# Patient Record
Sex: Male | Born: 1950
Health system: Southern US, Community
[De-identification: ages and names within clinical notes are randomized; demographics above are authoritative.]

## PROBLEM LIST (undated history)

## (undated) DIAGNOSIS — Z923 Personal history of irradiation: Secondary | ICD-10-CM

## (undated) DIAGNOSIS — R3915 Urgency of urination: Secondary | ICD-10-CM

## (undated) DIAGNOSIS — Z859 Personal history of malignant neoplasm, unspecified: Secondary | ICD-10-CM

## (undated) DIAGNOSIS — Z973 Presence of spectacles and contact lenses: Secondary | ICD-10-CM

## (undated) DIAGNOSIS — N201 Calculus of ureter: Secondary | ICD-10-CM

## (undated) DIAGNOSIS — Z9889 Other specified postprocedural states: Secondary | ICD-10-CM

## (undated) DIAGNOSIS — E785 Hyperlipidemia, unspecified: Secondary | ICD-10-CM

## (undated) DIAGNOSIS — M1A9XX0 Chronic gout, unspecified, without tophus (tophi): Secondary | ICD-10-CM

## (undated) DIAGNOSIS — Z87442 Personal history of urinary calculi: Secondary | ICD-10-CM

## (undated) DIAGNOSIS — N529 Male erectile dysfunction, unspecified: Secondary | ICD-10-CM

## (undated) DIAGNOSIS — I10 Essential (primary) hypertension: Secondary | ICD-10-CM

## (undated) DIAGNOSIS — K219 Gastro-esophageal reflux disease without esophagitis: Secondary | ICD-10-CM

## (undated) HISTORY — DX: Essential (primary) hypertension: I10

---

## 2014-01-18 ENCOUNTER — Telehealth: Payer: Self-pay

## 2014-01-18 NOTE — Telephone Encounter (Signed)
Left message for call back Non-identifiable   New Patient

## 2014-01-19 ENCOUNTER — Encounter: Payer: Self-pay | Admitting: Family Medicine

## 2014-01-19 ENCOUNTER — Ambulatory Visit (INDEPENDENT_AMBULATORY_CARE_PROVIDER_SITE_OTHER): Payer: BC Managed Care – PPO | Admitting: Family Medicine

## 2014-01-19 ENCOUNTER — Encounter (INDEPENDENT_AMBULATORY_CARE_PROVIDER_SITE_OTHER): Payer: Self-pay

## 2014-01-19 VITALS — BP 140/70 | HR 64 | Temp 98.4°F | Ht 71.0 in | Wt 214.0 lb

## 2014-01-19 DIAGNOSIS — N529 Male erectile dysfunction, unspecified: Secondary | ICD-10-CM

## 2014-01-19 DIAGNOSIS — I10 Essential (primary) hypertension: Secondary | ICD-10-CM

## 2014-01-19 DIAGNOSIS — M109 Gout, unspecified: Secondary | ICD-10-CM

## 2014-01-19 DIAGNOSIS — R0683 Snoring: Secondary | ICD-10-CM

## 2014-01-19 DIAGNOSIS — R0989 Other specified symptoms and signs involving the circulatory and respiratory systems: Secondary | ICD-10-CM

## 2014-01-19 DIAGNOSIS — Z0000 Encounter for general adult medical examination without abnormal findings: Secondary | ICD-10-CM

## 2014-01-19 DIAGNOSIS — R0609 Other forms of dyspnea: Secondary | ICD-10-CM

## 2014-01-19 DIAGNOSIS — L719 Rosacea, unspecified: Secondary | ICD-10-CM

## 2014-01-19 HISTORY — DX: Essential (primary) hypertension: I10

## 2014-01-19 HISTORY — DX: Gout, unspecified: M10.9

## 2014-01-19 LAB — BASIC METABOLIC PANEL
BUN: 15 mg/dL (ref 6–23)
CHLORIDE: 103 meq/L (ref 96–112)
CO2: 29 mEq/L (ref 19–32)
CREATININE: 0.9 mg/dL (ref 0.4–1.5)
Calcium: 9.1 mg/dL (ref 8.4–10.5)
GFR: 93.59 mL/min (ref >60.00)
Glucose, Bld: 103 mg/dL — ABNORMAL HIGH (ref 70–99)
Potassium: 3.4 mEq/L — ABNORMAL LOW (ref 3.5–5.1)
SODIUM: 137 meq/L (ref 135–145)

## 2014-01-19 LAB — URIC ACID: URIC ACID, SERUM: 6.1 mg/dL (ref 4.0–7.8)

## 2014-01-19 MED ORDER — FINACEA PLUS 15 % EX KIT: PACK | CUTANEOUS | Status: DC

## 2014-01-19 MED ORDER — MELOXICAM 15 MG PO TABS: 15.0000 mg | ORAL_TABLET | Freq: Every day | ORAL | Status: DC

## 2014-01-19 MED ORDER — COLCHICINE 0.6 MG PO TABS: 0.6000 mg | ORAL_TABLET | Freq: Every day | ORAL | Status: DC

## 2014-01-19 MED ORDER — SILDENAFIL CITRATE 100 MG PO TABS: 50.0000 mg | ORAL_TABLET | Freq: Every day | ORAL | Status: DC | PRN

## 2014-01-19 MED ORDER — VALSARTAN 160 MG PO TABS: 160.0000 mg | ORAL_TABLET | Freq: Every day | ORAL | Status: DC

## 2014-01-19 NOTE — Patient Instructions (Signed)

## 2014-01-19 NOTE — Telephone Encounter (Signed)
Unable to reach pre visit.  

## 2014-01-19 NOTE — Progress Notes (Signed)
Subjective:    Thomas [name] is a 63 y.o. male who presents with right elbow pain. Onset of the symptoms was several days ago. Inciting event: none known-- has a hx of gout. Current symptoms include: redness and swelling. Pain is aggravated by: nothing in particular. Symptoms have gradually improved. Patient has had prior elbow problems--gout. Evaluation to date: none. Treatment to date: OTC analgesics.  Pt also concerned about bp. It has been running high at home.  No cp, sob, palp.    He also has a hx of rosecea-- on med that seems to work but would like to see derm.  His wife is also c/o about him snoring and is concerned about sleep apnea.    The following portions of the patient's history were reviewed and updated as appropriate:  Past Medical History  Diagnosis Date  . Gout   . Hypertension    History   Social History  . Marital Status: Unknown    Spouse Name: N/A    Number of Children: N/A  . Years of Education: N/A   Occupational History  .      [employer]   Social History Main Topics  . Smoking status: Never Smoker   . Smokeless tobacco: Not on file  . Alcohol Use: Yes  . Drug Use: No  . Sexual Activity: Yes   Other Topics Concern  . Not on file   Social History Narrative   No exercise   No current outpatient prescriptions on file prior to visit.   No current facility-administered medications on file prior to visit.   Family History  Problem Relation Age of Onset  . Heart disease Mother 66  . Alcohol abuse Mother   . Depression Mother   . Cancer Sister     hodgkins, breast  . Hypertension Father    Not on File   Review of Systems Pertinent items are noted in HPI.   Objective:    BP 140/70  Pulse 64  Temp(Src) 98.4 F (36.9 C) (Oral)  Ht $R'5\' 11"'TP$  (1.803 m)  Wt 214 lb (97.07 kg)  BMI 29.86 kg/m2  SpO2 99% Right elbow: redness, warmth, swelling present and tender to touch  Left elbow:  without deformity  Cor-- +S1S2  No  murmur Lungs--CTAB/L  No rrw X-ray right elbow: not indicated   Assessment:    right elbow gout     Plan:    Rest, ice, compression, and elevation (RICE) therapy. NSAIDs per medication orders. colcrys, check uric acid   1. Gout Colcrys,  otc nsaid, check labs HO on diet given - Uric acid - Basic metabolic panel - colchicine (COLCRYS) 0.6 MG tablet; Take 1 tablet (0.6 mg total) by mouth daily.  Dispense: 30 tablet; Refill: 2 - meloxicam (MOBIC) 15 MG tablet; Take 1 tablet (15 mg total) by mouth daily. 1/2 -1 po qd prn  Dispense: 30 tablet; Refill: 2  2. HTN (hypertension) Slightly elevated today,  D/c cozaar and change to diovan - Basic metabolic panel - valsartan (DIOVAN) 160 MG tablet; Take 1 tablet (160 mg total) by mouth daily.  Dispense: 30 tablet; Refill: 2  3. Preventative health care  - Ambulatory referral to Gastroenterology  4. Acne rosacea  - Azelaic Acid-Cleanser-Lotion (FINACEA PLUS) 15 % KIT; As directed  Dispense: 1 kit; Refill: 5  5. Erectile dysfunction  - sildenafil (VIAGRA) 100 MG tablet; Take 0.5-1 tablets (50-100 mg total) by mouth daily as needed for erectile dysfunction.  Dispense: 5 tablet;  Refill: 11

## 2014-01-20 ENCOUNTER — Telehealth: Payer: Self-pay | Admitting: Family Medicine

## 2014-01-20 NOTE — Telephone Encounter (Signed)
Relevant patient education assigned to patient using Emmi. ° °

## 2014-02-09 ENCOUNTER — Encounter: Payer: Self-pay | Admitting: Family Medicine

## 2014-02-20 ENCOUNTER — Encounter: Payer: Self-pay | Admitting: Family Medicine

## 2014-02-27 ENCOUNTER — Encounter: Payer: Self-pay | Admitting: Family Medicine

## 2014-03-28 ENCOUNTER — Encounter: Payer: Self-pay | Admitting: Family Medicine

## 2014-04-19 ENCOUNTER — Other Ambulatory Visit: Payer: Self-pay | Admitting: Family Medicine

## 2014-04-20 NOTE — Telephone Encounter (Signed)
Rx sent to the pharmacy by e-script.//AB/CMA 

## 2014-05-08 ENCOUNTER — Telehealth: Payer: Self-pay

## 2014-05-08 NOTE — Telephone Encounter (Signed)
Medication List and allergies:  Updated and Reviewed  90 day supply/mail order: n/a Local prescriptions:  Recently moved and will be changing his preferred pharmacy.  Immunization due:  Td/tdap and Shingles  A/P: No changes to personal, family or PSH Tdap- DUE; last vaccine greater than 10 years ago per patient Shingles- DUE; has never received CCS- DUE; plans to schedule PSA- not on file.    To discuss with provider: Nothing at this time.

## 2014-05-09 ENCOUNTER — Other Ambulatory Visit: Payer: Self-pay | Admitting: Family Medicine

## 2014-05-09 ENCOUNTER — Other Ambulatory Visit: Payer: Self-pay

## 2014-05-09 ENCOUNTER — Encounter: Payer: Self-pay | Admitting: Family Medicine

## 2014-05-09 ENCOUNTER — Ambulatory Visit (INDEPENDENT_AMBULATORY_CARE_PROVIDER_SITE_OTHER): Payer: BC Managed Care – PPO | Admitting: Family Medicine

## 2014-05-09 VITALS — BP 130/70 | HR 68 | Temp 98.4°F | Ht 70.5 in | Wt 207.4 lb

## 2014-05-09 DIAGNOSIS — I10 Essential (primary) hypertension: Secondary | ICD-10-CM

## 2014-05-09 DIAGNOSIS — N63 Unspecified lump in unspecified breast: Secondary | ICD-10-CM

## 2014-05-09 DIAGNOSIS — Z0000 Encounter for general adult medical examination without abnormal findings: Secondary | ICD-10-CM

## 2014-05-09 DIAGNOSIS — M1A9XX0 Chronic gout, unspecified, without tophus (tophi): Secondary | ICD-10-CM

## 2014-05-09 DIAGNOSIS — Z85828 Personal history of other malignant neoplasm of skin: Secondary | ICD-10-CM

## 2014-05-09 DIAGNOSIS — Z23 Encounter for immunization: Secondary | ICD-10-CM

## 2014-05-09 DIAGNOSIS — M1A00X0 Idiopathic chronic gout, unspecified site, without tophus (tophi): Secondary | ICD-10-CM

## 2014-05-09 LAB — CBC WITH DIFFERENTIAL/PLATELET
BASOS ABS: 0 10*3/uL (ref 0.0–0.1)
Basophils Relative: 0.4 % (ref 0.0–3.0)
EOS ABS: 0.1 10*3/uL (ref 0.0–0.7)
Eosinophils Relative: 1.5 % (ref 0.0–5.0)
HEMATOCRIT: 48.1 % (ref 39.0–52.0)
HEMOGLOBIN: 16 g/dL (ref 13.0–17.0)
Lymphocytes Relative: 30.5 % (ref 12.0–46.0)
Lymphs Abs: 2.2 10*3/uL (ref 0.7–4.0)
MCHC: 33.3 g/dL (ref 30.0–36.0)
MCV: 86.7 fl (ref 78.0–100.0)
MONO ABS: 0.5 10*3/uL (ref 0.1–1.0)
Monocytes Relative: 6.7 % (ref 3.0–12.0)
NEUTROS ABS: 4.5 10*3/uL (ref 1.4–7.7)
Neutrophils Relative %: 60.9 % (ref 43.0–77.0)
Platelets: 240 10*3/uL (ref 150.0–400.0)
RBC: 5.55 Mil/uL (ref 4.22–5.81)
RDW: 14.3 % (ref 11.5–15.5)
WBC: 7.3 10*3/uL (ref 4.0–10.5)

## 2014-05-09 LAB — LIPID PANEL
Cholesterol: 189 mg/dL (ref 0–200)
HDL: 42.2 mg/dL (ref >39.00)
LDL Cholesterol: 136 mg/dL — ABNORMAL HIGH (ref 0–99)
NonHDL: 146.8
Total CHOL/HDL Ratio: 4
Triglycerides: 53 mg/dL (ref 0.0–149.0)
VLDL: 10.6 mg/dL (ref 0.0–40.0)

## 2014-05-09 LAB — URIC ACID: Uric Acid, Serum: 9 mg/dL — ABNORMAL HIGH (ref 4.0–7.8)

## 2014-05-09 LAB — POCT URINALYSIS DIPSTICK
BILIRUBIN UA: NEGATIVE
GLUCOSE UA: NEGATIVE
Ketones, UA: NEGATIVE
LEUKOCYTES UA: NEGATIVE
Nitrite, UA: NEGATIVE
PH UA: 6
Protein, UA: NEGATIVE
RBC UA: NEGATIVE
Spec Grav, UA: 1.01
Urobilinogen, UA: NEGATIVE

## 2014-05-09 LAB — HEPATIC FUNCTION PANEL
ALBUMIN: 4.7 g/dL (ref 3.5–5.2)
ALT: 65 U/L — ABNORMAL HIGH (ref 0–53)
AST: 45 U/L — ABNORMAL HIGH (ref 0–37)
Alkaline Phosphatase: 71 U/L (ref 39–117)
Bilirubin, Direct: 0.1 mg/dL (ref 0.0–0.3)
Total Bilirubin: 0.7 mg/dL (ref 0.2–1.2)
Total Protein: 7.4 g/dL (ref 6.0–8.3)

## 2014-05-09 LAB — BASIC METABOLIC PANEL
BUN: 19 mg/dL (ref 6–23)
CHLORIDE: 106 meq/L (ref 96–112)
CO2: 24 meq/L (ref 19–32)
Calcium: 9.2 mg/dL (ref 8.4–10.5)
Creatinine, Ser: 1 mg/dL (ref 0.4–1.5)
GFR: 80.67 mL/min (ref >60.00)
GLUCOSE: 89 mg/dL (ref 70–99)
POTASSIUM: 4.1 meq/L (ref 3.5–5.1)
SODIUM: 141 meq/L (ref 135–145)

## 2014-05-09 LAB — TSH: TSH: 0.65 u[IU]/mL (ref 0.35–4.50)

## 2014-05-09 MED ORDER — VALSARTAN 160 MG PO TABS: ORAL_TABLET | ORAL | Status: DC

## 2014-05-09 MED ORDER — ALLOPURINOL 100 MG PO TABS: 100.0000 mg | ORAL_TABLET | Freq: Every day | ORAL | Status: DC

## 2014-05-09 NOTE — Progress Notes (Signed)
Subjective:    Patient ID: Thomas [name], male    DOB: 02-05-1951, 63 y.o.   MRN: [id number]  HPI Pt here for cpe and labs.   No complaints.   Review of Systems  Constitutional: Negative.   HENT: Negative for congestion, ear pain, hearing loss, nosebleeds, postnasal drip, rhinorrhea, sinus pressure, sneezing and tinnitus.   Eyes: Negative for photophobia, discharge, itching and visual disturbance.  Respiratory: Negative.   Cardiovascular: Negative.   Gastrointestinal: Negative for abdominal pain, constipation, blood in stool, abdominal distention and anal bleeding.  Endocrine: Negative.   Genitourinary: Negative.   Musculoskeletal: Negative.   Skin: Negative.   Allergic/Immunologic: Negative.   Neurological: Negative for dizziness, weakness, light-headedness, numbness and headaches.  Psychiatric/Behavioral: Negative for suicidal ideas, confusion, sleep disturbance, dysphoric mood, decreased concentration and agitation. The patient is not nervous/anxious.    Past Medical History  Diagnosis Date  . Gout   . Hypertension    History   Social History  . Marital Status: Married    Spouse Name: N/A    Number of Children: N/A  . Years of Education: N/A   Occupational History  .      [employer]   Social History Main Topics  . Smoking status: Never Smoker   . Smokeless tobacco: Not on file  . Alcohol Use: Yes  . Drug Use: No  . Sexual Activity: Yes   Other Topics Concern  . Not on file   Social History Narrative   No exercise   Family History  Problem Relation Age of Onset  . Heart disease Mother 15  . Alcohol abuse Mother   . Depression Mother   . Cancer Sister     hodgkins, breast  . Hypertension Father    Current Outpatient Prescriptions  Medication Sig Dispense Refill  . Azelaic Acid-Cleanser-Lotion (FINACEA PLUS) 15 % KIT As directed  1 kit  5  . colchicine (COLCRYS) 0.6 MG tablet Take 1 tablet (0.6 mg total) by mouth daily.  30 tablet  2  .  meloxicam (MOBIC) 15 MG tablet Take 1 tablet (15 mg total) by mouth daily. 1/2 -1 po qd prn  30 tablet  2  . sildenafil (VIAGRA) 100 MG tablet Take 0.5-1 tablets (50-100 mg total) by mouth daily as needed for erectile dysfunction.  5 tablet  11  . valsartan (DIOVAN) 160 MG tablet TAKE 1 TABLET BY MOUTH ONCE DAILY  90 tablet  3   No current facility-administered medications for this visit.        Objective:   Physical Exam  Constitutional: He is oriented to person, place, and time. He appears well-developed and well-nourished. No distress.  HENT:  Head: Normocephalic and atraumatic.  Right Ear: External ear normal.  Left Ear: External ear normal.  Nose: Nose normal.  Mouth/Throat: Oropharynx is clear and moist. No oropharyngeal exudate.  Eyes: Conjunctivae and EOM are normal. Pupils are equal, round, and reactive to light. Right eye exhibits no discharge. Left eye exhibits no discharge.  Neck: Normal range of motion. Neck supple. No JVD present. No thyromegaly present.  Cardiovascular: Normal rate, regular rhythm and intact distal pulses.  Exam reveals no gallop and no friction rub.   No murmur heard. Pulmonary/Chest: Effort normal and breath sounds normal. No respiratory distress. He has no wheezes. He has no rales. He exhibits no tenderness.  Abdominal: Soft. Bowel sounds are normal. He exhibits no distension and no mass. There is no tenderness. There is no rebound and  no guarding.  Genitourinary: Rectum normal, prostate normal and penis normal. Guaiac negative stool.  Musculoskeletal: Normal range of motion. He exhibits no edema and no tenderness.  Lymphadenopathy:    He has no cervical adenopathy.  Neurological: He is alert and oriented to person, place, and time. He displays normal reflexes. He exhibits normal muscle tone.  Skin: Skin is warm and dry. No rash noted. He is not diaphoretic. No erythema. No pallor.  Psychiatric: He has a normal mood and affect. His behavior is normal.  Judgment and thought content normal.   Filed Vitals:   05/09/14 0854  BP: 130/70  Pulse: 68  Temp: 98.4 F (36.9 C)  TempSrc: Oral  Height: 5' 10.5" (1.791 m)  Weight: 207 lb 6.4 oz (94.076 kg)  SpO2: 98%          Assessment & Plan:  1. 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 Essential hypertension stable - Basic metabolic panel - valsartan (DIOVAN) 160 MG tablet; TAKE 1 TABLET BY MOUTH ONCE DAILY  Dispense: 90 tablet; Refill: 3

## 2014-05-09 NOTE — Addendum Note (Signed)
Addended by: [name] Schlein on: 05/09/2014 10:07 AM   Modules accepted: Orders

## 2014-05-09 NOTE — Patient Instructions (Signed)
Preventive Care for Adults A healthy lifestyle and preventive care can promote health and wellness. Preventive health guidelines for men include the following key practices:  A routine yearly physical is a good way to check with your health care provider about your health and preventative screening. It is a chance to share any concerns and updates on your health and to receive a thorough exam.  Visit your dentist for a routine exam and preventative care every 6 months. Brush your teeth twice a day and floss once a day. Good oral hygiene prevents tooth decay and gum disease.  The frequency of eye exams is based on your age, health, family medical history, use of contact lenses, and other factors. Follow your health care provider's recommendations for frequency of eye exams.  Eat a healthy diet. Foods such as vegetables, fruits, whole grains, low-fat dairy products, and lean protein foods contain the nutrients you need without too many calories. Decrease your intake of foods high in solid fats, added sugars, and salt. Eat the right amount of calories for you.Get information about a proper diet from your health care provider, if necessary.  Regular physical exercise is one of the most important things you can do for your health. Most adults should get at least 150 minutes of moderate-intensity exercise (any activity that increases your heart rate and causes you to sweat) each week. In addition, most adults need muscle-strengthening exercises on 2 or more days a week.  Maintain a healthy weight. The body mass index (BMI) is a screening tool to identify possible weight problems. It provides an estimate of body fat based on height and weight. Your health care provider can find your BMI and can help you achieve or maintain a healthy weight.For adults 20 years and older:  A BMI below 18.5 is considered underweight.  A BMI of 18.5 to 24.9 is normal.  A BMI of 25 to 29.9 is considered overweight.  A BMI  of 30 and above is considered obese.  Maintain normal blood lipids and cholesterol levels by exercising and minimizing your intake of saturated fat. Eat a balanced diet with plenty of fruit and vegetables. Blood tests for lipids and cholesterol should begin at age 50 and be repeated every 5 years. If your lipid or cholesterol levels are high, you are over 50, or you are at high risk for heart disease, you may need your cholesterol levels checked more frequently.Ongoing high lipid and cholesterol levels should be treated with medicines if diet and exercise are not working.  If you smoke, find out from your health care provider how to quit. If you do not use tobacco, do not start.  Lung cancer screening is recommended for adults aged 73-80 years who are at high risk for developing lung cancer because of a history of smoking. A yearly low-dose CT scan of the lungs is recommended for people who have at least a 30-pack-year history of smoking and are a current smoker or have quit within the past 15 years. A pack year of smoking is smoking an average of 1 pack of cigarettes a day for 1 year (for example: 1 pack a day for 30 years or 2 packs a day for 15 years). Yearly screening should continue until the smoker has stopped smoking for at least 15 years. Yearly screening should be stopped for people who develop a health problem that would prevent them from having lung cancer treatment.  If you choose to drink alcohol, do not have more than  2 drinks per day. One drink is considered to be 12 ounces (355 mL) of beer, 5 ounces (148 mL) of wine, or 1.5 ounces (44 mL) of liquor.  Avoid use of street drugs. Do not share needles with anyone. Ask for help if you need support or instructions about stopping the use of drugs.  High blood pressure causes heart disease and increases the risk of stroke. Your blood pressure should be checked at least every 1-2 years. Ongoing high blood pressure should be treated with  medicines, if weight loss and exercise are not effective.  If you are 45-79 years old, ask your health care provider if you should take aspirin to prevent heart disease.  Diabetes screening involves taking a blood sample to check your fasting blood sugar level. This should be done once every 3 years, after age 45, if you are within normal weight and without risk factors for diabetes. Testing should be considered at a younger age or be carried out more frequently if you are overweight and have at least 1 risk factor for diabetes.  Colorectal cancer can be detected and often prevented. Most routine colorectal cancer screening begins at the age of 50 and continues through age 75. However, your health care provider may recommend screening at an earlier age if you have risk factors for colon cancer. On a yearly basis, your health care provider may provide home test kits to check for hidden blood in the stool. Use of a small camera at the end of a tube to directly examine the colon (sigmoidoscopy or colonoscopy) can detect the earliest forms of colorectal cancer. Talk to your health care provider about this at age 50, when routine screening begins. Direct exam of the colon should be repeated every 5-10 years through age 75, unless early forms of precancerous polyps or small growths are found.  People who are at an increased risk for hepatitis B should be screened for this virus. You are considered at high risk for hepatitis B if:  You were born in a country where hepatitis B occurs often. Talk with your health care provider about which countries are considered high risk.  Your parents were born in a high-risk country and you have not received a shot to protect against hepatitis B (hepatitis B vaccine).  You have HIV or AIDS.  You use needles to inject street drugs.  You live with, or have sex with, someone who has hepatitis B.  You are a man who has sex with other men (MSM).  You get hemodialysis  treatment.  You take certain medicines for conditions such as cancer, organ transplantation, and autoimmune conditions.  Hepatitis C blood testing is recommended for all people born from 1945 through 1965 and any individual with known risks for hepatitis C.  Practice safe sex. Use condoms and avoid high-risk sexual practices to reduce the spread of sexually transmitted infections (STIs). STIs include gonorrhea, chlamydia, syphilis, trichomonas, herpes, HPV, and human immunodeficiency virus (HIV). Herpes, HIV, and HPV are viral illnesses that have no cure. They can result in disability, cancer, and death.  If you are at risk of being infected with HIV, it is recommended that you take a prescription medicine daily to prevent HIV infection. This is called preexposure prophylaxis (PrEP). You are considered at risk if:  You are a man who has sex with other men (MSM) and have other risk factors.  You are a heterosexual man, are sexually active, and are at increased risk for HIV infection.    You take drugs by injection.  You are sexually active with a partner who has HIV.  Talk with your health care provider about whether you are at high risk of being infected with HIV. If you choose to begin PrEP, you should first be tested for HIV. You should then be tested every 3 months for as long as you are taking PrEP.  A one-time screening for abdominal aortic aneurysm (AAA) and surgical repair of large AAAs by ultrasound are recommended for men ages 32 to 67 years who are current or former smokers.  Healthy men should no longer receive prostate-specific antigen (PSA) blood tests as part of routine cancer screening. Talk with your health care provider about prostate cancer screening.  Testicular cancer screening is not recommended for adult males who have no symptoms. Screening includes self-exam, a health care provider exam, and other screening tests. Consult with your health care provider about any symptoms  you have or any concerns you have about testicular cancer.  Use sunscreen. Apply sunscreen liberally and repeatedly throughout the day. You should seek shade when your shadow is shorter than you. Protect yourself by wearing long sleeves, pants, a wide-brimmed hat, and sunglasses year round, whenever you are outdoors.  Once a month, do a whole-body skin exam, using a mirror to look at the skin on your back. Tell your health care provider about new moles, moles that have irregular borders, moles that are larger than a pencil eraser, or moles that have changed in shape or color.  Stay current with required vaccines (immunizations).  Influenza vaccine. All adults should be immunized every year.  Tetanus, diphtheria, and acellular pertussis (Td, Tdap) vaccine. An adult who has not previously received Tdap or who does not know his vaccine status should receive 1 dose of Tdap. This initial dose should be followed by tetanus and diphtheria toxoids (Td) booster doses every 10 years. Adults with an unknown or incomplete history of completing a 3-dose immunization series with Td-containing vaccines should begin or complete a primary immunization series including a Tdap dose. Adults should receive a Td booster every 10 years.  Varicella vaccine. An adult without evidence of immunity to varicella should receive 2 doses or a second dose if he has previously received 1 dose.  Human papillomavirus (HPV) vaccine. Males aged 68-21 years who have not received the vaccine previously should receive the 3-dose series. Males aged 22-26 years may be immunized. Immunization is recommended through the age of 6 years for any male who has sex with males and did not get any or all doses earlier. Immunization is recommended for any person with an immunocompromised condition through the age of 49 years if he did not get any or all doses earlier. During the 3-dose series, the second dose should be obtained 4-8 weeks after the first  dose. The third dose should be obtained 24 weeks after the first dose and 16 weeks after the second dose.  Zoster vaccine. One dose is recommended for adults aged 50 years or older unless certain conditions are present.  Measles, mumps, and rubella (MMR) vaccine. Adults born before [birth year] generally are considered immune to measles and mumps. Adults born in [birth year] or later should have 1 or more doses of MMR vaccine unless there is a contraindication to the vaccine or there is laboratory evidence of immunity to each of the three diseases. A routine second dose of MMR vaccine should be obtained at least 28 days after the first dose for students attending postsecondary  schools, health care workers, or international travelers. People who received inactivated measles vaccine or an unknown type of measles vaccine during 1963-1967 should receive 2 doses of MMR vaccine. People who received inactivated mumps vaccine or an unknown type of mumps vaccine before 1979 and are at high risk for mumps infection should consider immunization with 2 doses of MMR vaccine. Unvaccinated health care workers born before 1957 who lack laboratory evidence of measles, mumps, or rubella immunity or laboratory confirmation of disease should consider measles and mumps immunization with 2 doses of MMR vaccine or rubella immunization with 1 dose of MMR vaccine.  Pneumococcal 13-valent conjugate (PCV13) vaccine. When indicated, a person who is uncertain of his immunization history and has no record of immunization should receive the PCV13 vaccine. An adult aged 19 years or older who has certain medical conditions and has not been previously immunized should receive 1 dose of PCV13 vaccine. This PCV13 should be followed with a dose of pneumococcal polysaccharide (PPSV23) vaccine. The PPSV23 vaccine dose should be obtained at least 8 weeks after the dose of PCV13 vaccine. An adult aged 19 years or older who has certain medical conditions and  previously received 1 or more doses of PPSV23 vaccine should receive 1 dose of PCV13. The PCV13 vaccine dose should be obtained 1 or more years after the last PPSV23 vaccine dose.  Pneumococcal polysaccharide (PPSV23) vaccine. When PCV13 is also indicated, PCV13 should be obtained first. All adults aged 65 years and older should be immunized. An adult younger than age 65 years who has certain medical conditions should be immunized. Any person who resides in a nursing home or long-term care facility should be immunized. An adult smoker should be immunized. People with an immunocompromised condition and certain other conditions should receive both PCV13 and PPSV23 vaccines. People with human immunodeficiency virus (HIV) infection should be immunized as soon as possible after diagnosis. Immunization during chemotherapy or radiation therapy should be avoided. Routine use of PPSV23 vaccine is not recommended for American Indians, [place] Natives, or people younger than 65 years unless there are medical conditions that require PPSV23 vaccine. When indicated, people who have unknown immunization and have no record of immunization should receive PPSV23 vaccine. One-time revaccination 5 years after the first dose of PPSV23 is recommended for people aged 19-64 years who have chronic kidney failure, nephrotic syndrome, asplenia, or immunocompromised conditions. People who received 1-2 doses of PPSV23 before age 65 years should receive another dose of PPSV23 vaccine at age 65 years or later if at least 5 years have passed since the previous dose. Doses of PPSV23 are not needed for people immunized with PPSV23 at or after age 65 years.  Meningococcal vaccine. Adults with asplenia or persistent complement component deficiencies should receive 2 doses of quadrivalent meningococcal conjugate (MenACWY-D) vaccine. The doses should be obtained at least 2 months apart. Microbiologists working with certain meningococcal bacteria,  military recruits, people at risk during an outbreak, and people who travel to or live in countries with a high rate of meningitis should be immunized. A first-year college student up through age 21 years who is living in a residence hall should receive a dose if he did not receive a dose on or after his 16th birthday. Adults who have certain high-risk conditions should receive one or more doses of vaccine.  Hepatitis A vaccine. Adults who wish to be protected from this disease, have certain high-risk conditions, work with hepatitis A-infected animals, work in hepatitis A research labs, or   travel to or work in countries with a high rate of hepatitis A should be immunized. Adults who were previously unvaccinated and who anticipate close contact with an international adoptee during the first 60 days after arrival in the [place] from a country with a high rate of hepatitis A should be immunized.  Hepatitis B vaccine. Adults should be immunized if they wish to be protected from this disease, have certain high-risk conditions, may be exposed to blood or other infectious body fluids, are household contacts or sex partners of hepatitis B positive people, are clients or workers in certain care facilities, or travel to or work in countries with a high rate of hepatitis B.  Haemophilus influenzae type b (Hib) vaccine. A previously unvaccinated person with asplenia or sickle cell disease or having a scheduled splenectomy should receive 1 dose of Hib vaccine. Regardless of previous immunization, a recipient of a hematopoietic stem cell transplant should receive a 3-dose series 6-12 months after his successful transplant. Hib vaccine is not recommended for adults with HIV infection. Preventive Service / Frequency Ages 52 to 17  Blood pressure check.** / Every 1 to 2 years.  Lipid and cholesterol check.** / Every 5 years beginning at age 69.  Hepatitis C blood test.** / For any individual with known risks for  hepatitis C.  Skin self-exam. / Monthly.  Influenza vaccine. / Every year.  Tetanus, diphtheria, and acellular pertussis (Tdap, Td) vaccine.** / Consult your health care provider. 1 dose of Td every 10 years.  Varicella vaccine.** / Consult your health care provider.  HPV vaccine. / 3 doses over 6 months, if 72 or younger.  Measles, mumps, rubella (MMR) vaccine.** / You need at least 1 dose of MMR if you were born in 1957 or later. You may also need a second dose.  Pneumococcal 13-valent conjugate (PCV13) vaccine.** / Consult your health care provider.  Pneumococcal polysaccharide (PPSV23) vaccine.** / 1 to 2 doses if you smoke cigarettes or if you have certain conditions.  Meningococcal vaccine.** / 1 dose if you are age 35 to 60 years and a Market researcher living in a residence hall, or have one of several medical conditions. You may also need additional booster doses.  Hepatitis A vaccine.** / Consult your health care provider.  Hepatitis B vaccine.** / Consult your health care provider.  Haemophilus influenzae type b (Hib) vaccine.** / Consult your health care provider. Ages 35 to 8  Blood pressure check.** / Every 1 to 2 years.  Lipid and cholesterol check.** / Every 5 years beginning at age 57.  Lung cancer screening. / Every year if you are aged 44-80 years and have a 30-pack-year history of smoking and currently smoke or have quit within the past 15 years. Yearly screening is stopped once you have quit smoking for at least 15 years or develop a health problem that would prevent you from having lung cancer treatment.  Fecal occult blood test (FOBT) of stool. / Every year beginning at age 55 and continuing until age 73. You may not have to do this test if you get a colonoscopy every 10 years.  Flexible sigmoidoscopy** or colonoscopy.** / Every 5 years for a flexible sigmoidoscopy or every 10 years for a colonoscopy beginning at age 28 and continuing until age  1.  Hepatitis C blood test.** / For all people born from [birth year] through 1965 and any individual with known risks for hepatitis C.  Skin self-exam. / Monthly.  Influenza vaccine. / Every  year.  Tetanus, diphtheria, and acellular pertussis (Tdap/Td) vaccine.** / Consult your health care provider. 1 dose of Td every 10 years.  Varicella vaccine.** / Consult your health care provider.  Zoster vaccine.** / 1 dose for adults aged 53 years or older.  Measles, mumps, rubella (MMR) vaccine.** / You need at least 1 dose of MMR if you were born in 1957 or later. You may also need a second dose.  Pneumococcal 13-valent conjugate (PCV13) vaccine.** / Consult your health care provider.  Pneumococcal polysaccharide (PPSV23) vaccine.** / 1 to 2 doses if you smoke cigarettes or if you have certain conditions.  Meningococcal vaccine.** / Consult your health care provider.  Hepatitis A vaccine.** / Consult your health care provider.  Hepatitis B vaccine.** / Consult your health care provider.  Haemophilus influenzae type b (Hib) vaccine.** / Consult your health care provider. Ages 77 and over  Blood pressure check.** / Every 1 to 2 years.  Lipid and cholesterol check.**/ Every 5 years beginning at age 85.  Lung cancer screening. / Every year if you are aged 55-80 years and have a 30-pack-year history of smoking and currently smoke or have quit within the past 15 years. Yearly screening is stopped once you have quit smoking for at least 15 years or develop a health problem that would prevent you from having lung cancer treatment.  Fecal occult blood test (FOBT) of stool. / Every year beginning at age 33 and continuing until age 11. You may not have to do this test if you get a colonoscopy every 10 years.  Flexible sigmoidoscopy** or colonoscopy.** / Every 5 years for a flexible sigmoidoscopy or every 10 years for a colonoscopy beginning at age 28 and continuing until age 73.  Hepatitis C blood  test.** / For all people born from [birth year] through 1965 and any individual with known risks for hepatitis C.  Abdominal aortic aneurysm (AAA) screening.** / A one-time screening for ages 50 to 27 years who are current or former smokers.  Skin self-exam. / Monthly.  Influenza vaccine. / Every year.  Tetanus, diphtheria, and acellular pertussis (Tdap/Td) vaccine.** / 1 dose of Td every 10 years.  Varicella vaccine.** / Consult your health care provider.  Zoster vaccine.** / 1 dose for adults aged 34 years or older.  Pneumococcal 13-valent conjugate (PCV13) vaccine.** / Consult your health care provider.  Pneumococcal polysaccharide (PPSV23) vaccine.** / 1 dose for all adults aged 63 years and older.  Meningococcal vaccine.** / Consult your health care provider.  Hepatitis A vaccine.** / Consult your health care provider.  Hepatitis B vaccine.** / Consult your health care provider.  Haemophilus influenzae type b (Hib) vaccine.** / Consult your health care provider. **Family history and personal history of risk and conditions may change your health care provider's recommendations. Document Released: 12/30/2001 Document Revised: 11/08/2013 Document Reviewed: 03/31/2011 [facility] Patient Information 2015 [place], [place]. This information is not intended to replace advice given to you by your health care provider. Make sure you discuss any questions you have with your health care provider.

## 2014-05-09 NOTE — Progress Notes (Signed)
Pre visit review using our clinic review tool, if applicable. No additional management support is needed unless otherwise documented below in the visit note. 

## 2014-05-11 ENCOUNTER — Encounter: Payer: Self-pay | Admitting: Gastroenterology

## 2014-05-25 ENCOUNTER — Ambulatory Visit
Admission: RE | Admit: 2014-05-25 | Discharge: 2014-05-25 | Disposition: A | Discharge status: Home / Self Care | Payer: BC Managed Care – PPO | Source: Ambulatory Visit | Attending: Family Medicine | Admitting: Family Medicine

## 2014-05-25 DIAGNOSIS — N63 Unspecified lump in unspecified breast: Secondary | ICD-10-CM

## 2014-07-18 ENCOUNTER — Ambulatory Visit (AMBULATORY_SURGERY_CENTER): Payer: Self-pay

## 2014-07-18 VITALS — Ht 71.0 in | Wt 210.0 lb

## 2014-07-18 DIAGNOSIS — Z1211 Encounter for screening for malignant neoplasm of colon: Secondary | ICD-10-CM

## 2014-07-18 MED ORDER — SUPREP BOWEL PREP KIT 17.5-3.13-1.6 GM/177ML PO SOLN: 1.0000 | Freq: Once | ORAL | Status: DC

## 2014-07-18 NOTE — Progress Notes (Signed)
No exposure to anesthesia  No allergies to eggs or soy No home oxygen No diet/weight loss meds  Has email  Emmi instructions given for colonoscopy

## 2014-07-20 ENCOUNTER — Encounter: Payer: Self-pay | Admitting: Gastroenterology

## 2014-07-28 ENCOUNTER — Encounter: Payer: BC Managed Care – PPO | Admitting: Gastroenterology

## 2014-09-21 ENCOUNTER — Encounter: Payer: BC Managed Care – PPO | Admitting: Gastroenterology

## 2014-10-17 ENCOUNTER — Ambulatory Visit (AMBULATORY_SURGERY_CENTER): Payer: Self-pay

## 2014-10-17 VITALS — Ht 71.0 in | Wt 215.8 lb

## 2014-10-17 DIAGNOSIS — Z1211 Encounter for screening for malignant neoplasm of colon: Secondary | ICD-10-CM

## 2014-10-17 MED ORDER — SUPREP BOWEL PREP KIT 17.5-3.13-1.6 GM/177ML PO SOLN: ORAL | Status: DC

## 2014-10-17 NOTE — Progress Notes (Signed)
Per pt, no allergies to soy or egg products.Pt not taking any weight loss meds or using  O2 at home. 

## 2014-10-30 ENCOUNTER — Encounter: Payer: Self-pay | Admitting: Gastroenterology

## 2014-10-30 ENCOUNTER — Ambulatory Visit (AMBULATORY_SURGERY_CENTER): Payer: BC Managed Care – PPO | Admitting: Gastroenterology

## 2014-10-30 VITALS — BP 127/72 | HR 82 | Temp 97.2°F | Resp 18 | Ht 71.0 in | Wt 215.0 lb

## 2014-10-30 DIAGNOSIS — Z1211 Encounter for screening for malignant neoplasm of colon: Secondary | ICD-10-CM

## 2014-10-30 MED ORDER — SODIUM CHLORIDE 0.9 % IV SOLN: 500.0000 mL | INTRAVENOUS | Status: DC

## 2014-10-30 NOTE — Progress Notes (Signed)
Report to PACU, RN, vss, BBS= Clear.  

## 2014-10-30 NOTE — Op Note (Signed)
[facility]  [address] [place], [id number]   COLONOSCOPY PROCEDURE REPORT  PATIENT: Thomas [name], Thomas [name]  MR#: [id number] BIRTHDATE: [birth year] , 63  yrs. old GENDER: male ENDOSCOPIST: Inda Castle, MD REFERRED LD:[id number] Amesti, DO PROCEDURE DATE:  10/30/2014 PROCEDURE:   Colonoscopy, diagnostic First Screening Colonoscopy - Avg.  risk and is 50 yrs.  old or older Yes.  Prior Negative Screening - Now for repeat screening. N/A  History of Adenoma - Now for follow-up colonoscopy & has been > or = to 3 yrs.  N/A  Polyps Removed Today? No.  Recommend repeat exam, <10 yrs? No. ASA CLASS:   Class II INDICATIONS:first colonoscopy and average risk for colon cancer. MEDICATIONS: Monitored anesthesia care and Propofol 200 mg IV  DESCRIPTION OF PROCEDURE:   After the risks benefits and alternatives of the procedure were thoroughly explained, informed consent was obtained.  The digital rectal exam revealed no abnormalities of the rectum.   The LB [id number]  endoscope was introduced through the anus and advanced to the terminal ileum which was intubated for a short distance. No adverse events experienced.   The quality of the prep was excellent using Suprep The instrument was then slowly withdrawn as the colon was fully examined.      COLON FINDINGS: There was moderate diverticulosis noted in the descending colon and sigmoid colon.   The examination was otherwise normal.  Retroflexed views revealed no abnormalities. The time to cecum=1 minutes 56 seconds.  Withdrawal time=7 minutes 28 seconds. The scope was withdrawn and the procedure completed. COMPLICATIONS: There were no immediate complications.  ENDOSCOPIC IMPRESSION: 1.   Moderate diverticulosis was noted in the descending colon and sigmoid colon 2.   The examination was otherwise normal  RECOMMENDATIONS: Continue current colorectal screening recommendations for "routine risk" patients with a repeat  colonoscopy in 10 years.  eSigned:  Inda Castle, MD 10/30/2014 9:18 AM   cc:

## 2014-10-30 NOTE — Patient Instructions (Signed)

## 2014-10-30 NOTE — Progress Notes (Signed)
CRNA is aware of the BP, and said to proceed.

## 2014-10-31 ENCOUNTER — Telehealth: Payer: Self-pay

## 2014-10-31 NOTE — Telephone Encounter (Signed)
No answer or vm to leave message.

## 2014-11-07 ENCOUNTER — Telehealth: Payer: Self-pay | Admitting: Family Medicine

## 2014-11-07 NOTE — Telephone Encounter (Signed)
Patient would like to switch from [facility] to [facility] b/c they have moved closer to this location.  Please advise.

## 2014-11-07 NOTE — Telephone Encounter (Signed)
Pt would like to switch to dr Regis Bill from dr [facility]. Pt does not want to drive to [facility]. Pt son zachary age 61 would like to switch as well

## 2014-11-07 NOTE — Telephone Encounter (Signed)
That is fine 

## 2014-11-07 NOTE — Telephone Encounter (Signed)
Because of   Capacity  Load of patient panel, I  And not currently taking  Adult patient transfers  Unless  I am seeing the family.  Please advise if I am seeing others in family  . If not then see if  other providers may be able to see him . Otherwise he can ask again in  6 months

## 2014-11-08 ENCOUNTER — Encounter: Payer: Self-pay | Admitting: Family Medicine

## 2014-11-08 ENCOUNTER — Ambulatory Visit: Payer: BC Managed Care – PPO | Admitting: Medical

## 2014-11-08 ENCOUNTER — Ambulatory Visit: Payer: BC Managed Care – PPO

## 2014-11-08 ENCOUNTER — Ambulatory Visit (INDEPENDENT_AMBULATORY_CARE_PROVIDER_SITE_OTHER): Payer: BC Managed Care – PPO | Admitting: Family Medicine

## 2014-11-08 VITALS — BP 168/80 | HR 66 | Temp 98.3°F | Resp 16

## 2014-11-08 DIAGNOSIS — J069 Acute upper respiratory infection, unspecified: Secondary | ICD-10-CM | POA: Insufficient documentation

## 2014-11-08 HISTORY — DX: Acute upper respiratory infection, unspecified: J06.9

## 2014-11-08 MED ORDER — PROMETHAZINE-DM 6.25-15 MG/5ML PO SYRP: 5.0000 mL | ORAL_SOLUTION | Freq: Four times a day (QID) | ORAL | Status: DC | PRN

## 2014-11-08 MED ORDER — BENZONATATE 200 MG PO CAPS: 200.0000 mg | ORAL_CAPSULE | Freq: Three times a day (TID) | ORAL | Status: DC | PRN

## 2014-11-08 NOTE — Patient Instructions (Signed)
Follow up as needed Start the cough syrup as needed for cough and congestion- will cause drowsiness Use the Tessalon cough pills in conjunction with the syrup and Mucinex DM (for daytime cough) Drink plenty of fluids REST! Ibuprofen prior to bed for cough and fever Call with any questions or concerns Happy Holidays!!!

## 2014-11-08 NOTE — Telephone Encounter (Signed)
lmom for pt to cb

## 2014-11-08 NOTE — Assessment & Plan Note (Signed)
Pt's sxs and PE consistent w/ viral illness- no evidence of bacterial infxn on exam.  No need for abx.  Cough meds prn.  Reviewed supportive care and red flags that should prompt return.  Pt expressed understanding and is in agreement w/ plan.

## 2014-11-08 NOTE — Progress Notes (Signed)
Pre visit review using our clinic review tool, if applicable. No additional management support is needed unless otherwise documented below in the visit note. 

## 2014-11-08 NOTE — Progress Notes (Signed)
   Subjective:    Patient ID: Thomas [name], male    DOB: August 22, 1951, 63 y.o.   MRN: [id number]  HPI URI- 'phlegmy cough, particularly at night'.  sxs started ~4 days ago.  Low grade fevers.  Minimal sinus pain.  + nasal congestion.  sxs are predominately in the chest and related to cough.  No ear pain, sore throat.  Initial body aches have resolved.  Waking in cold sweats at night.  + sick contacts.   Review of Systems For ROS see HPI     Objective:   Physical Exam  Constitutional: He appears well-developed and well-nourished. No distress.  HENT:  Head: Normocephalic and atraumatic.  Right Ear: Tympanic membrane normal.  Left Ear: Tympanic membrane normal.  Nose: No mucosal edema or rhinorrhea. Right sinus exhibits no maxillary sinus tenderness and no frontal sinus tenderness. Left sinus exhibits no maxillary sinus tenderness and no frontal sinus tenderness.  Mouth/Throat: Mucous membranes are normal. No oropharyngeal exudate, posterior oropharyngeal edema or posterior oropharyngeal erythema.  Eyes: Conjunctivae and EOM are normal. Pupils are equal, round, and reactive to light.  Neck: Normal range of motion. Neck supple.  Cardiovascular: Normal rate, regular rhythm and normal heart sounds.   Pulmonary/Chest: Effort normal and breath sounds normal. No respiratory distress. He has no wheezes.  + hacking cough  Lymphadenopathy:    He has no cervical adenopathy.  Skin: Skin is warm and dry.  Vitals reviewed.         Assessment & Plan:

## 2014-11-14 NOTE — Telephone Encounter (Signed)
Fine with me

## 2014-11-14 NOTE — Telephone Encounter (Signed)
lmom for pt to cb

## 2014-11-15 NOTE — Telephone Encounter (Signed)
lmom for pt to cb

## 2014-12-20 ENCOUNTER — Other Ambulatory Visit: Payer: Self-pay

## 2014-12-20 DIAGNOSIS — N529 Male erectile dysfunction, unspecified: Secondary | ICD-10-CM

## 2014-12-20 MED ORDER — SILDENAFIL CITRATE 100 MG PO TABS: 50.0000 mg | ORAL_TABLET | Freq: Every day | ORAL | Status: DC | PRN

## 2014-12-21 ENCOUNTER — Encounter: Payer: Self-pay | Admitting: Family Medicine

## 2014-12-21 ENCOUNTER — Telehealth: Payer: Self-pay | Admitting: Family Medicine

## 2014-12-21 DIAGNOSIS — L719 Rosacea, unspecified: Secondary | ICD-10-CM

## 2014-12-21 MED ORDER — FINACEA PLUS 15 % EX KIT: PACK | CUTANEOUS | Status: DC

## 2014-12-21 NOTE — Telephone Encounter (Signed)
Caller name: Kayden, [place] Relation to pt: self  Call back number: [phone] Pharmacy: [id number] [address], [place] [phone] (Phone) [phone] (Fax)     Reason for call:  Pt requesting a refill Azelaic Acid-Cleanser-Lotion

## 2014-12-21 NOTE — Telephone Encounter (Signed)
Duplicate request. Rx sent.     KP

## 2014-12-25 MED ORDER — FINACEA PLUS 15 % EX KIT: PACK | CUTANEOUS | Status: DC

## 2014-12-25 NOTE — Addendum Note (Signed)
Addended by: [name] Schlein on: 12/25/2014 02:37 PM   Modules accepted: Orders

## 2014-12-25 NOTE — Telephone Encounter (Signed)
Patient states that his pharmacy did not receive this. Please resend to [facility] [address].

## 2014-12-25 NOTE — Telephone Encounter (Signed)
Rx re-sent   KP

## 2014-12-26 ENCOUNTER — Telehealth: Payer: Self-pay | Admitting: *Deleted

## 2014-12-26 NOTE — Telephone Encounter (Signed)
Prior authorization for finacea initiated. Awaiting determination from [facility]. JG//CMA

## 2014-12-28 NOTE — Telephone Encounter (Signed)
PA approved effective 12/26/2014 through 12/25/2015. JG//CMA

## 2015-05-16 ENCOUNTER — Telehealth: Payer: Self-pay | Admitting: Family Medicine

## 2015-05-16 NOTE — Telephone Encounter (Signed)
Pre visit letter mailed 05/16/15

## 2015-05-24 ENCOUNTER — Encounter: Payer: Self-pay | Admitting: Family Medicine

## 2015-06-04 ENCOUNTER — Ambulatory Visit (INDEPENDENT_AMBULATORY_CARE_PROVIDER_SITE_OTHER): Payer: BLUE CROSS/BLUE SHIELD | Admitting: Family Medicine

## 2015-06-04 ENCOUNTER — Encounter: Payer: Self-pay | Admitting: Family Medicine

## 2015-06-04 VITALS — BP 140/72 | HR 76 | Temp 98.5°F | Ht 71.0 in | Wt 214.6 lb

## 2015-06-04 DIAGNOSIS — L709 Acne, unspecified: Secondary | ICD-10-CM

## 2015-06-04 DIAGNOSIS — I10 Essential (primary) hypertension: Secondary | ICD-10-CM

## 2015-06-04 DIAGNOSIS — Z0000 Encounter for general adult medical examination without abnormal findings: Secondary | ICD-10-CM | POA: Diagnosis not present

## 2015-06-04 DIAGNOSIS — M109 Gout, unspecified: Secondary | ICD-10-CM | POA: Diagnosis not present

## 2015-06-04 DIAGNOSIS — N529 Male erectile dysfunction, unspecified: Secondary | ICD-10-CM

## 2015-06-04 MED ORDER — FINACEA 15 % EX GEL: CUTANEOUS | Status: DC

## 2015-06-04 MED ORDER — SILDENAFIL CITRATE 100 MG PO TABS: 50.0000 mg | ORAL_TABLET | Freq: Every day | ORAL | Status: DC | PRN

## 2015-06-04 MED ORDER — COLCHICINE 0.6 MG PO TABS: 0.6000 mg | ORAL_TABLET | Freq: Every day | ORAL | Status: DC

## 2015-06-04 MED ORDER — VALSARTAN 160 MG PO TABS: ORAL_TABLET | ORAL | Status: DC

## 2015-06-04 NOTE — Progress Notes (Signed)
Pre visit review using our clinic review tool, if applicable. No additional management support is needed unless otherwise documented below in the visit note. 

## 2015-06-04 NOTE — Patient Instructions (Signed)
Preventive Care for Adults A healthy lifestyle and preventive care can promote health and wellness. Preventive health guidelines for men include the following key practices:  A routine yearly physical is a good way to check with your health care provider about your health and preventative screening. It is a chance to share any concerns and updates on your health and to receive a thorough exam.  Visit your dentist for a routine exam and preventative care every 6 months. Brush your teeth twice a day and floss once a day. Good oral hygiene prevents tooth decay and gum disease.  The frequency of eye exams is based on your age, health, family medical history, use of contact lenses, and other factors. Follow your health care provider's recommendations for frequency of eye exams.  Eat a healthy diet. Foods such as vegetables, fruits, whole grains, low-fat dairy products, and lean protein foods contain the nutrients you need without too many calories. Decrease your intake of foods high in solid fats, added sugars, and salt. Eat the right amount of calories for you.Get information about a proper diet from your health care provider, if necessary.  Regular physical exercise is one of the most important things you can do for your health. Most adults should get at least 150 minutes of moderate-intensity exercise (any activity that increases your heart rate and causes you to sweat) each week. In addition, most adults need muscle-strengthening exercises on 2 or more days a week.  Maintain a healthy weight. The body mass index (BMI) is a screening tool to identify possible weight problems. It provides an estimate of body fat based on height and weight. Your health care provider can find your BMI and can help you achieve or maintain a healthy weight.For adults 20 years and older:  A BMI below 18.5 is considered underweight.  A BMI of 18.5 to 24.9 is normal.  A BMI of 25 to 29.9 is considered overweight.  A BMI  of 30 and above is considered obese.  Maintain normal blood lipids and cholesterol levels by exercising and minimizing your intake of saturated fat. Eat a balanced diet with plenty of fruit and vegetables. Blood tests for lipids and cholesterol should begin at age 50 and be repeated every 5 years. If your lipid or cholesterol levels are high, you are over 50, or you are at high risk for heart disease, you may need your cholesterol levels checked more frequently.Ongoing high lipid and cholesterol levels should be treated with medicines if diet and exercise are not working.  If you smoke, find out from your health care provider how to quit. If you do not use tobacco, do not start.  Lung cancer screening is recommended for adults aged 73-80 years who are at high risk for developing lung cancer because of a history of smoking. A yearly low-dose CT scan of the lungs is recommended for people who have at least a 30-pack-year history of smoking and are a current smoker or have quit within the past 15 years. A pack year of smoking is smoking an average of 1 pack of cigarettes a day for 1 year (for example: 1 pack a day for 30 years or 2 packs a day for 15 years). Yearly screening should continue until the smoker has stopped smoking for at least 15 years. Yearly screening should be stopped for people who develop a health problem that would prevent them from having lung cancer treatment.  If you choose to drink alcohol, do not have more than  2 drinks per day. One drink is considered to be 12 ounces (355 mL) of beer, 5 ounces (148 mL) of wine, or 1.5 ounces (44 mL) of liquor.  Avoid use of street drugs. Do not share needles with anyone. Ask for help if you need support or instructions about stopping the use of drugs.  High blood pressure causes heart disease and increases the risk of stroke. Your blood pressure should be checked at least every 1-2 years. Ongoing high blood pressure should be treated with  medicines, if weight loss and exercise are not effective.  If you are 45-79 years old, ask your health care provider if you should take aspirin to prevent heart disease.  Diabetes screening involves taking a blood sample to check your fasting blood sugar level. This should be done once every 3 years, after age 45, if you are within normal weight and without risk factors for diabetes. Testing should be considered at a younger age or be carried out more frequently if you are overweight and have at least 1 risk factor for diabetes.  Colorectal cancer can be detected and often prevented. Most routine colorectal cancer screening begins at the age of 50 and continues through age 75. However, your health care provider may recommend screening at an earlier age if you have risk factors for colon cancer. On a yearly basis, your health care provider may provide home test kits to check for hidden blood in the stool. Use of a small camera at the end of a tube to directly examine the colon (sigmoidoscopy or colonoscopy) can detect the earliest forms of colorectal cancer. Talk to your health care provider about this at age 50, when routine screening begins. Direct exam of the colon should be repeated every 5-10 years through age 75, unless early forms of precancerous polyps or small growths are found.  People who are at an increased risk for hepatitis B should be screened for this virus. You are considered at high risk for hepatitis B if:  You were born in a country where hepatitis B occurs often. Talk with your health care provider about which countries are considered high risk.  Your parents were born in a high-risk country and you have not received a shot to protect against hepatitis B (hepatitis B vaccine).  You have HIV or AIDS.  You use needles to inject street drugs.  You live with, or have sex with, someone who has hepatitis B.  You are a man who has sex with other men (MSM).  You get hemodialysis  treatment.  You take certain medicines for conditions such as cancer, organ transplantation, and autoimmune conditions.  Hepatitis C blood testing is recommended for all people born from 1945 through 1965 and any individual with known risks for hepatitis C.  Practice safe sex. Use condoms and avoid high-risk sexual practices to reduce the spread of sexually transmitted infections (STIs). STIs include gonorrhea, chlamydia, syphilis, trichomonas, herpes, HPV, and human immunodeficiency virus (HIV). Herpes, HIV, and HPV are viral illnesses that have no cure. They can result in disability, cancer, and death.  If you are at risk of being infected with HIV, it is recommended that you take a prescription medicine daily to prevent HIV infection. This is called preexposure prophylaxis (PrEP). You are considered at risk if:  You are a man who has sex with other men (MSM) and have other risk factors.  You are a heterosexual man, are sexually active, and are at increased risk for HIV infection.    You take drugs by injection.  You are sexually active with a partner who has HIV.  Talk with your health care provider about whether you are at high risk of being infected with HIV. If you choose to begin PrEP, you should first be tested for HIV. You should then be tested every 3 months for as long as you are taking PrEP.  A one-time screening for abdominal aortic aneurysm (AAA) and surgical repair of large AAAs by ultrasound are recommended for men ages 32 to 67 years who are current or former smokers.  Healthy men should no longer receive prostate-specific antigen (PSA) blood tests as part of routine cancer screening. Talk with your health care provider about prostate cancer screening.  Testicular cancer screening is not recommended for adult males who have no symptoms. Screening includes self-exam, a health care provider exam, and other screening tests. Consult with your health care provider about any symptoms  you have or any concerns you have about testicular cancer.  Use sunscreen. Apply sunscreen liberally and repeatedly throughout the day. You should seek shade when your shadow is shorter than you. Protect yourself by wearing long sleeves, pants, a wide-brimmed hat, and sunglasses year round, whenever you are outdoors.  Once a month, do a whole-body skin exam, using a mirror to look at the skin on your back. Tell your health care provider about new moles, moles that have irregular borders, moles that are larger than a pencil eraser, or moles that have changed in shape or color.  Stay current with required vaccines (immunizations).  Influenza vaccine. All adults should be immunized every year.  Tetanus, diphtheria, and acellular pertussis (Td, Tdap) vaccine. An adult who has not previously received Tdap or who does not know his vaccine status should receive 1 dose of Tdap. This initial dose should be followed by tetanus and diphtheria toxoids (Td) booster doses every 10 years. Adults with an unknown or incomplete history of completing a 3-dose immunization series with Td-containing vaccines should begin or complete a primary immunization series including a Tdap dose. Adults should receive a Td booster every 10 years.  Varicella vaccine. An adult without evidence of immunity to varicella should receive 2 doses or a second dose if he has previously received 1 dose.  Human papillomavirus (HPV) vaccine. Males aged 68-21 years who have not received the vaccine previously should receive the 3-dose series. Males aged 22-26 years may be immunized. Immunization is recommended through the age of 6 years for any male who has sex with males and did not get any or all doses earlier. Immunization is recommended for any person with an immunocompromised condition through the age of 49 years if he did not get any or all doses earlier. During the 3-dose series, the second dose should be obtained 4-8 weeks after the first  dose. The third dose should be obtained 24 weeks after the first dose and 16 weeks after the second dose.  Zoster vaccine. One dose is recommended for adults aged 50 years or older unless certain conditions are present.  Measles, mumps, and rubella (MMR) vaccine. Adults born before [birth year] generally are considered immune to measles and mumps. Adults born in [birth year] or later should have 1 or more doses of MMR vaccine unless there is a contraindication to the vaccine or there is laboratory evidence of immunity to each of the three diseases. A routine second dose of MMR vaccine should be obtained at least 28 days after the first dose for students attending postsecondary  schools, health care workers, or international travelers. People who received inactivated measles vaccine or an unknown type of measles vaccine during 1963-1967 should receive 2 doses of MMR vaccine. People who received inactivated mumps vaccine or an unknown type of mumps vaccine before 1979 and are at high risk for mumps infection should consider immunization with 2 doses of MMR vaccine. Unvaccinated health care workers born before 1957 who lack laboratory evidence of measles, mumps, or rubella immunity or laboratory confirmation of disease should consider measles and mumps immunization with 2 doses of MMR vaccine or rubella immunization with 1 dose of MMR vaccine.  Pneumococcal 13-valent conjugate (PCV13) vaccine. When indicated, a person who is uncertain of his immunization history and has no record of immunization should receive the PCV13 vaccine. An adult aged 19 years or older who has certain medical conditions and has not been previously immunized should receive 1 dose of PCV13 vaccine. This PCV13 should be followed with a dose of pneumococcal polysaccharide (PPSV23) vaccine. The PPSV23 vaccine dose should be obtained at least 8 weeks after the dose of PCV13 vaccine. An adult aged 19 years or older who has certain medical conditions and  previously received 1 or more doses of PPSV23 vaccine should receive 1 dose of PCV13. The PCV13 vaccine dose should be obtained 1 or more years after the last PPSV23 vaccine dose.  Pneumococcal polysaccharide (PPSV23) vaccine. When PCV13 is also indicated, PCV13 should be obtained first. All adults aged 65 years and older should be immunized. An adult younger than age 65 years who has certain medical conditions should be immunized. Any person who resides in a nursing home or long-term care facility should be immunized. An adult smoker should be immunized. People with an immunocompromised condition and certain other conditions should receive both PCV13 and PPSV23 vaccines. People with human immunodeficiency virus (HIV) infection should be immunized as soon as possible after diagnosis. Immunization during chemotherapy or radiation therapy should be avoided. Routine use of PPSV23 vaccine is not recommended for American Indians, [place] Natives, or people younger than 65 years unless there are medical conditions that require PPSV23 vaccine. When indicated, people who have unknown immunization and have no record of immunization should receive PPSV23 vaccine. One-time revaccination 5 years after the first dose of PPSV23 is recommended for people aged 19-64 years who have chronic kidney failure, nephrotic syndrome, asplenia, or immunocompromised conditions. People who received 1-2 doses of PPSV23 before age 65 years should receive another dose of PPSV23 vaccine at age 65 years or later if at least 5 years have passed since the previous dose. Doses of PPSV23 are not needed for people immunized with PPSV23 at or after age 65 years.  Meningococcal vaccine. Adults with asplenia or persistent complement component deficiencies should receive 2 doses of quadrivalent meningococcal conjugate (MenACWY-D) vaccine. The doses should be obtained at least 2 months apart. Microbiologists working with certain meningococcal bacteria,  military recruits, people at risk during an outbreak, and people who travel to or live in countries with a high rate of meningitis should be immunized. A first-year college student up through age 21 years who is living in a residence hall should receive a dose if he did not receive a dose on or after his 16th birthday. Adults who have certain high-risk conditions should receive one or more doses of vaccine.  Hepatitis A vaccine. Adults who wish to be protected from this disease, have certain high-risk conditions, work with hepatitis A-infected animals, work in hepatitis A research labs, or   travel to or work in countries with a high rate of hepatitis A should be immunized. Adults who were previously unvaccinated and who anticipate close contact with an international adoptee during the first 60 days after arrival in the [place] from a country with a high rate of hepatitis A should be immunized.  Hepatitis B vaccine. Adults should be immunized if they wish to be protected from this disease, have certain high-risk conditions, may be exposed to blood or other infectious body fluids, are household contacts or sex partners of hepatitis B positive people, are clients or workers in certain care facilities, or travel to or work in countries with a high rate of hepatitis B.  Haemophilus influenzae type b (Hib) vaccine. A previously unvaccinated person with asplenia or sickle cell disease or having a scheduled splenectomy should receive 1 dose of Hib vaccine. Regardless of previous immunization, a recipient of a hematopoietic stem cell transplant should receive a 3-dose series 6-12 months after his successful transplant. Hib vaccine is not recommended for adults with HIV infection. Preventive Service / Frequency Ages 52 to 17  Blood pressure check.** / Every 1 to 2 years.  Lipid and cholesterol check.** / Every 5 years beginning at age 69.  Hepatitis C blood test.** / For any individual with known risks for  hepatitis C.  Skin self-exam. / Monthly.  Influenza vaccine. / Every year.  Tetanus, diphtheria, and acellular pertussis (Tdap, Td) vaccine.** / Consult your health care provider. 1 dose of Td every 10 years.  Varicella vaccine.** / Consult your health care provider.  HPV vaccine. / 3 doses over 6 months, if 72 or younger.  Measles, mumps, rubella (MMR) vaccine.** / You need at least 1 dose of MMR if you were born in 1957 or later. You may also need a second dose.  Pneumococcal 13-valent conjugate (PCV13) vaccine.** / Consult your health care provider.  Pneumococcal polysaccharide (PPSV23) vaccine.** / 1 to 2 doses if you smoke cigarettes or if you have certain conditions.  Meningococcal vaccine.** / 1 dose if you are age 35 to 60 years and a Market researcher living in a residence hall, or have one of several medical conditions. You may also need additional booster doses.  Hepatitis A vaccine.** / Consult your health care provider.  Hepatitis B vaccine.** / Consult your health care provider.  Haemophilus influenzae type b (Hib) vaccine.** / Consult your health care provider. Ages 35 to 8  Blood pressure check.** / Every 1 to 2 years.  Lipid and cholesterol check.** / Every 5 years beginning at age 57.  Lung cancer screening. / Every year if you are aged 44-80 years and have a 30-pack-year history of smoking and currently smoke or have quit within the past 15 years. Yearly screening is stopped once you have quit smoking for at least 15 years or develop a health problem that would prevent you from having lung cancer treatment.  Fecal occult blood test (FOBT) of stool. / Every year beginning at age 55 and continuing until age 73. You may not have to do this test if you get a colonoscopy every 10 years.  Flexible sigmoidoscopy** or colonoscopy.** / Every 5 years for a flexible sigmoidoscopy or every 10 years for a colonoscopy beginning at age 28 and continuing until age  1.  Hepatitis C blood test.** / For all people born from [birth year] through 1965 and any individual with known risks for hepatitis C.  Skin self-exam. / Monthly.  Influenza vaccine. / Every  year.  Tetanus, diphtheria, and acellular pertussis (Tdap/Td) vaccine.** / Consult your health care provider. 1 dose of Td every 10 years.  Varicella vaccine.** / Consult your health care provider.  Zoster vaccine.** / 1 dose for adults aged 53 years or older.  Measles, mumps, rubella (MMR) vaccine.** / You need at least 1 dose of MMR if you were born in 1957 or later. You may also need a second dose.  Pneumococcal 13-valent conjugate (PCV13) vaccine.** / Consult your health care provider.  Pneumococcal polysaccharide (PPSV23) vaccine.** / 1 to 2 doses if you smoke cigarettes or if you have certain conditions.  Meningococcal vaccine.** / Consult your health care provider.  Hepatitis A vaccine.** / Consult your health care provider.  Hepatitis B vaccine.** / Consult your health care provider.  Haemophilus influenzae type b (Hib) vaccine.** / Consult your health care provider. Ages 77 and over  Blood pressure check.** / Every 1 to 2 years.  Lipid and cholesterol check.**/ Every 5 years beginning at age 85.  Lung cancer screening. / Every year if you are aged 55-80 years and have a 30-pack-year history of smoking and currently smoke or have quit within the past 15 years. Yearly screening is stopped once you have quit smoking for at least 15 years or develop a health problem that would prevent you from having lung cancer treatment.  Fecal occult blood test (FOBT) of stool. / Every year beginning at age 33 and continuing until age 11. You may not have to do this test if you get a colonoscopy every 10 years.  Flexible sigmoidoscopy** or colonoscopy.** / Every 5 years for a flexible sigmoidoscopy or every 10 years for a colonoscopy beginning at age 28 and continuing until age 73.  Hepatitis C blood  test.** / For all people born from [birth year] through 1965 and any individual with known risks for hepatitis C.  Abdominal aortic aneurysm (AAA) screening.** / A one-time screening for ages 50 to 27 years who are current or former smokers.  Skin self-exam. / Monthly.  Influenza vaccine. / Every year.  Tetanus, diphtheria, and acellular pertussis (Tdap/Td) vaccine.** / 1 dose of Td every 10 years.  Varicella vaccine.** / Consult your health care provider.  Zoster vaccine.** / 1 dose for adults aged 34 years or older.  Pneumococcal 13-valent conjugate (PCV13) vaccine.** / Consult your health care provider.  Pneumococcal polysaccharide (PPSV23) vaccine.** / 1 dose for all adults aged 63 years and older.  Meningococcal vaccine.** / Consult your health care provider.  Hepatitis A vaccine.** / Consult your health care provider.  Hepatitis B vaccine.** / Consult your health care provider.  Haemophilus influenzae type b (Hib) vaccine.** / Consult your health care provider. **Family history and personal history of risk and conditions may change your health care provider's recommendations. Document Released: 12/30/2001 Document Revised: 11/08/2013 Document Reviewed: 03/31/2011 [facility] Patient Information 2015 [place], [place]. This information is not intended to replace advice given to you by your health care provider. Make sure you discuss any questions you have with your health care provider.

## 2015-06-04 NOTE — Progress Notes (Signed)
Patient ID: Thomas [name], male    DOB: 1950/12/10  Age: 64 y.o. MRN: [id number]    Subjective:  Subjective HPI Thomas [name] presents for cpe and labs.  No complaints.    Review of Systems  Constitutional: Negative.   HENT: Negative for congestion, ear pain, hearing loss, nosebleeds, postnasal drip, rhinorrhea, sinus pressure, sneezing and tinnitus.   Eyes: Negative for photophobia, discharge, itching and visual disturbance.  Respiratory: Negative.   Cardiovascular: Negative.   Gastrointestinal: Negative for abdominal pain, constipation, blood in stool, abdominal distention and anal bleeding.  Endocrine: Negative.   Genitourinary: Negative.   Musculoskeletal: Negative.   Skin: Negative.   Allergic/Immunologic: Negative.   Neurological: Negative for dizziness, weakness, light-headedness, numbness and headaches.  Psychiatric/Behavioral: Negative for suicidal ideas, confusion, sleep disturbance, dysphoric mood, decreased concentration and agitation. The patient is not nervous/anxious.     History Past Medical History  Diagnosis Date  . Gout   . Hypertension   . Cancer     skin/squamous cell    He has past surgical history that includes Wisdom tooth extraction.   His family history includes Alcohol abuse in his mother; Cancer in his sister; Depression in his mother; Heart disease (age of onset: 70) in his mother; Hypertension in his father. There is no history of Colon cancer, Pancreatic cancer, Rectal cancer, or Stomach cancer.He reports that he quit smoking about 20 years ago. His smoking use included Cigarettes. He has a 6 pack-year smoking history. He has never used smokeless tobacco. He reports that he drinks about 1.2 oz of alcohol per week. He reports that he does not use illicit drugs.  No current outpatient prescriptions on file prior to visit.   No current facility-administered medications on file prior to visit.     Objective:  Objective Physical Exam    Constitutional: He is oriented to person, place, and time. He appears well-developed and well-nourished. No distress.  HENT:  Head: Normocephalic and atraumatic.  Right Ear: External ear normal.  Left Ear: External ear normal.  Nose: Nose normal.  Mouth/Throat: Oropharynx is clear and moist. No oropharyngeal exudate.  Eyes: Conjunctivae and EOM are normal. Pupils are equal, round, and reactive to light. Right eye exhibits no discharge. Left eye exhibits no discharge.  Neck: Normal range of motion. Neck supple. No JVD present. No thyromegaly present.  Cardiovascular: Normal rate, regular rhythm and intact distal pulses.  Exam reveals no gallop and no friction rub.   No murmur heard. Pulmonary/Chest: Effort normal and breath sounds normal. No respiratory distress. He has no wheezes. He has no rales. He exhibits no tenderness.  Abdominal: Soft. Bowel sounds are normal. He exhibits no distension and no mass. There is no tenderness. There is no rebound and no guarding.  Genitourinary: Rectum normal, prostate normal and penis normal. Guaiac negative stool.  Musculoskeletal: Normal range of motion. He exhibits no edema or tenderness.  Lymphadenopathy:    He has no cervical adenopathy.  Neurological: He is alert and oriented to person, place, and time. He displays normal reflexes. He exhibits normal muscle tone.  Skin: Skin is warm and dry. No rash noted. He is not diaphoretic. No erythema. No pallor.  Psychiatric: He has a normal mood and affect. His behavior is normal. Judgment and thought content normal.   BP 140/72 mmHg  Pulse 76  Temp(Src) 98.5 F (36.9 C) (Oral)  Ht '5\' 11"'$  (1.803 m)  Wt 214 lb 9.6 oz (97.342 kg)  BMI 29.94 kg/m2  SpO2 98% Wt  Readings from Last 3 Encounters:  06/04/15 214 lb 9.6 oz (97.342 kg)  10/30/14 215 lb (97.523 kg)  10/17/14 215 lb 12.8 oz (97.886 kg)     Lab Results  Component Value Date   WBC 7.3 05/09/2014   HGB 16.0 05/09/2014   HCT 48.1 05/09/2014    PLT 240.0 05/09/2014   GLUCOSE 89 05/09/2014   CHOL 189 05/09/2014   TRIG 53.0 05/09/2014   HDL 42.20 05/09/2014   LDLCALC 136* 05/09/2014   ALT 65* 05/09/2014   AST 45* 05/09/2014   NA 141 05/09/2014   K 4.1 05/09/2014   CL 106 05/09/2014   CREATININE 1.0 05/09/2014   BUN 19 05/09/2014   CO2 24 05/09/2014   TSH 0.65 05/09/2014    Mm Digital Diagnostic Bilat  05/25/2014   CLINICAL DATA:  64 year old male with a palpable abnormality felt in the medial left breast by his clinician. The patient states this has been present for years and stable in size.  EXAM: DIGITAL DIAGNOSTIC  BILATERAL MAMMOGRAM WITH CAD  ULTRASOUND LEFT BREAST  COMPARISON:  No priors.  ACR Breast Density Category a: The breast tissue is almost entirely fatty.  FINDINGS: No suspicious masses or calcifications are seen in either breast. No mammographic abnormalities are seen at the site of palpable concern in the medial left breast.  Mammographic images were processed with CAD.  Physical examination at site of palpable concern in the medial left breast reveals a small soft mobile nodule.  Targeted ultrasound of the left breast was performed demonstrating an oval hyperechoic mass at 9 o'clock 4 cm from the nipple just beneath the skin surface measuring 0.3 x 0.2 x 0.3 cm. This may be related to a small lipoma. No suspicious findings were seen in the medial left breast.  IMPRESSION: Findings suggestive of a lipoma at site of palpable concern in the medial left breast. There is no mammographic evidence of malignancy in either breast.  RECOMMENDATION: Further decisions/ management of the palpable abnormality in the left breast she be based on clinical grounds.  I have discussed the findings and recommendations with the patient. Results were also provided in writing at the conclusion of the visit. If applicable, a reminder letter will be sent to the patient regarding the next appointment.  BI-RADS CATEGORY  2: Benign.   Electronically  Signed   By: Everlean Alstrom M.D.   On: 05/25/2014 16:18   US Breast Ltd Uni Left Inc Axilla  05/25/2014   CLINICAL DATA:  64 year old male with a palpable abnormality felt in the medial left breast by his clinician. The patient states this has been present for years and stable in size.  EXAM: DIGITAL DIAGNOSTIC  BILATERAL MAMMOGRAM WITH CAD  ULTRASOUND LEFT BREAST  COMPARISON:  No priors.  ACR Breast Density Category a: The breast tissue is almost entirely fatty.  FINDINGS: No suspicious masses or calcifications are seen in either breast. No mammographic abnormalities are seen at the site of palpable concern in the medial left breast.  Mammographic images were processed with CAD.  Physical examination at site of palpable concern in the medial left breast reveals a small soft mobile nodule.  Targeted ultrasound of the left breast was performed demonstrating an oval hyperechoic mass at 9 o'clock 4 cm from the nipple just beneath the skin surface measuring 0.3 x 0.2 x 0.3 cm. This may be related to a small lipoma. No suspicious findings were seen in the medial left breast.  IMPRESSION: Findings suggestive of  a lipoma at site of palpable concern in the medial left breast. There is no mammographic evidence of malignancy in either breast.  RECOMMENDATION: Further decisions/ management of the palpable abnormality in the left breast she be based on clinical grounds.  I have discussed the findings and recommendations with the patient. Results were also provided in writing at the conclusion of the visit. If applicable, a reminder letter will be sent to the patient regarding the next appointment.  BI-RADS CATEGORY  2: Benign.   Electronically Signed   By: Everlean Alstrom M.D.   On: 05/25/2014 16:18     Assessment & Plan:  Plan I have discontinued Mr. Gully promethazine-dextromethorphan, benzonatate, and FINACEA PLUS. I have also changed his [place]. Additionally, I am having him maintain his colchicine,  sildenafil, and valsartan.  Meds ordered this encounter  Medications  . DISCONTD: FINACEA 15 % cream    Sig: See admin instructions.    Refill:  5  . colchicine (COLCRYS) 0.6 MG tablet    Sig: Take 1 tablet (0.6 mg total) by mouth daily.    Dispense:  90 tablet    Refill:  3  . FINACEA 15 % cream    Sig: Use topical as directed    Dispense:  30 g    Refill:  5  . sildenafil (VIAGRA) 100 MG tablet    Sig: Take 0.5-1 tablets (50-100 mg total) by mouth daily as needed for erectile dysfunction.    Dispense:  5 tablet    Refill:  2  . valsartan (DIOVAN) 160 MG tablet    Sig: TAKE 1 TABLET BY MOUTH ONCE DAILY    Dispense:  90 tablet    Refill:  3    Problem List Items Addressed This Visit    HTN (hypertension)   Relevant Medications   sildenafil (VIAGRA) 100 MG tablet   valsartan (DIOVAN) 160 MG tablet   Other Relevant Orders   Basic metabolic panel   CBC with Differential/Platelet   Hepatic function panel   Lipid panel   POCT urinalysis dipstick   TSH   PSA   Uric acid   Gout - Primary   Relevant Medications   colchicine (COLCRYS) 0.6 MG tablet   Other Relevant Orders   Basic metabolic panel   CBC with Differential/Platelet   Hepatic function panel   Lipid panel   POCT urinalysis dipstick   TSH   PSA   Uric acid    Other Visit Diagnoses    Erectile dysfunction, unspecified erectile dysfunction type        Relevant Medications    sildenafil (VIAGRA) 100 MG tablet    Adult acne        Relevant Medications    FINACEA 15 % cream       Follow-up: Return in about 1 year (around 06/03/2016), or if symptoms worsen or fail to improve, for fasting, annual exam.  Garnet Koyanagi, DO

## 2015-06-30 ENCOUNTER — Other Ambulatory Visit: Payer: Self-pay | Admitting: Family Medicine

## 2015-07-14 ENCOUNTER — Encounter: Payer: Self-pay | Admitting: Family Medicine

## 2015-07-14 DIAGNOSIS — N529 Male erectile dysfunction, unspecified: Secondary | ICD-10-CM

## 2015-07-16 MED ORDER — SILDENAFIL CITRATE 100 MG PO TABS: 50.0000 mg | ORAL_TABLET | Freq: Every day | ORAL | Status: DC | PRN

## 2015-07-16 NOTE — Telephone Encounter (Signed)
Lab order is in the computer [place] [facility] to #10 on viagra

## 2015-07-28 ENCOUNTER — Other Ambulatory Visit: Payer: Self-pay | Admitting: Family Medicine

## 2015-08-27 ENCOUNTER — Encounter: Payer: Self-pay | Admitting: Family Medicine

## 2015-08-27 ENCOUNTER — Ambulatory Visit (INDEPENDENT_AMBULATORY_CARE_PROVIDER_SITE_OTHER): Payer: BC Managed Care – PPO | Admitting: Family Medicine

## 2015-08-27 ENCOUNTER — Telehealth: Payer: Self-pay

## 2015-08-27 ENCOUNTER — Ambulatory Visit (HOSPITAL_BASED_OUTPATIENT_CLINIC_OR_DEPARTMENT_OTHER)
Admission: RE | Admit: 2015-08-27 | Discharge: 2015-08-27 | Disposition: A | Discharge status: Home / Self Care | Payer: BC Managed Care – PPO | Source: Ambulatory Visit | Attending: Family Medicine | Admitting: Family Medicine

## 2015-08-27 VITALS — BP 164/78 | HR 85 | Temp 99.0°F | Wt 211.8 lb

## 2015-08-27 DIAGNOSIS — K5900 Constipation, unspecified: Secondary | ICD-10-CM

## 2015-08-27 DIAGNOSIS — R1030 Lower abdominal pain, unspecified: Secondary | ICD-10-CM | POA: Insufficient documentation

## 2015-08-27 DIAGNOSIS — R197 Diarrhea, unspecified: Secondary | ICD-10-CM

## 2015-08-27 DIAGNOSIS — R109 Unspecified abdominal pain: Secondary | ICD-10-CM | POA: Insufficient documentation

## 2015-08-27 DIAGNOSIS — R1084 Generalized abdominal pain: Secondary | ICD-10-CM

## 2015-08-27 HISTORY — DX: Lower abdominal pain, unspecified: R10.30

## 2015-08-27 LAB — CBC WITH DIFFERENTIAL/PLATELET
BASOS PCT: 0.4 % (ref 0.0–3.0)
Basophils Absolute: 0 10*3/uL (ref 0.0–0.1)
EOS PCT: 0.7 % (ref 0.0–5.0)
Eosinophils Absolute: 0.1 10*3/uL (ref 0.0–0.7)
HCT: 51.1 % (ref 39.0–52.0)
HEMOGLOBIN: 17 g/dL (ref 13.0–17.0)
LYMPHS ABS: 1.6 10*3/uL (ref 0.7–4.0)
Lymphocytes Relative: 14 % (ref 12.0–46.0)
MCHC: 33.3 g/dL (ref 30.0–36.0)
MCV: 87.1 fl (ref 78.0–100.0)
MONO ABS: 0.9 10*3/uL (ref 0.1–1.0)
Monocytes Relative: 8.2 % (ref 3.0–12.0)
NEUTROS PCT: 76.7 % (ref 43.0–77.0)
Neutro Abs: 8.9 10*3/uL — ABNORMAL HIGH (ref 1.4–7.7)
Platelets: 237 10*3/uL (ref 150.0–400.0)
RBC: 5.87 Mil/uL — ABNORMAL HIGH (ref 4.22–5.81)
RDW: 13.6 % (ref 11.5–15.5)
WBC: 11.6 10*3/uL — ABNORMAL HIGH (ref 4.0–10.5)

## 2015-08-27 LAB — COMPREHENSIVE METABOLIC PANEL
ALT: 35 U/L (ref 0–53)
AST: 23 U/L (ref 0–37)
Albumin: 4.3 g/dL (ref 3.5–5.2)
Alkaline Phosphatase: 83 U/L (ref 39–117)
BUN: 15 mg/dL (ref 6–23)
CO2: 28 meq/L (ref 19–32)
Calcium: 9.5 mg/dL (ref 8.4–10.5)
Chloride: 101 mEq/L (ref 96–112)
Creatinine, Ser: 0.97 mg/dL (ref 0.40–1.50)
GFR: 83.2 mL/min (ref >60.00)
GLUCOSE: 111 mg/dL — AB (ref 70–99)
POTASSIUM: 3.9 meq/L (ref 3.5–5.1)
SODIUM: 137 meq/L (ref 135–145)
Total Bilirubin: 0.7 mg/dL (ref 0.2–1.2)
Total Protein: 7.6 g/dL (ref 6.0–8.3)

## 2015-08-27 LAB — POCT URINALYSIS DIPSTICK
BILIRUBIN UA: NEGATIVE
Blood, UA: NEGATIVE
Glucose, UA: NEGATIVE
KETONES UA: NEGATIVE
Leukocytes, UA: NEGATIVE
Nitrite, UA: NEGATIVE
Protein, UA: NEGATIVE
Spec Grav, UA: >=1.03
Urobilinogen, UA: 0.2
pH, UA: 6

## 2015-08-27 NOTE — Telephone Encounter (Signed)
-----   Message from Rosalita Chessman, DO sent at 08/27/2015 12:56 PM EDT ----- You are consitpated  Use miralax over  of the counter. Drink  A lot of water  Call if symptoms do not improve

## 2015-08-27 NOTE — Telephone Encounter (Signed)
Refer to GI 

## 2015-08-27 NOTE — Progress Notes (Signed)
Pre visit review using our clinic review tool, if applicable. No additional management support is needed unless otherwise documented below in the visit note. 

## 2015-08-27 NOTE — Telephone Encounter (Signed)
i have tried to call the patient but the line was busy.     KP

## 2015-08-27 NOTE — Progress Notes (Signed)
Patient ID: Thomas [name], male    DOB: December 20, 1950  Age: 64 y.o. MRN: [id number]    Subjective:  Subjective HPI Thomas [name] presents for abd cramping and loose stools since Thursday am.  It has been progressively getting worse.  No fever.  No n/V.  No supect food.  No one else in house is sick.  Pt still has good appetite --- it has not effected his eating/ drinking.   He has had normal BM in between episodes of diarrhea.    Review of Systems  Constitutional: Negative for diaphoresis, appetite change, fatigue and unexpected weight change.  Eyes: Negative for pain, redness and visual disturbance.  Respiratory: Negative for cough, chest tightness, shortness of breath and wheezing.   Cardiovascular: Negative for chest pain, palpitations and leg swelling.  Gastrointestinal: Positive for abdominal pain, diarrhea and abdominal distention. Negative for nausea, vomiting, blood in stool and anal bleeding.  Endocrine: Negative for cold intolerance, heat intolerance, polydipsia, polyphagia and polyuria.  Genitourinary: Negative for dysuria, frequency, discharge, penile swelling, scrotal swelling, difficulty urinating and penile pain.  Neurological: Negative for dizziness, light-headedness, numbness and headaches.  All other systems reviewed and are negative.   History Past Medical History  Diagnosis Date  . Gout   . Hypertension   . Cancer ([place])     skin/squamous cell    He has past surgical history that includes Wisdom tooth extraction.   His family history includes Alcohol abuse in his mother; Cancer in his sister; Depression in his mother; Heart disease (age of onset: 58) in his mother; Hypertension in his father. There is no history of Colon cancer, Pancreatic cancer, Rectal cancer, or Stomach cancer.He reports that he quit smoking about 20 years ago. His smoking use included Cigarettes. He has a 6 pack-year smoking history. He has never used smokeless tobacco. He reports that he drinks about  1.2 oz of alcohol per week. He reports that he does not use illicit drugs.  Current Outpatient Prescriptions on File Prior to Visit  Medication Sig Dispense Refill  . colchicine (COLCRYS) 0.6 MG tablet Take 1 tablet (0.6 mg total) by mouth daily. 90 tablet 3  . FINACEA 15 % cream Use topical as directed 30 g 5  . sildenafil (VIAGRA) 100 MG tablet Take 0.5-1 tablets (50-100 mg total) by mouth daily as needed for erectile dysfunction. 10 tablet 2  . valsartan (DIOVAN) 160 MG tablet TAKE 1 TABLET BY MOUTH ONCE DAILY 90 tablet 3   No current facility-administered medications on file prior to visit.     Objective:  Objective Physical Exam  Constitutional: He is oriented to person, place, and time. Vital signs are normal. He appears well-developed and well-nourished. He is sleeping.  HENT:  Head: Normocephalic and atraumatic.  Mouth/Throat: Oropharynx is clear and moist.  Eyes: EOM are normal. Pupils are equal, round, and reactive to light.  Neck: Normal range of motion. Neck supple. No thyromegaly present.  Cardiovascular: Normal rate and regular rhythm.   No murmur heard. Pulmonary/Chest: Effort normal and breath sounds normal. No respiratory distress. He has no wheezes. He has no rales. He exhibits no tenderness.  Abdominal: Soft. Bowel sounds are normal. He exhibits no distension and no mass. There is no hepatosplenomegaly. There is tenderness. There is guarding. There is no rebound and no CVA tenderness. No hernia. Hernia confirmed negative in the ventral area, confirmed negative in the right inguinal area and confirmed negative in the left inguinal area.    Musculoskeletal: He exhibits no  edema or tenderness.  Neurological: He is alert and oriented to person, place, and time.  Skin: Skin is warm and dry.  Psychiatric: He has a normal mood and affect. His behavior is normal. Judgment and thought content normal.  Nursing note and vitals reviewed.  BP 164/78 mmHg  Pulse 85  Temp(Src)  99 F (37.2 C) (Oral)  Wt 211 lb 12.8 oz (96.072 kg)  SpO2 98% Wt Readings from Last 3 Encounters:  08/27/15 211 lb 12.8 oz (96.072 kg)  06/04/15 214 lb 9.6 oz (97.342 kg)  10/30/14 215 lb (97.523 kg)     Lab Results  Component Value Date   WBC 7.3 05/09/2014   HGB 16.0 05/09/2014   HCT 48.1 05/09/2014   PLT 240.0 05/09/2014   GLUCOSE 89 05/09/2014   CHOL 189 05/09/2014   TRIG 53.0 05/09/2014   HDL 42.20 05/09/2014   LDLCALC 136* 05/09/2014   ALT 65* 05/09/2014   AST 45* 05/09/2014   NA 141 05/09/2014   K 4.1 05/09/2014   CL 106 05/09/2014   CREATININE 1.0 05/09/2014   BUN 19 05/09/2014   CO2 24 05/09/2014   TSH 0.65 05/09/2014    Mm Digital Diagnostic Bilat  05/25/2014   CLINICAL DATA:  64 year old male with a palpable abnormality felt in the medial left breast by his clinician. The patient states this has been present for years and stable in size.  EXAM: DIGITAL DIAGNOSTIC  BILATERAL MAMMOGRAM WITH CAD  ULTRASOUND LEFT BREAST  COMPARISON:  No priors.  ACR Breast Density Category a: The breast tissue is almost entirely fatty.  FINDINGS: No suspicious masses or calcifications are seen in either breast. No mammographic abnormalities are seen at the site of palpable concern in the medial left breast.  Mammographic images were processed with CAD.  Physical examination at site of palpable concern in the medial left breast reveals a small soft mobile nodule.  Targeted ultrasound of the left breast was performed demonstrating an oval hyperechoic mass at 9 o'clock 4 cm from the nipple just beneath the skin surface measuring 0.3 x 0.2 x 0.3 cm. This may be related to a small lipoma. No suspicious findings were seen in the medial left breast.  IMPRESSION: Findings suggestive of a lipoma at site of palpable concern in the medial left breast. There is no mammographic evidence of malignancy in either breast.  RECOMMENDATION: Further decisions/ management of the palpable abnormality in the left  breast she be based on clinical grounds.  I have discussed the findings and recommendations with the patient. Results were also provided in writing at the conclusion of the visit. If applicable, a reminder letter will be sent to the patient regarding the next appointment.  BI-RADS CATEGORY  2: Benign.   Electronically Signed   By: Everlean Alstrom M.D.   On: 05/25/2014 16:18   US Breast Ltd Uni Left Inc Axilla  05/25/2014   CLINICAL DATA:  64 year old male with a palpable abnormality felt in the medial left breast by his clinician. The patient states this has been present for years and stable in size.  EXAM: DIGITAL DIAGNOSTIC  BILATERAL MAMMOGRAM WITH CAD  ULTRASOUND LEFT BREAST  COMPARISON:  No priors.  ACR Breast Density Category a: The breast tissue is almost entirely fatty.  FINDINGS: No suspicious masses or calcifications are seen in either breast. No mammographic abnormalities are seen at the site of palpable concern in the medial left breast.  Mammographic images were processed with CAD.  Physical examination at  site of palpable concern in the medial left breast reveals a small soft mobile nodule.  Targeted ultrasound of the left breast was performed demonstrating an oval hyperechoic mass at 9 o'clock 4 cm from the nipple just beneath the skin surface measuring 0.3 x 0.2 x 0.3 cm. This may be related to a small lipoma. No suspicious findings were seen in the medial left breast.  IMPRESSION: Findings suggestive of a lipoma at site of palpable concern in the medial left breast. There is no mammographic evidence of malignancy in either breast.  RECOMMENDATION: Further decisions/ management of the palpable abnormality in the left breast she be based on clinical grounds.  I have discussed the findings and recommendations with the patient. Results were also provided in writing at the conclusion of the visit. If applicable, a reminder letter will be sent to the patient regarding the next appointment.  BI-RADS  CATEGORY  2: Benign.   Electronically Signed   By: Everlean Alstrom M.D.   On: 05/25/2014 16:18     Assessment & Plan:  Plan I am having Thomas [name] maintain his colchicine, FINACEA, valsartan, and sildenafil.  No orders of the defined types were placed in this encounter.    Problem List Items Addressed This Visit    Lower abdominal pain - Primary    Check xray Check labs ? IBS       Relevant Orders   DG Abd 2 Views   Comp Met (CMET)   CBC with Differential/Platelet   POCT urinalysis dipstick      Follow-up: Return if symptoms worsen or fail to improve.  Garnet Koyanagi, DO

## 2015-08-27 NOTE — Telephone Encounter (Addendum)
Spoke with patient and he said the stool is watery and now having light red blood in the stool, he said the cramping is not any better. He said he would take the Miralax, but he doesn't feel like the constipation is the problem.      KP

## 2015-08-27 NOTE — Assessment & Plan Note (Signed)
Check xray Check labs ? IBS

## 2015-08-27 NOTE — Patient Instructions (Signed)

## 2015-08-28 NOTE — Telephone Encounter (Signed)
Unable to reach patient. Line still busy.

## 2015-08-29 NOTE — Telephone Encounter (Signed)
Patient has been made aware of the results and recommendations and voiced understanding, he said he is feeling much better now and does not want any other studies at this time. He will call if any issues.         KP

## 2015-11-18 HISTORY — PX: COLONOSCOPY WITH PROPOFOL: SHX5780

## 2015-12-11 ENCOUNTER — Ambulatory Visit (INDEPENDENT_AMBULATORY_CARE_PROVIDER_SITE_OTHER): Payer: BC Managed Care – PPO | Admitting: Family Medicine

## 2015-12-11 ENCOUNTER — Encounter: Payer: Self-pay | Admitting: Family Medicine

## 2015-12-11 VITALS — Ht 71.0 in | Wt 215.0 lb

## 2015-12-11 VITALS — BP 176/88 | HR 74 | Temp 98.3°F | Wt 217.6 lb

## 2015-12-11 DIAGNOSIS — M25519 Pain in unspecified shoulder: Secondary | ICD-10-CM | POA: Insufficient documentation

## 2015-12-11 DIAGNOSIS — M25511 Pain in right shoulder: Secondary | ICD-10-CM

## 2015-12-11 HISTORY — DX: Pain in unspecified shoulder: M25.519

## 2015-12-11 MED ORDER — METHYLPREDNISOLONE ACETATE 40 MG/ML IJ SUSP
40.0000 mg | Freq: Once | INTRAMUSCULAR | Status: AC
  Administered 2015-12-11: 40 mg via INTRA_ARTICULAR

## 2015-12-11 MED ORDER — HYDROCODONE-ACETAMINOPHEN 5-325 MG PO TABS: 1.0000 | ORAL_TABLET | Freq: Four times a day (QID) | ORAL | Status: DC | PRN

## 2015-12-11 MED FILL — HYDROCODON-APAP 5-325: 5-325 | 10 days supply | Qty: 40 | Fill #0

## 2015-12-11 NOTE — Progress Notes (Signed)
Pre visit review using our clinic review tool, if applicable. No additional management support is needed unless otherwise documented below in the visit note. 

## 2015-12-11 NOTE — Patient Instructions (Signed)

## 2015-12-11 NOTE — Progress Notes (Signed)
Patient ID: Thomas [name], male    DOB: [birth year]  Age: 65 y.o. MRN: [id number]    Subjective:  Subjective HPI [name] presents for R shoulder pain since before christmas.  No known injury. He was taking advil which helped in the beginning but now pain is worse.   He was moving [employer] but cannot think of anything in particular that caused it.    Review of Systems  Constitutional: Negative for diaphoresis, appetite change, fatigue and unexpected weight change.  Eyes: Negative for pain, redness and visual disturbance.  Respiratory: Negative for cough, chest tightness, shortness of breath and wheezing.   Cardiovascular: Negative for chest pain, palpitations and leg swelling.  Endocrine: Negative for cold intolerance, heat intolerance, polydipsia, polyphagia and polyuria.  Genitourinary: Negative for dysuria, frequency and difficulty urinating.  Neurological: Negative for dizziness, light-headedness, numbness and headaches.    History Past Medical History  Diagnosis Date  . Gout   . Hypertension   . Cancer ([place])     skin/squamous cell    He has past surgical history that includes Wisdom tooth extraction.   His family history includes Alcohol abuse in his mother; Cancer in his sister; Depression in his mother; Heart disease (age of onset: 57) in his mother; Hypertension in his father. There is no history of Colon cancer, Pancreatic cancer, Rectal cancer, or Stomach cancer.He reports that he quit smoking about 21 years ago. His smoking use included Cigarettes. He has a 6 pack-year smoking history. He has never used smokeless tobacco. He reports that he drinks about 1.2 oz of alcohol per week. He reports that he does not use illicit drugs.  Current Outpatient Prescriptions on File Prior to Visit  Medication Sig Dispense Refill  . colchicine (COLCRYS) 0.6 MG tablet Take 1 tablet (0.6 mg total) by mouth daily. 90 tablet 3  . FINACEA 15 % cream Use topical as directed 30 g  5  . sildenafil (VIAGRA) 100 MG tablet Take 0.5-1 tablets (50-100 mg total) by mouth daily as needed for erectile dysfunction. 10 tablet 2  . valsartan (DIOVAN) 160 MG tablet TAKE 1 TABLET BY MOUTH ONCE DAILY 90 tablet 3   No current facility-administered medications on file prior to visit.     Objective:  Objective Physical Exam  Constitutional: He is oriented to person, place, and time.  Musculoskeletal: He exhibits edema and tenderness.       Right shoulder: He exhibits decreased range of motion, tenderness and pain. He exhibits no bony tenderness, no swelling and no effusion.  Neurological: He is alert and oriented to person, place, and time. No cranial nerve deficit.  Psychiatric: He has a normal mood and affect. His behavior is normal. Judgment and thought content normal.  Nursing note and vitals reviewed.  BP 176/88 mmHg  Pulse 74  Temp(Src) 98.3 F (36.8 C) (Oral)  Wt 217 lb 9.6 oz (98.703 kg)  SpO2 98% Wt Readings from Last 3 Encounters:  12/11/15 217 lb 9.6 oz (98.703 kg)  08/27/15 211 lb 12.8 oz (96.072 kg)  06/04/15 214 lb 9.6 oz (97.342 kg)     Lab Results  Component Value Date   WBC 11.6* 08/27/2015   HGB 17.0 08/27/2015   HCT 51.1 08/27/2015   PLT 237.0 08/27/2015   GLUCOSE 111* 08/27/2015   CHOL 189 05/09/2014   TRIG 53.0 05/09/2014   HDL 42.20 05/09/2014   LDLCALC 136* 05/09/2014   ALT 35 08/27/2015   AST 23 08/27/2015   NA 137 08/27/2015  K 3.9 08/27/2015   CL 101 08/27/2015   CREATININE 0.97 08/27/2015   BUN 15 08/27/2015   CO2 28 08/27/2015   TSH 0.65 05/09/2014    Dg Abd 2 Views  08/27/2015  CLINICAL DATA:  Five days of abdominal pain and loose stools EXAM: ABDOMEN - 2 VIEW COMPARISON:  None in PACs FINDINGS: There is mildly increased colonic stool burden. There is no small or large bowel obstructive pattern. No free extraluminal gas collections are observed. There are no abnormal soft tissue calcifications. There is gentle dextrocurvature  centered the thoracolumbar junction which may be positional. IMPRESSION: Mildly increased stool burden within the ascending and transverse portions of the colon may reflect clinical constipation in the appropriate setting. There is no pattern of obstruction or gastroenteritis. Electronically Signed   By: David  Martinique M.D.   On: 08/27/2015 09:55     Assessment & Plan:  Plan I am having [name] maintain his colchicine, FINACEA, valsartan, and sildenafil.  No orders of the defined types were placed in this encounter.    Problem List Items Addressed This Visit      Unprioritized   Pain in joint, shoulder region - Primary   Relevant Orders   Ambulatory referral to Sports Medicine    pt refused sling Suspect Rotator cuff injury We were going to give him pain meds but sport med upstairs was willling to see him now.  Follow-up: No Follow-up on file.  Garnet Koyanagi, DO

## 2015-12-11 NOTE — Patient Instructions (Signed)
You have a frozen shoulder (adhesive capsulitis), a buildup of scar tissue that limits motion of the shoulder joint. Limit lifting and overhead activities as much as possible. Heat 15 minutes at a time 3-4 times a day may help with movement and stiffness. Tylenol and/or aleve as needed for pain and inflammation. Ok to take norco as needed for severe pain - no driving on this. Steroid injections in a series have been shown to help with pain and motion - you were given this today. Codman exercises (pendulum, wall walking or table slides, arm circles) - do 3 sets of 10 once or twice a day. Physical therapy for rotator cuff strengthening is a consideration once you are out of the painful phase Follow up in 1 month

## 2015-12-12 NOTE — Progress Notes (Signed)
PCP and consultation requested by: Garnet Koyanagi, DO  Subjective:   HPI: Patient is a 65 y.o. male here for right shoulder pain.  Patient reports he's had about 7 weeks of right shoulder pain. Slowly worsened since first week of December. + night pain. Decreased motion. Pain level 6/10. Tried advil. Pain is shooting, dull, constant. No skin changes, fever, other complaints.  Past Medical History  Diagnosis Date  . Gout   . Hypertension   . Cancer ([place])     skin/squamous cell    Current Outpatient Prescriptions on File Prior to Visit  Medication Sig Dispense Refill  . colchicine (COLCRYS) 0.6 MG tablet Take 1 tablet (0.6 mg total) by mouth daily. 90 tablet 3  . FINACEA 15 % cream Use topical as directed 30 g 5  . sildenafil (VIAGRA) 100 MG tablet Take 0.5-1 tablets (50-100 mg total) by mouth daily as needed for erectile dysfunction. 10 tablet 2  . valsartan (DIOVAN) 160 MG tablet TAKE 1 TABLET BY MOUTH ONCE DAILY 90 tablet 3   No current facility-administered medications on file prior to visit.    Past Surgical History  Procedure Laterality Date  . Wisdom tooth extraction      no sedation    No Known Allergies  Social History   Social History  . Marital Status: Married    Spouse Name: N/A  . Number of Children: N/A  . Years of Education: N/A   Occupational History  .      [employer]   Social History Main Topics  . Smoking status: Former Smoker -- 0.30 packs/day for 20 years    Types: Cigarettes    Quit date: 10/17/1994  . Smokeless tobacco: Never Used     Comment: quit 20 yrs ago  . Alcohol Use: 1.2 oz/week    2 Glasses of wine per week  . Drug Use: No  . Sexual Activity: Yes   Other Topics Concern  . Not on file   Social History Narrative   Exercise 1 hour a day ---3-4 days a week    Family History  Problem Relation Age of Onset  . Heart disease Mother 55  . Alcohol abuse Mother   . Depression Mother   . Cancer Sister    hodgkins, breast  . Hypertension Father   . Colon cancer Neg Hx   . Pancreatic cancer Neg Hx   . Rectal cancer Neg Hx   . Stomach cancer Neg Hx     Ht '5\' 11"'$  (1.803 m)  Wt 215 lb (97.523 kg)  BMI 30.00 kg/m2  Review of Systems: See HPI above.    Objective:  Physical Exam:  Gen: NAD  Right shoulder: No swelling, ecchymoses.  No gross deformity. No TTP. ER 30 degrees, abduction 90 degrees, full flexion.  All painful - cannot go further with passive motion. Positive Hawkins, Neers. Negative Speeds, Yergasons. Strength 5/5 with empty can and resisted internal/external rotation. NV intact distally.  Left shoulder: FROM without pain.    Assessment & Plan:  1. Right shoulder pain - 2/2 adhesive capsulitis.  Shown home exercises to do daily to regain motion.  Heat, tylenol/nsaids with norco as needed for severe pain.  Intraarticular injection given today.  Consider physical therapy.  F/u in 1 month.  After informed written consent, patient was seated on exam table. Right shoulder was prepped with alcohol swab and utilizing posterior approach, patient's right glenohumeral space was injected with 3:1 marcaine: depomedrol. Patient tolerated the procedure  well without immediate complications.

## 2015-12-12 NOTE — Assessment & Plan Note (Signed)
2/2 adhesive capsulitis.  Shown home exercises to do daily to regain motion.  Heat, tylenol/nsaids with norco as needed for severe pain.  Intraarticular injection given today.  Consider physical therapy.  F/u in 1 month.  After informed written consent, patient was seated on exam table. Right shoulder was prepped with alcohol swab and utilizing posterior approach, patient's right glenohumeral space was injected with 3:1 marcaine: depomedrol. Patient tolerated the procedure well without immediate complications.

## 2015-12-19 ENCOUNTER — Telehealth: Payer: Self-pay | Admitting: General Practice

## 2015-12-19 NOTE — Telephone Encounter (Signed)
PA began in covermymeds for Finacea Cream

## 2015-12-20 ENCOUNTER — Ambulatory Visit: Payer: Self-pay | Admitting: Family Medicine

## 2016-01-11 ENCOUNTER — Ambulatory Visit (INDEPENDENT_AMBULATORY_CARE_PROVIDER_SITE_OTHER): Payer: BC Managed Care – PPO | Admitting: Family Medicine

## 2016-01-11 ENCOUNTER — Encounter: Payer: Self-pay | Admitting: Family Medicine

## 2016-01-11 VITALS — Ht 71.0 in | Wt 210.0 lb

## 2016-01-11 DIAGNOSIS — M25511 Pain in right shoulder: Secondary | ICD-10-CM | POA: Diagnosis not present

## 2016-01-11 MED ORDER — OXYCODONE-ACETAMINOPHEN 5-325 MG PO TABS: 1.0000 | ORAL_TABLET | Freq: Three times a day (TID) | ORAL | Status: DC | PRN

## 2016-01-11 MED FILL — OXYCODONE/APAP 5-325: 5-325 | 13 days supply | Qty: 40 | Fill #0

## 2016-01-11 NOTE — Patient Instructions (Signed)
You have improved since last visit - frozen shoulder has mostly resolved. You do have rotator cuff impingement as well. Try to avoid painful activities (overhead activities, lifting with extended arm) as much as possible. Aleve 2 tabs twice a day with food OR ibuprofen 3 tabs three times a day with food for pain and inflammation only as needed now. Can take tylenol in addition to this. Subacromial injection may be beneficial to help with pain and to decrease inflammation. Consider physical therapy with transition to home exercise program. Do home exercise program with theraband and scapular stabilization exercises daily - these are very important for long term relief even if an injection was given.  3 sets of 10 once a day. If not improving at follow-up we will consider further imaging, injection, physical therapy, and/or nitro patches. Follow up with me in 1 month to 6 weeks.

## 2016-01-15 NOTE — Assessment & Plan Note (Signed)
adhesive capsulitis largely resolved at this point.  Most pain due to rotator cuff impingement.  Continue with home exercises.  NSAIDs as needed.  Consider imaging, injection, PT, nitro patches if not improving at follow up in 1 month to 6 weeks.

## 2016-01-15 NOTE — Progress Notes (Signed)
PCP and consultation requested by: Garnet Koyanagi, DO  Subjective:   HPI: Patient is a 65 y.o. male here for right shoulder pain.  1/24: Patient reports he's had about 7 weeks of right shoulder pain. Slowly worsened since first week of December. + night pain. Decreased motion. Pain level 6/10. Tried advil. Pain is shooting, dull, constant. No skin changes, fever, other complaints.  2/24: Patient reports he feels about the same though mobility has improved. Pain level 3/10 up to 7/10 worse at night, sharp lateral shoulder. Pain medication caused constipation. Worse with overhead motions and reaching. No skin changes, fever, other complaints.  Past Medical History  Diagnosis Date  . Gout   . Hypertension   . Cancer ([place])     skin/squamous cell    Current Outpatient Prescriptions on File Prior to Visit  Medication Sig Dispense Refill  . colchicine (COLCRYS) 0.6 MG tablet Take 1 tablet (0.6 mg total) by mouth daily. 90 tablet 3  . FINACEA 15 % cream Use topical as directed 30 g 5  . sildenafil (VIAGRA) 100 MG tablet Take 0.5-1 tablets (50-100 mg total) by mouth daily as needed for erectile dysfunction. 10 tablet 2  . valsartan (DIOVAN) 160 MG tablet TAKE 1 TABLET BY MOUTH ONCE DAILY 90 tablet 3   No current facility-administered medications on file prior to visit.    Past Surgical History  Procedure Laterality Date  . Wisdom tooth extraction      no sedation    No Known Allergies  Social History   Social History  . Marital Status: Married    Spouse Name: N/A  . Number of Children: N/A  . Years of Education: N/A   Occupational History  .      [employer]   Social History Main Topics  . Smoking status: Former Smoker -- 0.30 packs/day for 20 years    Types: Cigarettes    Quit date: 10/17/1994  . Smokeless tobacco: Never Used     Comment: quit 20 yrs ago  . Alcohol Use: 1.2 oz/week    2 Glasses of wine per week  . Drug Use: No  . Sexual Activity:  Yes   Other Topics Concern  . Not on file   Social History Narrative   Exercise 1 hour a day ---3-4 days a week    Family History  Problem Relation Age of Onset  . Heart disease Mother 104  . Alcohol abuse Mother   . Depression Mother   . Cancer Sister     hodgkins, breast  . Hypertension Father   . Colon cancer Neg Hx   . Pancreatic cancer Neg Hx   . Rectal cancer Neg Hx   . Stomach cancer Neg Hx     Ht '5\' 11"'$  (1.803 m)  Wt 210 lb (95.255 kg)  BMI 29.30 kg/m2  Review of Systems: See HPI above.    Objective:  Physical Exam:  Gen: NAD  Right shoulder: No swelling, ecchymoses.  No gross deformity. No TTP. ER 60 degrees, abduction 160 degrees, full flexion.  All painful - cannot go further with passive motion. Positive Hawkins, negative Neers. Negative Speeds, Yergasons. Strength 5/5 with empty can and resisted internal/external rotation.  Pain empty can NV intact distally.  Left shoulder: FROM without pain.    Assessment & Plan:  1. Right shoulder pain - adhesive capsulitis largely resolved at this point.  Most pain due to rotator cuff impingement.  Continue with home exercises.  NSAIDs as needed.  Consider imaging, injection, PT, nitro patches if not improving at follow up in 1 month to 6 weeks.

## 2016-02-15 ENCOUNTER — Encounter: Payer: Self-pay | Admitting: Family Medicine

## 2016-02-15 ENCOUNTER — Ambulatory Visit (INDEPENDENT_AMBULATORY_CARE_PROVIDER_SITE_OTHER): Payer: BC Managed Care – PPO | Admitting: Family Medicine

## 2016-02-15 VITALS — BP 155/85 | HR 78 | Ht 70.0 in | Wt 205.0 lb

## 2016-02-15 DIAGNOSIS — M25511 Pain in right shoulder: Secondary | ICD-10-CM | POA: Diagnosis not present

## 2016-02-15 MED ORDER — TRAMADOL HCL 50 MG PO TABS: 50.0000 mg | ORAL_TABLET | Freq: Four times a day (QID) | ORAL | Status: DC | PRN

## 2016-02-15 MED FILL — traMADol HCL 50 MG TABS: 50 | 15 days supply | Qty: 60 | Fill #0

## 2016-02-15 NOTE — Assessment & Plan Note (Signed)
adhesive capsulitis resolved at this point.  Pain due to rotator cuff impingement but improving.  He will continue with home exercises, NSAIDs with tramadol as needed.  Discussed considering injection, PT, nitro patches if he doesn't continue to improve.  F/u prn.

## 2016-02-15 NOTE — Progress Notes (Signed)
PCP and consultation requested by: Ann Held, DO  Subjective:   HPI: Patient is a 65 y.o. male here for right shoulder pain.  1/24: Patient reports he's had about 7 weeks of right shoulder pain. Slowly worsened since first week of December. + night pain. Decreased motion. Pain level 6/10. Tried advil. Pain is shooting, dull, constant. No skin changes, fever, other complaints.  2/24: Patient reports he feels about the same though mobility has improved. Pain level 3/10 up to 7/10 worse at night, sharp lateral shoulder. Pain medication caused constipation. Worse with overhead motions and reaching. No skin changes, fever, other complaints.  3/31: Patient reports he is doing well. Pain level 1-2/10. Bothers only at nighttime now lateral shoulder, sharp. Can keep him up. Taking oxycodone as needed occasionally only at bedtime. Doing home exercises. No skin changes, fever, other complaints.  Past Medical History  Diagnosis Date  . Gout   . Hypertension   . Cancer ([place])     skin/squamous cell    Current Outpatient Prescriptions on File Prior to Visit  Medication Sig Dispense Refill  . colchicine (COLCRYS) 0.6 MG tablet Take 1 tablet (0.6 mg total) by mouth daily. 90 tablet 3  . FINACEA 15 % cream Use topical as directed 30 g 5  . sildenafil (VIAGRA) 100 MG tablet Take 0.5-1 tablets (50-100 mg total) by mouth daily as needed for erectile dysfunction. 10 tablet 2  . valsartan (DIOVAN) 160 MG tablet TAKE 1 TABLET BY MOUTH ONCE DAILY 90 tablet 3   No current facility-administered medications on file prior to visit.    Past Surgical History  Procedure Laterality Date  . Wisdom tooth extraction      no sedation    No Known Allergies  Social History   Social History  . Marital Status: Married    Spouse Name: N/A  . Number of Children: N/A  . Years of Education: N/A   Occupational History  .      [employer]   Social History Main Topics  .  Smoking status: Former Smoker -- 0.30 packs/day for 20 years    Types: Cigarettes    Quit date: 10/17/1994  . Smokeless tobacco: Never Used     Comment: quit 20 yrs ago  . Alcohol Use: 1.2 oz/week    2 Glasses of wine per week  . Drug Use: No  . Sexual Activity: Yes   Other Topics Concern  . Not on file   Social History Narrative   Exercise 1 hour a day ---3-4 days a week    Family History  Problem Relation Age of Onset  . Heart disease Mother 41  . Alcohol abuse Mother   . Depression Mother   . Cancer Sister     hodgkins, breast  . Hypertension Father   . Colon cancer Neg Hx   . Pancreatic cancer Neg Hx   . Rectal cancer Neg Hx   . Stomach cancer Neg Hx     BP 155/85 mmHg  Pulse 78  Ht '5\' 10"'$  (1.778 m)  Wt 205 lb (92.987 kg)  BMI 29.41 kg/m2  Review of Systems: See HPI above.    Objective:  Physical Exam:  Gen: NAD  Right shoulder: No swelling, ecchymoses.  No gross deformity. No TTP. FROM now - much improved. Minimally positive Hawkins, negative Neers. Negative Speeds, Yergasons. Strength 5/5 with empty can and resisted internal/external rotation.  Pain empty can minimally. NV intact distally.  Left shoulder: FROM  without pain.    Assessment & Plan:  1. Right shoulder pain - adhesive capsulitis resolved at this point.  Pain due to rotator cuff impingement but improving.  He will continue with home exercises, NSAIDs with tramadol as needed.  Discussed considering injection, PT, nitro patches if he doesn't continue to improve.  F/u prn.

## 2016-02-15 NOTE — Patient Instructions (Signed)
Call me if you want to try an injection, the nitro patches, or physical therapy. Otherwise I would expect this to improve with time and the home exercises. Tramadol (this is a narcotic still - don't drive when taking) as needed for severe pain. Follow up with me as needed.

## 2016-03-19 MED ORDER — DICLOFENAC SODIUM 75 MG PO TBEC: 75.0000 mg | DELAYED_RELEASE_TABLET | Freq: Two times a day (BID) | ORAL | Status: DC | PRN

## 2016-03-19 MED FILL — DICLOFENAC SOD EC 75 MG TAB: 75 | 30 days supply | Qty: 60 | Fill #0

## 2016-03-19 NOTE — Addendum Note (Signed)
Addended by: Dene Gentry on: 03/19/2016 02:38 PM   Modules accepted: Orders

## 2016-06-10 ENCOUNTER — Ambulatory Visit (INDEPENDENT_AMBULATORY_CARE_PROVIDER_SITE_OTHER): Payer: BC Managed Care – PPO | Admitting: Family Medicine

## 2016-06-10 ENCOUNTER — Encounter: Payer: Self-pay | Admitting: Family Medicine

## 2016-06-10 DIAGNOSIS — M25511 Pain in right shoulder: Secondary | ICD-10-CM | POA: Diagnosis not present

## 2016-06-10 MED ORDER — TRAMADOL HCL 50 MG PO TABS: 50.0000 mg | ORAL_TABLET | Freq: Four times a day (QID) | ORAL | 0 refills | Status: DC | PRN

## 2016-06-10 MED FILL — traMADol HCL 50 MG TABS: 50 | 15 days supply | Qty: 60 | Fill #0

## 2016-06-10 NOTE — Patient Instructions (Signed)
Your shoulder exam is normal today which is good. However you're dealing with problems in your neck and back (trapezius, posture muscles like erector spinae, and rhomboids). Start physical therapy next and do home exercises on days you don't go to therapy. Tramadol as needed for severe pain. Tylenol, ibuprofen are ok to take as needed too and are both in a different class. Consider chiropractic care Higher education careers adviser [phone] or Mickie Kay), acupuncture. Follow up with me in 5-6 weeks for reevaluation.

## 2016-06-12 NOTE — Progress Notes (Signed)
PCP and consultation requested by: Ann Held, DO  Subjective:   HPI: Patient is a 65 y.o. male here for right shoulder pain.  1/24: Patient reports he's had about 7 weeks of right shoulder pain. Slowly worsened since first week of December. + night pain. Decreased motion. Pain level 6/10. Tried advil. Pain is shooting, dull, constant. No skin changes, fever, other complaints.  2/24: Patient reports he feels about the same though mobility has improved. Pain level 3/10 up to 7/10 worse at night, sharp lateral shoulder. Pain medication caused constipation. Worse with overhead motions and reaching. No skin changes, fever, other complaints.  3/31: Patient reports he is doing well. Pain level 1-2/10. Bothers only at nighttime now lateral shoulder, sharp. Can keep him up. Taking oxycodone as needed occasionally only at bedtime. Doing home exercises. No skin changes, fever, other complaints.  7/25: Patient reports his right shoulder is still bothering him. Gets up to 6/10 laterally but in right side of neck now too, sharp. Still with night pain, difficulty sleeping. Knot noticed in shoulder blade and pain will radiate down his back. No radiation down the arm, numbness or tingling. Does have an unusual pain in armpit at times. No bowel/bladder dysfunction. No skin changes.  Past Medical History:  Diagnosis Date  . Cancer ([facility])    skin/squamous cell  . Gout   . Hypertension     Current Outpatient Prescriptions on File Prior to Visit  Medication Sig Dispense Refill  . colchicine (COLCRYS) 0.6 MG tablet Take 1 tablet (0.6 mg total) by mouth daily. 90 tablet 3  . diclofenac (VOLTAREN) 75 MG EC tablet Take 1 tablet (75 mg total) by mouth 2 (two) times daily as needed. 60 tablet 1  . FINACEA 15 % cream Use topical as directed 30 g 5  . sildenafil (VIAGRA) 100 MG tablet Take 0.5-1 tablets (50-100 mg total) by mouth daily as needed for erectile dysfunction. 10 tablet  2  . valsartan (DIOVAN) 160 MG tablet TAKE 1 TABLET BY MOUTH ONCE DAILY 90 tablet 3   No current facility-administered medications on file prior to visit.     Past Surgical History:  Procedure Laterality Date  . WISDOM TOOTH EXTRACTION     no sedation    No Known Allergies  Social History   Social History  . Marital status: Married    Spouse name: N/A  . Number of children: N/A  . Years of education: N/A   Occupational History  .  [employer]. [employer]   Social History Main Topics  . Smoking status: Former Smoker    Packs/day: 0.30    Years: 20.00    Types: Cigarettes    Quit date: 10/17/1994  . Smokeless tobacco: Never Used     Comment: quit 20 yrs ago  . Alcohol use 1.2 oz/week    2 Glasses of wine per week  . Drug use: No  . Sexual activity: Yes   Other Topics Concern  . Not on file   Social History Narrative   Exercise 1 hour a day ---3-4 days a week    Family History  Problem Relation Age of Onset  . Heart disease Mother 54  . Alcohol abuse Mother   . Depression Mother   . Cancer Sister     hodgkins, breast  . Hypertension Father   . Colon cancer Neg Hx   . Pancreatic cancer Neg Hx   . Rectal cancer Neg  Hx   . Stomach cancer Neg Hx     Ht '5\' 10"'$  (1.778 m)   Wt 208 lb (94.3 kg)   BMI 29.84 kg/m   Review of Systems: See HPI above.    Objective:  Physical Exam:  Gen: NAD  Neck: No gross deformity, swelling, bruising. TTP right trapezius, paraspinal region, medial to right scapula.  No midline/bony TTP. FROM neck - pain right lateral rotation. BUE strength 5/5.   Sensation intact to light touch.   1+ equal reflexes in triceps, biceps, brachioradialis tendons. Negative spurlings. NV intact distal BUEs.  Right shoulder: No swelling, ecchymoses.  No gross deformity. No TTP. FROM without painful arc. Negative Hawkins, negative Neers. Negative Yergasons. Strength 5/5 with empty can and resisted  internal/external rotation.  No pain now. NV intact distally.  Left shoulder: FROM without pain.    Assessment & Plan:  1. Right shoulder pain - adhesive capsulitis resolved at this point.  He describes pain lateral shoulder still but exam is normal.  Primary issues more related to neck now and trapezius, posture muscles.  Start physical therapy.  Tylenol, nsaids as needed, tramadol as needed.  Consider chiropractic care as well.  F/u in 5-6 weeks.

## 2016-06-12 NOTE — Assessment & Plan Note (Signed)
adhesive capsulitis resolved at this point.  He describes pain lateral shoulder still but exam is normal.  Primary issues more related to neck now and trapezius, posture muscles.  Start physical therapy.  Tylenol, nsaids as needed, tramadol as needed.  Consider chiropractic care as well.  F/u in 5-6 weeks.

## 2016-07-03 ENCOUNTER — Encounter: Payer: Self-pay | Admitting: Family Medicine

## 2016-07-03 ENCOUNTER — Ambulatory Visit (INDEPENDENT_AMBULATORY_CARE_PROVIDER_SITE_OTHER): Payer: BC Managed Care – PPO | Admitting: Family Medicine

## 2016-07-03 VITALS — BP 200/110 | HR 105 | Temp 102.9°F | Wt 208.0 lb

## 2016-07-03 DIAGNOSIS — R059 Cough, unspecified: Secondary | ICD-10-CM

## 2016-07-03 DIAGNOSIS — R05 Cough: Secondary | ICD-10-CM

## 2016-07-03 DIAGNOSIS — J209 Acute bronchitis, unspecified: Secondary | ICD-10-CM

## 2016-07-03 MED ORDER — AZITHROMYCIN 250 MG PO TABS: ORAL_TABLET | ORAL | 0 refills | Status: DC

## 2016-07-03 MED ORDER — PROMETHAZINE-DM 6.25-15 MG/5ML PO SYRP: ORAL_SOLUTION | ORAL | 0 refills | Status: DC

## 2016-07-03 MED FILL — PROMETHAZINE-DM SYRUP: 6.25-15 | 18 days supply | Qty: 180 | Fill #0

## 2016-07-03 MED FILL — AZITHROMYCIN 250 MG TABLET: 250 | 5 days supply | Qty: 6 | Fill #0

## 2016-07-03 NOTE — Progress Notes (Signed)
Pre visit review using our clinic review tool, if applicable. No additional management support is needed unless otherwise documented below in the visit note. 

## 2016-07-03 NOTE — Progress Notes (Signed)
Subjective:     Thomas [name] is a 65 y.o. male here for evaluation of a cough. Onset of symptoms was 7 days ago. Symptoms have been gradually worsening since that time. The cough is productive and is aggravated by infection. Associated symptoms include: chills, fever, shortness of breath, sputum production and wheezing. Patient does not have a history of asthma. Patient does not have a history of environmental allergens. Patient has not traveled recently. Patient does not have a history of smoking. Patient has not had a previous chest x-ray. Patient has not had a PPD done.  The following portions of the patient's history were reviewed and updated as appropriate: He  has a past medical history of Cancer ([place]); Gout; and Hypertension. He  does not have any pertinent problems on file. He  has a past surgical history that includes Wisdom tooth extraction. His family history includes Alcohol abuse in his mother; Cancer in his sister; Depression in his mother; Heart disease (age of onset: 40) in his mother; Hypertension in his father. He  reports that he quit smoking about 21 years ago. His smoking use included Cigarettes. He has a 6.00 pack-year smoking history. He has never used smokeless tobacco. He reports that he drinks about 1.2 oz of alcohol per week . He reports that he does not use drugs. He has a current medication list which includes the following prescription(s): colchicine, diclofenac, finacea, sildenafil, tramadol, and valsartan. Current Outpatient Prescriptions on File Prior to Visit  Medication Sig Dispense Refill  . colchicine (COLCRYS) 0.6 MG tablet Take 1 tablet (0.6 mg total) by mouth daily. 90 tablet 3  . diclofenac (VOLTAREN) 75 MG EC tablet Take 1 tablet (75 mg total) by mouth 2 (two) times daily as needed. 60 tablet 1  . FINACEA 15 % cream Use topical as directed 30 g 5  . sildenafil (VIAGRA) 100 MG tablet Take 0.5-1 tablets (50-100 mg total) by mouth daily as needed for  erectile dysfunction. 10 tablet 2  . traMADol (ULTRAM) 50 MG tablet Take 1 tablet (50 mg total) by mouth every 6 (six) hours as needed. 60 tablet 0  . valsartan (DIOVAN) 160 MG tablet TAKE 1 TABLET BY MOUTH ONCE DAILY 90 tablet 3   No current facility-administered medications on file prior to visit.    He has No Known Allergies..  Review of Systems Pertinent items are noted in HPI.    Objective:    Oxygen saturation 97% on room air BP (!) 200/110 (BP Location: Right Arm, Patient Position: Sitting, Cuff Size: Normal)   Pulse (!) 105   Temp (!) 102.9 F (39.4 C) (Oral)   Wt 208 lb (94.3 kg)   SpO2 97%   BMI 29.84 kg/m  General appearance: alert, cooperative, appears stated age and no distress Head: Normocephalic, without obvious abnormality, atraumatic Ears: normal TM's and external ear canals both ears Nose: Nares normal. Septum midline. Mucosa normal. No drainage or sinus tenderness. Throat: lips, mucosa, and tongue normal; teeth and gums normal Lungs: diminished breath sounds RUL Heart: S1, S2 normal    Assessment:    Acute Bronchitis -- ? pneumonia   Plan:    Antibiotics per medication orders. Antitussives per medication orders. Avoid exposure to tobacco smoke and fumes. Call if shortness of breath worsens, blood in sputum, change in character of cough, development of fever or chills, inability to maintain nutrition and hydration. Avoid exposure to tobacco smoke and fumes.   cxr if no better -- he preferred  not to do it today

## 2016-07-03 NOTE — Patient Instructions (Signed)

## 2016-07-21 ENCOUNTER — Other Ambulatory Visit: Payer: Self-pay | Admitting: Family Medicine

## 2016-07-21 DIAGNOSIS — I10 Essential (primary) hypertension: Secondary | ICD-10-CM

## 2016-08-06 ENCOUNTER — Telehealth: Payer: Self-pay | Admitting: Family Medicine

## 2016-08-06 MED FILL — DICLOFENAC SOD EC 75 MG TAB: 75 | 30 days supply | Qty: 60 | Fill #1

## 2016-08-06 NOTE — Telephone Encounter (Signed)
Relation to VZ:CHYI Call back number:[phone]   Reason for call:  Patient dropped off Staff Medical Report for PCP to fill out. Form placed in bin near the front desk.

## 2016-08-08 NOTE — Telephone Encounter (Addendum)
Report received and is currently in process. Pt needs an appt.

## 2016-08-13 NOTE — Telephone Encounter (Addendum)
Called patient to schedule an appt.  Pt states that Dr. Etter Sjogren said he would not need an appt, but would base the answers to the question based on his current physical.  Per chart, his last CPE was 06/04/15.  Pt states he needs the form to be filled out as soon as possible. Started employment on 07/16/16.  Report to be completed within 60 days of initial employment.  Form placed in Dr. Nonda Lou red folder for review.    Please advise.

## 2016-08-14 NOTE — Telephone Encounter (Signed)
I have filled it out for him but would like him to still schedule a physical at his convenience

## 2016-08-14 NOTE — Telephone Encounter (Signed)
Physical scheduled for 11/14/2016 at 3:30pm.  Patient would like a call to confirm forms were faxed to the # indicated on the form. Please advise

## 2016-08-14 NOTE — Telephone Encounter (Signed)
Form complete, however per PCP patient needs a CPE. Please schedule.    KP

## 2016-08-15 NOTE — Telephone Encounter (Signed)
Attempted to fax form twice.  Fax confirmation stated "no answer."  Called patient and made him aware.  He asked that we mail form to his home address.  Home address verified with patient.  Original form mailed as requested.  Copy was numbered and placed in scan bin for scanning.

## 2016-10-19 ENCOUNTER — Other Ambulatory Visit: Payer: Self-pay | Admitting: Family Medicine

## 2016-11-14 ENCOUNTER — Ambulatory Visit (INDEPENDENT_AMBULATORY_CARE_PROVIDER_SITE_OTHER): Payer: BLUE CROSS/BLUE SHIELD | Admitting: Family Medicine

## 2016-11-14 ENCOUNTER — Encounter: Payer: Self-pay | Admitting: Family Medicine

## 2016-11-14 VITALS — BP 189/77 | HR 64 | Temp 98.4°F | Resp 16 | Ht 70.0 in | Wt 210.8 lb

## 2016-11-14 DIAGNOSIS — Z0000 Encounter for general adult medical examination without abnormal findings: Secondary | ICD-10-CM

## 2016-11-14 DIAGNOSIS — I10 Essential (primary) hypertension: Secondary | ICD-10-CM | POA: Diagnosis not present

## 2016-11-14 DIAGNOSIS — M109 Gout, unspecified: Secondary | ICD-10-CM | POA: Diagnosis not present

## 2016-11-14 DIAGNOSIS — M25511 Pain in right shoulder: Secondary | ICD-10-CM | POA: Diagnosis not present

## 2016-11-14 DIAGNOSIS — Z1159 Encounter for screening for other viral diseases: Secondary | ICD-10-CM

## 2016-11-14 DIAGNOSIS — M25512 Pain in left shoulder: Secondary | ICD-10-CM

## 2016-11-14 DIAGNOSIS — Z13220 Encounter for screening for lipoid disorders: Secondary | ICD-10-CM

## 2016-11-14 HISTORY — DX: Encounter for general adult medical examination without abnormal findings: Z00.00

## 2016-11-14 LAB — CBC
HCT: 49.5 % (ref 38.5–50.0)
Hemoglobin: 16.6 g/dL (ref 13.2–17.1)
MCH: 28.5 pg (ref 27.0–33.0)
MCHC: 33.5 g/dL (ref 32.0–36.0)
MCV: 84.9 fL (ref 80.0–100.0)
MPV: 9.7 fL (ref 7.5–12.5)
PLATELETS: 269 10*3/uL (ref 140–400)
RBC: 5.83 MIL/uL — AB (ref 4.20–5.80)
RDW: 13.8 % (ref 11.0–15.0)
WBC: 8.3 10*3/uL (ref 3.8–10.8)

## 2016-11-14 MED ORDER — COLCHICINE 0.6 MG PO TABS: 0.6000 mg | ORAL_TABLET | Freq: Every day | ORAL | 3 refills | Status: DC

## 2016-11-14 MED ORDER — VALSARTAN 320 MG PO TABS: 320.0000 mg | ORAL_TABLET | Freq: Every day | ORAL | 1 refills | Status: DC

## 2016-11-14 MED ORDER — TRAMADOL HCL 50 MG PO TABS: 50.0000 mg | ORAL_TABLET | Freq: Four times a day (QID) | ORAL | 0 refills | Status: DC | PRN

## 2016-11-14 MED FILL — traMADol HCL 50 MG TABS: 50 | 15 days supply | Qty: 60 | Fill #0

## 2016-11-14 NOTE — Assessment & Plan Note (Signed)
Worsening Take colchicine daily  Check uric acid level Pt reluctant to take allopurinol or uloric

## 2016-11-14 NOTE — Assessment & Plan Note (Signed)
Check labs  ghm utd See avs

## 2016-11-14 NOTE — Progress Notes (Signed)
Patient ID: Thomas [name], male    DOB: 07/27/1951  Age: 65 y.o. MRN: [id number]    Subjective:  Subjective  HPI  Thomas [name] presents for cpe  Pt still c/o shoulder pain -- needs f/u with Dr Felipe Drone --- pt with new ins starting Jan 1-- he is not sure what his copay will be for specialists and chiropractor/ pt were recommended at last visit with sport med  He is also concerned because his gout has gotten worse and he really does not want to take allopurinol due to possible side effects.     Review of Systems  Constitutional: Negative.   HENT: Negative for congestion, ear pain, hearing loss, nosebleeds, postnasal drip, rhinorrhea, sinus pressure, sneezing and tinnitus.   Eyes: Negative for photophobia, discharge, itching and visual disturbance.  Respiratory: Negative.   Cardiovascular: Negative.   Gastrointestinal: Negative for abdominal distention, abdominal pain, anal bleeding, blood in stool and constipation.  Endocrine: Negative.   Genitourinary: Negative.   Musculoskeletal: Negative.   Skin: Negative.   Allergic/Immunologic: Negative.   Neurological: Negative for dizziness, weakness, light-headedness, numbness and headaches.  Psychiatric/Behavioral: Negative for agitation, confusion, decreased concentration, dysphoric mood, sleep disturbance and suicidal ideas. The patient is not nervous/anxious.     History Past Medical History:  Diagnosis Date  . Cancer (HCC)    skin/squamous cell  . Gout   . Hypertension     He has a past surgical history that includes Wisdom tooth extraction.   His family history includes Alcohol abuse in his mother; Cancer in his sister; Depression in his mother; Heart disease (age of onset: 6) in his mother; Hypertension in his father.He reports that he quit smoking about 22 years ago. His smoking use included Cigarettes. He has a 6.00 pack-year smoking history. He has never used smokeless tobacco. He reports that he drinks about 1.2 oz of alcohol per  week . He reports that he does not use drugs.  Current Outpatient Prescriptions on File Prior to Visit  Medication Sig Dispense Refill  . FINACEA 15 % cream Use topical as directed 30 g 5  . sildenafil (VIAGRA) 100 MG tablet Take 0.5-1 tablets (50-100 mg total) by mouth daily as needed for erectile dysfunction. 10 tablet 2   No current facility-administered medications on file prior to visit.      Objective:  Objective  Physical Exam  Constitutional: He is oriented to person, place, and time. He appears well-developed and well-nourished. No distress.  HENT:  Head: Normocephalic and atraumatic.  Right Ear: External ear normal.  Left Ear: External ear normal.  Nose: Nose normal.  Mouth/Throat: Oropharynx is clear and moist. No oropharyngeal exudate.  Eyes: Conjunctivae and EOM are normal. Pupils are equal, round, and reactive to light. Right eye exhibits no discharge. Left eye exhibits no discharge.  Neck: Normal range of motion. Neck supple. No JVD present. No thyromegaly present.  Cardiovascular: Normal rate, regular rhythm and intact distal pulses.  Exam reveals no gallop and no friction rub.   No murmur heard. Pulmonary/Chest: Effort normal and breath sounds normal. No respiratory distress. He has no wheezes. He has no rales. He exhibits no tenderness.  Abdominal: Soft. Bowel sounds are normal. He exhibits no distension and no mass. There is no tenderness. There is no rebound and no guarding.  Genitourinary: Rectum normal, prostate normal and penis normal. Rectal exam shows guaiac negative stool.  Musculoskeletal: Normal range of motion. He exhibits no edema or tenderness.  Lymphadenopathy:    He has  no cervical adenopathy.  Neurological: He is alert and oriented to person, place, and time. He displays normal reflexes. He exhibits normal muscle tone.  Skin: Skin is warm and dry. No rash noted. He is not diaphoretic. No erythema. No pallor.  Psychiatric: He has a normal mood and  affect. His behavior is normal. Judgment and thought content normal.  Nursing note and vitals reviewed.  BP (!) 189/77   Pulse 64   Temp 98.4 F (36.9 C) (Oral)   Resp 16   Ht '5\' 10"'$  (1.778 m)   Wt 210 lb 12.8 oz (95.6 kg)   SpO2 99%   BMI 30.25 kg/m  Wt Readings from Last 3 Encounters:  11/14/16 210 lb 12.8 oz (95.6 kg)  07/03/16 208 lb (94.3 kg)  06/10/16 208 lb (94.3 kg)     Lab Results  Component Value Date   WBC 8.3 11/14/2016   HGB 16.6 11/14/2016   HCT 49.5 11/14/2016   PLT 269 11/14/2016   GLUCOSE 111 (H) 08/27/2015   CHOL 189 05/09/2014   TRIG 53.0 05/09/2014   HDL 42.20 05/09/2014   LDLCALC 136 (H) 05/09/2014   ALT 35 08/27/2015   AST 23 08/27/2015   NA 137 08/27/2015   K 3.9 08/27/2015   CL 101 08/27/2015   CREATININE 0.97 08/27/2015   BUN 15 08/27/2015   CO2 28 08/27/2015   TSH 0.65 05/09/2014    Dg Abd 2 Views  Result Date: 08/27/2015 CLINICAL DATA:  Five days of abdominal pain and loose stools EXAM: ABDOMEN - 2 VIEW COMPARISON:  None in PACs FINDINGS: There is mildly increased colonic stool burden. There is no small or large bowel obstructive pattern. No free extraluminal gas collections are observed. There are no abnormal soft tissue calcifications. There is gentle dextrocurvature centered the thoracolumbar junction which may be positional. IMPRESSION: Mildly increased stool burden within the ascending and transverse portions of the colon may reflect clinical constipation in the appropriate setting. There is no pattern of obstruction or gastroenteritis. Electronically Signed   By: David  Martinique M.D.   On: 08/27/2015 09:55     Assessment & Plan:  Plan  I have discontinued [name] diclofenac, azithromycin, promethazine-dextromethorphan, and valsartan. I am also having him start on valsartan. Additionally, I am having him maintain his FINACEA, sildenafil, b complex vitamins, MAGNESIUM PO, traMADol, and colchicine.  Meds ordered this encounter    Medications  . b complex vitamins tablet    Sig: Take 1 tablet by mouth daily.  Marland Kitchen MAGNESIUM PO    Sig: Take 1 tablet by mouth daily.  . valsartan (DIOVAN) 320 MG tablet    Sig: Take 1 tablet (320 mg total) by mouth daily.    Dispense:  90 tablet    Refill:  1  . traMADol (ULTRAM) 50 MG tablet    Sig: Take 1 tablet (50 mg total) by mouth every 6 (six) hours as needed.    Dispense:  60 tablet    Refill:  0  . colchicine (COLCRYS) 0.6 MG tablet    Sig: Take 1 tablet (0.6 mg total) by mouth daily.    Dispense:  90 tablet    Refill:  3    Problem List Items Addressed This Visit      Unprioritized   Gout    Worsening Take colchicine daily  Check uric acid level Pt reluctant to take allopurinol or uloric      Relevant Medications   colchicine (COLCRYS) 0.6 MG tablet  Other Relevant Orders   Uric acid (Completed)   HTN (hypertension)    Inc diovan 320 Recheck 2-3 weeks       Relevant Medications   valsartan (DIOVAN) 320 MG tablet   Pain in joint, shoulder region    F/u sport med      Relevant Medications   traMADol (ULTRAM) 50 MG tablet   Preventative health care - Primary    Check labs  ghm utd See avs       Other Visit Diagnoses    HTN, goal below 130/80       Relevant Medications   valsartan (DIOVAN) 320 MG tablet   Other Relevant Orders   Comprehensive metabolic panel (Completed)   CBC (Completed)   TSH (Completed)   Urinalysis (Completed)   Need for hepatitis C screening test       Relevant Orders   Hepatitis C antibody (Completed)   Screening cholesterol level       Relevant Orders   Lipid panel (Completed)      Follow-up: Return in about 3 weeks (around 12/05/2016) for hypertension.  Ann Held, DO

## 2016-11-14 NOTE — Assessment & Plan Note (Addendum)
Inc diovan 320 Recheck 2-3 weeks

## 2016-11-14 NOTE — Patient Instructions (Signed)
Preventive Care 63-64 Years, Male Preventive care refers to lifestyle choices and visits with your health care provider that can promote health and wellness. What does preventive care include?  A yearly physical exam. This is also called an annual well check.  Dental exams once or twice a year.  Routine eye exams. Ask your health care provider how often you should have your eyes checked.  Personal lifestyle choices, including:  Daily care of your teeth and gums.  Regular physical activity.  Eating a healthy diet.  Avoiding tobacco and drug use.  Limiting alcohol use.  Practicing safe sex.  Taking low-dose aspirin every day starting at age 50. What happens during an annual well check? The services and screenings done by your health care provider during your annual well check will depend on your age, overall health, lifestyle risk factors, and family history of disease. Counseling  Your health care provider may ask you questions about your:  Alcohol use.  Tobacco use.  Drug use.  Emotional well-being.  Home and relationship well-being.  Sexual activity.  Eating habits.  Work and work environment. Screening  You may have the following tests or measurements:  Height, weight, and BMI.  Blood pressure.  Lipid and cholesterol levels. These may be checked every 5 years, or more frequently if you are over 50 years old.  Skin check.  Lung cancer screening. You may have this screening every year starting at age 55 if you have a 30-pack-year history of smoking and currently smoke or have quit within the past 15 years.  Fecal occult blood test (FOBT) of the stool. You may have this test every year starting at age 50.  Flexible sigmoidoscopy or colonoscopy. You may have a sigmoidoscopy every 5 years or a colonoscopy every 10 years starting at age 50.  Prostate cancer screening. Recommendations will vary depending on your family history and other risks.  Hepatitis C  blood test.  Hepatitis B blood test.  Sexually transmitted disease (STD) testing.  Diabetes screening. This is done by checking your blood sugar (glucose) after you have not eaten for a while (fasting). You may have this done every 1-3 years. Discuss your test results, treatment options, and if necessary, the need for more tests with your health care provider. Vaccines  Your health care provider may recommend certain vaccines, such as:  Influenza vaccine. This is recommended every year.  Tetanus, diphtheria, and acellular pertussis (Tdap, Td) vaccine. You may need a Td booster every 10 years.  Varicella vaccine. You may need this if you have not been vaccinated.  Zoster vaccine. You may need this after age 63.  Measles, mumps, and rubella (MMR) vaccine. You may need at least one dose of MMR if you were born in 1957 or later. You may also need a second dose.  Pneumococcal 13-valent conjugate (PCV13) vaccine. You may need this if you have certain conditions and have not been vaccinated.  Pneumococcal polysaccharide (PPSV23) vaccine. You may need one or two doses if you smoke cigarettes or if you have certain conditions.  Meningococcal vaccine. You may need this if you have certain conditions.  Hepatitis A vaccine. You may need this if you have certain conditions or if you travel or work in places where you may be exposed to hepatitis A.  Hepatitis B vaccine. You may need this if you have certain conditions or if you travel or work in places where you may be exposed to hepatitis B.  Haemophilus influenzae type b (Hib)   vaccine. You may need this if you have certain risk factors. Talk to your health care provider about which screenings and vaccines you need and how often you need them. This information is not intended to replace advice given to you by your health care provider. Make sure you discuss any questions you have with your health care provider. Document Released: 11/30/2015  Document Revised: 07/23/2016 Document Reviewed: 09/04/2015 Elsevier Interactive Patient Education  2017 [employer].

## 2016-11-14 NOTE — Progress Notes (Signed)
Pre visit review using our clinic review tool, if applicable. No additional management support is needed unless otherwise documented below in the visit note. 

## 2016-11-14 NOTE — Assessment & Plan Note (Signed)
F/u sport med

## 2016-11-15 LAB — COMPREHENSIVE METABOLIC PANEL
ALBUMIN: 4.7 g/dL (ref 3.6–5.1)
ALT: 45 U/L (ref 9–46)
AST: 32 U/L (ref 10–35)
Alkaline Phosphatase: 88 U/L (ref 40–115)
BUN: 12 mg/dL (ref 7–25)
CHLORIDE: 102 mmol/L (ref 98–110)
CO2: 21 mmol/L (ref 20–31)
CREATININE: 0.92 mg/dL (ref 0.70–1.25)
Calcium: 9.6 mg/dL (ref 8.6–10.3)
GLUCOSE: 87 mg/dL (ref 65–99)
Potassium: 4.7 mmol/L (ref 3.5–5.3)
SODIUM: 141 mmol/L (ref 135–146)
Total Bilirubin: 0.5 mg/dL (ref 0.2–1.2)
Total Protein: 7.4 g/dL (ref 6.1–8.1)

## 2016-11-15 LAB — URINALYSIS
Bilirubin Urine: NEGATIVE
Glucose, UA: NEGATIVE
Hgb urine dipstick: NEGATIVE
Ketones, ur: NEGATIVE
LEUKOCYTES UA: NEGATIVE
NITRITE: NEGATIVE
PROTEIN: NEGATIVE
Specific Gravity, Urine: 1.018 (ref 1.001–1.035)
pH: 6 (ref 5.0–8.0)

## 2016-11-15 LAB — LIPID PANEL
Cholesterol: 244 mg/dL — ABNORMAL HIGH (ref <200)
HDL: 42 mg/dL (ref >40)
LDL CALC: 163 mg/dL — AB (ref <100)
TRIGLYCERIDES: 193 mg/dL — AB (ref <150)
Total CHOL/HDL Ratio: 5.8 Ratio — ABNORMAL HIGH (ref <5.0)
VLDL: 39 mg/dL — AB (ref <30)

## 2016-11-15 LAB — HEPATITIS C ANTIBODY: HCV AB: NEGATIVE

## 2016-11-15 LAB — URIC ACID: URIC ACID, SERUM: 7.3 mg/dL (ref 4.0–8.0)

## 2016-11-15 LAB — TSH: TSH: 1.85 m[IU]/L (ref 0.40–4.50)

## 2016-12-02 ENCOUNTER — Encounter: Payer: Self-pay | Admitting: Family Medicine

## 2016-12-02 ENCOUNTER — Ambulatory Visit (INDEPENDENT_AMBULATORY_CARE_PROVIDER_SITE_OTHER): Payer: BLUE CROSS/BLUE SHIELD | Admitting: Family Medicine

## 2016-12-02 VITALS — BP 140/70 | HR 67 | Temp 98.3°F | Ht 70.0 in | Wt 207.8 lb

## 2016-12-02 DIAGNOSIS — I10 Essential (primary) hypertension: Secondary | ICD-10-CM

## 2016-12-02 DIAGNOSIS — M25511 Pain in right shoulder: Secondary | ICD-10-CM

## 2016-12-02 DIAGNOSIS — M25512 Pain in left shoulder: Secondary | ICD-10-CM

## 2016-12-02 NOTE — Progress Notes (Signed)
Pre visit review using our clinic review tool, if applicable. No additional management support is needed unless otherwise documented below in the visit note. 

## 2016-12-02 NOTE — Patient Instructions (Signed)
Hypertension Hypertension, commonly called high blood pressure, is when the force of blood pumping through your arteries is too strong. Your arteries are the blood vessels that carry blood from your heart throughout your body. A blood pressure reading consists of a higher number over a lower number, such as 110/72. The higher number (systolic) is the pressure inside your arteries when your heart pumps. The lower number (diastolic) is the pressure inside your arteries when your heart relaxes. Ideally you want your blood pressure below 120/80. Hypertension forces your heart to work harder to pump blood. Your arteries may become narrow or stiff. Having untreated or uncontrolled hypertension can cause heart attack, stroke, kidney disease, and other problems. What increases the risk? Some risk factors for high blood pressure are controllable. Others are not. Risk factors you cannot control include:  Race. You may be at higher risk if you are African American.  Age. Risk increases with age.  Gender. Men are at higher risk than women before age 45 years. After age 65, women are at higher risk than men. Risk factors you can control include:  Not getting enough exercise or physical activity.  Being overweight.  Getting too much fat, sugar, calories, or salt in your diet.  Drinking too much alcohol. What are the signs or symptoms? Hypertension does not usually cause signs or symptoms. Extremely high blood pressure (hypertensive crisis) may cause headache, anxiety, shortness of breath, and nosebleed. How is this diagnosed? To check if you have hypertension, your health care provider will measure your blood pressure while you are seated, with your arm held at the level of your heart. It should be measured at least twice using the same arm. Certain conditions can cause a difference in blood pressure between your right and left arms. A blood pressure reading that is higher than normal on one occasion does  not mean that you need treatment. If it is not clear whether you have high blood pressure, you may be asked to return on a different day to have your blood pressure checked again. Or, you may be asked to monitor your blood pressure at home for 1 or more weeks. How is this treated? Treating high blood pressure includes making lifestyle changes and possibly taking medicine. Living a healthy lifestyle can help lower high blood pressure. You may need to change some of your habits. Lifestyle changes may include:  Following the DASH diet. This diet is high in fruits, vegetables, and whole grains. It is low in salt, red meat, and added sugars.  Keep your sodium intake below 2,300 mg per day.  Getting at least 30-45 minutes of aerobic exercise at least 4 times per week.  Losing weight if necessary.  Not smoking.  Limiting alcoholic beverages.  Learning ways to reduce stress. Your health care provider may prescribe medicine if lifestyle changes are not enough to get your blood pressure under control, and if one of the following is true:  You are 18-59 years of age and your systolic blood pressure is above 140.  You are 60 years of age or older, and your systolic blood pressure is above 150.  Your diastolic blood pressure is above 90.  You have diabetes, and your systolic blood pressure is over 140 or your diastolic blood pressure is over 90.  You have kidney disease and your blood pressure is above 140/90.  You have heart disease and your blood pressure is above 140/90. Your personal target blood pressure may vary depending on your medical   conditions, your age, and other factors. Follow these instructions at home:  Have your blood pressure rechecked as directed by your health care provider.  Take medicines only as directed by your health care provider. Follow the directions carefully. Blood pressure medicines must be taken as prescribed. The medicine does not work as well when you skip  doses. Skipping doses also puts you at risk for problems.  Do not smoke.  Monitor your blood pressure at home as directed by your health care provider. Contact a health care provider if:  You think you are having a reaction to medicines taken.  You have recurrent headaches or feel dizzy.  You have swelling in your ankles.  You have trouble with your vision. Get help right away if:  You develop a severe headache or confusion.  You have unusual weakness, numbness, or feel faint.  You have severe chest or abdominal pain.  You vomit repeatedly.  You have trouble breathing. This information is not intended to replace advice given to you by your health care provider. Make sure you discuss any questions you have with your health care provider. Document Released: 11/03/2005 Document Revised: 04/10/2016 Document Reviewed: 08/26/2013 Elsevier Interactive Patient Education  2017 [employer]  

## 2016-12-02 NOTE — Progress Notes (Signed)
Patient ID: Thomas [name], male   DOB: 08-Jun-1951, 66 y.o.   MRN: [id number]

## 2016-12-03 MED ORDER — TRAMADOL HCL 50 MG PO TABS: 50.0000 mg | ORAL_TABLET | Freq: Four times a day (QID) | ORAL | 0 refills | Status: DC | PRN

## 2016-12-03 MED ORDER — VALSARTAN 320 MG PO TABS: 320.0000 mg | ORAL_TABLET | Freq: Every day | ORAL | 1 refills | Status: DC

## 2016-12-03 NOTE — Progress Notes (Signed)
Subjective:    Patient ID: Thomas [name], male    DOB: [birth year], 66 y.o.   MRN: [id number]  Chief Complaint  Patient presents with  . Follow-up    HTN.    HPI Patient is in today for f/u bp.  No complaints.    Past Medical History:  Diagnosis Date  . Cancer (HCC)    skin/squamous cell  . Gout   . Hypertension     Past Surgical History:  Procedure Laterality Date  . WISDOM TOOTH EXTRACTION     no sedation    Family History  Problem Relation Age of Onset  . Heart disease Mother 83  . Alcohol abuse Mother   . Depression Mother   . Cancer Sister     hodgkins, breast  . Hypertension Father   . Colon cancer Neg Hx   . Pancreatic cancer Neg Hx   . Rectal cancer Neg Hx   . Stomach cancer Neg Hx     Social History   Social History  . Marital status: Married    Spouse name: N/A  . Number of children: N/A  . Years of education: N/A   Occupational History  . teacher    Social History Main Topics  . Smoking status: Former Smoker    Packs/day: 0.30    Years: 20.00    Types: Cigarettes    Quit date: 10/17/1994  . Smokeless tobacco: Never Used     Comment: quit 20 yrs ago  . Alcohol use 1.2 oz/week    2 Glasses of wine per week  . Drug use: No  . Sexual activity: Yes   Other Topics Concern  . Not on file   Social History Narrative   Exercise 1 hour a day ---3-4 days a week    Outpatient Medications Prior to Visit  Medication Sig Dispense Refill  . b complex vitamins tablet Take 1 tablet by mouth daily.    . colchicine (COLCRYS) 0.6 MG tablet Take 1 tablet (0.6 mg total) by mouth daily. 90 tablet 3  . FINACEA 15 % cream Use topical as directed 30 g 5  . MAGNESIUM PO Take 1 tablet by mouth daily.    . sildenafil (VIAGRA) 100 MG tablet Take 0.5-1 tablets (50-100 mg total) by mouth daily as needed for erectile dysfunction. 10 tablet 2  . traMADol (ULTRAM) 50 MG tablet Take 1 tablet (50 mg total) by mouth every 6 (six) hours as needed. 60 tablet 0  .  valsartan (DIOVAN) 320 MG tablet Take 1 tablet (320 mg total) by mouth daily. 90 tablet 1   No facility-administered medications prior to visit.     No Known Allergies  Review of Systems  Constitutional: Negative for chills, fever and malaise/fatigue.  HENT: Negative for congestion and hearing loss.   Eyes: Negative for discharge.  Respiratory: Negative for cough, sputum production and shortness of breath.   Cardiovascular: Negative for chest pain, palpitations and leg swelling.  Gastrointestinal: Negative for abdominal pain, blood in stool, constipation, diarrhea, heartburn, nausea and vomiting.  Genitourinary: Negative for dysuria, frequency, hematuria and urgency.  Musculoskeletal: Negative for back pain, falls and myalgias.  Skin: Negative for rash.  Neurological: Negative for dizziness, sensory change, loss of consciousness, weakness and headaches.  Endo/Heme/Allergies: Negative for environmental allergies. Does not bruise/bleed easily.  Psychiatric/Behavioral: Negative for depression and suicidal ideas. The patient is not nervous/anxious and does not have insomnia.        Objective:    Physical Exam  Constitutional: He is oriented to person, place, and time. Vital signs are normal. He appears well-developed and well-nourished. He is sleeping.  HENT:  Head: Normocephalic and atraumatic.  Mouth/Throat: Oropharynx is clear and moist.  Eyes: EOM are normal. Pupils are equal, round, and reactive to light.  Neck: Normal range of motion. Neck supple. No thyromegaly present.  Cardiovascular: Normal rate and regular rhythm.   No murmur heard. Pulmonary/Chest: Effort normal and breath sounds normal. No respiratory distress. He has no wheezes. He has no rales. He exhibits no tenderness.  Musculoskeletal: He exhibits no edema or tenderness.  Neurological: He is alert and oriented to person, place, and time.  Skin: Skin is warm and dry.  Psychiatric: He has a normal mood and affect.  His behavior is normal. Judgment and thought content normal.  Nursing note and vitals reviewed.   BP 140/70 (BP Location: Left Arm, Patient Position: Sitting, Cuff Size: Normal)   Pulse 67   Temp 98.3 F (36.8 C) (Oral)   Ht '5\' 10"'$  (1.778 m)   Wt 207 lb 12.8 oz (94.3 kg)   SpO2 96% Comment: RA  BMI 29.82 kg/m  Wt Readings from Last 3 Encounters:  12/02/16 207 lb 12.8 oz (94.3 kg)  11/14/16 210 lb 12.8 oz (95.6 kg)  07/03/16 208 lb (94.3 kg)     Lab Results  Component Value Date   WBC 8.3 11/14/2016   HGB 16.6 11/14/2016   HCT 49.5 11/14/2016   PLT 269 11/14/2016   GLUCOSE 87 11/14/2016   CHOL 244 (H) 11/14/2016   TRIG 193 (H) 11/14/2016   HDL 42 11/14/2016   LDLCALC 163 (H) 11/14/2016   ALT 45 11/14/2016   AST 32 11/14/2016   NA 141 11/14/2016   K 4.7 11/14/2016   CL 102 11/14/2016   CREATININE 0.92 11/14/2016   BUN 12 11/14/2016   CO2 21 11/14/2016   TSH 1.85 11/14/2016    Lab Results  Component Value Date   TSH 1.85 11/14/2016   Lab Results  Component Value Date   WBC 8.3 11/14/2016   HGB 16.6 11/14/2016   HCT 49.5 11/14/2016   MCV 84.9 11/14/2016   PLT 269 11/14/2016   Lab Results  Component Value Date   NA 141 11/14/2016   K 4.7 11/14/2016   CO2 21 11/14/2016   GLUCOSE 87 11/14/2016   BUN 12 11/14/2016   CREATININE 0.92 11/14/2016   BILITOT 0.5 11/14/2016   ALKPHOS 88 11/14/2016   AST 32 11/14/2016   ALT 45 11/14/2016   PROT 7.4 11/14/2016   ALBUMIN 4.7 11/14/2016   CALCIUM 9.6 11/14/2016   GFR 83.20 08/27/2015   Lab Results  Component Value Date   CHOL 244 (H) 11/14/2016   Lab Results  Component Value Date   HDL 42 11/14/2016   Lab Results  Component Value Date   LDLCALC 163 (H) 11/14/2016   Lab Results  Component Value Date   TRIG 193 (H) 11/14/2016   Lab Results  Component Value Date   CHOLHDL 5.8 (H) 11/14/2016   No results found for: HGBA1C     Assessment & Plan:   Problem List Items Addressed This Visit       Unprioritized   HTN (hypertension) - Primary   Relevant Medications   valsartan (DIOVAN) 320 MG tablet   Pain in joint, shoulder region   Relevant Medications   traMADol (ULTRAM) 50 MG tablet    Other Visit Diagnoses    HTN, goal below 130/80  Relevant Medications   valsartan (DIOVAN) 320 MG tablet      I am having Thomas [name] maintain his FINACEA, sildenafil, b complex vitamins, MAGNESIUM PO, colchicine, valsartan, and traMADol.  Meds ordered this encounter  Medications  . valsartan (DIOVAN) 320 MG tablet    Sig: Take 1 tablet (320 mg total) by mouth daily.    Dispense:  90 tablet    Refill:  1  . traMADol (ULTRAM) 50 MG tablet    Sig: Take 1 tablet (50 mg total) by mouth every 6 (six) hours as needed.    Dispense:  60 tablet    Refill:  0    CMA served as scribe during this visit. History, Physical and Plan performed by medical provider. Documentation and orders reviewed and attested to.   Ann Held, DO

## 2016-12-03 NOTE — Assessment & Plan Note (Signed)
Stable con't diovan rto 3 months

## 2017-01-28 ENCOUNTER — Other Ambulatory Visit: Payer: Self-pay | Admitting: Family Medicine

## 2017-01-28 DIAGNOSIS — M25511 Pain in right shoulder: Secondary | ICD-10-CM

## 2017-01-28 DIAGNOSIS — M25512 Pain in left shoulder: Principal | ICD-10-CM

## 2017-01-29 MED ORDER — TRAMADOL HCL 50 MG PO TABS: 50.0000 mg | ORAL_TABLET | Freq: Four times a day (QID) | ORAL | 0 refills | Status: DC | PRN

## 2017-01-29 NOTE — Telephone Encounter (Signed)
Last office visit 12/02/2016--- no future appt. Scheduled Last refill  #60 no refills on 12/03/2016 No UDS/ No Contract

## 2017-01-29 NOTE — Telephone Encounter (Signed)
Faxed hardcopy to [facility] [place]

## 2017-01-29 NOTE — Addendum Note (Signed)
Addended by: Sharon Seller B on: 01/29/2017 12:56 PM   Modules accepted: Orders

## 2017-04-19 ENCOUNTER — Other Ambulatory Visit: Payer: Self-pay | Admitting: Family Medicine

## 2017-04-19 DIAGNOSIS — M25511 Pain in right shoulder: Secondary | ICD-10-CM

## 2017-04-19 DIAGNOSIS — M25512 Pain in left shoulder: Principal | ICD-10-CM

## 2017-04-20 MED ORDER — TRAMADOL HCL 50 MG PO TABS: 50.0000 mg | ORAL_TABLET | Freq: Four times a day (QID) | ORAL | 0 refills | Status: DC | PRN

## 2017-04-20 NOTE — Telephone Encounter (Signed)
Faxed hardcopy for tramadol to [facility]

## 2017-04-21 ENCOUNTER — Encounter: Payer: Self-pay | Admitting: Family Medicine

## 2017-04-21 MED ORDER — TRAMADOL HCL 50 MG PO TABS: 50.0000 mg | ORAL_TABLET | Freq: Four times a day (QID) | ORAL | 0 refills | Status: DC | PRN

## 2017-04-21 NOTE — Addendum Note (Signed)
Addended by: Sharon Seller B on: 04/21/2017 07:09 AM   Modules accepted: Orders

## 2017-06-12 ENCOUNTER — Telehealth: Payer: Self-pay | Admitting: Family Medicine

## 2017-06-12 NOTE — Telephone Encounter (Signed)
Pt called in to request a different medication due to current medication recall (valsartan)   CB: [phone]    Pharmacy: [facility] [place], [place] - [address]

## 2017-06-12 NOTE — Telephone Encounter (Signed)
Losartan 100 mg #30  1 po qd , 2 refills  bp check in 2-3 weeks

## 2017-06-15 MED ORDER — OLMESARTAN MEDOXOMIL 20 MG PO TABS: 20.0000 mg | ORAL_TABLET | Freq: Every day | ORAL | 2 refills | Status: DC

## 2017-06-15 NOTE — Telephone Encounter (Signed)
Placed in allergies.  Can we change to Benicar?

## 2017-06-15 NOTE — Telephone Encounter (Signed)
Pt called in he said that he would like to be changed to something different from Losartan. He said that he use to take medication and it caused him to have a terrible cough. Pt says that pharmacy suggested Benicar to him instead. Pt would like to have Rx sent to pharmacy below.   Pharmacy: [facility] in [place]: [phone].[place]

## 2017-06-15 NOTE — Telephone Encounter (Signed)
Left message that rx was sent in

## 2017-06-15 NOTE — Telephone Encounter (Signed)
Patient is on vacation and he will call us back to let us know where to send his medication.

## 2017-06-15 NOTE — Telephone Encounter (Signed)
benicar 40 mg 1 po qd  #30  2 refills

## 2017-07-13 ENCOUNTER — Telehealth: Payer: Self-pay | Admitting: Family Medicine

## 2017-07-13 DIAGNOSIS — L709 Acne, unspecified: Secondary | ICD-10-CM

## 2017-07-13 NOTE — Telephone Encounter (Signed)
Pharmacy   Refill request for FINACEA 15 % cream    [place]

## 2017-07-14 ENCOUNTER — Telehealth: Payer: Self-pay

## 2017-07-14 MED ORDER — FINACEA 15 % EX GEL: CUTANEOUS | 0 refills | Status: DC

## 2017-07-14 NOTE — Telephone Encounter (Signed)
lvm for pt to call back to schedule apt with PCP.

## 2017-07-14 NOTE — Telephone Encounter (Signed)
PA initiated via Covermymeds; KEY: [id number]. Awaiting determination.

## 2017-07-14 NOTE — Telephone Encounter (Signed)
Refill sent. Pt overdue for appt w/ PCP.

## 2017-07-21 ENCOUNTER — Encounter: Payer: Self-pay | Admitting: Family Medicine

## 2017-07-21 ENCOUNTER — Ambulatory Visit (INDEPENDENT_AMBULATORY_CARE_PROVIDER_SITE_OTHER): Payer: BLUE CROSS/BLUE SHIELD | Admitting: Family Medicine

## 2017-07-21 VITALS — BP 164/88 | HR 69 | Temp 98.6°F | Ht 70.0 in | Wt 212.2 lb

## 2017-07-21 DIAGNOSIS — M25512 Pain in left shoulder: Secondary | ICD-10-CM | POA: Diagnosis not present

## 2017-07-21 DIAGNOSIS — M25511 Pain in right shoulder: Secondary | ICD-10-CM | POA: Diagnosis not present

## 2017-07-21 DIAGNOSIS — I10 Essential (primary) hypertension: Secondary | ICD-10-CM | POA: Diagnosis not present

## 2017-07-21 DIAGNOSIS — M109 Gout, unspecified: Secondary | ICD-10-CM

## 2017-07-21 DIAGNOSIS — Z23 Encounter for immunization: Secondary | ICD-10-CM

## 2017-07-21 DIAGNOSIS — N529 Male erectile dysfunction, unspecified: Secondary | ICD-10-CM | POA: Diagnosis not present

## 2017-07-21 HISTORY — DX: Essential (primary) hypertension: I10

## 2017-07-21 HISTORY — DX: Gout, unspecified: M10.9

## 2017-07-21 MED ORDER — TRAMADOL HCL 50 MG PO TABS: 50.0000 mg | ORAL_TABLET | Freq: Four times a day (QID) | ORAL | 0 refills | Status: DC | PRN

## 2017-07-21 MED ORDER — SILDENAFIL CITRATE 100 MG PO TABS: 50.0000 mg | ORAL_TABLET | Freq: Every day | ORAL | 2 refills | Status: DC | PRN

## 2017-07-21 MED ORDER — OLMESARTAN MEDOXOMIL 40 MG PO TABS: 40.0000 mg | ORAL_TABLET | Freq: Every day | ORAL | 3 refills | Status: DC

## 2017-07-21 NOTE — Assessment & Plan Note (Signed)
Check uric acid. 

## 2017-07-21 NOTE — Patient Instructions (Signed)

## 2017-07-21 NOTE — Telephone Encounter (Signed)
Effective from 07/14/2017 through 07/13/2018.

## 2017-07-21 NOTE — Assessment & Plan Note (Signed)
Poorly controlled will alter medications, encouraged DASH diet, minimize caffeine and obtain adequate sleep. Report concerning symptoms and follow up as directed and as needed 

## 2017-07-21 NOTE — Progress Notes (Signed)
Patient ID: Thomas [name], male    DOB: 1951/04/02  Age: 66 y.o. MRN: [id number]    Subjective:  Subjective  HPI [name] presents for f/u bp after changing valsartan to benicar.  No complaints. He needs med refills as well.  Review of Systems  Constitutional: Negative for appetite change, diaphoresis, fatigue and unexpected weight change.  Eyes: Negative for pain, redness and visual disturbance.  Respiratory: Negative for cough, chest tightness, shortness of breath and wheezing.   Cardiovascular: Negative for chest pain, palpitations and leg swelling.  Endocrine: Negative for cold intolerance, heat intolerance, polydipsia, polyphagia and polyuria.  Genitourinary: Negative for difficulty urinating, dysuria and frequency.  Neurological: Negative for dizziness, light-headedness, numbness and headaches.    History Past Medical History:  Diagnosis Date  . Cancer (HCC)    skin/squamous cell  . Gout   . Hypertension     He has a past surgical history that includes Wisdom tooth extraction.   His family history includes Alcohol abuse in his mother; Cancer in his sister; Depression in his mother; Heart disease (age of onset: 25) in his mother; Hypertension in his father.He reports that he quit smoking about 22 years ago. His smoking use included Cigarettes. He has a 6.00 pack-year smoking history. He has never used smokeless tobacco. He reports that he drinks about 1.2 oz of alcohol per week . He reports that he does not use drugs.  Current Outpatient Prescriptions on File Prior to Visit  Medication Sig Dispense Refill  . b complex vitamins tablet Take 1 tablet by mouth daily.    . colchicine (COLCRYS) 0.6 MG tablet Take 1 tablet (0.6 mg total) by mouth daily. 90 tablet 3  . FINACEA 15 % cream Use topical as directed 30 g 0  . MAGNESIUM PO Take 1 tablet by mouth daily.     No current facility-administered medications on file prior to visit.      Objective:  Objective  Physical  Exam  Constitutional: He is oriented to person, place, and time. Vital signs are normal. He appears well-developed and well-nourished. He is sleeping.  HENT:  Head: Normocephalic and atraumatic.  Mouth/Throat: Oropharynx is clear and moist.  Eyes: Pupils are equal, round, and reactive to light. EOM are normal.  Neck: Normal range of motion. Neck supple. No thyromegaly present.  Cardiovascular: Normal rate and regular rhythm.   No murmur heard. Pulmonary/Chest: Effort normal and breath sounds normal. No respiratory distress. He has no wheezes. He has no rales. He exhibits no tenderness.  Musculoskeletal: He exhibits no edema or tenderness.  Neurological: He is alert and oriented to person, place, and time.  Skin: Skin is warm and dry.  Psychiatric: He has a normal mood and affect. His behavior is normal. Judgment and thought content normal.  Nursing note and vitals reviewed.  BP (!) 164/88 (BP Location: Left Arm, Patient Position: Sitting)   Pulse 69   Temp 98.6 F (37 C) (Oral)   Ht 5\' 10"  (1.778 m)   Wt 212 lb 3.2 oz (96.3 kg)   SpO2 97%   BMI 30.45 kg/m  Wt Readings from Last 3 Encounters:  07/21/17 212 lb 3.2 oz (96.3 kg)  12/02/16 207 lb 12.8 oz (94.3 kg)  11/14/16 210 lb 12.8 oz (95.6 kg)     Lab Results  Component Value Date   WBC 8.3 11/14/2016   HGB 16.6 11/14/2016   HCT 49.5 11/14/2016   PLT 269 11/14/2016   GLUCOSE 87 11/14/2016   CHOL  244 (H) 11/14/2016   TRIG 193 (H) 11/14/2016   HDL 42 11/14/2016   LDLCALC 163 (H) 11/14/2016   ALT 45 11/14/2016   AST 32 11/14/2016   NA 141 11/14/2016   K 4.7 11/14/2016   CL 102 11/14/2016   CREATININE 0.92 11/14/2016   BUN 12 11/14/2016   CO2 21 11/14/2016   TSH 1.85 11/14/2016    Dg Abd 2 Views  Result Date: 08/27/2015 CLINICAL DATA:  Five days of abdominal pain and loose stools EXAM: ABDOMEN - 2 VIEW COMPARISON:  None in PACs FINDINGS: There is mildly increased colonic stool burden. There is no small or large  bowel obstructive pattern. No free extraluminal gas collections are observed. There are no abnormal soft tissue calcifications. There is gentle dextrocurvature centered the thoracolumbar junction which may be positional. IMPRESSION: Mildly increased stool burden within the ascending and transverse portions of the colon may reflect clinical constipation in the appropriate setting. There is no pattern of obstruction or gastroenteritis. Electronically Signed   By: David  Martinique M.D.   On: 08/27/2015 09:55     Assessment & Plan:  Plan  I have discontinued Mr. Jourdan olmesartan. I am also having him start on olmesartan. Additionally, I am having him maintain his b complex vitamins, MAGNESIUM PO, colchicine, FINACEA, traMADol, and sildenafil.  Meds ordered this encounter  Medications  . traMADol (ULTRAM) 50 MG tablet    Sig: Take 1 tablet (50 mg total) by mouth every 6 (six) hours as needed.    Dispense:  60 tablet    Refill:  0  . olmesartan (BENICAR) 40 MG tablet    Sig: Take 1 tablet (40 mg total) by mouth daily.    Dispense:  30 tablet    Refill:  3  . sildenafil (VIAGRA) 100 MG tablet    Sig: Take 0.5-1 tablets (50-100 mg total) by mouth daily as needed for erectile dysfunction.    Dispense:  10 tablet    Refill:  2    Problem List Items Addressed This Visit      Unprioritized   Pain in joint, shoulder region   Relevant Medications   traMADol (ULTRAM) 50 MG tablet   Essential hypertension - Primary    Poorly controlled will alter medications, encouraged DASH diet, minimize caffeine and obtain adequate sleep. Report concerning symptoms and follow up as directed and as needed      Relevant Medications   olmesartan (BENICAR) 40 MG tablet   sildenafil (VIAGRA) 100 MG tablet   Other Relevant Orders   Lipid panel   Comprehensive metabolic panel   Gout of multiple sites    Check uric acid      Relevant Orders   Uric acid    Other Visit Diagnoses    Erectile dysfunction,  unspecified erectile dysfunction type       Relevant Medications   sildenafil (VIAGRA) 100 MG tablet      Follow-up: Return in about 3 months (around 10/20/2017) for hypertension.  Ann Held, DO

## 2017-07-22 LAB — COMPREHENSIVE METABOLIC PANEL
ALK PHOS: 76 U/L (ref 39–117)
ALT: 63 U/L — AB (ref 0–53)
AST: 30 U/L (ref 0–37)
Albumin: 4.6 g/dL (ref 3.5–5.2)
BILIRUBIN TOTAL: 0.5 mg/dL (ref 0.2–1.2)
BUN: 15 mg/dL (ref 6–23)
CALCIUM: 9.7 mg/dL (ref 8.4–10.5)
CO2: 30 mEq/L (ref 19–32)
Chloride: 103 mEq/L (ref 96–112)
Creatinine, Ser: 1.01 mg/dL (ref 0.40–1.50)
GFR: 78.93 mL/min (ref >60.00)
Glucose, Bld: 90 mg/dL (ref 70–99)
Potassium: 4.4 mEq/L (ref 3.5–5.1)
Sodium: 140 mEq/L (ref 135–145)
TOTAL PROTEIN: 7.2 g/dL (ref 6.0–8.3)

## 2017-07-22 LAB — LIPID PANEL
CHOL/HDL RATIO: 5
Cholesterol: 218 mg/dL — ABNORMAL HIGH (ref 0–200)
HDL: 40.1 mg/dL (ref >39.00)
LDL Cholesterol: 139 mg/dL — ABNORMAL HIGH (ref 0–99)
NonHDL: 178
TRIGLYCERIDES: 194 mg/dL — AB (ref 0.0–149.0)
VLDL: 38.8 mg/dL (ref 0.0–40.0)

## 2017-07-22 LAB — URIC ACID: URIC ACID, SERUM: 7.5 mg/dL (ref 4.0–7.8)

## 2017-07-24 ENCOUNTER — Other Ambulatory Visit: Payer: Self-pay | Admitting: Family Medicine

## 2017-07-24 DIAGNOSIS — E785 Hyperlipidemia, unspecified: Secondary | ICD-10-CM

## 2017-08-15 ENCOUNTER — Encounter: Payer: Self-pay | Admitting: Family Medicine

## 2017-08-17 ENCOUNTER — Telehealth: Payer: Self-pay | Admitting: Family Medicine

## 2017-08-17 NOTE — Telephone Encounter (Signed)
Pt says that he was seen at a ED in [place] for BP concern. Pt called in to schedule an follow up apt w/PCP when she returns in office. Also offered to schedule pt with a different provider in office. Pt declined. And stated that he will check with a different provider to see if he is able to be seen sooner. Pt states that Thursday is to long to wait to see PCP.

## 2017-08-18 NOTE — Telephone Encounter (Signed)
Pt has been scheduled.  °

## 2017-08-20 ENCOUNTER — Ambulatory Visit: Payer: BLUE CROSS/BLUE SHIELD | Admitting: Family Medicine

## 2017-08-21 ENCOUNTER — Ambulatory Visit (INDEPENDENT_AMBULATORY_CARE_PROVIDER_SITE_OTHER): Payer: BLUE CROSS/BLUE SHIELD | Admitting: Family Medicine

## 2017-08-21 ENCOUNTER — Encounter: Payer: Self-pay | Admitting: Family Medicine

## 2017-08-21 VITALS — BP 184/102 | HR 84 | Ht 70.0 in | Wt 209.4 lb

## 2017-08-21 DIAGNOSIS — I10 Essential (primary) hypertension: Secondary | ICD-10-CM

## 2017-08-21 MED ORDER — AMLODIPINE BESYLATE 5 MG PO TABS: 5.0000 mg | ORAL_TABLET | Freq: Every day | ORAL | 3 refills | Status: DC

## 2017-08-21 NOTE — Assessment & Plan Note (Signed)
Inc norvasc to 5 mg [place] r/o RAS Check 24 h urine  -- met and cat  Pt has appointment with cardiology

## 2017-08-21 NOTE — Progress Notes (Signed)
Patient ID: Thomas [name], male    DOB: [birth year]  Age: 66 y.o. MRN: [id number]    Subjective:  Subjective  HPI Thomas [name] presents for f/u bp.  Its been running high.  Er in [place] changed benicar to norvasc 2.5.  Pt was dx kidney stone in [place] and was put on flomax and pain meds.  Pain has subsided a lot but bp still high.  No cp or sob.  No headaches Review of Systems  Constitutional: Negative for appetite change, diaphoresis, fatigue and unexpected weight change.  Eyes: Negative for pain, redness and visual disturbance.  Respiratory: Negative for cough, chest tightness, shortness of breath and wheezing.   Cardiovascular: Negative for chest pain, palpitations and leg swelling.  Endocrine: Negative for cold intolerance, heat intolerance, polydipsia, polyphagia and polyuria.  Genitourinary: Negative for difficulty urinating, dysuria and frequency.  Neurological: Negative for dizziness, light-headedness, numbness and headaches.    History Past Medical History:  Diagnosis Date  . Cancer (HCC)    skin/squamous cell  . Gout   . Hypertension     He has a past surgical history that includes Wisdom tooth extraction.   His family history includes Alcohol abuse in his mother; Cancer in his sister; Depression in his mother; Heart disease (age of onset: 75) in his mother; Hypertension in his father.He reports that he quit smoking about 22 years ago. His smoking use included Cigarettes. He has a 6.00 pack-year smoking history. He has never used smokeless tobacco. He reports that he drinks about 1.2 oz of alcohol per week . He reports that he does not use drugs.  Current Outpatient Prescriptions on File Prior to Visit  Medication Sig Dispense Refill  . b complex vitamins tablet Take 1 tablet by mouth daily.    . colchicine (COLCRYS) 0.6 MG tablet Take 1 tablet (0.6 mg total) by mouth daily. 90 tablet 3  . FINACEA 15 % cream Use topical as directed 30 g 0  . MAGNESIUM PO Take 1 tablet by  mouth daily.    . sildenafil (VIAGRA) 100 MG tablet Take 0.5-1 tablets (50-100 mg total) by mouth daily as needed for erectile dysfunction. 10 tablet 2  . traMADol (ULTRAM) 50 MG tablet Take 1 tablet (50 mg total) by mouth every 6 (six) hours as needed. 60 tablet 0   No current facility-administered medications on file prior to visit.      Objective:  Objective  Physical Exam  Constitutional: He is oriented to person, place, and time. Vital signs are normal. He appears well-developed and well-nourished. He is sleeping.  HENT:  Head: Normocephalic and atraumatic.  Mouth/Throat: Oropharynx is clear and moist.  Eyes: Pupils are equal, round, and reactive to light. EOM are normal.  Neck: Normal range of motion. Neck supple. No thyromegaly present.  Cardiovascular: Normal rate and regular rhythm.   No murmur heard. Pulmonary/Chest: Effort normal and breath sounds normal. No respiratory distress. He has no wheezes. He has no rales. He exhibits no tenderness.  Musculoskeletal: He exhibits no edema or tenderness.  Neurological: He is alert and oriented to person, place, and time.  Skin: Skin is warm and dry.  Psychiatric: He has a normal mood and affect. His behavior is normal. Judgment and thought content normal.  Nursing note and vitals reviewed.  BP (!) 184/102   Pulse 84   Ht '5\' 10"'$  (1.778 m)   Wt 209 lb 6.4 oz (95 kg)   SpO2 99%   BMI 30.05 kg/m  Wt Readings  from Last 3 Encounters:  08/21/17 209 lb 6.4 oz (95 kg)  07/21/17 212 lb 3.2 oz (96.3 kg)  12/02/16 207 lb 12.8 oz (94.3 kg)   ekg -- nsr, ns t wave abnormality  Lab Results  Component Value Date   WBC 8.3 11/14/2016   HGB 16.6 11/14/2016   HCT 49.5 11/14/2016   PLT 269 11/14/2016   GLUCOSE 90 07/21/2017   CHOL 218 (H) 07/21/2017   TRIG 194.0 (H) 07/21/2017   HDL 40.10 07/21/2017   LDLCALC 139 (H) 07/21/2017   ALT 63 (H) 07/21/2017   AST 30 07/21/2017   NA 140 07/21/2017   K 4.4 07/21/2017   CL 103 07/21/2017    CREATININE 1.01 07/21/2017   BUN 15 07/21/2017   CO2 30 07/21/2017   TSH 1.85 11/14/2016    Dg Abd 2 Views  Result Date: 08/27/2015 CLINICAL DATA:  Five days of abdominal pain and loose stools EXAM: ABDOMEN - 2 VIEW COMPARISON:  None in PACs FINDINGS: There is mildly increased colonic stool burden. There is no small or large bowel obstructive pattern. No free extraluminal gas collections are observed. There are no abnormal soft tissue calcifications. There is gentle dextrocurvature centered the thoracolumbar junction which may be positional. IMPRESSION: Mildly increased stool burden within the ascending and transverse portions of the colon may reflect clinical constipation in the appropriate setting. There is no pattern of obstruction or gastroenteritis. Electronically Signed   By: David  Martinique M.D.   On: 08/27/2015 09:55     Assessment & Plan:  Plan  I have discontinued [name] olmesartan and amLODipine. I am also having him start on amLODipine. Additionally, I am having him maintain his b complex vitamins, MAGNESIUM PO, colchicine, FINACEA, traMADol, sildenafil, oxyCODONE-acetaminophen, and tamsulosin.  Meds ordered this encounter  Medications  . oxyCODONE-acetaminophen (PERCOCET/ROXICET) 5-325 MG tablet    Sig: TK 1 T PO Q 6 H PRN FOR P. DO NOT EXCEED MORE THAT '4000MG'$  OF TYLENOL  PER DAY    Refill:  0  . DISCONTD: amLODipine (NORVASC) 2.5 MG tablet    Sig: TK 1 T PO QD    Refill:  0  . tamsulosin (FLOMAX) 0.4 MG CAPS capsule    Sig: TK 1 C PO QD    Refill:  0  . amLODipine (NORVASC) 5 MG tablet    Sig: Take 1 tablet (5 mg total) by mouth daily.    Dispense:  90 tablet    Refill:  3    Problem List Items Addressed This Visit      Unprioritized   Hypertension not at goal - Primary    Inc norvasc to 5 mg [place] r/o RAS Check 24 h urine  -- met and cat  Pt has appointment with cardiology      Relevant Medications   amLODipine (NORVASC) 5 MG tablet   Other Relevant  Orders   US Renal   CBC with Differential/Platelet   Comprehensive metabolic panel   POCT Urinalysis Dipstick (Automated)   EKG 12-Lead (Completed)      Follow-up: Return in about 2 weeks (around 09/04/2017) for hypertension.  Ann Held, DO

## 2017-08-21 NOTE — Patient Instructions (Signed)

## 2017-08-24 ENCOUNTER — Ambulatory Visit: Payer: BLUE CROSS/BLUE SHIELD | Admitting: Cardiology

## 2017-10-16 ENCOUNTER — Ambulatory Visit: Payer: BLUE CROSS/BLUE SHIELD | Admitting: Family Medicine

## 2017-10-16 ENCOUNTER — Encounter: Payer: Self-pay | Admitting: Family Medicine

## 2017-10-16 VITALS — BP 190/80 | HR 74 | Temp 98.0°F | Ht 69.6 in | Wt 210.0 lb

## 2017-10-16 DIAGNOSIS — L720 Epidermal cyst: Secondary | ICD-10-CM | POA: Diagnosis not present

## 2017-10-16 DIAGNOSIS — Z23 Encounter for immunization: Secondary | ICD-10-CM

## 2017-10-16 DIAGNOSIS — I10 Essential (primary) hypertension: Secondary | ICD-10-CM | POA: Diagnosis not present

## 2017-10-16 HISTORY — DX: Epidermal cyst: L72.0

## 2017-10-16 MED ORDER — AMOXICILLIN-POT CLAVULANATE 875-125 MG PO TABS: 1.0000 | ORAL_TABLET | Freq: Two times a day (BID) | ORAL | 0 refills | Status: DC

## 2017-10-16 MED ORDER — NONFORMULARY OR COMPOUNDED ITEM: 0 refills | Status: DC

## 2017-10-16 MED ORDER — AMLODIPINE BESYLATE 10 MG PO TABS: 10.0000 mg | ORAL_TABLET | Freq: Every day | ORAL | 3 refills | Status: DC

## 2017-10-16 NOTE — Assessment & Plan Note (Signed)
Warm compress  abx per orders Refer to surgery rto prn

## 2017-10-16 NOTE — Progress Notes (Signed)
Subjective:  I acted as a Education administrator for [employer], [place]   Patient ID: Thomas [name], male    DOB: August 16, 1951, 66 y.o.   MRN: [id number]  Chief Complaint  Patient presents with  . Mass    Pt states he has a lump on the left side of his neck. Has been there "for years" but states it's becoming sore and red.   . Hypertension    Follow up from last visit    HPI  Patient is in today for bp check and c/o lump in neck -- it has been there a long time but has recently gotten bigger and more tender.  No drainage,  No fever.   Patient Care Team: Carollee Herter, Alferd Apa, DO as PCP - General (Family Medicine)   Past Medical History:  Diagnosis Date  . Cancer (HCC)    skin/squamous cell  . Gout   . Hypertension     Past Surgical History:  Procedure Laterality Date  . WISDOM TOOTH EXTRACTION     no sedation    Family History  Problem Relation Age of Onset  . Heart disease Mother 67  . Alcohol abuse Mother   . Depression Mother   . Cancer Sister        hodgkins, breast  . Hypertension Father   . Colon cancer Neg Hx   . Pancreatic cancer Neg Hx   . Rectal cancer Neg Hx   . Stomach cancer Neg Hx     Social History   Socioeconomic History  . Marital status: Married    Spouse name: Not on file  . Number of children: Not on file  . Years of education: Not on file  . Highest education level: Not on file  Social Needs  . Financial resource strain: Not on file  . Food insecurity - worry: Not on file  . Food insecurity - inability: Not on file  . Transportation needs - medical: Not on file  . Transportation needs - non-medical: Not on file  Occupational History  . Occupation: Pharmacist, hospital  Tobacco Use  . Smoking status: Former Smoker    Packs/day: 0.30    Years: 20.00    Pack years: 6.00    Types: Cigarettes    Last attempt to quit: 10/17/1994    Years since quitting: 23.0  . Smokeless tobacco: Never Used  . Tobacco comment: quit 20 yrs ago  Substance and Sexual  Activity  . Alcohol use: Yes    Alcohol/week: 1.2 oz    Types: 2 Glasses of wine per week  . Drug use: No  . Sexual activity: Yes  Other Topics Concern  . Not on file  Social History Narrative   Exercise 1 hour a day ---3-4 days a week    Outpatient Medications Prior to Visit  Medication Sig Dispense Refill  . b complex vitamins tablet Take 1 tablet by mouth daily.    Marland Kitchen FINACEA 15 % cream Use topical as directed 30 g 0  . MAGNESIUM PO Take 1 tablet by mouth daily.    . sildenafil (VIAGRA) 100 MG tablet Take 0.5-1 tablets (50-100 mg total) by mouth daily as needed for erectile dysfunction. 10 tablet 2  . amLODipine (NORVASC) 5 MG tablet Take 1 tablet (5 mg total) by mouth daily. 90 tablet 3  . oxyCODONE-acetaminophen (PERCOCET/ROXICET) 5-325 MG tablet TK 1 T PO Q 6 H PRN FOR P. DO NOT EXCEED MORE THAT 4000MG  OF TYLENOL  PER DAY  0  . tamsulosin (FLOMAX) 0.4 MG CAPS capsule TK 1 C PO QD  0  . colchicine (COLCRYS) 0.6 MG tablet Take 1 tablet (0.6 mg total) by mouth daily. (Patient not taking: Reported on 10/16/2017) 90 tablet 3  . traMADol (ULTRAM) 50 MG tablet Take 1 tablet (50 mg total) by mouth every 6 (six) hours as needed. (Patient not taking: Reported on 10/16/2017) 60 tablet 0   No facility-administered medications prior to visit.     Allergies  Allergen Reactions  . Losartan Cough    Review of Systems  Constitutional: Negative for chills, fever and malaise/fatigue.  HENT: Negative for congestion and hearing loss.   Eyes: Negative for discharge.  Respiratory: Negative for cough, sputum production and shortness of breath.   Cardiovascular: Negative for chest pain, palpitations and leg swelling.  Gastrointestinal: Negative for abdominal pain, blood in stool, constipation, diarrhea, heartburn, nausea and vomiting.  Genitourinary: Negative for dysuria, frequency, hematuria and urgency.  Musculoskeletal: Negative for back pain, falls and myalgias.  Skin: Negative for rash.    Neurological: Negative for dizziness, sensory change, loss of consciousness, weakness and headaches.  Endo/Heme/Allergies: Negative for environmental allergies. Does not bruise/bleed easily.  Psychiatric/Behavioral: Negative for depression and suicidal ideas. The patient is not nervous/anxious and does not have insomnia.        Objective:    Physical Exam  Constitutional: He is oriented to person, place, and time. Vital signs are normal. He appears well-developed and well-nourished. He is sleeping.  HENT:  Head: Normocephalic and atraumatic.  Mouth/Throat: Oropharynx is clear and moist.  Eyes: EOM are normal. Pupils are equal, round, and reactive to light.  Neck: Normal range of motion. Neck supple. No thyromegaly present.    Cardiovascular: Normal rate and regular rhythm.  No murmur heard. Pulmonary/Chest: Effort normal and breath sounds normal. No respiratory distress. He has no wheezes. He has no rales. He exhibits no tenderness.  Musculoskeletal: He exhibits no edema or tenderness.  Neurological: He is alert and oriented to person, place, and time.  Skin: Skin is warm and dry. There is erythema.  Psychiatric: He has a normal mood and affect. His behavior is normal. Judgment and thought content normal.  Nursing note and vitals reviewed.   BP (!) 190/80   Pulse 74   Temp 98 F (36.7 C) (Oral)   Ht 5' 9.6" (1.768 m)   Wt 210 lb (95.3 kg)   SpO2 98%   BMI 30.48 kg/m  Wt Readings from Last 3 Encounters:  10/16/17 210 lb (95.3 kg)  08/21/17 209 lb 6.4 oz (95 kg)  07/21/17 212 lb 3.2 oz (96.3 kg)   BP Readings from Last 3 Encounters:  10/16/17 (!) 190/80  08/21/17 (!) 184/102  07/21/17 (!) 164/88     Immunization History  Administered Date(s) Administered  . Influenza,inj,Quad PF,6+ Mos 07/21/2017  . Influenza-Unspecified 07/27/2015, 08/17/2016  . Tdap 06/06/2013, 05/09/2014  . Zoster Recombinat (Shingrix) 10/16/2017    Health Maintenance  Topic Date Due  . HIV  Screening  02/05/1968  . TETANUS/TDAP  05/09/2024  . COLONOSCOPY  10/30/2024  . INFLUENZA VACCINE  Completed  . Hepatitis C Screening  Completed    Lab Results  Component Value Date   WBC 8.3 11/14/2016   HGB 16.6 11/14/2016   HCT 49.5 11/14/2016   PLT 269 11/14/2016   GLUCOSE 90 07/21/2017   CHOL 218 (H) 07/21/2017   TRIG 194.0 (H) 07/21/2017   HDL 40.10 07/21/2017   LDLCALC 139 (H)  07/21/2017   ALT 63 (H) 07/21/2017   AST 30 07/21/2017   NA 140 07/21/2017   K 4.4 07/21/2017   CL 103 07/21/2017   CREATININE 1.01 07/21/2017   BUN 15 07/21/2017   CO2 30 07/21/2017   TSH 1.85 11/14/2016    Lab Results  Component Value Date   TSH 1.85 11/14/2016   Lab Results  Component Value Date   WBC 8.3 11/14/2016   HGB 16.6 11/14/2016   HCT 49.5 11/14/2016   MCV 84.9 11/14/2016   PLT 269 11/14/2016   Lab Results  Component Value Date   NA 140 07/21/2017   K 4.4 07/21/2017   CO2 30 07/21/2017   GLUCOSE 90 07/21/2017   BUN 15 07/21/2017   CREATININE 1.01 07/21/2017   BILITOT 0.5 07/21/2017   ALKPHOS 76 07/21/2017   AST 30 07/21/2017   ALT 63 (H) 07/21/2017   PROT 7.2 07/21/2017   ALBUMIN 4.6 07/21/2017   CALCIUM 9.7 07/21/2017   GFR 78.93 07/21/2017   Lab Results  Component Value Date   CHOL 218 (H) 07/21/2017   Lab Results  Component Value Date   HDL 40.10 07/21/2017   Lab Results  Component Value Date   LDLCALC 139 (H) 07/21/2017   Lab Results  Component Value Date   TRIG 194.0 (H) 07/21/2017   Lab Results  Component Value Date   CHOLHDL 5 07/21/2017   No results found for: HGBA1C       Assessment & Plan:   Problem List Items Addressed This Visit      Unprioritized   Epidermoid cyst of neck - Primary    Warm compress  abx per orders Refer to surgery rto prn       Relevant Medications   amoxicillin-clavulanate (AUGMENTIN) 875-125 MG tablet   Other Relevant Orders   Ambulatory referral to General Surgery   Essential hypertension    Inc  norvasc to 10 mg 24 h urine --per orders US kidneys  R/o RAS       Relevant Medications   amLODipine (NORVASC) 10 MG tablet    Other Visit Diagnoses    Hypertension, unspecified type       Relevant Medications   amLODipine (NORVASC) 10 MG tablet   Other Relevant Orders   US RENAL ARTERY DUPLEX COMPLETE   Need for shingles vaccine       Relevant Orders   Varicella-zoster vaccine IM (Shingrix) (Completed)      I have discontinued Travas Magan's oxyCODONE-acetaminophen, tamsulosin, and amLODipine. I am also having him start on amLODipine, amoxicillin-clavulanate, and NONFORMULARY OR COMPOUNDED ITEM. Additionally, I am having him maintain his b complex vitamins, MAGNESIUM PO, colchicine, FINACEA, traMADol, and sildenafil.  Meds ordered this encounter  Medications  . amLODipine (NORVASC) 10 MG tablet    Sig: Take 1 tablet (10 mg total) by mouth daily.    Dispense:  90 tablet    Refill:  3  . amoxicillin-clavulanate (AUGMENTIN) 875-125 MG tablet    Sig: Take 1 tablet by mouth 2 (two) times daily.    Dispense:  20 tablet    Refill:  0  . NONFORMULARY OR COMPOUNDED ITEM    Sig: chanca piedra  1 po qd for kidneys    Dispense:  30 each    Refill:  0    CMA served as scribe during this visit. History, Physical and Plan performed by medical provider. Documentation and orders reviewed and attested to.  Ann Held, DO

## 2017-10-16 NOTE — Patient Instructions (Addendum)
Epidermal Cyst An epidermal cyst is sometimes called an epidermal inclusion cyst or an infundibular cyst. It is a sac made of skin tissue. The sac contains a substance called keratin. Keratin is a protein that is normally secreted through the hair follicles. When keratin becomes trapped in the top layer of skin (epidermis), it can form an epidermal cyst. Epidermal cysts are usually found on the face, neck, trunk, and genitals. These cysts are usually harmless (benign), and they may not cause symptoms unless they become infected. It is important not to pop epidermal cysts yourself. What are the causes? This condition may be caused by:  A blocked hair follicle.  A hair that curls and re-enters the skin instead of growing straight out of the skin (ingrown hair).  A blocked pore.  Irritated skin.  An injury to the skin.  Certain conditions that are passed along from parent to child (inherited).  Human papillomavirus (HPV).  What increases the risk? The following factors may make you more likely to develop an epidermal cyst:  Having acne.  Being overweight.  Wearing tight clothing.  What are the signs or symptoms? The only symptom of this condition may be a small, painless lump underneath the skin. When an epidermal cyst becomes infected, symptoms may include:  Redness.  Inflammation.  Tenderness.  Warmth.  Fever.  Keratin draining from the cyst. Keratin may look like a grayish-white, bad-smelling substance.  Pus draining from the cyst.  How is this diagnosed? This condition is diagnosed with a physical exam. In some cases, you may have a sample of tissue (biopsy) taken from your cyst to be examined under a microscope or tested for bacteria. You may be referred to a health care provider who specializes in skin care (dermatologist). How is this treated? In many cases, epidermal cysts go away on their own without treatment. If a cyst becomes infected, treatment may  include:  Opening and draining the cyst. After draining, minor surgery to remove the rest of the cyst may be done.  Antibiotic medicine to help prevent infection.  Injections of medicines (steroids) that help to reduce inflammation.  Surgery to remove the cyst. Surgery may be done if: ? The cyst becomes large. ? The cyst bothers you. ? There is a chance that the cyst could turn into cancer.  Follow these instructions at home:  Take over-the-counter and prescription medicines only as told by your health care provider.  If you were prescribed an antibiotic, use it as told by your health care provider. Do not stop using the antibiotic even if you start to feel better.  Keep the area around your cyst clean and dry.  Wear loose, dry clothing.  Do not try to pop your cyst.  Avoid touching your cyst.  Check your cyst every day for signs of infection.  Keep all follow-up visits as told by your health care provider. This is important. How is this prevented?  Wear clean, dry, clothing.  Avoid wearing tight clothing.  Keep your skin clean and dry. Shower or take baths every day.  Wash your body with a benzoyl peroxide wash when you shower or bathe. Contact a health care provider if:  Your cyst develops symptoms of infection.  Your condition is not improving or is getting worse.  You develop a cyst that looks different from other cysts you have had.  You have a fever. Get help right away if:  Redness spreads from the cyst into the surrounding area. This information is   not intended to replace advice given to you by your health care provider. Make sure you discuss any questions you have with your health care provider. Document Released: 10/04/2004 Document Revised: 07/02/2016 Document Reviewed: 09/05/2015 Elsevier Interactive Patient Education  2018 [employer].  Hypertension Hypertension, commonly called high blood pressure, is when the force of blood pumping through the  arteries is too strong. The arteries are the blood vessels that carry blood from the heart throughout the body. Hypertension forces the heart to work harder to pump blood and may cause arteries to become narrow or stiff. Having untreated or uncontrolled hypertension can cause heart attacks, strokes, kidney disease, and other problems. A blood pressure reading consists of a higher number over a lower number. Ideally, your blood pressure should be below 120/80. The first ("top") number is called the systolic pressure. It is a measure of the pressure in your arteries as your heart beats. The second ("bottom") number is called the diastolic pressure. It is a measure of the pressure in your arteries as the heart relaxes. What are the causes? The cause of this condition is not known. What increases the risk? Some risk factors for high blood pressure are under your control. Others are not. Factors you can change  Smoking.  Having type 2 diabetes mellitus, high cholesterol, or both.  Not getting enough exercise or physical activity.  Being overweight.  Having too much fat, sugar, calories, or salt (sodium) in your diet.  Drinking too much alcohol. Factors that are difficult or impossible to change  Having chronic kidney disease.  Having a family history of high blood pressure.  Age. Risk increases with age.  Race. You may be at higher risk if you are African-American.  Gender. Men are at higher risk than women before age 67. After age 85, women are at higher risk than men.  Having obstructive sleep apnea.  Stress. What are the signs or symptoms? Extremely high blood pressure (hypertensive crisis) may cause:  Headache.  Anxiety.  Shortness of breath.  Nosebleed.  Nausea and vomiting.  Severe chest pain.  Jerky movements you cannot control (seizures).  How is this diagnosed? This condition is diagnosed by measuring your blood pressure while you are seated, with your arm  resting on a surface. The cuff of the blood pressure monitor will be placed directly against the skin of your upper arm at the level of your heart. It should be measured at least twice using the same arm. Certain conditions can cause a difference in blood pressure between your right and left arms. Certain factors can cause blood pressure readings to be lower or higher than normal (elevated) for a short period of time:  When your blood pressure is higher when you are in a health care provider's office than when you are at home, this is called white coat hypertension. Most people with this condition do not need medicines.  When your blood pressure is higher at home than when you are in a health care provider's office, this is called masked hypertension. Most people with this condition may need medicines to control blood pressure.  If you have a high blood pressure reading during one visit or you have normal blood pressure with other risk factors:  You may be asked to return on a different day to have your blood pressure checked again.  You may be asked to monitor your blood pressure at home for 1 week or longer.  If you are diagnosed with hypertension, you may  have other blood or imaging tests to help your health care provider understand your overall risk for other conditions. How is this treated? This condition is treated by making healthy lifestyle changes, such as eating healthy foods, exercising more, and reducing your alcohol intake. Your health care provider may prescribe medicine if lifestyle changes are not enough to get your blood pressure under control, and if:  Your systolic blood pressure is above 130.  Your diastolic blood pressure is above 80.  Your personal target blood pressure may vary depending on your medical conditions, your age, and other factors. Follow these instructions at home: Eating and drinking  Eat a diet that is high in fiber and potassium, and low in sodium,  added sugar, and fat. An example eating plan is called the DASH (Dietary Approaches to Stop Hypertension) diet. To eat this way: ? Eat plenty of fresh fruits and vegetables. Try to fill half of your plate at each meal with fruits and vegetables. ? Eat whole grains, such as whole wheat pasta, brown rice, or whole grain bread. Fill about one quarter of your plate with whole grains. ? Eat or drink low-fat dairy products, such as skim milk or low-fat yogurt. ? Avoid fatty cuts of meat, processed or cured meats, and poultry with skin. Fill about one quarter of your plate with lean proteins, such as fish, chicken without skin, beans, eggs, and tofu. ? Avoid premade and processed foods. These tend to be higher in sodium, added sugar, and fat.  Reduce your daily sodium intake. Most people with hypertension should eat less than 1,500 mg of sodium a day.  Limit alcohol intake to no more than 1 drink a day for nonpregnant women and 2 drinks a day for men. One drink equals 12 oz of beer, 5 oz of wine, or 1 oz of hard liquor. Lifestyle  Work with your health care provider to maintain a healthy body weight or to lose weight. Ask what an ideal weight is for you.  Get at least 30 minutes of exercise that causes your heart to beat faster (aerobic exercise) most days of the week. Activities may include walking, swimming, or biking.  Include exercise to strengthen your muscles (resistance exercise), such as pilates or lifting weights, as part of your weekly exercise routine. Try to do these types of exercises for 30 minutes at least 3 days a week.  Do not use any products that contain nicotine or tobacco, such as cigarettes and e-cigarettes. If you need help quitting, ask your health care provider.  Monitor your blood pressure at home as told by your health care provider.  Keep all follow-up visits as told by your health care provider. This is important. Medicines  Take over-the-counter and prescription  medicines only as told by your health care provider. Follow directions carefully. Blood pressure medicines must be taken as prescribed.  Do not skip doses of blood pressure medicine. Doing this puts you at risk for problems and can make the medicine less effective.  Ask your health care provider about side effects or reactions to medicines that you should watch for. Contact a health care provider if:  You think you are having a reaction to a medicine you are taking.  You have headaches that keep coming back (recurring).  You feel dizzy.  You have swelling in your ankles.  You have trouble with your vision. Get help right away if:  You develop a severe headache or confusion.  You have unusual weakness or  numbness.  You feel faint.  You have severe pain in your chest or abdomen.  You vomit repeatedly.  You have trouble breathing. Summary  Hypertension is when the force of blood pumping through your arteries is too strong. If this condition is not controlled, it may put you at risk for serious complications.  Your personal target blood pressure may vary depending on your medical conditions, your age, and other factors. For most people, a normal blood pressure is less than 120/80.  Hypertension is treated with lifestyle changes, medicines, or a combination of both. Lifestyle changes include weight loss, eating a healthy, low-sodium diet, exercising more, and limiting alcohol. This information is not intended to replace advice given to you by your health care provider. Make sure you discuss any questions you have with your health care provider. Document Released: 11/03/2005 Document Revised: 10/01/2016 Document Reviewed: 10/01/2016 Elsevier Interactive Patient Education  [employer].

## 2017-10-16 NOTE — Assessment & Plan Note (Signed)
Inc norvasc to 10 mg 24 h urine --per orders US kidneys  R/o RAS

## 2017-10-19 ENCOUNTER — Telehealth: Payer: Self-pay | Admitting: *Deleted

## 2017-10-19 NOTE — Telephone Encounter (Signed)
Copied from [place]. Topic: Quick Communication - See Telephone Encounter >> Oct 19, 2017  1:56 PM Burnis Medin, NT wrote: Pt called in and wanted to speak to the doctor about the referral he was giving. Pt would like for the doctor or the nurse to call him back

## 2017-10-20 NOTE — Telephone Encounter (Signed)
Is there anyway we can call to get him scheduled soon.  He states that his the spot on his neck is bothersome.

## 2017-11-19 ENCOUNTER — Other Ambulatory Visit: Payer: BLUE CROSS/BLUE SHIELD

## 2017-11-19 ENCOUNTER — Ambulatory Visit: Payer: BLUE CROSS/BLUE SHIELD | Admitting: Family Medicine

## 2017-11-19 DIAGNOSIS — I10 Essential (primary) hypertension: Secondary | ICD-10-CM

## 2017-11-22 LAB — METANEPHRINES, URINE, 24 HOUR
METANEPHRINES UR: 127 ug/(24.h) (ref 90–315)
Metaneph Total, Ur: 479 mcg/24 h (ref 224–832)
NORMETANEPHRINE 24H UR: 352 ug/(24.h) (ref 122–676)
VOLUME, URINE-VMAUR: 507 mL

## 2017-11-23 LAB — CATECHOLAMINES, FRACTIONATED, URINE, 24 HOUR
CALCULATED TOTAL (E+ NE): 56 ug/(24.h) (ref 26–121)
CREATININE, URINE MG/DAY-CATEUR: 1.01 g/(24.h) (ref 0.50–2.15)
DOPAMINE, 24 HR URINE: 214 ug/(24.h) (ref 52–480)
EPINEPHRINE, 24 HR URINE: 3 ug/(24.h) (ref 2–24)
Norepinephrine, 24 hr Ur: 53 mcg/24 h (ref 15–100)
Volume, Urine-VMAUR: 507 mL

## 2017-11-24 ENCOUNTER — Encounter: Payer: Self-pay | Admitting: *Deleted

## 2018-01-08 ENCOUNTER — Other Ambulatory Visit: Payer: Self-pay | Admitting: Family Medicine

## 2018-01-08 DIAGNOSIS — M25511 Pain in right shoulder: Secondary | ICD-10-CM

## 2018-01-08 DIAGNOSIS — M25512 Pain in left shoulder: Principal | ICD-10-CM

## 2018-01-11 NOTE — Telephone Encounter (Signed)
Requesting: tramadol Contract:  UDS: Last Visit: Next Visit: Last Refill:  Please Advise

## 2018-01-12 ENCOUNTER — Other Ambulatory Visit: Payer: Self-pay | Admitting: Family Medicine

## 2018-01-12 DIAGNOSIS — M25512 Pain in left shoulder: Principal | ICD-10-CM

## 2018-01-12 DIAGNOSIS — M25511 Pain in right shoulder: Secondary | ICD-10-CM

## 2018-01-12 MED ORDER — TRAMADOL HCL 50 MG PO TABS: 50.0000 mg | ORAL_TABLET | Freq: Four times a day (QID) | ORAL | 0 refills | Status: DC | PRN

## 2018-01-12 NOTE — Telephone Encounter (Signed)
Requesting: tramadol Contract:  none UDS: none Last Visit: 10/16/17 Next Visit: none Last Refill: 07/21/17  Please Advise

## 2018-01-13 NOTE — Telephone Encounter (Signed)
rx faxed to pharmacy

## 2018-02-22 ENCOUNTER — Ambulatory Visit: Payer: BLUE CROSS/BLUE SHIELD | Admitting: Medical

## 2018-02-22 ENCOUNTER — Encounter: Payer: Self-pay | Admitting: Medical

## 2018-02-22 VITALS — BP 172/76 | HR 78 | Temp 98.6°F | Resp 16 | Ht 69.0 in | Wt 211.8 lb

## 2018-02-22 DIAGNOSIS — L989 Disorder of the skin and subcutaneous tissue, unspecified: Secondary | ICD-10-CM

## 2018-02-22 DIAGNOSIS — I10 Essential (primary) hypertension: Secondary | ICD-10-CM | POA: Diagnosis not present

## 2018-02-22 DIAGNOSIS — R51 Headache: Secondary | ICD-10-CM | POA: Diagnosis not present

## 2018-02-22 DIAGNOSIS — R0981 Nasal congestion: Secondary | ICD-10-CM | POA: Diagnosis not present

## 2018-02-22 DIAGNOSIS — R519 Headache, unspecified: Secondary | ICD-10-CM

## 2018-02-22 DIAGNOSIS — J0100 Acute maxillary sinusitis, unspecified: Secondary | ICD-10-CM

## 2018-02-22 MED ORDER — FLUTICASONE PROPIONATE 50 MCG/ACT NA SUSP: 2.0000 | Freq: Every day | NASAL | 1 refills | Status: DC

## 2018-02-22 NOTE — Patient Instructions (Addendum)
Your appear to have a sinus infection. Please use your augmentin antibiotic for the infection. To help with the nasal congestion I prescribed flonase nasal steroid. If you have any significant cough then notify us. For temporal region HA will get sed rate.  Rest, hydrate, tylenol for fever.  Offered option of different bp meds but declined. Do recommend follow up with pcp  Follow up in 7 days or as needed.

## 2018-02-22 NOTE — Progress Notes (Addendum)
Subjective:    Patient ID: Thomas [name], male    DOB: 28-Jan-1951, 67 y.o.   MRN: [id number]  HPI  Pt in with nasal congestion, coughing and sneezing for 2-3 weeks. But last 3-4 days left side sinus pressure and ha. Left side upper teeth area hurting.   When he takes shower will blow out thick yellow mucus from his nose.  Pt tried some corcidin hbp but did not help his symptoms.  Pt states today bp is about the same as it has been before. Pt used to be on losartan.  Now on amlodipine 10 mg a day.  Also notes skin lesion left forearm. Present for one year. Growing in size.   Review of Systems  Constitutional: Negative for chills, fatigue and fever.  HENT: Positive for congestion, postnasal drip, sinus pressure and sinus pain. Negative for ear pain, hearing loss and sore throat.   Respiratory: Negative for cough, chest tightness, shortness of breath and wheezing.   Cardiovascular: Negative for chest pain and palpitations.  Gastrointestinal: Negative for abdominal pain.  Musculoskeletal: Negative for back pain and neck pain.  Skin: Negative for rash.       See hpi.  Neurological: Positive for headaches. Negative for dizziness, speech difficulty, weakness, light-headedness and numbness.       See hpi. Some temporal region but also sinus pressure on that side.  Hematological: Negative for adenopathy. Does not bruise/bleed easily.  Psychiatric/Behavioral: Negative for behavioral problems, confusion, dysphoric mood, hallucinations, sleep disturbance and suicidal ideas. The patient is not nervous/anxious.    Past Medical History:  Diagnosis Date  . Cancer (HCC)    skin/squamous cell  . Gout   . Hypertension      Social History   Socioeconomic History  . Marital status: Married    Spouse name: Not on file  . Number of children: Not on file  . Years of education: Not on file  . Highest education level: Not on file  Occupational History  . Occupation: Pharmacist, hospital  Social Needs  .  Financial resource strain: Not on file  . Food insecurity:    Worry: Not on file    Inability: Not on file  . Transportation needs:    Medical: Not on file    Non-medical: Not on file  Tobacco Use  . Smoking status: Former Smoker    Packs/day: 0.30    Years: 20.00    Pack years: 6.00    Types: Cigarettes    Last attempt to quit: 10/17/1994    Years since quitting: 23.3  . Smokeless tobacco: Never Used  . Tobacco comment: quit 20 yrs ago  Substance and Sexual Activity  . Alcohol use: Yes    Alcohol/week: 1.2 oz    Types: 2 Glasses of wine per week  . Drug use: No  . Sexual activity: Yes  Lifestyle  . Physical activity:    Days per week: Not on file    Minutes per session: Not on file  . Stress: Not on file  Relationships  . Social connections:    Talks on phone: Not on file    Gets together: Not on file    Attends religious service: Not on file    Active member of club or organization: Not on file    Attends meetings of clubs or organizations: Not on file    Relationship status: Not on file  . Intimate partner violence:    Fear of current or ex partner: Not on file  Emotionally abused: Not on file    Physically abused: Not on file    Forced sexual activity: Not on file  Other Topics Concern  . Not on file  Social History Narrative   Exercise 1 hour a day ---3-4 days a week    Past Surgical History:  Procedure Laterality Date  . WISDOM TOOTH EXTRACTION     no sedation    Family History  Problem Relation Age of Onset  . Heart disease Mother 67  . Alcohol abuse Mother   . Depression Mother   . Cancer Sister        hodgkins, breast  . Hypertension Father   . Colon cancer Neg Hx   . Pancreatic cancer Neg Hx   . Rectal cancer Neg Hx   . Stomach cancer Neg Hx     Allergies  Allergen Reactions  . Losartan Cough    Current Outpatient Medications on File Prior to Visit  Medication Sig Dispense Refill  . amLODipine (NORVASC) 10 MG tablet Take 1 tablet  (10 mg total) by mouth daily. 90 tablet 3  . amoxicillin-clavulanate (AUGMENTIN) 875-125 MG tablet Take 1 tablet by mouth 2 (two) times daily. 20 tablet 0  . b complex vitamins tablet Take 1 tablet by mouth daily.    . colchicine (COLCRYS) 0.6 MG tablet Take 1 tablet (0.6 mg total) by mouth daily. 90 tablet 3  . FINACEA 15 % cream Use topical as directed 30 g 0  . MAGNESIUM PO Take 1 tablet by mouth daily.    . NONFORMULARY OR COMPOUNDED ITEM chanca piedra  1 po qd for kidneys 30 each 0  . sildenafil (VIAGRA) 100 MG tablet Take 0.5-1 tablets (50-100 mg total) by mouth daily as needed for erectile dysfunction. 10 tablet 2  . traMADol (ULTRAM) 50 MG tablet Take 1 tablet (50 mg total) by mouth every 6 (six) hours as needed. 60 tablet 0   No current facility-administered medications on file prior to visit.     BP (!) 172/76   Pulse 78   Temp 98.6 F (37 C) (Oral)   Resp 16   Ht 5\' 9"  (1.753 m)   Wt 211 lb 12.8 oz (96.1 kg)   SpO2 98%   BMI 31.28 kg/m       Objective:   Physical Exam  General  Mental Status - Alert. General Appearance - Well groomed. Not in acute distress.  Skin Rashes- No Rashes.  HEENT Head- Normal. Ear Auditory Canal - Left- Normal. Right - Normal.Tympanic Membrane- Left- Normal. Right- Normal. Eye Sclera/Conjunctiva- Left- Normal. Right- Normal. Nose & Sinuses Nasal Mucosa- Left-  Boggy and Congested. Right-  Boggy and  Congested.Bilateral maxillary and frontal sinus pressure. Mouth & Throat Lips: Upper Lip- Normal: no dryness, cracking, pallor, cyanosis, or vesicular eruption. Lower Lip-Normal: no dryness, cracking, pallor, cyanosis or vesicular eruption. Buccal Mucosa- Bilateral- No Aphthous ulcers. Oropharynx- No Discharge or Erythema. Tonsils: Characteristics- Bilateral- No Erythema or Congestion. Size/Enlargement- Bilateral- No enlargement. Discharge- bilateral-None.  Neck Neck- Supple. No Masses.   Chest and Lung Exam Auscultation: Breath  Sounds:-Clear even and unlabored.  Cardiovascular Auscultation:Rythm- Regular, rate and rhythm. Murmurs & Other Heart Sounds:Ausculatation of the heart reveal- No Murmurs.  Lymphatic Head & Neck General Head & Neck Lymphatics: Bilateral: Description- No Localized lymphadenopathy.   Neurologic Cranial Nerve exam:- CN III-XII intact(No nystagmus), symmetric smile. Strength:- 5/5 equal and symmetric strength both upper and lower extremities.  Left forearm- 6 mm raised skin lesion. Uniform in  color.(possible dermatofibroma)       Assessment & Plan:  Your appear to have a sinus infection. Please use your augmentin antibiotic for the infection. To help with the nasal congestion I prescribed flonase  nasal steroid. If you have any significant cough then notify us.  For temporal region HA will get sed rate.  Rest, hydrate, tylenol for fever.  Offered option of different bp meds but declined. Do recommend follow up with pcp(pt expresses desire to discuss with pcp)  Follow up in 7 days or as needed.   Mackie Pai, PA-C

## 2018-02-23 LAB — SEDIMENTATION RATE: SED RATE: 7 mm/h (ref 0–20)

## 2018-03-15 ENCOUNTER — Encounter: Payer: Self-pay | Admitting: Medical

## 2018-03-15 ENCOUNTER — Other Ambulatory Visit: Payer: Self-pay | Admitting: Family Medicine

## 2018-03-15 ENCOUNTER — Telehealth: Payer: Self-pay | Admitting: Medical

## 2018-03-15 DIAGNOSIS — M25512 Pain in left shoulder: Secondary | ICD-10-CM

## 2018-03-15 DIAGNOSIS — M25511 Pain in right shoulder: Secondary | ICD-10-CM

## 2018-03-15 DIAGNOSIS — Z79899 Other long term (current) drug therapy: Secondary | ICD-10-CM

## 2018-03-15 MED ORDER — HYDROCODONE-HOMATROPINE 5-1.5 MG/5ML PO SYRP: 5.0000 mL | ORAL_SOLUTION | Freq: Four times a day (QID) | ORAL | 0 refills | Status: DC | PRN

## 2018-03-15 NOTE — Telephone Encounter (Signed)
Last tramadol RX: 01/12/18, #60. No longer on medication list. Last OV: 02/22/18 acute visit Next OV: none scheduled UDS: not on file CSC: not on file CSR: No discrepancies identified

## 2018-03-15 NOTE — Telephone Encounter (Signed)
rx hycodan sent to pt pharmacy. See my chart response note to pt after his request for cough med.

## 2018-03-16 ENCOUNTER — Encounter: Payer: Self-pay | Admitting: *Deleted

## 2018-03-16 NOTE — Telephone Encounter (Signed)
Mychart message sent for patient to call and schedule appointment for urine drug screen and sign contract.

## 2018-03-16 NOTE — Telephone Encounter (Deleted)
Filled by edward

## 2018-03-16 NOTE — Telephone Encounter (Signed)
Ok to refill but need uds and contract

## 2018-03-26 DIAGNOSIS — R1084 Generalized abdominal pain: Secondary | ICD-10-CM | POA: Diagnosis not present

## 2018-03-26 DIAGNOSIS — I10 Essential (primary) hypertension: Secondary | ICD-10-CM | POA: Insufficient documentation

## 2018-03-26 DIAGNOSIS — R112 Nausea with vomiting, unspecified: Secondary | ICD-10-CM | POA: Diagnosis not present

## 2018-03-26 DIAGNOSIS — Z79899 Other long term (current) drug therapy: Secondary | ICD-10-CM | POA: Insufficient documentation

## 2018-03-26 DIAGNOSIS — Z87442 Personal history of urinary calculi: Secondary | ICD-10-CM | POA: Insufficient documentation

## 2018-03-26 DIAGNOSIS — N23 Unspecified renal colic: Secondary | ICD-10-CM | POA: Insufficient documentation

## 2018-03-26 DIAGNOSIS — Z87891 Personal history of nicotine dependence: Secondary | ICD-10-CM | POA: Insufficient documentation

## 2018-03-26 DIAGNOSIS — R1032 Left lower quadrant pain: Secondary | ICD-10-CM | POA: Diagnosis present

## 2018-03-27 ENCOUNTER — Other Ambulatory Visit: Payer: Self-pay

## 2018-03-27 ENCOUNTER — Encounter (HOSPITAL_COMMUNITY): Payer: Self-pay | Admitting: Emergency Medicine

## 2018-03-27 ENCOUNTER — Emergency Department (HOSPITAL_COMMUNITY)
Admission: EM | Admit: 2018-03-27 | Discharge: 2018-03-27 | Disposition: A | Discharge status: Home / Self Care | Payer: BLUE CROSS/BLUE SHIELD | Attending: Emergency Medicine | Admitting: Emergency Medicine

## 2018-03-27 DIAGNOSIS — N23 Unspecified renal colic: Secondary | ICD-10-CM

## 2018-03-27 LAB — URINALYSIS, ROUTINE W REFLEX MICROSCOPIC
Bacteria, UA: NONE SEEN
Bilirubin Urine: NEGATIVE
Glucose, UA: NEGATIVE mg/dL
KETONES UR: NEGATIVE mg/dL
Leukocytes, UA: NEGATIVE
Nitrite: NEGATIVE
PROTEIN: NEGATIVE mg/dL
Specific Gravity, Urine: 1.019 (ref 1.005–1.030)
pH: 6 (ref 5.0–8.0)

## 2018-03-27 LAB — COMPREHENSIVE METABOLIC PANEL
ALBUMIN: 4.5 g/dL (ref 3.5–5.0)
ALK PHOS: 88 U/L (ref 38–126)
ALT: 40 U/L (ref 17–63)
ANION GAP: 12 (ref 5–15)
AST: 26 U/L (ref 15–41)
BUN: 16 mg/dL (ref 6–20)
CALCIUM: 9.1 mg/dL (ref 8.9–10.3)
CHLORIDE: 100 mmol/L — AB (ref 101–111)
CO2: 24 mmol/L (ref 22–32)
Creatinine, Ser: 1.05 mg/dL (ref 0.61–1.24)
GFR calc Af Amer: >60 mL/min (ref >60)
GFR calc non Af Amer: >60 mL/min (ref >60)
GLUCOSE: 172 mg/dL — AB (ref 65–99)
Potassium: 3.7 mmol/L (ref 3.5–5.1)
Sodium: 136 mmol/L (ref 135–145)
Total Bilirubin: 0.6 mg/dL (ref 0.3–1.2)
Total Protein: 7.8 g/dL (ref 6.5–8.1)

## 2018-03-27 LAB — CBC
HEMATOCRIT: 48.1 % (ref 39.0–52.0)
HEMOGLOBIN: 15.9 g/dL (ref 13.0–17.0)
MCH: 28.8 pg (ref 26.0–34.0)
MCHC: 33.1 g/dL (ref 30.0–36.0)
MCV: 87.1 fL (ref 78.0–100.0)
Platelets: 257 10*3/uL (ref 150–400)
RBC: 5.52 MIL/uL (ref 4.22–5.81)
RDW: 13.6 % (ref 11.5–15.5)
WBC: 11.6 10*3/uL — ABNORMAL HIGH (ref 4.0–10.5)

## 2018-03-27 LAB — LIPASE, BLOOD: LIPASE: 28 U/L (ref 11–51)

## 2018-03-27 MED ORDER — ONDANSETRON HCL 4 MG/2ML IJ SOLN
4.0000 mg | Freq: Once | INTRAMUSCULAR | Status: AC | PRN
  Administered 2018-03-27: 4 mg via INTRAVENOUS
  Filled 2018-03-27: qty 2

## 2018-03-27 MED ORDER — FENTANYL CITRATE (PF) 100 MCG/2ML IJ SOLN
50.0000 ug | INTRAMUSCULAR | Status: DC | PRN
  Administered 2018-03-27: 50 ug via INTRAVENOUS
  Filled 2018-03-27: qty 2

## 2018-03-27 MED ORDER — FENTANYL CITRATE (PF) 100 MCG/2ML IJ SOLN: 50.0000 ug | INTRAMUSCULAR | Status: DC | PRN

## 2018-03-27 MED ORDER — KETOROLAC TROMETHAMINE 15 MG/ML IJ SOLN
15.0000 mg | Freq: Once | INTRAMUSCULAR | Status: AC
  Administered 2018-03-27: 15 mg via INTRAVENOUS
  Filled 2018-03-27: qty 1

## 2018-03-27 MED ORDER — HYDROCODONE-ACETAMINOPHEN 5-325 MG PO TABS: 2.0000 | ORAL_TABLET | ORAL | 0 refills | Status: DC | PRN

## 2018-03-27 MED ORDER — ONDANSETRON 4 MG PO TBDP: 4.0000 mg | ORAL_TABLET | Freq: Once | ORAL | Status: DC | PRN

## 2018-03-27 MED ORDER — KETOROLAC TROMETHAMINE 10 MG PO TABS: 10.0000 mg | ORAL_TABLET | Freq: Four times a day (QID) | ORAL | 0 refills | Status: DC | PRN

## 2018-03-27 MED ORDER — ONDANSETRON 4 MG PO TBDP: 4.0000 mg | ORAL_TABLET | Freq: Three times a day (TID) | ORAL | 0 refills | Status: DC | PRN

## 2018-03-27 NOTE — ED Provider Notes (Signed)
[facility] DEPT Provider Note   CSN: [id number] Arrival date & time: 03/26/18  2337     History   Chief Complaint Chief Complaint  Patient presents with  . Abdominal Pain  . Nausea  . Back Pain    HPI Thomas [name] is a 67 y.o. male.  HPI 67 year old male comes in with chief complaint of left-sided flank pain.  Patient has history of kidney stones and states that his pain is similar to his kidney stone.  He reports that his pain started around 3 PM, and since then the pain has been waxing and waning in intensity but has been constant.  Close to midnight the pain got intense and unbearable with associated nausea and vomiting, therefore patient decided to come to the ER.  Patient was given IV fentanyl at triage and is feeling better. Patient denies any pain with urination, blood in the urine.  No history of diverticulitis or bloody stools.   Past Medical History:  Diagnosis Date  . Cancer (HCC)    skin/squamous cell  . Gout   . Hypertension     Patient Active Problem List   Diagnosis Date Noted  . Epidermoid cyst of neck 10/16/2017  . Essential hypertension 07/21/2017  . Gout of multiple sites 07/21/2017  . Preventative health care 11/14/2016  . Pain in joint, shoulder region 12/11/2015  . Lower abdominal pain 08/27/2015  . URI (upper respiratory infection) 11/08/2014  . Gout 01/19/2014  . Hypertension not at goal 01/19/2014    Past Surgical History:  Procedure Laterality Date  . WISDOM TOOTH EXTRACTION     no sedation        Home Medications    Prior to Admission medications   Medication Sig Start Date End Date Taking? Authorizing Provider  amLODipine (NORVASC) 10 MG tablet Take 1 tablet (10 mg total) by mouth daily. 10/16/17   Ann Held, DO  amoxicillin-clavulanate (AUGMENTIN) 875-125 MG tablet Take 1 tablet by mouth 2 (two) times daily. 10/16/17   Roma Schanz R, DO  b complex vitamins tablet Take 1 tablet  by mouth daily.    [provider]  colchicine (COLCRYS) 0.6 MG tablet Take 1 tablet (0.6 mg total) by mouth daily. 11/14/16   Ann Held, DO  FINACEA 15 % cream Use topical as directed 07/14/17   Carollee Herter, Kendrick Fries R, DO  fluticasone (FLONASE) 50 MCG/ACT nasal spray Place 2 sprays into both nostrils daily. 02/22/18   Saguier, Percell Miller, PA-C  HYDROcodone-acetaminophen (NORCO/VICODIN) 5-325 MG tablet Take 2 tablets by mouth every 4 (four) hours as needed. 03/27/18   Varney Biles, MD  HYDROcodone-homatropine (HYCODAN) 5-1.5 MG/5ML syrup Take 5 mLs by mouth every 6 (six) hours as needed. 03/15/18   Saguier, Percell Miller, PA-C  ketorolac (TORADOL) 10 MG tablet Take 1 tablet (10 mg total) by mouth every 6 (six) hours as needed. 03/27/18   Varney Biles, MD  MAGNESIUM PO Take 1 tablet by mouth daily.    [provider]  NONFORMULARY OR COMPOUNDED ITEM chanca piedra  1 po qd for kidneys 10/16/17   Carollee Herter, Yvonne R, DO  ondansetron (ZOFRAN ODT) 4 MG disintegrating tablet Take 1 tablet (4 mg total) by mouth every 8 (eight) hours as needed for nausea or vomiting. 03/27/18   Varney Biles, MD  sildenafil (VIAGRA) 100 MG tablet Take 0.5-1 tablets (50-100 mg total) by mouth daily as needed for erectile dysfunction. 07/21/17   Ann Held, DO  Family History Family History  Problem Relation Age of Onset  . Heart disease Mother 41  . Alcohol abuse Mother   . Depression Mother   . Cancer Sister        hodgkins, breast  . Hypertension Father   . Colon cancer Neg Hx   . Pancreatic cancer Neg Hx   . Rectal cancer Neg Hx   . Stomach cancer Neg Hx     Social History Social History   Tobacco Use  . Smoking status: Former Smoker    Packs/day: 0.30    Years: 20.00    Pack years: 6.00    Types: Cigarettes    Last attempt to quit: 10/17/1994    Years since quitting: 23.4  . Smokeless tobacco: Never Used  . Tobacco comment: quit 20 yrs ago  Substance Use Topics  .  Alcohol use: Yes    Alcohol/week: 1.2 oz    Types: 2 Glasses of wine per week  . Drug use: No     Allergies   Losartan   Review of Systems Review of Systems  Constitutional: Positive for activity change.  Gastrointestinal: Positive for abdominal pain, nausea and vomiting.  All other systems reviewed and are negative.    Physical Exam Updated Vital Signs BP (!) 177/90 (BP Location: Left Arm)   Pulse 61   Temp 99.1 F (37.3 C) (Oral)   Resp 18   Ht 5\' 11"  (1.803 m)   Wt 95.3 kg (210 lb)   SpO2 98%   BMI 29.29 kg/m   Physical Exam  Constitutional: He is oriented to person, place, and time. He appears well-developed.  HENT:  Head: Atraumatic.  Neck: Neck supple.  Cardiovascular: Normal rate.  Pulmonary/Chest: Effort normal.  Abdominal: There is generalized tenderness.  Neurological: He is alert and oriented to person, place, and time.  Skin: Skin is warm.  Nursing note and vitals reviewed.    ED Treatments / Results  Labs (all labs ordered are listed, but only abnormal results are displayed) Labs Reviewed  COMPREHENSIVE METABOLIC PANEL - Abnormal; Notable for the following components:      Result Value   Chloride 100 (*)    Glucose, Bld 172 (*)    All other components within normal limits  CBC - Abnormal; Notable for the following components:   WBC 11.6 (*)    All other components within normal limits  URINALYSIS, ROUTINE W REFLEX MICROSCOPIC - Abnormal; Notable for the following components:   Hgb urine dipstick LARGE (*)    RBC / HPF >50 (*)    All other components within normal limits  LIPASE, BLOOD    EKG None  Radiology No results found.  Procedures Procedures (including critical care time)  Medications Ordered in ED Medications  fentaNYL (SUBLIMAZE) injection 50 mcg (50 mcg Intravenous Given 03/27/18 0033)  ondansetron (ZOFRAN) injection 4 mg (4 mg Intravenous Given 03/27/18 0033)  ketorolac (TORADOL) 15 MG/ML injection 15 mg (15 mg  Intravenous Given 03/27/18 0258)     Initial Impression / Assessment and Plan / ED Course  I have reviewed the triage vital signs and the nursing notes.  Pertinent labs & imaging results that were available during my care of the patient were reviewed by me and considered in my medical decision making (see chart for details).  Clinical Course as of Mar 27 414  Sat Mar 27, 2018  0345 Post Toradol patient's pain has completely resolved.  He feels a lot better and wants to go  home.  Strict ER return precautions discussed with the patient.  He will return to the ED if his pain gets worse or if he starts having bloody stools or fevers.   [AN]    Clinical Course User Index [AN] Varney Biles, MD    67 year old male comes in with chief complaint of abdominal pain.  Patient has history of renal stones, and states that the pain is similar to his prior stone.  Patient does not have known history of diverticulosis and on my exam he does not have focal tenderness in the left lower quadrant only.  Clinically, suspicion is high for renal colic.  UA is reviewed and there is large hemoglobin in the dipstick along with greater than 50 RBCs in the microscopic UA evaluation.  Patient's pain is still moderately severe.  We will give patient Toradol and reassess.  Final Clinical Impressions(s) / ED Diagnoses   Final diagnoses:  Ureteral colic    ED Discharge Orders        Ordered    ketorolac (TORADOL) 10 MG tablet  Every 6 hours PRN     03/27/18 0344    HYDROcodone-acetaminophen (NORCO/VICODIN) 5-325 MG tablet  Every 4 hours PRN     03/27/18 0344    ondansetron (ZOFRAN ODT) 4 MG disintegrating tablet  Every 8 hours PRN     03/27/18 0344       Varney Biles, MD 03/27/18 [phone]

## 2018-03-27 NOTE — ED Notes (Signed)
Requested urine from patients

## 2018-03-27 NOTE — ED Triage Notes (Signed)
Patient complaining of left lower abdominal pain, left lower back pain, and nausea. Patient states it started around 3pm today.

## 2018-03-27 NOTE — ED Notes (Signed)
Pt dressed into gown.

## 2018-03-27 NOTE — Discharge Instructions (Addendum)
We saw you in the ER for the abdominal pain. We are pretty sure you have a kidney stone based on your history - thus we didn't order any formal studies. We were able to get your pain is relative control, and we feel comfortable sending you home.  Take the meds prescribed. Set up an appointment with the Urologist. If the pain is unbearable, you start having fevers, chills, and are unable to keep any meds down - then return to the ER.

## 2018-04-02 ENCOUNTER — Other Ambulatory Visit: Payer: Self-pay | Admitting: Urology

## 2018-04-13 ENCOUNTER — Other Ambulatory Visit: Payer: Self-pay | Admitting: Urology

## 2018-04-14 ENCOUNTER — Encounter (HOSPITAL_BASED_OUTPATIENT_CLINIC_OR_DEPARTMENT_OTHER): Payer: Self-pay | Admitting: *Deleted

## 2018-04-14 ENCOUNTER — Other Ambulatory Visit: Payer: Self-pay

## 2018-04-14 NOTE — Progress Notes (Signed)
Spoke w/ pt via phone for pre-op interview.  Npo after mn w/ exception clear liquids until 0630 (no cream/milk products).  Arrive at 1030.  Current lab results, dated 03-27-2018, and ekg in chart and epic.   Will take norvasc am dos w/ sips of water and if needed take norco/ zofran .

## 2018-04-16 ENCOUNTER — Ambulatory Visit (HOSPITAL_BASED_OUTPATIENT_CLINIC_OR_DEPARTMENT_OTHER)
Admission: RE | Admit: 2018-04-16 | Discharge: 2018-04-16 | Disposition: A | Discharge status: Home / Self Care | Payer: BLUE CROSS/BLUE SHIELD | Source: Ambulatory Visit | Attending: Urology | Admitting: Urology

## 2018-04-16 ENCOUNTER — Encounter (HOSPITAL_BASED_OUTPATIENT_CLINIC_OR_DEPARTMENT_OTHER): Payer: Self-pay | Admitting: Anesthesiology

## 2018-04-16 ENCOUNTER — Encounter (HOSPITAL_BASED_OUTPATIENT_CLINIC_OR_DEPARTMENT_OTHER)
Admission: RE | Disposition: A | Discharge status: Home / Self Care | Payer: Self-pay | Source: Ambulatory Visit | Attending: Urology

## 2018-04-16 ENCOUNTER — Ambulatory Visit (HOSPITAL_BASED_OUTPATIENT_CLINIC_OR_DEPARTMENT_OTHER): Payer: BLUE CROSS/BLUE SHIELD | Admitting: Anesthesiology

## 2018-04-16 DIAGNOSIS — I10 Essential (primary) hypertension: Secondary | ICD-10-CM | POA: Diagnosis not present

## 2018-04-16 DIAGNOSIS — Z79899 Other long term (current) drug therapy: Secondary | ICD-10-CM | POA: Insufficient documentation

## 2018-04-16 DIAGNOSIS — N200 Calculus of kidney: Secondary | ICD-10-CM

## 2018-04-16 DIAGNOSIS — N201 Calculus of ureter: Secondary | ICD-10-CM | POA: Diagnosis not present

## 2018-04-16 DIAGNOSIS — Z87891 Personal history of nicotine dependence: Secondary | ICD-10-CM | POA: Diagnosis not present

## 2018-04-16 DIAGNOSIS — M109 Gout, unspecified: Secondary | ICD-10-CM | POA: Diagnosis not present

## 2018-04-16 HISTORY — DX: Personal history of malignant neoplasm, unspecified: Z98.890

## 2018-04-16 HISTORY — DX: Calculus of ureter: N20.1

## 2018-04-16 HISTORY — DX: Personal history of urinary calculi: Z87.442

## 2018-04-16 HISTORY — DX: Other specified postprocedural states: Z85.9

## 2018-04-16 HISTORY — DX: Urgency of urination: R39.15

## 2018-04-16 HISTORY — DX: Hyperlipidemia, unspecified: E78.5

## 2018-04-16 HISTORY — DX: Presence of spectacles and contact lenses: Z97.3

## 2018-04-16 HISTORY — PX: HOLMIUM LASER APPLICATION: SHX5852

## 2018-04-16 HISTORY — PX: CYSTOSCOPY/RETROGRADE/URETEROSCOPY/STONE EXTRACTION WITH BASKET: SHX5317

## 2018-04-16 HISTORY — DX: Male erectile dysfunction, unspecified: N52.9

## 2018-04-16 HISTORY — DX: Chronic gout, unspecified, without tophus (tophi): M1A.9XX0

## 2018-04-16 SURGERY — CYSTOSCOPY, WITH CALCULUS REMOVAL USING BASKET
Anesthesia: General | Site: Renal | Laterality: Left

## 2018-04-16 MED ORDER — FENTANYL CITRATE (PF) 100 MCG/2ML IJ SOLN
INTRAMUSCULAR | Status: AC
  Filled 2018-04-16: qty 2

## 2018-04-16 MED ORDER — BELLADONNA ALKALOIDS-OPIUM 16.2-60 MG RE SUPP
RECTAL | Status: AC
  Filled 2018-04-16: qty 1

## 2018-04-16 MED ORDER — ONDANSETRON HCL 4 MG/2ML IJ SOLN
INTRAMUSCULAR | Status: DC | PRN
  Administered 2018-04-16: 4 mg via INTRAVENOUS

## 2018-04-16 MED ORDER — PROPOFOL 10 MG/ML IV BOLUS
INTRAVENOUS | Status: AC
  Filled 2018-04-16: qty 20

## 2018-04-16 MED ORDER — TRAMADOL HCL 50 MG PO TABS: 50.0000 mg | ORAL_TABLET | Freq: Four times a day (QID) | ORAL | 0 refills | Status: DC | PRN

## 2018-04-16 MED ORDER — FENTANYL CITRATE (PF) 100 MCG/2ML IJ SOLN
INTRAMUSCULAR | Status: DC | PRN
  Administered 2018-04-16: 50 ug via INTRAVENOUS
  Administered 2018-04-16: 25 ug via INTRAVENOUS
  Administered 2018-04-16: 50 ug via INTRAVENOUS

## 2018-04-16 MED ORDER — LACTATED RINGERS IV SOLN
INTRAVENOUS | Status: DC
  Administered 2018-04-16: 1000 mL via INTRAVENOUS
  Administered 2018-04-16: 15:00:00 via INTRAVENOUS
  Filled 2018-04-16: qty 1000

## 2018-04-16 MED ORDER — CEFAZOLIN SODIUM-DEXTROSE 2-4 GM/100ML-% IV SOLN
INTRAVENOUS | Status: AC
  Filled 2018-04-16: qty 100

## 2018-04-16 MED ORDER — DEXAMETHASONE SODIUM PHOSPHATE 10 MG/ML IJ SOLN
INTRAMUSCULAR | Status: AC
  Filled 2018-04-16: qty 1

## 2018-04-16 MED ORDER — PHENAZOPYRIDINE HCL 100 MG PO TABS
ORAL_TABLET | ORAL | Status: AC
  Filled 2018-04-16: qty 2

## 2018-04-16 MED ORDER — PHENAZOPYRIDINE HCL 200 MG PO TABS
200.0000 mg | ORAL_TABLET | Freq: Three times a day (TID) | ORAL | Status: DC
  Administered 2018-04-16: 100 mg via ORAL
  Filled 2018-04-16: qty 1

## 2018-04-16 MED ORDER — MIDAZOLAM HCL 5 MG/5ML IJ SOLN
INTRAMUSCULAR | Status: DC | PRN
  Administered 2018-04-16: 2 mg via INTRAVENOUS

## 2018-04-16 MED ORDER — LIDOCAINE 2% (20 MG/ML) 5 ML SYRINGE
INTRAMUSCULAR | Status: AC
  Filled 2018-04-16: qty 5

## 2018-04-16 MED ORDER — PHENAZOPYRIDINE HCL 200 MG PO TABS: 200.0000 mg | ORAL_TABLET | Freq: Three times a day (TID) | ORAL | 0 refills | Status: DC | PRN

## 2018-04-16 MED ORDER — ONDANSETRON HCL 4 MG/2ML IJ SOLN
INTRAMUSCULAR | Status: AC
  Filled 2018-04-16: qty 2

## 2018-04-16 MED ORDER — SODIUM CHLORIDE 0.9 % IR SOLN
Status: DC | PRN
  Administered 2018-04-16: 3000 mL

## 2018-04-16 MED ORDER — KETOROLAC TROMETHAMINE 30 MG/ML IJ SOLN
INTRAMUSCULAR | Status: AC
  Filled 2018-04-16: qty 1

## 2018-04-16 MED ORDER — FENTANYL CITRATE (PF) 100 MCG/2ML IJ SOLN
25.0000 ug | INTRAMUSCULAR | Status: DC | PRN
  Filled 2018-04-16: qty 1

## 2018-04-16 MED ORDER — BELLADONNA ALKALOIDS-OPIUM 16.2-60 MG RE SUPP
RECTAL | Status: DC | PRN
  Administered 2018-04-16: 1 via RECTAL

## 2018-04-16 MED ORDER — MIDAZOLAM HCL 2 MG/2ML IJ SOLN
INTRAMUSCULAR | Status: AC
Start: 2018-04-16 — End: ?
  Filled 2018-04-16: qty 2

## 2018-04-16 MED ORDER — CIPROFLOXACIN HCL 500 MG PO TABS: 500.0000 mg | ORAL_TABLET | Freq: Once | ORAL | 0 refills | Status: AC

## 2018-04-16 MED ORDER — KETOROLAC TROMETHAMINE 30 MG/ML IJ SOLN
INTRAMUSCULAR | Status: DC | PRN
  Administered 2018-04-16: 30 mg via INTRAVENOUS

## 2018-04-16 MED ORDER — CEFAZOLIN SODIUM-DEXTROSE 2-4 GM/100ML-% IV SOLN
2.0000 g | Freq: Once | INTRAVENOUS | Status: AC
  Administered 2018-04-16: 2 g via INTRAVENOUS
  Filled 2018-04-16: qty 100

## 2018-04-16 MED ORDER — DEXAMETHASONE SODIUM PHOSPHATE 10 MG/ML IJ SOLN
INTRAMUSCULAR | Status: DC | PRN
  Administered 2018-04-16: 10 mg via INTRAVENOUS

## 2018-04-16 MED ORDER — PROPOFOL 10 MG/ML IV BOLUS
INTRAVENOUS | Status: DC | PRN
  Administered 2018-04-16: 50 mg via INTRAVENOUS
  Administered 2018-04-16: 200 mg via INTRAVENOUS

## 2018-04-16 MED ORDER — LIDOCAINE 2% (20 MG/ML) 5 ML SYRINGE
INTRAMUSCULAR | Status: DC | PRN
  Administered 2018-04-16: 100 mg via INTRAVENOUS

## 2018-04-16 MED ORDER — IOHEXOL 300 MG/ML  SOLN
INTRAMUSCULAR | Status: DC | PRN
  Administered 2018-04-16: 10 mL

## 2018-04-16 SURGICAL SUPPLY — 33 items
BAG DRAIN URO-CYSTO SKYTR STRL (DRAIN) ×2 IMPLANT
BASKET LASER NITINOL 1.9FR (BASKET) IMPLANT
BASKET STNLS GEMINI 4WIRE 3FR (BASKET) IMPLANT
BASKET STONE 1.7 NGAGE (UROLOGICAL SUPPLIES) ×4 IMPLANT
BASKET ZERO TIP NITINOL 2.4FR (BASKET) IMPLANT
BENZOIN TINCTURE PRP APPL 2/3 (GAUZE/BANDAGES/DRESSINGS) ×2 IMPLANT
CATH URET 5FR 28IN OPEN ENDED (CATHETERS) ×2 IMPLANT
CATH URET DUAL LUMEN 6-10FR 50 (CATHETERS) IMPLANT
CLOTH BEACON ORANGE TIMEOUT ST (SAFETY) ×2 IMPLANT
DRSG TEGADERM 2-3/8X2-3/4 SM (GAUZE/BANDAGES/DRESSINGS) ×2 IMPLANT
EXTRACTOR STONE 1.7FRX115CM (UROLOGICAL SUPPLIES) IMPLANT
FIBER LASER FLEXIVA 365 (UROLOGICAL SUPPLIES) ×2 IMPLANT
FIBER LASER TRAC TIP (UROLOGICAL SUPPLIES) IMPLANT
GLOVE BIO SURGEON STRL SZ7.5 (GLOVE) ×2 IMPLANT
GLOVE SURG SS PI 6.5 STRL IVOR (GLOVE) ×4 IMPLANT
GOWN STRL REUS W/TWL XL LVL3 (GOWN DISPOSABLE) ×2 IMPLANT
GUIDEWIRE 0.038 PTFE COATED (WIRE) IMPLANT
GUIDEWIRE ANG ZIPWIRE 038X150 (WIRE) ×2 IMPLANT
GUIDEWIRE STR DUAL SENSOR (WIRE) ×2 IMPLANT
INFUSOR MANOMETER BAG 3000ML (MISCELLANEOUS) IMPLANT
IV NS IRRIG 3000ML ARTHROMATIC (IV SOLUTION) ×4 IMPLANT
KIT BALLIN UROMAX 15FX10 (LABEL) IMPLANT
KIT BALLN UROMAX 15FX4 (MISCELLANEOUS) IMPLANT
KIT BALLN UROMAX 26 75X4 (MISCELLANEOUS)
KIT TURNOVER CYSTO (KITS) ×2 IMPLANT
MANIFOLD NEPTUNE II (INSTRUMENTS) ×2 IMPLANT
NS IRRIG 500ML POUR BTL (IV SOLUTION) ×2 IMPLANT
PACK CYSTO (CUSTOM PROCEDURE TRAY) ×2 IMPLANT
SET HIGH PRES BAL DIL (LABEL)
SHEATH ACCESS URETERAL 38CM (SHEATH) IMPLANT
STENT URET 6FRX28 CONTOUR (STENTS) ×2 IMPLANT
TUBE CONNECTING 12X1/4 (SUCTIONS) IMPLANT
TUBING UROLOGY SET (TUBING) ×2 IMPLANT

## 2018-04-16 NOTE — Anesthesia Preprocedure Evaluation (Addendum)
Anesthesia Evaluation  Patient identified by MRN, date of birth, ID band Patient awake    Reviewed: Allergy & Precautions, NPO status , Patient's Chart, lab work & pertinent test results  Airway Mallampati: II  TM Distance: >3 FB     Dental   Pulmonary former smoker,    breath sounds clear to auscultation       Cardiovascular hypertension,  Rhythm:Regular Rate:Normal     Neuro/Psych    GI/Hepatic negative GI ROS, Neg liver ROS,   Endo/Other  negative endocrine ROS  Renal/GU negative Renal ROS     Musculoskeletal   Abdominal   Peds  Hematology   Anesthesia Other Findings   Reproductive/Obstetrics                             Anesthesia Physical Anesthesia Plan  ASA: III  Anesthesia Plan: General   Post-op Pain Management:    Induction: Intravenous  PONV Risk Score and Plan: 2 and Treatment may vary due to age or medical condition, Dexamethasone and Midazolam  Airway Management Planned: LMA  Additional Equipment:   Intra-op Plan:   Post-operative Plan: Extubation in OR  Informed Consent: I have reviewed the patients History and Physical, chart, labs and discussed the procedure including the risks, benefits and alternatives for the proposed anesthesia with the patient or authorized representative who has indicated his/her understanding and acceptance.   Dental advisory given  Plan Discussed with: CRNA and Anesthesiologist  Anesthesia Plan Comments:         Anesthesia Quick Evaluation

## 2018-04-16 NOTE — Discharge Instructions (Signed)
DISCHARGE INSTRUCTIONS FOR KIDNEY STONE/URETERAL STENT   MEDICATIONS:  1.  Resume all your other meds from home - except do not take any extra narcotic pain meds that you may have at home.  2. Pyridium is to help with the burning/stinging when you urinate. 3. Tramadol is for moderate/severe pain, otherwise taking upto 1000 mg every 6 hours of plainTylenol will help treat your pain.   4. Take Cipro one hour prior to removal of your stent.   ACTIVITY:  1. No strenuous activity x 1week  2. No driving while on narcotic pain medications  3. Drink plenty of water  4. Continue to walk at home - you can still get blood clots when you are at home, so keep active, but don't over do it.  5. May return to work/school tomorrow or when you feel ready   BATHING:  1. You can shower and we recommend daily showers  2. You have a string coming from your urethra: The stent string is attached to your ureteral stent. Do not pull on this.   SIGNS/SYMPTOMS TO CALL:  Please call us if you have a fever greater than 101.5, uncontrolled nausea/vomiting, uncontrolled pain, dizziness, unable to urinate, bloody urine, chest pain, shortness of breath, leg swelling, leg pain, redness around wound, drainage from wound, or any other concerns or questions.   You can reach [place] at [phone].   FOLLOW-UP:  1. You have an appointment in 6 weeks with a ultrasound of your kidneys prior.   2. You have a string attached to your stent, you may remove it on Monday, June 3rd . To do this, pull the strings until the stents are completely removed. You may feel an odd sensation in your back.  Next dose Advil,Motrin as needed after 7 pm   Post Anesthesia Home Care Instructions  Activity: Get plenty of rest for the remainder of the day. A responsible individual must stay with you for 24 hours following the procedure.  For the next 24 hours, DO NOT: -Drive a car -Paediatric nurse -Drink alcoholic beverages -Take any medication  unless instructed by your physician -Make any legal decisions or sign important papers.  Meals: Start with liquid foods such as gelatin or soup. Progress to regular foods as tolerated. Avoid greasy, spicy, heavy foods. If nausea and/or vomiting occur, drink only clear liquids until the nausea and/or vomiting subsides. Call your physician if vomiting continues.  Special Instructions/Symptoms: Your throat may feel dry or sore from the anesthesia or the breathing tube placed in your throat during surgery. If this causes discomfort, gargle with warm salt water. The discomfort should disappear within 24 hours.  If you had a scopolamine patch placed behind your ear for the management of post- operative nausea and/or vomiting:  1. The medication in the patch is effective for 72 hours, after which it should be removed.  Wrap patch in a tissue and discard in the trash. Wash hands thoroughly with soap and water. 2. You may remove the patch earlier than 72 hours if you experience unpleasant side effects which may include dry mouth, dizziness or visual disturbances. 3. Avoid touching the patch. Wash your hands with soap and water after contact with the patch.

## 2018-04-16 NOTE — Anesthesia Postprocedure Evaluation (Signed)
Anesthesia Post Note  Patient: Sherrick Araki  Procedure(s) Performed: CYSTOSCOPY/RETROGRADE/URETEROSCOPY/STONE EXTRACTION WITH BASKET/ HOLMIUM LASER LITHOTRIPSY/ STENT PLACEMENT (Left Renal) HOLMIUM LASER APPLICATION (Left Renal)     Patient location during evaluation: PACU Anesthesia Type: General Level of consciousness: awake Pain management: pain level controlled Vital Signs Assessment: post-procedure vital signs reviewed and stable Respiratory status: spontaneous breathing Cardiovascular status: stable Anesthetic complications: no    Last Vitals:  Vitals:   04/16/18 1035 04/16/18 1404  BP: (!) 151/81 (!) (P) 141/65  Pulse: 68   Resp: 18   Temp: 36.8 C (P) 36.8 C  SpO2: 99%     Last Pain:  Vitals:   04/16/18 1113  TempSrc:   PainSc: 2                  Pearlie Nies

## 2018-04-16 NOTE — Anesthesia Procedure Notes (Signed)
Procedure Name: LMA Insertion Date/Time: 04/16/2018 12:58 PM Performed by: Bonney Aid, CRNA Pre-anesthesia Checklist: Patient identified, Emergency Drugs available, Suction available and Patient being monitored Patient Re-evaluated:Patient Re-evaluated prior to induction Oxygen Delivery Method: Circle system utilized Preoxygenation: Pre-oxygenation with 100% oxygen Induction Type: IV induction Ventilation: Mask ventilation without difficulty LMA: LMA inserted LMA Size: 5.0 Number of attempts: 1 Airway Equipment and Method: Bite block Placement Confirmation: positive ETCO2 Tube secured with: Tape Dental Injury: Teeth and Oropharynx as per pre-operative assessment

## 2018-04-16 NOTE — Transfer of Care (Signed)
Immediate Anesthesia Transfer of Care Note  Patient: Thomas [name]  Procedure(s) Performed: CYSTOSCOPY/RETROGRADE/URETEROSCOPY/STONE EXTRACTION WITH BASKET/ HOLMIUM LASER LITHOTRIPSY/ STENT PLACEMENT (Left Renal) HOLMIUM LASER APPLICATION (Left Renal)  Patient Location: PACU  Anesthesia Type:General  Level of Consciousness: sedated  Airway & Oxygen Therapy: Patient Spontanous Breathing and Patient connected to nasal cannula oxygen  Post-op Assessment: Report given to RN  Post vital signs: Reviewed and stable  Last Vitals: 141/65 Vitals Value Taken Time  BP    Temp    Pulse 56 04/16/2018  2:05 PM  Resp 18 04/16/2018  2:05 PM  SpO2 97 % 04/16/2018  2:05 PM  Vitals shown include unvalidated device data.  Last Pain:  Vitals:   04/16/18 1113  TempSrc:   PainSc: 2       Patients Stated Pain Goal: 8 (51/02/58 5277)  Complications: No apparent anesthesia complications

## 2018-04-16 NOTE — Op Note (Signed)
Preoperative diagnosis: left ureteral calculus  Postoperative diagnosis: left ureteral calculus  Procedure:  1. Cystoscopy 2. left ureteroscopy and stone removal 3. Ureteroscopic laser lithotripsy 4. left 52F x 26cm  ureteral stent placement  5. left retrograde pyelography with interpretation  Surgeon: Ardis Hughs, MD  Anesthesia: General  Complications: None  Intraoperative findings: The patient stone was crowning at the left ureteral orifice, impacted.  Left retrograde pyelography demonstrated significant hydroureteronephrosis.  There are no additional filling defects.  EBL: Minimal  Specimens: 1. left ureteral calculus  Disposition of specimens: [facility] for stone analysis  Indication: Thomas [name] is a 67 y.o.   patient with a  left ureteral stone and associated left symptoms. After reviewing the management options for treatment, the patient elected to proceed with the above surgical procedure(s). We have discussed the potential benefits and risks of the procedure, side effects of the proposed treatment, the likelihood of the patient achieving the goals of the procedure, and any potential problems that might occur during the procedure or recuperation. Informed consent has been obtained.   Description of procedure:  The patient was taken to the operating room and general anesthesia was induced.  The patient was placed in the dorsal lithotomy position, prepped and draped in the usual sterile fashion, and preoperative antibiotics were administered. A preoperative time-out was performed.   Cystourethroscopy was performed.  The patient's urethra was examined and was norma bilobar prostatic hypertrophy with a median lobe. The bladder was then systematically examined in its entirety. There was no evidence for any bladder tumors, stones, or other mucosal pathology.    Attention then turned to the left ureteral orifice and a ureteral catheter was used to  intubate the ureteral orifice.  Omnipaque contrast was injected through the ureteral catheter and a retrograde pyelogram was performed with findings as dictated above.  Initially, I tried to grasp the stone with the basket but I was unsuccessful, unable to get a purchase on the stone.  Next I tried to laser the stone through the cystoscope but was unable to get the stone lined up correctly with the cystoscope.  Next I used a ureteroscope and was able to ultimately dislodge the stone enough to pass a wire beyond it.  I then advanced the ureteroscope into the ureter and was able to dislodge the stone and pop it out.  However, there was some stone fragments that were impacted.  I then used the laser to fragment all the impacted stones.  These were all then removed.  I then advanced the rigid ureteroscope up to the mid ureter and performed a retrograde pyelogram with the above findings.  I then removed the ureteroscope noting no significant trauma to the ureter.  The wire was then backloaded through the cystoscope and a ureteral stent was advance over the wire using Seldinger technique.  The stent was positioned appropriately under fluoroscopic and cystoscopic guidance.  The wire was then removed with an adequate stent curl noted in the renal pelvis as well as in the bladder.  The bladder was then emptied and the procedure ended.  The patient appeared to tolerate the procedure well and without complications.  The patient was able to be awakened and transferred to the recovery unit in satisfactory condition.   Disposition: The tether of the stent was left on and secured to the ventral aspect of the patient's penis.   Instructions for removing the stent have been provided to the patient. The patient has been scheduled for  followup in 6 weeks with a renal ultrasound.

## 2018-04-16 NOTE — H&P (Signed)
I have ureteral stone.  HPI: Thomas [name] is a 67 year-old male established patient who is here for ureteral stone.  This gentleman is being followed for a left mid to distal ureteral calculi measuring around 3 mm on CT imaging performed earlier this month. He is only had mild to moderate success with control of symptoms utilizing tamsulosin, oxybutynin, ketorolac, and hydromorphone. Presents today complaining of left-sided groin pain radiating into the left lower back. He last took pain medication approximately 2 hours ago. Symptoms also associated with urgency, weak dribbling stream, and nocturia. He denies fevers. He is tolerating oral intake.   The problem is on the left side. He first stated noticing pain on 03/25/2018. This is not his first kidney stone. He is currently having flank pain, back pain, and groin pain. He denies having nausea, vomiting, fever, and chills. Pain is occuring on the left side. He has not caught a stone in his urine strainer since his symptoms began.   He has never had surgical treatment for calculi in the past.     ALLERGIES: Losartan - Other Reaction, cough    MEDICATIONS: Ketorolac Tromethamine 10 mg tablet 1 tablet PO Q 6 H PRN  Oxybutynin Chloride 5 mg tablet 1 tablet PO BID  Tamsulosin Hcl 0.4 mg capsule 1 capsule PO Q PM  Amlodipine Besylate 10 mg tablet 1 tablet PO Daily  B Complex 1 tablet PO Daily  Colcrys 0.6 mg tablet 1 tablet PO Daily PRN  Finacea 15 % foam 1 Topical Daily As Directed  Hydromorphone Hcl 4 mg tablet 1 tablet PO Q 6 H PRN  Magnesium 1 tablet PO Daily  Promethazine Hcl 25 mg tablet 1 tablet PO Q 8 H PRN  Sildenafil 20 mg tablet tablet PO Daily As Directed  Tramadol Hcl 50 mg tablet 1 tablet PO Q 6 H PRN  Zofran 4 mg tablet 1 tablet PO Q 8 H PRN     GU PSH: None   NON-GU PSH: Dental Surgery Procedure    GU PMH: Microscopic hematuria (Worsening, Chronic), Secondary to distal left ureteral stone. - 04/06/2018, (Acute), Secondary to  mid left ureteral calculus. , - 03/29/2018 Ureteral calculus (Improving, Chronic), Left, It appears that stone has progressed to left UVJ. Previously stone noted lower lumbar region on CT. Again discussed proceeding with MET. Stone has progressed to near [employer]. He understands if he has any acute changes to contact office. - 04/06/2018, (Acute), Left, Ketorolac 30 mg IM today. May continue with PRN PO Ketorolac. Will change pain med to Hydromorphone 4 mg 1 po Q6 hrs prn. Tamsulosin 0.4 mg 1 po daily. Given strainer. If stone captured bring to offiice for analysis. Proceed with MET. If he fails MET may need either ESWL vs cystourethroscopy, (L) RPG, stone extraction. F/U in 2 weeks. Instructed to contact office in he has any acute changes ie temp >100.5, intractable pain, or vomiting. , - 03/29/2018 Urinary Frequency (Acute), Secondary to distal left ureteral stone. Oxybutynin 5 mg 1 po BID. - 04/06/2018 Flank Pain (Acute), Left, Secondary to mid 3 mm left ueteral calculus - 03/29/2018 LLQ pain (Acute), Left, Secondary to mid 3 mm left ueteral calculus - 03/29/2018 Ureteral obstruction secondary to calculous (Acute), Left, Secondary to mid 3 mm left ueteral calculus - 03/29/2018 Renal calculus    NON-GU PMH: Nausea (Acute), Secondary to mid 3 mm left ueteral calculus. Promethazine 25 mg 1 po Q6 hrs prn. - 03/29/2018 Diverticulosis Gout Hypertension Squamous cell carcinoma of skin, unspecified  FAMILY HISTORY: 1 Daughter - Daughter 1 son - Son Alcohol abuse - Mother Breast Cancer - Sister Depression - Mother Heart Disease - Mother Hypertension - Mother   SOCIAL HISTORY: Marital Status: Married Preferred Language: English; Ethnicity: Not Hispanic Or Latino; Race: White Current Smoking Status: Patient does not smoke anymore. Has not smoked since 03/17/1998.   Tobacco Use Assessment Completed: Used Tobacco in last 30 days? Does not use smokeless tobacco. Drinks 2 drinks per day. Types of alcohol  consumed: Wine.  Does not use drugs. Drinks 2 caffeinated drinks per day. Has not had a blood transfusion. Patient's occupation Community education officer.    REVIEW OF SYSTEMS:    GU Review Male:   Patient reports frequent urination, hard to postpone urination, get up at night to urinate, and trouble starting your stream. Patient denies burning/ pain with urination, leakage of urine, stream starts and stops, have to strain to urinate , erection problems, and penile pain.  Gastrointestinal (Upper):   Patient denies nausea, vomiting, and indigestion/ heartburn.  Gastrointestinal (Lower):   Patient reports constipation. Patient denies diarrhea.  Constitutional:   Patient denies fever, night sweats, weight loss, and fatigue.  Skin:   Patient denies skin rash/ lesion and itching.  Eyes:   Patient denies blurred vision and double vision.  Ears/ Nose/ Throat:   Patient denies sore throat and sinus problems.  Hematologic/Lymphatic:   Patient denies swollen glands and easy bruising.  Cardiovascular:   Patient denies chest pains and leg swelling.  Respiratory:   Patient denies cough and shortness of breath.  Endocrine:   Patient denies excessive thirst.  Musculoskeletal:   Patient reports back pain. Patient denies joint pain.  Neurological:   Patient denies headaches and dizziness.  Psychologic:   Patient denies depression and anxiety.   Notes: Updated from previous visit 04/06/2018 with review from patient as noted above.   VITAL SIGNS:      04/13/2018 03:43 PM  Weight 200 lb / 90.72 kg  Height 70 in / 177.8 cm  BP 191/70 mmHg  Pulse 80 /min  Temperature 98.5 F / 36.9 C  BMI 28.7 kg/m   MULTI-SYSTEM PHYSICAL EXAMINATION:    Constitutional: Well-nourished. No physical deformities. Normally developed. Good grooming. Appears uncomfortable.  Neck: Neck symmetrical, not swollen. Normal tracheal position.  Respiratory: No labored breathing, no use of accessory muscles. Normal breath sounds.    Cardiovascular: Regular rate and rhythm. No murmur, no gallop. Normal temperature, normal extremity pulses, no swelling, no varicosities.   Skin: No paleness, no jaundice, no cyanosis. No lesion, no ulcer, no rash.   Neurologic / Psychiatric: Oriented to time, oriented to place, oriented to person. No depression, no anxiety, no agitation.   Gastrointestinal: No mass, no tenderness, no rigidity, non obese abdomen. Mild left CVAT  Musculoskeletal: Normal gait and station of head and neck.     PAST DATA REVIEWED:  Source Of History:  Patient  Records Review:   Previous Patient Records  Urine Test Review:   Urinalysis  X-Ray Review: KUB: Reviewed Films. Discussed With Patient.  Renal Ultrasound (Limited): Reviewed Films. Discussed With Patient.  C.T. Stone Protocol: Reviewed Films. Reviewed Report. Discussed With Patient.     04/13/18  Urinalysis  Urine Appearance Cloudy   Urine Color Yellow   Urine Glucose Neg   Urine Bilirubin Neg   Urine Ketones Neg   Urine Specific Gravity 1.020   Urine Blood Trace   Urine pH <=5.0   Urine Protein Trace   Urine  Urobilinogen 0.2   Urine Nitrites Neg   Urine Leukocyte Esterase Trace   Urine WBC/hpf 40 - 60/hpf   Urine RBC/hpf 3 - 10/hpf   Urine Epithelial Cells NS (Not Seen)   Urine Bacteria NS (Not Seen)   Urine Mucous Present   Urine Yeast NS (Not Seen)   Urine Trichomonas Not Present   Urine Cystals NS (Not Seen)   Urine Casts NS (Not Seen)   Urine Sperm Not Present    PROCEDURES:         Renal Ultrasound (Limited) - 76546  Left Kidney: Length: 11.2 cm Depth: 6.1 cm Cortical Width: 1.4 cm Width: 5.7 cm    Left Kidney/Ureter:  Hydronephrosis noted.   Bladder:  PVR = not visualized.       This exam is limited due to bowel gas and rib shadowing.          KUB - [id number]  A single view of the abdomen is obtained. I reviewed with the on-call urologist. The opacity noted slightly superior to the bladder shadow likely does not represent  a calculus in question based on the stone density measured on the original CT scan. An obvious ureteral calculi cannot be diagnosed based on imaging studies today.               Urinalysis w/Scope Dipstick Dipstick Cont'd Micro  Color: Yellow Bilirubin: Neg WBC/hpf: 40 - 60/hpf  Appearance: Cloudy Ketones: Neg RBC/hpf: 3 - 10/hpf  Specific Gravity: 1.020 Blood: Trace Bacteria: NS (Not Seen)  pH: <=5.0 Protein: Trace Cystals: NS (Not Seen)  Glucose: Neg Urobilinogen: 0.2 Casts: NS (Not Seen)    Nitrites: Neg Trichomonas: Not Present    Leukocyte Esterase: Trace Mucous: Present      Epithelial Cells: NS (Not Seen)      Yeast: NS (Not Seen)      Sperm: Not Present    ASSESSMENT:      ICD-10 Details  1 GU:   Ureteral calculus - N20.1 Left  2   Flank Pain - R10.84 Left, Worsening   PLAN:            Medications New Meds: Hydrocodone-Acetaminophen 5 mg-325 mg tablet 1-2 tablet PO Q 6 H PRN   #20  0 Refill(s)            Orders Labs Urine Culture          Schedule Return Visit/Planned Activity: ASAP - Schedule Surgery          Document Letter(s):  Created for Patient: Clinical Summary         Notes:   Patient likely with mid to distal left ureteral calculi that has failed to pass since original diagnosis on CT imaging a couple of weeks ago. Imaging studies today are inconclusive based on my and the on-call urologist review. I'm hesitant to pursue shockwave lithotripsy based on this presentation. Patient continues to be intermittently symptomatic with renal colic as well as irritating lower urinary tract symptoms despite our best efforts at medical management. I discussed further with the on-call urologist who recommended setting the patient up for elective ureteroscopy later this week. A refill of pain medication was given today. ER follow-up for worsening symptoms discussed as well. Urinalysis cultured. He will wait for a telephone call from the scheduler in regards to date/time  of above-mentioned procedure.  Ureteroscopy:The procedure as well as risks and complications were discussed with the patient. These include but are not limited to infection, injury to  the urinary tract i.e. ureteral disruption/evulsion, bleeding, stent discomfort, anesthetic complications, among others.

## 2018-04-19 ENCOUNTER — Encounter (HOSPITAL_BASED_OUTPATIENT_CLINIC_OR_DEPARTMENT_OTHER): Payer: Self-pay | Admitting: Urology

## 2018-05-02 ENCOUNTER — Encounter: Payer: Self-pay | Admitting: Family Medicine

## 2018-05-03 ENCOUNTER — Other Ambulatory Visit: Payer: Self-pay | Admitting: Family Medicine

## 2018-05-03 MED ORDER — TADALAFIL 5 MG PO TABS: 5.0000 mg | ORAL_TABLET | Freq: Every day | ORAL | 2 refills | Status: DC | PRN

## 2018-05-03 NOTE — Telephone Encounter (Signed)
We can send it in but I don't think it is generic yet

## 2018-05-03 NOTE — Telephone Encounter (Signed)
Sent in

## 2018-05-04 ENCOUNTER — Other Ambulatory Visit: Payer: Self-pay

## 2018-05-04 ENCOUNTER — Ambulatory Visit (INDEPENDENT_AMBULATORY_CARE_PROVIDER_SITE_OTHER): Payer: BLUE CROSS/BLUE SHIELD | Admitting: Family Medicine

## 2018-05-04 ENCOUNTER — Encounter: Payer: Self-pay | Admitting: Family Medicine

## 2018-05-04 VITALS — BP 139/67 | HR 68 | Temp 98.5°F | Resp 16 | Ht 70.0 in | Wt 200.0 lb

## 2018-05-04 DIAGNOSIS — I10 Essential (primary) hypertension: Secondary | ICD-10-CM

## 2018-05-04 DIAGNOSIS — M109 Gout, unspecified: Secondary | ICD-10-CM | POA: Diagnosis not present

## 2018-05-04 DIAGNOSIS — S91209A Unspecified open wound of unspecified toe(s) with damage to nail, initial encounter: Secondary | ICD-10-CM | POA: Insufficient documentation

## 2018-05-04 HISTORY — DX: Unspecified open wound of unspecified toe(s) with damage to nail, initial encounter: S91.209A

## 2018-05-04 LAB — COMPREHENSIVE METABOLIC PANEL
ALT: 46 U/L (ref 0–53)
AST: 26 U/L (ref 0–37)
Albumin: 4.6 g/dL (ref 3.5–5.2)
Alkaline Phosphatase: 98 U/L (ref 39–117)
BILIRUBIN TOTAL: 0.7 mg/dL (ref 0.2–1.2)
BUN: 12 mg/dL (ref 6–23)
CALCIUM: 9.5 mg/dL (ref 8.4–10.5)
CHLORIDE: 101 meq/L (ref 96–112)
CO2: 29 meq/L (ref 19–32)
CREATININE: 0.95 mg/dL (ref 0.40–1.50)
GFR: 84.5 mL/min (ref >60.00)
GLUCOSE: 96 mg/dL (ref 70–99)
Potassium: 3.9 mEq/L (ref 3.5–5.1)
Sodium: 138 mEq/L (ref 135–145)
Total Protein: 7.1 g/dL (ref 6.0–8.3)

## 2018-05-04 LAB — LIPID PANEL
CHOL/HDL RATIO: 5
Cholesterol: 209 mg/dL — ABNORMAL HIGH (ref 0–200)
HDL: 42.8 mg/dL (ref >39.00)
LDL CALC: 139 mg/dL — AB (ref 0–99)
NONHDL: 166.06
TRIGLYCERIDES: 135 mg/dL (ref 0.0–149.0)
VLDL: 27 mg/dL (ref 0.0–40.0)

## 2018-05-04 LAB — URIC ACID: Uric Acid, Serum: 7.4 mg/dL (ref 4.0–7.8)

## 2018-05-04 MED ORDER — COLCHICINE 0.6 MG PO TABS: 0.6000 mg | ORAL_TABLET | Freq: Every day | ORAL | 3 refills | Status: DC

## 2018-05-04 MED ORDER — COLCHICINE 0.6 MG PO CAPS: 1.0000 | ORAL_CAPSULE | Freq: Every day | ORAL | 3 refills | Status: DC

## 2018-05-04 MED ORDER — TELMISARTAN 20 MG PO TABS: 20.0000 mg | ORAL_TABLET | Freq: Every day | ORAL | 2 refills | Status: DC

## 2018-05-04 NOTE — Assessment & Plan Note (Signed)
B/l feet affected now Check uric acid Refill colchicine Lab Results  Component Value Date   LABURIC 7.5 07/21/2017   He will most likely need allopurinol -- will wait on repeat labs

## 2018-05-04 NOTE — Patient Instructions (Signed)

## 2018-05-04 NOTE — Progress Notes (Signed)
Patient ID: Thomas [name], male   DOB: 09-19-1951, 67 y.o.   MRN: [id number]     Subjective:  I acted as a Education administrator for Dr. Carollee Herter.  Guerry Bruin, [place]   Patient ID: Thomas [name], male    DOB: 1951/07/07, 67 y.o.   MRN: [id number]  Chief Complaint  Patient presents with  . Toe Injury    right great toe  . Hypertension  . Gout     HPI  Patient is in today for right great toe pain and follow up on blood pressure and gout. Pt stubbed R big toe and nail pulled back.  He does not want nail removed.  It has not been cleaned Pt also c/o reoccurring gout -- both feel swelling -- colchicine helps.    Patient Care Team: Carollee Herter, Alferd Apa, DO as PCP - General (Family Medicine)   Past Medical History:  Diagnosis Date  . Chronic gout    multiple sites--- followed by pcp--- (04-14-2018  per pt last flare-up , march 2019)  . ED (erectile dysfunction)   . History of kidney stones   . History of squamous cell carcinoma excision   . Hyperlipidemia   . Hypertension   . Left ureteral calculus   . Urgency of urination   . Wears glasses     Past Surgical History:  Procedure Laterality Date  . COLONOSCOPY WITH PROPOFOL  2017  . CYSTOSCOPY/RETROGRADE/URETEROSCOPY/STONE EXTRACTION WITH BASKET Left 04/16/2018   Procedure: CYSTOSCOPY/RETROGRADE/URETEROSCOPY/STONE EXTRACTION WITH BASKET/ HOLMIUM LASER LITHOTRIPSY/ STENT PLACEMENT;  Surgeon: Ardis Hughs, MD;  Location: [facility];  Service: Urology;  Laterality: Left;  . HOLMIUM LASER APPLICATION Left 7/62/8315   Procedure: HOLMIUM LASER APPLICATION;  Surgeon: Ardis Hughs, MD;  Location: [facility];  Service: Urology;  Laterality: Left;    Family History  Problem Relation Age of Onset  . Heart disease Mother 43  . Alcohol abuse Mother   . Depression Mother   . Cancer Sister        hodgkins, breast  . Hypertension Father   . Colon cancer Neg Hx   . Pancreatic cancer Neg Hx   . Rectal cancer  Neg Hx   . Stomach cancer Neg Hx     Social History   Socioeconomic History  . Marital status: Married    Spouse name: Not on file  . Number of children: Not on file  . Years of education: Not on file  . Highest education level: Not on file  Occupational History  . Occupation: Pharmacist, hospital  Social Needs  . Financial resource strain: Not on file  . Food insecurity:    Worry: Not on file    Inability: Not on file  . Transportation needs:    Medical: Not on file    Non-medical: Not on file  Tobacco Use  . Smoking status: Former Smoker    Packs/day: 0.30    Years: 20.00    Pack years: 6.00    Types: Cigarettes    Last attempt to quit: 10/17/1994    Years since quitting: 23.5  . Smokeless tobacco: Never Used  Substance and Sexual Activity  . Alcohol use: Yes    Alcohol/week: 1.2 oz    Types: 2 Glasses of wine per week  . Drug use: No  . Sexual activity: Yes  Lifestyle  . Physical activity:    Days per week: Not on file    Minutes per session: Not on file  . Stress: Not on  file  Relationships  . Social connections:    Talks on phone: Not on file    Gets together: Not on file    Attends religious service: Not on file    Active member of club or organization: Not on file    Attends meetings of clubs or organizations: Not on file    Relationship status: Not on file  . Intimate partner violence:    Fear of current or ex partner: Not on file    Emotionally abused: Not on file    Physically abused: Not on file    Forced sexual activity: Not on file  Other Topics Concern  . Not on file  Social History Narrative   Exercise 1 hour a day ---3-4 days a week    Outpatient Medications Prior to Visit  Medication Sig Dispense Refill  . amLODipine (NORVASC) 10 MG tablet Take 1 tablet (10 mg total) by mouth daily. (Patient taking differently: Take 10 mg by mouth every morning. ) 90 tablet 3  . b complex vitamins tablet Take 1 tablet by mouth daily.    Marland Kitchen FINACEA 15 % cream Use  topical as directed (Patient taking differently: Apply topically daily as needed. Use topical as directed) 30 g 0  . MAGNESIUM PO Take 1 tablet by mouth daily.    . tadalafil (CIALIS) 5 MG tablet Take 1 tablet (5 mg total) by mouth daily as needed for erectile dysfunction. 30 tablet 2  . colchicine (COLCRYS) 0.6 MG tablet Take 1 tablet (0.6 mg total) by mouth daily. (Patient taking differently: Take 0.6 mg by mouth 2 (two) times daily as needed. ) 90 tablet 3  . fluticasone (FLONASE) 50 MCG/ACT nasal spray Place 2 sprays into both nostrils daily. (Patient taking differently: Place 2 sprays into both nostrils daily as needed. ) 16 g 1  . ondansetron (ZOFRAN ODT) 4 MG disintegrating tablet Take 1 tablet (4 mg total) by mouth every 8 (eight) hours as needed for nausea or vomiting. 20 tablet 0  . oxybutynin (DITROPAN) 5 MG tablet Take 5 mg by mouth every 8 (eight) hours as needed for bladder spasms.    . phenazopyridine (PYRIDIUM) 200 MG tablet Take 1 tablet (200 mg total) by mouth 3 (three) times daily as needed for pain. 10 tablet 0  . Polyethylene Glycol 3350 (MIRALAX PO) Take by mouth.    . sildenafil (VIAGRA) 100 MG tablet Take 0.5-1 tablets (50-100 mg total) by mouth daily as needed for erectile dysfunction. 10 tablet 2  . traMADol (ULTRAM) 50 MG tablet Take 1-2 tablets (50-100 mg total) by mouth every 6 (six) hours as needed for moderate pain. 15 tablet 0   No facility-administered medications prior to visit.     No Active Allergies  Review of Systems  Constitutional: Negative for fever and malaise/fatigue.  HENT: Negative for congestion.   Eyes: Negative for blurred vision.  Respiratory: Negative for cough and shortness of breath.   Cardiovascular: Negative for chest pain, palpitations and leg swelling.  Gastrointestinal: Negative for vomiting.  Musculoskeletal: Negative for back pain.       Right great toe pain   Skin: Negative for itching and rash.  Neurological: Negative for loss  of consciousness and headaches.       Objective:    Physical Exam  Constitutional: He is oriented to person, place, and time. Vital signs are normal. He appears well-developed and well-nourished. He is sleeping.  HENT:  Head: Normocephalic and atraumatic.  Mouth/Throat: Oropharynx is clear  and moist.  Eyes: Pupils are equal, round, and reactive to light. EOM are normal.  Neck: Normal range of motion. Neck supple. No thyromegaly present.  Cardiovascular: Normal rate and regular rhythm.  No murmur heard. Pulmonary/Chest: Effort normal and breath sounds normal. No respiratory distress. He has no wheezes. He has no rales. He exhibits no tenderness.  Musculoskeletal: He exhibits no edema or tenderness.  Both feet --  Bottom of feet chronically swollen and painful   Neurological: He is alert and oriented to person, place, and time.  Skin: Skin is warm and dry.  r big toe--  Nail partially avulsed Pt prefers not to pull it off-- prefers to let it grow out   Psychiatric: He has a normal mood and affect. His behavior is normal. Judgment and thought content normal.  Nursing note and vitals reviewed.   BP 139/67   Pulse 68   Temp 98.5 F (36.9 C) (Oral)   Resp 16   Ht 5\' 10"  (1.778 m)   Wt 200 lb (90.7 kg)   SpO2 100%   BMI 28.70 kg/m  Wt Readings from Last 3 Encounters:  05/04/18 200 lb (90.7 kg)  04/16/18 198 lb 9.6 oz (90.1 kg)  03/27/18 210 lb (95.3 kg)   BP Readings from Last 3 Encounters:  05/04/18 139/67  04/16/18 (!) 155/77  03/27/18 (!) 177/90     Immunization History  Administered Date(s) Administered  . Influenza,inj,Quad PF,6+ Mos 07/21/2017  . Influenza-Unspecified 07/27/2015, 08/17/2016  . Tdap 06/06/2013, 05/09/2014  . Zoster Recombinat (Shingrix) 10/16/2017    Health Maintenance  Topic Date Due  . HIV Screening  02/05/1968  . PNA vac Low Risk Adult (1 of 2 - PCV13) 02/04/2018  . INFLUENZA VACCINE  06/17/2018  . TETANUS/TDAP  05/09/2024  .  COLONOSCOPY  10/30/2024  . Hepatitis C Screening  Completed    Lab Results  Component Value Date   WBC 11.6 (H) 03/27/2018   HGB 15.9 03/27/2018   HCT 48.1 03/27/2018   PLT 257 03/27/2018   GLUCOSE 172 (H) 03/27/2018   CHOL 218 (H) 07/21/2017   TRIG 194.0 (H) 07/21/2017   HDL 40.10 07/21/2017   LDLCALC 139 (H) 07/21/2017   ALT 40 03/27/2018   AST 26 03/27/2018   NA 136 03/27/2018   K 3.7 03/27/2018   CL 100 (L) 03/27/2018   CREATININE 1.05 03/27/2018   BUN 16 03/27/2018   CO2 24 03/27/2018   TSH 1.85 11/14/2016    Lab Results  Component Value Date   TSH 1.85 11/14/2016   Lab Results  Component Value Date   WBC 11.6 (H) 03/27/2018   HGB 15.9 03/27/2018   HCT 48.1 03/27/2018   MCV 87.1 03/27/2018   PLT 257 03/27/2018   Lab Results  Component Value Date   NA 136 03/27/2018   K 3.7 03/27/2018   CO2 24 03/27/2018   GLUCOSE 172 (H) 03/27/2018   BUN 16 03/27/2018   CREATININE 1.05 03/27/2018   BILITOT 0.6 03/27/2018   ALKPHOS 88 03/27/2018   AST 26 03/27/2018   ALT 40 03/27/2018   PROT 7.8 03/27/2018   ALBUMIN 4.5 03/27/2018   CALCIUM 9.1 03/27/2018   ANIONGAP 12 03/27/2018   GFR 78.93 07/21/2017   Lab Results  Component Value Date   CHOL 218 (H) 07/21/2017   Lab Results  Component Value Date   HDL 40.10 07/21/2017   Lab Results  Component Value Date   LDLCALC 139 (H) 07/21/2017   Lab Results  Component Value Date   TRIG 194.0 (H) 07/21/2017   Lab Results  Component Value Date   CHOLHDL 5 07/21/2017   No results found for: HGBA1C       Assessment & Plan:   Problem List Items Addressed This Visit      Unprioritized   Avulsed toenail, initial encounter    Keep clean -- h2O2 / water soaks  Keep wrapped  Pt did not want nail removed  rto if any signs of infection       Essential hypertension    con't norvasc  Add micardis rto 1 month or sooner prn       Relevant Medications   telmisartan (MICARDIS) 20 MG tablet   Other Relevant  Orders   Lipid panel   Comprehensive metabolic panel   Gout   Relevant Medications   colchicine (COLCRYS) 0.6 MG tablet   Gout of multiple sites    B/l feet affected now Check uric acid Refill colchicine Lab Results  Component Value Date   LABURIC 7.5 07/21/2017   He will most likely need allopurinol -- will wait on repeat labs       Other Visit Diagnoses    Gout of both feet    -  Primary   Relevant Medications   colchicine (COLCRYS) 0.6 MG tablet   Other Relevant Orders   Uric acid      I have discontinued Nemiah Micco's sildenafil, fluticasone, ondansetron, oxybutynin, Polyethylene Glycol 3350 (MIRALAX PO), traMADol, and phenazopyridine. I am also having him start on telmisartan. Additionally, I am having him maintain his b complex vitamins, MAGNESIUM PO, FINACEA, amLODipine, tadalafil, and colchicine.  Meds ordered this encounter  Medications  . colchicine (COLCRYS) 0.6 MG tablet    Sig: Take 1 tablet (0.6 mg total) by mouth daily.    Dispense:  30 tablet    Refill:  3  . telmisartan (MICARDIS) 20 MG tablet    Sig: Take 1 tablet (20 mg total) by mouth daily.    Dispense:  30 tablet    Refill:  2    CMA served as scribe during this visit. History, Physical and Plan performed by medical provider. Documentation and orders reviewed and attested to.  Ann Held, DO

## 2018-05-04 NOTE — Assessment & Plan Note (Signed)
con't norvasc  Add micardis rto 1 month or sooner prn

## 2018-05-04 NOTE — Assessment & Plan Note (Signed)
Keep clean -- h2O2 / water soaks  Keep wrapped  Pt did not want nail removed  rto if any signs of infection

## 2018-05-10 ENCOUNTER — Ambulatory Visit: Payer: BLUE CROSS/BLUE SHIELD | Admitting: Family Medicine

## 2018-05-12 MED ORDER — ALLOPURINOL 100 MG PO TABS: 100.0000 mg | ORAL_TABLET | Freq: Every day | ORAL | 1 refills | Status: DC

## 2018-05-12 NOTE — Addendum Note (Signed)
Addended by: Bartholome Bill on: 05/12/2018 11:19 AM   Modules accepted: Orders

## 2018-06-04 ENCOUNTER — Ambulatory Visit (INDEPENDENT_AMBULATORY_CARE_PROVIDER_SITE_OTHER): Payer: BLUE CROSS/BLUE SHIELD | Admitting: Family Medicine

## 2018-06-04 DIAGNOSIS — I10 Essential (primary) hypertension: Secondary | ICD-10-CM | POA: Diagnosis not present

## 2018-06-04 NOTE — Progress Notes (Signed)
Pre visit review using our clinic review tool, if applicable. No additional management support is needed unless otherwise documented below in the visit note.  Pt here for Blood pressure check per pcp Dr. Carollee Herter  Pt currently takes: Amlodipine 10 mg tablet daily and added telmisartan 20 mg at last office visit last month.    Pt reports compliance with medication.  BP today @ 9:32 =158/83 HR =70  BP Readings from Last 3 Encounters:  05/04/18 139/67  04/16/18 (!) 155/77  03/27/18 (!) 177/90   After resting for 10 minutes patients blood pressure was then  BP=137/79 HR=76   Pt advised per Dr. Carollee Herter, increase to 40 mg telmisartan, continue Amlodipine dose as is and follow up with pcp in 3 months. Patient declined scheduling follow up visit today for 3 months. States he will call.

## 2018-06-04 NOTE — Progress Notes (Signed)
Reviewed  Yvonne R Lowne Chase, DO  

## 2018-07-05 ENCOUNTER — Encounter: Payer: Self-pay | Admitting: Family Medicine

## 2018-07-05 ENCOUNTER — Other Ambulatory Visit: Payer: Self-pay | Admitting: Family Medicine

## 2018-07-05 DIAGNOSIS — I10 Essential (primary) hypertension: Secondary | ICD-10-CM

## 2018-07-05 MED ORDER — TELMISARTAN 40 MG PO TABS: 40.0000 mg | ORAL_TABLET | Freq: Every day | ORAL | 1 refills | Status: DC

## 2018-07-05 NOTE — Telephone Encounter (Signed)
Sent in

## 2018-07-05 NOTE — Telephone Encounter (Signed)
Not sure why the medication was taken of list.  Do you want him on this or maybe he should ask cardiology?

## 2018-07-16 ENCOUNTER — Encounter: Payer: Self-pay | Admitting: Family Medicine

## 2018-07-16 ENCOUNTER — Ambulatory Visit (INDEPENDENT_AMBULATORY_CARE_PROVIDER_SITE_OTHER): Payer: BLUE CROSS/BLUE SHIELD | Admitting: Family Medicine

## 2018-07-16 VITALS — BP 157/63 | HR 64 | Temp 98.5°F | Resp 16 | Ht 70.0 in | Wt 209.2 lb

## 2018-07-16 DIAGNOSIS — Z23 Encounter for immunization: Secondary | ICD-10-CM

## 2018-07-16 DIAGNOSIS — M1009 Idiopathic gout, multiple sites: Secondary | ICD-10-CM | POA: Diagnosis not present

## 2018-07-16 DIAGNOSIS — M2550 Pain in unspecified joint: Secondary | ICD-10-CM | POA: Insufficient documentation

## 2018-07-16 HISTORY — DX: Pain in unspecified joint: M25.50

## 2018-07-16 NOTE — Patient Instructions (Addendum)
Joint Pain Joint pain, which is also called arthralgia, can be caused by many things. Joint pain often goes away when you follow your health care provider's instructions for relieving pain at home. However, joint pain can also be caused by conditions that require further treatment. Common causes of joint pain include:  Bruising in the area of the joint.  Overuse of the joint.  Wear and tear on the joints that occur with aging (osteoarthritis).  Various other forms of arthritis.  A buildup of a crystal form of uric acid in the joint (gout).  Infections of the joint (septic arthritis) or of the bone (osteomyelitis).  Your health care provider may recommend medicine to help with the pain. If your joint pain continues, additional tests may be needed to diagnose your condition. Follow these instructions at home: Watch your condition for any changes. Follow these instructions as directed to lessen the pain that you are feeling.  Take medicines only as directed by your health care provider.  Rest the affected area for as long as your health care provider says that you should. If directed to do so, raise the painful joint above the level of your heart while you are sitting or lying down.  Do not do things that cause or worsen pain.  If directed, apply ice to the painful area:,  ? Put ice in a plastic bag. ? Place a towel between your skin and the bag. ? Leave the ice on for 20 minutes, 2-3 times per day.  Wear an elastic bandage, splint, or sling as directed by your health care provider. Loosen the elastic bandage or splint if your fingers or toes become numb and tingle, or if they turn cold and blue.  Begin exercising or stretching the affected area as directed by your health care provider. Ask your health care provider what types of exercise are safe for you.  Keep all follow-up visits as directed by your health care provider. This is important.  Contact a health care provider  if:  Your pain increases, and medicine does not help.  Your joint pain does not improve within 3 days.  You have increased bruising or swelling.  You have a fever.  You lose 10 lb (4.5 kg) or more without trying. Get help right away if:  You are not able to move the joint.  Your fingers or toes become numb or they turn cold and blue. This information is not intended to replace advice given to you by your health care provider. Make sure you discuss any questions you have with your health care provider. Document Released: 11/03/2005 Document Revised: 04/04/2016 Document Reviewed: 08/15/2014 Elsevier Interactive Patient Education  [employer].

## 2018-07-16 NOTE — Assessment & Plan Note (Signed)
D/c allopurinol  If symptoms return will get labs --- consider rheum

## 2018-07-16 NOTE — Assessment & Plan Note (Signed)
Stop allopurinol due to joint pains Will recheck blood work in a few months at pt request

## 2018-07-16 NOTE — Progress Notes (Signed)
Patient ID: Thomas [name], male    DOB: 08-09-1951  Age: 67 y.o. MRN: [id number]    Subjective:  Subjective  HPI Thomas [name] presents for mult joint pain -- knees , ankles  Elbows, hands --- he stopped the allopurinol for a few days and it went away.  He wants to know what to do.  He also c/o the area on his L arm where the mole was removed-- he does not like the way it looks  Review of Systems  Constitutional: Negative for chills and fever.  HENT: Negative for congestion and hearing loss.   Eyes: Negative for discharge.  Respiratory: Negative for cough and shortness of breath.   Cardiovascular: Negative for chest pain, palpitations and leg swelling.  Gastrointestinal: Negative for abdominal pain, blood in stool, constipation, diarrhea, nausea and vomiting.  Genitourinary: Negative for dysuria, frequency, hematuria and urgency.  Musculoskeletal: Positive for arthralgias. Negative for back pain and myalgias.  Skin: Negative for rash.  Allergic/Immunologic: Negative for environmental allergies.  Neurological: Negative for dizziness, weakness and headaches.  Hematological: Does not bruise/bleed easily.  Psychiatric/Behavioral: Negative for suicidal ideas. The patient is not nervous/anxious.     History Past Medical History:  Diagnosis Date  . Chronic gout    multiple sites--- followed by pcp--- (04-14-2018  per pt last flare-up , march 2019)  . ED (erectile dysfunction)   . History of kidney stones   . History of squamous cell carcinoma excision   . Hyperlipidemia   . Hypertension   . Left ureteral calculus   . Urgency of urination   . Wears glasses     He has a past surgical history that includes Colonoscopy with propofol (2017); Cystoscopy/retrograde/ureteroscopy/stone extraction with basket (Left, 04/16/2018); and Holmium laser application (Left, 03/25/3266).   His family history includes Alcohol abuse in his mother; Cancer in his sister; Depression in his mother; Heart disease  (age of onset: 29) in his mother; Hypertension in his father.He reports that he quit smoking about 23 years ago. His smoking use included cigarettes. He has a 6.00 pack-year smoking history. He has never used smokeless tobacco. He reports that he drinks about 2.0 standard drinks of alcohol per week. He reports that he does not use drugs.  Current Outpatient Medications on File Prior to Visit  Medication Sig Dispense Refill  . amLODipine (NORVASC) 10 MG tablet Take 1 tablet (10 mg total) by mouth daily. (Patient taking differently: Take 10 mg by mouth every morning. ) 90 tablet 3  . b complex vitamins tablet Take 1 tablet by mouth daily.    . Colchicine (MITIGARE) 0.6 MG CAPS Take 1 tablet by mouth daily. 30 capsule 3  . FINACEA 15 % cream Use topical as directed (Patient taking differently: Apply topically daily as needed. Use topical as directed) 30 g 0  . MAGNESIUM PO Take 1 tablet by mouth daily.    . tadalafil (CIALIS) 5 MG tablet Take 1 tablet (5 mg total) by mouth daily as needed for erectile dysfunction. 30 tablet 2  . telmisartan (MICARDIS) 40 MG tablet Take 1 tablet (40 mg total) by mouth daily. 90 tablet 1   No current facility-administered medications on file prior to visit.      Objective:  Objective  Physical Exam  Constitutional: He is oriented to person, place, and time. Vital signs are normal. He appears well-developed and well-nourished. He is sleeping.  HENT:  Head: Normocephalic and atraumatic.  Mouth/Throat: Oropharynx is clear and moist.  Eyes: Pupils are  equal, round, and reactive to light. EOM are normal.  Neck: Normal range of motion. Neck supple. No thyromegaly present.  Cardiovascular: Normal rate and regular rhythm.  No murmur heard. Pulmonary/Chest: Effort normal and breath sounds normal. No respiratory distress. He has no wheezes. He has no rales. He exhibits no tenderness.  Musculoskeletal: He exhibits no edema or tenderness.  Neurological: He is alert and  oriented to person, place, and time.  Skin: Skin is warm and dry.     Psychiatric: He has a normal mood and affect. His behavior is normal. Judgment and thought content normal.  Nursing note and vitals reviewed.  BP (!) 157/63 (BP Location: Left Arm, Cuff Size: Normal)   Pulse 64   Temp 98.5 F (36.9 C) (Oral)   Resp 16   Ht 5\' 10"  (1.778 m)   Wt 209 lb 3.2 oz (94.9 kg)   SpO2 100%   BMI 30.02 kg/m  Wt Readings from Last 3 Encounters:  07/16/18 209 lb 3.2 oz (94.9 kg)  05/04/18 200 lb (90.7 kg)  04/16/18 198 lb 9.6 oz (90.1 kg)     Lab Results  Component Value Date   WBC 11.6 (H) 03/27/2018   HGB 15.9 03/27/2018   HCT 48.1 03/27/2018   PLT 257 03/27/2018   GLUCOSE 96 05/04/2018   CHOL 209 (H) 05/04/2018   TRIG 135.0 05/04/2018   HDL 42.80 05/04/2018   LDLCALC 139 (H) 05/04/2018   ALT 46 05/04/2018   AST 26 05/04/2018   NA 138 05/04/2018   K 3.9 05/04/2018   CL 101 05/04/2018   CREATININE 0.95 05/04/2018   BUN 12 05/04/2018   CO2 29 05/04/2018   TSH 1.85 11/14/2016    No results found.   Assessment & Plan:  Plan  I have discontinued Thomas [name]'s allopurinol. I am also having him maintain his b complex vitamins, MAGNESIUM PO, FINACEA, amLODipine, tadalafil, Colchicine, and telmisartan.  No orders of the defined types were placed in this encounter.   Problem List Items Addressed This Visit      Unprioritized   Gout    Stop allopurinol due to joint pains Will recheck blood work in a few months at pt request      Multiple joint pain - Primary    D/c allopurinol  If symptoms return will get labs --- consider rheum         Follow-up: No follow-ups on file.  Ann Held, DO

## 2018-07-28 ENCOUNTER — Encounter: Payer: Self-pay | Admitting: Family Medicine

## 2018-07-30 ENCOUNTER — Other Ambulatory Visit: Payer: Self-pay | Admitting: Family Medicine

## 2018-07-30 MED ORDER — TRAMADOL HCL 50 MG PO TABS: 50.0000 mg | ORAL_TABLET | Freq: Three times a day (TID) | ORAL | 0 refills | Status: DC | PRN

## 2018-07-30 NOTE — Telephone Encounter (Signed)
I refilled tramadol  Refer to rheum for joint pains I do not think abx would help

## 2018-07-30 NOTE — Telephone Encounter (Signed)
Database ran and is on your desk for review.  Last filled per database: 04/16/18 Last written: 04/16/18 Last ov: 07/16/18 Next ov: 08/02/18 Contract: can get at 08/02/18 visit UDS: can get at 08/02/18 visit

## 2018-07-31 ENCOUNTER — Other Ambulatory Visit: Payer: Self-pay | Admitting: Family Medicine

## 2018-08-02 ENCOUNTER — Ambulatory Visit (INDEPENDENT_AMBULATORY_CARE_PROVIDER_SITE_OTHER): Payer: BLUE CROSS/BLUE SHIELD | Admitting: Family Medicine

## 2018-08-02 ENCOUNTER — Encounter: Payer: Self-pay | Admitting: Family Medicine

## 2018-08-02 VITALS — BP 167/73 | HR 64 | Temp 98.4°F | Resp 16 | Ht 70.0 in | Wt 210.6 lb

## 2018-08-02 DIAGNOSIS — R739 Hyperglycemia, unspecified: Secondary | ICD-10-CM

## 2018-08-02 DIAGNOSIS — M2550 Pain in unspecified joint: Secondary | ICD-10-CM

## 2018-08-02 NOTE — Progress Notes (Signed)
Patient ID: Thomas [name], male    DOB: [birth year]  Age: 67 y.o. MRN: [id number]    Subjective:  Subjective  HPI Valgene Deloatch presents for f/u joint pains.  No better  Taking tramadol at night but it keeps him awake.   Pt is not worse but no better.   Advil/ motrin helps   Review of Systems  Constitutional: Negative for fever.  HENT: Negative for congestion.   Respiratory: Negative for shortness of breath.   Cardiovascular: Negative for chest pain, palpitations and leg swelling.  Gastrointestinal: Negative for abdominal pain, blood in stool and nausea.  Genitourinary: Negative for dysuria and frequency.  Musculoskeletal: Positive for arthralgias, joint swelling and myalgias.  Skin: Negative for rash.  Allergic/Immunologic: Negative for environmental allergies.  Neurological: Negative for dizziness and headaches.  Psychiatric/Behavioral: The patient is not nervous/anxious.     History Past Medical History:  Diagnosis Date  . Chronic gout    multiple sites--- followed by pcp--- (04-14-2018  per pt last flare-up , march 2019)  . ED (erectile dysfunction)   . History of kidney stones   . History of squamous cell carcinoma excision   . Hyperlipidemia   . Hypertension   . Left ureteral calculus   . Urgency of urination   . Wears glasses     He has a past surgical history that includes Colonoscopy with propofol (2017); Cystoscopy/retrograde/ureteroscopy/stone extraction with basket (Left, 04/16/2018); and Holmium laser application (Left, 02/23/8118).   His family history includes Alcohol abuse in his mother; Cancer in his sister; Depression in his mother; Heart disease (age of onset: 75) in his mother; Hypertension in his father.He reports that he quit smoking about 23 years ago. His smoking use included cigarettes. He has a 6.00 pack-year smoking history. He has never used smokeless tobacco. He reports that he drinks about 2.0 standard drinks of alcohol per week. He reports that he does  not use drugs.  Current Outpatient Medications on File Prior to Visit  Medication Sig Dispense Refill  . amLODipine (NORVASC) 10 MG tablet Take 1 tablet (10 mg total) by mouth daily. (Patient taking differently: Take 10 mg by mouth every morning. ) 90 tablet 3  . b complex vitamins tablet Take 1 tablet by mouth daily.    . Colchicine (MITIGARE) 0.6 MG CAPS Take 1 tablet by mouth daily. 30 capsule 3  . FINACEA 15 % cream Use topical as directed (Patient taking differently: Apply topically daily as needed. Use topical as directed) 30 g 0  . MAGNESIUM PO Take 1 tablet by mouth daily.    . tadalafil (CIALIS) 5 MG tablet Take 1 tablet (5 mg total) by mouth daily as needed for erectile dysfunction. 30 tablet 2  . telmisartan (MICARDIS) 40 MG tablet Take 1 tablet (40 mg total) by mouth daily. 90 tablet 1  . traMADol (ULTRAM) 50 MG tablet Take 1 tablet (50 mg total) by mouth every 8 (eight) hours as needed. 30 tablet 0   No current facility-administered medications on file prior to visit.      Objective:  Objective  Physical Exam  Constitutional: He is oriented to person, place, and time. Vital signs are normal. He appears well-developed and well-nourished. He is sleeping.  HENT:  Head: Normocephalic and atraumatic.  Mouth/Throat: Oropharynx is clear and moist.  Eyes: Pupils are equal, round, and reactive to light. EOM are normal.  Neck: Normal range of motion. Neck supple. No thyromegaly present.  Cardiovascular: Normal rate and regular rhythm.  No  murmur heard. Pulmonary/Chest: Effort normal and breath sounds normal. No respiratory distress. He has no wheezes. He has no rales. He exhibits no tenderness.  Musculoskeletal: He exhibits no edema or tenderness.  Neurological: He is alert and oriented to person, place, and time.  Skin: Skin is warm and dry.  Psychiatric: He has a normal mood and affect. His behavior is normal. Judgment and thought content normal.  Nursing note and vitals  reviewed.  BP (!) 167/73   Pulse 64   Temp 98.4 F (36.9 C) (Oral)   Resp 16   Ht 5\' 10"  (1.778 m)   Wt 210 lb 9.6 oz (95.5 kg)   SpO2 100%   BMI 30.22 kg/m  Wt Readings from Last 3 Encounters:  08/02/18 210 lb 9.6 oz (95.5 kg)  07/16/18 209 lb 3.2 oz (94.9 kg)  05/04/18 200 lb (90.7 kg)     Lab Results  Component Value Date   WBC 11.6 (H) 03/27/2018   HGB 15.9 03/27/2018   HCT 48.1 03/27/2018   PLT 257 03/27/2018   GLUCOSE 96 05/04/2018   CHOL 209 (H) 05/04/2018   TRIG 135.0 05/04/2018   HDL 42.80 05/04/2018   LDLCALC 139 (H) 05/04/2018   ALT 46 05/04/2018   AST 26 05/04/2018   NA 138 05/04/2018   K 3.9 05/04/2018   CL 101 05/04/2018   CREATININE 0.95 05/04/2018   BUN 12 05/04/2018   CO2 29 05/04/2018   TSH 1.85 11/14/2016    No results found.   Assessment & Plan:  Plan  I have discontinued Orvel Tucholski's allopurinol. I am also having him maintain his b complex vitamins, MAGNESIUM PO, FINACEA, amLODipine, tadalafil, Colchicine, telmisartan, and traMADol.  No orders of the defined types were placed in this encounter.   Problem List Items Addressed This Visit      Unprioritized   Multiple joint pain - Primary   Relevant Orders   CBC with Differential/Platelet   Antinuclear Antib (ANA)   Rheumatoid Factor    Other Visit Diagnoses    Hyperglycemia       Relevant Orders   Hemoglobin A1c    pt would like to see a chiropractor We will check labs  Consider pred taper / rheum referral   Follow-up: Return if symptoms worsen or fail to improve.  Ann Held, DO

## 2018-08-02 NOTE — Patient Instructions (Signed)
Joint Pain Joint pain, which is also called arthralgia, can be caused by many things. Joint pain often goes away when you follow your health care provider's instructions for relieving pain at home. However, joint pain can also be caused by conditions that require further treatment. Common causes of joint pain include:  Bruising in the area of the joint.  Overuse of the joint.  Wear and tear on the joints that occur with aging (osteoarthritis).  Various other forms of arthritis.  A buildup of a crystal form of uric acid in the joint (gout).  Infections of the joint (septic arthritis) or of the bone (osteomyelitis).  Your health care provider may recommend medicine to help with the pain. If your joint pain continues, additional tests may be needed to diagnose your condition. Follow these instructions at home: Watch your condition for any changes. Follow these instructions as directed to lessen the pain that you are feeling.  Take medicines only as directed by your health care provider.  Rest the affected area for as long as your health care provider says that you should. If directed to do so, raise the painful joint above the level of your heart while you are sitting or lying down.  Do not do things that cause or worsen pain.  If directed, apply ice to the painful area: ? Put ice in a plastic bag. ? Place a towel between your skin and the bag. ? Leave the ice on for 20 minutes, 2-3 times per day.  Wear an elastic bandage, splint, or sling as directed by your health care provider. Loosen the elastic bandage or splint if your fingers or toes become numb and tingle, or if they turn cold and blue.  Begin exercising or stretching the affected area as directed by your health care provider. Ask your health care provider what types of exercise are safe for you.  Keep all follow-up visits as directed by your health care provider. This is important.  Contact a health care provider if:  Your  pain increases, and medicine does not help.  Your joint pain does not improve within 3 days.  You have increased bruising or swelling.  You have a fever.  You lose 10 lb (4.5 kg) or more without trying. Get help right away if:  You are not able to move the joint.  Your fingers or toes become numb or they turn cold and blue. This information is not intended to replace advice given to you by your health care provider. Make sure you discuss any questions you have with your health care provider. Document Released: 11/03/2005 Document Revised: 04/04/2016 Document Reviewed: 08/15/2014 Elsevier Interactive Patient Education  [employer].

## 2018-08-02 NOTE — Addendum Note (Signed)
Addended by: Kem Boroughs D on: 08/02/2018 04:33 PM   Modules accepted: Orders

## 2018-08-03 LAB — CBC WITH DIFFERENTIAL/PLATELET
BASOS PCT: 0.7 % (ref 0.0–3.0)
Basophils Absolute: 0.1 10*3/uL (ref 0.0–0.1)
EOS PCT: 2.3 % (ref 0.0–5.0)
Eosinophils Absolute: 0.2 10*3/uL (ref 0.0–0.7)
HCT: 47.8 % (ref 39.0–52.0)
Hemoglobin: 16.1 g/dL (ref 13.0–17.0)
LYMPHS ABS: 2.4 10*3/uL (ref 0.7–4.0)
Lymphocytes Relative: 32.2 % (ref 12.0–46.0)
MCHC: 33.7 g/dL (ref 30.0–36.0)
MCV: 86.1 fl (ref 78.0–100.0)
MONO ABS: 0.7 10*3/uL (ref 0.1–1.0)
MONOS PCT: 8.7 % (ref 3.0–12.0)
Neutro Abs: 4.2 10*3/uL (ref 1.4–7.7)
Neutrophils Relative %: 56.1 % (ref 43.0–77.0)
Platelets: 238 10*3/uL (ref 150.0–400.0)
RBC: 5.55 Mil/uL (ref 4.22–5.81)
RDW: 14.4 % (ref 11.5–15.5)
WBC: 7.5 10*3/uL (ref 4.0–10.5)

## 2018-08-03 LAB — HEMOGLOBIN A1C: HEMOGLOBIN A1C: 5.8 % (ref 4.6–6.5)

## 2018-08-03 NOTE — Telephone Encounter (Signed)
Patient seen in office 08/02/18

## 2018-08-05 LAB — ANA: ANA: NEGATIVE

## 2018-08-05 LAB — RHEUMATOID FACTOR

## 2018-08-16 ENCOUNTER — Encounter: Payer: Self-pay | Admitting: Family Medicine

## 2018-08-16 DIAGNOSIS — I10 Essential (primary) hypertension: Secondary | ICD-10-CM

## 2018-08-17 MED ORDER — VALSARTAN 320 MG PO TABS: 320.0000 mg | ORAL_TABLET | Freq: Every day | ORAL | 1 refills | Status: DC

## 2018-08-17 NOTE — Telephone Encounter (Signed)
Yes -- we know about the possible side effects of norvasc Ok to try to switch back to diovan --- please send in last dose we gave him --90 day 1 refill

## 2018-08-18 ENCOUNTER — Other Ambulatory Visit: Payer: Self-pay | Admitting: Family Medicine

## 2018-08-19 ENCOUNTER — Other Ambulatory Visit: Payer: Self-pay | Admitting: Family Medicine

## 2018-08-19 MED ORDER — TRAMADOL HCL 50 MG PO TABS: 50.0000 mg | ORAL_TABLET | Freq: Three times a day (TID) | ORAL | 0 refills | Status: DC | PRN

## 2018-08-19 NOTE — Telephone Encounter (Signed)
Yes. I have removed Valsartan from list and adjusted telmisartan.

## 2018-08-19 NOTE — Addendum Note (Signed)
Addended by: Ames Coupe on: 08/19/2018 12:59 PM   Modules accepted: Orders

## 2018-08-20 NOTE — Telephone Encounter (Signed)
Requesting: Tramadol 50mg  every 8hr prn Contract: none UDS: None, pending order from 03/16/18 Last OV: 08/02/18 Next Ov: N/A Last refill: 08/19/18, signed by Dr. Nani Ravens Database: multiple prescribers of tramadol in past few months, no obvious overlap noted.  Rx denied. Routed to Dr. Etter Sjogren to review.

## 2018-08-24 ENCOUNTER — Ambulatory Visit: Payer: BLUE CROSS/BLUE SHIELD | Admitting: Family Medicine

## 2018-10-02 ENCOUNTER — Other Ambulatory Visit: Payer: Self-pay | Admitting: Family Medicine

## 2018-10-04 ENCOUNTER — Ambulatory Visit: Payer: BLUE CROSS/BLUE SHIELD | Admitting: Family Medicine

## 2018-10-04 ENCOUNTER — Ambulatory Visit: Payer: BLUE CROSS/BLUE SHIELD | Admitting: Medical

## 2018-10-04 MED ORDER — TRAMADOL HCL 50 MG PO TABS: 50.0000 mg | ORAL_TABLET | Freq: Three times a day (TID) | ORAL | 0 refills | Status: DC | PRN

## 2018-10-04 NOTE — Telephone Encounter (Signed)
Pt is requesting refill on tramadol.   Last OV: 08/02/2018 Last Fill: 08/19/2018 #60 and 0RF UDS: None

## 2018-10-05 ENCOUNTER — Other Ambulatory Visit: Payer: Self-pay | Admitting: Family Medicine

## 2018-10-05 ENCOUNTER — Encounter: Payer: Self-pay | Admitting: Family Medicine

## 2018-10-05 DIAGNOSIS — M2550 Pain in unspecified joint: Secondary | ICD-10-CM

## 2018-10-06 ENCOUNTER — Encounter: Payer: Self-pay | Admitting: Medical

## 2018-10-06 ENCOUNTER — Ambulatory Visit: Payer: BLUE CROSS/BLUE SHIELD | Admitting: Medical

## 2018-10-06 VITALS — BP 170/80 | HR 70 | Temp 98.2°F | Resp 16 | Ht 70.0 in | Wt 205.6 lb

## 2018-10-06 DIAGNOSIS — J40 Bronchitis, not specified as acute or chronic: Secondary | ICD-10-CM | POA: Diagnosis not present

## 2018-10-06 DIAGNOSIS — R05 Cough: Secondary | ICD-10-CM

## 2018-10-06 DIAGNOSIS — R062 Wheezing: Secondary | ICD-10-CM | POA: Diagnosis not present

## 2018-10-06 DIAGNOSIS — R059 Cough, unspecified: Secondary | ICD-10-CM

## 2018-10-06 MED ORDER — AZITHROMYCIN 250 MG PO TABS: ORAL_TABLET | ORAL | 0 refills | Status: DC

## 2018-10-06 MED ORDER — ALBUTEROL SULFATE HFA 108 (90 BASE) MCG/ACT IN AERS: 2.0000 | INHALATION_SPRAY | Freq: Four times a day (QID) | RESPIRATORY_TRACT | 2 refills | Status: DC | PRN

## 2018-10-06 MED ORDER — HYDROCODONE-HOMATROPINE 5-1.5 MG/5ML PO SYRP: 5.0000 mL | ORAL_SOLUTION | Freq: Four times a day (QID) | ORAL | 0 refills | Status: DC | PRN

## 2018-10-06 NOTE — Addendum Note (Signed)
Addended by: Anabel Halon on: 10/06/2018 09:08 PM   Modules accepted: Orders

## 2018-10-06 NOTE — Patient Instructions (Addendum)
You appear to have bronchitis. Rest hydrate and tylenol for fever. I am prescribing cough medicine hycodan, and azithromycin  antibiotic.   For high blood pressure, I want you to check  Your blood pressure daily. Please check your bp and document readings. Bring those reading to appointment follow up.  You should gradually get better. If not then notify us and would recommend a chest xray.  For wheezing will make albuterol inhaler available.  Follow up in 2-3 weeks bp check and cmp to check kidney function and potassium level.

## 2018-10-06 NOTE — Progress Notes (Signed)
Subjective:    Patient ID: Thomas [name], male    DOB: [birth year], 67 y.o.   MRN: [id number]  HPI  Pt states since Thursday chest congestion and dry hacky cough x 6 weeks. Some wheezing intermittent. Has some runny nose and mild fatigue. No st and no sinus pressure.  Pt does have history of bronchitis and will often eventually need antibiotic.  No fever, no chills or sweats.  Pt bp initially high. He is on valsartan. I had mentioned to him today adding bp medication but he was reluctant expressing he wants to monitor his bp at home and bring readings in on follow up before adding new medications.    Review of Systems  Constitutional: Positive for fatigue. Negative for chills and fever.       Mild fatigue.  HENT: Negative for congestion, ear discharge and ear pain.   Respiratory: Positive for cough and wheezing. Negative for chest tightness and shortness of breath.        Mild wheeze/described reported.  Chest congestion.  Cardiovascular: Negative for chest pain and palpitations.  Musculoskeletal: Negative for back pain.  Skin: Negative for rash.  Neurological: Negative for dizziness, speech difficulty, weakness and headaches.  Hematological: Negative for adenopathy. Does not bruise/bleed easily.  Psychiatric/Behavioral: Negative for behavioral problems, confusion, decreased concentration and dysphoric mood. The patient is not nervous/anxious.    Past Medical History:  Diagnosis Date  . Chronic gout    multiple sites--- followed by pcp--- (04-14-2018  per pt last flare-up , march 2019)  . ED (erectile dysfunction)   . History of kidney stones   . History of squamous cell carcinoma excision   . Hyperlipidemia   . Hypertension   . Left ureteral calculus   . Urgency of urination   . Wears glasses      Social History   Socioeconomic History  . Marital status: Married    Spouse name: Not on file  . Number of children: Not on file  . Years of education: Not on file  .  Highest education level: Not on file  Occupational History  . Occupation: Pharmacist, hospital  Social Needs  . Financial resource strain: Not on file  . Food insecurity:    Worry: Not on file    Inability: Not on file  . Transportation needs:    Medical: Not on file    Non-medical: Not on file  Tobacco Use  . Smoking status: Former Smoker    Packs/day: 0.30    Years: 20.00    Pack years: 6.00    Types: Cigarettes    Last attempt to quit: 10/17/1994    Years since quitting: 23.9  . Smokeless tobacco: Never Used  Substance and Sexual Activity  . Alcohol use: Yes    Alcohol/week: 2.0 standard drinks    Types: 2 Glasses of wine per week  . Drug use: No  . Sexual activity: Yes  Lifestyle  . Physical activity:    Days per week: Not on file    Minutes per session: Not on file  . Stress: Not on file  Relationships  . Social connections:    Talks on phone: Not on file    Gets together: Not on file    Attends religious service: Not on file    Active member of club or organization: Not on file    Attends meetings of clubs or organizations: Not on file    Relationship status: Not on file  . Intimate partner violence:  Fear of current or ex partner: Not on file    Emotionally abused: Not on file    Physically abused: Not on file    Forced sexual activity: Not on file  Other Topics Concern  . Not on file  Social History Narrative   Exercise 1 hour a day ---3-4 days a week    Past Surgical History:  Procedure Laterality Date  . COLONOSCOPY WITH PROPOFOL  2017  . CYSTOSCOPY/RETROGRADE/URETEROSCOPY/STONE EXTRACTION WITH BASKET Left 04/16/2018   Procedure: CYSTOSCOPY/RETROGRADE/URETEROSCOPY/STONE EXTRACTION WITH BASKET/ HOLMIUM LASER LITHOTRIPSY/ STENT PLACEMENT;  Surgeon: Ardis Hughs, MD;  Location: [facility];  Service: Urology;  Laterality: Left;  . HOLMIUM LASER APPLICATION Left 8/67/6195   Procedure: HOLMIUM LASER APPLICATION;  Surgeon: Ardis Hughs, MD;   Location: [facility];  Service: Urology;  Laterality: Left;    Family History  Problem Relation Age of Onset  . Heart disease Mother 47  . Alcohol abuse Mother   . Depression Mother   . Cancer Sister        hodgkins, breast  . Hypertension Father   . Colon cancer Neg Hx   . Pancreatic cancer Neg Hx   . Rectal cancer Neg Hx   . Stomach cancer Neg Hx     No Active Allergies  Current Outpatient Medications on File Prior to Visit  Medication Sig Dispense Refill  . Colchicine (MITIGARE) 0.6 MG CAPS Take 1 tablet by mouth daily. 30 capsule 3  . FINACEA 15 % cream Use topical as directed (Patient taking differently: Apply topically daily as needed. Use topical as directed) 30 g 0  . MAGNESIUM PO Take 1 tablet by mouth daily.     No current facility-administered medications on file prior to visit.     BP (!) 183/80   Pulse 70   Temp 98.2 F (36.8 C) (Oral)   Resp 16   Ht 5\' 10"  (1.778 m)   Wt 205 lb 9.6 oz (93.3 kg)   SpO2 99%   BMI 29.50 kg/m       Objective:   Physical Exam  General  Mental Status - Alert. General Appearance - Well groomed. Not in acute distress.  Skin Rashes- No Rashes.  HEENT Head- Normal. Ear Auditory Canal - Left- Normal. Right - Normal.Tympanic Membrane- Left- Normal. Right- Normal. Eye Sclera/Conjunctiva- Left- Normal. Right- Normal. Nose & Sinuses Nasal Mucosa- Left-  Boggy and Congested. Right-  Boggy and  Congested.Bilateral no maxillary and no frontal sinus pressure. Mouth & Throat Lips: Upper Lip- Normal: no dryness, cracking, pallor, cyanosis, or vesicular eruption. Lower Lip-Normal: no dryness, cracking, pallor, cyanosis or vesicular eruption. Buccal Mucosa- Bilateral- No Aphthous ulcers. Oropharynx- No Discharge or Erythema. Tonsils: Characteristics- Bilateral- No Erythema or Congestion. Size/Enlargement- Bilateral- No enlargement. Discharge- bilateral-None.  Neck Neck- Supple. No Masses.   Chest and Lung  Exam Auscultation: Breath Sounds:-Clear even and unlabored.  Cardiovascular Auscultation:Rythm- Regular, rate and rhythm. Murmurs & Other Heart Sounds:Ausculatation of the heart reveal- No Murmurs.  Lymphatic Head & Neck General Head & Neck Lymphatics: Bilateral: Description- No Localized lymphadenopathy.   Neurologic Cranial Nerve exam:- CN III-XII intact(No nystagmus), symmetric smile. Strength:- 5/5 equal and symmetric strength both upper and lower extremities.      Assessment & Plan:  You appear to have bronchitis. Rest hydrate and tylenol for fever. I am prescribing cough medicine hycodan, and azithromycin  antibiotic.   For high blood pressure, I want you to check  Your blood pressure daily.  Please check your bp and document readings.  Bring those reading to appointment follow up.  You should gradually get better. If not then notify us and would recommend a chest xray.  Follow up in 2-3 weeks bp check and cmp to check kidney function and potassium level.  Mackie Pai, PA-C

## 2018-10-21 ENCOUNTER — Ambulatory Visit (INDEPENDENT_AMBULATORY_CARE_PROVIDER_SITE_OTHER): Payer: BLUE CROSS/BLUE SHIELD | Admitting: Medical

## 2018-10-21 ENCOUNTER — Encounter: Payer: Self-pay | Admitting: Medical

## 2018-10-21 VITALS — BP 170/83 | HR 65 | Temp 98.4°F | Resp 16 | Ht 72.0 in | Wt 207.6 lb

## 2018-10-21 DIAGNOSIS — I10 Essential (primary) hypertension: Secondary | ICD-10-CM

## 2018-10-21 MED ORDER — METOPROLOL SUCCINATE ER 50 MG PO TB24: 50.0000 mg | ORAL_TABLET | Freq: Every day | ORAL | 0 refills | Status: DC

## 2018-10-21 NOTE — Patient Instructions (Addendum)
Your blood pressure is moderate high today.  Will continue valsartan at same dose and will add moderate dose metoprolol extended release.  Potential side effects discussed today.  I want you to check your blood pressure and pulse daily over the next 7 days.  If blood pressure not coming down close to 140/90 then am considering adding diuretic to your regimen.  However since you do have a history of gout, I do want you to go ahead and check with your new insurance plan to see how they cover colchicine preventative treatment.   Follow-up in 7 to 10 days by my chart or please give [place] call in blood pressure and pulse readings.  If you have any high blood pressures with cardiac or neurologic signs symptoms then recommend ED evaluation.

## 2018-10-21 NOTE — Progress Notes (Signed)
Subjective:    Patient ID: Thomas [name], male    DOB: 01/25/1951, 67 y.o.   MRN: [id number]  HPI  Pt in for follow up.   His bronchitis is well controlled.   Pt bp was high and in past was on amlodipine and telmasartan.in place of valsartan when it was under recall.  Amlodipine caused pedal edema. So this waas stopped. Then eventually he explained.temesartan dc'd and he got back on valsartan. Since then bp has been high when he checked.  Pt admits his bp has been elevated 259-563 systolic range twice since last visit with me.   We had long talk today about diuretics and he is willing to be on this if needed.   Pt also has hx of elevated blood sugar.  We discussed today his rare/occasional gout flares.     Review of Systems  Constitutional: Negative for chills, fatigue and fever.  Respiratory: Negative for cough, chest tightness, shortness of breath and wheezing.   Cardiovascular: Negative for chest pain and palpitations.  Gastrointestinal: Negative for abdominal pain.  Musculoskeletal: Negative for myalgias.  Neurological: Negative for dizziness, syncope, weakness and headaches.  Hematological: Negative for adenopathy. Does not bruise/bleed easily.  Psychiatric/Behavioral: Negative for behavioral problems, confusion and dysphoric mood. The patient is not nervous/anxious.     Past Medical History:  Diagnosis Date  . Chronic gout    multiple sites--- followed by pcp--- (04-14-2018  per pt last flare-up , march 2019)  . ED (erectile dysfunction)   . History of kidney stones   . History of squamous cell carcinoma excision   . Hyperlipidemia   . Hypertension   . Left ureteral calculus   . Urgency of urination   . Wears glasses      Social History   Socioeconomic History  . Marital status: Married    Spouse name: Not on file  . Number of children: Not on file  . Years of education: Not on file  . Highest education level: Not on file  Occupational History  .  Occupation: Pharmacist, hospital  Social Needs  . Financial resource strain: Not on file  . Food insecurity:    Worry: Not on file    Inability: Not on file  . Transportation needs:    Medical: Not on file    Non-medical: Not on file  Tobacco Use  . Smoking status: Former Smoker    Packs/day: 0.30    Years: 20.00    Pack years: 6.00    Types: Cigarettes    Last attempt to quit: 10/17/1994    Years since quitting: 24.0  . Smokeless tobacco: Never Used  Substance and Sexual Activity  . Alcohol use: Yes    Alcohol/week: 2.0 standard drinks    Types: 2 Glasses of wine per week  . Drug use: No  . Sexual activity: Yes  Lifestyle  . Physical activity:    Days per week: Not on file    Minutes per session: Not on file  . Stress: Not on file  Relationships  . Social connections:    Talks on phone: Not on file    Gets together: Not on file    Attends religious service: Not on file    Active member of club or organization: Not on file    Attends meetings of clubs or organizations: Not on file    Relationship status: Not on file  . Intimate partner violence:    Fear of current or ex partner: Not on file  Emotionally abused: Not on file    Physically abused: Not on file    Forced sexual activity: Not on file  Other Topics Concern  . Not on file  Social History Narrative   Exercise 1 hour a day ---3-4 days a week    Past Surgical History:  Procedure Laterality Date  . COLONOSCOPY WITH PROPOFOL  2017  . CYSTOSCOPY/RETROGRADE/URETEROSCOPY/STONE EXTRACTION WITH BASKET Left 04/16/2018   Procedure: CYSTOSCOPY/RETROGRADE/URETEROSCOPY/STONE EXTRACTION WITH BASKET/ HOLMIUM LASER LITHOTRIPSY/ STENT PLACEMENT;  Surgeon: Ardis Hughs, MD;  Location[facility];  Service: Urology;  Laterality: Left;  . HOLMIUM LASER APPLICATION Left 05/10/7627   Procedure: HOLMIUM LASER APPLICATION;  Surgeon: Ardis Hughs, MD;  Location: [facility];  Service: Urology;   Laterality: Left;    Family History  Problem Relation Age of Onset  . Heart disease Mother 76  . Alcohol abuse Mother   . Depression Mother   . Cancer Sister        hodgkins, breast  . Hypertension Father   . Colon cancer Neg Hx   . Pancreatic cancer Neg Hx   . Rectal cancer Neg Hx   . Stomach cancer Neg Hx     No Active Allergies  Current Outpatient Medications on File Prior to Visit  Medication Sig Dispense Refill  . valsartan (DIOVAN) 320 MG tablet Take 320 mg by mouth daily.    . Colchicine (MITIGARE) 0.6 MG CAPS Take 1 tablet by mouth daily. 30 capsule 3  . FINACEA 15 % cream Use topical as directed (Patient taking differently: Apply topically daily as needed. Use topical as directed) 30 g 0  . HYDROcodone-homatropine (HYCODAN) 5-1.5 MG/5ML syrup Take 5 mLs by mouth every 6 (six) hours as needed for cough. 100 mL 0  . MAGNESIUM PO Take 1 tablet by mouth daily.     No current facility-administered medications on file prior to visit.     BP (!) 170/83   Pulse 65   Temp 98.4 F (36.9 C) (Oral)   Resp 16   Ht 6' (1.829 m)   Wt 207 lb 9.6 oz (94.2 kg)   SpO2 100%   BMI 28.16 kg/m       Objective:   Physical Exam  General Mental Status- Alert. General Appearance- Not in acute distress.   Skin General: Color- Normal Color. Moisture- Normal Moisture.    Chest and Lung Exam Auscultation: Breath Sounds:-Normal.  Cardiovascular Auscultation:Rythm- Regular. Murmurs & Other Heart Sounds:Auscultation of the heart reveals- No Murmurs.  Abdomen Inspection:-Inspeection Normal. Palpation/Percussion:Note:No mass. Palpation and Percussion of the abdomen reveal- Non Tender, Non Distended + BS, no rebound or guarding.   Neurologic Cranial Nerve exam:- CN III-XII intact(No nystagmus), symmetric smile. Strength:- 5/5 equal and symmetric strength both upper and lower extremities.      Assessment & Plan:  Your blood pressure is moderate high today.  Will  continue  valsartan at same dose and will add moderate dose metoprolol extended release.  Potential side effects discussed today.  I want you to check your blood pressure and pulse daily over the next 7 days.  If blood pressure not coming down close to 140/90 then am considering adding diuretic to your regimen.  However since you do have a history of gout, I do want you to go ahead and check with your new insurance plan to see how they cover colchicine preventative treatment.   Follow-up in 7 to 10 days by my chart or please give [place]  call in blood pressure and pulse readings.  If you have any high blood pressures with cardiac or neurologic signs symptoms then recommend ED evaluation.  Mackie Pai, PA-C

## 2018-11-13 ENCOUNTER — Encounter: Payer: Self-pay | Admitting: Family Medicine

## 2018-11-16 MED ORDER — VALSARTAN 320 MG PO TABS: 320.0000 mg | ORAL_TABLET | Freq: Every day | ORAL | 1 refills | Status: DC

## 2018-11-16 MED ORDER — COLCHICINE 0.6 MG PO CAPS: 1.0000 | ORAL_CAPSULE | Freq: Every day | ORAL | 3 refills | Status: DC

## 2018-11-21 ENCOUNTER — Other Ambulatory Visit: Payer: Self-pay | Admitting: Medical

## 2018-12-13 ENCOUNTER — Encounter: Payer: Self-pay | Admitting: Medical

## 2018-12-13 ENCOUNTER — Ambulatory Visit (INDEPENDENT_AMBULATORY_CARE_PROVIDER_SITE_OTHER): Payer: Medicare HMO | Admitting: Medical

## 2018-12-13 VITALS — BP 170/90 | HR 69 | Temp 98.3°F | Resp 16 | Ht 72.0 in | Wt 210.8 lb

## 2018-12-13 DIAGNOSIS — I10 Essential (primary) hypertension: Secondary | ICD-10-CM

## 2018-12-13 DIAGNOSIS — M109 Gout, unspecified: Secondary | ICD-10-CM | POA: Diagnosis not present

## 2018-12-13 DIAGNOSIS — J329 Chronic sinusitis, unspecified: Secondary | ICD-10-CM

## 2018-12-13 DIAGNOSIS — J40 Bronchitis, not specified as acute or chronic: Secondary | ICD-10-CM | POA: Diagnosis not present

## 2018-12-13 LAB — COMPREHENSIVE METABOLIC PANEL
ALK PHOS: 94 U/L (ref 39–117)
ALT: 30 U/L (ref 0–53)
AST: 21 U/L (ref 0–37)
Albumin: 4.4 g/dL (ref 3.5–5.2)
BUN: 14 mg/dL (ref 6–23)
CHLORIDE: 101 meq/L (ref 96–112)
CO2: 30 meq/L (ref 19–32)
Calcium: 9.3 mg/dL (ref 8.4–10.5)
Creatinine, Ser: 0.89 mg/dL (ref 0.40–1.50)
GFR: 85.56 mL/min (ref >60.00)
Glucose, Bld: 80 mg/dL (ref 70–99)
POTASSIUM: 4.2 meq/L (ref 3.5–5.1)
SODIUM: 140 meq/L (ref 135–145)
Total Bilirubin: 0.5 mg/dL (ref 0.2–1.2)
Total Protein: 7 g/dL (ref 6.0–8.3)

## 2018-12-13 LAB — URIC ACID: URIC ACID, SERUM: 7 mg/dL (ref 4.0–7.8)

## 2018-12-13 MED ORDER — HYDROCODONE-HOMATROPINE 5-1.5 MG/5ML PO SYRP: 5.0000 mL | ORAL_SOLUTION | Freq: Four times a day (QID) | ORAL | 0 refills | Status: DC | PRN

## 2018-12-13 MED ORDER — AZITHROMYCIN 250 MG PO TABS: ORAL_TABLET | ORAL | 0 refills | Status: DC

## 2018-12-13 MED ORDER — CLONIDINE HCL 0.1 MG PO TABS: 0.1000 mg | ORAL_TABLET | Freq: Two times a day (BID) | ORAL | 0 refills | Status: DC

## 2018-12-13 MED ORDER — FLUTICASONE PROPIONATE 50 MCG/ACT NA SUSP: 2.0000 | Freq: Every day | NASAL | 1 refills | Status: DC

## 2018-12-13 NOTE — Progress Notes (Signed)
Subjective:    Patient ID: Thomas [name], male    DOB: [birth year], 68 y.o.   MRN: [id number]  HPI  Pt in for some recent nasal and chest congestion. Symptoms now for about 7 days.   Not progressively getting better. He is getting dry hacky night cough. But occasional productive.  Faint sinus ha. No upper teeth pain. When blows his nose gets colored mucus.  No body aches, no fever or chills. Pt has not taken any decongestants.  Pt has htn. He is on valsartan. I added low dose metoprolol in the past. He states it did not drop his blood pressure. Pt has gout but no acute flare. In past amlodipine did not help. Pt speculates he may have had dizziness years ago with diuretics. No cardiac or neurologic signs or symptoms. Most of readings at home 893 sytolic. If repeats will occasionally see 810 reading sytolic.   Review of Systems  Constitutional: Negative for chills and fatigue.  HENT: Positive for congestion, sinus pressure and sinus pain. Negative for postnasal drip and sore throat.   Respiratory: Positive for cough. Negative for chest tightness, shortness of breath and wheezing.   Cardiovascular: Negative for chest pain and palpitations.  Gastrointestinal: Negative for abdominal pain.  Musculoskeletal: Negative for back pain, joint swelling and neck pain.  Neurological: Negative for dizziness, speech difficulty, weakness, numbness and headaches.  Hematological: Negative for adenopathy. Does not bruise/bleed easily.  Psychiatric/Behavioral: Negative for behavioral problems and confusion.    Past Medical History:  Diagnosis Date  . Chronic gout    multiple sites--- followed by pcp--- (04-14-2018  per pt last flare-up , march 2019)  . ED (erectile dysfunction)   . History of kidney stones   . History of squamous cell carcinoma excision   . Hyperlipidemia   . Hypertension   . Left ureteral calculus   . Urgency of urination   . Wears glasses      Social History    Socioeconomic History  . Marital status: Married    Spouse name: Not on file  . Number of children: Not on file  . Years of education: Not on file  . Highest education level: Not on file  Occupational History  . Occupation: Pharmacist, hospital  Social Needs  . Financial resource strain: Not on file  . Food insecurity:    Worry: Not on file    Inability: Not on file  . Transportation needs:    Medical: Not on file    Non-medical: Not on file  Tobacco Use  . Smoking status: Former Smoker    Packs/day: 0.30    Years: 20.00    Pack years: 6.00    Types: Cigarettes    Last attempt to quit: 10/17/1994    Years since quitting: 24.1  . Smokeless tobacco: Never Used  Substance and Sexual Activity  . Alcohol use: Yes    Alcohol/week: 2.0 standard drinks    Types: 2 Glasses of wine per week  . Drug use: No  . Sexual activity: Yes  Lifestyle  . Physical activity:    Days per week: Not on file    Minutes per session: Not on file  . Stress: Not on file  Relationships  . Social connections:    Talks on phone: Not on file    Gets together: Not on file    Attends religious service: Not on file    Active member of club or organization: Not on file    Attends meetings of  clubs or organizations: Not on file    Relationship status: Not on file  . Intimate partner violence:    Fear of current or ex partner: Not on file    Emotionally abused: Not on file    Physically abused: Not on file    Forced sexual activity: Not on file  Other Topics Concern  . Not on file  Social History Narrative   Exercise 1 hour a day ---3-4 days a week    Past Surgical History:  Procedure Laterality Date  . COLONOSCOPY WITH PROPOFOL  2017  . CYSTOSCOPY/RETROGRADE/URETEROSCOPY/STONE EXTRACTION WITH BASKET Left 04/16/2018   Procedure: CYSTOSCOPY/RETROGRADE/URETEROSCOPY/STONE EXTRACTION WITH BASKET/ HOLMIUM LASER LITHOTRIPSY/ STENT PLACEMENT;  Surgeon: Ardis Hughs, MD;  Location: [facility];   Service: Urology;  Laterality: Left;  . HOLMIUM LASER APPLICATION Left 0/98/1191   Procedure: HOLMIUM LASER APPLICATION;  Surgeon: Ardis Hughs, MD;  Location: [facility];  Service: Urology;  Laterality: Left;    Family History  Problem Relation Age of Onset  . Heart disease Mother 32  . Alcohol abuse Mother   . Depression Mother   . Cancer Sister        hodgkins, breast  . Hypertension Father   . Colon cancer Neg Hx   . Pancreatic cancer Neg Hx   . Rectal cancer Neg Hx   . Stomach cancer Neg Hx     No Active Allergies  Current Outpatient Medications on File Prior to Visit  Medication Sig Dispense Refill  . valsartan (DIOVAN) 320 MG tablet Take 1 tablet (320 mg total) by mouth daily. 90 tablet 1  . Colchicine (MITIGARE) 0.6 MG CAPS Take 1 tablet by mouth daily. (Patient not taking: Reported on 12/13/2018) 30 capsule 3  . MAGNESIUM PO Take 1 tablet by mouth daily.    . metoprolol succinate (TOPROL-XL) 50 MG 24 hr tablet TAKE 1 TABLET (50 MG TOTAL) BY MOUTH DAILY. TAKE WITH OR IMMEDIATELY FOLLOWING A MEAL. (Patient not taking: Reported on 12/13/2018) 90 tablet 1   No current facility-administered medications on file prior to visit.     BP (!) 177/79   Pulse 69   Temp 98.3 F (36.8 C) (Oral)   Resp 16   Ht 6' (1.829 m)   Wt 210 lb 12.8 oz (95.6 kg)   SpO2 98%   BMI 28.59 kg/m       Objective:   Physical Exam  General  Mental Status - Alert. General Appearance - Well groomed. Not in acute distress.  Skin Rashes- No Rashes.  HEENT Head- Normal. Ear Auditory Canal - Left- Normal. Right - Normal.Tympanic Membrane- Left- Normal. Right- Normal. Eye Sclera/Conjunctiva- Left- Normal. Right- Normal. Nose & Sinuses Nasal Mucosa- Left-  Boggy and Congested. Right-  Boggy and  Congested.Bilateral maxillary and frontal sinus pressure. Mouth & Throat Lips: Upper Lip- Normal: no dryness, cracking, pallor, cyanosis, or vesicular eruption. Lower  Lip-Normal: no dryness, cracking, pallor, cyanosis or vesicular eruption. Buccal Mucosa- Bilateral- No Aphthous ulcers. Oropharynx- No Discharge or Erythema. Tonsils: Characteristics- Bilateral- No Erythema or Congestion. Size/Enlargement- Bilateral- No enlargement. Discharge- bilateral-None.  Neck Neck- Supple. No Masses.   Chest and Lung Exam Auscultation: Breath Sounds:-Clear even and unlabored.  Cardiovascular Auscultation:Rythm- Regular, rate and rhythm. Murmurs & Other Heart Sounds:Ausculatation of the heart reveal- No Murmurs.  Lymphatic Head & Neck General Head & Neck Lymphatics: Bilateral: Description- No Localized lymphadenopathy.      Assessment & Plan:   You appear to have bronchitis  and sinusitis. Rest hydrate and tylenol for fever. I am prescribing cough medicine hycodan, and azithromycin antibiotic. For your nasal congestion rx flonase.   Your blood pressure is elevated today as it has been in the past.  Unfortunately your blood pressure did not decrease with metoprolol.  On review he had side effects with Norvasc and possible dizziness with diuretics years ago.  In addition you do have history of gout.  On considering having you start clonidine.  You want to wait until we get results of your uric acid test.  Will review that.  If your uric acid is on the higher side then would recommend starting the clonidine. Presently continue valsartan pending labs   You should gradually get better. If not then notify us and would recommend a chest xray.  Follow up in 7-10 days or as needed

## 2018-12-13 NOTE — Patient Instructions (Addendum)
You appear to have bronchitis and sinusitis. Rest hydrate and tylenol for fever. I am prescribing cough medicine hycodan, and azithromycin antibiotic. For your nasal congestion rx flonase.  Your blood pressure is elevated today as it has been in the past.  Unfortunately your blood pressure did not decrease with metoprolol.  On review you  had side effects with Norvasc and possible dizziness with diuretics years ago.  In addition you do have history of gout.  Considering having you start clonidine.  You want to wait until we get results of your uric acid test.  Will review that.  If your uric acid is on the higher side then would recommend starting the clonidine. Presently continue valsartan pending labs   You should gradually get better. If not then notify us and would recommend a chest xray.  Follow up in 7-10 days or as needed

## 2019-01-04 ENCOUNTER — Other Ambulatory Visit: Payer: Self-pay | Admitting: Medical

## 2019-01-07 ENCOUNTER — Encounter: Payer: Self-pay | Admitting: Family Medicine

## 2019-01-10 ENCOUNTER — Other Ambulatory Visit: Payer: Self-pay | Admitting: Family Medicine

## 2019-01-10 ENCOUNTER — Encounter: Payer: Self-pay | Admitting: Medical

## 2019-01-10 DIAGNOSIS — M10079 Idiopathic gout, unspecified ankle and foot: Secondary | ICD-10-CM

## 2019-01-10 MED ORDER — TRAMADOL HCL 50 MG PO TABS: 50.0000 mg | ORAL_TABLET | Freq: Three times a day (TID) | ORAL | 0 refills | Status: DC | PRN

## 2019-01-10 NOTE — Telephone Encounter (Signed)
I have sent it in.

## 2019-01-11 ENCOUNTER — Telehealth: Payer: Self-pay | Admitting: Medical

## 2019-01-11 MED ORDER — SILDENAFIL CITRATE 50 MG PO TABS: 50.0000 mg | ORAL_TABLET | Freq: Every day | ORAL | 0 refills | Status: DC | PRN

## 2019-01-11 NOTE — Telephone Encounter (Signed)
Rx viagra sent to pt pharmacy.

## 2019-01-26 ENCOUNTER — Other Ambulatory Visit: Payer: Self-pay | Admitting: Family Medicine

## 2019-01-26 ENCOUNTER — Other Ambulatory Visit: Payer: Self-pay | Admitting: Medical

## 2019-01-26 DIAGNOSIS — M10079 Idiopathic gout, unspecified ankle and foot: Secondary | ICD-10-CM

## 2019-01-27 ENCOUNTER — Other Ambulatory Visit: Payer: Self-pay | Admitting: Medical

## 2019-01-27 MED ORDER — TRAMADOL HCL 50 MG PO TABS: 50.0000 mg | ORAL_TABLET | Freq: Three times a day (TID) | ORAL | 0 refills | Status: DC | PRN

## 2019-01-27 NOTE — Telephone Encounter (Addendum)
Requesting: Tramadol Contract: N/A UDS: N/A Last OV: 08/02/2018 Next OV: N/A Last Refill: 01/10/2019, #30-0 RF Database:   Please advise

## 2019-01-28 ENCOUNTER — Encounter: Payer: Self-pay | Admitting: Medical

## 2019-01-28 MED ORDER — CLONIDINE HCL 0.1 MG PO TABS: 0.1000 mg | ORAL_TABLET | Freq: Two times a day (BID) | ORAL | 0 refills | Status: DC

## 2019-02-11 ENCOUNTER — Other Ambulatory Visit: Payer: Self-pay | Admitting: Family Medicine

## 2019-02-11 DIAGNOSIS — M10079 Idiopathic gout, unspecified ankle and foot: Secondary | ICD-10-CM

## 2019-02-11 MED ORDER — TRAMADOL HCL 50 MG PO TABS: 50.0000 mg | ORAL_TABLET | Freq: Three times a day (TID) | ORAL | 0 refills | Status: DC | PRN

## 2019-02-11 NOTE — Telephone Encounter (Signed)
Requesting: Tramadol  Contract: N/A  UDS: N/A Last OV: 12/13/2018 Next OV: N/A Last Refill: 01/27/2019, #30--0 RF Database:   Please advise

## 2019-02-13 ENCOUNTER — Encounter: Payer: Self-pay | Admitting: Family Medicine

## 2019-02-14 ENCOUNTER — Other Ambulatory Visit: Payer: Self-pay | Admitting: Family Medicine

## 2019-02-14 DIAGNOSIS — M10079 Idiopathic gout, unspecified ankle and foot: Secondary | ICD-10-CM

## 2019-02-14 MED ORDER — TRAMADOL HCL 50 MG PO TABS: 50.0000 mg | ORAL_TABLET | Freq: Three times a day (TID) | ORAL | 0 refills | Status: DC | PRN

## 2019-02-14 NOTE — Telephone Encounter (Signed)
Sent to [facility]

## 2019-03-04 ENCOUNTER — Other Ambulatory Visit: Payer: Self-pay | Admitting: Family Medicine

## 2019-03-04 DIAGNOSIS — M10079 Idiopathic gout, unspecified ankle and foot: Secondary | ICD-10-CM

## 2019-03-07 MED ORDER — TRAMADOL HCL 50 MG PO TABS: 50.0000 mg | ORAL_TABLET | Freq: Three times a day (TID) | ORAL | 0 refills | Status: DC | PRN

## 2019-03-07 NOTE — Telephone Encounter (Signed)
I will refill but we will needs uds, contract and q3 m ov if he will con't to use

## 2019-03-07 NOTE — Telephone Encounter (Signed)
Patient will call before running of this refill to schedule appointment.

## 2019-03-07 NOTE — Telephone Encounter (Signed)
Last written: 02/14/19 Last ov: 08/02/18 w/ lowne ==12/13/18 edward Next ov: none Contract: none UDS: none

## 2019-03-23 ENCOUNTER — Encounter: Payer: Self-pay | Admitting: Family Medicine

## 2019-03-28 MED ORDER — COLCHICINE 0.6 MG PO TABS: 0.6000 mg | ORAL_TABLET | Freq: Every day | ORAL | 1 refills | Status: DC

## 2019-04-15 ENCOUNTER — Encounter: Payer: Self-pay | Admitting: Family Medicine

## 2019-04-15 ENCOUNTER — Other Ambulatory Visit: Payer: Self-pay | Admitting: Family Medicine

## 2019-04-15 DIAGNOSIS — M10079 Idiopathic gout, unspecified ankle and foot: Secondary | ICD-10-CM

## 2019-04-15 MED ORDER — TRAMADOL HCL 50 MG PO TABS: 50.0000 mg | ORAL_TABLET | Freq: Three times a day (TID) | ORAL | 0 refills | Status: DC | PRN

## 2019-04-15 NOTE — Telephone Encounter (Signed)
I sent it in 

## 2019-04-17 ENCOUNTER — Encounter: Payer: Self-pay | Admitting: Medical

## 2019-04-19 ENCOUNTER — Other Ambulatory Visit: Payer: Self-pay | Admitting: Family Medicine

## 2019-04-19 MED ORDER — SILDENAFIL CITRATE 100 MG PO TABS: 100.0000 mg | ORAL_TABLET | Freq: Every day | ORAL | 0 refills | Status: DC | PRN

## 2019-04-19 NOTE — Telephone Encounter (Signed)
done

## 2019-04-20 ENCOUNTER — Other Ambulatory Visit: Payer: Self-pay | Admitting: Family Medicine

## 2019-04-21 ENCOUNTER — Other Ambulatory Visit: Payer: Self-pay | Admitting: Family Medicine

## 2019-05-01 ENCOUNTER — Other Ambulatory Visit: Payer: Self-pay | Admitting: Family Medicine

## 2019-05-03 ENCOUNTER — Encounter: Payer: Self-pay | Admitting: Family Medicine

## 2019-05-05 ENCOUNTER — Other Ambulatory Visit: Payer: Self-pay | Admitting: Family Medicine

## 2019-05-27 ENCOUNTER — Encounter: Payer: Self-pay | Admitting: Family Medicine

## 2019-05-27 ENCOUNTER — Other Ambulatory Visit: Payer: Self-pay | Admitting: Family Medicine

## 2019-05-27 DIAGNOSIS — M10079 Idiopathic gout, unspecified ankle and foot: Secondary | ICD-10-CM

## 2019-05-27 MED ORDER — TRAMADOL HCL 50 MG PO TABS: 50.0000 mg | ORAL_TABLET | Freq: Three times a day (TID) | ORAL | 0 refills | Status: DC | PRN

## 2019-06-24 ENCOUNTER — Encounter: Payer: Self-pay | Admitting: Family Medicine

## 2019-06-24 DIAGNOSIS — M10079 Idiopathic gout, unspecified ankle and foot: Secondary | ICD-10-CM

## 2019-06-27 MED ORDER — TRAMADOL HCL 50 MG PO TABS: 50.0000 mg | ORAL_TABLET | Freq: Three times a day (TID) | ORAL | 0 refills | Status: DC | PRN

## 2019-06-27 NOTE — Telephone Encounter (Signed)
Tramadol refill.   Last OV: 12/13/2018 w/ Edward Last Fill: 05/27/2019 #30 and 0RF UDS: None

## 2019-06-29 ENCOUNTER — Encounter: Payer: Self-pay | Admitting: Family Medicine

## 2019-07-07 ENCOUNTER — Encounter: Payer: Self-pay | Admitting: Family Medicine

## 2019-07-07 ENCOUNTER — Other Ambulatory Visit: Payer: Self-pay | Admitting: Family Medicine

## 2019-07-07 DIAGNOSIS — L719 Rosacea, unspecified: Secondary | ICD-10-CM

## 2019-07-07 MED ORDER — AZELEX 20 % EX CREA: TOPICAL_CREAM | Freq: Two times a day (BID) | CUTANEOUS | 1 refills | Status: DC

## 2019-07-24 ENCOUNTER — Encounter: Payer: Self-pay | Admitting: Family Medicine

## 2019-07-25 DIAGNOSIS — R69 Illness, unspecified: Secondary | ICD-10-CM | POA: Diagnosis not present

## 2019-07-26 ENCOUNTER — Other Ambulatory Visit: Payer: Self-pay | Admitting: Family Medicine

## 2019-07-26 ENCOUNTER — Other Ambulatory Visit: Payer: Self-pay

## 2019-07-26 DIAGNOSIS — L719 Rosacea, unspecified: Secondary | ICD-10-CM

## 2019-07-26 MED ORDER — FINACEA 15 % EX GEL: CUTANEOUS | 5 refills | Status: DC

## 2019-07-26 MED ORDER — AZELEX 20 % EX CREA: TOPICAL_CREAM | Freq: Two times a day (BID) | CUTANEOUS | 1 refills | Status: DC

## 2019-07-26 NOTE — Telephone Encounter (Signed)
[facility] resent to [facility]

## 2019-07-29 ENCOUNTER — Encounter: Payer: Self-pay | Admitting: Family Medicine

## 2019-07-29 ENCOUNTER — Other Ambulatory Visit: Payer: Self-pay | Admitting: Family Medicine

## 2019-07-29 DIAGNOSIS — M10079 Idiopathic gout, unspecified ankle and foot: Secondary | ICD-10-CM

## 2019-08-01 ENCOUNTER — Encounter: Payer: Self-pay | Admitting: *Deleted

## 2019-08-01 MED ORDER — TRAMADOL HCL 50 MG PO TABS: 50.0000 mg | ORAL_TABLET | Freq: Three times a day (TID) | ORAL | 0 refills | Status: DC | PRN

## 2019-08-01 NOTE — Telephone Encounter (Signed)
Last written: 06/27/19 Last ov: 12/13/18 Next ov: none (mychart message sent to patient to schedule) Contract: none UDS: none

## 2019-08-01 NOTE — Telephone Encounter (Signed)
I will refill but we need contract and uds

## 2019-08-01 NOTE — Telephone Encounter (Signed)
Requesting: Tramadol Contract: N/A UDS: N/A Last OV: 12/13/2018 Next OV: N/A Last Refill: 08/01/2019, #30--0RF Database:   Please advise

## 2019-08-03 NOTE — Telephone Encounter (Signed)
mychart message sent to patient

## 2019-08-04 ENCOUNTER — Other Ambulatory Visit: Payer: Self-pay | Admitting: Family Medicine

## 2019-08-14 ENCOUNTER — Other Ambulatory Visit: Payer: Self-pay | Admitting: Family Medicine

## 2019-08-29 ENCOUNTER — Encounter: Payer: Self-pay | Admitting: Family Medicine

## 2019-09-05 ENCOUNTER — Ambulatory Visit (INDEPENDENT_AMBULATORY_CARE_PROVIDER_SITE_OTHER): Payer: Medicare HMO | Admitting: Family Medicine

## 2019-09-05 ENCOUNTER — Other Ambulatory Visit: Payer: Self-pay | Admitting: Family Medicine

## 2019-09-05 ENCOUNTER — Other Ambulatory Visit: Payer: Self-pay

## 2019-09-05 ENCOUNTER — Encounter: Payer: Self-pay | Admitting: Family Medicine

## 2019-09-05 DIAGNOSIS — I10 Essential (primary) hypertension: Secondary | ICD-10-CM

## 2019-09-05 DIAGNOSIS — M10079 Idiopathic gout, unspecified ankle and foot: Secondary | ICD-10-CM | POA: Diagnosis not present

## 2019-09-05 DIAGNOSIS — M2550 Pain in unspecified joint: Secondary | ICD-10-CM

## 2019-09-05 MED ORDER — CLONIDINE HCL 0.2 MG PO TABS: 0.2000 mg | ORAL_TABLET | Freq: Two times a day (BID) | ORAL | 2 refills | Status: DC

## 2019-09-05 MED ORDER — TRAMADOL HCL 50 MG PO TABS: 50.0000 mg | ORAL_TABLET | Freq: Three times a day (TID) | ORAL | 0 refills | Status: DC | PRN

## 2019-09-05 NOTE — Progress Notes (Signed)
Virtual Visit via Video Note  I connected with Thomas [name] on 09/05/19 at  4:00 PM EDT by a video enabled telemedicine application and verified that I am speaking with the correct person using two identifiers.  Location: Patient: home  Provider: office   I discussed the limitations of evaluation and management by telemedicine and the availability of in person appointments. The patient expressed understanding and agreed to proceed.  History of Present Illness: Pt is home c/o elevated bp -- -no sob, no cp no palp Pt feels good otherwise-- no Headaches    Observations/Objective: .165-183/ 80-95   Afebrile Pt is in NAD  Assessment and Plan: 1. Pain in joint, multiple sites Chronic, ongoing  Last referral closed due to covid  Referral placed again - Ambulatory referral to Rheumatology - traMADol (ULTRAM) 50 MG tablet; Take 1 tablet (50 mg total) by mouth every 8 (eight) hours as needed.  Dispense: 45 tablet; Refill: 0  2. Essential hypertension Poorly controlled will alter medications, encouraged DASH diet, minimize caffeine and obtain adequate sleep. Report concerning symptoms and follow up as directed and as needed - cloNIDine (CATAPRES) 0.2 MG tablet; Take 1 tablet (0.2 mg total) by mouth 2 (two) times daily.  Dispense: 60 tablet; Refill: 2 Pt will f/u with my chart and let us know bp readings in 2-3 weeks 3. Acute idiopathic gout of foot, unspecified laterality Refill meds  F/u prn - traMADol (ULTRAM) 50 MG tablet; Take 1 tablet (50 mg total) by mouth every 8 (eight) hours as needed.  Dispense: 45 tablet; Refill: 0  Check labs next ov Follow Up Instructions:    I discussed the assessment and treatment plan with the patient. The patient was provided an opportunity to ask questions and all were answered. The patient agreed with the plan and demonstrated an understanding of the instructions.   The patient was advised to call back or seek an in-person evaluation if the symptoms  worsen or if the condition fails to improve as anticipated.  I provided 15 minutes of non-face-to-face time during this encounter.   Ann Held, DO

## 2019-09-29 ENCOUNTER — Other Ambulatory Visit: Payer: Self-pay | Admitting: Family Medicine

## 2019-09-30 ENCOUNTER — Other Ambulatory Visit: Payer: Self-pay | Admitting: Family Medicine

## 2019-10-07 ENCOUNTER — Other Ambulatory Visit: Payer: Self-pay | Admitting: Family Medicine

## 2019-10-07 DIAGNOSIS — Z6829 Body mass index (BMI) 29.0-29.9, adult: Secondary | ICD-10-CM | POA: Diagnosis not present

## 2019-10-07 DIAGNOSIS — M1A09X0 Idiopathic chronic gout, multiple sites, without tophus (tophi): Secondary | ICD-10-CM | POA: Diagnosis not present

## 2019-10-07 DIAGNOSIS — M2550 Pain in unspecified joint: Secondary | ICD-10-CM

## 2019-10-07 DIAGNOSIS — E663 Overweight: Secondary | ICD-10-CM | POA: Diagnosis not present

## 2019-10-07 DIAGNOSIS — M10079 Idiopathic gout, unspecified ankle and foot: Secondary | ICD-10-CM

## 2019-10-07 NOTE — Telephone Encounter (Signed)
Last Tramadol RX:  09/05/19, #45 Last OV: 09/05/19 Next OV: none scheduled UDS:  03/16/18, past due CSC:  Not on file

## 2019-10-09 ENCOUNTER — Other Ambulatory Visit: Payer: Self-pay | Admitting: Family Medicine

## 2019-10-24 DIAGNOSIS — M2550 Pain in unspecified joint: Secondary | ICD-10-CM | POA: Diagnosis not present

## 2019-10-24 DIAGNOSIS — M1A09X0 Idiopathic chronic gout, multiple sites, without tophus (tophi): Secondary | ICD-10-CM | POA: Diagnosis not present

## 2019-11-01 ENCOUNTER — Other Ambulatory Visit: Payer: Self-pay | Admitting: Family Medicine

## 2019-11-01 DIAGNOSIS — M10079 Idiopathic gout, unspecified ankle and foot: Secondary | ICD-10-CM

## 2019-11-01 DIAGNOSIS — M2550 Pain in unspecified joint: Secondary | ICD-10-CM

## 2019-11-01 MED ORDER — TRAMADOL HCL 50 MG PO TABS: 50.0000 mg | ORAL_TABLET | Freq: Three times a day (TID) | ORAL | 0 refills | Status: DC | PRN

## 2019-11-01 NOTE — Telephone Encounter (Signed)
I will send in but we will needs uds and contract

## 2019-11-01 NOTE — Telephone Encounter (Signed)
Last Tramadol RX: 10/07/19, #45 Last OV:  09/05/19 Next OV:  Not scheduled UDS:  Not on file CSC:   Not on file

## 2019-11-08 ENCOUNTER — Encounter: Payer: Self-pay | Admitting: Family Medicine

## 2019-11-29 ENCOUNTER — Other Ambulatory Visit: Payer: Self-pay | Admitting: Family Medicine

## 2019-11-29 DIAGNOSIS — I10 Essential (primary) hypertension: Secondary | ICD-10-CM

## 2019-12-01 ENCOUNTER — Encounter: Payer: Self-pay | Admitting: Family Medicine

## 2019-12-01 DIAGNOSIS — M10079 Idiopathic gout, unspecified ankle and foot: Secondary | ICD-10-CM

## 2019-12-01 DIAGNOSIS — M2550 Pain in unspecified joint: Secondary | ICD-10-CM

## 2019-12-01 MED ORDER — TRAMADOL HCL 50 MG PO TABS: 50.0000 mg | ORAL_TABLET | Freq: Three times a day (TID) | ORAL | 0 refills | Status: DC | PRN

## 2019-12-01 NOTE — Telephone Encounter (Signed)
Requesting: Tramadol Contract: N/A UDS: N/A Last OV: 09/05/2019 Next OV: N/A Last Refill: 11/01/2019, #45--0 RF Database:   Please advise

## 2019-12-03 ENCOUNTER — Encounter: Payer: Self-pay | Admitting: Family Medicine

## 2019-12-19 ENCOUNTER — Other Ambulatory Visit: Payer: Self-pay | Admitting: Family Medicine

## 2020-01-02 ENCOUNTER — Encounter: Payer: Self-pay | Admitting: Family Medicine

## 2020-01-02 DIAGNOSIS — M2550 Pain in unspecified joint: Secondary | ICD-10-CM

## 2020-01-02 DIAGNOSIS — M10079 Idiopathic gout, unspecified ankle and foot: Secondary | ICD-10-CM

## 2020-01-03 MED ORDER — TRAMADOL HCL 50 MG PO TABS: 50.0000 mg | ORAL_TABLET | Freq: Three times a day (TID) | ORAL | 0 refills | Status: DC | PRN

## 2020-01-03 NOTE — Telephone Encounter (Signed)
Requesting: Tramadol Contract: N/A UDS: N/A Last OV: 09/05/19 Next OV: N/A Last Refill: 12/01/19, #45--0 RF Database:   Please advise

## 2020-01-22 ENCOUNTER — Other Ambulatory Visit: Payer: Self-pay | Admitting: Family Medicine

## 2020-01-23 MED ORDER — COLCHICINE 0.6 MG PO TABS: 0.6000 mg | ORAL_TABLET | Freq: Every day | ORAL | 0 refills | Status: DC

## 2020-01-25 ENCOUNTER — Ambulatory Visit (INDEPENDENT_AMBULATORY_CARE_PROVIDER_SITE_OTHER): Payer: Medicare HMO | Admitting: Internal Medicine

## 2020-01-25 ENCOUNTER — Encounter: Payer: Self-pay | Admitting: Internal Medicine

## 2020-01-25 ENCOUNTER — Other Ambulatory Visit: Payer: Self-pay

## 2020-01-25 ENCOUNTER — Encounter: Payer: Self-pay | Admitting: Family Medicine

## 2020-01-25 VITALS — BP 191/78 | HR 70 | Temp 96.5°F | Resp 18 | Ht 72.0 in | Wt 215.4 lb

## 2020-01-25 DIAGNOSIS — I10 Essential (primary) hypertension: Secondary | ICD-10-CM | POA: Diagnosis not present

## 2020-01-25 DIAGNOSIS — R69 Illness, unspecified: Secondary | ICD-10-CM | POA: Diagnosis not present

## 2020-01-25 MED ORDER — HYDROCHLOROTHIAZIDE 25 MG PO TABS: 25.0000 mg | ORAL_TABLET | Freq: Every day | ORAL | 0 refills | Status: DC

## 2020-01-25 NOTE — Telephone Encounter (Signed)
Called pt. Left VM to take a additional clondine per Dr. Etter Sjogren and set up appointment tomorrow for Hypertension.

## 2020-01-25 NOTE — Progress Notes (Signed)
Pre visit review using our clinic review tool, if applicable. No additional management support is needed unless otherwise documented below in the visit note. 

## 2020-01-25 NOTE — Progress Notes (Signed)
Subjective:    Patient ID: Thomas [name], male    DOB: June 16, 1951, 69 y.o.   MRN: [id number]  DOS:  01/25/2020 Type of visit - description: Acute His main concern is hypertension management. The patient went to his dentist this morning, BP was 194/100. PCP recommend an additional clonidine which he took, subsequently, BP was 156/80. He is here for further advice.    Review of Systems Denies headache, chest pain. No difficulty breathing or edema Reports a very healthy lifestyle.  Past Medical History:  Diagnosis Date  . Chronic gout    multiple sites--- followed by pcp--- (04-14-2018  per pt last flare-up , march 2019)  . ED (erectile dysfunction)   . History of kidney stones   . History of squamous cell carcinoma excision   . Hyperlipidemia   . Hypertension   . Left ureteral calculus   . Urgency of urination   . Wears glasses     Past Surgical History:  Procedure Laterality Date  . COLONOSCOPY WITH PROPOFOL  2017  . CYSTOSCOPY/RETROGRADE/URETEROSCOPY/STONE EXTRACTION WITH BASKET Left 04/16/2018   Procedure: CYSTOSCOPY/RETROGRADE/URETEROSCOPY/STONE EXTRACTION WITH BASKET/ HOLMIUM LASER LITHOTRIPSY/ STENT PLACEMENT;  Surgeon: Ardis Hughs, MD;  Location: [facility];  Service: Urology;  Laterality: Left;  . HOLMIUM LASER APPLICATION Left 6/80/3212   Procedure: HOLMIUM LASER APPLICATION;  Surgeon: Ardis Hughs, MD;  Location: [facility];  Service: Urology;  Laterality: Left;    Allergies as of 01/25/2020   No Known Allergies     Medication List       Accurate as of January 25, 2020 11:59 PM. If you have any questions, ask your nurse or doctor.        STOP taking these medications   Azelex 20 % cream Generic drug: azelaic acid Stopped by: Kathlene November, MD   azithromycin 250 MG tablet Commonly known as: ZITHROMAX Stopped by: Kathlene November, MD   fluticasone 50 MCG/ACT nasal spray Commonly known as: FLONASE Stopped by: Kathlene November, MD   HYDROcodone-homatropine 5-1.5 MG/5ML syrup Commonly known as: HYCODAN Stopped by: Kathlene November, MD     TAKE these medications   b complex vitamins tablet Take 1 tablet by mouth daily.   cloNIDine 0.2 MG tablet Commonly known as: CATAPRES TAKE 1 TABLET (0.2 MG TOTAL) BY MOUTH 2 (TWO) TIMES DAILY.   colchicine 0.6 MG tablet Take 1 tablet (0.6 mg total) by mouth daily. What changed: Another medication with the same name was removed. Continue taking this medication, and follow the directions you see here. Changed by: Kathlene November, MD   Finacea 15 % cream Generic drug: Azelaic Acid Use small amount bid   hydrochlorothiazide 25 MG tablet Commonly known as: HYDRODIURIL Take 1 tablet (25 mg total) by mouth daily. Started by: Kathlene November, MD   ibuprofen 100 MG tablet Commonly known as: ADVIL Take 100 mg by mouth 3 (three) times daily as needed for pain.   MAGNESIUM PO Take 1 tablet by mouth daily.   sildenafil 100 MG tablet Commonly known as: VIAGRA Take 1 tablet (100 mg total) by mouth daily as needed for erectile dysfunction. What changed: Another medication with the same name was removed. Continue taking this medication, and follow the directions you see here. Changed by: Kathlene November, MD   traMADol 50 MG tablet Commonly known as: ULTRAM Take 1 tablet (50 mg total) by mouth every 8 (eight) hours as needed.   valsartan 320 MG tablet Commonly known as: DIOVAN  TAKE 1 TABLET BY MOUTH EVERY DAY          Objective:   Physical Exam BP (!) 191/78 (BP Location: Left Arm, Patient Position: Sitting, Cuff Size: Normal)   Pulse 70   Temp (!) 96.5 F (35.8 C) (Temporal)   Resp 18   Ht 6' (1.829 m)   Wt 215 lb 6 oz (97.7 kg)   SpO2 98%   BMI 29.21 kg/m   General:   Well developed, NAD, BMI noted. HEENT:  Normocephalic . Face symmetric, atraumatic Funduscopy, undilated: Limited but normal arterioles and no papilledema that I can tell Lungs:  CTA B Normal respiratory  effort, no intercostal retractions, no accessory muscle use. Heart: RRR,  no murmur.  Lower extremities: no pretibial edema bilaterally  Skin: Not pale. Not jaundice Neurologic:  alert & oriented X3.  Speech normal, gait appropriate for age and unassisted Psych--  Cognition and judgment appear intact.  Cooperative with normal attention span and concentration.  Behavior appropriate. No anxious or depressed appearing.      Assessment      69 year old gentleman, PMH includes HTN, gout, kidney stones, present with:  Uncontrolled HTN: The patient has a healthy lifestyle, reports chronically uncontrolled BP, currently takes losartan and clonidine twice a day. BP was high this morning, got a slightly better after extra clonidine. He is asymptomatic. Wonders about a diuretic. We agreed on the following: -Continue losartan and clonidine -Start HCTZ 25 mg daily, he is very aware that this may trigger gout episode consequently strongly recommend to take colchicine twice a day for 10 days as prevention strategy.  Long-term, he might need allopurinol. -BMP CBC today -Come back and see PCP in 2 weeks -Monitor BPs, if they are very high, he needs to come back or call sooner than 2 weeks, see AVS.   This visit occurred during the SARS-CoV-2 public health emergency.  Safety protocols were in place, including screening questions prior to the visit, additional usage of staff PPE, and extensive cleaning of exam room while observing appropriate contact time as indicated for disinfecting solutions.

## 2020-01-25 NOTE — Patient Instructions (Addendum)
GO TO THE LAB : Get the blood work     GO TO [place] back for a checkup in 2 weeks with your primary doctor, please make an appointment   Start hydrochlorothiazide 25 mg 1 tablet daily    Take colchicine twice a day for 10 days  Check your blood pressures daily BP GOAL is between 110/65 and  135/85. If it is consistently higher or lower, let me know If they are very high consistently (more than 175/95) call anytime  Avoid anti-inflammatories more than perhaps 1 tablet daily.  Tylenol is okay

## 2020-01-26 LAB — BASIC METABOLIC PANEL
BUN: 17 mg/dL (ref 6–23)
CO2: 29 mEq/L (ref 19–32)
Calcium: 9.1 mg/dL (ref 8.4–10.5)
Chloride: 102 mEq/L (ref 96–112)
Creatinine, Ser: 1.07 mg/dL (ref 0.40–1.50)
GFR: 68.94 mL/min (ref >60.00)
Glucose, Bld: 129 mg/dL — ABNORMAL HIGH (ref 70–99)
Potassium: 3.8 mEq/L (ref 3.5–5.1)
Sodium: 138 mEq/L (ref 135–145)

## 2020-01-26 LAB — CBC WITH DIFFERENTIAL/PLATELET
Basophils Absolute: 0.1 10*3/uL (ref 0.0–0.1)
Basophils Relative: 1 % (ref 0.0–3.0)
Eosinophils Absolute: 0.1 10*3/uL (ref 0.0–0.7)
Eosinophils Relative: 1.2 % (ref 0.0–5.0)
HCT: 46.7 % (ref 39.0–52.0)
Hemoglobin: 15.7 g/dL (ref 13.0–17.0)
Lymphocytes Relative: 29.6 % (ref 12.0–46.0)
Lymphs Abs: 2.8 10*3/uL (ref 0.7–4.0)
MCHC: 33.6 g/dL (ref 30.0–36.0)
MCV: 86.9 fl (ref 78.0–100.0)
Monocytes Absolute: 0.8 10*3/uL (ref 0.1–1.0)
Monocytes Relative: 8.8 % (ref 3.0–12.0)
Neutro Abs: 5.7 10*3/uL (ref 1.4–7.7)
Neutrophils Relative %: 59.4 % (ref 43.0–77.0)
Platelets: 269 10*3/uL (ref 150.0–400.0)
RBC: 5.37 Mil/uL (ref 4.22–5.81)
RDW: 13.8 % (ref 11.5–15.5)
WBC: 9.6 10*3/uL (ref 4.0–10.5)

## 2020-01-29 ENCOUNTER — Encounter: Payer: Self-pay | Admitting: Family Medicine

## 2020-01-30 NOTE — Telephone Encounter (Signed)
Please advise 

## 2020-01-30 NOTE — Telephone Encounter (Signed)
I happy to discuss this with him in the office but endo does not treat hbp Cardiology can

## 2020-01-31 NOTE — Telephone Encounter (Signed)
Please schedule patient for an in person or virtual visit with pcp

## 2020-02-02 ENCOUNTER — Ambulatory Visit (INDEPENDENT_AMBULATORY_CARE_PROVIDER_SITE_OTHER): Payer: Medicare HMO | Admitting: Family Medicine

## 2020-02-02 ENCOUNTER — Other Ambulatory Visit: Payer: Self-pay

## 2020-02-02 ENCOUNTER — Encounter: Payer: Self-pay | Admitting: Family Medicine

## 2020-02-02 VITALS — BP 185/90 | HR 67 | Ht 72.0 in | Wt 215.0 lb

## 2020-02-02 DIAGNOSIS — I10 Essential (primary) hypertension: Secondary | ICD-10-CM | POA: Diagnosis not present

## 2020-02-02 DIAGNOSIS — R600 Localized edema: Secondary | ICD-10-CM

## 2020-02-02 NOTE — Patient Instructions (Signed)
DASH Eating Plan DASH stands for "Dietary Approaches to Stop Hypertension." The DASH eating plan is a healthy eating plan that has been shown to reduce high blood pressure (hypertension). It may also reduce your risk for type 2 diabetes, heart disease, and stroke. The DASH eating plan may also help with weight loss. What are tips for following this plan?  General guidelines  Avoid eating more than 2,300 mg (milligrams) of salt (sodium) a day. If you have hypertension, you may need to reduce your sodium intake to 1,500 mg a day.  Limit alcohol intake to no more than 1 drink a day for nonpregnant women and 2 drinks a day for men. One drink equals 12 oz of beer, 5 oz of wine, or 1 oz of hard liquor.  Work with your health care provider to maintain a healthy body weight or to lose weight. Ask what an ideal weight is for you.  Get at least 30 minutes of exercise that causes your heart to beat faster (aerobic exercise) most days of the week. Activities may include walking, swimming, or biking.  Work with your health care provider or diet and nutrition specialist (dietitian) to adjust your eating plan to your individual calorie needs. Reading food labels   Check food labels for the amount of sodium per serving. Choose foods with less than 5 percent of the Daily Value of sodium. Generally, foods with less than 300 mg of sodium per serving fit into this eating plan.  To find whole grains, look for the word "whole" as the first word in the ingredient list. Shopping  Buy products labeled as "low-sodium" or "no salt added."  Buy fresh foods. Avoid canned foods and premade or frozen meals. Cooking  Avoid adding salt when cooking. Use salt-free seasonings or herbs instead of table salt or sea salt. Check with your health care provider or pharmacist before using salt substitutes.  Do not fry foods. Cook foods using healthy methods such as baking, boiling, grilling, and broiling instead.  Cook with  heart-healthy oils, such as olive, canola, soybean, or sunflower oil. Meal planning  Eat a balanced diet that includes: ? 5 or more servings of fruits and vegetables each day. At each meal, try to fill half of your plate with fruits and vegetables. ? Up to 6-8 servings of whole grains each day. ? Less than 6 oz of lean meat, poultry, or fish each day. A 3-oz serving of meat is about the same size as a deck of cards. One egg equals 1 oz. ? 2 servings of low-fat dairy each day. ? A serving of nuts, seeds, or beans 5 times each week. ? Heart-healthy fats. Healthy fats called Omega-3 fatty acids are found in foods such as flaxseeds and coldwater fish, like sardines, salmon, and mackerel.  Limit how much you eat of the following: ? Canned or prepackaged foods. ? Food that is high in trans fat, such as fried foods. ? Food that is high in saturated fat, such as fatty meat. ? Sweets, desserts, sugary drinks, and other foods with added sugar. ? Full-fat dairy products.  Do not salt foods before eating.  Try to eat at least 2 vegetarian meals each week.  Eat more home-cooked food and less restaurant, buffet, and fast food.  When eating at a restaurant, ask that your food be prepared with less salt or no salt, if possible. What foods are recommended? The items listed may not be a complete list. Talk with your dietitian about   what dietary choices are best for you. Grains Whole-grain or whole-wheat bread. Whole-grain or whole-wheat pasta. Brown rice. Oatmeal. Quinoa. Bulgur. Whole-grain and low-sodium cereals. Pita bread. Low-fat, low-sodium crackers. Whole-wheat flour tortillas. Vegetables Fresh or frozen vegetables (raw, steamed, roasted, or grilled). Low-sodium or reduced-sodium tomato and vegetable juice. Low-sodium or reduced-sodium tomato sauce and tomato paste. Low-sodium or reduced-sodium canned vegetables. Fruits All fresh, dried, or frozen fruit. Canned fruit in natural juice (without  added sugar). Meat and other protein foods Skinless chicken or turkey. Ground chicken or turkey. Pork with fat trimmed off. Fish and seafood. Egg whites. Dried beans, peas, or lentils. Unsalted nuts, nut butters, and seeds. Unsalted canned beans. Lean cuts of beef with fat trimmed off. Low-sodium, lean deli meat. Dairy Low-fat (1%) or fat-free (skim) milk. Fat-free, low-fat, or reduced-fat cheeses. Nonfat, low-sodium ricotta or cottage cheese. Low-fat or nonfat yogurt. Low-fat, low-sodium cheese. Fats and oils Soft margarine without trans fats. Vegetable oil. Low-fat, reduced-fat, or light mayonnaise and salad dressings (reduced-sodium). Canola, safflower, olive, soybean, and sunflower oils. Avocado. Seasoning and other foods Herbs. Spices. Seasoning mixes without salt. Unsalted popcorn and pretzels. Fat-free sweets. What foods are not recommended? The items listed may not be a complete list. Talk with your dietitian about what dietary choices are best for you. Grains Baked goods made with fat, such as croissants, muffins, or some breads. Dry pasta or rice meal packs. Vegetables Creamed or fried vegetables. Vegetables in a cheese sauce. Regular canned vegetables (not low-sodium or reduced-sodium). Regular canned tomato sauce and paste (not low-sodium or reduced-sodium). Regular tomato and vegetable juice (not low-sodium or reduced-sodium). Pickles. Olives. Fruits Canned fruit in a light or heavy syrup. Fried fruit. Fruit in cream or butter sauce. Meat and other protein foods Fatty cuts of meat. Ribs. Fried meat. Bacon. Sausage. Bologna and other processed lunch meats. Salami. Fatback. Hotdogs. Bratwurst. Salted nuts and seeds. Canned beans with added salt. Canned or smoked fish. Whole eggs or egg yolks. Chicken or turkey with skin. Dairy Whole or 2% milk, cream, and half-and-half. Whole or full-fat cream cheese. Whole-fat or sweetened yogurt. Full-fat cheese. Nondairy creamers. Whipped toppings.  Processed cheese and cheese spreads. Fats and oils Butter. Stick margarine. Lard. Shortening. Ghee. Bacon fat. Tropical oils, such as coconut, palm kernel, or palm oil. Seasoning and other foods Salted popcorn and pretzels. Onion salt, garlic salt, seasoned salt, table salt, and sea salt. Worcestershire sauce. Tartar sauce. Barbecue sauce. Teriyaki sauce. Soy sauce, including reduced-sodium. Steak sauce. Canned and packaged gravies. Fish sauce. Oyster sauce. Cocktail sauce. Horseradish that you find on the shelf. Ketchup. Mustard. Meat flavorings and tenderizers. Bouillon cubes. Hot sauce and Tabasco sauce. Premade or packaged marinades. Premade or packaged taco seasonings. Relishes. Regular salad dressings. Where to find more information:  National Heart, Lung, and Blood Institute: www.nhlbi.nih.gov  American Heart Association: www.heart.org Summary  The DASH eating plan is a healthy eating plan that has been shown to reduce high blood pressure (hypertension). It may also reduce your risk for type 2 diabetes, heart disease, and stroke.  With the DASH eating plan, you should limit salt (sodium) intake to 2,300 mg a day. If you have hypertension, you may need to reduce your sodium intake to 1,500 mg a day.  When on the DASH eating plan, aim to eat more fresh fruits and vegetables, whole grains, lean proteins, low-fat dairy, and heart-healthy fats.  Work with your health care provider or diet and nutrition specialist (dietitian) to adjust your eating plan to your   individual calorie needs. This information is not intended to replace advice given to you by your health care provider. Make sure you discuss any questions you have with your health care provider. Document Revised: 10/16/2017 Document Reviewed: 10/27/2016 Elsevier Patient Education  2020 Elsevier Inc.  

## 2020-02-02 NOTE — Progress Notes (Signed)
Virtual Visit via Video Note  I connected with Thomas [name] on 02/02/20 at 11:20 AM EDT by a video enabled telemedicine application and verified that I am speaking with the correct person using two identifiers.  Location: Patient: work in Cytogeneticist: office    I discussed the limitations of evaluation and management by telemedicine and the availability of in person appointments. The patient expressed understanding and agreed to proceed.  History of Present Illness: Pt is c/o high bp.  No chest pain, no sob, no palpitations Pt bp has been running high.  He has been on many meds that have not worked  He is requesting a referral  He is taking hctz, clonidine and diovan=== hctz just started sat.    Observations/Objective: Vitals:   02/02/20 1113  BP: (!) 185/90  Pulse: 67  pt is in nad  Assessment and Plan: 1. Uncontrolled hypertension con't meds Check labs and test below Refer to cardiology - Cortisol - Thyroid Panel With TSH - Comprehensive metabolic panel - ECHOCARDIOGRAM COMPLETE; Future - US Renal Artery Stenosis; Future - Ambulatory referral to Cardiology  2. Lower extremity edema con't hctz  - ECHOCARDIOGRAM COMPLETE; Future - Ambulatory referral to Cardiology   Follow Up Instructions:    I discussed the assessment and treatment plan with the patient. The patient was provided an opportunity to ask questions and all were answered. The patient agreed with the plan and demonstrated an understanding of the instructions.   The patient was advised to call back or seek an in-person evaluation if the symptoms worsen or if the condition fails to improve as anticipated.  I provided 30 minutes of non-face-to-face time during this encounter.   Ann Held, DO

## 2020-02-08 ENCOUNTER — Other Ambulatory Visit (HOSPITAL_BASED_OUTPATIENT_CLINIC_OR_DEPARTMENT_OTHER): Payer: Medicare HMO

## 2020-02-08 ENCOUNTER — Encounter (HOSPITAL_BASED_OUTPATIENT_CLINIC_OR_DEPARTMENT_OTHER): Payer: Medicare HMO

## 2020-02-09 ENCOUNTER — Other Ambulatory Visit: Payer: Self-pay | Admitting: Family Medicine

## 2020-02-09 ENCOUNTER — Other Ambulatory Visit: Payer: Self-pay | Admitting: *Deleted

## 2020-02-09 ENCOUNTER — Encounter: Payer: Self-pay | Admitting: Family Medicine

## 2020-02-09 DIAGNOSIS — M10079 Idiopathic gout, unspecified ankle and foot: Secondary | ICD-10-CM

## 2020-02-09 DIAGNOSIS — M2550 Pain in unspecified joint: Secondary | ICD-10-CM

## 2020-02-09 DIAGNOSIS — Z79899 Other long term (current) drug therapy: Secondary | ICD-10-CM

## 2020-02-09 MED ORDER — TRAMADOL HCL 50 MG PO TABS: 50.0000 mg | ORAL_TABLET | Freq: Three times a day (TID) | ORAL | 0 refills | Status: DC | PRN

## 2020-02-09 NOTE — Telephone Encounter (Signed)
CSC placed at the front

## 2020-02-09 NOTE — Telephone Encounter (Signed)
I refilled Please add uds to labs--- pt should be coming in for labs Pt also needs to fill out contract

## 2020-02-09 NOTE — Telephone Encounter (Signed)
Nira Conn can you please print out contract for his tramdol and place up front.

## 2020-02-10 ENCOUNTER — Encounter: Payer: Self-pay | Admitting: Cardiology

## 2020-02-10 ENCOUNTER — Ambulatory Visit: Payer: Medicare HMO | Admitting: Cardiology

## 2020-02-10 ENCOUNTER — Other Ambulatory Visit: Payer: Self-pay

## 2020-02-10 VITALS — BP 200/98 | HR 79 | Temp 98.1°F | Ht 70.0 in | Wt 212.0 lb

## 2020-02-10 DIAGNOSIS — R400 Somnolence: Secondary | ICD-10-CM | POA: Diagnosis not present

## 2020-02-10 DIAGNOSIS — I10 Essential (primary) hypertension: Secondary | ICD-10-CM | POA: Diagnosis not present

## 2020-02-10 DIAGNOSIS — R0600 Dyspnea, unspecified: Secondary | ICD-10-CM

## 2020-02-10 DIAGNOSIS — R0609 Other forms of dyspnea: Secondary | ICD-10-CM | POA: Insufficient documentation

## 2020-02-10 DIAGNOSIS — M2550 Pain in unspecified joint: Secondary | ICD-10-CM | POA: Diagnosis not present

## 2020-02-10 HISTORY — DX: Other forms of dyspnea: R06.09

## 2020-02-10 HISTORY — DX: Dyspnea, unspecified: R06.00

## 2020-02-10 NOTE — Patient Instructions (Addendum)
Medication Instructions:  Your physician recommends that you continue on your current medications as directed. Please refer to the Current Medication list given to you today.  *If you need a refill on your cardiac medications before your next appointment, please call your pharmacy*   Lab Work: Your physician recommends that you return for lab work today: bmp   If you have labs (blood work) drawn today and your tests are completely normal, you will receive your results only by: Marland Kitchen MyChart Message (if you have MyChart) OR . A paper copy in the mail If you have any lab test that is abnormal or we need to change your treatment, we will call you to review the results.   Testing/Procedures: Your physician has recommended that you have a sleep study. This test records several body functions during sleep, including: brain activity, eye movement, oxygen and carbon dioxide blood levels, heart rate and rhythm, breathing rate and rhythm, the flow of air through your mouth and nose, snoring, body muscle movements, and chest and belly movement.     Follow-Up: At [facility], you and your health needs are our priority.  As part of our continuing mission to provide you with exceptional heart care, we have created designated Provider Care Teams.  These Care Teams include your primary Cardiologist (physician) and Advanced Practice Providers (APPs -  Physician Assistants and Nurse Practitioners) who all work together to provide you with the care you need, when you need it.  We recommend signing up for the patient portal called "MyChart".  Sign up information is provided on this After Visit Summary.  MyChart is used to connect with patients for Virtual Visits (Telemedicine).  Patients are able to view lab/test results, encounter notes, upcoming appointments, etc.  Non-urgent messages can be sent to your provider as well.   To learn more about what you can do with MyChart, go to [email or web address].     Your next appointment:   2 week(s)  The format for your next appointment:   Virtual Visit   Provider:   Jenne Campus, MD   Other Instructions

## 2020-02-10 NOTE — Progress Notes (Signed)
Cardiology Consultation:    Date:  02/10/2020   ID:  JESSE NOSBISCH, DOB [birth year], MRN [id number]  PCP:  Carollee Herter, Alferd Apa, DO  Cardiologist:  Jenne Campus, MD   Referring MD: Carollee Herter, Alferd Apa, *   No chief complaint on file. Have a high blood pressure  History of Present Illness:    COBAIN MORICI is a 69 y.o. male who is being seen today for the evaluation of hypertension at the request of Carollee Herter, Alferd Apa, *.  He has been having high blood pressure for many years.  Especially lately become more difficult to control.  He takes multiple medication including diuretic in spite of that his blood pressure is still not at target.  Denies have any symptoms of it.  Sometimes he said he will feel some flushing in the face but denies having any chest pain tightness squeezing pressure burning chest.  He try to exercise on the regular basis he walks about a mile every day.  He gets some short of breath while doing it but no chest pain.  He is trying to stick with good diet, he is very well aware of the fact that high salt content will bring his blood pressure up and he is trying to eliminate that. Overall he considers himself into pretty good shape. He does have pain in multiple joints, he did see rheumatologist however no rheumatological problems cause being identified.  He was told to have simply osteoarthritis.  He is also suffering from gout. Smoke long time ago quit does not smoke anymore. Does not drink alcohol on the regular basis he tells me that he may finish 1 bottle of wine a month. He does have some family history of hypertension.  Past Medical History:  Diagnosis Date  . Chronic gout    multiple sites--- followed by pcp--- (04-14-2018  per pt last flare-up , march 2019)  . ED (erectile dysfunction)   . History of kidney stones   . History of squamous cell carcinoma excision   . Hyperlipidemia   . Hypertension   . Left ureteral calculus   . Urgency of urination    . Wears glasses     Past Surgical History:  Procedure Laterality Date  . COLONOSCOPY WITH PROPOFOL  2017  . CYSTOSCOPY/RETROGRADE/URETEROSCOPY/STONE EXTRACTION WITH BASKET Left 04/16/2018   Procedure: CYSTOSCOPY/RETROGRADE/URETEROSCOPY/STONE EXTRACTION WITH BASKET/ HOLMIUM LASER LITHOTRIPSY/ STENT PLACEMENT;  Surgeon: Ardis Hughs, MD;  [facility];  Service: Urology;  Laterality: Left;  . HOLMIUM LASER APPLICATION Left 9/38/1017   Procedure: HOLMIUM LASER APPLICATION;  Surgeon: Ardis Hughs, MD;  Location: [facility];  Service: Urology;  Laterality: Left;    Current Medications: Current Meds  Medication Sig  . b complex vitamins tablet Take 1 tablet by mouth daily.  . cloNIDine (CATAPRES) 0.2 MG tablet TAKE 1 TABLET (0.2 MG TOTAL) BY MOUTH 2 (TWO) TIMES DAILY.  Marland Kitchen colchicine 0.6 MG tablet Take 0.6 mg by mouth 2 (two) times daily.  Marland Kitchen FINACEA 15 % cream Use small amount bid  . hydrochlorothiazide (HYDRODIURIL) 25 MG tablet Take 1 tablet (25 mg total) by mouth daily.  Marland Kitchen ibuprofen (ADVIL) 100 MG tablet Take 100 mg by mouth 3 (three) times daily as needed for pain.  Marland Kitchen MAGNESIUM PO Take 1 tablet by mouth daily.  . traMADol (ULTRAM) 50 MG tablet Take 1 tablet (50 mg total) by mouth every 8 (eight) hours as needed.  . valsartan (DIOVAN)  320 MG tablet TAKE 1 TABLET BY MOUTH EVERY DAY     Allergies:   Patient has no known allergies.   Social History   Socioeconomic History  . Marital status: Married    Spouse name: Not on file  . Number of children: Not on file  . Years of education: Not on file  . Highest education level: Not on file  Occupational History  . Occupation: Pharmacist, hospital  Tobacco Use  . Smoking status: Former Smoker    Packs/day: 0.30    Years: 20.00    Pack years: 6.00    Types: Cigarettes    Quit date: 10/17/1994    Years since quitting: 25.3  . Smokeless tobacco: Never Used  Substance and Sexual Activity  .  Alcohol use: Yes    Alcohol/week: 2.0 standard drinks    Types: 2 Glasses of wine per week  . Drug use: No  . Sexual activity: Yes  Other Topics Concern  . Not on file  Social History Narrative   Exercise 1 hour a day ---3-4 days a week   Social Determinants of Health   Financial Resource Strain:   . Difficulty of Paying Living Expenses:   Food Insecurity:   . Worried About Charity fundraiser in the Last Year:   . Arboriculturist in the Last Year:   Transportation Needs:   . Film/video editor (Medical):   Marland Kitchen Lack of Transportation (Non-Medical):   Physical Activity:   . Days of Exercise per Week:   . Minutes of Exercise per Session:   Stress:   . Feeling of Stress :   Social Connections:   . Frequency of Communication with Friends and Family:   . Frequency of Social Gatherings with Friends and Family:   . Attends Religious Services:   . Active Member of Clubs or Organizations:   . Attends Archivist Meetings:   Marland Kitchen Marital Status:      Family History: The patient's family history includes Alcohol abuse in his mother; Cancer in his sister; Depression in his mother; Heart disease (age of onset: 41) in his mother; Hypertension in his father. There is no history of Colon cancer, Pancreatic cancer, Rectal cancer, or Stomach cancer. ROS:   Please see the history of present illness.    All 14 point review of systems negative except as described per history of present illness.  EKGs/Labs/Other Studies Reviewed:    The following studies were reviewed today: Normal sinus rhythm, normal P interval, no ST segment changes, no LVH this is the EKG done today in our office    Recent Labs: 01/25/2020: BUN 17; Creatinine, Ser 1.07; Hemoglobin 15.7; Platelets 269.0; Potassium 3.8; Sodium 138  Recent Lipid Panel    Component Value Date/Time   CHOL 209 (H) 05/04/2018 1104   TRIG 135.0 05/04/2018 1104   HDL 42.80 05/04/2018 1104   CHOLHDL 5 05/04/2018 1104   VLDL 27.0  05/04/2018 1104   LDLCALC 139 (H) 05/04/2018 1104    Physical Exam:    VS:  BP (!) 200/98   Pulse 79   Temp 98.1 F (36.7 C) (Temporal)   Ht 5\' 10"  (1.778 m)   Wt 212 lb (96.2 kg)   SpO2 98%   BMI 30.42 kg/m     Wt Readings from Last 3 Encounters:  02/10/20 212 lb (96.2 kg)  02/02/20 215 lb (97.5 kg)  01/25/20 215 lb 6 oz (97.7 kg)     GEN:  Well  nourished, well developed in no acute distress HEENT: Normal NECK: No JVD; No carotid bruits LYMPHATICS: No lymphadenopathy CARDIAC: RRR, no murmurs, no rubs, no gallops RESPIRATORY:  Clear to auscultation without rales, wheezing or rhonchi  ABDOMEN: Soft, non-tender, non-distended MUSCULOSKELETAL:  No edema; No deformity  SKIN: Warm and dry NEUROLOGIC:  Alert and oriented x 3 PSYCHIATRIC:  Normal affect   ASSESSMENT:    1. Essential hypertension   2. Hypertension not at goal   3. Multiple joint pain   4. Dyspnea on exertion    PLAN:    In order of problems listed above:  1. 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  He takes the medication including diuretic and in spite of that his blood pressure is not well controlled.  He does not feel well on clonidine.  He said it make him sleepy he takes clonidine in the midday and then at evening time.  I will ask him to continue present medication for now.  Chem-7 will be repeated today, if Chem-7 is fine then will add minoxidil and try to cut down clonidine.  We will also look for secondary hypertension.  He is already scheduled to have renal duplex which will be very appropriate.  Also a part of evaluation will include echocardiogram which tells me about left ventricle hypertrophy.  Surprisingly looking at his EKG he does not meet criteria for LVH.  Having such uncontrolled hypertension for so long should bleed.  But still I do not see it.  Again echocardiogram will be more appropriate to assess that.  Will wait for it.  We did talk about  need to exercise on the regular basis we did talk about avoidance of salt he is very much aware of it and he is doing it.  Another potential secondary cause of hypertension with his sleep apnea.  He tells me that he snores I will schedule him to have sleep study to rule out this problem. 2. Gout that being followed by internal medicine team. 3. Dyspnea on exertion only mild.  Again echocardiogram will be done anticipate to see some diastolic dysfunction. 4. Dyslipidemia: Last fasting lipid profile I have is from May 2019 with a LDL of 139 HDL 42.  This is something that we will elaborate on during the next visit.  I would like to see him rather quickly next 2 weeks I will be in [place].  Therefore, we agreed to do virtual visit in 2 weeks.   Medication Adjustments/Labs and Tests Ordered: Current medicines are reviewed at length with the patient today.  Concerns regarding medicines are outlined above.  No orders of the defined types were placed in this encounter.  No orders of the defined types were placed in this encounter.   Signed, Park Liter, MD, [facility]. 02/10/2020 3:27 PM    [place]

## 2020-02-11 LAB — BASIC METABOLIC PANEL
BUN/Creatinine Ratio: 15 (ref 10–24)
BUN: 19 mg/dL (ref 8–27)
CO2: 26 mmol/L (ref 20–29)
Calcium: 9.8 mg/dL (ref 8.6–10.2)
Chloride: 100 mmol/L (ref 96–106)
Creatinine, Ser: 1.31 mg/dL — ABNORMAL HIGH (ref 0.76–1.27)
GFR calc Af Amer: 65 mL/min/{1.73_m2} (ref >59)
GFR calc non Af Amer: 56 mL/min/{1.73_m2} — ABNORMAL LOW (ref >59)
Glucose: 111 mg/dL — ABNORMAL HIGH (ref 65–99)
Potassium: 4.3 mmol/L (ref 3.5–5.2)
Sodium: 143 mmol/L (ref 134–144)

## 2020-02-14 ENCOUNTER — Telehealth: Payer: Self-pay | Admitting: Emergency Medicine

## 2020-02-14 ENCOUNTER — Ambulatory Visit (HOSPITAL_BASED_OUTPATIENT_CLINIC_OR_DEPARTMENT_OTHER)
Admission: RE | Admit: 2020-02-14 | Discharge: 2020-02-14 | Disposition: A | Discharge status: Home / Self Care | Payer: Medicare HMO | Source: Ambulatory Visit | Attending: Family Medicine | Admitting: Family Medicine

## 2020-02-14 ENCOUNTER — Other Ambulatory Visit: Payer: Self-pay

## 2020-02-14 DIAGNOSIS — K551 Chronic vascular disorders of intestine: Secondary | ICD-10-CM

## 2020-02-14 DIAGNOSIS — R600 Localized edema: Secondary | ICD-10-CM | POA: Diagnosis not present

## 2020-02-14 DIAGNOSIS — I10 Essential (primary) hypertension: Secondary | ICD-10-CM | POA: Insufficient documentation

## 2020-02-14 HISTORY — DX: Chronic vascular disorders of intestine: K55.1

## 2020-02-14 NOTE — Progress Notes (Signed)
  Echocardiogram 2D Echocardiogram has been performed.  Thomas [name] 02/14/2020, 9:45 AM

## 2020-02-14 NOTE — Telephone Encounter (Signed)
Patient was returning a call from [facility] about test results

## 2020-02-14 NOTE — Progress Notes (Signed)
  Echocardiogram 2D Echocardiogram has been performed.  Thomas [name] 02/14/2020, 9:46 AM

## 2020-02-14 NOTE — Telephone Encounter (Signed)
Left message to return call regarding results.

## 2020-02-15 ENCOUNTER — Telehealth: Payer: Self-pay | Admitting: *Deleted

## 2020-02-15 NOTE — Telephone Encounter (Signed)
PA for in lab split night sleep study submitted to [facility] via web portal.

## 2020-02-16 ENCOUNTER — Other Ambulatory Visit: Payer: Self-pay | Admitting: Family Medicine

## 2020-02-16 DIAGNOSIS — K551 Chronic vascular disorders of intestine: Secondary | ICD-10-CM

## 2020-02-16 MED ORDER — MINOXIDIL 2.5 MG PO TABS: 5.0000 mg | ORAL_TABLET | Freq: Every day | ORAL | 2 refills | Status: DC

## 2020-02-16 MED ORDER — CLONIDINE HCL 0.1 MG PO TABS: 0.1000 mg | ORAL_TABLET | Freq: Two times a day (BID) | ORAL | 1 refills | Status: DC

## 2020-02-16 NOTE — Telephone Encounter (Signed)
Left message for patient to return call.

## 2020-02-16 NOTE — Telephone Encounter (Signed)
Patient called back. He was informed that per his lab results Dr. Agustin Cree would like to start him on minoxidil 5 mg daily and decrease his clonidine to 0.1 mg twice daily. He was also instructed to come have his lab work in 1 week for a bmp. He verbally understood. No further questions.

## 2020-02-16 NOTE — Addendum Note (Signed)
Addended by: Ashok Norris on: 02/16/2020 04:03 PM   Modules accepted: Orders

## 2020-02-16 NOTE — Telephone Encounter (Signed)
Patient was calling back to discuss test results

## 2020-02-20 ENCOUNTER — Other Ambulatory Visit: Payer: Self-pay | Admitting: Internal Medicine

## 2020-02-23 ENCOUNTER — Other Ambulatory Visit: Payer: Self-pay

## 2020-02-24 ENCOUNTER — Telehealth: Payer: Self-pay | Admitting: Emergency Medicine

## 2020-02-24 ENCOUNTER — Telehealth (INDEPENDENT_AMBULATORY_CARE_PROVIDER_SITE_OTHER): Payer: Medicare HMO | Admitting: Cardiology

## 2020-02-24 ENCOUNTER — Encounter: Payer: Self-pay | Admitting: Cardiology

## 2020-02-24 ENCOUNTER — Other Ambulatory Visit: Payer: Self-pay

## 2020-02-24 VITALS — BP 150/78 | HR 98 | Ht 71.0 in | Wt 212.0 lb

## 2020-02-24 DIAGNOSIS — I10 Essential (primary) hypertension: Secondary | ICD-10-CM

## 2020-02-24 DIAGNOSIS — K551 Chronic vascular disorders of intestine: Secondary | ICD-10-CM

## 2020-02-24 DIAGNOSIS — R0600 Dyspnea, unspecified: Secondary | ICD-10-CM

## 2020-02-24 DIAGNOSIS — R0609 Other forms of dyspnea: Secondary | ICD-10-CM

## 2020-02-24 HISTORY — DX: Chronic vascular disorders of intestine: K55.1

## 2020-02-24 NOTE — Telephone Encounter (Signed)
Left message for patient to return call for virtual visit today.

## 2020-02-24 NOTE — Patient Instructions (Signed)
Medication Instructions:  Your physician recommends that you continue on your current medications as directed. Please refer to the Current Medication list given to you today.  *If you need a refill on your cardiac medications before your next appointment, please call your pharmacy*   Lab Work: Your physician recommends that you return for lab work on Monday: bmp   If you have labs (blood work) drawn today and your tests are completely normal, you will receive your results only by: Marland Kitchen MyChart Message (if you have MyChart) OR . A paper copy in the mail If you have any lab test that is abnormal or we need to change your treatment, we will call you to review the results.   Testing/Procedures: None.    Follow-Up: At [facility], you and your health needs are our priority.  As part of our continuing mission to provide you with exceptional heart care, we have created designated Provider Care Teams.  These Care Teams include your primary Cardiologist (physician) and Advanced Practice Providers (APPs -  Physician Assistants and Nurse Practitioners) who all work together to provide you with the care you need, when you need it.  We recommend signing up for the patient portal called "MyChart".  Sign up information is provided on this After Visit Summary.  MyChart is used to connect with patients for Virtual Visits (Telemedicine).  Patients are able to view lab/test results, encounter notes, upcoming appointments, etc.  Non-urgent messages can be sent to your provider as well.   To learn more about what you can do with MyChart, go to [email or web address].    Your next appointment:   1 month(s)  The format for your next appointment:   In Person  Provider:   Jenne Campus, MD   Other Instructions

## 2020-02-24 NOTE — Progress Notes (Signed)
Cardiology Office Note:    Date:  02/24/2020   ID:  Thomas [name], DOB September 20, 1951, MRN [id number]  PCP:  Thomas [name], Thomas Apa, DO  Cardiologist:  Jenne Campus, MD    Referring MD: Thomas [name], Thomas [name], *   No chief complaint on file. M doing fine  History of Present Illness:    Thomas [name] is a 69 y.o. male with hypertension which seems to be difficult to control.  He is on a lot of medication we tried to put him on right constellation of medications.  He did have some testing done so far which include renal artery ultrasounds which showed no significant renal artery stenosis, there is a lot of laboratory studies still pending.  I initiated him with minoxidil very small dose only 5 mg.  We will recheck his Chem-7, if Chem-7 is normal will increase dose of minoxidil gradually try to increase dose of minoxidil and get rid of either clonidine or may be at least decrease the dose since it make him feel weak and tired.  Past Medical History:  Diagnosis Date  . Avulsed toenail, initial encounter 05/04/2018  . Chronic gout    multiple sites--- followed by pcp--- (04-14-2018  per pt last flare-up , march 2019)  . Dyspnea on exertion 02/10/2020  . ED (erectile dysfunction)   . Epidermoid cyst of neck 10/16/2017  . Essential hypertension 07/21/2017  . Gout 01/19/2014  . Gout of multiple sites 07/21/2017  . History of kidney stones   . History of squamous cell carcinoma excision   . Hyperlipidemia   . Hypertension   . Hypertension not at goal 01/19/2014  . Left ureteral calculus   . Lower abdominal pain 08/27/2015  . Multiple joint pain 07/16/2018  . Pain in joint, shoulder region 12/11/2015  . Preventative health care 11/14/2016  . Urgency of urination   . URI (upper respiratory infection) 11/08/2014  . Wears glasses     Past Surgical History:  Procedure Laterality Date  . COLONOSCOPY WITH PROPOFOL  2017  . CYSTOSCOPY/RETROGRADE/URETEROSCOPY/STONE EXTRACTION WITH BASKET Left  04/16/2018   Procedure: CYSTOSCOPY/RETROGRADE/URETEROSCOPY/STONE EXTRACTION WITH BASKET/ HOLMIUM LASER LITHOTRIPSY/ STENT PLACEMENT;  Surgeon: Ardis Hughs, MD;  Location: [facility];  Service: Urology;  Laterality: Left;  . HOLMIUM LASER APPLICATION Left 3/84/6659   Procedure: HOLMIUM LASER APPLICATION;  Surgeon: Ardis Hughs, MD;  Location: [facility];  Service: Urology;  Laterality: Left;    Current Medications: Current Meds  Medication Sig  . b complex vitamins tablet Take 1 tablet by mouth daily.  . cloNIDine (CATAPRES) 0.1 MG tablet Take 1 tablet (0.1 mg total) by mouth 2 (two) times daily.  . colchicine 0.6 MG tablet Take 0.6 mg by mouth 2 (two) times daily.  Marland Kitchen FINACEA 15 % cream Use small amount bid  . hydrochlorothiazide (HYDRODIURIL) 25 MG tablet Take 1 tablet (25 mg total) by mouth daily.  Marland Kitchen ibuprofen (ADVIL) 100 MG tablet Take 100 mg by mouth 3 (three) times daily as needed for pain.  Marland Kitchen MAGNESIUM PO Take 1 tablet by mouth daily.  . minoxidil (LONITEN) 2.5 MG tablet Take 2 tablets (5 mg total) by mouth daily.  . traMADol (ULTRAM) 50 MG tablet Take 1 tablet (50 mg total) by mouth every 8 (eight) hours as needed.  . valsartan (DIOVAN) 320 MG tablet TAKE 1 TABLET BY MOUTH EVERY DAY     Allergies:   Patient has no known allergies.   Social History  Socioeconomic History  . Marital status: Married    Spouse name: Not on file  . Number of children: Not on file  . Years of education: Not on file  . Highest education level: Not on file  Occupational History  . Occupation: Pharmacist, hospital  Tobacco Use  . Smoking status: Former Smoker    Packs/day: 0.30    Years: 20.00    Pack years: 6.00    Types: Cigarettes    Quit date: 10/17/1994    Years since quitting: 25.3  . Smokeless tobacco: Never Used  Substance and Sexual Activity  . Alcohol use: Yes    Alcohol/week: 2.0 standard drinks    Types: 2 Glasses of wine per week  . Drug use: No   . Sexual activity: Yes  Other Topics Concern  . Not on file  Social History Narrative   Exercise 1 hour a day ---3-4 days a week   Social Determinants of Health   Financial Resource Strain:   . Difficulty of Paying Living Expenses:   Food Insecurity:   . Worried About Charity fundraiser in the Last Year:   . Arboriculturist in the Last Year:   Transportation Needs:   . Film/video editor (Medical):   Marland Kitchen Lack of Transportation (Non-Medical):   Physical Activity:   . Days of Exercise per Week:   . Minutes of Exercise per Session:   Stress:   . Feeling of Stress :   Social Connections:   . Frequency of Communication with Friends and Family:   . Frequency of Social Gatherings with Friends and Family:   . Attends Religious Services:   . Active Member of Clubs or Organizations:   . Attends Archivist Meetings:   Marland Kitchen Marital Status:      Family History: The patient's family history includes Alcohol abuse in his mother; Cancer in his sister; Depression in his mother; Heart disease (age of onset: 70) in his mother; Hypertension in his father. There is no history of Colon cancer, Pancreatic cancer, Rectal cancer, or Stomach cancer. ROS:   Please see the history of present illness.    All 14 point review of systems negative except as described per history of present illness  EKGs/Labs/Other Studies Reviewed:      Recent Labs: 01/25/2020: Hemoglobin 15.7; Platelets 269.0 02/10/2020: BUN 19; Creatinine, Ser 1.31; Potassium 4.3; Sodium 143  Recent Lipid Panel    Component Value Date/Time   CHOL 209 (H) 05/04/2018 1104   TRIG 135.0 05/04/2018 1104   HDL 42.80 05/04/2018 1104   CHOLHDL 5 05/04/2018 1104   VLDL 27.0 05/04/2018 1104   LDLCALC 139 (H) 05/04/2018 1104    Physical Exam:    VS:  BP (!) 150/78   Pulse 98   Ht 5\' 11"  (1.803 m)   Wt 212 lb (96.2 kg)   BMI 29.57 kg/m     Wt Readings from Last 3 Encounters:  02/24/20 212 lb (96.2 kg)  02/10/20 212 lb  (96.2 kg)  02/02/20 215 lb (97.5 kg)     GEN:  Well nourished, well developed in no acute distress HEENT: Normal NECK: No JVD; No carotid bruits LYMPHATICS: No lymphadenopathy CARDIAC: RRR, no murmurs, no rubs, no gallops RESPIRATORY:  Clear to auscultation without rales, wheezing or rhonchi  ABDOMEN: Soft, non-tender, non-distended MUSCULOSKELETAL:  No edema; No deformity  SKIN: Warm and dry LOWER EXTREMITIES: no swelling NEUROLOGIC:  Alert and oriented x 3 PSYCHIATRIC:  Normal affect  ASSESSMENT:    1. Essential hypertension   2. Dyspnea on exertion   3. Superior mesenteric artery stenosis (HCC)    PLAN:    In order of problems listed above:  1. Essential hypertension + outlined above.  Awaiting a Chem-7 which will be done Monday and then will decrease dose of minoxidil probably from 5 to 10 mg. 2. Dyspnea on exertion echocardiogram showed normal left ventricular ejection fraction, there is mild left ventricle hypertrophy.  No significant valvular pathology.  Only mild mitral regurgitation. 3. Superior mesenteric artery stenosis but patient is absolutely asymptomatic.  In my opinion there is no need to intervene.  Today he was referred to vascular specialist.   Medication Adjustments/Labs and Tests Ordered: Current medicines are reviewed at length with the patient today.  Concerns regarding medicines are outlined above.  No orders of the defined types were placed in this encounter.  Medication changes: No orders of the defined types were placed in this encounter.   Signed, Park Liter, MD, [facility] 02/24/2020 2:14 PM    [place]

## 2020-03-13 ENCOUNTER — Other Ambulatory Visit: Payer: Self-pay | Admitting: Family Medicine

## 2020-03-13 ENCOUNTER — Other Ambulatory Visit: Payer: Self-pay | Admitting: Cardiology

## 2020-03-13 DIAGNOSIS — I10 Essential (primary) hypertension: Secondary | ICD-10-CM

## 2020-03-16 ENCOUNTER — Other Ambulatory Visit: Payer: Self-pay | Admitting: Family Medicine

## 2020-03-19 ENCOUNTER — Ambulatory Visit: Payer: Medicare HMO | Admitting: Family Medicine

## 2020-03-19 ENCOUNTER — Other Ambulatory Visit: Payer: Self-pay

## 2020-03-19 VITALS — BP 160/78 | Ht 70.0 in | Wt 215.0 lb

## 2020-03-19 DIAGNOSIS — M7742 Metatarsalgia, left foot: Secondary | ICD-10-CM | POA: Diagnosis not present

## 2020-03-19 DIAGNOSIS — M7632 Iliotibial band syndrome, left leg: Secondary | ICD-10-CM

## 2020-03-19 DIAGNOSIS — M79671 Pain in right foot: Secondary | ICD-10-CM | POA: Diagnosis not present

## 2020-03-19 DIAGNOSIS — M7741 Metatarsalgia, right foot: Secondary | ICD-10-CM

## 2020-03-19 DIAGNOSIS — M79672 Pain in left foot: Secondary | ICD-10-CM

## 2020-03-19 HISTORY — DX: Iliotibial band syndrome, left leg: M76.32

## 2020-03-19 HISTORY — DX: Metatarsalgia, right foot: M77.41

## 2020-03-19 NOTE — Assessment & Plan Note (Signed)
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

## 2020-03-19 NOTE — Patient Instructions (Addendum)
Get x-rays of your feet tomorrow. Try the sports insoles with metatarsal pads for your foot pain. Tylenol, aleve as needed for pain.  You strained your gluteus medius and IT band. Do home exercises and stretches daily as directed until pain has resolved. Icing 15 minutes at a time 3-4 times a day. Consider physical therapy (call me in a week if you're not improving and want to go ahead with this). Follow up with me in 1 month.

## 2020-03-19 NOTE — Progress Notes (Addendum)
    SUBJECTIVE:   CHIEF COMPLAINT / HPI:    Left hip pain Thomas [name] is a 69 y.o. male who presents with 2 days of left hip pain.  Patient was walking down the steps where he overall rotated his hip when stepping down.  This caused him to have a very sharp pain of the lateral left hip.  The pain was so strong that he could no longer stand so he went down on his knees.  The patient has been taking tramadol and ibuprofen as needed for pain.  The patient says that as he is try to compensate for his hip pain is caused left lateral knee pain.  Patient also endorses chronic hip "soreness" when walking.    He also has chronic pain syndrome of his feet bilaterally.  He takes off the shoes and shows me that they do wear down on the lateral aspects of the shoe.  He is concerned that he has an underlying gait issue causing his overall chronic pain.  He has not had any surgeries of the knees or hips or lower back.  He denies back pain, weakness of lower extremities, fevers, chills, urinary incontinence.   PERTINENT  PMH / PSH: As above  OBJECTIVE:   BP (!) 160/78   Ht 5\' 10"  (1.778 m)   Wt 215 lb (97.5 kg)   BMI 30.85 kg/m    General: No acute distress Left hip Inspection: No deformity, swelling, bruising of hip or back. Palpation:TTP over left lateral hip over the trochanter and just proximal to this. ROM: Normal range of motion Strength: Strength intact with minimal pain hip abduction. Stability: Joint stable Special tests: Negative FABER FADIR, logroll, piriformis stretch.  Bilateral feet Inspection: No swelling or effusion Palpation:Minimal tenderness of plantar forefoot. ROM: Full range of motion of ankle, normal plantar and dorsiflexion of the ankle, normal flexion of digits. Strength: Normal all motions of ankle. Stability: Negative ant drawer and talar tilt of ankles. 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  He has bony erosions of metatarsal heads, 1st MTP joints.  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  He has seen rheumatology before - will refer back to them to reevaluate, see if he needs additional workup for other rheumatologic conditions before starting allopurinol in addition to his colchicine.  Of note he is on HCTZ which can increase uric acid levels also - to discuss change in management with cardiology.  He gets flares about every other month.

## 2020-03-19 NOTE — Assessment & Plan Note (Signed)
As evidenced by trochanteric and left lateral knee pain -Home exercises -Naproxen and tramadol as needed -Follow-up 1 month if no improvement

## 2020-03-20 ENCOUNTER — Ambulatory Visit (INDEPENDENT_AMBULATORY_CARE_PROVIDER_SITE_OTHER): Payer: Medicare HMO | Admitting: Vascular Surgery

## 2020-03-20 ENCOUNTER — Encounter: Payer: Self-pay | Admitting: Family Medicine

## 2020-03-20 ENCOUNTER — Encounter: Payer: Self-pay | Admitting: Vascular Surgery

## 2020-03-20 ENCOUNTER — Ambulatory Visit (HOSPITAL_BASED_OUTPATIENT_CLINIC_OR_DEPARTMENT_OTHER)
Admission: RE | Admit: 2020-03-20 | Discharge: 2020-03-20 | Disposition: A | Discharge status: Home / Self Care | Payer: Medicare HMO | Source: Ambulatory Visit | Attending: Family Medicine | Admitting: Family Medicine

## 2020-03-20 ENCOUNTER — Other Ambulatory Visit (INDEPENDENT_AMBULATORY_CARE_PROVIDER_SITE_OTHER): Payer: Medicare HMO

## 2020-03-20 ENCOUNTER — Other Ambulatory Visit: Payer: Self-pay | Admitting: Family Medicine

## 2020-03-20 VITALS — BP 147/81 | HR 88 | Temp 98.0°F | Resp 20 | Ht 70.0 in | Wt 213.0 lb

## 2020-03-20 DIAGNOSIS — K551 Chronic vascular disorders of intestine: Secondary | ICD-10-CM

## 2020-03-20 DIAGNOSIS — M2550 Pain in unspecified joint: Secondary | ICD-10-CM

## 2020-03-20 DIAGNOSIS — M19072 Primary osteoarthritis, left ankle and foot: Secondary | ICD-10-CM | POA: Diagnosis not present

## 2020-03-20 DIAGNOSIS — M79671 Pain in right foot: Secondary | ICD-10-CM | POA: Insufficient documentation

## 2020-03-20 DIAGNOSIS — M10079 Idiopathic gout, unspecified ankle and foot: Secondary | ICD-10-CM

## 2020-03-20 DIAGNOSIS — M79672 Pain in left foot: Secondary | ICD-10-CM | POA: Diagnosis present

## 2020-03-20 DIAGNOSIS — M7731 Calcaneal spur, right foot: Secondary | ICD-10-CM | POA: Diagnosis not present

## 2020-03-20 DIAGNOSIS — Z79899 Other long term (current) drug therapy: Secondary | ICD-10-CM

## 2020-03-20 DIAGNOSIS — M19071 Primary osteoarthritis, right ankle and foot: Secondary | ICD-10-CM | POA: Diagnosis not present

## 2020-03-20 DIAGNOSIS — I10 Essential (primary) hypertension: Secondary | ICD-10-CM | POA: Diagnosis not present

## 2020-03-20 MED ORDER — TRAMADOL HCL 50 MG PO TABS: 50.0000 mg | ORAL_TABLET | Freq: Three times a day (TID) | ORAL | 0 refills | Status: DC | PRN

## 2020-03-20 NOTE — Progress Notes (Signed)
[name] MRN: [id number] DOB: 1951-06-07 Sex: male  REASON FOR CONSULT: Evaluate for SMA stenosis  HPI: Thomas [name] is a 69 y.o. male, with history of hypertension, hyperlipidemia, gout that presents for evaluation of SMA stenosis.  Patient had a renal duplex for hypertension.  Ultimately this showed a normal celiac artery but there was evidence of a 70-99% SMA stenosis at the origin with a velocity of 302.  Patient denies any abdominal pain.  No postprandial pain.  No weight loss.  States he looks forward to eating.  Has had no other abdominal surgeries.  States quit smoking years ago.  He is also concerned about pain in his feet states he gets swelling on the balls of his feet and has been seeing sports medicine and looking for answers.  Past Medical History:  Diagnosis Date  . Avulsed toenail, initial encounter 05/04/2018  . Chronic gout    multiple sites--- followed by pcp--- (04-14-2018  per pt last flare-up , march 2019)  . Dyspnea on exertion 02/10/2020  . ED (erectile dysfunction)   . Epidermoid cyst of neck 10/16/2017  . Essential hypertension 07/21/2017  . Gout 01/19/2014  . Gout of multiple sites 07/21/2017  . History of kidney stones   . History of squamous cell carcinoma excision   . Hyperlipidemia   . Hypertension   . Hypertension not at goal 01/19/2014  . Left ureteral calculus   . Lower abdominal pain 08/27/2015  . Multiple joint pain 07/16/2018  . Pain in joint, shoulder region 12/11/2015  . Preventative health care 11/14/2016  . Urgency of urination   . URI (upper respiratory infection) 11/08/2014  . Wears glasses     Past Surgical History:  Procedure Laterality Date  . COLONOSCOPY WITH PROPOFOL  2017  . CYSTOSCOPY/RETROGRADE/URETEROSCOPY/STONE EXTRACTION WITH BASKET Left 04/16/2018   Procedure: CYSTOSCOPY/RETROGRADE/URETEROSCOPY/STONE EXTRACTION WITH BASKET/ HOLMIUM LASER LITHOTRIPSY/ STENT PLACEMENT;  Surgeon: Ardis Hughs, MD;  Location:  [facility];  Service: Urology;  Laterality: Left;  . HOLMIUM LASER APPLICATION Left 9/37/9024   Procedure: HOLMIUM LASER APPLICATION;  Surgeon: Ardis Hughs, MD;  Location: [facility];  Service: Urology;  Laterality: Left;    Family History  Problem Relation Age of Onset  . Heart disease Mother 81  . Alcohol abuse Mother   . Depression Mother   . Cancer Sister        hodgkins, breast  . Hypertension Father   . Colon cancer Neg Hx   . Pancreatic cancer Neg Hx   . Rectal cancer Neg Hx   . Stomach cancer Neg Hx     SOCIAL HISTORY: Social History   Socioeconomic History  . Marital status: Married    Spouse name: Not on file  . Number of children: Not on file  . Years of education: Not on file  . Highest education level: Not on file  Occupational History  . Occupation: Pharmacist, hospital  Tobacco Use  . Smoking status: Former Smoker    Packs/day: 0.30    Years: 20.00    Pack years: 6.00    Types: Cigarettes    Quit date: 10/17/1994    Years since quitting: 25.4  . Smokeless tobacco: Never Used  Substance and Sexual Activity  . Alcohol use: Yes    Alcohol/week: 2.0 standard drinks    Types: 2 Glasses of wine per week  . Drug use: No  . Sexual activity: Yes  Other Topics Concern  .  Not on file  Social History Narrative   Exercise 1 hour a day ---3-4 days a week   Social Determinants of Health   Financial Resource Strain:   . Difficulty of Paying Living Expenses:   Food Insecurity:   . Worried About Charity fundraiser in the Last Year:   . Arboriculturist in the Last Year:   Transportation Needs:   . Film/video editor (Medical):   Marland Kitchen Lack of Transportation (Non-Medical):   Physical Activity:   . Days of Exercise per Week:   . Minutes of Exercise per Session:   Stress:   . Feeling of Stress :   Social Connections:   . Frequency of Communication with Friends and Family:   . Frequency of Social Gatherings with Friends and Family:    . Attends Religious Services:   . Active Member of Clubs or Organizations:   . Attends Archivist Meetings:   Marland Kitchen Marital Status:   Intimate Partner Violence:   . Fear of Current or Ex-Partner:   . Emotionally Abused:   Marland Kitchen Physically Abused:   . Sexually Abused:     No Known Allergies  Current Outpatient Medications  Medication Sig Dispense Refill  . b complex vitamins tablet Take 1 tablet by mouth daily.    . cloNIDine (CATAPRES) 0.1 MG tablet Take 1 tablet (0.1 mg total) by mouth 2 (two) times daily. 60 tablet 1  . colchicine 0.6 MG tablet Take 0.6 mg by mouth 2 (two) times daily.    Marland Kitchen FINACEA 15 % cream Use small amount bid 30 g 5  . hydrochlorothiazide (HYDRODIURIL) 25 MG tablet TAKE 1 TABLET BY MOUTH EVERY DAY 30 tablet 5  . ibuprofen (ADVIL) 100 MG tablet Take 100 mg by mouth 3 (three) times daily as needed for pain.    Marland Kitchen MAGNESIUM PO Take 1 tablet by mouth daily.    . minoxidil (LONITEN) 2.5 MG tablet Take 2 tablets (5 mg total) by mouth daily. 60 tablet 2  . traMADol (ULTRAM) 50 MG tablet Take 1 tablet (50 mg total) by mouth every 8 (eight) hours as needed. 45 tablet 0  . valsartan (DIOVAN) 320 MG tablet TAKE 1 TABLET BY MOUTH EVERY DAY 90 tablet 0   No current facility-administered medications for this visit.    REVIEW OF SYSTEMS:  [X]  denotes positive finding, [ ]  denotes negative finding Cardiac  Comments:  Chest pain or chest pressure:    Shortness of breath upon exertion:    Short of breath when lying flat:    Irregular heart rhythm:        Vascular    Pain in calf, thigh, or hip brought on by ambulation:    Pain in feet at night that wakes you up from your sleep:  x   Blood clot in your veins:    Leg swelling:         Pulmonary    Oxygen at home:    Productive cough:     Wheezing:         Neurologic    Sudden weakness in arms or legs:     Sudden numbness in arms or legs:     Sudden onset of difficulty speaking or slurred speech:    Temporary  loss of vision in one eye:     Problems with dizziness:         Gastrointestinal    Blood in stool:     Vomited blood:  Genitourinary    Burning when urinating:     Blood in urine:        Psychiatric    Major depression:         Hematologic    Bleeding problems:    Problems with blood clotting too easily:        Skin    Rashes or ulcers:        Constitutional    Fever or chills:      PHYSICAL EXAM: Vitals:   03/20/20 0848  BP: (!) 147/81  Pulse: 88  Resp: 20  Temp: 98 F (36.7 C)  SpO2: 97%  Weight: 213 lb (96.6 kg)  Height: 5\' 10"  (7.116 m)    GENERAL: The patient is a well-nourished male, in no acute distress. The vital signs are documented above. CARDIAC: There is a regular rate and rhythm.  VASCULAR:  2+ palpable femoral pulses bilaterally 2+ palpable posterior tibial and dorsalis pedis pulses bilaterally PULMONARY: There is good air exchange bilaterally without wheezing or rales. ABDOMEN: Soft and non-tender with normal pitched bowel sounds.   No rebound or guarding. MUSCULOSKELETAL: There are no major deformities or cyanosis. NEUROLOGIC: No focal weakness or paresthesias are detected. SKIN: There are no ulcers or rashes noted. PSYCHIATRIC: The patient has a normal affect.  DATA:   Renal artery duplex shows high-grade SMA stenosis with a velocity of 302 at the SMA origin and normal celiac artery with velocity 77  Assessment/Plan:  69 year old male referred for evaluation of suspected high grade SMA stenosis.  Discussed with patient that this is incidental finding and he does not require any surgical intervention from our standpoint given he is completely asymptomatic.  He is not having any signs of mesenteric ischemia.  No postprandial pain and looks forward to eating weight is stable etc.  Celiac is widely patent as well.  I discussed concerning signs and symptoms to pay attention to in the future.  I offered him follow-up with surveillance versus  just calling us if he has a problem in the future and he will just let us know with as needed follow-up.   Marty Heck, MD [facility] Office: [place]

## 2020-03-20 NOTE — Telephone Encounter (Signed)
Requesting: Tramadol Contract: 03/20/2020 UDS: 03/20/2020 Last OV: 02/02/2020 Next OV: N/A  Last Refill: 02/09/20, #[id number] RF Database:   Please advise

## 2020-03-21 LAB — BASIC METABOLIC PANEL
BUN/Creatinine Ratio: 18 (ref 10–24)
BUN: 23 mg/dL (ref 8–27)
CO2: 25 mmol/L (ref 20–29)
Calcium: 9.6 mg/dL (ref 8.6–10.2)
Chloride: 100 mmol/L (ref 96–106)
Creatinine, Ser: 1.26 mg/dL (ref 0.76–1.27)
GFR calc Af Amer: 68 mL/min/{1.73_m2} (ref >59)
GFR calc non Af Amer: 59 mL/min/{1.73_m2} — ABNORMAL LOW (ref >59)
Glucose: 87 mg/dL (ref 65–99)
Potassium: 4.3 mmol/L (ref 3.5–5.2)
Sodium: 141 mmol/L (ref 134–144)

## 2020-03-22 ENCOUNTER — Encounter (HOSPITAL_COMMUNITY): Payer: Self-pay

## 2020-03-22 ENCOUNTER — Other Ambulatory Visit: Payer: Self-pay

## 2020-03-22 ENCOUNTER — Observation Stay (HOSPITAL_COMMUNITY): Payer: Medicare HMO

## 2020-03-22 ENCOUNTER — Emergency Department (HOSPITAL_COMMUNITY): Payer: Medicare HMO

## 2020-03-22 ENCOUNTER — Observation Stay (HOSPITAL_COMMUNITY)
Admission: EM | Admit: 2020-03-22 | Discharge: 2020-03-23 | Disposition: A | Discharge status: Home / Self Care | Payer: Medicare HMO | Attending: Internal Medicine | Admitting: Internal Medicine

## 2020-03-22 DIAGNOSIS — R7303 Prediabetes: Secondary | ICD-10-CM | POA: Diagnosis not present

## 2020-03-22 DIAGNOSIS — R0602 Shortness of breath: Secondary | ICD-10-CM | POA: Diagnosis not present

## 2020-03-22 DIAGNOSIS — Z20822 Contact with and (suspected) exposure to covid-19: Secondary | ICD-10-CM | POA: Insufficient documentation

## 2020-03-22 DIAGNOSIS — M79671 Pain in right foot: Secondary | ICD-10-CM

## 2020-03-22 DIAGNOSIS — Z87891 Personal history of nicotine dependence: Secondary | ICD-10-CM | POA: Insufficient documentation

## 2020-03-22 DIAGNOSIS — I6381 Other cerebral infarction due to occlusion or stenosis of small artery: Secondary | ICD-10-CM | POA: Diagnosis not present

## 2020-03-22 DIAGNOSIS — E041 Nontoxic single thyroid nodule: Secondary | ICD-10-CM | POA: Insufficient documentation

## 2020-03-22 DIAGNOSIS — R200 Anesthesia of skin: Secondary | ICD-10-CM | POA: Diagnosis present

## 2020-03-22 DIAGNOSIS — Z79899 Other long term (current) drug therapy: Secondary | ICD-10-CM | POA: Insufficient documentation

## 2020-03-22 DIAGNOSIS — E785 Hyperlipidemia, unspecified: Secondary | ICD-10-CM | POA: Diagnosis not present

## 2020-03-22 DIAGNOSIS — M109 Gout, unspecified: Secondary | ICD-10-CM | POA: Diagnosis present

## 2020-03-22 DIAGNOSIS — M79672 Pain in left foot: Secondary | ICD-10-CM

## 2020-03-22 DIAGNOSIS — M4802 Spinal stenosis, cervical region: Secondary | ICD-10-CM | POA: Diagnosis not present

## 2020-03-22 DIAGNOSIS — Z03818 Encounter for observation for suspected exposure to other biological agents ruled out: Secondary | ICD-10-CM | POA: Diagnosis not present

## 2020-03-22 DIAGNOSIS — I639 Cerebral infarction, unspecified: Secondary | ICD-10-CM

## 2020-03-22 DIAGNOSIS — I10 Essential (primary) hypertension: Secondary | ICD-10-CM | POA: Diagnosis not present

## 2020-03-22 HISTORY — DX: Cerebral infarction, unspecified: I63.9

## 2020-03-22 LAB — COMPREHENSIVE METABOLIC PANEL
ALT: 66 U/L — ABNORMAL HIGH (ref 0–44)
AST: 37 U/L (ref 15–41)
Albumin: 4.4 g/dL (ref 3.5–5.0)
Alkaline Phosphatase: 63 U/L (ref 38–126)
Anion gap: 10 (ref 5–15)
BUN: 23 mg/dL (ref 8–23)
CO2: 29 mmol/L (ref 22–32)
Calcium: 8.9 mg/dL (ref 8.9–10.3)
Chloride: 99 mmol/L (ref 98–111)
Creatinine, Ser: 1.15 mg/dL (ref 0.61–1.24)
GFR calc Af Amer: >60 mL/min (ref >60)
GFR calc non Af Amer: >60 mL/min (ref >60)
Glucose, Bld: 92 mg/dL (ref 70–99)
Potassium: 3.8 mmol/L (ref 3.5–5.1)
Sodium: 138 mmol/L (ref 135–145)
Total Bilirubin: 1 mg/dL (ref 0.3–1.2)
Total Protein: 7.2 g/dL (ref 6.5–8.1)

## 2020-03-22 LAB — DIFFERENTIAL
Abs Immature Granulocytes: 0.04 10*3/uL (ref 0.00–0.07)
Basophils Absolute: 0 10*3/uL (ref 0.0–0.1)
Basophils Relative: 1 %
Eosinophils Absolute: 0.1 10*3/uL (ref 0.0–0.5)
Eosinophils Relative: 1 %
Immature Granulocytes: 1 %
Lymphocytes Relative: 22 %
Lymphs Abs: 2 10*3/uL (ref 0.7–4.0)
Monocytes Absolute: 0.6 10*3/uL (ref 0.1–1.0)
Monocytes Relative: 7 %
Neutro Abs: 6.1 10*3/uL (ref 1.7–7.7)
Neutrophils Relative %: 68 %

## 2020-03-22 LAB — RAPID URINE DRUG SCREEN, HOSP PERFORMED
Amphetamines: NOT DETECTED
Barbiturates: NOT DETECTED
Benzodiazepines: NOT DETECTED
Cocaine: NOT DETECTED
Opiates: NOT DETECTED
Tetrahydrocannabinol: NOT DETECTED

## 2020-03-22 LAB — PROTIME-INR
INR: 0.9 (ref 0.8–1.2)
Prothrombin Time: 11.9 seconds (ref 11.4–15.2)

## 2020-03-22 LAB — CBC
HCT: 47.5 % (ref 39.0–52.0)
Hemoglobin: 15.8 g/dL (ref 13.0–17.0)
MCH: 29 pg (ref 26.0–34.0)
MCHC: 33.3 g/dL (ref 30.0–36.0)
MCV: 87.2 fL (ref 80.0–100.0)
Platelets: 225 10*3/uL (ref 150–400)
RBC: 5.45 MIL/uL (ref 4.22–5.81)
RDW: 12.9 % (ref 11.5–15.5)
WBC: 8.8 10*3/uL (ref 4.0–10.5)
nRBC: 0 % (ref 0.0–0.2)

## 2020-03-22 LAB — URINALYSIS, ROUTINE W REFLEX MICROSCOPIC
Bilirubin Urine: NEGATIVE
Glucose, UA: NEGATIVE mg/dL
Hgb urine dipstick: NEGATIVE
Ketones, ur: NEGATIVE mg/dL
Leukocytes,Ua: NEGATIVE
Nitrite: NEGATIVE
Protein, ur: NEGATIVE mg/dL
Specific Gravity, Urine: 1.013 (ref 1.005–1.030)
pH: 7 (ref 5.0–8.0)

## 2020-03-22 LAB — I-STAT CHEM 8, ED
BUN: 28 mg/dL — ABNORMAL HIGH (ref 8–23)
Calcium, Ion: 1.14 mmol/L — ABNORMAL LOW (ref 1.15–1.40)
Chloride: 98 mmol/L (ref 98–111)
Creatinine, Ser: 1.1 mg/dL (ref 0.61–1.24)
Glucose, Bld: 88 mg/dL (ref 70–99)
HCT: 48 % (ref 39.0–52.0)
Hemoglobin: 16.3 g/dL (ref 13.0–17.0)
Potassium: 4.2 mmol/L (ref 3.5–5.1)
Sodium: 140 mmol/L (ref 135–145)
TCO2: 32 mmol/L (ref 22–32)

## 2020-03-22 LAB — RESPIRATORY PANEL BY RT PCR (FLU A&B, COVID)
Influenza A by PCR: NEGATIVE
Influenza B by PCR: NEGATIVE
SARS Coronavirus 2 by RT PCR: NEGATIVE

## 2020-03-22 LAB — HEMOGLOBIN A1C
Hgb A1c MFr Bld: 6.3 % — ABNORMAL HIGH (ref 4.8–5.6)
Mean Plasma Glucose: 134.11 mg/dL

## 2020-03-22 LAB — APTT: aPTT: 27 seconds (ref 24–36)

## 2020-03-22 LAB — ETHANOL: Alcohol, Ethyl (B): <10 mg/dL (ref <10)

## 2020-03-22 LAB — HIV ANTIBODY (ROUTINE TESTING W REFLEX): HIV Screen 4th Generation wRfx: NONREACTIVE

## 2020-03-22 MED ORDER — ACETAMINOPHEN 325 MG PO TABS
650.0000 mg | ORAL_TABLET | ORAL | Status: DC | PRN
  Administered 2020-03-22 – 2020-03-23 (×2): 650 mg via ORAL
  Filled 2020-03-22 (×2): qty 2

## 2020-03-22 MED ORDER — SODIUM CHLORIDE 0.9 % IV SOLN: INTRAVENOUS | Status: DC

## 2020-03-22 MED ORDER — ACETAMINOPHEN 650 MG RE SUPP: 650.0000 mg | RECTAL | Status: DC | PRN

## 2020-03-22 MED ORDER — ASPIRIN 325 MG PO TABS
325.0000 mg | ORAL_TABLET | Freq: Every day | ORAL | Status: DC
  Administered 2020-03-22: 325 mg via ORAL
  Filled 2020-03-22: qty 1

## 2020-03-22 MED ORDER — ASPIRIN 300 MG RE SUPP: 300.0000 mg | Freq: Every day | RECTAL | Status: DC

## 2020-03-22 MED ORDER — TRAMADOL HCL 50 MG PO TABS
50.0000 mg | ORAL_TABLET | Freq: Three times a day (TID) | ORAL | Status: DC | PRN
  Administered 2020-03-22 – 2020-03-23 (×2): 50 mg via ORAL
  Filled 2020-03-22 (×2): qty 1

## 2020-03-22 MED ORDER — ACETAMINOPHEN 160 MG/5ML PO SOLN: 650.0000 mg | ORAL | Status: DC | PRN

## 2020-03-22 MED ORDER — B COMPLEX-C PO TABS
1.0000 | ORAL_TABLET | Freq: Every day | ORAL | Status: DC
  Administered 2020-03-23: 09:00:00 1 via ORAL
  Filled 2020-03-22: qty 1

## 2020-03-22 MED ORDER — SENNOSIDES-DOCUSATE SODIUM 8.6-50 MG PO TABS: 1.0000 | ORAL_TABLET | Freq: Every evening | ORAL | Status: DC | PRN

## 2020-03-22 MED ORDER — ENOXAPARIN SODIUM 40 MG/0.4ML ~~LOC~~ SOLN
40.0000 mg | SUBCUTANEOUS | Status: DC
  Administered 2020-03-22: 40 mg via SUBCUTANEOUS
  Filled 2020-03-22: qty 0.4

## 2020-03-22 MED ORDER — STROKE: EARLY STAGES OF RECOVERY BOOK
Freq: Once | Status: AC
  Filled 2020-03-22: qty 1

## 2020-03-22 MED ORDER — CLONIDINE HCL 0.1 MG PO TABS
0.1000 mg | ORAL_TABLET | Freq: Two times a day (BID) | ORAL | Status: DC
  Administered 2020-03-22: 0.1 mg via ORAL
  Filled 2020-03-22: qty 1

## 2020-03-22 NOTE — H&P (Addendum)
History and Physical    Thomas [name] UMP:[id number] DOB: [birth year] DOA: 03/22/2020  PCP: Ann Held, DO   I have briefly reviewed patients previous medical reports in [facility].  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  Patient saw a sports medicine MD yesterday due to left hip pain and was prescribed some therapies.  On 03/21/20 after completing the stretching exercises, patient started to experience right-sided numbness at around 4 PM.  This initially started in the right calf and then involved right half of the body below the neck.  He also reported mild numbness on the right cheek.  May have had questionable right-sided weakness but no difficulty in ambulation, gait or dexterity.  He denies any slurred speech, swallowing difficulty or facial asymmetry.  No headache, dizziness, lightheadedness, chest pain, dyspnea or palpitations.  Symptoms persisted through the course of the night.  This morning he discussed with his orthopedic MD thinking that the exercises may have precipitated his symptoms.  He was advised to proceed to the ED due to strokelike symptoms.  Patient was not on antiplatelets PTA.  He finished 2 doses of his Covid 19 Pfizer vaccine approximately 6 weeks prior.  He was seen by sports medicine on 5/3 for LT band syndrome, left and metatarsalgia of both feet.  He also saw vascular surgery for asymptomatic high-grade SMA stenosis-no interventions needed at this time.  ED Course: Afebrile, BP high in the 160s-180s/60s-80s, other vital signs stable.  CMP unremarkable except for ALT of 66, CBC normal, urine microscopy  unremarkable, blood alcohol level <10, UDS negative, COVID-19 PCR testing pending.  MRI of the brain shows acute lacunar infarct in the left thalamus.  Mild chronic small vessel ischemia in the cerebral white matter.  MRI of the cervical spine shows no acute osseous abnormality or abnormal cord signal.  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  Left thyroid lobe nodules measuring up to 12 mm, thyroid ultrasound recommended.  EDP has discussed with Dr. Cheral Marker, Neuro Hospitalist who recommends transferring patient to [facility] for further evaluation and management.  Patient has passed bedside RN stroke swallow screen and has tolerated regular diet.  Review of Systems:  All other systems reviewed and apart from HPI, are negative.  Past Medical History:  Diagnosis Date  . Avulsed toenail, initial encounter 05/04/2018  . Chronic gout    multiple sites--- followed by pcp--- (04-14-2018  per pt last flare-up , march 2019)  . Dyspnea on exertion 02/10/2020  . ED (erectile dysfunction)   . Epidermoid cyst of neck 10/16/2017  . Essential hypertension 07/21/2017  . Gout 01/19/2014  . Gout of multiple sites 07/21/2017  . History of kidney stones   . History of squamous cell carcinoma excision   . Hyperlipidemia   . Hypertension   . Hypertension not at goal 01/19/2014  . Left ureteral calculus   . Lower abdominal pain 08/27/2015  . Multiple joint pain 07/16/2018  . Pain in joint, shoulder region 12/11/2015  . Preventative health care 11/14/2016  . Urgency of urination   . URI (upper respiratory infection) 11/08/2014  . Wears glasses     Past Surgical History:  Procedure  Laterality Date  . COLONOSCOPY WITH PROPOFOL  2017  . CYSTOSCOPY/RETROGRADE/URETEROSCOPY/STONE EXTRACTION WITH BASKET Left 04/16/2018   Procedure: CYSTOSCOPY/RETROGRADE/URETEROSCOPY/STONE EXTRACTION WITH BASKET/ HOLMIUM LASER LITHOTRIPSY/ STENT PLACEMENT;  Surgeon: Ardis Hughs, MD;   Location: [facility];  Service: Urology;  Laterality: Left;  . HOLMIUM LASER APPLICATION Left 6/31/4970   Procedure: HOLMIUM LASER APPLICATION;  Surgeon: Ardis Hughs, MD;  Location: [facility];  Service: Urology;  Laterality: Left;    Social History  reports that he quit smoking about 25 years ago. His smoking use included cigarettes. He has a 6.00 pack-year smoking history. He has never used smokeless tobacco. He reports current alcohol use of about 2.0 standard drinks of alcohol per week. He reports that he does not use drugs.  No Known Allergies  Family History  Problem Relation Age of Onset  . Heart disease Mother 40  . Alcohol abuse Mother   . Depression Mother   . Cancer Sister        hodgkins, breast  . Hypertension Father   . Colon cancer Neg Hx   . Pancreatic cancer Neg Hx   . Rectal cancer Neg Hx   . Stomach cancer Neg Hx      Prior to Admission medications   Medication Sig Start Date End Date Taking? Authorizing Provider  b complex vitamins tablet Take 1 tablet by mouth daily.   Yes [provider]  cloNIDine (CATAPRES) 0.1 MG tablet Take 1 tablet (0.1 mg total) by mouth 2 (two) times daily. 02/16/20  Yes Park Liter, MD  hydrochlorothiazide (HYDRODIURIL) 25 MG tablet TAKE 1 TABLET BY MOUTH EVERY DAY Patient taking differently: Take 25 mg by mouth daily.  03/13/20  Yes Roma Schanz R, DO  minoxidil (LONITEN) 2.5 MG tablet Take 2 tablets (5 mg total) by mouth daily. Patient taking differently: Take 2.5 mg by mouth 2 (two) times daily.  02/16/20  Yes Park Liter, MD  traMADol (ULTRAM) 50 MG tablet Take 1 tablet (50 mg total) by mouth every 8 (eight) hours as needed. Patient taking differently: Take 50 mg by mouth every 8 (eight) hours as needed for moderate pain.  03/20/20  Yes Shelda Pal, DO  valsartan (DIOVAN) 320 MG tablet TAKE 1 TABLET BY MOUTH EVERY DAY Patient taking differently: Take 320  mg by mouth daily.  03/16/20  Yes Roma Schanz R, DO  colchicine 0.6 MG tablet Take 0.6 mg by mouth 2 (two) times daily.    [provider]    Physical Exam: Vitals:   03/22/20 1300 03/22/20 1400 03/22/20 1445 03/22/20 1500  BP: (!) 151/69 (!) 167/67 (!) 164/76 (!) 189/84  Pulse: 67 66 70 66  Resp: (!) 26 14 16 12   Temp:      TempSrc:      SpO2: 99% 98% 98% 96%      Constitutional: Pleasant middle-age male, moderately built and overweight lying comfortably propped up in bed without distress. Eyes: PERTLA, lids and conjunctivae normal ENMT: Mucous membranes are moist. Posterior pharynx clear of any exudate or lesions. Normal dentition.  Neck: supple, no masses, no thyromegaly Respiratory: Clear to auscultation without wheezing, rhonchi or crackles. No increased work of breathing. Cardiovascular: S1 & S2 heard, regular rate and rhythm. No JVD, murmurs, rubs or clicks. No pedal edema.  Telemetry personally reviewed: Sinus rhythm. Abdomen: Non distended. Non tender. Soft. No organomegaly or masses appreciated. No clinical Ascites. Normal bowel sounds heard. Musculoskeletal: no clubbing /  cyanosis. No joint deformity upper and lower extremities. Good ROM, no contractures. Normal muscle tone.  Skin: no rashes, lesions, ulcers. No induration Neurologic: CN 2-12 grossly intact. Sensation intact, DTR normal. Strength 5/5 in all 4 limbs.  Although patient has subjective right-sided numbness, no objective sensory deficits noted on my testing which was collaborated with EDP as well. Psychiatric: Normal judgment and insight. Alert and oriented x 3. Normal mood.     Labs on Admission: I have personally reviewed following labs and imaging studies  CBC: Recent Labs  Lab 03/22/20 1247 03/22/20 1259  WBC 8.8  --   NEUTROABS 6.1  --   HGB 15.8 16.3  HCT 47.5 48.0  MCV 87.2  --   PLT 225  --     Basic Metabolic Panel: Recent Labs  Lab 03/20/20 0951 03/22/20 1247  03/22/20 1259  NA 141 138 140  K 4.3 3.8 4.2  CL 100 99 98  CO2 25 29  --   GLUCOSE 87 92 88  BUN 23 23 28*  CREATININE 1.26 1.15 1.10  CALCIUM 9.6 8.9  --     Liver Function Tests: Recent Labs  Lab 03/22/20 1247  AST 37  ALT 66*  ALKPHOS 63  BILITOT 1.0  PROT 7.2  ALBUMIN 4.4    Urine analysis:    Component Value Date/Time   COLORURINE YELLOW 03/22/2020 1247   APPEARANCEUR HAZY (A) 03/22/2020 1247   LABSPEC 1.013 03/22/2020 1247   PHURINE 7.0 03/22/2020 1247   GLUCOSEU NEGATIVE 03/22/2020 1247   HGBUR NEGATIVE 03/22/2020 1247   BILIRUBINUR NEGATIVE 03/22/2020 1247   BILIRUBINUR neg 08/27/2015 1103   KETONESUR NEGATIVE 03/22/2020 1247   PROTEINUR NEGATIVE 03/22/2020 1247   UROBILINOGEN 0.2 08/27/2015 1103   NITRITE NEGATIVE 03/22/2020 1247   LEUKOCYTESUR NEGATIVE 03/22/2020 1247     Radiological Exams on Admission: MR Brain Wo Contrast (neuro protocol)  Result Date: 03/22/2020 CLINICAL DATA:  Right-sided M this beginning yesterday afternoon at 4 p.m. EXAM: MRI HEAD WITHOUT CONTRAST TECHNIQUE: Multiplanar, multiecho pulse sequences of the brain and surrounding structures were obtained without intravenous contrast. COMPARISON:  None. FINDINGS: Brain: 6 mm acute infarct in the left thalamus. Mild chronic small vessel ischemic type change in the cerebral white matter. Brain volume is normal. No hemorrhage, hydrocephalus, or masslike finding. No chronic blood products. Vascular: Normal flow voids Skull and upper cervical spine: Normal marrow signal Sinuses/Orbits: Negative IMPRESSION: 1. Acute lacunar infarct at the left thalamus. 2. Mild chronic small vessel ischemia in the cerebral white matter. Electronically Signed   By: Monte Fantasia M.D.   On: 03/22/2020 11:40   MR Cervical Spine Wo Contrast  Result Date: 03/22/2020 CLINICAL DATA:  Right-sided weakness EXAM: MRI CERVICAL SPINE WITHOUT CONTRAST TECHNIQUE: Multiplanar, multisequence MR imaging of the cervical spine  was performed. No intravenous contrast was administered. COMPARISON:  None. FINDINGS: Alignment: Mild straightening of the cervical lordosis without static listhesis. Vertebrae: No fracture, evidence of discitis, or bone lesion. Mild diffuse intrinsic canal narrowing on the basis of congenitally short pedicles. Cord: Normal signal and morphology. Posterior Fossa, vertebral arteries, paraspinal tissues: Two T2 hyperintense nodules within the left thyroid lobe, largest measuring up to 1.2 cm. Vertebral artery flow voids are preserved. Visualized posterior fossa is within normal limits. Disc levels: C2-C3: No disc protrusion. Mild right-sided facet arthropathy. No foraminal or canal stenosis. C3-C4: Small posterior disc osteophyte complex with mild bilateral facet and uncovertebral arthropathy result in mild-to-moderate canal stenosis with moderate bilateral foraminal stenosis.  C4-C5: Small posterior disc osteophyte complex and mild bilateral facet and uncovertebral arthropathy result in mild canal stenosis with moderate right and mild left foraminal stenosis. C5-C6: Small posterior disc osteophyte complex with left greater than right facet and uncovertebral arthropathy resulting in mild canal stenosis with moderate left and mild right foraminal stenosis. C6-C7: Small posterior disc osteophyte complex with right greater than left facet and uncovertebral arthropathy resulting in severe right and moderate left foraminal stenosis and mild canal stenosis. C7-T1: Small posterior disc osteophyte complex without foraminal or canal stenosis. IMPRESSION: 1. No acute osseous abnormality or abnormal cord signal. 2. Mild to moderate C3-4 and mild C4-5 through C6-7 canal stenosis. 3. Severe right C6-7 and moderate bilateral C3-4 and C4-5 foraminal stenosis. Multilevel mild foraminal stenosis. 4. Mild diffuse intrinsic cervical canal narrowing on the basis of congenitally short pedicles. 5. Left thyroid lobe nodules measuring up to  12 mm. Recommend thyroid US (ref: J Am Coll Radiol. 2015 Feb;12(2): 143-50). Electronically Signed   By: Davina Poke D.O.   On: 03/22/2020 12:18    EKG: None done this admission, will order.  Assessment/Plan Principal Problem:   Acute ischemic stroke [facility]) Active Problems:   Essential hypertension   Gout of multiple sites     Acute ischemic stroke: Resultant right-sided paresthesias.  MRI brain confirms acute lacunar infarct in the left thalamus.  Last known well approximately 4 PM on 03/21/2020.  Not on antiplatelets PTA.  Neurology consulted by EDP at [facility] and recommended transferring to [facility] for further evaluation and management.  Complete stroke work-up including carotid Dopplers, 2D echo, A1c, fasting lipids.  Will defer decision regarding MRA of the brain versus TCD's to neurology.  Passed bedside RN swallow screen and tolerating diet.  Therapies evaluation but suspect he will not need much.  Initiated aspirin 325 mg daily.  Essential hypertension: As per patient report and given polypharmacy, suspect difficult to control.  Allow permissive hypertension and treat if BP >220/120.  We will continue clonidine to avoid reflex tachycardia and rebound hypertension.  Will hold the other 3 antihypertensives (HCTZ, minoxidil and Diovan).  Patient reported borderline dyslipidemia: Follow-up fasting lipids and treat as needed.  Erosive gout: Remotely saw a rheumatologist but currently follows with PCP.  Outpatient management.  No acute flare at this time.  Colchicine on hold until reviewed by cardiology.  Cervical spine stenosis: Unsure if some of his right-sided numbness are related to this but probably more from stroke.  Await neurology input.  Consider outpatient neurosurgery consultation.  Left thyroid lobe nodules measuring up to 12 mm: Nonurgent thyroid ultrasound and any other evaluation as deemed necessary, can be conducted as outpatient.   DVT prophylaxis:  Lovenox Code Status: Full Family Communication: Discussed in detail with patient spouse at bedside, updated care and answered questions.  She was present in the room throughout my evaluation. Disposition Plan:   Patient is from:  Home  Anticipated DC to:  Home  Anticipated DC date:  03/23/2020  Anticipated DC barriers: Completing stroke evaluation and neurology consultation and improvement in symptoms.   Consults called: Neuro hospitalist-input pending Admission status: Telemetry/observation  Severity of Illness: The appropriate patient status for this patient is OBSERVATION. Observation status is judged to be reasonable and necessary in order to provide the required intensity of service to ensure the patient's safety. The patient's presenting symptoms, physical exam findings, and initial radiographic and laboratory data in the context of their medical condition is felt to place them  at decreased risk for further clinical deterioration. Furthermore, it is anticipated that the patient will be medically stable for discharge from the hospital within 2 midnights of admission. The following factors support the patient status of observation.   " The patient's presenting symptoms include right-sided numbness. " The physical exam findings include elevated blood pressures. " The initial radiographic and laboratory data are MRI confirmed stroke.      Vernell Leep MD [facility]  To contact the attending provider between 7A-7P or the covering provider during after hours 7P-7A, please log into the web site [email or web address] and access using universal [place] password for that web site. If you do not have the password, please call the hospital operator.  03/22/2020, 3:44 PM

## 2020-03-22 NOTE — ED Notes (Signed)
Pt transported to MRI at this time 

## 2020-03-22 NOTE — ED Triage Notes (Signed)
Pt arrives POV from home with complaints of right sided numbness beginning yesterday afternoon around 4pm. Pt reports he has PT for a right hip strain and pt reports after PT the numbness began. Pt endorses some weakness on right side. Pt ambulatory in triage. Pt A&O

## 2020-03-22 NOTE — Telephone Encounter (Signed)
I called patient and spoke to him about this (also went over his x-rays).  He describes numbness on right side of his body - mostly in right knee and calf but also in his right shoulder and upper arm without injury.  Started in the afternoon.  No speech, vision changes.  No weakness.  No numbness of torso.  Would be an unusual distribution for a CVA but with multiple limbs involved (and beyond 4 hour window for thrombolytics anyway) advised he call PCP for further recommendations.

## 2020-03-22 NOTE — ED Notes (Signed)
[facility] called. 

## 2020-03-22 NOTE — ED Provider Notes (Signed)
[facility] DEPT Provider Note   CSN: [id number] Arrival date & time: 03/22/20  7564     History Chief Complaint  Patient presents with  . Numbness    Thomas [name] is a 69 y.o. male.  69 year old male presents with right-sided paresthesias which began yesterday.  Patient states that he first noticed it during physical therapy with some numbness to his right lower extremity.  States he also had right upper extremity and right facial paresthesias as well.  Also notes paresthesias to the right chest and abdomen.  Denies any neck pain.  No headache.  No nausea.  Symptoms have been persistent.  Denies any left-sided symptoms.  No visual changes.  No treatment use prior to arrival.  No prior history of same.        Past Medical History:  Diagnosis Date  . Avulsed toenail, initial encounter 05/04/2018  . Chronic gout    multiple sites--- followed by pcp--- (04-14-2018  per pt last flare-up , march 2019)  . Dyspnea on exertion 02/10/2020  . ED (erectile dysfunction)   . Epidermoid cyst of neck 10/16/2017  . Essential hypertension 07/21/2017  . Gout 01/19/2014  . Gout of multiple sites 07/21/2017  . History of kidney stones   . History of squamous cell carcinoma excision   . Hyperlipidemia   . Hypertension   . Hypertension not at goal 01/19/2014  . Left ureteral calculus   . Lower abdominal pain 08/27/2015  . Multiple joint pain 07/16/2018  . Pain in joint, shoulder region 12/11/2015  . Preventative health care 11/14/2016  . Urgency of urination   . URI (upper respiratory infection) 11/08/2014  . Wears glasses     Patient Active Problem List   Diagnosis Date Noted  . It band syndrome, left 03/19/2020  . Metatarsalgia of both feet 03/19/2020  . 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 Dyspnea on exertion 02/10/2020  . Multiple joint pain 07/16/2018  . Avulsed toenail, initial encounter 05/04/2018  . Epidermoid cyst of neck 10/16/2017    . Essential hypertension 07/21/2017  . Gout of multiple sites 07/21/2017  . Preventative health care 11/14/2016  . Pain in joint, shoulder region 12/11/2015  . Lower abdominal pain 08/27/2015  . URI (upper respiratory infection) 11/08/2014  . Gout 01/19/2014  . Hypertension not at goal 01/19/2014    Past Surgical History:  Procedure Laterality Date  . COLONOSCOPY WITH PROPOFOL  2017  . CYSTOSCOPY/RETROGRADE/URETEROSCOPY/STONE EXTRACTION WITH BASKET Left 04/16/2018   Procedure: CYSTOSCOPY/RETROGRADE/URETEROSCOPY/STONE EXTRACTION WITH BASKET/ HOLMIUM LASER LITHOTRIPSY/ STENT PLACEMENT;  Surgeon: Ardis Hughs, MD;  Location: [facility];  Service: Urology;  Laterality: Left;  . HOLMIUM LASER APPLICATION Left 3/32/9518   Procedure: HOLMIUM LASER APPLICATION;  Surgeon: Ardis Hughs, MD;  Location: [facility];  Service: Urology;  Laterality: Left;       Family History  Problem Relation Age of Onset  . Heart disease Mother 38  . Alcohol abuse Mother   . Depression Mother   . Cancer Sister        hodgkins, breast  . Hypertension Father   . Colon cancer Neg Hx   . Pancreatic cancer Neg Hx   . Rectal cancer Neg Hx   . Stomach cancer Neg Hx     Social History   Tobacco Use  . Smoking status: Former Smoker    Packs/day: 0.30    Years: 20.00    Pack years: 6.00  Types: Cigarettes    Quit date: 10/17/1994    Years since quitting: 25.4  . Smokeless tobacco: Never Used  Substance Use Topics  . Alcohol use: Yes    Alcohol/week: 2.0 standard drinks    Types: 2 Glasses of wine per week    Comment: rare  . Drug use: No    Home Medications Prior to Admission medications   Medication Sig Start Date End Date Taking? Authorizing Provider  b complex vitamins tablet Take 1 tablet by mouth daily.   Yes [provider]  cloNIDine (CATAPRES) 0.1 MG tablet Take 1 tablet (0.1 mg total) by mouth 2 (two) times daily. 02/16/20  Yes  Park Liter, MD  hydrochlorothiazide (HYDRODIURIL) 25 MG tablet TAKE 1 TABLET BY MOUTH EVERY DAY Patient taking differently: Take 25 mg by mouth daily.  03/13/20  Yes Roma Schanz R, DO  minoxidil (LONITEN) 2.5 MG tablet Take 2 tablets (5 mg total) by mouth daily. Patient taking differently: Take 2.5 mg by mouth 2 (two) times daily.  02/16/20  Yes Park Liter, MD  traMADol (ULTRAM) 50 MG tablet Take 1 tablet (50 mg total) by mouth every 8 (eight) hours as needed. Patient taking differently: Take 50 mg by mouth every 8 (eight) hours as needed for moderate pain.  03/20/20  Yes Shelda Pal, DO  valsartan (DIOVAN) 320 MG tablet TAKE 1 TABLET BY MOUTH EVERY DAY Patient taking differently: Take 320 mg by mouth daily.  03/16/20  Yes Roma Schanz R, DO  colchicine 0.6 MG tablet Take 0.6 mg by mouth 2 (two) times daily.    [provider]  FINACEA 15 % cream Use small amount bid Patient not taking: Reported on 03/22/2020 07/26/19   Ann Held, DO    Allergies    Patient has no known allergies.  Review of Systems   Review of Systems  All other systems reviewed and are negative.   Physical Exam Updated Vital Signs BP (!) 183/74 Comment: HX of hypertension- just took BP meds  Pulse 79   Temp 98.9 F (37.2 C) (Oral)   Resp 18   SpO2 99%   Physical Exam Vitals and nursing note reviewed.  Constitutional:      General: He is not in acute distress.    Appearance: Normal appearance. He is well-developed. He is not toxic-appearing.  HENT:     Head: Normocephalic and atraumatic.  Eyes:     General: Lids are normal.     Conjunctiva/sclera: Conjunctivae normal.     Pupils: Pupils are equal, round, and reactive to light.  Neck:     Thyroid: No thyroid mass.     Trachea: No tracheal deviation.  Cardiovascular:     Rate and Rhythm: Normal rate and regular rhythm.     Heart sounds: Normal heart sounds. No murmur. No gallop.   Pulmonary:      Effort: Pulmonary effort is normal. No respiratory distress.     Breath sounds: Normal breath sounds. No stridor. No decreased breath sounds, wheezing, rhonchi or rales.  Abdominal:     General: Bowel sounds are normal. There is no distension.     Palpations: Abdomen is soft.     Tenderness: There is no abdominal tenderness. There is no rebound.  Musculoskeletal:        General: No tenderness. Normal range of motion.     Cervical back: Normal range of motion and neck supple.  Skin:    General: Skin  is warm and dry.     Findings: No abrasion or rash.  Neurological:     Mental Status: He is alert and oriented to person, place, and time.     GCS: GCS eye subscore is 4. GCS verbal subscore is 5. GCS motor subscore is 6.     Cranial Nerves: No cranial nerve deficit or dysarthria.     Sensory: Sensation is intact. No sensory deficit.     Motor: Motor function is intact. No tremor or pronator drift.     Coordination: Coordination is intact.     Gait: Gait normal.  Psychiatric:        Speech: Speech normal.        Behavior: Behavior normal.     ED Results / Procedures / Treatments   Labs (all labs ordered are listed, but only abnormal results are displayed) Labs Reviewed - No data to display  EKG None  Radiology No results found.  Procedures Procedures (including critical care time)  Medications Ordered in ED Medications - No data to display  ED Course  I have reviewed the triage vital signs and the nursing notes.  Pertinent labs & imaging results that were available during my care of the patient were reviewed by me and considered in my medical decision making (see chart for details).    MDM Rules/Calculators/A&P                      Patient MRI of brain and C-spine which did show positive lacunar infarct.  Case discussed with Dr. Cheral Marker from neurology who requested patient be transferred to [facility]  Will consult hospitalist for admission Final Clinical  Impression(s) / ED Diagnoses Final diagnoses:  None    Rx / DC Orders ED Discharge Orders    None       Lacretia Leigh, MD 03/22/20 1344

## 2020-03-22 NOTE — Consult Note (Signed)
Neurology Consultation  Reason for Consult: Right-sided numbness Referring Physician: Dr. Magda Kiel, [facility] hospital[facility]  CC: Right-sided numbness  History is obtained from: Patient, chart  HPI: Thomas [name] is a 69 y.o. male past medical history of chronic arthritis, essential hypertension somewhat difficult to control on 4 medications at home, gout, hyperlipidemia, presenting with last known normal 03/21/2020 at around 3 PM, and noticing around 4 PM that he had some numbness on his right calf and leg. He said he was doing some exercises as has been suggested by his outpatient sports medicine physician when he noticed that his right leg felt a little numb and different than usual.  He did not notice any difficulty on the left leg or any other part of the body. He also noticed a little bit of weakness on the right leg.  He did not make much of it and thought that he had probably stretched something and waited but the symptoms did not go away until this morning, and that is when he presented to [facility] hospital. He was outside the window for any intervention or TPA, with exam consistent with a small vessel stroke. MRI was performed that confirmed a left thalamic lacunar infarct. He was admitted to [facility] for stroke risk factor work-up and management.  Has a family history of uncontrolled hypertension in mother.  No family history of kidney disease or stroke.  Has been evaluated by cardiology for resistant hypertension with no clear etiology.  LKW: 3 PM 03/21/2020 tpa given?: no, outside the window at the time of presentation to the outside ER Premorbid modified Rankin scale (mRS): 0  ROS: Performed and negative except as noted in HPI.  Past Medical History:  Diagnosis Date  . Avulsed toenail, initial encounter 05/04/2018  . Chronic gout    multiple sites--- followed by pcp--- (04-14-2018  per pt last flare-up , march 2019)  . Dyspnea on exertion 02/10/2020  . ED (erectile  dysfunction)   . Epidermoid cyst of neck 10/16/2017  . Essential hypertension 07/21/2017  . Gout 01/19/2014  . Gout of multiple sites 07/21/2017  . History of kidney stones   . History of squamous cell carcinoma excision   . Hyperlipidemia   . Hypertension   . Hypertension not at goal 01/19/2014  . Left ureteral calculus   . Lower abdominal pain 08/27/2015  . Multiple joint pain 07/16/2018  . Pain in joint, shoulder region 12/11/2015  . Preventative health care 11/14/2016  . Urgency of urination   . URI (upper respiratory infection) 11/08/2014  . Wears glasses     Family History  Problem Relation Age of Onset  . Heart disease Mother 37  . Alcohol abuse Mother   . Depression Mother   . Cancer Sister        hodgkins, breast  . Hypertension Father   . Colon cancer Neg Hx   . Pancreatic cancer Neg Hx   . Rectal cancer Neg Hx   . Stomach cancer Neg Hx     Social History:   reports that he quit smoking about 25 years ago. His smoking use included cigarettes. He has a 6.00 pack-year smoking history. He has never used smokeless tobacco. He reports current alcohol use of about 2.0 standard drinks of alcohol per week. He reports that he does not use drugs.  Current non-smoker.  Occasional alcohol use.  No drug abuse.  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  Medications  Current  Facility-Administered Medications:  .  aspirin suppository 300 mg, 300 mg, Rectal, Daily **OR** aspirin tablet 325 mg, 325 mg, Oral, Daily, Hongalgi, Lenis Dickinson, MD  Exam: Current vital signs: BP (!) 167/74 (BP Location: Right Arm)   Pulse 63   Temp 97.7 F (36.5 C) (Oral)   Resp 20   SpO2 100%  Vital signs in last 24 hours: Temp:  [97.7 F (36.5 C)-98.9 F (37.2 C)] 97.7 F (36.5 C) (05/06 1837) Pulse Rate:  [60-79] 63 (05/06 1837) Resp:  [11-26] 20 (05/06 1837) BP: (113-189)/(52-84) 167/74 (05/06 1837) SpO2:  [96 %-100 %] 100 % (05/06 1837) GENERAL: Awake, alert  in NAD HEENT: - Normocephalic and atraumatic, dry mm, no LN++, no Thyromegally LUNGS - Clear to auscultation bilaterally with no wheezes CV - S1S2 RRR, no m/r/g, equal pulses bilaterally. ABDOMEN - Soft, nontender, nondistended with normoactive BS Ext: warm, well perfused, intact peripheral pulses, no edema  NEURO:  Mental Status: AA&Ox3  Language: speech is clear and not dysarthric.  Naming, repetition, fluency, and comprehension intact.  No evidence of aphasia Cranial Nerves: PERRL EOMI, visual fields full, no facial asymmetry, facial sensation intact, hearing intact, tongue/uvula/soft palate midline, normal sternocleidomastoid and trapezius muscle strength. No evidence of tongue atrophy or fibrillations Motor: 5/5 with no drift in any of the 4 extremities. Tone: is normal and bulk is normal Sensation-slightly diminished on the right lower extremity in comparison to the left.  No extinction.  Intact on the face and upper extremities. Coordination: FTN intact bilaterally, no ataxia in BLE. Gait- deferred NIH stroke scale-1 for sensory   Labs I have reviewed labs in epic and the results pertinent to this consultation are:  CBC    Component Value Date/Time   WBC 8.8 03/22/2020 1247   RBC 5.45 03/22/2020 1247   HGB 16.3 03/22/2020 1259   HCT 48.0 03/22/2020 1259   PLT 225 03/22/2020 1247   MCV 87.2 03/22/2020 1247   MCH 29.0 03/22/2020 1247   MCHC 33.3 03/22/2020 1247   RDW 12.9 03/22/2020 1247   LYMPHSABS 2.0 03/22/2020 1247   MONOABS 0.6 03/22/2020 1247   EOSABS 0.1 03/22/2020 1247   BASOSABS 0.0 03/22/2020 1247    CMP     Component Value Date/Time   NA 140 03/22/2020 1259   NA 141 03/20/2020 0951   K 4.2 03/22/2020 1259   CL 98 03/22/2020 1259   CO2 29 03/22/2020 1247   GLUCOSE 88 03/22/2020 1259   BUN 28 (H) 03/22/2020 1259   BUN 23 03/20/2020 0951   CREATININE 1.10 03/22/2020 1259   CREATININE 0.92 11/14/2016 1652   CALCIUM 8.9 03/22/2020 1247   PROT 7.2  03/22/2020 1247   ALBUMIN 4.4 03/22/2020 1247   AST 37 03/22/2020 1247   ALT 66 (H) 03/22/2020 1247   ALKPHOS 63 03/22/2020 1247   BILITOT 1.0 03/22/2020 1247   GFRNONAA >60 03/22/2020 1247   GFRAA >60 03/22/2020 1247   Imaging I have reviewed the images obtained:  MRI examination of the brain-left thalamic lacunar infarct  Assessment: 69 year old man with above past medical history presenting to the emergency room for evaluation of sudden onset of right-sided numbness predominantly in the right leg. MRI reveals a left thalamic lacunar infarct. Etiology likely longstanding uncontrolled hypertension. Evaluate for other etiologies such as an embolic etiology although location of the stroke is characteristic for a small vessel disease type stroke.  Impression: Acute ischemic stroke, likely small vessel etiology Uncontrolled hypertension Hyperlipidemia  Recommendations: Admit to hospitalist  Frequent neurochecks Telemetry Aspirin 325, high intensity statin. Might need dual antiplatelets-further recommendations based on test results and stroke team rounding. 2D echo A1c Lipid panel PT OT Speech therapy He is already passed bedside swallow evaluation-heart healthy carb modified diet ordered by primary team. For now, allow permissive hypertension-treat only if systolic blood pressures greater than 220 on a as needed basis.  After about 48 to 72 hours after the last normal, start normalizing blood pressures with a goal discharge blood pressure of 140/90.  Spoke in detail with the patient and family and answered all their questions.  Stroke team will follow with you.  -- Amie Portland, MD [facility] Pager: [phone] If 7pm to 7am, please call on call as listed on AMION.

## 2020-03-22 NOTE — ED Notes (Signed)
ED Provider at bedside. 

## 2020-03-23 ENCOUNTER — Observation Stay (HOSPITAL_COMMUNITY): Payer: Medicare HMO

## 2020-03-23 ENCOUNTER — Observation Stay (HOSPITAL_BASED_OUTPATIENT_CLINIC_OR_DEPARTMENT_OTHER): Payer: Medicare HMO

## 2020-03-23 DIAGNOSIS — I6389 Other cerebral infarction: Secondary | ICD-10-CM

## 2020-03-23 DIAGNOSIS — I639 Cerebral infarction, unspecified: Secondary | ICD-10-CM | POA: Diagnosis not present

## 2020-03-23 DIAGNOSIS — I63211 Cerebral infarction due to unspecified occlusion or stenosis of right vertebral arteries: Secondary | ICD-10-CM | POA: Diagnosis not present

## 2020-03-23 DIAGNOSIS — I10 Essential (primary) hypertension: Secondary | ICD-10-CM | POA: Diagnosis not present

## 2020-03-23 LAB — LIPID PANEL
Cholesterol: 214 mg/dL — ABNORMAL HIGH (ref 0–200)
HDL: 33 mg/dL — ABNORMAL LOW (ref >40)
LDL Cholesterol: 126 mg/dL — ABNORMAL HIGH (ref 0–99)
Total CHOL/HDL Ratio: 6.5 RATIO
Triglycerides: 273 mg/dL — ABNORMAL HIGH (ref <150)
VLDL: 55 mg/dL — ABNORMAL HIGH (ref 0–40)

## 2020-03-23 LAB — ECHOCARDIOGRAM LIMITED
Height: 70 in
Weight: 3421.54 oz

## 2020-03-23 MED ORDER — CLOPIDOGREL BISULFATE 75 MG PO TABS
75.0000 mg | ORAL_TABLET | Freq: Every day | ORAL | Status: DC
  Administered 2020-03-23: 75 mg via ORAL
  Filled 2020-03-23: qty 1

## 2020-03-23 MED ORDER — CLOPIDOGREL BISULFATE 75 MG PO TABS: 75.0000 mg | ORAL_TABLET | Freq: Every day | ORAL | 0 refills | Status: AC

## 2020-03-23 MED ORDER — ASPIRIN EC 81 MG PO TBEC
81.0000 mg | DELAYED_RELEASE_TABLET | Freq: Every day | ORAL | Status: DC
  Administered 2020-03-23: 81 mg via ORAL
  Filled 2020-03-23: qty 1

## 2020-03-23 MED ORDER — ASPIRIN 81 MG PO TBEC: 81.0000 mg | DELAYED_RELEASE_TABLET | Freq: Every day | ORAL | 0 refills | Status: AC

## 2020-03-23 MED ORDER — ATORVASTATIN CALCIUM 40 MG PO TABS
40.0000 mg | ORAL_TABLET | Freq: Every day | ORAL | Status: DC
  Administered 2020-03-23: 40 mg via ORAL
  Filled 2020-03-23: qty 1

## 2020-03-23 MED ORDER — ATORVASTATIN CALCIUM 40 MG PO TABS: 40.0000 mg | ORAL_TABLET | Freq: Every day | ORAL | 0 refills | Status: DC

## 2020-03-23 MED ORDER — IOHEXOL 350 MG/ML SOLN
75.0000 mL | Freq: Once | INTRAVENOUS | Status: AC | PRN
  Administered 2020-03-23: 75 mL via INTRAVENOUS

## 2020-03-23 NOTE — Evaluation (Signed)
Physical Therapy Evaluation Patient Details Name: Thomas [name] MRN: [id number] DOB: 01-Nov-1951 Today's Date: 03/23/2020   History of Present Illness  Pt is a 69 yo male presenting for R-sided numbness and weakness that onset 5/5 (x1 day PTA). Upon admission, MRI revealed acute L-sided lacunar infarct. PMH includes: Gout, HTN, HLD, and hip strain (currently seeing OPPT).  Clinical Impression  Pt in bed upon arrival of PT, agreeable to evaluation at this time. Prior to admission the pt was completely independent, working as Development worker, international aid at [employer]. Despite above dx, the pt presents without deficits in stability or strength, and reports that his ambulation and stair navigation feel like his baseline mobility. The pt continues to complain of L hip pain, and will continue to benefit from skilled PT to address that pain in an OP setting, but is safe to return home with family support. The pt has no acute PT needs at this time, thank you for the consult.      Follow Up Recommendations Outpatient PT;Supervision for mobility/OOB(continue OP PT for hip pain)    Equipment Recommendations  None recommended by PT    Recommendations for Other Services       Precautions / Restrictions Precautions Precautions: None Restrictions Weight Bearing Restrictions: No      Mobility  Bed Mobility Overal bed mobility: Independent                Transfers Overall transfer level: Needs assistance Equipment used: None Transfers: Sit to/from Stand Sit to Stand: Supervision         General transfer comment: supervision for lines only  Ambulation/Gait Ambulation/Gait assistance: Supervision Gait Distance (Feet): 150 Feet Assistive device: None Gait Pattern/deviations: WFL(Within Functional Limits);Step-through pattern Gait velocity: 0.85 m/s Gait velocity interpretation: >2.62 ft/sec, indicative of community ambulatory General Gait Details: no evidence of instability, no  LOB  Stairs Stairs: Yes Stairs assistance: Supervision Stair Management: One rail Left Number of Stairs: 6 General stair comments: no evidence of instability  Wheelchair Mobility    Modified Rankin (Stroke Patients Only) Modified Rankin (Stroke Patients Only) Pre-Morbid Rankin Score: No symptoms Modified Rankin: Moderate disability     Balance Overall balance assessment: No apparent balance deficits (not formally assessed)                               Standardized Balance Assessment Standardized Balance Assessment : Dynamic Gait Index   Dynamic Gait Index Level Surface: Normal Change in Gait Speed: Normal Gait with Horizontal Head Turns: Normal Gait with Vertical Head Turns: Normal Gait and Pivot Turn: Normal Step Over Obstacle: Mild Impairment Step Around Obstacles: Normal Steps: Normal Total Score: 23       Pertinent Vitals/Pain Pain Assessment: No/denies pain    Home Living Family/patient expects to be discharged to:: Private residence Living Arrangements: Spouse/significant other;Children Available Help at Discharge: Family;Available 24 hours/day Type of Home: House Home Access: Level entry     Home Layout: Two level   Additional Comments: no equipment, none needed    Prior Function Level of Independence: Independent         Comments: pt works as Teacher, adult education at [employer], seeing OPPT for "IT band strain"     Hand Dominance   Dominant Hand: Right    Extremity/Trunk Assessment   Upper Extremity Assessment Upper Extremity Assessment: Defer to OT evaluation;RUE deficits/detail RUE Deficits / Details: pt reports slight decrease in sensation to R shoulder  RUE Sensation: decreased light touch    Lower Extremity Assessment Lower Extremity Assessment: Overall WFL for tasks assessed;RLE deficits/detail;LLE deficits/detail RLE Deficits / Details: pt reports slight decrease in RLE sensation in calf, no other deficits RLE  Sensation: decreased light touch LLE Deficits / Details: pt reports slight pain in L hip/IT band    Cervical / Trunk Assessment Cervical / Trunk Assessment: Normal  Communication   Communication: No difficulties  Cognition Arousal/Alertness: Awake/alert Behavior During Therapy: WFL for tasks assessed/performed Overall Cognitive Status: Within Functional Limits for tasks assessed                                        General Comments      Exercises     Assessment/Plan    PT Assessment Patent does not need any further PT services  PT Problem List         PT Treatment Interventions      PT Goals (Current goals can be found in the Care Plan section)  Acute Rehab PT Goals Patient Stated Goal: resolve hip pain PT Goal Formulation: With patient Time For Goal Achievement: 04/06/20 Potential to Achieve Goals: Good    Frequency     Barriers to discharge        Co-evaluation               AM-PAC PT "6 Clicks" Mobility  Outcome Measure Help needed turning from your back to your side while in a flat bed without using bedrails?: None Help needed moving from lying on your back to sitting on the side of a flat bed without using bedrails?: None Help needed moving to and from a bed to a chair (including a wheelchair)?: None Help needed standing up from a chair using your arms (e.g., wheelchair or bedside chair)?: None Help needed to walk in hospital room?: None Help needed climbing 3-5 steps with a railing? : A Little 6 Click Score: 23    End of Session Equipment Utilized During Treatment: Gait belt Activity Tolerance: Patient tolerated treatment well Patient left: in bed;with family/visitor present Nurse Communication: Mobility status PT Visit Diagnosis: Difficulty in walking, not elsewhere classified (R26.2)    Time: 0623-7628 PT Time Calculation (min) (ACUTE ONLY): 24 min   Charges:   PT Evaluation $PT Eval Moderate Complexity: 1 Mod PT  Treatments $Gait Training: 8-22 mins        Karma Ganja, PT, DPT   Acute Rehabilitation Department Pager #: [phone]  Otho Bellows 03/23/2020, 12:21 PM

## 2020-03-23 NOTE — Plan of Care (Signed)
  RD consulted for nutrition education regarding pre-diabetes.   Lab Results  Component Value Date   HGBA1C 6.3 (H) 03/22/2020    RD reviewed pt's current intake and discussed methods for improving blood sugar control based on what the pt is currently consuming. Discussed different food groups and their effects on blood sugar, emphasizing carbohydrate-containing foods.   Discussed importance of controlled and consistent carbohydrate intake throughout the day. Provided examples of ways to balance meals/snacks and encouraged intake of high-fiber, whole grain complex carbohydrates. Teach back method used.  Expect good compliance.  Body mass index is 30.68 kg/m. Pt meets criteria for obesity based on current BMI. Wt has been stable over the last year.   Current diet order is carb modified. No PO intake currently documented. Pt reports good appetite; however, pt expressing dissatisfaction with the meal options.   Labs and medications reviewed. No further nutrition interventions warranted at this time. If additional nutrition issues arise, please re-consult RD.  Larkin Ina, MS, RD, LDN RD pager number and weekend/on-call pager number located in [place].

## 2020-03-23 NOTE — Progress Notes (Signed)
STROKE TEAM PROGRESS NOTE   INTERVAL HISTORY No family is at the bedside.  Pt not in acute stress, stated that he has been working with his cardiologist for BP control. His BP was high recently and he is on multiple BP meds at home.   Vitals:   03/23/20 0512 03/23/20 0513 03/23/20 0700 03/23/20 0737  BP:   130/64   Pulse: 69 60 60   Resp: 19 15 14    Temp:   98.1 F (36.7 C) 98.2 F (36.8 C)  TempSrc:   Oral Oral  SpO2: 98% 97% 99%   Weight:      Height:        CBC:  Recent Labs  Lab 03/22/20 1247 03/22/20 1259  WBC 8.8  --   NEUTROABS 6.1  --   HGB 15.8 16.3  HCT 47.5 48.0  MCV 87.2  --   PLT 225  --     Basic Metabolic Panel:  Recent Labs  Lab 03/20/20 0951 03/20/20 0951 03/22/20 1247 03/22/20 1259  NA 141  --  138 140  K 4.3   < > 3.8 4.2  CL 100   < > 99 98  CO2 25  --  29  --   GLUCOSE 87  --  92 88  BUN 23  --  23 28*  CREATININE 1.26   < > 1.15 1.10  CALCIUM 9.6  --  8.9  --    < > = values in this interval not displayed.   Lipid Panel:     Component Value Date/Time   CHOL 214 (H) 03/23/2020 0429   TRIG 273 (H) 03/23/2020 0429   HDL 33 (L) 03/23/2020 0429   CHOLHDL 6.5 03/23/2020 0429   VLDL 55 (H) 03/23/2020 0429   LDLCALC 126 (H) 03/23/2020 0429   HgbA1c:  Lab Results  Component Value Date   HGBA1C 6.3 (H) 03/22/2020   Urine Drug Screen:     Component Value Date/Time   LABOPIA NONE DETECTED 03/22/2020 1247   COCAINSCRNUR NONE DETECTED 03/22/2020 1247   LABBENZ NONE DETECTED 03/22/2020 1247   AMPHETMU NONE DETECTED 03/22/2020 1247   THCU NONE DETECTED 03/22/2020 1247   LABBARB NONE DETECTED 03/22/2020 1247    Alcohol Level     Component Value Date/Time   ETH <10 03/22/2020 1247    IMAGING past 24 hours DG Chest 2 View  Result Date: 03/22/2020 CLINICAL DATA:  69 year old male with shortness of breath. EXAM: CHEST - 2 VIEW COMPARISON:  None. FINDINGS: The lungs are clear. There is no pleural effusion pneumothorax. The cardiac  silhouette is within normal limits. No acute osseous pathology. IMPRESSION: No active cardiopulmonary disease. Electronically Signed   By: Anner Crete M.D.   On: 03/22/2020 21:45   MR Brain Wo Contrast (neuro protocol)  Result Date: 03/22/2020 CLINICAL DATA:  Right-sided M this beginning yesterday afternoon at 4 p.m. EXAM: MRI HEAD WITHOUT CONTRAST TECHNIQUE: Multiplanar, multiecho pulse sequences of the brain and surrounding structures were obtained without intravenous contrast. COMPARISON:  None. FINDINGS: Brain: 6 mm acute infarct in the left thalamus. Mild chronic small vessel ischemic type change in the cerebral white matter. Brain volume is normal. No hemorrhage, hydrocephalus, or masslike finding. No chronic blood products. Vascular: Normal flow voids Skull and upper cervical spine: Normal marrow signal Sinuses/Orbits: Negative IMPRESSION: 1. Acute lacunar infarct at the left thalamus. 2. Mild chronic small vessel ischemia in the cerebral white matter. Electronically Signed   By: Neva Seat.D.  On: 03/22/2020 11:40   MR Cervical Spine Wo Contrast  Result Date: 03/22/2020 CLINICAL DATA:  Right-sided weakness EXAM: MRI CERVICAL SPINE WITHOUT CONTRAST TECHNIQUE: Multiplanar, multisequence MR imaging of the cervical spine was performed. No intravenous contrast was administered. COMPARISON:  None. FINDINGS: Alignment: Mild straightening of the cervical lordosis without static listhesis. Vertebrae: No fracture, evidence of discitis, or bone lesion. Mild diffuse intrinsic canal narrowing on the basis of congenitally short pedicles. Cord: Normal signal and morphology. Posterior Fossa, vertebral arteries, paraspinal tissues: Two T2 hyperintense nodules within the left thyroid lobe, largest measuring up to 1.2 cm. Vertebral artery flow voids are preserved. Visualized posterior fossa is within normal limits. Disc levels: C2-C3: No disc protrusion. Mild right-sided facet arthropathy. No foraminal or  canal stenosis. C3-C4: Small posterior disc osteophyte complex with mild bilateral facet and uncovertebral arthropathy result in mild-to-moderate canal stenosis with moderate bilateral foraminal stenosis. C4-C5: Small posterior disc osteophyte complex and mild bilateral facet and uncovertebral arthropathy result in mild canal stenosis with moderate right and mild left foraminal stenosis. C5-C6: Small posterior disc osteophyte complex with left greater than right facet and uncovertebral arthropathy resulting in mild canal stenosis with moderate left and mild right foraminal stenosis. C6-C7: Small posterior disc osteophyte complex with right greater than left facet and uncovertebral arthropathy resulting in severe right and moderate left foraminal stenosis and mild canal stenosis. C7-T1: Small posterior disc osteophyte complex without foraminal or canal stenosis. IMPRESSION: 1. No acute osseous abnormality or abnormal cord signal. 2. Mild to moderate C3-4 and mild C4-5 through C6-7 canal stenosis. 3. Severe right C6-7 and moderate bilateral C3-4 and C4-5 foraminal stenosis. Multilevel mild foraminal stenosis. 4. Mild diffuse intrinsic cervical canal narrowing on the basis of congenitally short pedicles. 5. Left thyroid lobe nodules measuring up to 12 mm. Recommend thyroid US (ref: J Am Coll Radiol. 2015 Feb;12(2): 143-50). Electronically Signed   By: Davina Poke D.O.   On: 03/22/2020 12:18    PHYSICAL EXAM  Temp:  [97.6 F (36.4 C)-98.2 F (36.8 C)] 97.6 F (36.4 C) (05/07 1140) Pulse Rate:  [55-77] 60 (05/07 0700) Resp:  [11-22] 16 (05/07 1140) BP: (89-189)/(52-84) 145/78 (05/07 1140) SpO2:  [95 %-100 %] 98 % (05/07 1140) Weight:  [97 kg] 97 kg (05/06 2354)  General - Well nourished, well developed, in no apparent distress.  Ophthalmologic - fundi not visualized due to noncooperation.  Cardiovascular - Regular rhythm and rate.  Mental Status -  Level of arousal and orientation to time,  place, and person were intact. Language including expression, naming, repetition, comprehension was assessed and found intact. Fund of Knowledge was assessed and was intact.  Cranial Nerves II - XII - II - Visual field intact OU. III, IV, VI - Extraocular movements intact. V - Facial sensation intact bilaterally. VII - Facial movement intact bilaterally. VIII - Hearing & vestibular intact bilaterally. X - Palate elevates symmetrically. XI - Chin turning & shoulder shrug intact bilaterally. XII - Tongue protrusion intact.  Motor Strength - The patient's strength was normal in all extremities and pronator drift was absent.  Bulk was normal and fasciculations were absent.   Motor Tone - Muscle tone was assessed at the neck and appendages and was normal.  Reflexes - The patient's reflexes were symmetrical in all extremities and he had no pathological reflexes.  Sensory - Light touch, temperature/pinprick were assessed and were symmetrical.    Coordination - The patient had normal movements in the hands and feet with no ataxia  or dysmetria.  Tremor was absent.  Gait and Station - deferred.   ASSESSMENT/PLAN [name] is a 69 y.o. male with history of chronic arthritis, essential hypertension somewhat difficult to control on 4 medications at home, gout, hyperlipidemia presenting to [facility] ED with R sided numbness.   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 Mild small vessel disease.   MR CS mild to moderate canal stenosis C3-4, mild C4-5, 5-6, 6-7. severe R C6-7 and moderate B C3-4, 4-5 foraminal stenosis. L thyroid nodule ([place] recommended)  CTA head & neck small L thalamic lacune. No LVO. Market R VA origin stenosis.  2D Echo EF 60-56%. No source of embolus   LDL 126  HgbA1c 6.3  Lovenox 40 mg sq daily for VTE prophylaxis  No antithrombotic prior to admission, now on aspirin 81 mg daily and clopidogrel  75 mg daily. Continue DAPT x 3 weeks then aspirin alone.    Therapy recommendations:  OP PT  Disposition:  Return home  Hypertension  Difficult to control on multiple meds  Stable within post stroke range . Permissive hypertension (OK if < 220/120) but gradually normalize in 2-3 days . Long-term BP goal normotensive  Hyperlipidemia  Home meds:  No statin  Now on lipitor 40  LDL 126, goal < 70  Continue statin at discharge  Other Stroke Risk Factors  Advanced age  Former Cigarette smoker, quit 25 yrs ago  ETOH use, alcohol level <10, advised to drink no more than 2 drink(s) a day  Obesity, Body mass index is 30.68 kg/m., recommend weight loss, diet and exercise as appropriate   Other Active Problems  Erosive gout  Cervical spinal stenosis   L thryoid nodule. For OP [place]  Hospital day # 0  Neurology will sign off. Please call with questions. Pt will follow up with stroke clinic NP at [facility] in about 4 weeks. Thanks for the consult.  Rosalin Hawking, MD PhD Stroke Neurology 03/23/2020 11:01 PM    To contact Stroke Continuity provider, please refer to [email or web address] After hours, contact General Neurology

## 2020-03-23 NOTE — Progress Notes (Signed)
SLP Cancellation Note  Patient Details Name: DELORES THELEN MRN: [id number] DOB: Aug 02, 1951   Cancelled treatment:         Pt screened, no speech/communication needs identified.  Our service will sign off. Pt agrees.  Yesika Rispoli L. Tivis Ringer, [place] CCC/SLP Acute Rehabilitation Services Office number [phone] Pager [phone]   Juan Quam Laurice 03/23/2020, 2:06 PM

## 2020-03-23 NOTE — Progress Notes (Signed)
OT Cancellation Note  Patient Details Name: Thomas [name] MRN: [id number] DOB: 1951/11/17   Cancelled Treatment:    Reason Eval/Treat Not Completed: OT screened, no needs identified, will sign off. Pt at baseline with ADLs. Does endorse some small areas of decreased sensation in RUE/RLE that he feels are beginning to resolve. Discussed reaching out for OP OT referal from PCP if decreased sensation does not resolve in the next couple of weeks or becomes problematic.  Tyrone Schimke, OT [facility] Pager: [phone] Office: [phone]  03/23/2020, 1:44 PM

## 2020-03-23 NOTE — Discharge Planning (Deleted)
Physician Discharge Summary  Thomas [name] JXB:[id number] DOB: 02-08-1951 DOA: 03/22/2020  PCP: Ann Held, DO  Admit date: 03/22/2020 Discharge date: 03/23/2020  Admitted From: home Disposition:  Home  Recommendations for Outpatient Follow-up:  1. Follow up with PCP in 1-2 weeks 2. Please obtain BMP/CBC in one week 3. 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  Patient saw a sports medicine MD yesterday due to left hip pain and was prescribed some therapies.  On 03/21/20 after completing the stretching exercises, patient started to experience right-sided numbness at around 4 PM.  This initially started in the right calf and then involved right half of the body below the neck.  He also reported mild numbness on the right cheek.  May have had questionable right-sided weakness but no difficulty in ambulation, gait or dexterity.  He denies any slurred speech, swallowing difficulty or facial asymmetry.  No headache, dizziness, lightheadedness, chest pain, dyspnea or palpitations.  Symptoms persisted through the course of the night.  This morning he discussed with his orthopedic MD thinking that the exercises may have precipitated his symptoms.  He was advised to proceed to the ED due to strokelike symptoms.  Patient was not on antiplatelets PTA.  He finished 2 doses of his Covid 19 Pfizer vaccine approximately 6 weeks prior.  He was seen by sports medicine on 5/3 for LT band syndrome, left and metatarsalgia of both feet.  He also saw  vascular surgery for asymptomatic high-grade SMA stenosis-no interventions needed at this time.   ED Course: Afebrile, BP high in the 160s-180s/60s-80s, other vital signs stable.  CMP unremarkable except for ALT of 66, CBC normal, urine microscopy unremarkable, blood alcohol level <10, UDS negative, COVID-19 PCR testing pending.  MRI of the brain shows acute lacunar infarct in the left thalamus.  Mild chronic small vessel ischemia in the cerebral white matter.  MRI of the cervical spine shows no acute osseous abnormality or abnormal cord signal.  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  Left thyroid lobe nodules measuring up to 12 mm, thyroid ultrasound recommended.  EDP has discussed with Dr. Cheral Marker, Neuro Hospitalist who recommends transferring patient to [facility] for further evaluation and management.  Patient has passed bedside RN stroke swallow screen and has tolerated regular diet  He was admitted, completed stroke work-up, seen by neurology and advised to discharge on 3 weeks of aspirin Plavix and outpatient neurology follow-up.  His sensation is coming back at this time he is medically stable for discharge home.   Assessment & Plan: Discharge diagnosed:  Acute ischemic stroke with acute lacunar infarct in the left thalamus, resulting into right sided paraesthesias.Last known well approximately 4 PM on 03/21/2020.LDLD 126, HBA1C 6.3, appreciate neuro inputs- completed stroke w/u with, CTA Neck/head no LVO, no acute findings. TTE lvef 60-65%. I discussed w/ neuro- advised asa/plavix x 3 wks then asas 81 mg alone and neuro follow up in 4 wks which will be arranged by neurology.  Essential hypertension:As per patient report and given polypharmacy, suspect difficult to control.  Allow permissive hypertension and treat if BP >220/120. continued  clonidine  For few doses- resume home meds on d/c today    Patient reported borderline dyslipidemia: Patient  was started on Lipitor.   Prediabetes hba1c at 6.3- educated on diabetic diet, pcp follow up and hba1c monitoring in 3-4 months.  Erosive gout: Remotely saw a rheumatologist but currently follows with PCP.Outpatient management.  No acute flare at this time.   Cervical spine stenosis: Outpatient follow-up with orthopedic spine.     Left thyroid lobe nodules measuring up to 12 mm: Nonurgent thyroid ultrasound and any other evaluation as deemed necessary, can be conducted as outpatient.    Consults:  neurology  Subjective: Feeling well, sensation coming back on rt. No focal weakness  Discharge Exam: Vitals:   03/23/20 1140 03/23/20 1335  BP: (!) 145/78 (!) 148/79  Pulse:    Resp: 16   Temp: 97.6 F (36.4 C)   SpO2: 98%    General: Pt is alert, awake, not in acute distress Cardiovascular: RRR, S1/S2 +, no rubs, no gallops Respiratory: CTA bilaterally, no wheezing, no rhonchi Abdominal: Soft, NT, ND, bowel sounds + Extremities: no edema, no cyanosis  Discharge Instructions  Discharge Instructions    Ambulatory referral to Neurology   Complete by: As directed    Follow up with stroke clinic NP (Jessica Vanschaick or Cecille Rubin, if both not available, consider Zachery Dauer, or A[place]) at [facility] in about 4 weeks. Thanks.   Diet - low sodium heart healthy   Complete by: As directed    Discharge instructions   Complete by: As directed    /Please call call MD or return to ER for similar or worsening recurring problem that brought you to hospital or if any fever,nausea/vomiting,abdominal pain, uncontrolled pain, chest pain,  shortness of breath or any other alarming symptoms.  Please follow-up your doctor as instructed in a week time and call the office for appointment.  Please avoid alcohol, smoking, or any other illicit substance and maintain healthy habits including taking your regular medications as prescribed.  You were cared for by a hospitalist during your hospital  stay. If you have any questions about your discharge medications or the care you received while you were in the hospital after you are discharged, you can call the unit and ask to speak with the hospitalist on call if the hospitalist that took care of you is not available.  Once you are discharged, your primary care physician will handle any further medical issues. Please note that NO REFILLS for any discharge medications will be authorized once you are discharged, as it is imperative that you return to your primary care physician (or establish a relationship with a primary care physician if you do not have one) for your aftercare needs so that they can reassess your need for medications and monitor your lab values   Increase activity slowly   Complete by: As directed      Allergies as of 03/23/2020   No Known Allergies     Medication List    TAKE these medications   aspirin 81 MG EC tablet Take 1 tablet (81 mg total) by mouth daily. Start taking on: Mar 24, 2020   atorvastatin 40 MG tablet Commonly known as: LIPITOR Take 1 tablet (40 mg total) by mouth daily. Start taking on: Mar 24, 2020   b complex vitamins tablet Take 1 tablet by mouth daily.   cloNIDine 0.1 MG tablet Commonly known as: CATAPRES Take 1 tablet (0.1 mg total) by mouth 2 (two) times daily.  clopidogrel 75 MG tablet Commonly known as: PLAVIX Take 1 tablet (75 mg total) by mouth daily for 21 days. Start taking on: Mar 24, 2020   colchicine 0.6 MG tablet Take 0.6 mg by mouth 2 (two) times daily.   hydrochlorothiazide 25 MG tablet Commonly known as: HYDRODIURIL TAKE 1 TABLET BY MOUTH EVERY DAY   minoxidil 2.5 MG tablet Commonly known as: LONITEN Take 2 tablets (5 mg total) by mouth daily. What changed:   how much to take  when to take this   traMADol 50 MG tablet Commonly known as: ULTRAM Take 1 tablet (50 mg total) by mouth every 8 (eight) hours as needed. What changed: reasons to take this    valsartan 320 MG tablet Commonly known as: DIOVAN TAKE 1 TABLET BY MOUTH EVERY DAY      Follow-up Information    Roma Schanz R, DO Follow up in 1 week(s).   Specialty: Family Medicine Contact information: [phone] [place] [address] [phone]        [facility]. Schedule an appointment as soon as possible for a visit in 4 week(s).   Specialty: Neurology Contact information: [address] [place] [phone]         No Known Allergies  The results of significant diagnostics from this hospitalization (including imaging, microbiology, ancillary and laboratory) are listed below for reference.    Microbiology: Recent Results (from the past 240 hour(s))  Respiratory Panel by RT PCR (Flu A&B, Covid) - Nasopharyngeal Swab     Status: None   Collection Time: 03/22/20  2:33 PM   Specimen: Nasopharyngeal Swab  Result Value Ref Range Status   SARS Coronavirus 2 by RT PCR NEGATIVE NEGATIVE Final    Comment: (NOTE) SARS-CoV-2 target nucleic acids are NOT DETECTED. The SARS-CoV-2 RNA is generally detectable in upper respiratoy specimens during the acute phase of infection. The lowest concentration of SARS-CoV-2 viral copies this assay can detect is 131 copies/mL. A negative result does not preclude SARS-Cov-2 infection and should not be used as the sole basis for treatment or other patient management decisions. A negative result may occur with  improper specimen collection/handling, submission of specimen other than nasopharyngeal swab, presence of viral mutation(s) within the areas targeted by this assay, and inadequate number of viral copies (<131 copies/mL). A negative result must be combined with clinical observations, patient history, and epidemiological information. The expected result is Negative. Fact Sheet for Patients:  PinkCheek.be Fact Sheet for  Healthcare Providers:  GravelBags.it This test is not yet ap proved or cleared by the [place] FDA and  has been authorized for detection and/or diagnosis of SARS-CoV-2 by FDA under an Emergency Use Authorization (EUA). This EUA will remain  in effect (meaning this test can be used) for the duration of the COVID-19 declaration under Section 564(b)(1) of the Act, 21 U.S.C. section 360bbb-3(b)(1), unless the authorization is terminated or revoked sooner.    Influenza A by PCR NEGATIVE NEGATIVE Final   Influenza B by PCR NEGATIVE NEGATIVE Final    Comment: (NOTE) The Xpert Xpress SARS-CoV-2/FLU/RSV assay is intended as an aid in  the diagnosis of influenza from Nasopharyngeal swab specimens and  should not be used as a sole basis for treatment. Nasal washings and  aspirates are unacceptable for Xpert Xpress SARS-CoV-2/FLU/RSV  testing. Fact Sheet for Patients: PinkCheek.be Fact Sheet for Healthcare Providers: GravelBags.it This test is not yet approved or cleared by the [place]  FDA and  has been authorized for detection and/or diagnosis of SARS-CoV-2 by  FDA under an Emergency Use Authorization (EUA). This EUA will remain  in effect (meaning this test can be used) for the duration of the  Covid-19 declaration under Section 564(b)(1) of the Act, 21  U.S.C. section 360bbb-3(b)(1), unless the authorization is  terminated or revoked. Performed at [facility], [place] [address] [place], [place] [address]     Procedures/Studies: CT ANGIO HEAD W OR WO CONTRAST  Result Date: 03/23/2020 CLINICAL DATA:  Thalamic infarct EXAM: CT ANGIOGRAPHY HEAD AND NECK TECHNIQUE: Multidetector CT imaging of the head and neck was performed using the standard protocol during bolus administration of intravenous contrast. Multiplanar CT image reconstructions and MIPs were obtained to evaluate the  vascular anatomy. Carotid stenosis measurements (when applicable) are obtained utilizing NASCET criteria, using the distal internal carotid diameter as the denominator. CONTRAST:  19mL OMNIPAQUE IOHEXOL 350 MG/ML SOLN COMPARISON:  None. FINDINGS: CT HEAD Brain: There is minimal lucency corresponding to small left thalamic infarct on MRI. There is no acute intracranial hemorrhage, mass effect, or edema. Gray-white differentiation is preserved. There is no extra-axial fluid collection. Ventricles and sulci are within normal limits in size and configuration. Vascular: There is atherosclerotic calcification at the skull base. Skull: Calvarium is unremarkable. Sinuses/Orbits: No acute finding. Other: None. Review of the MIP images confirms the above findings CTA NECK Aortic arch: Great vessel origins are patent. Right carotid system: Patent. Minimal atherosclerotic irregularity at the ICA origin. No measurable stenosis. Left carotid system: Patent. Minimal atherosclerotic irregularity at the ICA origin. No measurable stenosis. Vertebral arteries: Patent. Noncalcified plaque at the right vertebral origin causing marked stenosis without evidence of flow limitation. Skeleton: Multilevel degenerative changes of the cervical spine. Other neck: Heterogeneous thyroid. No ultrasound follow-up is recommended by current guidelines. Upper chest: No apical lung mass. Review of the MIP images confirms the above findings CTA HEAD Anterior circulation: Intracranial internal arteries are patent with mild calcified plaque. Anterior cerebral arteries are patent. Left A1 ACA is dominant. Middle cerebral arteries are patent. Posterior circulation: Intracranial vertebral arteries, basilar artery, and posterior cerebral arteries are patent. A left posterior communicating artery is identified. Venous sinuses: Patent as allowed by contrast bolus timing. Review of the MIP images confirms the above findings IMPRESSION: No acute intracranial  hemorrhage. Small left thalamic infarct better seen on prior MRI. No large vessel occlusion. There is marked stenosis at the right vertebral artery origin without evidence of flow limitation. No significant intracranial stenosis. Electronically Signed   By: Macy Mis M.D.   On: 03/23/2020 11:21   DG Chest 2 View  Result Date: 03/22/2020 CLINICAL DATA:  69 year old male with shortness of breath. EXAM: CHEST - 2 VIEW COMPARISON:  None. FINDINGS: The lungs are clear. There is no pleural effusion pneumothorax. The cardiac silhouette is within normal limits. No acute osseous pathology. IMPRESSION: No active cardiopulmonary disease. Electronically Signed   By: Anner Crete M.D.   On: 03/22/2020 21:45   CT ANGIO NECK W OR WO CONTRAST  Result Date: 03/23/2020 CLINICAL DATA:  Thalamic infarct EXAM: CT ANGIOGRAPHY HEAD AND NECK TECHNIQUE: Multidetector CT imaging of the head and neck was performed using the standard protocol during bolus administration of intravenous contrast. Multiplanar CT image reconstructions and MIPs were obtained to evaluate the vascular anatomy. Carotid stenosis measurements (when applicable) are obtained utilizing NASCET criteria, using the distal internal carotid diameter as the denominator. CONTRAST:  42mL OMNIPAQUE IOHEXOL 350  MG/ML SOLN COMPARISON:  None. FINDINGS: CT HEAD Brain: There is minimal lucency corresponding to small left thalamic infarct on MRI. There is no acute intracranial hemorrhage, mass effect, or edema. Gray-white differentiation is preserved. There is no extra-axial fluid collection. Ventricles and sulci are within normal limits in size and configuration. Vascular: There is atherosclerotic calcification at the skull base. Skull: Calvarium is unremarkable. Sinuses/Orbits: No acute finding. Other: None. Review of the MIP images confirms the above findings CTA NECK Aortic arch: Great vessel origins are patent. Right carotid system: Patent. Minimal atherosclerotic  irregularity at the ICA origin. No measurable stenosis. Left carotid system: Patent. Minimal atherosclerotic irregularity at the ICA origin. No measurable stenosis. Vertebral arteries: Patent. Noncalcified plaque at the right vertebral origin causing marked stenosis without evidence of flow limitation. Skeleton: Multilevel degenerative changes of the cervical spine. Other neck: Heterogeneous thyroid. No ultrasound follow-up is recommended by current guidelines. Upper chest: No apical lung mass. Review of the MIP images confirms the above findings CTA HEAD Anterior circulation: Intracranial internal arteries are patent with mild calcified plaque. Anterior cerebral arteries are patent. Left A1 ACA is dominant. Middle cerebral arteries are patent. Posterior circulation: Intracranial vertebral arteries, basilar artery, and posterior cerebral arteries are patent. A left posterior communicating artery is identified. Venous sinuses: Patent as allowed by contrast bolus timing. Review of the MIP images confirms the above findings IMPRESSION: No acute intracranial hemorrhage. Small left thalamic infarct better seen on prior MRI. No large vessel occlusion. There is marked stenosis at the right vertebral artery origin without evidence of flow limitation. No significant intracranial stenosis. Electronically Signed   By: Macy Mis M.D.   On: 03/23/2020 11:21   MR Brain Wo Contrast (neuro protocol)  Result Date: 03/22/2020 CLINICAL DATA:  Right-sided M this beginning yesterday afternoon at 4 p.m. EXAM: MRI HEAD WITHOUT CONTRAST TECHNIQUE: Multiplanar, multiecho pulse sequences of the brain and surrounding structures were obtained without intravenous contrast. COMPARISON:  None. FINDINGS: Brain: 6 mm acute infarct in the left thalamus. Mild chronic small vessel ischemic type change in the cerebral white matter. Brain volume is normal. No hemorrhage, hydrocephalus, or masslike finding. No chronic blood products. Vascular:  Normal flow voids Skull and upper cervical spine: Normal marrow signal Sinuses/Orbits: Negative IMPRESSION: 1. Acute lacunar infarct at the left thalamus. 2. Mild chronic small vessel ischemia in the cerebral white matter. Electronically Signed   By: Monte Fantasia M.D.   On: 03/22/2020 11:40   MR Cervical Spine Wo Contrast  Result Date: 03/22/2020 CLINICAL DATA:  Right-sided weakness EXAM: MRI CERVICAL SPINE WITHOUT CONTRAST TECHNIQUE: Multiplanar, multisequence MR imaging of the cervical spine was performed. No intravenous contrast was administered. COMPARISON:  None. FINDINGS: Alignment: Mild straightening of the cervical lordosis without static listhesis. Vertebrae: No fracture, evidence of discitis, or bone lesion. Mild diffuse intrinsic canal narrowing on the basis of congenitally short pedicles. Cord: Normal signal and morphology. Posterior Fossa, vertebral arteries, paraspinal tissues: Two T2 hyperintense nodules within the left thyroid lobe, largest measuring up to 1.2 cm. Vertebral artery flow voids are preserved. Visualized posterior fossa is within normal limits. Disc levels: C2-C3: No disc protrusion. Mild right-sided facet arthropathy. No foraminal or canal stenosis. C3-C4: Small posterior disc osteophyte complex with mild bilateral facet and uncovertebral arthropathy result in mild-to-moderate canal stenosis with moderate bilateral foraminal stenosis. C4-C5: Small posterior disc osteophyte complex and mild bilateral facet and uncovertebral arthropathy result in mild canal stenosis with moderate right and mild left foraminal stenosis. C5-C6: Small posterior  disc osteophyte complex with left greater than right facet and uncovertebral arthropathy resulting in mild canal stenosis with moderate left and mild right foraminal stenosis. C6-C7: Small posterior disc osteophyte complex with right greater than left facet and uncovertebral arthropathy resulting in severe right and moderate left foraminal  stenosis and mild canal stenosis. C7-T1: Small posterior disc osteophyte complex without foraminal or canal stenosis. IMPRESSION: 1. No acute osseous abnormality or abnormal cord signal. 2. Mild to moderate C3-4 and mild C4-5 through C6-7 canal stenosis. 3. Severe right C6-7 and moderate bilateral C3-4 and C4-5 foraminal stenosis. Multilevel mild foraminal stenosis. 4. Mild diffuse intrinsic cervical canal narrowing on the basis of congenitally short pedicles. 5. Left thyroid lobe nodules measuring up to 12 mm. Recommend thyroid US (ref: J Am Coll Radiol. 2015 Feb;12(2): 143-50). Electronically Signed   By: Davina Poke D.O.   On: 03/22/2020 12:18   DG Foot Complete Left  Result Date: 03/20/2020 CLINICAL DATA:  Bilateral forefoot pain. Assess for arthritis. No injury. EXAM: LEFT FOOT - COMPLETE 3+ VIEW COMPARISON:  None. FINDINGS: Examination demonstrates mild degenerative changes of the first MTP joint with possible erosive change along the medial aspect of the head of the first metatarsal. Minimal degenerative change over the tibiotalar joint. Small inferior calcaneal spur. IMPRESSION: Degenerative changes over the first MTP joint with possible associated erosive change possibly due to gout and less likely erosive osteoarthritis. Electronically Signed   By: Marin Olp M.D.   On: 03/20/2020 15:28   DG Foot Complete Right  Result Date: 03/20/2020 CLINICAL DATA:  Bilateral forefoot pain. Assess for arthritis. History of gout. EXAM: RIGHT FOOT COMPLETE - 3+ VIEW COMPARISON:  None. FINDINGS: No fracture or dislocation. Small erosion at the medial first proximal phalanx at the metatarsal phalangeal joint. Erosions involving the lateral aspect of the second metatarsal head and medial aspect of the third metatarsal head. Hindfoot osteoarthritis involving the subtalar joint as well as calcaneal cuboid articulation. There is a small plantar calcaneal spur. No periosteal reaction or bony destruction. Soft  tissues are unremarkable. IMPRESSION: 1. Erosions at the first proximal phalanx, second metatarsal head, and third metatarsal head. Findings likely related to multifocal gouty arthritis, however the possibility of other erosive arthropathies are not entirely excluded. 2. Hindfoot osteoarthritis.  Plantar calcaneal spur. Electronically Signed   By: Keith Rake M.D.   On: 03/20/2020 16:15   ECHOCARDIOGRAM LIMITED  Result Date: 03/23/2020    ECHOCARDIOGRAM LIMITED REPORT   Patient Name:   MOKSH LOOMER Date of Exam: 03/23/2020 Medical Rec #:  [id number]      Height:       70.0 in Accession #:    [id number]     Weight:       213.8 lb Date of Birth:  03-31-1951      BSA:          2.148 m Patient Age:    70 years       BP:           185/79 mmHg Patient Gender: M              HR:           58 bpm. Exam Location:  Inpatient Procedure: Limited Color Doppler, Cardiac Doppler and Limited Echo Indications:    stroke 434.91  History:        Patient has no prior history of Echocardiogram examinations and  Patient has prior history of Echocardiogram examinations, most                 recent 02/14/2020. Risk Factors:Hypertension.  Sonographer:    Johny Chess Referring Phys: [place]  1. Left ventricular ejection fraction, by estimation, is 60 to 65%. The left ventricle has normal function. The left ventricle has no regional wall motion abnormalities. There is mild concentric left ventricular hypertrophy. Left ventricular diastolic function could not be evaluated.  2. Right ventricular systolic function is normal. The right ventricular size is normal.  3. The mitral valve is normal in structure. Trivial mitral valve regurgitation. No evidence of mitral stenosis.  4. The aortic valve is tricuspid. Aortic valve regurgitation is not visualized. No aortic stenosis is present. Comparison(s): No prior Echocardiogram. Conclusion(s)/Recommendation(s): No intracardiac source of embolism  detected on this transthoracic study. A transesophageal echocardiogram is recommended to exclude cardiac source of embolism if clinically indicated. FINDINGS  Left Ventricle: Left ventricular ejection fraction, by estimation, is 60 to 65%. The left ventricle has normal function. The left ventricle has no regional wall motion abnormalities. The left ventricular internal cavity size was normal in size. There is  mild concentric left ventricular hypertrophy. Right Ventricle: The right ventricular size is normal. No increase in right ventricular wall thickness. Right ventricular systolic function is normal. Left Atrium: Left atrial size was normal in size. Right Atrium: Right atrial size was normal in size. Pericardium: Trivial pericardial effusion is present. Mitral Valve: The mitral valve is normal in structure. Trivial mitral valve regurgitation. No evidence of mitral valve stenosis. Tricuspid Valve: The tricuspid valve is normal in structure. Tricuspid valve regurgitation is trivial. No evidence of tricuspid stenosis. Aortic Valve: The aortic valve is tricuspid. Aortic valve regurgitation is not visualized. No aortic stenosis is present. Pulmonic Valve: The pulmonic valve was grossly normal. Pulmonic valve regurgitation is not visualized. No evidence of pulmonic stenosis. Aorta: The ascending aorta was not well visualized and the aortic arch was not well visualized. Venous: The inferior vena cava was not well visualized. IAS/Shunts: No atrial level shunt detected by color flow Doppler.  LEFT VENTRICLE PLAX 2D LVIDd:         4.40 cm LVIDs:         3.00 cm LV PW:         1.30 cm LV IVS:        1.10 cm LVOT diam:     2.00 cm LV SV:         76 LV SV Index:   36 LVOT Area:     3.14 cm  LEFT ATRIUM         Index LA diam:    3.60 cm 1.68 cm/m  AORTIC VALVE LVOT Vmax:   122.00 cm/s LVOT Vmean:  77.900 cm/s LVOT VTI:    0.243 m  AORTA Ao Root diam: 2.80 cm MITRAL VALVE MV Area (PHT): 2.91 cm    SHUNTS MV Decel Time: 261  msec    Systemic VTI:  0.24 m MV E velocity: 92.50 cm/s  Systemic Diam: 2.00 cm MV A velocity: 94.30 cm/s MV E/A ratio:  0.98 Buford Dresser MD Electronically signed by Buford Dresser MD Signature Date/Time: 03/23/2020/12:27:44 PM    Final     Labs: BNP (last 3 results) No results for input(s): BNP in the last 8760 hours. Basic Metabolic Panel: Recent Labs  Lab 03/20/20 0951 03/22/20 1247 03/22/20 1259  NA 141 138 140  K 4.3  3.8 4.2  CL 100 99 98  CO2 25 29  --   GLUCOSE 87 92 88  BUN 23 23 28*  CREATININE 1.26 1.15 1.10  CALCIUM 9.6 8.9  --    Liver Function Tests: Recent Labs  Lab 03/22/20 1247  AST 37  ALT 66*  ALKPHOS 63  BILITOT 1.0  PROT 7.2  ALBUMIN 4.4   No results for input(s): LIPASE, AMYLASE in the last 168 hours. No results for input(s): AMMONIA in the last 168 hours. CBC: Recent Labs  Lab 03/22/20 1247 03/22/20 1259  WBC 8.8  --   NEUTROABS 6.1  --   HGB 15.8 16.3  HCT 47.5 48.0  MCV 87.2  --   PLT 225  --    Cardiac Enzymes: No results for input(s): CKTOTAL, CKMB, CKMBINDEX, TROPONINI in the last 168 hours. BNP: Invalid input(s): POCBNP CBG: No results for input(s): GLUCAP in the last 168 hours. D-Dimer No results for input(s): DDIMER in the last 72 hours. Hgb A1c Recent Labs    03/22/20 2104  HGBA1C 6.3*   Lipid Profile Recent Labs    03/23/20 0429  CHOL 214*  HDL 33*  LDLCALC 126*  TRIG 273*  CHOLHDL 6.5   Thyroid function studies No results for input(s): TSH, T4TOTAL, T3FREE, THYROIDAB in the last 72 hours.  Invalid input(s): FREET3 Anemia work up No results for input(s): VITAMINB12, FOLATE, FERRITIN, TIBC, IRON, RETICCTPCT in the last 72 hours. Urinalysis    Component Value Date/Time   COLORURINE YELLOW 03/22/2020 1247   APPEARANCEUR HAZY (A) 03/22/2020 1247   LABSPEC 1.013 03/22/2020 1247   PHURINE 7.0 03/22/2020 1247   GLUCOSEU NEGATIVE 03/22/2020 1247   HGBUR NEGATIVE 03/22/2020 1247   BILIRUBINUR  NEGATIVE 03/22/2020 1247   BILIRUBINUR neg 08/27/2015 1103   KETONESUR NEGATIVE 03/22/2020 1247   PROTEINUR NEGATIVE 03/22/2020 1247   UROBILINOGEN 0.2 08/27/2015 1103   NITRITE NEGATIVE 03/22/2020 1247   LEUKOCYTESUR NEGATIVE 03/22/2020 1247   Sepsis Labs Invalid input(s): PROCALCITONIN,  WBC,  LACTICIDVEN Microbiology Recent Results (from the past 240 hour(s))  Respiratory Panel by RT PCR (Flu A&B, Covid) - Nasopharyngeal Swab     Status: None   Collection Time: 03/22/20  2:33 PM   Specimen: Nasopharyngeal Swab  Result Value Ref Range Status   SARS Coronavirus 2 by RT PCR NEGATIVE NEGATIVE Final    Comment: (NOTE) SARS-CoV-2 target nucleic acids are NOT DETECTED. The SARS-CoV-2 RNA is generally detectable in upper respiratoy specimens during the acute phase of infection. The lowest concentration of SARS-CoV-2 viral copies this assay can detect is 131 copies/mL. A negative result does not preclude SARS-Cov-2 infection and should not be used as the sole basis for treatment or other patient management decisions. A negative result may occur with  improper specimen collection/handling, submission of specimen other than nasopharyngeal swab, presence of viral mutation(s) within the areas targeted by this assay, and inadequate number of viral copies (<131 copies/mL). A negative result must be combined with clinical observations, patient history, and epidemiological information. The expected result is Negative. Fact Sheet for Patients:  PinkCheek.be Fact Sheet for Healthcare Providers:  GravelBags.it This test is not yet ap proved or cleared by the [place] FDA and  has been authorized for detection and/or diagnosis of SARS-CoV-2 by FDA under an Emergency Use Authorization (EUA). This EUA will remain  in effect (meaning this test can be used) for the duration of the COVID-19 declaration under Section 564(b)(1) of the Act,  21 U.S.C.  section 360bbb-3(b)(1), unless the authorization is terminated or revoked sooner.    Influenza A by PCR NEGATIVE NEGATIVE Final   Influenza B by PCR NEGATIVE NEGATIVE Final    Comment: (NOTE) The Xpert Xpress SARS-CoV-2/FLU/RSV assay is intended as an aid in  the diagnosis of influenza from Nasopharyngeal swab specimens and  should not be used as a sole basis for treatment. Nasal washings and  aspirates are unacceptable for Xpert Xpress SARS-CoV-2/FLU/RSV  testing. Fact Sheet for Patients: PinkCheek.be Fact Sheet for Healthcare Providers: GravelBags.it This test is not yet approved or cleared by the [place] FDA and  has been authorized for detection and/or diagnosis of SARS-CoV-2 by  FDA under an Emergency Use Authorization (EUA). This EUA will remain  in effect (meaning this test can be used) for the duration of the  Covid-19 declaration under Section 564(b)(1) of the Act, 21  U.S.C. section 360bbb-3(b)(1), unless the authorization is  terminated or revoked. Performed at [facility], [place] [address], [place], [place] [address]      Time coordinating discharge: 25  minutes  SIGNED: Antonieta Pert, MD  [facility] 03/23/2020, 2:41 PM  If 7PM-7AM, please contact night-coverage [email or web address]

## 2020-03-23 NOTE — Social Work (Signed)
CSW met with pt at bedside. CSW introduced self and explained her role. CSW completed sbirt with pt.  Pt scored a 0 on the sbirt scale. Pt denied alcohol use. Pt denied substance use. Pt did not need resources at this time.  Emeterio Reeve, Latanya Presser, [place] Social Worker [phone]

## 2020-03-23 NOTE — Progress Notes (Signed)
  Echocardiogram 2D Echocardiogram has been performed.  Thomas [name] 03/23/2020, 10:06 AM

## 2020-03-23 NOTE — Progress Notes (Signed)
OT Cancellation Note  Patient Details Name: Thomas [name] MRN: [id number] DOB: [birth year]   Cancelled Treatment:    Reason Eval/Treat Not Completed: Patient at procedure or test/ unavailable. Plan to reattempt.  Tyrone Schimke, OT Acute Rehabilitation Services Pager: [phone] Office: [phone]  03/23/2020, 10:03 AM

## 2020-03-24 NOTE — Discharge Summary (Signed)
Thomas [name] PTW:[id number] DOB: November 16, 1951 DOA: 03/22/2020  PCP: Ann Held, DO   Admit date: 03/22/2020  Discharge date: 03/23/2020  Admitted From: home  Disposition: Home   Recommendations for Outpatient Follow-up:  1. Follow up with PCP in 1-2 weeks 2. Please obtain BMP/CBC in one week 3. 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 Patient saw a sports medicine MD yesterday due to left hip pain and was prescribed some therapies. On 03/21/20 after completing the stretching exercises, patient started to experience right-sided numbness at around 4 PM. This initially started in the right calf and then involved right half of the body below the neck. He also reported mild numbness on the right cheek. May have had questionable right-sided weakness but no difficulty in ambulation, gait or dexterity. He denies any slurred speech, swallowing difficulty or facial asymmetry. No headache, dizziness, lightheadedness, chest pain, dyspnea or palpitations. Symptoms persisted through the course of the night. This morning he discussed with his orthopedic MD thinking that the exercises may have precipitated his symptoms. He was advised to proceed to the ED due to strokelike symptoms. Patient was not on antiplatelets PTA. He finished 2 doses of his Covid 19 Pfizer vaccine approximately 6 weeks prior. He was seen by sports medicine on 5/3 for LT band syndrome, left and metatarsalgia of both feet. He also saw vascular surgery for asymptomatic high-grade  SMA stenosis-no interventions needed at this time.  ED Course: Afebrile, BP high in the 160s-180s/60s-80s, other vital signs stable. CMP unremarkable except for ALT of 66, CBC normal, urine microscopy unremarkable, blood alcohol level <10, UDS negative, COVID-19 PCR testing pending. MRI of the brain shows acute lacunar infarct in the left thalamus. Mild chronic small vessel ischemia in the cerebral white matter. MRI of the cervical spine shows no acute osseous abnormality or abnormal cord signal. 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 Left thyroid lobe nodules measuring up to 12 mm, thyroid ultrasound recommended. EDP has discussed with Dr. Cheral Marker, Neuro Hospitalist who recommends transferring patient to [facility] for further evaluation and management. Patient has passed bedside RN stroke swallow screen and has tolerated regular diet  He was admitted, completed stroke work-up, seen by neurology and advised to discharge on 3 weeks of aspirin Plavix and outpatient neurology follow-up. His sensation is coming back at this time he is medically stable for discharge home.   Assessment & Plan:  Discharge diagnosis:  Acute ischemic stroke with acute lacunar infarct in the left thalamus, resulting into right sided paraesthesias.Last known well approximately 4 PM on 03/21/2020.LDLD 126, HBA1C 6.3, appreciate neuro inputs- completed stroke w/u with, CTA Neck/head no LVO, no acute findings. TTE lvef 60-65%. I discussed w/ neuro- advised asa/plavix x 3 wks then asas 81 mg alone and neuro follow up in 4 wks which will be arranged by neurology.  Essential hypertension:As per patient report and given polypharmacy, suspect difficult to control. Allow permissive hypertension and treat if BP >220/120. continued clonidine For few doses- resume home meds on d/c today  Patient reported borderline dyslipidemia: Patient was started on Lipitor.  Prediabetes hba1c at 6.3- educated on  diabetic diet, pcp follow up and hba1c monitoring in 3-4 months.  Erosive gout: Remotely saw a rheumatologist but currently follows with PCP.Outpatient management. No acute flare at this time.  Cervical spine stenosis: Outpatient follow-up with orthopedic spine.  Left thyroid lobe nodules measuring up to 12 mm: Nonurgent thyroid ultrasound and any other evaluation as deemed necessary, can be conducted as outpatient.   Consults:  neurology Subjective:  Feeling well, sensation coming back on rt.  No focal weakness  Discharge Exam:      Vitals:   03/23/20 1140 03/23/20 1335  BP: (!) 145/78 (!) 148/79  Pulse:    Resp: 16   Temp: 97.6 F (36.4 C)   SpO2: 98%    General: Pt is alert, awake, not in acute distress  Cardiovascular: RRR, S1/S2 +, no rubs, no gallops  Respiratory: CTA bilaterally, no wheezing, no rhonchi  Abdominal: Soft, NT, ND, bowel sounds +  Extremities: no edema, no cyanosis  Discharge Instructions      Discharge Instructions     Ambulatory referral to Neurology  Complete by: As directed    Follow up with stroke clinic NP (Jessica Vanschaick or Cecille Rubin, if both not available, consider Zachery Dauer, or [place]) at [facility] in about 4 weeks. Thanks.   Diet - low sodium heart healthy  Complete by: As directed    Discharge instructions  Complete by: As directed    /Please call call MD or return to ER for similar or worsening recurring problem that brought you to hospital or if any fever,nausea/vomiting,abdominal pain, uncontrolled pain, chest pain, shortness of breath or any other alarming symptoms.  Please follow-up your doctor as instructed in a week time and call the office for appointment.  Please avoid alcohol, smoking, or any other illicit substance and maintain healthy habits including taking your regular medications as prescribed.  You were cared for by a hospitalist during your hospital stay. If you have any questions about your discharge medications or the  care you received while you were in the hospital after you are discharged, you can call the unit and ask to speak with the hospitalist on call if the hospitalist that took care of you is not available.  Once you are discharged, your primary care physician will handle any further medical issues. Please note that NO REFILLS for any discharge medications will be authorized once you are discharged, as it is imperative that you return to your primary care physician (or establish a relationship with a primary care physician if you do not have one) for your aftercare needs so that they can reassess your need for medications and monitor your lab values   Increase activity slowly  Complete by: As directed       Allergies as of 03/23/2020   No Known Allergies          Medication List     TAKE these medications    aspirin 81 MG EC tablet  Take 1 tablet (81 mg total) by mouth daily.  Start taking on: Mar 24, 2020   atorvastatin 40 MG tablet  Commonly known as: LIPITOR  Take 1 tablet (40 mg total) by mouth daily.  Start taking on: Mar 24, 2020   b complex vitamins tablet  Take 1 tablet by mouth daily.   cloNIDine 0.1 MG tablet  Commonly known as: CATAPRES  Take 1 tablet (0.1 mg total) by mouth 2 (two) times daily.   clopidogrel 75 MG  tablet  Commonly known as: PLAVIX  Take 1 tablet (75 mg total) by mouth daily for 21 days.  Start taking on: Mar 24, 2020   colchicine 0.6 MG tablet  Take 0.6 mg by mouth 2 (two) times daily.   hydrochlorothiazide 25 MG tablet  Commonly known as: HYDRODIURIL  TAKE 1 TABLET BY MOUTH EVERY DAY   minoxidil 2.5 MG tablet  Commonly known as: LONITEN  Take 2 tablets (5 mg total) by mouth daily.  What changed:  how much to take  when to take this  traMADol 50 MG tablet  Commonly known as: ULTRAM  Take 1 tablet (50 mg total) by mouth every 8 (eight) hours as needed.  What changed: reasons to take this   valsartan 320 MG tablet  Commonly known as: DIOVAN  TAKE 1  TABLET BY MOUTH EVERY DAY          Follow-up Information     Roma Schanz R, DO Follow up in 1 week(s).    Specialty: Family Medicine  Contact information:  [phone] [place] [address]  [phone]        [facility]. Schedule an appointment as soon as possible for a visit in 4 week(s).    Specialty: Neurology  Contact information:  [address] [place]  [phone]          No Known Allergies  The results of significant diagnostics from this hospitalization (including imaging, microbiology, ancillary and laboratory) are listed below for reference.   Microbiology:         Recent Results (from the past 240 hour(s))  Respiratory Panel by RT PCR (Flu A&B, Covid) - Nasopharyngeal Swab Status: None   Collection Time: 03/22/20 2:33 PM   Specimen: Nasopharyngeal Swab  Result Value Ref Range Status   SARS Coronavirus 2 by RT PCR NEGATIVE NEGATIVE Final    Comment: (NOTE)  SARS-CoV-2 target nucleic acids are NOT DETECTED.  The SARS-CoV-2 RNA is generally detectable in upper respiratoy  specimens during the acute phase of infection. The lowest  concentration of SARS-CoV-2 viral copies this assay can detect is  131 copies/mL. A negative result does not preclude SARS-Cov-2  infection and should not be used as the sole basis for treatment or  other patient management decisions. A negative result may occur with  improper specimen collection/handling, submission of specimen other  than nasopharyngeal swab, presence of viral mutation(s) within the  areas targeted by this assay, and inadequate number of viral copies  (<131 copies/mL). A negative result must be combined with clinical  observations, patient history, and epidemiological information. The  expected result is Negative.  Fact Sheet for Patients:  PinkCheek.be  Fact Sheet for Healthcare Providers:   GravelBags.it  This test is not yet ap proved or cleared by the [place] FDA and  has been authorized for detection and/or diagnosis of SARS-CoV-2 by  FDA under an Emergency Use Authorization (EUA). This EUA will remain  in effect (meaning this test can be used) for the duration of the  COVID-19 declaration under Section 564(b)(1) of the Act, 21 U.S.C.  section 360bbb-3(b)(1), unless the authorization is terminated or  revoked sooner.    Influenza A by PCR NEGATIVE NEGATIVE Final   Influenza B by PCR NEGATIVE NEGATIVE Final    Comment: (NOTE)  The Xpert Xpress SARS-CoV-2/FLU/RSV assay is intended as an aid in  the diagnosis of influenza from Nasopharyngeal swab specimens and  should not be used as a sole basis for treatment. Nasal washings and  aspirates are unacceptable for Xpert Xpress SARS-CoV-2/FLU/RSV  testing.  Fact Sheet for Patients:  PinkCheek.be  Fact Sheet for Healthcare Providers:  GravelBags.it  This test is not yet approved or cleared by the [place] FDA and  has been authorized for detection and/or diagnosis of SARS-CoV-2 by  FDA under an Emergency Use Authorization (EUA). This EUA will remain  in effect (meaning this test can be used) for the duration of the  Covid-19 declaration under Section 564(b)(1) of the Act, 21  U.S.C. section 360bbb-3(b)(1), unless the authorization is  terminated or revoked.  Performed at [facility], [place] [address],  [place],  [address]    Procedures/Studies:  Imaging Results    Labs:  BNP (last 3 results)  Recent Labs (within last 365 days)    Basic Metabolic Panel:  Last Labs                                                                  Liver Function Tests:  Last Labs                                       Last Labs      Last Labs     CBC:  Last Labs                                               Cardiac Enzymes:  Last Labs     BNP:  Last Labs     CBG:  Last Labs     D-Dimer  Recent Labs (last 2 labs)     Hgb A1c  Recent Labs (last 2 labs)                  Lipid Profile  Recent Labs (last 2 labs)                                  Thyroid function studies  Recent Labs (last 2 labs)     Anemia work up  [employer] (last 2 labs)     Urinalysis  Labs (Brief)                                                                                              Sepsis Labs  Last Labs     Microbiology         Recent Results (from the past 240 hour(s))  Respiratory Panel by RT PCR (Flu A&B, Covid) - Nasopharyngeal Swab Status: None   Collection Time: 03/22/20  2:33 PM   Specimen: Nasopharyngeal Swab  Result Value Ref Range Status   SARS Coronavirus 2 by RT PCR NEGATIVE NEGATIVE Final    Comment: (NOTE)  SARS-CoV-2 target nucleic acids are NOT DETECTED.  The SARS-CoV-2 RNA is generally detectable in upper respiratoy  specimens during the acute phase of infection. The lowest  concentration of SARS-CoV-2 viral copies this assay can detect is  131 copies/mL. A negative result does not preclude SARS-Cov-2  infection and should not be used as the sole basis for treatment or  other patient management decisions. A negative result may occur with  improper specimen collection/handling, submission of specimen other  than nasopharyngeal swab, presence of viral mutation(s) within the  areas targeted by this assay, and inadequate number of viral copies  (<131 copies/mL). A negative result must be combined with clinical  observations, patient history, and epidemiological information. The  expected result is Negative.  Fact Sheet for Patients:  PinkCheek.be  Fact Sheet for Healthcare Providers:  GravelBags.it  This test is not yet ap proved or cleared by the [place] FDA and   has been authorized for detection and/or diagnosis of SARS-CoV-2 by  FDA under an Emergency Use Authorization (EUA). This EUA will remain  in effect (meaning this test can be used) for the duration of the  COVID-19 declaration under Section 564(b)(1) of the Act, 21 U.S.C.  section 360bbb-3(b)(1), unless the authorization is terminated or  revoked sooner.    Influenza A by PCR NEGATIVE NEGATIVE Final   Influenza B by PCR NEGATIVE NEGATIVE Final    Comment: (NOTE)  The Xpert Xpress SARS-CoV-2/FLU/RSV assay is intended as an aid in  the diagnosis of influenza from Nasopharyngeal swab specimens and  should not be used as a sole basis for treatment. Nasal washings and  aspirates are unacceptable for Xpert Xpress SARS-CoV-2/FLU/RSV  testing.  Fact Sheet for Patients:  PinkCheek.be  Fact Sheet for Healthcare Providers:  GravelBags.it  This test is not yet approved or cleared by the [place] FDA and  has been authorized for detection and/or diagnosis of SARS-CoV-2 by  FDA under an Emergency Use Authorization (EUA). This EUA will remain  in effect (meaning this test can be used) for the duration of the  Covid-19 declaration under Section 564(b)(1) of the Act, 21  U.S.C. section 360bbb-3(b)(1), unless the authorization is  terminated or revoked.  Performed at [facility], [place] [address],  [place], [place] [address]    Time coordinating discharge: 25 minutes  SIGNED:  Antonieta Pert, MD  [facility]  03/23/2020, 2:41 PM  If 7PM-7AM, please contact night-coverage  [email or web address]

## 2020-03-26 ENCOUNTER — Telehealth: Payer: Self-pay | Admitting: Family Medicine

## 2020-03-26 DIAGNOSIS — E663 Overweight: Secondary | ICD-10-CM | POA: Diagnosis not present

## 2020-03-26 DIAGNOSIS — M1A09X0 Idiopathic chronic gout, multiple sites, without tophus (tophi): Secondary | ICD-10-CM | POA: Diagnosis not present

## 2020-03-26 DIAGNOSIS — M2550 Pain in unspecified joint: Secondary | ICD-10-CM | POA: Diagnosis not present

## 2020-03-26 DIAGNOSIS — M150 Primary generalized (osteo)arthritis: Secondary | ICD-10-CM | POA: Diagnosis not present

## 2020-03-26 DIAGNOSIS — Z6829 Body mass index (BMI) 29.0-29.9, adult: Secondary | ICD-10-CM | POA: Diagnosis not present

## 2020-03-26 NOTE — Telephone Encounter (Signed)
Patient is requesting a transfer of care from Dr. Roma Schanz to Dr. Penni Homans .  Please Advise

## 2020-03-26 NOTE — Telephone Encounter (Signed)
Unless I have a family member I am not accepting new patients at this time.

## 2020-03-26 NOTE — Telephone Encounter (Signed)
He is welcome to see anyone else

## 2020-03-26 NOTE — Telephone Encounter (Signed)
Dr Charlett Blake is not taking new patients

## 2020-03-27 LAB — DRUG MONITORING, PANEL 8 WITH CONFIRMATION, URINE
6 Acetylmorphine: NEGATIVE ng/mL (ref <10)
Alcohol Metabolites: NEGATIVE ng/mL
Amphetamines: NEGATIVE ng/mL (ref <500)
Benzodiazepines: NEGATIVE ng/mL (ref <100)
Buprenorphine, Urine: NEGATIVE ng/mL (ref <5)
Cocaine Metabolite: NEGATIVE ng/mL (ref <150)
Creatinine: 156.7 mg/dL
MDMA: NEGATIVE ng/mL (ref <500)
Marijuana Metabolite: NEGATIVE ng/mL (ref <20)
Opiates: NEGATIVE ng/mL (ref <100)
Oxidant: NEGATIVE ug/mL
Oxycodone: NEGATIVE ng/mL (ref <100)
pH: 6.2 (ref 4.5–9.0)

## 2020-03-27 LAB — DRUG MONITOR, TRAMADOL,QN, URINE
Desmethyltramadol: 1588 ng/mL — ABNORMAL HIGH (ref <100)
Tramadol: 3806 ng/mL — ABNORMAL HIGH (ref <100)

## 2020-03-27 LAB — DM TEMPLATE

## 2020-03-27 NOTE — Telephone Encounter (Signed)
I spoke with patient to let him know Dr. Charlett Blake is currently not accepting new patients.  He said he is technically not a "new patient", but I explained to him, he would be new to her and her schedule is very full and she has other obligations outside the office with [place] which does not allow for her to take on new patients at this time.   I suggested other offices within [facility] and he said [facility] is not taking patients; he had tried there.  He said [facility] was not convenient for him.  I suggested [facility] to him.

## 2020-03-28 ENCOUNTER — Encounter: Payer: Self-pay | Admitting: Family Medicine

## 2020-04-02 ENCOUNTER — Telehealth: Payer: Self-pay | Admitting: *Deleted

## 2020-04-02 NOTE — Telephone Encounter (Signed)
Pt states he has a new PCP, but would not provide name. Pt does have hospital follow up scheduled with Neurology.

## 2020-04-10 ENCOUNTER — Other Ambulatory Visit: Payer: Self-pay | Admitting: Family Medicine

## 2020-04-10 DIAGNOSIS — M10079 Idiopathic gout, unspecified ankle and foot: Secondary | ICD-10-CM

## 2020-04-10 DIAGNOSIS — M5442 Lumbago with sciatica, left side: Secondary | ICD-10-CM | POA: Diagnosis not present

## 2020-04-10 DIAGNOSIS — M50323 Other cervical disc degeneration at C6-C7 level: Secondary | ICD-10-CM | POA: Diagnosis not present

## 2020-04-10 DIAGNOSIS — M2550 Pain in unspecified joint: Secondary | ICD-10-CM

## 2020-04-10 DIAGNOSIS — M47817 Spondylosis without myelopathy or radiculopathy, lumbosacral region: Secondary | ICD-10-CM | POA: Diagnosis not present

## 2020-04-10 DIAGNOSIS — M5137 Other intervertebral disc degeneration, lumbosacral region: Secondary | ICD-10-CM | POA: Diagnosis not present

## 2020-04-10 DIAGNOSIS — M50321 Other cervical disc degeneration at C4-C5 level: Secondary | ICD-10-CM | POA: Diagnosis not present

## 2020-04-10 DIAGNOSIS — M9901 Segmental and somatic dysfunction of cervical region: Secondary | ICD-10-CM | POA: Diagnosis not present

## 2020-04-10 DIAGNOSIS — M9903 Segmental and somatic dysfunction of lumbar region: Secondary | ICD-10-CM | POA: Diagnosis not present

## 2020-04-10 DIAGNOSIS — M50322 Other cervical disc degeneration at C5-C6 level: Secondary | ICD-10-CM | POA: Diagnosis not present

## 2020-04-10 DIAGNOSIS — M9904 Segmental and somatic dysfunction of sacral region: Secondary | ICD-10-CM | POA: Diagnosis not present

## 2020-04-10 DIAGNOSIS — M5136 Other intervertebral disc degeneration, lumbar region: Secondary | ICD-10-CM | POA: Diagnosis not present

## 2020-04-11 DIAGNOSIS — E669 Obesity, unspecified: Secondary | ICD-10-CM | POA: Diagnosis not present

## 2020-04-11 DIAGNOSIS — Z8679 Personal history of other diseases of the circulatory system: Secondary | ICD-10-CM | POA: Diagnosis not present

## 2020-04-11 DIAGNOSIS — I10 Essential (primary) hypertension: Secondary | ICD-10-CM | POA: Diagnosis not present

## 2020-04-11 DIAGNOSIS — G8929 Other chronic pain: Secondary | ICD-10-CM | POA: Diagnosis not present

## 2020-04-11 DIAGNOSIS — Z1331 Encounter for screening for depression: Secondary | ICD-10-CM | POA: Diagnosis not present

## 2020-04-11 DIAGNOSIS — M109 Gout, unspecified: Secondary | ICD-10-CM | POA: Diagnosis not present

## 2020-04-12 ENCOUNTER — Encounter: Payer: Self-pay | Admitting: Family Medicine

## 2020-04-12 DIAGNOSIS — M47817 Spondylosis without myelopathy or radiculopathy, lumbosacral region: Secondary | ICD-10-CM | POA: Diagnosis not present

## 2020-04-12 DIAGNOSIS — M50321 Other cervical disc degeneration at C4-C5 level: Secondary | ICD-10-CM | POA: Diagnosis not present

## 2020-04-12 DIAGNOSIS — M9901 Segmental and somatic dysfunction of cervical region: Secondary | ICD-10-CM | POA: Diagnosis not present

## 2020-04-12 DIAGNOSIS — M5136 Other intervertebral disc degeneration, lumbar region: Secondary | ICD-10-CM | POA: Diagnosis not present

## 2020-04-12 DIAGNOSIS — M50322 Other cervical disc degeneration at C5-C6 level: Secondary | ICD-10-CM | POA: Diagnosis not present

## 2020-04-12 DIAGNOSIS — M5137 Other intervertebral disc degeneration, lumbosacral region: Secondary | ICD-10-CM | POA: Diagnosis not present

## 2020-04-12 DIAGNOSIS — M9903 Segmental and somatic dysfunction of lumbar region: Secondary | ICD-10-CM | POA: Diagnosis not present

## 2020-04-12 DIAGNOSIS — M5442 Lumbago with sciatica, left side: Secondary | ICD-10-CM | POA: Diagnosis not present

## 2020-04-12 DIAGNOSIS — M50323 Other cervical disc degeneration at C6-C7 level: Secondary | ICD-10-CM | POA: Diagnosis not present

## 2020-04-12 DIAGNOSIS — M9904 Segmental and somatic dysfunction of sacral region: Secondary | ICD-10-CM | POA: Diagnosis not present

## 2020-04-12 NOTE — Telephone Encounter (Signed)
Sending since we discussed about this patient this morning and I know you wanted to discuss with Thomas [name].

## 2020-04-15 ENCOUNTER — Other Ambulatory Visit: Payer: Self-pay | Admitting: Cardiology

## 2020-04-15 DIAGNOSIS — I10 Essential (primary) hypertension: Secondary | ICD-10-CM

## 2020-04-17 ENCOUNTER — Other Ambulatory Visit: Payer: Self-pay | Admitting: Internal Medicine

## 2020-04-17 DIAGNOSIS — M50322 Other cervical disc degeneration at C5-C6 level: Secondary | ICD-10-CM | POA: Diagnosis not present

## 2020-04-17 DIAGNOSIS — M50321 Other cervical disc degeneration at C4-C5 level: Secondary | ICD-10-CM | POA: Diagnosis not present

## 2020-04-17 DIAGNOSIS — M9901 Segmental and somatic dysfunction of cervical region: Secondary | ICD-10-CM | POA: Diagnosis not present

## 2020-04-17 DIAGNOSIS — M9904 Segmental and somatic dysfunction of sacral region: Secondary | ICD-10-CM | POA: Diagnosis not present

## 2020-04-17 DIAGNOSIS — M5136 Other intervertebral disc degeneration, lumbar region: Secondary | ICD-10-CM | POA: Diagnosis not present

## 2020-04-17 DIAGNOSIS — M5442 Lumbago with sciatica, left side: Secondary | ICD-10-CM | POA: Diagnosis not present

## 2020-04-17 DIAGNOSIS — E042 Nontoxic multinodular goiter: Secondary | ICD-10-CM

## 2020-04-17 DIAGNOSIS — M47817 Spondylosis without myelopathy or radiculopathy, lumbosacral region: Secondary | ICD-10-CM | POA: Diagnosis not present

## 2020-04-17 DIAGNOSIS — M50323 Other cervical disc degeneration at C6-C7 level: Secondary | ICD-10-CM | POA: Diagnosis not present

## 2020-04-17 DIAGNOSIS — M5137 Other intervertebral disc degeneration, lumbosacral region: Secondary | ICD-10-CM | POA: Diagnosis not present

## 2020-04-17 DIAGNOSIS — M9903 Segmental and somatic dysfunction of lumbar region: Secondary | ICD-10-CM | POA: Diagnosis not present

## 2020-04-18 ENCOUNTER — Encounter: Payer: Self-pay | Admitting: Family Medicine

## 2020-04-18 ENCOUNTER — Other Ambulatory Visit: Payer: Self-pay

## 2020-04-18 ENCOUNTER — Ambulatory Visit: Payer: Medicare HMO | Admitting: Family Medicine

## 2020-04-18 VITALS — BP 152/78 | Ht 70.0 in | Wt 210.0 lb

## 2020-04-18 DIAGNOSIS — M549 Dorsalgia, unspecified: Secondary | ICD-10-CM

## 2020-04-18 NOTE — Progress Notes (Signed)
PCP: No primary care provider on file.  Subjective:   HPI: Patient is a 69 y.o. male here for right side pain.  Patient reports his hip pain has improved since last visit. He reports about a day after his recent CVA he noted pain in right side of abdomen worse with twisting along with pain in right side of thoracic and lumbar spine. He went to chiropractor for 4-5 visits with some benefit. Massage helped with a large knot in right side of upper back medial to scapula. No radiation into extremities. No numbness or tingling now.  Past Medical History:  Diagnosis Date  . Avulsed toenail, initial encounter 05/04/2018  . Chronic gout    multiple sites--- followed by pcp--- (04-14-2018  per pt last flare-up , march 2019)  . Dyspnea on exertion 02/10/2020  . ED (erectile dysfunction)   . Epidermoid cyst of neck 10/16/2017  . Essential hypertension 07/21/2017  . Gout 01/19/2014  . Gout of multiple sites 07/21/2017  . History of kidney stones   . History of squamous cell carcinoma excision   . Hyperlipidemia   . Hypertension   . Hypertension not at goal 01/19/2014  . Left ureteral calculus   . Lower abdominal pain 08/27/2015  . Multiple joint pain 07/16/2018  . Pain in joint, shoulder region 12/11/2015  . Preventative health care 11/14/2016  . Urgency of urination   . URI (upper respiratory infection) 11/08/2014  . Wears glasses     Current Outpatient Medications on File Prior to Visit  Medication Sig Dispense Refill  . allopurinol (ZYLOPRIM) 100 MG tablet Take 100 mg by mouth daily.    Marland Kitchen aspirin EC 81 MG EC tablet Take 1 tablet (81 mg total) by mouth daily. 60 tablet 0  . atorvastatin (LIPITOR) 40 MG tablet Take 1 tablet (40 mg total) by mouth daily. 30 tablet 0  . b complex vitamins tablet Take 1 tablet by mouth daily.    . cloNIDine (CATAPRES) 0.1 MG tablet TAKE 1 TABLET BY MOUTH TWICE A DAY 60 tablet 1  . colchicine 0.6 MG tablet Take 0.6 mg by mouth 2 (two) times daily.    .  hydrochlorothiazide (HYDRODIURIL) 25 MG tablet TAKE 1 TABLET BY MOUTH EVERY DAY (Patient taking differently: Take 25 mg by mouth daily. ) 30 tablet 5  . minoxidil (LONITEN) 2.5 MG tablet Take 2 tablets (5 mg total) by mouth daily. (Patient taking differently: Take 2.5 mg by mouth 2 (two) times daily. ) 60 tablet 2  . traMADol (ULTRAM) 50 MG tablet Take 1 tablet (50 mg total) by mouth every 8 (eight) hours as needed. (Patient taking differently: Take 50 mg by mouth every 8 (eight) hours as needed for moderate pain. ) 45 tablet 0  . valsartan (DIOVAN) 320 MG tablet TAKE 1 TABLET BY MOUTH EVERY DAY (Patient taking differently: Take 320 mg by mouth daily. ) 90 tablet 0   No current facility-administered medications on file prior to visit.    Past Surgical History:  Procedure Laterality Date  . COLONOSCOPY WITH PROPOFOL  2017  . CYSTOSCOPY/RETROGRADE/URETEROSCOPY/STONE EXTRACTION WITH BASKET Left 04/16/2018   Procedure: CYSTOSCOPY/RETROGRADE/URETEROSCOPY/STONE EXTRACTION WITH BASKET/ HOLMIUM LASER LITHOTRIPSY/ STENT PLACEMENT;  Surgeon: Ardis Hughs, MD;  Location: [facility];  Service: Urology;  Laterality: Left;  . HOLMIUM LASER APPLICATION Left 5/00/3704   Procedure: HOLMIUM LASER APPLICATION;  Surgeon: Ardis Hughs, MD;  Location[facility];  Service: Urology;  Laterality: Left;    No Known  Allergies  Social History   Socioeconomic History  . Marital status: Married    Spouse name: Not on file  . Number of children: Not on file  . Years of education: Not on file  . Highest education level: Not on file  Occupational History  . Occupation: Pharmacist, hospital  Tobacco Use  . Smoking status: Former Smoker    Packs/day: 0.30    Years: 20.00    Pack years: 6.00    Types: Cigarettes    Quit date: 10/17/1994    Years since quitting: 25.5  . Smokeless tobacco: Never Used  Substance and Sexual Activity  . Alcohol use: Yes    Alcohol/week: 2.0 standard  drinks    Types: 2 Glasses of wine per week    Comment: rare  . Drug use: No  . Sexual activity: Yes  Other Topics Concern  . Not on file  Social History Narrative   Exercise 1 hour a day ---3-4 days a week   Social Determinants of Health   Financial Resource Strain:   . Difficulty of Paying Living Expenses:   Food Insecurity:   . Worried About Charity fundraiser in the Last Year:   . Arboriculturist in the Last Year:   Transportation Needs:   . Film/video editor (Medical):   Marland Kitchen Lack of Transportation (Non-Medical):   Physical Activity:   . Days of Exercise per Week:   . Minutes of Exercise per Session:   Stress:   . Feeling of Stress :   Social Connections:   . Frequency of Communication with Friends and Family:   . Frequency of Social Gatherings with Friends and Family:   . Attends Religious Services:   . Active Member of Clubs or Organizations:   . Attends Archivist Meetings:   Marland Kitchen Marital Status:   Intimate Partner Violence:   . Fear of Current or Ex-Partner:   . Emotionally Abused:   Marland Kitchen Physically Abused:   . Sexually Abused:     Family History  Problem Relation Age of Onset  . Heart disease Mother 34  . Alcohol abuse Mother   . Depression Mother   . Cancer Sister        hodgkins, breast  . Hypertension Father   . Colon cancer Neg Hx   . Pancreatic cancer Neg Hx   . Rectal cancer Neg Hx   . Stomach cancer Neg Hx     BP (!) 152/78   Ht 5\' 10"  (1.778 m)   Wt 210 lb (95.3 kg)   BMI 30.13 kg/m   Review of Systems: See HPI above.     Objective:  Physical Exam:  Gen: NAD, comfortable in exam room  Neck: No gross deformity, swelling, bruising. TTP right inferior trapezius, medial to right scapula.  No midline/bony TTP. FROM. BUE strength 5/5.   Sensation intact to light touch. NV intact distal BUEs.  Back: No gross deformity, scoliosis. TTP mildly right lat, obliques.  No midline or bony TTP. FROM with pain on trunk rotation  right obliques. Strength LEs 5/5 all muscle groups.   Negative SLRs. Sensation intact to light touch bilaterally. Negative logroll bilateral hips   Assessment & Plan:  1. Right side pain - consistent with strains of obliques, trapezius, mildly of lat on right side.  Start physical therapy and home exercises.  Heat, tylenol.  F/u in 6 weeks.

## 2020-04-18 NOTE — Patient Instructions (Signed)
You have strains of your obliques, trapezius, and to a lesser extent your latissimus on the right side. Start physical therapy and do home exercises on days you don't go to therapy. Heat 15 minutes at a time 3-4 times a day. Tylenol if needed for pain. Follow up with me in about 6 weeks for reevaluation.

## 2020-04-19 DIAGNOSIS — M47817 Spondylosis without myelopathy or radiculopathy, lumbosacral region: Secondary | ICD-10-CM | POA: Diagnosis not present

## 2020-04-19 DIAGNOSIS — M50323 Other cervical disc degeneration at C6-C7 level: Secondary | ICD-10-CM | POA: Diagnosis not present

## 2020-04-19 DIAGNOSIS — M5442 Lumbago with sciatica, left side: Secondary | ICD-10-CM | POA: Diagnosis not present

## 2020-04-19 DIAGNOSIS — M50322 Other cervical disc degeneration at C5-C6 level: Secondary | ICD-10-CM | POA: Diagnosis not present

## 2020-04-19 DIAGNOSIS — M9901 Segmental and somatic dysfunction of cervical region: Secondary | ICD-10-CM | POA: Diagnosis not present

## 2020-04-19 DIAGNOSIS — M5137 Other intervertebral disc degeneration, lumbosacral region: Secondary | ICD-10-CM | POA: Diagnosis not present

## 2020-04-19 DIAGNOSIS — M50321 Other cervical disc degeneration at C4-C5 level: Secondary | ICD-10-CM | POA: Diagnosis not present

## 2020-04-19 DIAGNOSIS — M9904 Segmental and somatic dysfunction of sacral region: Secondary | ICD-10-CM | POA: Diagnosis not present

## 2020-04-19 DIAGNOSIS — M5136 Other intervertebral disc degeneration, lumbar region: Secondary | ICD-10-CM | POA: Diagnosis not present

## 2020-04-19 DIAGNOSIS — M9903 Segmental and somatic dysfunction of lumbar region: Secondary | ICD-10-CM | POA: Diagnosis not present

## 2020-04-24 ENCOUNTER — Encounter: Payer: Self-pay | Admitting: Adult Health

## 2020-04-24 ENCOUNTER — Ambulatory Visit: Payer: Medicare HMO | Admitting: Adult Health

## 2020-04-24 VITALS — BP 172/82 | HR 86 | Ht 71.0 in | Wt 213.0 lb

## 2020-04-24 DIAGNOSIS — Z9189 Other specified personal risk factors, not elsewhere classified: Secondary | ICD-10-CM

## 2020-04-24 DIAGNOSIS — I10 Essential (primary) hypertension: Secondary | ICD-10-CM

## 2020-04-24 DIAGNOSIS — I6381 Other cerebral infarction due to occlusion or stenosis of small artery: Secondary | ICD-10-CM

## 2020-04-24 DIAGNOSIS — E785 Hyperlipidemia, unspecified: Secondary | ICD-10-CM

## 2020-04-24 DIAGNOSIS — I639 Cerebral infarction, unspecified: Secondary | ICD-10-CM

## 2020-04-24 NOTE — Patient Instructions (Addendum)
Continue aspirin 81 mg daily for secondary stroke prevention  Please ensure you follow up with your PCP and cardiology in regards to cholesterol management - if you do not wish to try a different statin or cholesterol medication, would recommend use of omega 3 fatty acid supplement during the mean time  Referral placed to be seen by our sleep specialist to rule out sleep apnea   Continue to follow up with PCP regarding cholesterol and blood pressure management   Referral placed to physical therapy at neuro rehab   Continue to monitor blood pressure at home  Maintain strict control of hypertension with blood pressure goal below 130/90, diabetes with hemoglobin A1c goal below 6.5% and cholesterol with LDL cholesterol (bad cholesterol) goal below 70 mg/dL. I also advised the patient to eat a healthy diet with plenty of whole grains, cereals, fruits and vegetables, exercise regularly and maintain ideal body weight.  Followup in the future with me in 3 months or call earlier if needed        Thank you for coming to see [place] at [facility]. I hope we have been able to provide you high quality care today.  You may receive a patient satisfaction survey over the next few weeks. We would appreciate your feedback and comments so that we may continue to improve ourselves and the health of our patients.

## 2020-04-24 NOTE — Progress Notes (Signed)
[facility] [address] [place]. [place] [address] [phone]       HOSPITAL FOLLOW UP NOTE  [name] Date of Birth:  1950/12/16 Medical Record Number:  [id number]   Reason for Referral:  hospital stroke follow up    SUBJECTIVE:   CHIEF COMPLAINT:  Chief Complaint  Patient presents with   Follow-up    tx rm here for a f/u on a stroke. Pt said he had a reaction to the lipitor.     HPI:   [name] is a 69 y.o. male with history of chronic arthritis, essential hypertension somewhat difficult to control on 4 medications at home, gout, hyperlipidemia present on 03/22/2020 to [facility] ED with R sided numbness.  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  CTA negative LVO with marked right VA origin stenosis.  Initiated DAPT for 3 weeks and aspirin alone.  History of HTN stable during admission and recommended long-term BP goal normotensive range.  LDL 126 and initiated atorvastatin 40 mg daily.  Other stroke risk factors include advanced age, former tobacco use, EtOH use and obesity.  No prior history of stroke.  Other active problems include cervical spinal stenosis and left thyroid nodule as evidenced on MR CS and recommended PCP follow-up outpatient.  Evaluated by therapy who recommended outpatient PT and discharged home in stable condition.  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 Mild small vessel disease.   MR CS mild to moderate canal stenosis C3-4, mild C4-5, 5-6, 6-7. severe R C6-7 and moderate B C3-4, 4-5 foraminal stenosis. L thyroid nodule ([place] recommended)  CTA head & neck small L thalamic lacune. No LVO. Market R VA origin stenosis.  2D Echo EF 60-56%. No source of embolus   LDL 126  HgbA1c 6.3  Lovenox 40 mg sq daily for VTE prophylaxis  No antithrombotic prior to admission, now  on aspirin 81 mg daily and clopidogrel 75 mg daily. Continue DAPT x 3 weeks then aspirin alone.    Therapy recommendations:  OP PT  Disposition:  Return home  Today, 04/24/2020, Thomas [name] is being seen for hospital follow-up. He has been doing well since discharge. He has continued to experience numbness and increased sensitivity on right side which typically worsens towards the end of the day but does report overall improvement.  He also has experienced increased fatigue and decreased activity tolerance.  Recently seen by sports medicine due to right-sided pains with underlying chronic pain history.  He plans on starting physical therapy in the near future. Completed 3 weeks DAPT and continues on aspirin alone without bleeding or bruising.  Self discontinued atorvastatin as he had difficulty tolerating due to myalgias. Since discontinuing, myalgias subsided.  No new statin has been started. He has made drastic dietary changes as he wishes to manage cholesterol by diet due to potential statin side effects. Blood pressure today 172/82.  Monitors at home and has been 140s at home. Currently being followed by cardiology with longstanding history of blood pressure management difficulty.  He endorses frustrations during today's visit as they have not been able to find a cause for continued uncontrolled blood pressures.  Previously discussed possible sleep apnea with cardiology but sleep study declined by insurance.  He does report snoring, daytime fatigue, witnessed apnea and occasional insomnia.  No further concerns at this time.  ROS:   14 system review of systems performed and negative with exception of joint pain, numbness/tingling  PMH:  Past Medical History:  Diagnosis Date   Avulsed toenail, initial encounter 05/04/2018   Chronic gout    multiple sites--- followed by pcp--- (04-14-2018  per pt last flare-up , march 2019)   Dyspnea on exertion 02/10/2020   ED (erectile dysfunction)     Epidermoid cyst of neck 10/16/2017   Essential hypertension 07/21/2017   Gout 01/19/2014   Gout of multiple sites 07/21/2017   History of kidney stones    History of squamous cell carcinoma excision    Hyperlipidemia    Hypertension    Hypertension not at goal 01/19/2014   Left ureteral calculus    Lower abdominal pain 08/27/2015   Multiple joint pain 07/16/2018   Pain in joint, shoulder region 12/11/2015   Preventative health care 11/14/2016   Urgency of urination    URI (upper respiratory infection) 11/08/2014   Wears glasses     PSH:  Past Surgical History:  Procedure Laterality Date   COLONOSCOPY WITH PROPOFOL  2017   CYSTOSCOPY/RETROGRADE/URETEROSCOPY/STONE EXTRACTION WITH BASKET Left 04/16/2018   Procedure: CYSTOSCOPY/RETROGRADE/URETEROSCOPY/STONE EXTRACTION WITH BASKET/ HOLMIUM LASER LITHOTRIPSY/ STENT PLACEMENT;  Surgeon: Ardis Hughs, MD;  Location: [facility];  Service: Urology;  Laterality: Left;   HOLMIUM LASER APPLICATION Left 03/05/3789   Procedure: HOLMIUM LASER APPLICATION;  Surgeon: Ardis Hughs, MD;  Location[facility];  Service: Urology;  Laterality: Left;    Social History:  Social History   Socioeconomic History   Marital status: Married    Spouse name: Not on file   Number of children: Not on file   Years of education: Not on file   Highest education level: Not on file  Occupational History   Occupation: teacher  Tobacco Use   Smoking status: Former Smoker    Packs/day: 0.30    Years: 20.00    Pack years: 6.00    Types: Cigarettes    Quit date: 10/17/1994    Years since quitting: 25.5   Smokeless tobacco: Never Used  Substance and Sexual Activity   Alcohol use: Yes    Alcohol/week: 2.0 standard drinks    Types: 2 Glasses of wine per week    Comment: rare   Drug use: No   Sexual activity: Yes  Other Topics Concern   Not on file  Social History Narrative   Exercise 1 hour a  day ---3-4 days a week   Social Determinants of Health   Financial Resource Strain:    Difficulty of Paying Living Expenses:   Food Insecurity:    Worried About Charity fundraiser in the Last Year:    Arboriculturist in the Last Year:   Transportation Needs:    Film/video editor (Medical):    Lack of Transportation (Non-Medical):   Physical Activity:    Days of Exercise per Week:    Minutes of Exercise per Session:   Stress:    Feeling of Stress :   Social Connections:    Frequency of Communication with Friends and Family:    Frequency of Social Gatherings with Friends and Family:    Attends Religious Services:    Active Member of Clubs or Organizations:    Attends Archivist Meetings:    Marital Status:   Intimate Partner Violence:    Fear of Current or Ex-Partner:    Emotionally Abused:  Physically Abused:    Sexually Abused:     Family History:  Family History  Problem Relation Age of Onset   Heart disease Mother 42   Alcohol abuse Mother    Depression Mother    Cancer Sister        hodgkins, breast   Hypertension Father    Colon cancer Neg Hx    Pancreatic cancer Neg Hx    Rectal cancer Neg Hx    Stomach cancer Neg Hx     Medications:   Current Outpatient Medications on File Prior to Visit  Medication Sig Dispense Refill   allopurinol (ZYLOPRIM) 100 MG tablet Take 100 mg by mouth daily.     aspirin EC 81 MG EC tablet Take 1 tablet (81 mg total) by mouth daily. 60 tablet 0   b complex vitamins tablet Take 1 tablet by mouth daily.     cloNIDine (CATAPRES) 0.1 MG tablet TAKE 1 TABLET BY MOUTH TWICE A DAY 60 tablet 1   colchicine 0.6 MG tablet Take 0.6 mg by mouth 2 (two) times daily.     hydrochlorothiazide (HYDRODIURIL) 25 MG tablet TAKE 1 TABLET BY MOUTH EVERY DAY (Patient taking differently: Take 25 mg by mouth daily. ) 30 tablet 5   minoxidil (LONITEN) 2.5 MG tablet Take 2 tablets (5 mg total) by mouth  daily. (Patient taking differently: Take 2.5 mg by mouth 2 (two) times daily. ) 60 tablet 2   traMADol (ULTRAM) 50 MG tablet Take 1 tablet (50 mg total) by mouth every 8 (eight) hours as needed. (Patient taking differently: Take 50 mg by mouth every 8 (eight) hours as needed for moderate pain. ) 45 tablet 0   valsartan (DIOVAN) 320 MG tablet TAKE 1 TABLET BY MOUTH EVERY DAY (Patient taking differently: Take 320 mg by mouth daily. ) 90 tablet 0   No current facility-administered medications on file prior to visit.    Allergies:  No Known Allergies    OBJECTIVE:  Physical Exam  Vitals:   04/24/20 0755 04/24/20 0907  BP: (!) 186/86 (!) 172/82  Pulse: 86   Weight: 213 lb (96.6 kg)   Height: 5\' 11"  (1.803 m)    Body mass index is 29.71 kg/m. No exam data present  General: well developed, well nourished,  pleasant middle-age Caucasian male, seated, in no evident distress Head: head normocephalic and atraumatic.   Neck: supple with no carotid or supraclavicular bruits Cardiovascular: regular rate and rhythm, no murmurs Musculoskeletal: no deformity Skin:  no rash/petichiae Vascular:  Normal pulses all extremities   Neurologic Exam Mental Status: Awake and fully alert.   Fluent speech and language.  Oriented to place and time. Recent and remote memory intact. Attention span, concentration and fund of knowledge appropriate. Mood and affect appropriate.  Cranial Nerves: Fundoscopic exam reveals sharp disc margins. Pupils equal, briskly reactive to light. Extraocular movements full without nystagmus. Visual fields full to confrontation. Hearing intact. Facial sensation intact. Face, tongue, palate moves normally and symmetrically.  Motor: Normal bulk and tone. Normal strength in all tested extremity muscles. Sensory.:  Hypersensitivity to light touch on RLE but intact sensation RUE and left side Coordination: Rapid alternating movements normal in all extremities. Finger-to-nose and  heel-to-shin performed accurately bilaterally. Gait and Station: Arises from chair without difficulty. Stance is normal. Gait demonstrates normal stride length and balance Reflexes: 1+ and symmetric. Toes downgoing.     NIHSS  0 Modified Rankin  2      ASSESSMENT: Thomas Nazar  [name] is a 69 y.o. 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 Vascular risk factors include HTN, HLD, advanced age, former tobacco use and EtOH use.  Residual deficits of right-sided hypersensitivity which has been gradually improving.  Patient self discontinued atorvastatin due to myalgias.     PLAN:  1. Left thalamic stroke:  -Right hypersensitivity and pain, post stroke: Referral placed to neuro rehab PT -Continue aspirin 81 mg daily daily for secondary stroke prevention.  Discussion regarding use of statins for secondary stroke prevention and patient verbalizing understanding of importance but declines interest in trialing different type of statin.  Maintain strict control of hypertension with blood pressure goal below 130/90, diabetes with hemoglobin A1c goal below 6.5% and cholesterol with LDL cholesterol (bad cholesterol) goal below 70 mg/dL.  I also advised the patient to eat a healthy diet with plenty of whole grains, cereals, fruits and vegetables, exercise regularly with at least 30 minutes of continuous activity daily and maintain ideal body weight. 2. HTN: Remains uncontrolled.  Plans on following up with endocrinology for possible endocrine involvement causing uncontrolled blood pressure as well as obtaining thyroid ultrasound next week 3. HLD: Discussion regarding importance of use of statin for secondary stroke prevention.  Unfortunately, experienced myalgias with use of atorvastatin and declines interest in trialing a different type of statin or use of Zetia.  He plans on continuing to modify  diet and have repeat lipid panel at follow-up visit with PCP.  He verbalizes understanding of increased risk of recurrent stroke with untreated HLD 4. High risk of sleep apnea: Referral placed to [place] [facility] for further evaluation of possibly underlying sleep apnea which may be contributing to uncontrolled blood pressure as well as increased risk of cardiovascular disease and recurrent stroke with untreated sleep apnea.    Follow up in 3 months or call earlier if needed   I spent 50 minutes of face-to-face and non-face-to-face time with patient.  This included previsit chart review, lab review, study review, order entry, electronic health record documentation, patient education regarding recent stroke, residual deficits, importance of managing stroke risk factors and answered all questions to patient satisfaction     Frann Rider, [facility]  [facility] [address] [place], [place] [address]  Phone [phone] Fax [phone] Note: This document was prepared with digital dictation and possible smart phrase technology. Any transcriptional errors that result from this process are unintentional.

## 2020-04-26 ENCOUNTER — Ambulatory Visit
Admission: RE | Admit: 2020-04-26 | Discharge: 2020-04-26 | Disposition: A | Discharge status: Home / Self Care | Payer: Medicare HMO | Source: Ambulatory Visit | Attending: Internal Medicine | Admitting: Internal Medicine

## 2020-04-26 DIAGNOSIS — E042 Nontoxic multinodular goiter: Secondary | ICD-10-CM | POA: Diagnosis not present

## 2020-04-27 ENCOUNTER — Ambulatory Visit: Payer: Medicare HMO | Attending: Adult Health

## 2020-04-27 ENCOUNTER — Other Ambulatory Visit: Payer: Self-pay

## 2020-04-27 DIAGNOSIS — I69351 Hemiplegia and hemiparesis following cerebral infarction affecting right dominant side: Secondary | ICD-10-CM | POA: Insufficient documentation

## 2020-04-27 DIAGNOSIS — M25511 Pain in right shoulder: Secondary | ICD-10-CM | POA: Diagnosis present

## 2020-04-27 DIAGNOSIS — R0781 Pleurodynia: Secondary | ICD-10-CM | POA: Insufficient documentation

## 2020-04-27 DIAGNOSIS — R2681 Unsteadiness on feet: Secondary | ICD-10-CM

## 2020-04-27 DIAGNOSIS — M25552 Pain in left hip: Secondary | ICD-10-CM | POA: Diagnosis present

## 2020-04-27 NOTE — Therapy (Signed)
[facility] [address] [place], [place], [address] Phone: [phone]   Fax:  [phone]  Physical Therapy Evaluation  Patient Details  Name: Thomas [name] MRN: [id number] Date of Birth: [birth year] Referring Provider (PT): Frann Rider, FNP   Encounter Date: 04/27/2020   PT End of Session - 04/27/20 1654    Visit Number 1    Number of Visits 9    Date for PT Re-Evaluation 06/08/20    PT Start Time 1400    PT Stop Time 1445    PT Time Calculation (min) 45 min    Equipment Utilized During Treatment Gait belt    Activity Tolerance Patient tolerated treatment well    Behavior During Therapy [facility] for tasks assessed/performed           Past Medical History:  Diagnosis Date  . Avulsed toenail, initial encounter 05/04/2018  . Chronic gout    multiple sites--- followed by pcp--- (04-14-2018  per pt last flare-up , march 2019)  . Dyspnea on exertion 02/10/2020  . ED (erectile dysfunction)   . Epidermoid cyst of neck 10/16/2017  . Essential hypertension 07/21/2017  . Gout 01/19/2014  . Gout of multiple sites 07/21/2017  . History of kidney stones   . History of squamous cell carcinoma excision   . Hyperlipidemia   . Hypertension   . Hypertension not at goal 01/19/2014  . Left ureteral calculus   . Lower abdominal pain 08/27/2015  . Multiple joint pain 07/16/2018  . Pain in joint, shoulder region 12/11/2015  . Preventative health care 11/14/2016  . Urgency of urination   . URI (upper respiratory infection) 11/08/2014  . Wears glasses     Past Surgical History:  Procedure Laterality Date  . COLONOSCOPY WITH PROPOFOL  2017  . CYSTOSCOPY/RETROGRADE/URETEROSCOPY/STONE EXTRACTION WITH BASKET Left 04/16/2018   Procedure: CYSTOSCOPY/RETROGRADE/URETEROSCOPY/STONE EXTRACTION WITH BASKET/ HOLMIUM LASER LITHOTRIPSY/ STENT PLACEMENT;  Surgeon: Ardis Hughs, MD;  Location: [facility];  Service: Urology;   Laterality: Left;  . HOLMIUM LASER APPLICATION Left 9/93/7169   Procedure: HOLMIUM LASER APPLICATION;  Surgeon: Ardis Hughs, MD;  Location: [facility];  Service: Urology;  Laterality: Left;    There were no vitals filed for this visit.    Subjective Assessment - 04/27/20 1637    Subjective Pt reported on he saw orthopedic MD on 03/19/20 for L hip pain.03/21/20 he was doing some exercises recommended by his orthopedic physician and started noticing numbness in R leg and R am and R face. Symptoms persisted and he went to ED next day. MRI revealed that he had a small stroke. He was in ED over the weekend and when discharged from hospital, he was able to return back to work following Monday. Pt reports since the stroke, he feels more tired during the middle of the day. Because of the tiredness, he goes to school in the morning and works from home in the afternoon. Pt reports that when he returned home, he started having pain in R peiscapular region that travelled down on R side and R rib pain that wrapped around his R lower thorac from front to back. He reports rib pain is sharp with movement of arm. Pt reports he has gout flare up since being on new medication for his heart which is limiting his ability to walk.    Pertinent History past medical history of chronic arthritis, essential hypertension somewhat difficult to control on 4 medications at home, gout,  hyperlipidemia,    Limitations Walking;Standing    How long can you sit comfortably? no issues    How long can you stand comfortably? 30 min    How long can you walk comfortably? 20 min    Diagnostic tests MRI was performed that confirmed a left thalamic lacunar infarct.    Patient Stated Goals be able to walk 45 min a day, improve R shoulder and rib pain    Currently in Pain? Yes    Pain Score 8     Pain Location Rib cage    Pain Orientation Right    Pain Descriptors / Indicators Sharp    Pain Type Acute pain    Pain  Onset 1 to 4 weeks ago    Pain Frequency Intermittent    Aggravating Factors  reaching forward with R arm, reaching across body with R arm    Pain Relieving Factors changing positions    Effect of Pain on Daily Activities diffulty with reaching, pushing, pulling, carrying    Multiple Pain Sites Yes    Pain Score 6    Pain Location Other (Comment)   Persicapular region   Pain Orientation Right    Pain Descriptors / Indicators Aching;Radiating;Tightness    Pain Type Acute pain    Pain Onset 1 to 4 weeks ago    Pain Frequency Constant              [facility] PT Assessment - 04/27/20 1634      Assessment   Medical Diagnosis CVA    Referring Provider (PT) Frann Rider, FNP    Onset Date/Surgical Date 03/22/20    Hand Dominance Right    Prior Therapy none      Precautions   Precautions None      Restrictions   Weight Bearing Restrictions No      Balance Screen   Has the patient fallen in the past 6 months No      [place] residence    Available Help at Discharge Family    Home Access --      Prior Function   Level of Independence Independent    Vocation Full time employment    Chartered certified accountant in school      Cognition   Overall Cognitive Status Within Functional Limits for tasks assessed    Attention Focused    Focused Attention Appears intact    Awareness Appears intact    Problem Solving Appears intact      Observation/Other Assessments   Focus on Therapeutic Outcomes (FOTO)  59      ROM / Strength   AROM / PROM / Strength AROM;Strength      AROM   Overall AROM  Within functional limits for tasks performed    Overall AROM Comments Cervical, lumbar, R shoulder, L hip      Strength   Overall Strength Within functional limits for tasks performed    Overall Strength Comments Bil shoulders, bil hips, bil knees, bil ankles      Ambulation/Gait   Ambulation/Gait Yes    Ambulation/Gait Assistance 7: Independent     Ambulation Distance (Feet) 115 Feet    Assistive device None    Gait Pattern Within Functional Limits    Ambulation Surface Level      Standardized Balance Assessment   Standardized Balance Assessment Berg Balance Test;Dynamic Gait Index      Berg Balance Test   Sit to Stand Able to stand  without using hands and stabilize independently    Standing Unsupported Able to stand safely 2 minutes    Sitting with Back Unsupported but Feet Supported on Floor or Stool Able to sit safely and securely 2 minutes    Stand to Sit Sits safely with minimal use of hands    Transfers Able to transfer safely, minor use of hands    Standing Unsupported with Eyes Closed Able to stand 10 seconds safely    Standing Unsupported with Feet Together Able to place feet together independently and stand 1 minute safely    From Standing, Reach Forward with Outstretched Arm Can reach confidently >25 cm (10")    From Standing Position, Pick up Object from Floor Able to pick up shoe safely and easily    From Standing Position, Turn to Look Behind Over each Shoulder Looks behind from both sides and weight shifts well    Turn 360 Degrees Able to turn 360 degrees safely in 4 seconds or less    Standing Unsupported, Alternately Place Feet on Step/Stool Able to stand independently and safely and complete 8 steps in 20 seconds    Standing Unsupported, One Foot in Front Able to place foot tandem independently and hold 30 seconds    Standing on One Leg Able to lift leg independently and hold 5-10 seconds    Total Score 55      Dynamic Gait Index   Level Surface Normal    Change in Gait Speed Normal    Gait with Horizontal Head Turns Mild Impairment    Gait with Vertical Head Turns Mild Impairment    Gait and Pivot Turn Normal    Step Over Obstacle Normal    Step Around Obstacles Normal    Steps Normal    Total Score 22                      Objective measurements completed on examination: See above  findings.               PT Education - 04/27/20 1642    Education Details Educated on symptoms expectations after stroke, pt educated on evaluation findings and reviewed HEP    Person(s) Educated Patient    Methods Explanation    Comprehension Verbalized understanding            PT Short Term Goals - 04/27/20 1642      PT SHORT TERM GOAL #1   Title Patient will score 24/24 on DGI to improve dynamic gait in 3 weeks    Baseline 22/24 (eval)    Time 2    Period Weeks    Status New    Target Date 05/11/20      PT SHORT TERM GOAL #2   Title Patient will be able to stand on one leg for 10 sec to improve overall balance.    Baseline 6-8 sec bil (eval)    Time 2    Period Weeks    Status New    Target Date 05/11/20      PT SHORT TERM GOAL #3   Title Pt will report <6/10 pain in R ribs with forward reaching with R arm to improve function at home and work    Baseline 8/10 (eval)    Time 2    Period Weeks    Status New    Target Date 05/11/20             PT Long Term Goals -  04/27/20 1648      PT LONG TERM GOAL #1   Title Pt will report <2/10 pain intermittently, in R peiscapular region with daily functioning    Baseline 6/10 and constant (eval)    Time 6    Period Weeks    Status New    Target Date 06/08/20      PT LONG TERM GOAL #2   Title Patient will report <3/10 pain in R ribs with functional reaching, pushing/pulling grocery cart to improve function    Baseline 8/10    Time 6    Period Weeks    Status New    Target Date 06/08/20      PT LONG TERM GOAL #3   Title Pt will report score of 80 or higher on FOTO to improve therapeutic outcomes.    Baseline 59    Time 6    Period Weeks    Status New    Target Date 06/08/20                  Plan - 04/27/20 1649    Clinical Impression Statement Patient is a 69 y.o. male who was seen for physical therapy evaluation and treatment for gait and balance disorder after CVA, R  shoulder/periscapular pain, L hip pain and R rib pain. Patient demonstrates mild impairments with his static and dynamic balance according to Berg balance scale and dynamic gait index. Patient's most significant concern his R shoulder pain/periscapular pain, R sided rib pain and L hip pain as it limits his function with work and home duties. Patient will benefit from skilled PT to address these impairments and improve overall function.    Personal Factors and Comorbidities Comorbidity 3+    Comorbidities past medical history of chronic arthritis, essential hypertension somewhat difficult to control on 4 medications at home, gout, hyperlipidemia,    Examination-Activity Limitations Carry;Lift;Reach Overhead;Sleep;Locomotion Level    Examination-Participation Restrictions Cleaning;Community Activity;Driving;Laundry;Yard Work    Stability/Clinical Decision Making Evolving/Moderate complexity    Clinical Decision Making Moderate    Rehab Potential Good    PT Frequency 2x / week   2x/week for first 2 weeks and then 1x/week thereafter.   PT Duration 6 weeks    PT Treatment/Interventions ADLs/Self Care Home Management;Moist Heat;Gait training;Stair training;Functional mobility training;Therapeutic activities;Therapeutic exercise;Balance training;Neuromuscular re-education;Patient/family education;Manual techniques    PT Next Visit Plan Manual therapy for R shoulder pain, R rib pain, issue HEP    PT Home Exercise Plan Work on walking program (pt educated to walk 5-10 min as long as walking doesn't irritate his gout) and then slowly build up his walking endurane, 1st rib self mobilization with scalene stretch           Patient will benefit from skilled therapeutic intervention in order to improve the following deficits and impairments:  Abnormal gait, Decreased activity tolerance, Decreased endurance, Difficulty walking, Pain  Visit Diagnosis: Hemiplegia and hemiparesis following cerebral infarction  affecting right dominant side (HCC)  Unsteadiness on feet  Acute pain of right shoulder  Pain in left hip  Rib pain on right side     Problem List Patient Active Problem List   Diagnosis Date Noted  . Acute ischemic stroke ([place]) 03/22/2020  . It band syndrome, left 03/19/2020  . Metatarsalgia of both feet 03/19/2020  . 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 Dyspnea on exertion 02/10/2020  . Multiple joint pain 07/16/2018  . Avulsed toenail, initial encounter 05/04/2018  . Epidermoid cyst of neck  10/16/2017  . Essential hypertension 07/21/2017  . Gout of multiple sites 07/21/2017  . Preventative health care 11/14/2016  . Pain in joint, shoulder region 12/11/2015  . Lower abdominal pain 08/27/2015  . URI (upper respiratory infection) 11/08/2014  . Gout 01/19/2014  . Hypertension not at goal 01/19/2014    Kerrie Pleasure 04/27/2020, 4:55 PM  [facility] [address] [place], [place], [address] Phone: [phone]   Fax:  [phone]  Name: TEJON GRACIE MRN: [id number] Date of Birth: 03-Sep-1951

## 2020-04-30 ENCOUNTER — Ambulatory Visit: Payer: Medicare HMO

## 2020-04-30 ENCOUNTER — Other Ambulatory Visit: Payer: Self-pay

## 2020-04-30 DIAGNOSIS — M25511 Pain in right shoulder: Secondary | ICD-10-CM

## 2020-04-30 DIAGNOSIS — I69351 Hemiplegia and hemiparesis following cerebral infarction affecting right dominant side: Secondary | ICD-10-CM

## 2020-04-30 DIAGNOSIS — R0781 Pleurodynia: Secondary | ICD-10-CM

## 2020-04-30 DIAGNOSIS — R2681 Unsteadiness on feet: Secondary | ICD-10-CM

## 2020-04-30 DIAGNOSIS — M25552 Pain in left hip: Secondary | ICD-10-CM | POA: Diagnosis not present

## 2020-04-30 NOTE — Progress Notes (Signed)
I agree with the above plan 

## 2020-04-30 NOTE — Therapy (Signed)
[facility] [address] [place], [place], [address] Phone: [phone]   Fax:  [phone]  Physical Therapy Treatment  Patient Details  Name: Thomas [name] MRN: [id number] Date of Birth: Dec 29, 1950 Referring Provider (PT): Frann Rider, FNP   Encounter Date: 04/30/2020   PT End of Session - 04/30/20 1817    Visit Number 2    Number of Visits 9    Date for PT Re-Evaluation 06/08/20    PT Start Time 6962    PT Stop Time 1615    PT Time Calculation (min) 45 min    Equipment Utilized During Treatment Gait belt    Activity Tolerance Patient tolerated treatment well    Behavior During Therapy [facility] for tasks assessed/performed           Past Medical History:  Diagnosis Date  . Avulsed toenail, initial encounter 05/04/2018  . Chronic gout    multiple sites--- followed by pcp--- (04-14-2018  per pt last flare-up , march 2019)  . Dyspnea on exertion 02/10/2020  . ED (erectile dysfunction)   . Epidermoid cyst of neck 10/16/2017  . Essential hypertension 07/21/2017  . Gout 01/19/2014  . Gout of multiple sites 07/21/2017  . History of kidney stones   . History of squamous cell carcinoma excision   . Hyperlipidemia   . Hypertension   . Hypertension not at goal 01/19/2014  . Left ureteral calculus   . Lower abdominal pain 08/27/2015  . Multiple joint pain 07/16/2018  . Pain in joint, shoulder region 12/11/2015  . Preventative health care 11/14/2016  . Urgency of urination   . URI (upper respiratory infection) 11/08/2014  . Wears glasses     Past Surgical History:  Procedure Laterality Date  . COLONOSCOPY WITH PROPOFOL  2017  . CYSTOSCOPY/RETROGRADE/URETEROSCOPY/STONE EXTRACTION WITH BASKET Left 04/16/2018   Procedure: CYSTOSCOPY/RETROGRADE/URETEROSCOPY/STONE EXTRACTION WITH BASKET/ HOLMIUM LASER LITHOTRIPSY/ STENT PLACEMENT;  Surgeon: Ardis Hughs, MD;  Location: [facility];  Service: Urology;   Laterality: Left;  . HOLMIUM LASER APPLICATION Left 9/52/8413   Procedure: HOLMIUM LASER APPLICATION;  Surgeon: Ardis Hughs, MD;  Location: [facility];  Service: Urology;  Laterality: Left;    There were no vitals filed for this visit.   Subjective Assessment - 04/30/20 1541    Subjective Pt reports of constant pain in ribs 3/10 and in periscapular region. Pt reports periscapular pain may be little better.    Pertinent History past medical history of chronic arthritis, essential hypertension somewhat difficult to control on 4 medications at home, gout, hyperlipidemia,    Limitations Walking;Standing    How long can you sit comfortably? no issues    How long can you stand comfortably? 30 min    How long can you walk comfortably? 20 min    Diagnostic tests MRI was performed that confirmed a left thalamic lacunar infarct.    Patient Stated Goals be able to walk 45 min a day, improve R shoulder and rib pain    Pain Onset 1 to 4 weeks ago    Pain Onset 1 to 4 weeks ago                Manual therapy: Unilateral and central PA mobilization in upper cervical spine in sitting 1st rib and CT junction lateral mobilization in sitting Myofascial release to lateral R rib cage border with function UE flexion and lower trunk rotations to left  TherEx: SciFit: 6' Movement with mobilization: upper  cervical rotation with mobilization with towel with R rotation: 10x Reviewed self 1st rib mobilization from last session. Self thoracic extension mobilization in mid thoracic spine: 3 x 2' Seated thoracic twists: 2 x 5     Current HEP R 1st rib self mobilization Upper cervical SNAG with towel Self thoracic extension mobilization over towel roll in supine                 PT Education - 04/30/20 1817    Education Details Educated on updated HEP    Person(s) Educated Patient    Methods Explanation    Comprehension Verbalized understanding             PT Short Term Goals - 04/30/20 1542      PT SHORT TERM GOAL #1   Title Patient will score 24/24 on DGI to improve dynamic gait in 3 weeks    Baseline 22/24 (eval)    Time 2    Period Weeks    Status New    Target Date 05/11/20      PT SHORT TERM GOAL #2   Title Patient will be able to stand on one leg for 10 sec to improve overall balance.    Baseline 6-8 sec bil (eval)    Time 2    Period Weeks    Status New    Target Date 05/11/20      PT SHORT TERM GOAL #3   Title Pt will report <6/10 pain in R ribs with forward reaching with R arm to improve function at home and work    Baseline 8/10 (eval)    Time 2    Period Weeks    Status New    Target Date 05/11/20             PT Long Term Goals - 04/27/20 1648      PT LONG TERM GOAL #1   Title Pt will report <2/10 pain intermittently, in R peiscapular region with daily functioning    Baseline 6/10 and constant (eval)    Time 6    Period Weeks    Status New    Target Date 06/08/20      PT LONG TERM GOAL #2   Title Patient will report <3/10 pain in R ribs with functional reaching, pushing/pulling grocery cart to improve function    Baseline 8/10    Time 6    Period Weeks    Status New    Target Date 06/08/20      PT LONG TERM GOAL #3   Title Pt will report score of 80 or higher on FOTO to improve therapeutic outcomes.    Baseline 59    Time 6    Period Weeks    Status New    Target Date 06/08/20                 Plan - 04/30/20 1816    Clinical Impression Statement Pt demonstrated decreased pain and improving cervical rotation to R and cervical extension with manual therapy and self cervical and thoracic mobilization. Pt reported decreasing pain in R lower lateral ribs with forward and diagonal reaching after manual therapy.    Personal Factors and Comorbidities Comorbidity 3+    Comorbidities past medical history of chronic arthritis, essential hypertension somewhat difficult to control on 4 medications at  home, gout, hyperlipidemia,    Examination-Activity Limitations Carry;Lift;Reach Overhead;Sleep;Locomotion Level    Examination-Participation Restrictions Cleaning;Community Activity;Driving;Laundry;Yard Work    Risk analyst  Making Evolving/Moderate complexity    Rehab Potential Good    PT Frequency 2x / week   2x/week for first 2 weeks and then 1x/week thereafter.   PT Duration 6 weeks    PT Treatment/Interventions ADLs/Self Care Home Management;Moist Heat;Gait training;Stair training;Functional mobility training;Therapeutic activities;Therapeutic exercise;Balance training;Neuromuscular re-education;Patient/family education;Manual techniques    PT Next Visit Plan Manual therapy for R shoulder pain, R rib pain, issue HEP    PT Home Exercise Plan Work on walking program (pt educated to walk 5-10 min as long as walking doesn't irritate his gout) and then slowly build up his walking endurane, 1st rib self mobilization with scalene stretch           Patient will benefit from skilled therapeutic intervention in order to improve the following deficits and impairments:  Abnormal gait, Decreased activity tolerance, Decreased endurance, Difficulty walking, Pain  Visit Diagnosis: Unsteadiness on feet  Acute pain of right shoulder  Pain in left hip  Hemiplegia and hemiparesis following cerebral infarction affecting right dominant side (HCC)  Rib pain on right side     Problem List Patient Active Problem List   Diagnosis Date Noted  . Acute ischemic stroke ([place]) 03/22/2020  . It band syndrome, left 03/19/2020  . Metatarsalgia of both feet 03/19/2020  . 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 Dyspnea on exertion 02/10/2020  . Multiple joint pain 07/16/2018  . Avulsed toenail, initial encounter 05/04/2018  . Epidermoid cyst of neck 10/16/2017  . Essential hypertension 07/21/2017  . Gout of multiple sites 07/21/2017  . Preventative health care 11/14/2016    . Pain in joint, shoulder region 12/11/2015  . Lower abdominal pain 08/27/2015  . URI (upper respiratory infection) 11/08/2014  . Gout 01/19/2014  . Hypertension not at goal 01/19/2014    Kerrie Pleasure 04/30/2020, 6:20 PM  [facility] [address] [place], [place], [address] Phone: [phone]   Fax:  [phone]  Name: Thomas [name] MRN: [id number] Date of Birth: May 21, 1951

## 2020-05-02 ENCOUNTER — Telehealth: Payer: Self-pay

## 2020-05-02 MED ORDER — MINOXIDIL 2.5 MG PO TABS: 2.5000 mg | ORAL_TABLET | Freq: Two times a day (BID) | ORAL | 1 refills | Status: DC

## 2020-05-02 NOTE — Telephone Encounter (Signed)
Refill sent in per faxed request 

## 2020-05-03 ENCOUNTER — Other Ambulatory Visit: Payer: Self-pay | Admitting: Cardiology

## 2020-05-03 ENCOUNTER — Telehealth: Payer: Self-pay | Admitting: Cardiology

## 2020-05-03 DIAGNOSIS — I10 Essential (primary) hypertension: Secondary | ICD-10-CM

## 2020-05-03 NOTE — Telephone Encounter (Signed)
Pt c/o medication issue:  1. Name of Medication: hydrochlorothiazide (HYDRODIURIL) 25 MG tablet  2. How are you currently taking this medication (dosage and times per day)? As written  3. Are you having a reaction (difficulty breathing--STAT)? No   4. What is your medication issue? Problem with medication. Causes flare-ups in gout. Tried to contact Dr. Agustin Cree a couple of times to try to change medication and hasn't received no response. Need change ASAP if possible

## 2020-05-03 NOTE — Telephone Encounter (Signed)
If this is the case please discontinue his hydrochlorothiazide.  Please find out how he is doing with his blood pressure without it.

## 2020-05-03 NOTE — Telephone Encounter (Signed)
Call patient advised him to stop hydrochlorothiazide per Dr. Agustin Cree patient verbally understood however during the call he was wondering if Dr. Agustin Cree would like to add on another diuretic or blood pressure medicine instead of taking him off of hydrochlorothiazide and having no other treatment.   During the call patient also explains to me his issues with getting his pain management for gout treated his new primary care doctor will not really help him with this he wants to know if he could take ibuprofen to try to help with pain even though he does take aspirin as well he wanted me to ask Dr. Agustin Cree if he could help advise any on the pain management aspect of things since he can't seem to get help from his new primary care doctor. Will consult with Dr. Agustin Cree and let patient know. Patient advised to check blood pressure daily and let us know if he notices the increase since coming off hydrochlorothiazide patient verbally understood no further questions

## 2020-05-04 ENCOUNTER — Other Ambulatory Visit: Payer: Self-pay

## 2020-05-04 ENCOUNTER — Ambulatory Visit: Payer: Medicare HMO

## 2020-05-04 DIAGNOSIS — R2681 Unsteadiness on feet: Secondary | ICD-10-CM | POA: Diagnosis not present

## 2020-05-04 DIAGNOSIS — I69351 Hemiplegia and hemiparesis following cerebral infarction affecting right dominant side: Secondary | ICD-10-CM

## 2020-05-04 DIAGNOSIS — R0781 Pleurodynia: Secondary | ICD-10-CM

## 2020-05-04 DIAGNOSIS — M25511 Pain in right shoulder: Secondary | ICD-10-CM | POA: Diagnosis not present

## 2020-05-04 DIAGNOSIS — M25552 Pain in left hip: Secondary | ICD-10-CM | POA: Diagnosis not present

## 2020-05-04 NOTE — Telephone Encounter (Signed)
Call transferred to [facility].

## 2020-05-04 NOTE — Telephone Encounter (Signed)
Patient called back.Informed him per Dr. Agustin Cree to stop the hydrochlorothiazide and that we will not add on a new medication at this time, the patient was also informed that he can take ibuprofen to help with his pain with gout but long term management for that does need to go through his primary physician, patient verbally understood no further questions.

## 2020-05-04 NOTE — Therapy (Signed)
[facility] [address] [place], [place], [address] Phone: [phone]   Fax:  [phone]  Physical Therapy Treatment  Patient Details  Name: Thomas [name] MRN: [id number] Date of Birth: [birth year] Referring Provider (PT): Frann Rider, FNP   Encounter Date: 05/04/2020   PT End of Session - 05/04/20 1541    Visit Number 3    Number of Visits 9    Date for PT Re-Evaluation 06/08/20    PT Start Time 9563    PT Stop Time 1530    PT Time Calculation (min) 45 min    Equipment Utilized During Treatment Gait belt    Activity Tolerance Patient tolerated treatment well    Behavior During Therapy [facility] for tasks assessed/performed           Past Medical History:  Diagnosis Date  . Avulsed toenail, initial encounter 05/04/2018  . Chronic gout    multiple sites--- followed by pcp--- (04-14-2018  per pt last flare-up , march 2019)  . Dyspnea on exertion 02/10/2020  . ED (erectile dysfunction)   . Epidermoid cyst of neck 10/16/2017  . Essential hypertension 07/21/2017  . Gout 01/19/2014  . Gout of multiple sites 07/21/2017  . History of kidney stones   . History of squamous cell carcinoma excision   . Hyperlipidemia   . Hypertension   . Hypertension not at goal 01/19/2014  . Left ureteral calculus   . Lower abdominal pain 08/27/2015  . Multiple joint pain 07/16/2018  . Pain in joint, shoulder region 12/11/2015  . Preventative health care 11/14/2016  . Urgency of urination   . URI (upper respiratory infection) 11/08/2014  . Wears glasses     Past Surgical History:  Procedure Laterality Date  . COLONOSCOPY WITH PROPOFOL  2017  . CYSTOSCOPY/RETROGRADE/URETEROSCOPY/STONE EXTRACTION WITH BASKET Left 04/16/2018   Procedure: CYSTOSCOPY/RETROGRADE/URETEROSCOPY/STONE EXTRACTION WITH BASKET/ HOLMIUM LASER LITHOTRIPSY/ STENT PLACEMENT;  Surgeon: Ardis Hughs, MD;  Location: [facility];  Service: Urology;   Laterality: Left;  . HOLMIUM LASER APPLICATION Left 8/75/6433   Procedure: HOLMIUM LASER APPLICATION;  Surgeon: Ardis Hughs, MD;  Location: [facility];  Service: Urology;  Laterality: Left;    There were no vitals filed for this visit.   Subjective Assessment - 05/04/20 1539    Subjective Pt reports pain in ribs is improivng. Currently no pain with rest but feels light discomfort with reaching forward. Pt reports tightness at R side of base of neck.    Pertinent History past medical history of chronic arthritis, essential hypertension somewhat difficult to control on 4 medications at home, gout, hyperlipidemia,    Limitations Walking;Standing    How long can you sit comfortably? no issues    How long can you stand comfortably? 30 min    How long can you walk comfortably? 20 min    Diagnostic tests MRI was performed that confirmed a left thalamic lacunar infarct.    Patient Stated Goals be able to walk 45 min a day, improve R shoulder and rib pain    Currently in Pain? Yes    Pain Score 2     Pain Location Neck                 Manual therapy: Unilateral and central PA mobilization in upper cervical spine in sitting 1st rib and CT junction lateral mobilization in sitting Myofascial release to lateral R rib cage border , R lateral iliac crest border Grade IV  PA mobilization at T11-12  TherEx: Reviewed HEP verbally Self thoracic and rib mobilization with patient lying on left side and performs forward reaching: 20x Seated thoracic twists: 10x   Current HEP R 1st rib self mobilization Upper cervical SNAG with towel Self thoracic extension mobilization over towel roll in supine                             PT Short Term Goals - 04/30/20 1542      PT SHORT TERM GOAL #1   Title Patient will score 24/24 on DGI to improve dynamic gait in 3 weeks    Baseline 22/24 (eval)    Time 2    Period Weeks    Status New    Target Date  05/11/20      PT SHORT TERM GOAL #2   Title Patient will be able to stand on one leg for 10 sec to improve overall balance.    Baseline 6-8 sec bil (eval)    Time 2    Period Weeks    Status New    Target Date 05/11/20      PT SHORT TERM GOAL #3   Title Pt will report <6/10 pain in R ribs with forward reaching with R arm to improve function at home and work    Baseline 8/10 (eval)    Time 2    Period Weeks    Status New    Target Date 05/11/20             PT Long Term Goals - 04/27/20 1648      PT LONG TERM GOAL #1   Title Pt will report <2/10 pain intermittently, in R peiscapular region with daily functioning    Baseline 6/10 and constant (eval)    Time 6    Period Weeks    Status New    Target Date 06/08/20      PT LONG TERM GOAL #2   Title Patient will report <3/10 pain in R ribs with functional reaching, pushing/pulling grocery cart to improve function    Baseline 8/10    Time 6    Period Weeks    Status New    Target Date 06/08/20      PT LONG TERM GOAL #3   Title Pt will report score of 80 or higher on FOTO to improve therapeutic outcomes.    Baseline 59    Time 6    Period Weeks    Status New    Target Date 06/08/20                 Plan - 05/04/20 1539    Clinical Impression Statement Patient reported no pain in his neck with cervical rotations and extension at end of the session. Patient has myofascial restrictions in lateral trunk which was provoking patient's pain. Pt reported improved pain after myofascial restrictions and T11-12 mobilization in thoracic spine.    Personal Factors and Comorbidities Comorbidity 3+    Comorbidities past medical history of chronic arthritis, essential hypertension somewhat difficult to control on 4 medications at home, gout, hyperlipidemia,    Examination-Activity Limitations Carry;Lift;Reach Overhead;Sleep;Locomotion Level    Examination-Participation Restrictions Cleaning;Community Activity;Driving;Laundry;Yard  Work    Stability/Clinical Decision Making Evolving/Moderate complexity    Rehab Potential Good    PT Frequency 2x / week   2x/week for first 2 weeks and then 1x/week thereafter.   PT Duration 6 weeks  PT Treatment/Interventions ADLs/Self Care Home Management;Moist Heat;Gait training;Stair training;Functional mobility training;Therapeutic activities;Therapeutic exercise;Balance training;Neuromuscular re-education;Patient/family education;Manual techniques    PT Next Visit Plan Manual therapy for R shoulder pain, R rib pain, issue HEP    PT Home Exercise Plan Work on walking program (pt educated to walk 5-10 min as long as walking doesn't irritate his gout) and then slowly build up his walking endurane, 1st rib self mobilization with scalene stretch           Patient will benefit from skilled therapeutic intervention in order to improve the following deficits and impairments:  Abnormal gait, Decreased activity tolerance, Decreased endurance, Difficulty walking, Pain  Visit Diagnosis: Unsteadiness on feet  Acute pain of right shoulder  Pain in left hip  Hemiplegia and hemiparesis following cerebral infarction affecting right dominant side (HCC)  Rib pain on right side     Problem List Patient Active Problem List   Diagnosis Date Noted  . Acute ischemic stroke ([place]) 03/22/2020  . It band syndrome, left 03/19/2020  . Metatarsalgia of both feet 03/19/2020  . 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 Dyspnea on exertion 02/10/2020  . Multiple joint pain 07/16/2018  . Avulsed toenail, initial encounter 05/04/2018  . Epidermoid cyst of neck 10/16/2017  . Essential hypertension 07/21/2017  . Gout of multiple sites 07/21/2017  . Preventative health care 11/14/2016  . Pain in joint, shoulder region 12/11/2015  . Lower abdominal pain 08/27/2015  . URI (upper respiratory infection) 11/08/2014  . Gout 01/19/2014  . Hypertension not at goal 01/19/2014    Kerrie Pleasure 05/04/2020, 3:41 PM  [facility] [address] [place], [place], [address] Phone: [phone]   Fax:  [phone]  Name: Thomas [name] MRN: [id number] Date of Birth: Mar 18, 1951

## 2020-05-04 NOTE — Telephone Encounter (Signed)
Left message for patient to return call.

## 2020-05-07 ENCOUNTER — Ambulatory Visit: Payer: Medicare HMO

## 2020-05-07 ENCOUNTER — Other Ambulatory Visit: Payer: Self-pay

## 2020-05-07 DIAGNOSIS — I69351 Hemiplegia and hemiparesis following cerebral infarction affecting right dominant side: Secondary | ICD-10-CM | POA: Diagnosis not present

## 2020-05-07 DIAGNOSIS — M25552 Pain in left hip: Secondary | ICD-10-CM | POA: Diagnosis not present

## 2020-05-07 DIAGNOSIS — R0781 Pleurodynia: Secondary | ICD-10-CM | POA: Diagnosis not present

## 2020-05-07 DIAGNOSIS — M25511 Pain in right shoulder: Secondary | ICD-10-CM | POA: Diagnosis not present

## 2020-05-07 DIAGNOSIS — R2681 Unsteadiness on feet: Secondary | ICD-10-CM | POA: Diagnosis not present

## 2020-05-07 NOTE — Therapy (Addendum)
[facility] [address] [place], [place], [address] Phone: [phone]   Fax:  [phone]  Physical Therapy Treatment  Patient Details  Name: Thomas [name] MRN: [id number] Date of Birth: Sep 08, 1951 Referring Provider (PT): Frann Rider, FNP   Encounter Date: 05/07/2020   PT End of Session - 05/07/20 1446    Visit Number 4    Number of Visits 9    Date for PT Re-Evaluation 06/08/20    PT Start Time 1410    PT Stop Time 1450    PT Time Calculation (min) 40 min    Equipment Utilized During Treatment Gait belt    Activity Tolerance Patient tolerated treatment well    Behavior During Therapy [facility] for tasks assessed/performed           Past Medical History:  Diagnosis Date  . Avulsed toenail, initial encounter 05/04/2018  . Chronic gout    multiple sites--- followed by pcp--- (04-14-2018  per pt last flare-up , march 2019)  . Dyspnea on exertion 02/10/2020  . ED (erectile dysfunction)   . Epidermoid cyst of neck 10/16/2017  . Essential hypertension 07/21/2017  . Gout 01/19/2014  . Gout of multiple sites 07/21/2017  . History of kidney stones   . History of squamous cell carcinoma excision   . Hyperlipidemia   . Hypertension   . Hypertension not at goal 01/19/2014  . Left ureteral calculus   . Lower abdominal pain 08/27/2015  . Multiple joint pain 07/16/2018  . Pain in joint, shoulder region 12/11/2015  . Preventative health care 11/14/2016  . Urgency of urination   . URI (upper respiratory infection) 11/08/2014  . Wears glasses     Past Surgical History:  Procedure Laterality Date  . COLONOSCOPY WITH PROPOFOL  2017  . CYSTOSCOPY/RETROGRADE/URETEROSCOPY/STONE EXTRACTION WITH BASKET Left 04/16/2018   Procedure: CYSTOSCOPY/RETROGRADE/URETEROSCOPY/STONE EXTRACTION WITH BASKET/ HOLMIUM LASER LITHOTRIPSY/ STENT PLACEMENT;  Surgeon: Ardis Hughs, MD;  Location: [facility];  Service: Urology;   Laterality: Left;  . HOLMIUM LASER APPLICATION Left 4/81/8563   Procedure: HOLMIUM LASER APPLICATION;  Surgeon: Ardis Hughs, MD;  Location: [facility];  Service: Urology;  Laterality: Left;    There were no vitals filed for this visit.   Subjective Assessment - 05/07/20 1434    Subjective pain is better, 3/10                   Grade IV PA mobilization throughout thoracic spine and T12-L1 area Grade IV costeoverterbral junction mobilization PA bil throughout thoracic spine Cat and camel: 2 x 10 Supine 1/2 foam roll thoracic mobilization: 5' at multiple different segments Supine 1/2 foam roll snow angels.10x         Current HEP R 1st rib self mobilization Upper cervical SNAG with towel Self thoracic extension mobilization over towel roll in supine     Access Code: [email or web address] URL: [email or web address] Date: 05/07/2020 Prepared by: [place] on Foam Roll - 1 x daily - 7 x weekly - 2 sets - 10 reps Cat-Camel - 1 x daily - 7 x weekly - 2 sets - 10 reps              PT Short Term Goals - 04/30/20 1542      PT SHORT TERM GOAL #1   Title Patient will score 24/24 on DGI to improve dynamic gait in 3 weeks    Baseline 22/24 (eval)  Time 2    Period Weeks    Status New    Target Date 05/11/20      PT SHORT TERM GOAL #2   Title Patient will be able to stand on one leg for 10 sec to improve overall balance.    Baseline 6-8 sec bil (eval)    Time 2    Period Weeks    Status New    Target Date 05/11/20      PT SHORT TERM GOAL #3   Title Pt will report <6/10 pain in R ribs with forward reaching with R arm to improve function at home and work    Baseline 8/10 (eval)    Time 2    Period Weeks    Status New    Target Date 05/11/20             PT Long Term Goals - 04/27/20 1648      PT LONG TERM GOAL #1   Title Pt will report <2/10 pain intermittently, in R peiscapular region  with daily functioning    Baseline 6/10 and constant (eval)    Time 6    Period Weeks    Status New    Target Date 06/08/20      PT LONG TERM GOAL #2   Title Patient will report <3/10 pain in R ribs with functional reaching, pushing/pulling grocery cart to improve function    Baseline 8/10    Time 6    Period Weeks    Status New    Target Date 06/08/20      PT LONG TERM GOAL #3   Title Pt will report score of 80 or higher on FOTO to improve therapeutic outcomes.    Baseline 59    Time 6    Period Weeks    Status New    Target Date 06/08/20                 Plan - 05/07/20 1445    Clinical Impression Statement Pt is reporting improivng pain in lateral ribs and R periscapular region. patient is compliant with HEP. Improved thoracic mobility noted with manual therapy and self thoracic mobilization.    Personal Factors and Comorbidities Comorbidity 3+    Comorbidities past medical history of chronic arthritis, essential hypertension somewhat difficult to control on 4 medications at home, gout, hyperlipidemia,    Examination-Activity Limitations Carry;Lift;Reach Overhead;Sleep;Locomotion Level    Examination-Participation Restrictions Cleaning;Community Activity;Driving;Laundry;Yard Work    Stability/Clinical Decision Making Evolving/Moderate complexity    Rehab Potential Good    PT Frequency 2x / week   2x/week for first 2 weeks and then 1x/week thereafter.   PT Duration 6 weeks    PT Treatment/Interventions ADLs/Self Care Home Management;Moist Heat;Gait training;Stair training;Functional mobility training;Therapeutic activities;Therapeutic exercise;Balance training;Neuromuscular re-education;Patient/family education;Manual techniques    PT Next Visit Plan Manual therapy for R shoulder pain, R rib pain, issue HEP    PT Home Exercise Plan Work on walking program (pt educated to walk 5-10 min as long as walking doesn't irritate his gout) and then slowly build up his walking  endurane, 1st rib self mobilization with scalene stretch           Patient will benefit from skilled therapeutic intervention in order to improve the following deficits and impairments:  Abnormal gait, Decreased activity tolerance, Decreased endurance, Difficulty walking, Pain  Visit Diagnosis: Unsteadiness on feet  Acute pain of right shoulder  Pain in left hip  Hemiplegia and hemiparesis following  cerebral infarction affecting right dominant side (HCC)  Rib pain on right side     Problem List Patient Active Problem List   Diagnosis Date Noted  . Acute ischemic stroke ([place]) 03/22/2020  . It band syndrome, left 03/19/2020  . Metatarsalgia of both feet 03/19/2020  . 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 Dyspnea on exertion 02/10/2020  . Multiple joint pain 07/16/2018  . Avulsed toenail, initial encounter 05/04/2018  . Epidermoid cyst of neck 10/16/2017  . Essential hypertension 07/21/2017  . Gout of multiple sites 07/21/2017  . Preventative health care 11/14/2016  . Pain in joint, shoulder region 12/11/2015  . Lower abdominal pain 08/27/2015  . URI (upper respiratory infection) 11/08/2014  . Gout 01/19/2014  . Hypertension not at goal 01/19/2014    Kerrie Pleasure, PT 05/07/2020, 2:48 PM  [facility] [address] [place], [place], [address] Phone: [phone]   Fax:  [phone]  Name: Thomas [name] MRN: [id number] Date of Birth: February 25, 1951

## 2020-05-10 ENCOUNTER — Ambulatory Visit: Payer: Medicare HMO

## 2020-05-11 DIAGNOSIS — Z6829 Body mass index (BMI) 29.0-29.9, adult: Secondary | ICD-10-CM | POA: Diagnosis not present

## 2020-05-11 DIAGNOSIS — M2550 Pain in unspecified joint: Secondary | ICD-10-CM | POA: Diagnosis not present

## 2020-05-11 DIAGNOSIS — M150 Primary generalized (osteo)arthritis: Secondary | ICD-10-CM | POA: Diagnosis not present

## 2020-05-11 DIAGNOSIS — M1A09X0 Idiopathic chronic gout, multiple sites, without tophus (tophi): Secondary | ICD-10-CM | POA: Diagnosis not present

## 2020-05-11 DIAGNOSIS — E663 Overweight: Secondary | ICD-10-CM | POA: Diagnosis not present

## 2020-05-12 DIAGNOSIS — Z79899 Other long term (current) drug therapy: Secondary | ICD-10-CM

## 2020-05-25 ENCOUNTER — Ambulatory Visit: Payer: Medicare HMO | Admitting: Cardiology

## 2020-05-28 ENCOUNTER — Encounter: Payer: Self-pay | Admitting: Neurology

## 2020-05-28 ENCOUNTER — Other Ambulatory Visit: Payer: Self-pay

## 2020-05-28 ENCOUNTER — Ambulatory Visit (INDEPENDENT_AMBULATORY_CARE_PROVIDER_SITE_OTHER): Payer: Medicare HMO | Admitting: Neurology

## 2020-05-28 VITALS — BP 156/80 | HR 72 | Ht 70.0 in | Wt 215.3 lb

## 2020-05-28 DIAGNOSIS — R0683 Snoring: Secondary | ICD-10-CM | POA: Diagnosis not present

## 2020-05-28 DIAGNOSIS — R0681 Apnea, not elsewhere classified: Secondary | ICD-10-CM

## 2020-05-28 DIAGNOSIS — I639 Cerebral infarction, unspecified: Secondary | ICD-10-CM | POA: Diagnosis not present

## 2020-05-28 DIAGNOSIS — I10 Essential (primary) hypertension: Secondary | ICD-10-CM | POA: Diagnosis not present

## 2020-05-28 DIAGNOSIS — E669 Obesity, unspecified: Secondary | ICD-10-CM

## 2020-05-28 DIAGNOSIS — Z820 Family history of epilepsy and other diseases of the nervous system: Secondary | ICD-10-CM

## 2020-05-28 DIAGNOSIS — R351 Nocturia: Secondary | ICD-10-CM

## 2020-05-28 DIAGNOSIS — I6381 Other cerebral infarction due to occlusion or stenosis of small artery: Secondary | ICD-10-CM

## 2020-05-28 NOTE — Progress Notes (Signed)
Subjective:    Patient ID: Thomas [name] is a 69 y.o. male.  HPI     Star Age, MD, PhD [facility] [address], [address] [place], [place] [address]  Dear Janett Billow,   I saw your patient, Thomas [name], upon your kind request, in my Sleep clinic today for initial consultation of his sleep disorder, in particular, concern for underlying obstructive sleep apnea.  The patient is unaccompanied today.  As you know, Thomas [name] is a 69 year old right-handed gentleman with an underlying medical history of hypertension, hyperlipidemia, left thalamic stroke, gout, kidney stones, squamous cell skin cancer, and borderline obesity, who reports snoring and witnessed apneas per wife's report.  He has had difficulty control hypertension for a few years.  He also suffers from chronic gout and intermittent flareup of his gout.  His father had sleep apnea and used a CPAP machine in his 13s.  I reviewed your office note from 04/24/2020.  His Epworth sleepiness score is 3 out of 24 today, fatigue severity score is 27 out of 63. Patient lives with his wife, he works as Engineer, site for a private school.  He smoked off and on in his 75s, quit 35 years ago.  He has 2 grown children, 73 year old son is in college, he has a 85 year old as well.  He has nocturia about 2-3 times per average night, denies recurrent morning headaches.  He has a bedtime of around 10, rise time around 5.  He drinks caffeine in the form of coffee, 2 cups/day on average in the mornings, no alcohol in the recent past.  He is still in physical therapy.  His strength is fairly good on the right side, still has some sensory symptoms particularly in the right leg. He is a side sleeper.  They do have a TV in the bedroom but he does not have to watch it at night, likes to read before falling asleep.  They have a dog in the household.  His Past Medical History Is Significant For: Past Medical History:  Diagnosis  Date  . Avulsed toenail, initial encounter 05/04/2018  . Chronic gout    multiple sites--- followed by pcp--- (04-14-2018  per pt last flare-up , march 2019)  . Dyspnea on exertion 02/10/2020  . ED (erectile dysfunction)   . Epidermoid cyst of neck 10/16/2017  . Essential hypertension 07/21/2017  . Gout 01/19/2014  . Gout of multiple sites 07/21/2017  . History of kidney stones   . History of squamous cell carcinoma excision   . Hyperlipidemia   . Hypertension   . Hypertension not at goal 01/19/2014  . Left ureteral calculus   . Lower abdominal pain 08/27/2015  . Multiple joint pain 07/16/2018  . Pain in joint, shoulder region 12/11/2015  . Preventative health care 11/14/2016  . Urgency of urination   . URI (upper respiratory infection) 11/08/2014  . Wears glasses     His Past Surgical History Is Significant For: Past Surgical History:  Procedure Laterality Date  . COLONOSCOPY WITH PROPOFOL  2017  . CYSTOSCOPY/RETROGRADE/URETEROSCOPY/STONE EXTRACTION WITH BASKET Left 04/16/2018   Procedure: CYSTOSCOPY/RETROGRADE/URETEROSCOPY/STONE EXTRACTION WITH BASKET/ HOLMIUM LASER LITHOTRIPSY/ STENT PLACEMENT;  Surgeon: Ardis Hughs, MD;  Location: [facility];  Service: Urology;  Laterality: Left;  . HOLMIUM LASER APPLICATION Left 4/33/2951   Procedure: HOLMIUM LASER APPLICATION;  Surgeon: Ardis Hughs, MD;  Location: [facility];  Service: Urology;  Laterality: Left;    His Family History  Is Significant For: Family History  Problem Relation Age of Onset  . Heart disease Mother 16  . Alcohol abuse Mother   . Depression Mother   . Cancer Sister        hodgkins, breast  . Hypertension Father   . Colon cancer Neg Hx   . Pancreatic cancer Neg Hx   . Rectal cancer Neg Hx   . Stomach cancer Neg Hx     His Social History Is Significant For: Social History   Socioeconomic History  . Marital status: Married    Spouse name: Not on file  . Number of  children: Not on file  . Years of education: Not on file  . Highest education level: Not on file  Occupational History  . Occupation: Pharmacist, hospital  Tobacco Use  . Smoking status: Former Smoker    Packs/day: 0.30    Years: 20.00    Pack years: 6.00    Types: Cigarettes    Quit date: 10/17/1994    Years since quitting: 25.6  . Smokeless tobacco: Never Used  Vaping Use  . Vaping Use: Never assessed  Substance and Sexual Activity  . Alcohol use: Yes    Alcohol/week: 2.0 standard drinks    Types: 2 Glasses of wine per week    Comment: rare  . Drug use: No  . Sexual activity: Yes  Other Topics Concern  . Not on file  Social History Narrative   Exercise 1 hour a day ---3-4 days a week   Social Determinants of Health   Financial Resource Strain:   . Difficulty of Paying Living Expenses:   Food Insecurity:   . Worried About Charity fundraiser in the Last Year:   . Arboriculturist in the Last Year:   Transportation Needs:   . Film/video editor (Medical):   Marland Kitchen Lack of Transportation (Non-Medical):   Physical Activity:   . Days of Exercise per Week:   . Minutes of Exercise per Session:   Stress:   . Feeling of Stress :   Social Connections:   . Frequency of Communication with Friends and Family:   . Frequency of Social Gatherings with Friends and Family:   . Attends Religious Services:   . Active Member of Clubs or Organizations:   . Attends Archivist Meetings:   Marland Kitchen Marital Status:     His Allergies Are:  No Known Allergies:   His Current Medications Are:  Outpatient Encounter Medications as of 05/28/2020  Medication Sig  . allopurinol (ZYLOPRIM) 300 MG tablet Take 300 mg by mouth daily.  Marland Kitchen b complex vitamins tablet Take 1 tablet by mouth daily.  . cloNIDine (CATAPRES) 0.1 MG tablet TAKE 1 TABLET BY MOUTH TWICE A DAY  . colchicine 0.6 MG tablet Take 0.6 mg by mouth 2 (two) times daily.  . minoxidil (LONITEN) 2.5 MG tablet Take 1 tablet (2.5 mg total) by mouth  2 (two) times daily.  . traMADol (ULTRAM) 50 MG tablet Take 1 tablet (50 mg total) by mouth every 8 (eight) hours as needed. (Patient taking differently: Take 50 mg by mouth every 8 (eight) hours as needed for moderate pain. )  . valsartan (DIOVAN) 320 MG tablet TAKE 1 TABLET BY MOUTH EVERY DAY (Patient taking differently: Take 320 mg by mouth daily. )  . [DISCONTINUED] allopurinol (ZYLOPRIM) 100 MG tablet Take 100 mg by mouth daily. (Patient not taking: Reported on 05/28/2020)  . [DISCONTINUED] hydrochlorothiazide (HYDRODIURIL) 25 MG tablet TAKE  1 TABLET BY MOUTH EVERY DAY (Patient not taking: Reported on 05/28/2020)   No facility-administered encounter medications on file as of 05/28/2020.  :  Review of Systems:  Out of a complete 14 point review of systems, all are reviewed and negative with the exception of these symptoms as listed below: Review of Systems  Neurological:       Here for sleep consult. No prior sleep study. Pt reports intermittent snoring.   Epworth Sleepiness Scale 0= would never doze 1= slight chance of dozing 2= moderate chance of dozing 3= high chance of dozing  Sitting and reading:0 Watching TV:1 Sitting inactive in a public place (ex. Theater or meeting):0 As a passenger in a car for an hour without a break:0 Lying down to rest in the afternoon:2 Sitting and talking to someone:0 Sitting quietly after lunch (no alcohol):0 In a car, while stopped in traffic:0 Total:3     Objective:  Neurological Exam  Physical Exam Physical Examination:   Vitals:   05/28/20 1517  BP: (!) 156/80  Pulse: 72    General Examination: The patient is a very pleasant 69 y.o. male in no acute distress. He appears well-developed and well-nourished and well groomed.   HEENT: Normocephalic, atraumatic, pupils are equal, round and reactive to light, extraocular tracking is good without limitation to gaze excursion or nystagmus noted. Hearing is grossly intact. Face is symmetric  with normal facial animation. Speech is clear with no dysarthria noted. There is no hypophonia. There is no lip, neck/head, jaw or voice tremor. Neck is supple with full range of passive and active motion. There are no carotid bruits on auscultation. Oropharynx exam reveals: mild mouth dryness, adequate dental hygiene and moderate airway crowding, due to larger uvula, redundant soft palate, tonsils are 1+.  Mallampati is class II.  Tongue protrudes centrally in palate elevates symmetrically.  Neck circumference of 16-1/4 inches.  He has a mild overbite.  He has crowns in the front.  Chest: Clear to auscultation without wheezing, rhonchi or crackles noted.  Heart: S1+S2+0, regular and normal without murmurs, rubs or gallops noted.   Abdomen: Soft, non-tender and non-distended with normal bowel sounds appreciated on auscultation.  Extremities: There is no pitting edema in the distal lower extremities bilaterally.   Skin: Warm and dry without trophic changes noted.   Musculoskeletal: exam reveals no obvious joint deformities, tenderness or joint swelling or erythema.   Neurologically:  Mental status: The patient is awake, alert and oriented in all 4 spheres. His immediate and remote memory, attention, language skills and fund of knowledge are appropriate. There is no evidence of aphasia, agnosia, apraxia or anomia. Speech is clear with normal prosody and enunciation. Thought process is linear. Mood is normal and affect is normal.  Cranial nerves II - XII are as described above under HEENT exam.  Motor exam: Normal bulk, strength and tone is noted.  Perhaps slight grip strength weakness in the right upper extremity and very slight weakness of her right hip flexor, there is no tremor. Fine motor skills and coordination: grossly intact.  Cerebellar testing: No dysmetria or intention tremor. There is no truncal or gait ataxia.  Sensory exam: intact to light touch in the upper and lower extremities.   Reports some discomfort in the right thigh area. Gait, station and balance: He stands easily. No veering to one side is noted. No leaning to one side is noted. Posture is age-appropriate and stance is narrow based. Gait shows normal stride length and normal  pace.  There is a slight limp on the right.                 Assessment and Plan:  In summary, SHAINE NEWMARK is a very pleasant 69 y.o.-year old male with an underlying medical history of hypertension, hyperlipidemia, left thalamic stroke, gout, kidney stones, squamous cell skin cancer, and borderline obesity, whose history and physical exam are concerning for obstructive sleep apnea (OSA). I had a long chat with the patient about my findings and the diagnosis of OSA, its prognosis and treatment options. We talked about medical treatments, surgical interventions and non-pharmacological approaches. I explained in particular the risks and ramifications of untreated moderate to severe OSA, especially with respect to developing cardiovascular disease down the Road, including congestive heart failure, difficult to treat hypertension, cardiac arrhythmias, or stroke. Even type 2 diabetes has, in part, been linked to untreated OSA. Symptoms of untreated OSA include daytime sleepiness, memory problems, mood irritability and mood disorder such as depression and anxiety, lack of energy, as well as recurrent headaches, especially morning headaches. We talked about trying to maintain a healthy lifestyle in general, as well as the importance of weight control. We also talked about the importance of good sleep hygiene. I recommended the following at this time: sleep study.  I explained the sleep test procedure to the patient and also outlined possible surgical and non-surgical treatment options of OSA, including the use of a custom-made dental device (which would require a referral to a specialist dentist or oral surgeon), upper airway surgical options, such as  traditional UPPP or a novel less invasive surgical option in the form of Inspire hypoglossal nerve stimulation (which would involve a referral to an ENT surgeon). I also explained the CPAP treatment option to the patient, who indicated that he would be willing to try CPAP if the need arises. I explained the importance of being compliant with PAP treatment, not only for insurance purposes but primarily to improve His symptoms, and for the patient's long term health benefit, including to reduce His cardiovascular risks. I answered all his questions today and the patient was in agreement. I plan to see him back after the sleep study is completed and encouraged him to call with any interim questions, concerns, problems or updates.   Thank you very much for allowing me to participate in the care of this nice patient. If I can be of any further assistance to you please do not hesitate to talk to me.   Sincerely,   Star Age, MD, PhD

## 2020-05-28 NOTE — Patient Instructions (Addendum)
Thank you for choosing [facility] for your sleep related care! It was nice to meet you today! I appreciate that you entrust me with your sleep related healthcare concerns. I hope, I was able to address at least some of your concerns today, and that I can help you feel reassured and also get better.    Here is what we discussed today and what we came up with as our plan for you:    Based on your symptoms and your exam I believe you are at risk for obstructive sleep apnea (aka OSA), and I think we should proceed with a sleep study to determine whether you do or do not have OSA and how severe it is. Even, if you have mild OSA, I may want you to consider treatment with CPAP, as treatment of even borderline or mild sleep apnea can result and improvement of symptoms such as sleep disruption, daytime sleepiness, nighttime bathroom breaks, restless leg symptoms, improvement of headache syndromes, even improved mood disorder.   Please remember, the long-term risks and ramifications of untreated moderate to severe obstructive sleep apnea are: increased Cardiovascular disease, including congestive heart failure, stroke, difficult to control hypertension, treatment resistant obesity, arrhythmias, especially irregular heartbeat commonly known as A. Fib. (atrial fibrillation); even type 2 diabetes has been linked to untreated OSA.   Sleep apnea can cause disruption of sleep and sleep deprivation in most cases, which, in turn, can cause recurrent headaches, problems with memory, mood, concentration, focus, and vigilance. Most people with untreated sleep apnea report excessive daytime sleepiness, which can affect their ability to drive. Please do not drive if you feel sleepy. Patients with sleep apnea developed difficulty initiating and maintaining sleep (aka insomnia).   Having sleep apnea may increase your risk for other sleep disorders, including involuntary behaviors sleep such as sleep terrors,  sleep talking, sleepwalking.    Having sleep apnea can also increase your risk for restless leg syndrome and leg movements at night.   Please note that untreated obstructive sleep apnea may carry additional perioperative morbidity. Patients with significant obstructive sleep apnea (typically, in the moderate to severe degree) should receive, if possible, perioperative PAP (positive airway pressure) therapy and the surgeons and particularly the anesthesiologists should be informed of the diagnosis and the severity of the sleep disordered breathing.   I will likely see you back after your sleep study to go over the test results and where to go from there. We will call you after your sleep study to advise about the results (most likely, you will hear from [facility], my nurse) and to set up an appointment at the time, as necessary.    Our sleep lab administrative assistant will call you to schedule your sleep study and give you further instructions, regarding the check in process for the sleep study, arrival time, what to bring, when you can expect to leave after the study, etc., and to answer any other logistical questions you may have. If you don't hear back from her by about 2 weeks from now, please feel free to call her direct line at [phone] or you can call our general clinic number, or email [place] through My Chart.

## 2020-05-29 ENCOUNTER — Ambulatory Visit: Payer: Medicare HMO | Attending: Adult Health

## 2020-05-29 DIAGNOSIS — I69351 Hemiplegia and hemiparesis following cerebral infarction affecting right dominant side: Secondary | ICD-10-CM | POA: Insufficient documentation

## 2020-05-29 DIAGNOSIS — R0781 Pleurodynia: Secondary | ICD-10-CM | POA: Diagnosis not present

## 2020-05-29 DIAGNOSIS — M25511 Pain in right shoulder: Secondary | ICD-10-CM | POA: Diagnosis not present

## 2020-05-29 DIAGNOSIS — R2681 Unsteadiness on feet: Secondary | ICD-10-CM | POA: Diagnosis not present

## 2020-05-29 DIAGNOSIS — M25552 Pain in left hip: Secondary | ICD-10-CM | POA: Diagnosis not present

## 2020-05-29 NOTE — Therapy (Signed)
[facility] [address] [place], [place], [address] Phone: [phone]   Fax:  [phone]  Physical Therapy Treatment  Patient Details  Name: Thomas [name] MRN: [id number] Date of Birth: [birth year] Referring Provider (PT): Frann Rider, FNP   Encounter Date: 05/29/2020   PT End of Session - 05/29/20 1654    Visit Number 5    Number of Visits 9    Date for PT Re-Evaluation 06/08/20    PT Start Time 6761    PT Stop Time 1615    PT Time Calculation (min) 45 min    Equipment Utilized During Treatment Gait belt    Activity Tolerance Patient tolerated treatment well    Behavior During Therapy [facility] for tasks assessed/performed           Past Medical History:  Diagnosis Date  . Avulsed toenail, initial encounter 05/04/2018  . Chronic gout    multiple sites--- followed by pcp--- (04-14-2018  per pt last flare-up , march 2019)  . Dyspnea on exertion 02/10/2020  . ED (erectile dysfunction)   . Epidermoid cyst of neck 10/16/2017  . Essential hypertension 07/21/2017  . Gout 01/19/2014  . Gout of multiple sites 07/21/2017  . History of kidney stones   . History of squamous cell carcinoma excision   . Hyperlipidemia   . Hypertension   . Hypertension not at goal 01/19/2014  . Left ureteral calculus   . Lower abdominal pain 08/27/2015  . Multiple joint pain 07/16/2018  . Pain in joint, shoulder region 12/11/2015  . Preventative health care 11/14/2016  . Urgency of urination   . URI (upper respiratory infection) 11/08/2014  . Wears glasses     Past Surgical History:  Procedure Laterality Date  . COLONOSCOPY WITH PROPOFOL  2017  . CYSTOSCOPY/RETROGRADE/URETEROSCOPY/STONE EXTRACTION WITH BASKET Left 04/16/2018   Procedure: CYSTOSCOPY/RETROGRADE/URETEROSCOPY/STONE EXTRACTION WITH BASKET/ HOLMIUM LASER LITHOTRIPSY/ STENT PLACEMENT;  Surgeon: Ardis Hughs, MD;  Location: [facility];  Service: Urology;   Laterality: Left;  . HOLMIUM LASER APPLICATION Left 9/50/9326   Procedure: HOLMIUM LASER APPLICATION;  Surgeon: Ardis Hughs, MD;  Location: [facility];  Service: Urology;  Laterality: Left;    There were no vitals filed for this visit.   Subjective Assessment - 05/29/20 1650    Subjective Pt reports he is able to walk up to 2 miles now. His upper back pain is feeling better but he still feels pain in mid to lower back that wraps around to R lateral ribs with reaching certain way.    Pertinent History past medical history of chronic arthritis, essential hypertension somewhat difficult to control on 4 medications at home, gout, hyperlipidemia,    How long can you sit comfortably? no issues    How long can you stand comfortably? 30 min    How long can you walk comfortably? 1 hour    Diagnostic tests MRI was performed that confirmed a left thalamic lacunar infarct.    Patient Stated Goals be able to walk 45 min a day, improve R shoulder and rib pain    Currently in Pain? Yes    Pain Score 3     Pain Location Back    Pain Orientation Right    Pain Descriptors / Indicators Aching;Tightness    Pain Type Chronic pain    Pain Onset More than a month ago    Pain Frequency Intermittent    Aggravating Factors  froward and L reaching with  R UE           Manual therapy: STM to R quadratus lumborum, multifidus, lumbar praspinalis, lats Myofascial release to clear R lower posterior rib cage border  TherEx: Below exercises reviewed and demonstrated with patient.   [facility] PT Assessment - 05/29/20 1657      Dynamic Gait Index   Level Surface Normal    Change in Gait Speed Normal    Gait with Horizontal Head Turns Normal    Gait with Vertical Head Turns Normal    Gait and Pivot Turn Normal    Step Over Obstacle Normal    Step Around Obstacles Normal    Steps Normal    Total Score 24             Access Code: [email or web address] URL:  [email or web address] Date: 05/29/2020 Prepared by: Markus Jarvis  Exercises Standing Quadratus Lumborum Stretch with Doorway - 1 x daily - 3 reps - 30 hold Seated Trunk Rotation Stretch - 1 x daily - 7 x weekly - 2 sets - 10 reps Side Stepping with Resistance at Ankles - 1 x daily - 7 x weekly - 2 sets - 10 reps Forward Monster Walks - 1 x daily - 7 x weekly - 2 sets - 10 reps Backward Monster Walks - 1 x daily - 7 x weekly - 2 sets - 10 reps                        PT Short Term Goals - 05/29/20 1652      PT SHORT TERM GOAL #1   Title Patient will score 24/24 on DGI to improve dynamic gait in 3 weeks    Baseline 22/24 (eval)    Time 2    Period Weeks    Status Achieved    Target Date 05/11/20      PT SHORT TERM GOAL #2   Title Patient will be able to stand on one leg for 10 sec to improve overall balance.    Baseline 6-8 sec bil (eval)    Time 2    Period Weeks    Status Achieved    Target Date 05/11/20      PT SHORT TERM GOAL #3   Title Pt will report <6/10 pain in R ribs with forward reaching with R arm to improve function at home and work    Baseline 8/10 (eval); 3/10 05/29/20    Time 2    Period Weeks    Status Achieved    Target Date 05/11/20             PT Long Term Goals - 05/29/20 1653      PT LONG TERM GOAL #1   Title Pt will report <2/10 pain intermittently, in R peiscapular region with daily functioning    Baseline 6/10 and constant (eval); no pain in periscapular region    Time 6    Period Weeks    Status Achieved      PT LONG TERM GOAL #2   Title Patient will report <0/10 pain in R ribs with functional reaching, pushing/pulling grocery cart to improve function    Baseline 8/10, 3/10 05/29/20    Time 6    Period Weeks    Status Revised      PT LONG TERM GOAL #3   Title Pt will report score of 80 or higher on FOTO to improve therapeutic outcomes.    Baseline 59  Time 6    Period Weeks    Status New                  Plan - 05/29/20 1654    Clinical Impression Statement Pt is no longer reporting pain in R periscapular region but is reporting pain R side of loewr ack and lateral ribs. Patient is reporting improving ability to walk for up to 2 miles now.    Personal Factors and Comorbidities Comorbidity 3+    Comorbidities past medical history of chronic arthritis, essential hypertension somewhat difficult to control on 4 medications at home, gout, hyperlipidemia,    Examination-Activity Limitations Carry;Lift;Reach Overhead;Sleep;Locomotion Level    Examination-Participation Restrictions Cleaning;Community Activity;Driving;Laundry;Yard Work    Stability/Clinical Decision Making Evolving/Moderate complexity    Rehab Potential Good    PT Frequency 2x / week   2x/week for first 2 weeks and then 1x/week thereafter.   PT Duration 6 weeks    PT Treatment/Interventions ADLs/Self Care Home Management;Moist Heat;Gait training;Stair training;Functional mobility training;Therapeutic activities;Therapeutic exercise;Balance training;Neuromuscular re-education;Patient/family education;Manual techniques    PT Next Visit Plan Manual therapy for R shoulder pain, R rib pain, issue HEP    PT Home Exercise Plan Work on walking program (pt educated to walk 5-10 min as long as walking doesn't irritate his gout) and then slowly build up his walking endurane, 1st rib self mobilization with scalene stretch           Patient will benefit from skilled therapeutic intervention in order to improve the following deficits and impairments:  Abnormal gait, Decreased activity tolerance, Decreased endurance, Difficulty walking, Pain  Visit Diagnosis: Unsteadiness on feet  Acute pain of right shoulder  Pain in left hip  Hemiplegia and hemiparesis following cerebral infarction affecting right dominant side (HCC)  Rib pain on right side     Problem List Patient Active Problem List   Diagnosis Date Noted  . Acute  ischemic stroke ([place]) 03/22/2020  . It band syndrome, left 03/19/2020  . Metatarsalgia of both feet 03/19/2020  . 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 Dyspnea on exertion 02/10/2020  . Multiple joint pain 07/16/2018  . Avulsed toenail, initial encounter 05/04/2018  . Epidermoid cyst of neck 10/16/2017  . Essential hypertension 07/21/2017  . Gout of multiple sites 07/21/2017  . Preventative health care 11/14/2016  . Pain in joint, shoulder region 12/11/2015  . Lower abdominal pain 08/27/2015  . URI (upper respiratory infection) 11/08/2014  . Gout 01/19/2014  . Hypertension not at goal 01/19/2014    Kerrie Pleasure, PT 05/29/2020, 4:57 PM  [facility] [address] [place], [place], [address] Phone: [phone]   Fax:  [phone]  Name: PRINCE COUEY MRN: [id number] Date of Birth: [birth year]

## 2020-05-30 MED ORDER — MINOXIDIL 2.5 MG PO TABS: 5.0000 mg | ORAL_TABLET | Freq: Two times a day (BID) | ORAL | 1 refills | Status: DC

## 2020-05-31 ENCOUNTER — Telehealth: Payer: Self-pay

## 2020-05-31 ENCOUNTER — Other Ambulatory Visit: Payer: Self-pay | Admitting: Medical

## 2020-05-31 NOTE — Telephone Encounter (Signed)
Unable to LVM for pt to schedule sleep study. Will try again later today

## 2020-06-03 ENCOUNTER — Other Ambulatory Visit: Payer: Self-pay | Admitting: Family Medicine

## 2020-06-04 ENCOUNTER — Other Ambulatory Visit: Payer: Self-pay

## 2020-06-04 ENCOUNTER — Telehealth: Payer: Self-pay

## 2020-06-04 ENCOUNTER — Ambulatory Visit: Payer: Medicare HMO

## 2020-06-04 DIAGNOSIS — R2681 Unsteadiness on feet: Secondary | ICD-10-CM

## 2020-06-04 DIAGNOSIS — I69351 Hemiplegia and hemiparesis following cerebral infarction affecting right dominant side: Secondary | ICD-10-CM | POA: Diagnosis not present

## 2020-06-04 DIAGNOSIS — R0781 Pleurodynia: Secondary | ICD-10-CM

## 2020-06-04 DIAGNOSIS — M25511 Pain in right shoulder: Secondary | ICD-10-CM

## 2020-06-04 DIAGNOSIS — M25552 Pain in left hip: Secondary | ICD-10-CM | POA: Diagnosis not present

## 2020-06-04 NOTE — Therapy (Signed)
[facility] [address] [place], [place], [address] Phone: [phone]   Fax:  [phone]  Physical Therapy Treatment  Patient Details  Name: Thomas [name] MRN: [id number] Date of Birth: February 17, 1951 Referring Provider (PT): Frann Rider, FNP   Encounter Date: 06/04/2020   PT End of Session - 06/04/20 1442    Visit Number 6    Number of Visits 9    Date for PT Re-Evaluation 06/08/20    PT Start Time 1400    PT Stop Time 1445    PT Time Calculation (min) 45 min    Equipment Utilized During Treatment Gait belt    Activity Tolerance Patient tolerated treatment well    Behavior During Therapy [facility] for tasks assessed/performed           Past Medical History:  Diagnosis Date  . Avulsed toenail, initial encounter 05/04/2018  . Chronic gout    multiple sites--- followed by pcp--- (04-14-2018  per pt last flare-up , march 2019)  . Dyspnea on exertion 02/10/2020  . ED (erectile dysfunction)   . Epidermoid cyst of neck 10/16/2017  . Essential hypertension 07/21/2017  . Gout 01/19/2014  . Gout of multiple sites 07/21/2017  . History of kidney stones   . History of squamous cell carcinoma excision   . Hyperlipidemia   . Hypertension   . Hypertension not at goal 01/19/2014  . Left ureteral calculus   . Lower abdominal pain 08/27/2015  . Multiple joint pain 07/16/2018  . Pain in joint, shoulder region 12/11/2015  . Preventative health care 11/14/2016  . Urgency of urination   . URI (upper respiratory infection) 11/08/2014  . Wears glasses     Past Surgical History:  Procedure Laterality Date  . COLONOSCOPY WITH PROPOFOL  2017  . CYSTOSCOPY/RETROGRADE/URETEROSCOPY/STONE EXTRACTION WITH BASKET Left 04/16/2018   Procedure: CYSTOSCOPY/RETROGRADE/URETEROSCOPY/STONE EXTRACTION WITH BASKET/ HOLMIUM LASER LITHOTRIPSY/ STENT PLACEMENT;  Surgeon: Ardis Hughs, MD;  Location: [facility];  Service: Urology;   Laterality: Left;  . HOLMIUM LASER APPLICATION Left 9/98/3382   Procedure: HOLMIUM LASER APPLICATION;  Surgeon: Ardis Hughs, MD;  Location: [facility];  Service: Urology;  Laterality: Left;    There were no vitals filed for this visit.   Subjective Assessment - 06/04/20 1439    Subjective Pt reports symptoms are improving. Exercises are helping. He felt pain on Saturday. Yesterday and today, it is okay.    Pertinent History past medical history of chronic arthritis, essential hypertension somewhat difficult to control on 4 medications at home, gout, hyperlipidemia,    How long can you sit comfortably? no issues    How long can you stand comfortably? 30 min    How long can you walk comfortably? 1 hour    Diagnostic tests MRI was performed that confirmed a left thalamic lacunar infarct.    Patient Stated Goals be able to walk 45 min a day, improve R shoulder and rib pain    Pain Onset More than a month ago                  Manual therapy Myofascial release to anterior, lateral and posterior R lower rib cage borders STM to R latissmus dorsi, quadratus lumborum  TherEx: L sidelying, R pelvis elevation and depression: AA: 20x Lat pull downs: 50lbs 2 x 10 Unilateral row: 25lbs 2 x 10 R and L Shoulder extension: unilateral: 15lbs 2 x 10 R and L Doorway lat stretch: 2  x 30" R and L                     PT Short Term Goals - 05/29/20 1652      PT SHORT TERM GOAL #1   Title Patient will score 24/24 on DGI to improve dynamic gait in 3 weeks    Baseline 22/24 (eval)    Time 2    Period Weeks    Status Achieved    Target Date 05/11/20      PT SHORT TERM GOAL #2   Title Patient will be able to stand on one leg for 10 sec to improve overall balance.    Baseline 6-8 sec bil (eval)    Time 2    Period Weeks    Status Achieved    Target Date 05/11/20      PT SHORT TERM GOAL #3   Title Pt will report <6/10 pain in R ribs with forward  reaching with R arm to improve function at home and work    Baseline 8/10 (eval); 3/10 05/29/20    Time 2    Period Weeks    Status Achieved    Target Date 05/11/20             PT Long Term Goals - 05/29/20 1653      PT LONG TERM GOAL #1   Title Pt will report <2/10 pain intermittently, in R peiscapular region with daily functioning    Baseline 6/10 and constant (eval); no pain in periscapular region    Time 6    Period Weeks    Status Achieved      PT LONG TERM GOAL #2   Title Patient will report <0/10 pain in R ribs with functional reaching, pushing/pulling grocery cart to improve function    Baseline 8/10, 3/10 05/29/20    Time 6    Period Weeks    Status Revised      PT LONG TERM GOAL #3   Title Pt will report score of 80 or higher on FOTO to improve therapeutic outcomes.    Baseline 59    Time 6    Period Weeks    Status New                 Plan - 06/04/20 1440    Clinical Impression Statement Today's session was focused on manual therapy to improve soft tissue restrictions on R rib cage laterally and R lower back. Patient was progressed with exercises and tolerated them without any pain or discomfort.    Personal Factors and Comorbidities Comorbidity 3+    Comorbidities past medical history of chronic arthritis, essential hypertension somewhat difficult to control on 4 medications at home, gout, hyperlipidemia,    Examination-Activity Limitations Carry;Lift;Reach Overhead;Sleep;Locomotion Level    Examination-Participation Restrictions Cleaning;Community Activity;Driving;Laundry;Yard Work    Stability/Clinical Decision Making Evolving/Moderate complexity    Rehab Potential Good    PT Frequency 2x / week   2x/week for first 2 weeks and then 1x/week thereafter.   PT Duration 6 weeks    PT Treatment/Interventions ADLs/Self Care Home Management;Moist Heat;Gait training;Stair training;Functional mobility training;Therapeutic activities;Therapeutic exercise;Balance  training;Neuromuscular re-education;Patient/family education;Manual techniques    PT Next Visit Plan Manual therapy for R shoulder pain, R rib pain, issue HEP    PT Home Exercise Plan Work on walking program (pt educated to walk 5-10 min as long as walking doesn't irritate his gout) and then slowly build up his walking endurane, 1st rib  self mobilization with scalene stretch           Patient will benefit from skilled therapeutic intervention in order to improve the following deficits and impairments:  Abnormal gait, Decreased activity tolerance, Decreased endurance, Difficulty walking, Pain  Visit Diagnosis: Unsteadiness on feet  Acute pain of right shoulder  Pain in left hip  Hemiplegia and hemiparesis following cerebral infarction affecting right dominant side (HCC)  Rib pain on right side     Problem List Patient Active Problem List   Diagnosis Date Noted  . Acute ischemic stroke ([place]) 03/22/2020  . It band syndrome, left 03/19/2020  . Metatarsalgia of both feet 03/19/2020  . 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 Dyspnea on exertion 02/10/2020  . Multiple joint pain 07/16/2018  . Avulsed toenail, initial encounter 05/04/2018  . Epidermoid cyst of neck 10/16/2017  . Essential hypertension 07/21/2017  . Gout of multiple sites 07/21/2017  . Preventative health care 11/14/2016  . Pain in joint, shoulder region 12/11/2015  . Lower abdominal pain 08/27/2015  . URI (upper respiratory infection) 11/08/2014  . Gout 01/19/2014  . Hypertension not at goal 01/19/2014    Kerrie Pleasure 06/04/2020, 2:43 PM  [facility] [address] [place], [place], [address] Phone: [phone]   Fax:  [phone]  Name: Thomas [name] MRN: [id number] Date of Birth: 1950-12-30

## 2020-06-04 NOTE — Telephone Encounter (Signed)
Unable LVM for pt to call me back to schedule sleep study   We have attempted to call the patient two times to schedule sleep study.  Patient has been unavailable at the phone numbers we have on file and has not returned our calls.  If patient calls back we will schedule them for their sleep study.

## 2020-06-11 ENCOUNTER — Other Ambulatory Visit: Payer: Self-pay

## 2020-06-11 ENCOUNTER — Ambulatory Visit: Payer: Medicare HMO

## 2020-06-11 DIAGNOSIS — I69351 Hemiplegia and hemiparesis following cerebral infarction affecting right dominant side: Secondary | ICD-10-CM

## 2020-06-11 DIAGNOSIS — R0781 Pleurodynia: Secondary | ICD-10-CM

## 2020-06-11 DIAGNOSIS — R2681 Unsteadiness on feet: Secondary | ICD-10-CM | POA: Diagnosis not present

## 2020-06-11 DIAGNOSIS — M25552 Pain in left hip: Secondary | ICD-10-CM

## 2020-06-11 DIAGNOSIS — M25511 Pain in right shoulder: Secondary | ICD-10-CM | POA: Diagnosis not present

## 2020-06-11 NOTE — Addendum Note (Signed)
Addended by: Kerrie Pleasure on: 06/11/2020 09:09 PM   Modules accepted: Orders

## 2020-06-11 NOTE — Therapy (Signed)
[facility] [address] [place], [place], [address] Phone: [phone]   Fax:  [phone]  Physical Therapy Therapy Re-certification  Patient Details  Name: Thomas [name] MRN: [id number] Date of Birth: Feb 20, 1951 Referring Provider (PT): Frann Rider, FNP   Encounter Date: 06/11/2020   PT End of Session - 06/11/20 2102    Visit Number 7    Number of Visits 13    Date for PT Re-Evaluation 07/23/20    Authorization Type 04/27/20 Eval; 4/85/46 Re-certification (1x/week for 6 wks)    PT Start Time 1400    PT Stop Time 1445    PT Time Calculation (min) 45 min    Equipment Utilized During Treatment Gait belt    Activity Tolerance Patient tolerated treatment well    Behavior During Therapy [facility] for tasks assessed/performed           Past Medical History:  Diagnosis Date  . Avulsed toenail, initial encounter 05/04/2018  . Chronic gout    multiple sites--- followed by pcp--- (04-14-2018  per pt last flare-up , march 2019)  . Dyspnea on exertion 02/10/2020  . ED (erectile dysfunction)   . Epidermoid cyst of neck 10/16/2017  . Essential hypertension 07/21/2017  . Gout 01/19/2014  . Gout of multiple sites 07/21/2017  . History of kidney stones   . History of squamous cell carcinoma excision   . Hyperlipidemia   . Hypertension   . Hypertension not at goal 01/19/2014  . Left ureteral calculus   . Lower abdominal pain 08/27/2015  . Multiple joint pain 07/16/2018  . Pain in joint, shoulder region 12/11/2015  . Preventative health care 11/14/2016  . Urgency of urination   . URI (upper respiratory infection) 11/08/2014  . Wears glasses     Past Surgical History:  Procedure Laterality Date  . COLONOSCOPY WITH PROPOFOL  2017  . CYSTOSCOPY/RETROGRADE/URETEROSCOPY/STONE EXTRACTION WITH BASKET Left 04/16/2018   Procedure: CYSTOSCOPY/RETROGRADE/URETEROSCOPY/STONE EXTRACTION WITH BASKET/ HOLMIUM LASER LITHOTRIPSY/ STENT PLACEMENT;   Surgeon: Ardis Hughs, MD;  Location: [facility];  Service: Urology;  Laterality: Left;  . HOLMIUM LASER APPLICATION Left 2/70/3500   Procedure: HOLMIUM LASER APPLICATION;  Surgeon: Ardis Hughs, MD;  Location: [facility];  Service: Urology;  Laterality: Left;    There were no vitals filed for this visit.   Subjective Assessment - 06/11/20 1528    Subjective Doing well. pain is slowly getting better.    Pertinent History past medical history of chronic arthritis, essential hypertension somewhat difficult to control on 4 medications at home, gout, hyperlipidemia,    How long can you sit comfortably? no issues    How long can you stand comfortably? 30 min    How long can you walk comfortably? 1 hour    Diagnostic tests MRI was performed that confirmed a left thalamic lacunar infarct.    Patient Stated Goals be able to walk 45 min a day, improve R shoulder and rib pain    Currently in Pain? Yes    Pain Score 4     Pain Location Rib cage    Pain Orientation Right    Pain Descriptors / Indicators Aching    Pain Onset More than a month ago               Manual therapy Myofascial release to clear anterior/inferior rib cage border, lateral/inferior rib cage border, posterior/inferior rib cage border, lower thoracic paraspinalis, R anterior iliac crest border  STM  to R anterior hip flexors  Off edge of bed hip flexor stretch: 3 x 1'  Next session: modified side plank                        PT Short Term Goals - 05/29/20 1652      PT SHORT TERM GOAL #1   Title Patient will score 24/24 on DGI to improve dynamic gait in 3 weeks    Baseline 22/24 (eval)    Time 2    Period Weeks    Status Achieved    Target Date 05/11/20      PT SHORT TERM GOAL #2   Title Patient will be able to stand on one leg for 10 sec to improve overall balance.    Baseline 6-8 sec bil (eval)    Time 2    Period Weeks    Status Achieved      Target Date 05/11/20      PT SHORT TERM GOAL #3   Title Pt will report <6/10 pain in R ribs with forward reaching with R arm to improve function at home and work    Baseline 8/10 (eval); 3/10 05/29/20    Time 2    Period Weeks    Status Achieved    Target Date 05/11/20             PT Long Term Goals - 06/11/20 2105      PT LONG TERM GOAL #1   Title Pt will report <2/10 pain intermittently, in R peiscapular region with daily functioning    Baseline 6/10 and constant (eval); no pain in periscapular region    Time 6    Period Weeks    Status Achieved      PT LONG TERM GOAL #2   Title Patient will report <0/10 pain in R ribs with functional reaching, pushing/pulling grocery cart to improve function    Baseline 8/10, 3/10 05/29/20    Time 6    Period Weeks    Status Revised      PT LONG TERM GOAL #3   Title Pt will report score of 80 or higher on FOTO to improve therapeutic outcomes.    Baseline 59    Time 6    Period Weeks    Status New      PT LONG TERM GOAL #4   Title Patient will report undistrubed sleep due to pain to improve sleeping    Baseline Wakes patient up 2-3x//night (06/11/20)    Time 6    Period Weeks                 Plan - 06/11/20 1529    Clinical Impression Statement Patient has been seen for total of 7 sessions from 04/27/20 to 06/11/20. Patient has made a good progress towards his short term and long term functional goals. Patient has met all of his short term and most of his long term goals. Patient continues to have pain in lower right rib cage that radiates towards his R side of back. patient is no longer reporting pain in R periscapular region. Current pain is limiting patient from perfoming reaching in certain directions and still waking him up at night. Patient will benefit from conintued skilled PT to improve soft tissue restrictions and reduce pain with functional activities and sleeping.    Personal Factors and Comorbidities Comorbidity 3+     Comorbidities past medical history of chronic arthritis, essential hypertension  somewhat difficult to control on 4 medications at home, gout, hyperlipidemia,    Examination-Activity Limitations Carry;Lift;Reach Overhead;Sleep;Locomotion Level    Examination-Participation Restrictions Cleaning;Community Activity;Driving;Laundry;Yard Work    Stability/Clinical Decision Making Evolving/Moderate complexity    Rehab Potential Good    PT Frequency 1x / week   2x/week for first 2 weeks and then 1x/week thereafter.   PT Duration 6 weeks    PT Treatment/Interventions ADLs/Self Care Home Management;Moist Heat;Gait training;Stair training;Functional mobility training;Therapeutic activities;Therapeutic exercise;Balance training;Neuromuscular re-education;Patient/family education;Manual techniques    PT Next Visit Plan Manual therapy for R shoulder pain, R rib pain, issue HEP    PT Home Exercise Plan Work on walking program (pt educated to walk 5-10 min as long as walking doesn't irritate his gout) and then slowly build up his walking endurane, 1st rib self mobilization with scalene stretchAccess Code: [email or web address]: [email or web address]: 07/26/2021Prepared by: Gwenyth Bouillon PatelExercisesHip Flexor Stretch at [facility] of Bed - 1 x daily - 7 x weekly - 1 sets - 3 reps - 60 hold           Patient will benefit from skilled therapeutic intervention in order to improve the following deficits and impairments:  Abnormal gait, Decreased activity tolerance, Decreased endurance, Difficulty walking, Pain  Visit Diagnosis: Unsteadiness on feet  Acute pain of right shoulder  Pain in left hip  Hemiplegia and hemiparesis following cerebral infarction affecting right dominant side (HCC)  Rib pain on right side     Problem List Patient Active Problem List   Diagnosis Date Noted  . Acute ischemic stroke ([place]) 03/22/2020  . It band syndrome, left 03/19/2020  . Metatarsalgia of both feet 03/19/2020  .  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 Dyspnea on exertion 02/10/2020  . Multiple joint pain 07/16/2018  . Avulsed toenail, initial encounter 05/04/2018  . Epidermoid cyst of neck 10/16/2017  . Essential hypertension 07/21/2017  . Gout of multiple sites 07/21/2017  . Preventative health care 11/14/2016  . Pain in joint, shoulder region 12/11/2015  . Lower abdominal pain 08/27/2015  . URI (upper respiratory infection) 11/08/2014  . Gout 01/19/2014  . Hypertension not at goal 01/19/2014    Kerrie Pleasure , PT 06/11/2020, 9:08 PM  [facility] [address] [place], [place], [address] Phone: [phone]   Fax:  [phone]  Name: Thomas [name] MRN: [id number] Date of Birth: Feb 18, 1951

## 2020-06-17 ENCOUNTER — Other Ambulatory Visit: Payer: Self-pay | Admitting: Cardiology

## 2020-06-18 ENCOUNTER — Ambulatory Visit: Payer: Medicare HMO | Attending: Adult Health

## 2020-06-18 ENCOUNTER — Other Ambulatory Visit: Payer: Self-pay

## 2020-06-18 DIAGNOSIS — I69351 Hemiplegia and hemiparesis following cerebral infarction affecting right dominant side: Secondary | ICD-10-CM | POA: Insufficient documentation

## 2020-06-18 DIAGNOSIS — M25552 Pain in left hip: Secondary | ICD-10-CM | POA: Diagnosis not present

## 2020-06-18 DIAGNOSIS — R0781 Pleurodynia: Secondary | ICD-10-CM | POA: Diagnosis not present

## 2020-06-18 DIAGNOSIS — R2681 Unsteadiness on feet: Secondary | ICD-10-CM | POA: Insufficient documentation

## 2020-06-18 DIAGNOSIS — M25511 Pain in right shoulder: Secondary | ICD-10-CM

## 2020-06-18 NOTE — Telephone Encounter (Signed)
Pt is requesting a refill on Minoxidil but it looks like he hasn't had labs done since this was increased. Would you like for me to refill this?

## 2020-06-18 NOTE — Therapy (Signed)
[facility] [address] [place], [place], [address] Phone: [phone]   Fax:  [phone]  Physical Therapy Treatment  Patient Details  Name: Thomas [name] MRN: [id number] Date of Birth: 23-Jun-1951 Referring Provider (PT): Frann Rider, FNP   Encounter Date: 06/18/2020   PT End of Session - 06/18/20 1424    Visit Number 8    Number of Visits 13    Date for PT Re-Evaluation 07/23/20    Authorization Type 04/27/20 Eval; 6/96/78 Re-certification (1x/week for 6 wks)    PT Start Time 1405    PT Stop Time 1445    PT Time Calculation (min) 40 min    Equipment Utilized During Treatment Gait belt    Activity Tolerance Patient tolerated treatment well    Behavior During Therapy [facility] for tasks assessed/performed           Past Medical History:  Diagnosis Date  . Avulsed toenail, initial encounter 05/04/2018  . Chronic gout    multiple sites--- followed by pcp--- (04-14-2018  per pt last flare-up , march 2019)  . Dyspnea on exertion 02/10/2020  . ED (erectile dysfunction)   . Epidermoid cyst of neck 10/16/2017  . Essential hypertension 07/21/2017  . Gout 01/19/2014  . Gout of multiple sites 07/21/2017  . History of kidney stones   . History of squamous cell carcinoma excision   . Hyperlipidemia   . Hypertension   . Hypertension not at goal 01/19/2014  . Left ureteral calculus   . Lower abdominal pain 08/27/2015  . Multiple joint pain 07/16/2018  . Pain in joint, shoulder region 12/11/2015  . Preventative health care 11/14/2016  . Urgency of urination   . URI (upper respiratory infection) 11/08/2014  . Wears glasses     Past Surgical History:  Procedure Laterality Date  . COLONOSCOPY WITH PROPOFOL  2017  . CYSTOSCOPY/RETROGRADE/URETEROSCOPY/STONE EXTRACTION WITH BASKET Left 04/16/2018   Procedure: CYSTOSCOPY/RETROGRADE/URETEROSCOPY/STONE EXTRACTION WITH BASKET/ HOLMIUM LASER LITHOTRIPSY/ STENT PLACEMENT;  Surgeon:  Ardis Hughs, MD;  Location: [facility];  Service: Urology;  Laterality: Left;  . HOLMIUM LASER APPLICATION Left 9/38/1017   Procedure: HOLMIUM LASER APPLICATION;  Surgeon: Ardis Hughs, MD;  Location: [facility];  Service: Urology;  Laterality: Left;    There were no vitals filed for this visit.   Subjective Assessment - 06/18/20 1406    Subjective Doing well. pain is slowly getting better.    Pertinent History past medical history of chronic arthritis, essential hypertension somewhat difficult to control on 4 medications at home, gout, hyperlipidemia,    How long can you sit comfortably? no issues    How long can you stand comfortably? 30 min    How long can you walk comfortably? 1 hour    Diagnostic tests MRI was performed that confirmed a left thalamic lacunar infarct.    Patient Stated Goals be able to walk 45 min a day, improve R shoulder and rib pain    Currently in Pain? Yes    Pain Score 2     Pain Location Rib cage    Pain Onset More than a month ago               IT band roll to bil IT bands Myofascial reelase to R anterior hip  TherEx: Piriformis stretch: 3 x 30" R and L Supine IT band stretch: 5 x 15" R and L, pt report increased soreness in L hip SL hip abduction:  2reps, pt had severe cramps both times, even with cueing to isolate glut meds Bridge: 2 x 10, pt reported cramps in L hamstring afterwards Seated hamstring stretch: 3 x 30" L only, helped with hamstring cramps          Access Code: [email or web address] URL: [email or web address] Date: 06/18/2020 Prepared by: Markus Jarvis  Exercises Supine Piriformis Stretch with Foot on Ground - 1 x daily - 7 x weekly - 3 reps - 30 hold Supine Bridge - 1 x daily - 7 x weekly - 2 sets - 10 reps                 PT Short Term Goals - 05/29/20 1652      PT SHORT TERM GOAL #1   Title Patient will score 24/24 on DGI to improve dynamic gait in 3  weeks    Baseline 22/24 (eval)    Time 2    Period Weeks    Status Achieved    Target Date 05/11/20      PT SHORT TERM GOAL #2   Title Patient will be able to stand on one leg for 10 sec to improve overall balance.    Baseline 6-8 sec bil (eval)    Time 2    Period Weeks    Status Achieved    Target Date 05/11/20      PT SHORT TERM GOAL #3   Title Pt will report <6/10 pain in R ribs with forward reaching with R arm to improve function at home and work    Baseline 8/10 (eval); 3/10 05/29/20    Time 2    Period Weeks    Status Achieved    Target Date 05/11/20             PT Long Term Goals - 06/11/20 2105      PT LONG TERM GOAL #1   Title Pt will report <2/10 pain intermittently, in R peiscapular region with daily functioning    Baseline 6/10 and constant (eval); no pain in periscapular region    Time 6    Period Weeks    Status Achieved      PT LONG TERM GOAL #2   Title Patient will report <0/10 pain in R ribs with functional reaching, pushing/pulling grocery cart to improve function    Baseline 8/10, 3/10 05/29/20    Time 6    Period Weeks    Status Revised      PT LONG TERM GOAL #3   Title Pt will report score of 80 or higher on FOTO to improve therapeutic outcomes.    Baseline 59    Time 6    Period Weeks    Status New      PT LONG TERM GOAL #4   Title Patient will report undistrubed sleep due to pain to improve sleeping    Baseline Wakes patient up 2-3x//night (06/11/20)    Time 6    Period Weeks                 Plan - 06/18/20 1423    Clinical Impression Statement Patient is gradually reporting improving pain in his R lower ribs and R lower back. Patient is reporting that he is able to move better with less pain. 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 Patient's R ITband were very sore with soft tissue mobilization. Pt reported no pain in L or R hip with walking and pivoting and when going  up and down the stairs. Pt was given  updated HEP to stretch hips out to improve hip pain. Pt is not able to isolate glue med with exercises and compensates with other hip muscles which resulted in cramps during the exercises.    Personal Factors and Comorbidities Comorbidity 3+    Comorbidities past medical history of chronic arthritis, essential hypertension somewhat difficult to control on 4 medications at home, gout, hyperlipidemia,    Examination-Activity Limitations Carry;Lift;Reach Overhead;Sleep;Locomotion Level    Examination-Participation Restrictions Cleaning;Community Activity;Driving;Laundry;Yard Work    Stability/Clinical Decision Making Evolving/Moderate complexity    Rehab Potential Good    PT Frequency 1x / week   2x/week for first 2 weeks and then 1x/week thereafter.   PT Duration 6 weeks    PT Treatment/Interventions ADLs/Self Care Home Management;Moist Heat;Gait training;Stair training;Functional mobility training;Therapeutic activities;Therapeutic exercise;Balance training;Neuromuscular re-education;Patient/family education;Manual techniques    PT Next Visit Plan Manual therapy for R shoulder pain, R rib pain, issue HEP    PT Home Exercise Plan Work on walking program (pt educated to walk 5-10 min as long as walking doesn't irritate his gout) and then slowly build up his walking endurane, 1st rib self mobilization with scalene stretchAccess Code: [email or web address]: [email or web address]: 07/26/2021Prepared by: Gwenyth Bouillon PatelExercisesHip Flexor Stretch at [facility] of Bed - 1 x daily - 7 x weekly - 1 sets - 3 reps - 60 hold           Patient will benefit from skilled therapeutic intervention in order to improve the following deficits and impairments:  Abnormal gait, Decreased activity tolerance, Decreased endurance, Difficulty walking, Pain  Visit Diagnosis: Unsteadiness on feet  Acute pain of right shoulder  Pain in left hip  Hemiplegia and hemiparesis following cerebral infarction affecting right  dominant side (HCC)  Rib pain on right side     Problem List Patient Active Problem List   Diagnosis Date Noted  . Acute ischemic stroke ([place]) 03/22/2020  . It band syndrome, left 03/19/2020  . Metatarsalgia of both feet 03/19/2020  . 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 Dyspnea on exertion 02/10/2020  . Multiple joint pain 07/16/2018  . Avulsed toenail, initial encounter 05/04/2018  . Epidermoid cyst of neck 10/16/2017  . Essential hypertension 07/21/2017  . Gout of multiple sites 07/21/2017  . Preventative health care 11/14/2016  . Pain in joint, shoulder region 12/11/2015  . Lower abdominal pain 08/27/2015  . URI (upper respiratory infection) 11/08/2014  . Gout 01/19/2014  . Hypertension not at goal 01/19/2014    Kerrie Pleasure 06/18/2020, 2:49 PM  [facility] [address] [place], [place], [address] Phone: [phone]   Fax:  [phone]  Name: Thomas [name] MRN: [id number] Date of Birth: 05-03-1951

## 2020-06-19 ENCOUNTER — Ambulatory Visit: Payer: Medicare HMO | Admitting: Cardiology

## 2020-06-22 ENCOUNTER — Encounter: Payer: Self-pay | Admitting: Cardiology

## 2020-06-22 ENCOUNTER — Ambulatory Visit: Payer: Medicare HMO | Admitting: Cardiology

## 2020-06-22 ENCOUNTER — Other Ambulatory Visit: Payer: Self-pay

## 2020-06-22 VITALS — BP 170/90 | HR 77 | Ht 70.0 in | Wt 214.0 lb

## 2020-06-22 DIAGNOSIS — M109 Gout, unspecified: Secondary | ICD-10-CM

## 2020-06-22 DIAGNOSIS — R0609 Other forms of dyspnea: Secondary | ICD-10-CM

## 2020-06-22 DIAGNOSIS — Z79899 Other long term (current) drug therapy: Secondary | ICD-10-CM | POA: Diagnosis not present

## 2020-06-22 DIAGNOSIS — R0600 Dyspnea, unspecified: Secondary | ICD-10-CM

## 2020-06-22 DIAGNOSIS — I10 Essential (primary) hypertension: Secondary | ICD-10-CM | POA: Diagnosis not present

## 2020-06-22 MED ORDER — AMLODIPINE BESYLATE 5 MG PO TABS
5.0000 mg | ORAL_TABLET | Freq: Every day | ORAL | 3 refills | Status: DC
Start: 2020-06-22 — End: 2021-01-25

## 2020-06-22 NOTE — Progress Notes (Signed)
Cardiology Office Note:    Date:  06/22/2020   ID:  [name], DOB 03/06/1951, MRN [id number]  PCP:  Michael Boston, MD  Cardiologist:  Jenne Campus, MD    Referring MD: Michael Boston, MD   No chief complaint on file. Had problem with gout  History of Present Illness:    Thomas [name] is a 69 y.o. male with past medical history significant for essential hypertension, difficult to control, gout, dyslipidemia.  Comes today 2 months of follow-up.  Recently we had to discontinue his diuretic because of probably gout is still taking minoxidil however his blood pressure is not well controlled now.  Denies have any symptoms from that point review.  No chest pain tightness squeezing pressure burning chest no palpitations.  Past Medical History:  Diagnosis Date  . Avulsed toenail, initial encounter 05/04/2018  . Chronic gout    multiple sites--- followed by pcp--- (04-14-2018  per pt last flare-up , march 2019)  . Dyspnea on exertion 02/10/2020  . ED (erectile dysfunction)   . Epidermoid cyst of neck 10/16/2017  . Essential hypertension 07/21/2017  . Gout 01/19/2014  . Gout of multiple sites 07/21/2017  . History of kidney stones   . History of squamous cell carcinoma excision   . Hyperlipidemia   . Hypertension   . Hypertension not at goal 01/19/2014  . Left ureteral calculus   . Lower abdominal pain 08/27/2015  . Multiple joint pain 07/16/2018  . Pain in joint, shoulder region 12/11/2015  . Preventative health care 11/14/2016  . Urgency of urination   . URI (upper respiratory infection) 11/08/2014  . Wears glasses     Past Surgical History:  Procedure Laterality Date  . COLONOSCOPY WITH PROPOFOL  2017  . CYSTOSCOPY/RETROGRADE/URETEROSCOPY/STONE EXTRACTION WITH BASKET Left 04/16/2018   Procedure: CYSTOSCOPY/RETROGRADE/URETEROSCOPY/STONE EXTRACTION WITH BASKET/ HOLMIUM LASER LITHOTRIPSY/ STENT PLACEMENT;  Surgeon: Ardis Hughs, MD;  Location[facility];   Service: Urology;  Laterality: Left;  . HOLMIUM LASER APPLICATION Left 2/94/7654   Procedure: HOLMIUM LASER APPLICATION;  Surgeon: Ardis Hughs, MD;  Location[facility];  Service: Urology;  Laterality: Left;    Current Medications: Current Meds  Medication Sig  . allopurinol (ZYLOPRIM) 300 MG tablet Take 300 mg by mouth daily.  Marland Kitchen b complex vitamins tablet Take 1 tablet by mouth daily.  . Calcium-Magnesium-Vitamin D (CALCIUM MAGNESIUM PO) Take 1 tablet by mouth daily.  . cloNIDine (CATAPRES) 0.1 MG tablet TAKE 1 TABLET BY MOUTH TWICE A DAY (Patient taking differently: Take 0.1 mg by mouth daily. )  . minoxidil (LONITEN) 2.5 MG tablet Take 2 tablets (5 mg total) by mouth 2 (two) times daily.  . traMADol (ULTRAM) 50 MG tablet Take 1 tablet (50 mg total) by mouth every 8 (eight) hours as needed. (Patient taking differently: Take 50 mg by mouth every 8 (eight) hours as needed for moderate pain. )  . valsartan (DIOVAN) 320 MG tablet TAKE 1 TABLET BY MOUTH EVERY DAY     Allergies:   Patient has no known allergies.   Social History   Socioeconomic History  . Marital status: Married    Spouse name: Not on file  . Number of children: Not on file  . Years of education: Not on file  . Highest education level: Not on file  Occupational History  . Occupation: Pharmacist, hospital  Tobacco Use  . Smoking status: Former Smoker    Packs/day: 0.30    Years: 20.00  Pack years: 6.00    Types: Cigarettes    Quit date: 10/17/1994    Years since quitting: 25.6  . Smokeless tobacco: Never Used  Vaping Use  . Vaping Use: Never assessed  Substance and Sexual Activity  . Alcohol use: Yes    Alcohol/week: 2.0 standard drinks    Types: 2 Glasses of wine per week    Comment: rare  . Drug use: No  . Sexual activity: Yes  Other Topics Concern  . Not on file  Social History Narrative   Exercise 1 hour a day ---3-4 days a week   Social Determinants of Health   Financial Resource  Strain:   . Difficulty of Paying Living Expenses:   Food Insecurity:   . Worried About Charity fundraiser in the Last Year:   . Arboriculturist in the Last Year:   Transportation Needs:   . Film/video editor (Medical):   Marland Kitchen Lack of Transportation (Non-Medical):   Physical Activity:   . Days of Exercise per Week:   . Minutes of Exercise per Session:   Stress:   . Feeling of Stress :   Social Connections:   . Frequency of Communication with Friends and Family:   . Frequency of Social Gatherings with Friends and Family:   . Attends Religious Services:   . Active Member of Clubs or Organizations:   . Attends Archivist Meetings:   Marland Kitchen Marital Status:      Family History: The patient's family history includes Alcohol abuse in his mother; Cancer in his sister; Depression in his mother; Heart disease (age of onset: 65) in his mother; Hypertension in his father. There is no history of Colon cancer, Pancreatic cancer, Rectal cancer, or Stomach cancer. ROS:   Please see the history of present illness.    All 14 point review of systems negative except as described per history of present illness  EKGs/Labs/Other Studies Reviewed:      Recent Labs: 03/22/2020: ALT 66; BUN 28; Creatinine, Ser 1.10; Hemoglobin 16.3; Platelets 225; Potassium 4.2; Sodium 140  Recent Lipid Panel    Component Value Date/Time   CHOL 214 (H) 03/23/2020 0429   TRIG 273 (H) 03/23/2020 0429   HDL 33 (L) 03/23/2020 0429   CHOLHDL 6.5 03/23/2020 0429   VLDL 55 (H) 03/23/2020 0429   LDLCALC 126 (H) 03/23/2020 0429    Physical Exam:    VS:  BP (!) 170/90 (BP Location: Right Arm, Patient Position: Sitting, Cuff Size: Normal)   Pulse 77   Ht 5\' 10"  (1.778 m)   Wt 214 lb (97.1 kg)   SpO2 98%   BMI 30.71 kg/m     Wt Readings from Last 3 Encounters:  06/22/20 214 lb (97.1 kg)  05/28/20 215 lb 5 oz (97.7 kg)  04/24/20 213 lb (96.6 kg)     GEN:  Well nourished, well developed in no acute  distress HEENT: Normal NECK: No JVD; No carotid bruits LYMPHATICS: No lymphadenopathy CARDIAC: RRR, no murmurs, no rubs, no gallops RESPIRATORY:  Clear to auscultation without rales, wheezing or rhonchi  ABDOMEN: Soft, non-tender, non-distended MUSCULOSKELETAL:  No edema; No deformity  SKIN: Warm and dry LOWER EXTREMITIES: no swelling NEUROLOGIC:  Alert and oriented x 3 PSYCHIATRIC:  Normal affect   ASSESSMENT:    1. Medication management   2. Essential hypertension   3. Dyspnea on exertion   4. Gout, unspecified cause, unspecified chronicity, unspecified site    PLAN:  In order of problems listed above:  1. Essential hypertension still not controlled.  I will add amlodipine 5 mg to his medical regimen, Chem-7 will be checked today.  He is off diuretic.  We did talk about other things that can be done to help with the blood pressure meaning avoiding salty food as well as exercises on the regular basis.  On top of that he is scheduled to have a sleep study which I think is an excellent idea poorly controlled sleep apnea can contribute to difficulty controlling blood pressure. 2. Dyspnea exertion: Doing well from that point review. 3. Gout still a problem.  He is taking allopurinol.  I advised him also to try lidocaine patch and see if that helps with gout. 4. We did talk about healthy lifestyle need to exercise on the regular basis which he understand.   Medication Adjustments/Labs and Tests Ordered: Current medicines are reviewed at length with the patient today.  Concerns regarding medicines are outlined above.  Orders Placed This Encounter  Procedures  . Basic metabolic panel  . Lipid panel   Medication changes:  Meds ordered this encounter  Medications  . amLODipine (NORVASC) 5 MG tablet    Sig: Take 1 tablet (5 mg total) by mouth daily.    Dispense:  90 tablet    Refill:  3    Signed, Park Liter, MD, [facility] 06/22/2020 4:25 PM    [place]

## 2020-06-22 NOTE — Patient Instructions (Addendum)
Medication Instructions:  Your physician has recommended you make the following change in your medication:  1.  START Amlodipine 5 mg daily    *If you need a refill on your cardiac medications before your next appointment, please call your pharmacy*   Lab Work: TODAY:  BMET & LIPID  If you have labs (blood work) drawn today and your tests are completely normal, you will receive your results only by: Marland Kitchen MyChart Message (if you have MyChart) OR . A paper copy in the mail If you have any lab test that is abnormal or we need to change your treatment, we will call you to review the results.   Testing/Procedures: None ordered   Follow-Up: At [facility], you and your health needs are our priority.  As part of our continuing mission to provide you with exceptional heart care, we have created designated Provider Care Teams.  These Care Teams include your primary Cardiologist (physician) and Advanced Practice Providers (APPs -  Physician Assistants and Nurse Practitioners) who all work together to provide you with the care you need, when you need it.  We recommend signing up for the patient portal called "MyChart".  Sign up information is provided on this After Visit Summary.  MyChart is used to connect with patients for Virtual Visits (Telemedicine).  Patients are able to view lab/test results, encounter notes, upcoming appointments, etc.  Non-urgent messages can be sent to your provider as well.   To learn more about what you can do with MyChart, go to [email or web address].    Your next appointment:   2 month(s)  The format for your next appointment:   In Person  Provider:   Jenne Campus, MD   Other Instructions

## 2020-06-23 LAB — BASIC METABOLIC PANEL
BUN/Creatinine Ratio: 14 (ref 10–24)
BUN: 16 mg/dL (ref 8–27)
CO2: 26 mmol/L (ref 20–29)
Calcium: 9.7 mg/dL (ref 8.6–10.2)
Chloride: 101 mmol/L (ref 96–106)
Creatinine, Ser: 1.16 mg/dL (ref 0.76–1.27)
GFR calc Af Amer: 75 mL/min/{1.73_m2} (ref >59)
GFR calc non Af Amer: 65 mL/min/{1.73_m2} (ref >59)
Glucose: 84 mg/dL (ref 65–99)
Potassium: 4.5 mmol/L (ref 3.5–5.2)
Sodium: 140 mmol/L (ref 134–144)

## 2020-06-23 LAB — LIPID PANEL
Chol/HDL Ratio: 5.4 ratio — ABNORMAL HIGH (ref 0.0–5.0)
Cholesterol, Total: 215 mg/dL — ABNORMAL HIGH (ref 100–199)
HDL: 40 mg/dL (ref >39)
LDL Chol Calc (NIH): 150 mg/dL — ABNORMAL HIGH (ref 0–99)
Triglycerides: 139 mg/dL (ref 0–149)
VLDL Cholesterol Cal: 25 mg/dL (ref 5–40)

## 2020-06-25 ENCOUNTER — Other Ambulatory Visit: Payer: Self-pay

## 2020-06-25 ENCOUNTER — Ambulatory Visit: Payer: Medicare HMO

## 2020-06-25 DIAGNOSIS — R0781 Pleurodynia: Secondary | ICD-10-CM

## 2020-06-25 DIAGNOSIS — I69351 Hemiplegia and hemiparesis following cerebral infarction affecting right dominant side: Secondary | ICD-10-CM | POA: Diagnosis not present

## 2020-06-25 DIAGNOSIS — M25511 Pain in right shoulder: Secondary | ICD-10-CM

## 2020-06-25 DIAGNOSIS — R2681 Unsteadiness on feet: Secondary | ICD-10-CM

## 2020-06-25 DIAGNOSIS — M25552 Pain in left hip: Secondary | ICD-10-CM | POA: Diagnosis not present

## 2020-06-25 NOTE — Therapy (Signed)
[facility] [address] [place], [place], [address] Phone: [phone]   Fax:  [phone]  Physical Therapy Treatment  Patient Details  Name: Thomas [name] MRN: [id number] Date of Birth: 1951-03-09 Referring Provider (PT): Frann Rider, FNP   Encounter Date: 06/25/2020   PT End of Session - 06/25/20 0853    Visit Number 9    Number of Visits 13    Date for PT Re-Evaluation 07/23/20    Authorization Type 04/27/20 Eval; 5/32/99 Re-certification (1x/week for 6 wks)    Equipment Utilized During Treatment Gait belt    Activity Tolerance Patient tolerated treatment well    Behavior During Therapy [facility] for tasks assessed/performed           Past Medical History:  Diagnosis Date   Avulsed toenail, initial encounter 05/04/2018   Chronic gout    multiple sites--- followed by pcp--- (04-14-2018  per pt last flare-up , march 2019)   Dyspnea on exertion 02/10/2020   ED (erectile dysfunction)    Epidermoid cyst of neck 10/16/2017   Essential hypertension 07/21/2017   Gout 01/19/2014   Gout of multiple sites 07/21/2017   History of kidney stones    History of squamous cell carcinoma excision    Hyperlipidemia    Hypertension    Hypertension not at goal 01/19/2014   Left ureteral calculus    Lower abdominal pain 08/27/2015   Multiple joint pain 07/16/2018   Pain in joint, shoulder region 12/11/2015   Preventative health care 11/14/2016   Urgency of urination    URI (upper respiratory infection) 11/08/2014   Wears glasses     Past Surgical History:  Procedure Laterality Date   COLONOSCOPY WITH PROPOFOL  2017   CYSTOSCOPY/RETROGRADE/URETEROSCOPY/STONE EXTRACTION WITH BASKET Left 04/16/2018   Procedure: CYSTOSCOPY/RETROGRADE/URETEROSCOPY/STONE EXTRACTION WITH BASKET/ HOLMIUM LASER LITHOTRIPSY/ STENT PLACEMENT;  Surgeon: Ardis Hughs, MD;  Location: [facility];  Service: Urology;   Laterality: Left;   HOLMIUM LASER APPLICATION Left 2/42/6834   Procedure: HOLMIUM LASER APPLICATION;  Surgeon: Ardis Hughs, MD;  [facility];  Service: Urology;  Laterality: Left;    There were no vitals filed for this visit.   Subjective Assessment - 06/25/20 0852    Subjective Pt reports pain is not bothering him as much. He still gets soreness in bil hips at night.    Pertinent History past medical history of chronic arthritis, essential hypertension somewhat difficult to control on 4 medications at home, gout, hyperlipidemia,    How long can you sit comfortably? no issues    How long can you stand comfortably? 30 min    How long can you walk comfortably? 1 hour    Diagnostic tests MRI was performed that confirmed a left thalamic lacunar infarct.    Patient Stated Goals be able to walk 45 min a day, improve R shoulder and rib pain    Pain Onset More than a month ago                       Manual therapy: Myofascial release to clear restrictions in bil poseterior/inferiro rib cage borders and posterior and lateral iliac crest borders bil Myofascial release over sacral sulci bil                 PT Short Term Goals - 05/29/20 1652      PT SHORT TERM GOAL #1   Title Patient will score 24/24 on  DGI to improve dynamic gait in 3 weeks    Baseline 22/24 (eval)    Time 2    Period Weeks    Status Achieved    Target Date 05/11/20      PT SHORT TERM GOAL #2   Title Patient will be able to stand on one leg for 10 sec to improve overall balance.    Baseline 6-8 sec bil (eval)    Time 2    Period Weeks    Status Achieved    Target Date 05/11/20      PT SHORT TERM GOAL #3   Title Pt will report <6/10 pain in R ribs with forward reaching with R arm to improve function at home and work    Baseline 8/10 (eval); 3/10 05/29/20    Time 2    Period Weeks    Status Achieved    Target Date 05/11/20             PT Long Term  Goals - 06/11/20 2105      PT LONG TERM GOAL #1   Title Pt will report <2/10 pain intermittently, in R peiscapular region with daily functioning    Baseline 6/10 and constant (eval); no pain in periscapular region    Time 6    Period Weeks    Status Achieved      PT LONG TERM GOAL #2   Title Patient will report <0/10 pain in R ribs with functional reaching, pushing/pulling grocery cart to improve function    Baseline 8/10, 3/10 05/29/20    Time 6    Period Weeks    Status Revised      PT LONG TERM GOAL #3   Title Pt will report score of 80 or higher on FOTO to improve therapeutic outcomes.    Baseline 59    Time 6    Period Weeks    Status New      PT LONG TERM GOAL #4   Title Patient will report undistrubed sleep due to pain to improve sleeping    Baseline Wakes patient up 2-3x//night (06/11/20)    Time 6    Period Weeks                 Plan - 06/25/20 3825    Clinical Impression Statement increased myofascial restrictions still present in bil lateral iliac crest borders. Patient is gradually reporting improving pain.    Personal Factors and Comorbidities Comorbidity 3+    Comorbidities past medical history of chronic arthritis, essential hypertension somewhat difficult to control on 4 medications at home, gout, hyperlipidemia,    Examination-Activity Limitations Carry;Lift;Reach Overhead;Sleep;Locomotion Level    Examination-Participation Restrictions Cleaning;Community Activity;Driving;Laundry;Yard Work    Stability/Clinical Decision Making Evolving/Moderate complexity    Rehab Potential Good    PT Frequency 1x / week   2x/week for first 2 weeks and then 1x/week thereafter.   PT Duration 6 weeks    PT Treatment/Interventions ADLs/Self Care Home Management;Moist Heat;Gait training;Stair training;Functional mobility training;Therapeutic activities;Therapeutic exercise;Balance training;Neuromuscular re-education;Patient/family education;Manual techniques    PT Next  Visit Plan Manual therapy for R shoulder pain, R rib pain, issue HEP    PT Home Exercise Plan Work on walking program (pt educated to walk 5-10 min as long as walking doesn't irritate his gout) and then slowly build up his walking endurane, 1st rib self mobilization with scalene stretchAccess Code: [email or web address]: [email or web address]: 07/26/2021Prepared by: Gwenyth Bouillon PatelExercisesHip Flexor Stretch at [facility] of Bed - 1 x daily - 7  x weekly - 1 sets - 3 reps - 60 hold           Patient will benefit from skilled therapeutic intervention in order to improve the following deficits and impairments:  Abnormal gait, Decreased activity tolerance, Decreased endurance, Difficulty walking, Pain  Visit Diagnosis: Acute pain of right shoulder  Unsteadiness on feet  Pain in left hip  Hemiplegia and hemiparesis following cerebral infarction affecting right dominant side (HCC)  Rib pain on right side     Problem List Patient Active Problem List   Diagnosis Date Noted   Acute ischemic stroke ([place]) 03/22/2020   It band syndrome, left 03/19/2020   Metatarsalgia of both feet 03/19/2020   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 [place], [place], [address] Phone: [phone]   Fax:  [phone]  Name: JAILYN LANGHORST MRN: [id number] Date of Birth: 10-Sep-1951

## 2020-06-29 ENCOUNTER — Telehealth: Payer: Self-pay | Admitting: Emergency Medicine

## 2020-06-29 DIAGNOSIS — Z79899 Other long term (current) drug therapy: Secondary | ICD-10-CM

## 2020-06-29 NOTE — Telephone Encounter (Signed)
-----   Message from Park Liter, MD sent at 06/28/2020  9:40 PM EDT ----- His Chem-7 was normal however his cholesterol is not he need to start taking cholesterol medication.  I would recommend Lipitor 10 if he is willing to take it.  Fasting lipid profile need to be checked within the next 6 weeks

## 2020-06-29 NOTE — Telephone Encounter (Signed)
Called patient. Informed him of the test results and recommendation from Dr. Agustin Cree to start Lipitor. He reports that he previously took lipitor and it made his joints and jaw muscle hurt. Will consult to see if Dr. Agustin Cree will recommend a different medication.

## 2020-06-30 ENCOUNTER — Other Ambulatory Visit: Payer: Self-pay | Admitting: Family Medicine

## 2020-06-30 DIAGNOSIS — M2550 Pain in unspecified joint: Secondary | ICD-10-CM

## 2020-06-30 DIAGNOSIS — M10079 Idiopathic gout, unspecified ankle and foot: Secondary | ICD-10-CM

## 2020-07-02 ENCOUNTER — Other Ambulatory Visit: Payer: Self-pay

## 2020-07-02 ENCOUNTER — Ambulatory Visit: Payer: Medicare HMO | Admitting: Family Medicine

## 2020-07-02 VITALS — BP 160/90 | Ht 70.0 in | Wt 214.0 lb

## 2020-07-02 DIAGNOSIS — M25552 Pain in left hip: Secondary | ICD-10-CM

## 2020-07-02 MED ORDER — NORTRIPTYLINE HCL 25 MG PO CAPS: 25.0000 mg | ORAL_CAPSULE | Freq: Every day | ORAL | 2 refills | Status: DC

## 2020-07-02 NOTE — Patient Instructions (Signed)
You have IT band syndrome with trochanteric bursitis. Avoid painful activities as much as possible. Ice over area of pain 3-4 times a day for 15 minutes at a time Hip side raise exercise 3 sets of 10 once a day - add weights if this becomes too easy. Stretches - pick 2-3 and hold for 20-30 seconds x 3 - do once or twice a day. Tylenol as needed for pain. Consider topical voltaren gel for this also. Start nortriptyline 25mg  at bedtime - message or call me in a week to let me know how you're doing - we can go up on this if needed. If not improving, can consider steroid injection. Follow up with me in 1 month otherwise.

## 2020-07-02 NOTE — Telephone Encounter (Signed)
Requesting: tramadol  Contract: n/a UDS:03/22/20 Last Visit:02/02/20 Next Visit:n/a Last Refill:03/20/20  Please Advise

## 2020-07-03 ENCOUNTER — Encounter: Payer: Self-pay | Admitting: Family Medicine

## 2020-07-03 NOTE — Progress Notes (Signed)
PCP: Michael Boston, MD  Subjective:   HPI: Patient is a 69 y.o. male here for left hip pain.  Patient reports over the last several weeks his lateral left hip pain radiating down the leg to about the knee has worsened. He is not sleeping very well as a result of the pain. 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 He is taking Tylenol and using a lidocaine patch with both helping some. No numbness or tingling. No new low back pain. No bowel or bladder dysfunction. He also brought up that he does not generally deal with chronic pain related to his gout, underlying arthropathy.  He had previously been on tramadol though would like to explore other options.  Past Medical History:  Diagnosis Date  . Avulsed toenail, initial encounter 05/04/2018  . Chronic gout    multiple sites--- followed by pcp--- (04-14-2018  per pt last flare-up , march 2019)  . Dyspnea on exertion 02/10/2020  . ED (erectile dysfunction)   . Epidermoid cyst of neck 10/16/2017  . Essential hypertension 07/21/2017  . Gout 01/19/2014  . Gout of multiple sites 07/21/2017  . History of kidney stones   . History of squamous cell carcinoma excision   . Hyperlipidemia   . Hypertension   . Hypertension not at goal 01/19/2014  . Left ureteral calculus   . Lower abdominal pain 08/27/2015  . Multiple joint pain 07/16/2018  . Pain in joint, shoulder region 12/11/2015  . Preventative health care 11/14/2016  . Urgency of urination   . URI (upper respiratory infection) 11/08/2014  . Wears glasses     Current Outpatient Medications on File Prior to Visit  Medication Sig Dispense Refill  . allopurinol (ZYLOPRIM) 300 MG tablet Take 300 mg by mouth daily.    Marland Kitchen amLODipine (NORVASC) 5 MG tablet Take 1 tablet (5 mg total) by mouth daily. 90 tablet 3  . b complex vitamins tablet Take 1 tablet by mouth daily.    . Calcium-Magnesium-Vitamin D (CALCIUM  MAGNESIUM PO) Take 1 tablet by mouth daily.    . cloNIDine (CATAPRES) 0.1 MG tablet TAKE 1 TABLET BY MOUTH TWICE A DAY (Patient taking differently: Take 0.1 mg by mouth daily. ) 180 tablet 2  . colchicine 0.6 MG tablet TAKE 1 TABLET BY MOUTH EVERY DAY (Patient not taking: Reported on 06/22/2020) 90 tablet 2  . minoxidil (LONITEN) 2.5 MG tablet Take 2 tablets (5 mg total) by mouth 2 (two) times daily. 60 tablet 1  . valsartan (DIOVAN) 320 MG tablet TAKE 1 TABLET BY MOUTH EVERY DAY 90 tablet 0   No current facility-administered medications on file prior to visit.    Past Surgical History:  Procedure Laterality Date  . COLONOSCOPY WITH PROPOFOL  2017  . CYSTOSCOPY/RETROGRADE/URETEROSCOPY/STONE EXTRACTION WITH BASKET Left 04/16/2018   Procedure: CYSTOSCOPY/RETROGRADE/URETEROSCOPY/STONE EXTRACTION WITH BASKET/ HOLMIUM LASER LITHOTRIPSY/ STENT PLACEMENT;  Surgeon: Ardis Hughs, MD;  Location: [facility];  Service: Urology;  Laterality: Left;  . HOLMIUM LASER APPLICATION Left 01/22/6577   Procedure: HOLMIUM LASER APPLICATION;  Surgeon: Ardis Hughs, MD;  Location: [facility];  Service: Urology;  Laterality: Left;    No Known Allergies  Social History   Socioeconomic History  . Marital status: Married    Spouse name: Not on file  . Number of children: Not on file  . Years of education: Not on file  .  Highest education level: Not on file  Occupational History  . Occupation: Pharmacist, hospital  Tobacco Use  . Smoking status: Former Smoker    Packs/day: 0.30    Years: 20.00    Pack years: 6.00    Types: Cigarettes    Quit date: 10/17/1994    Years since quitting: 25.7  . Smokeless tobacco: Never Used  Vaping Use  . Vaping Use: Never assessed  Substance and Sexual Activity  . Alcohol use: Yes    Alcohol/week: 2.0 standard drinks    Types: 2 Glasses of wine per week    Comment: rare  . Drug use: No  . Sexual activity: Yes  Other Topics Concern  . Not  on file  Social History Narrative   Exercise 1 hour a day ---3-4 days a week   Social Determinants of Health   Financial Resource Strain:   . Difficulty of Paying Living Expenses:   Food Insecurity:   . Worried About Charity fundraiser in the Last Year:   . Arboriculturist in the Last Year:   Transportation Needs:   . Film/video editor (Medical):   Marland Kitchen Lack of Transportation (Non-Medical):   Physical Activity:   . Days of Exercise per Week:   . Minutes of Exercise per Session:   Stress:   . Feeling of Stress :   Social Connections:   . Frequency of Communication with Friends and Family:   . Frequency of Social Gatherings with Friends and Family:   . Attends Religious Services:   . Active Member of Clubs or Organizations:   . Attends Archivist Meetings:   Marland Kitchen Marital Status:   Intimate Partner Violence:   . Fear of Current or Ex-Partner:   . Emotionally Abused:   Marland Kitchen Physically Abused:   . Sexually Abused:     Family History  Problem Relation Age of Onset  . Heart disease Mother 58  . Alcohol abuse Mother   . Depression Mother   . Cancer Sister        hodgkins, breast  . Hypertension Father   . Colon cancer Neg Hx   . Pancreatic cancer Neg Hx   . Rectal cancer Neg Hx   . Stomach cancer Neg Hx     BP (!) 160/90   Ht 5\' 10"  (1.778 m)   Wt 214 lb (97.1 kg)   BMI 30.71 kg/m   Review of Systems: See HPI above.     Objective:  Physical Exam:  Gen: NAD, comfortable in exam room  Back: No gross deformity, scoliosis. No paraspinal TTP .  No midline or bony TTP. FROM without pain. Strength LEs 5/5 all muscle groups except hip abduction.   Negative SLRs. Sensation intact to light touch bilaterally.  Left hip: No deformity. FROM with 5/5 strength except 3/5 left hip abduction. TTP over greater trochanter.  No other tenderness. NVI distally. Negative logroll bilateral hips Negative fabers and piriformis stretches.   Assessment & Plan:  1.  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  We reviewed home exercises and stretches.  Icing, Tylenol, Voltaren gel, he can continue lidocaine patches also.  For this and for his underlying pain issues we also discussed starting nortriptyline which he will take 25 mg at bedtime and let me know how he is doing in a week.  We can consider a steroid injection if not improving also.  Follow-up in 1 month.

## 2020-07-06 ENCOUNTER — Ambulatory Visit: Payer: Medicare HMO

## 2020-07-06 ENCOUNTER — Other Ambulatory Visit: Payer: Self-pay

## 2020-07-06 ENCOUNTER — Ambulatory Visit (INDEPENDENT_AMBULATORY_CARE_PROVIDER_SITE_OTHER): Payer: Medicare HMO | Admitting: Family Medicine

## 2020-07-06 VITALS — BP 122/64 | Ht 70.0 in | Wt 214.0 lb

## 2020-07-06 DIAGNOSIS — M25552 Pain in left hip: Secondary | ICD-10-CM

## 2020-07-06 DIAGNOSIS — R0781 Pleurodynia: Secondary | ICD-10-CM

## 2020-07-06 DIAGNOSIS — I69351 Hemiplegia and hemiparesis following cerebral infarction affecting right dominant side: Secondary | ICD-10-CM | POA: Diagnosis not present

## 2020-07-06 DIAGNOSIS — M25511 Pain in right shoulder: Secondary | ICD-10-CM

## 2020-07-06 DIAGNOSIS — R2681 Unsteadiness on feet: Secondary | ICD-10-CM

## 2020-07-06 MED ORDER — METHYLPREDNISOLONE ACETATE 80 MG/ML IJ SUSP
80.0000 mg | Freq: Once | INTRAMUSCULAR | Status: AC
  Administered 2020-07-06: 80 mg via INTRA_ARTICULAR

## 2020-07-06 MED ORDER — PRAVASTATIN SODIUM 20 MG PO TABS
20.0000 mg | ORAL_TABLET | Freq: Every evening | ORAL | 1 refills | Status: DC
Start: 2020-07-06 — End: 2020-07-30

## 2020-07-06 NOTE — Telephone Encounter (Signed)
Ask him if he would be willing to try pravastatin 40 mg daily does have very gentle statin

## 2020-07-06 NOTE — Telephone Encounter (Signed)
Called patient and gave him Dr. Wendy Poet recommendation. He will start pravastatin 20 mg per Dr. Agustin Cree he verbally changed the dose. Patient will have labs drawn in  6 weeks no further questions.

## 2020-07-06 NOTE — Progress Notes (Signed)
Patient returned today for left greater trochanter injection.  After informed written consent timeout was performed, patient was lying on right side on exam table.  Area overlying left trochanteric bursa prepped with alcohol swab then utilizing ultrasound guidance was injected with 6:1 bupivicaine: depomedrol.  Patient tolerated procedure well without immediate complications.

## 2020-07-06 NOTE — Addendum Note (Signed)
Addended by: Ashok Norris on: 07/06/2020 02:15 PM   Modules accepted: Orders

## 2020-07-06 NOTE — Therapy (Signed)
[facility] [address] [place], [place], [address] Phone: [phone]   Fax:  [phone]  Physical Therapy Treatment  Patient Details  Name: Thomas [name] MRN: [id number] Date of Birth: 15-Apr-1951 Referring Provider (PT): Frann Rider, FNP   Encounter Date: 07/06/2020   PT End of Session - 07/06/20 1553    Visit Number 10    Number of Visits 13    Date for PT Re-Evaluation 07/23/20    Authorization Type 04/27/20 Eval; 4/74/25 Re-certification (1x/week for 6 wks)    PT Start Time 1540    PT Stop Time 1620    PT Time Calculation (min) 40 min    Equipment Utilized During Treatment Gait belt    Activity Tolerance Patient tolerated treatment well    Behavior During Therapy [facility] for tasks assessed/performed           Past Medical History:  Diagnosis Date  . Avulsed toenail, initial encounter 05/04/2018  . Chronic gout    multiple sites--- followed by pcp--- (04-14-2018  per pt last flare-up , march 2019)  . Dyspnea on exertion 02/10/2020  . ED (erectile dysfunction)   . Epidermoid cyst of neck 10/16/2017  . Essential hypertension 07/21/2017  . Gout 01/19/2014  . Gout of multiple sites 07/21/2017  . History of kidney stones   . History of squamous cell carcinoma excision   . Hyperlipidemia   . Hypertension   . Hypertension not at goal 01/19/2014  . Left ureteral calculus   . Lower abdominal pain 08/27/2015  . Multiple joint pain 07/16/2018  . Pain in joint, shoulder region 12/11/2015  . Preventative health care 11/14/2016  . Urgency of urination   . URI (upper respiratory infection) 11/08/2014  . Wears glasses     Past Surgical History:  Procedure Laterality Date  . COLONOSCOPY WITH PROPOFOL  2017  . CYSTOSCOPY/RETROGRADE/URETEROSCOPY/STONE EXTRACTION WITH BASKET Left 04/16/2018   Procedure: CYSTOSCOPY/RETROGRADE/URETEROSCOPY/STONE EXTRACTION WITH BASKET/ HOLMIUM LASER LITHOTRIPSY/ STENT PLACEMENT;  Surgeon:  Ardis Hughs, MD;  Location: [facility];  Service: Urology;  Laterality: Left;  . HOLMIUM LASER APPLICATION Left 9/56/3875   Procedure: HOLMIUM LASER APPLICATION;  Surgeon: Ardis Hughs, MD;  Location: [facility];  Service: Urology;  Laterality: Left;    There were no vitals filed for this visit.   Subjective Assessment - 07/06/20 1551    Subjective Pt reports his left hip was very sore after last time. He called his orthopedic who gave him muscle relaxants but it was affecting his mind and so he stopped taking them then ortho gave him steroid injection this morning which seems to be helping.    Pertinent History past medical history of chronic arthritis, essential hypertension somewhat difficult to control on 4 medications at home, gout, hyperlipidemia,    How long can you sit comfortably? no issues    How long can you stand comfortably? 30 min    How long can you walk comfortably? 1 hour    Diagnostic tests MRI was performed that confirmed a left thalamic lacunar infarct.    Patient Stated Goals be able to walk 45 min a day, improve R shoulder and rib pain    Pain Onset More than a month ago             Manually stretched bil hamstrings Supine piriformis stretch: 3 x 30" R and L Supine hamstring stretch with belt: 3 x 30" R and L Sidelying prop on elbow  to stretch lateral trunk: 3 x 30" R and L SLS with hp flexion, abduction, extension: 2 x 10 R and L  Pt educated on icing L hip if necessary at home and avoid sleepign on L side for next 2-3 days. Pt educated on sleeping on side with knees and ankles together and with pilllow between legs.      Access Code: [email or web address] URL: [email or web address] Date: 07/06/2020 Prepared by: Markus Jarvis  Exercises Supine Piriformis Stretch with Foot on Ground - 2 x daily - 7 x weekly - 3 reps - 30 hold Hooklying Hamstring Stretch with Strap - 2 x daily - 7 x weekly - 3 reps - 30  hold Sidelying propped on elbow back stretch - 2 x daily - 7 x weekly - 3 reps - 30 hold Standing Hip Flexion March - 1 x daily - 7 x weekly - 2 sets - 10 reps Standing Hip Abduction AROM - 1 x daily - 7 x weekly - 2 sets - 10 reps Standing Hip Extension with Chair - 1 x daily - 7 x weekly - 2 sets - 10 reps                  PT Short Term Goals - 05/29/20 1652      PT SHORT TERM GOAL #1   Title Patient will score 24/24 on DGI to improve dynamic gait in 3 weeks    Baseline 22/24 (eval)    Time 2    Period Weeks    Status Achieved    Target Date 05/11/20      PT SHORT TERM GOAL #2   Title Patient will be able to stand on one leg for 10 sec to improve overall balance.    Baseline 6-8 sec bil (eval)    Time 2    Period Weeks    Status Achieved    Target Date 05/11/20      PT SHORT TERM GOAL #3   Title Pt will report <6/10 pain in R ribs with forward reaching with R arm to improve function at home and work    Baseline 8/10 (eval); 3/10 05/29/20    Time 2    Period Weeks    Status Achieved    Target Date 05/11/20             PT Long Term Goals - 06/11/20 2105      PT LONG TERM GOAL #1   Title Pt will report <2/10 pain intermittently, in R peiscapular region with daily functioning    Baseline 6/10 and constant (eval); no pain in periscapular region    Time 6    Period Weeks    Status Achieved      PT LONG TERM GOAL #2   Title Patient will report <0/10 pain in R ribs with functional reaching, pushing/pulling grocery cart to improve function    Baseline 8/10, 3/10 05/29/20    Time 6    Period Weeks    Status Revised      PT LONG TERM GOAL #3   Title Pt will report score of 80 or higher on FOTO to improve therapeutic outcomes.    Baseline 59    Time 6    Period Weeks    Status New      PT LONG TERM GOAL #4   Title Patient will report undistrubed sleep due to pain to improve sleeping    Baseline Wakes patient up 2-3x//night (06/11/20)    Time  6     Period Weeks                 Plan - 07/06/20 1552    Clinical Impression Statement Pt is reproting decreasing pain after the cortisone shot in his left hip. patient continues to have tightness in hamstrings and piriformis bil. pt demo significant dififculty with dynamic balance activities on R LE compared to L LE    Personal Factors and Comorbidities Comorbidity 3+    Comorbidities past medical history of chronic arthritis, essential hypertension somewhat difficult to control on 4 medications at home, gout, hyperlipidemia,    Examination-Activity Limitations Carry;Lift;Reach Overhead;Sleep;Locomotion Level    Examination-Participation Restrictions Cleaning;Community Activity;Driving;Laundry;Yard Work    Stability/Clinical Decision Making Evolving/Moderate complexity    Rehab Potential Good    PT Frequency 1x / week   2x/week for first 2 weeks and then 1x/week thereafter.   PT Duration 6 weeks    PT Treatment/Interventions ADLs/Self Care Home Management;Moist Heat;Gait training;Stair training;Functional mobility training;Therapeutic activities;Therapeutic exercise;Balance training;Neuromuscular re-education;Patient/family education;Manual techniques    PT Next Visit Plan Manual therapy for R shoulder pain, R rib pain, issue HEP    PT Home Exercise Plan Work on walking program (pt educated to walk 5-10 min as long as walking doesn't irritate his gout) and then slowly build up his walking endurane, 1st rib self mobilization with scalene stretchAccess Code: [email or web address]: [email or web address]: 07/26/2021Prepared by: Gwenyth Bouillon PatelExercisesHip Flexor Stretch at [facility] of Bed - 1 x daily - 7 x weekly - 1 sets - 3 reps - 60 hold           Patient will benefit from skilled therapeutic intervention in order to improve the following deficits and impairments:  Abnormal gait, Decreased activity tolerance, Decreased endurance, Difficulty walking, Pain  Visit Diagnosis: Acute pain of  right shoulder  Unsteadiness on feet  Pain in left hip  Hemiplegia and hemiparesis following cerebral infarction affecting right dominant side (HCC)  Rib pain on right side     Problem List Patient Active Problem List   Diagnosis Date Noted  . Acute ischemic stroke ([place]) 03/22/2020  . It band syndrome, left 03/19/2020  . Metatarsalgia of both feet 03/19/2020  . 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 Dyspnea on exertion 02/10/2020  . Multiple joint pain 07/16/2018  . Avulsed toenail, initial encounter 05/04/2018  . Epidermoid cyst of neck 10/16/2017  . Essential hypertension 07/21/2017  . Gout of multiple sites 07/21/2017  . Preventative health care 11/14/2016  . Pain in joint, shoulder region 12/11/2015  . Lower abdominal pain 08/27/2015  . URI (upper respiratory infection) 11/08/2014  . Gout 01/19/2014  . Hypertension not at goal 01/19/2014    Kerrie Pleasure, PT 07/06/2020, 4:45 PM  [facility] [address] [place], [place], [address] Phone: [phone]   Fax:  [phone]  Name: ALFRED HARREL MRN: [id number] Date of Birth: Aug 15, 1951

## 2020-07-08 ENCOUNTER — Encounter: Payer: Self-pay | Admitting: Neurology

## 2020-07-09 ENCOUNTER — Ambulatory Visit: Payer: Medicare HMO

## 2020-07-09 ENCOUNTER — Other Ambulatory Visit: Payer: Self-pay

## 2020-07-09 DIAGNOSIS — R2681 Unsteadiness on feet: Secondary | ICD-10-CM

## 2020-07-09 DIAGNOSIS — I69351 Hemiplegia and hemiparesis following cerebral infarction affecting right dominant side: Secondary | ICD-10-CM

## 2020-07-09 DIAGNOSIS — M25511 Pain in right shoulder: Secondary | ICD-10-CM

## 2020-07-09 DIAGNOSIS — M25552 Pain in left hip: Secondary | ICD-10-CM | POA: Diagnosis not present

## 2020-07-09 DIAGNOSIS — R0781 Pleurodynia: Secondary | ICD-10-CM | POA: Diagnosis not present

## 2020-07-09 NOTE — Therapy (Signed)
[facility] [address] [place], [place], [address] Phone: [phone]   Fax:  [phone]  Physical Therapy Treatment  Patient Details  Name: Thomas [name] MRN: [id number] Date of Birth: 12-01-1950 Referring Provider (PT): Frann Rider, FNP   Encounter Date: 07/09/2020   PT End of Session - 07/09/20 1452    Visit Number 11    Number of Visits 13    Date for PT Re-Evaluation 07/23/20    Authorization Type 04/27/20 Eval; 7/42/59 Re-certification (1x/week for 6 wks)    PT Start Time 1445    PT Stop Time 1530    PT Time Calculation (min) 45 min    Equipment Utilized During Treatment Gait belt    Activity Tolerance Patient tolerated treatment well    Behavior During Therapy [facility] for tasks assessed/performed           Past Medical History:  Diagnosis Date  . Avulsed toenail, initial encounter 05/04/2018  . Chronic gout    multiple sites--- followed by pcp--- (04-14-2018  per pt last flare-up , march 2019)  . Dyspnea on exertion 02/10/2020  . ED (erectile dysfunction)   . Epidermoid cyst of neck 10/16/2017  . Essential hypertension 07/21/2017  . Gout 01/19/2014  . Gout of multiple sites 07/21/2017  . History of kidney stones   . History of squamous cell carcinoma excision   . Hyperlipidemia   . Hypertension   . Hypertension not at goal 01/19/2014  . Left ureteral calculus   . Lower abdominal pain 08/27/2015  . Multiple joint pain 07/16/2018  . Pain in joint, shoulder region 12/11/2015  . Preventative health care 11/14/2016  . Urgency of urination   . URI (upper respiratory infection) 11/08/2014  . Wears glasses     Past Surgical History:  Procedure Laterality Date  . COLONOSCOPY WITH PROPOFOL  2017  . CYSTOSCOPY/RETROGRADE/URETEROSCOPY/STONE EXTRACTION WITH BASKET Left 04/16/2018   Procedure: CYSTOSCOPY/RETROGRADE/URETEROSCOPY/STONE EXTRACTION WITH BASKET/ HOLMIUM LASER LITHOTRIPSY/ STENT PLACEMENT;  Surgeon:  Ardis Hughs, MD;  Location: [facility];  Service: Urology;  Laterality: Left;  . HOLMIUM LASER APPLICATION Left 5/63/8756   Procedure: HOLMIUM LASER APPLICATION;  Surgeon: Ardis Hughs, MD;  Location: [facility];  Service: Urology;  Laterality: Left;    There were no vitals filed for this visit.   Subjective Assessment - 07/09/20 1451    Subjective Pt reports that cortisone shot gave me an insomina for past couple of days. I slept better yesterday. I swam for the first time today and it helped me loosened up some of my thightness in my R side of back now.    Pertinent History past medical history of chronic arthritis, essential hypertension somewhat difficult to control on 4 medications at home, gout, hyperlipidemia,    How long can you sit comfortably? no issues    How long can you stand comfortably? 30 min    How long can you walk comfortably? 1 hour    Diagnostic tests MRI was performed that confirmed a left thalamic lacunar infarct.    Patient Stated Goals be able to walk 45 min a day, improve R shoulder and rib pain    Pain Onset More than a month ago           Manual therapy: Grade IV lateral hip distraction with internal rotation bias on R hip to improve internal rotation Grade IV lateral hip distraction with external rotation bias on L hip t improve external  rotation STM To L TFL  TherEx: SLS with contralateral hip fexion, abduction, extension: 2 x 10 R and L no HHA Seated manually resisted hip internal rotation: R leg 20x Bil leg press: 90lbs 2 x 10 Uni leg press: 50lbs 2 x 10 R and L                            PT Short Term Goals - 05/29/20 1652      PT SHORT TERM GOAL #1   Title Patient will score 24/24 on DGI to improve dynamic gait in 3 weeks    Baseline 22/24 (eval)    Time 2    Period Weeks    Status Achieved    Target Date 05/11/20      PT SHORT TERM GOAL #2   Title Patient will be able  to stand on one leg for 10 sec to improve overall balance.    Baseline 6-8 sec bil (eval)    Time 2    Period Weeks    Status Achieved    Target Date 05/11/20      PT SHORT TERM GOAL #3   Title Pt will report <6/10 pain in R ribs with forward reaching with R arm to improve function at home and work    Baseline 8/10 (eval); 3/10 05/29/20    Time 2    Period Weeks    Status Achieved    Target Date 05/11/20             PT Long Term Goals - 06/11/20 2105      PT LONG TERM GOAL #1   Title Pt will report <2/10 pain intermittently, in R peiscapular region with daily functioning    Baseline 6/10 and constant (eval); no pain in periscapular region    Time 6    Period Weeks    Status Achieved      PT LONG TERM GOAL #2   Title Patient will report <0/10 pain in R ribs with functional reaching, pushing/pulling grocery cart to improve function    Baseline 8/10, 3/10 05/29/20    Time 6    Period Weeks    Status Revised      PT LONG TERM GOAL #3   Title Pt will report score of 80 or higher on FOTO to improve therapeutic outcomes.    Baseline 59    Time 6    Period Weeks    Status New      PT LONG TERM GOAL #4   Title Patient will report undistrubed sleep due to pain to improve sleeping    Baseline Wakes patient up 2-3x//night (06/11/20)    Time 6    Period Weeks                 Plan - 07/09/20 1533    Clinical Impression Statement Improved hip internal rotation on R hip and external rotation on L hip noted after manual therapy. Patient gets fatigued quickly with R leg with balance exercises and with strength training exercises.    Personal Factors and Comorbidities Comorbidity 3+    Comorbidities past medical history of chronic arthritis, essential hypertension somewhat difficult to control on 4 medications at home, gout, hyperlipidemia,    Examination-Activity Limitations Carry;Lift;Reach Overhead;Sleep;Locomotion Level    Examination-Participation Restrictions  Cleaning;Community Activity;Driving;Laundry;Yard Work    Stability/Clinical Decision Making Evolving/Moderate complexity    Rehab Potential Good    PT Frequency 1x /  week   2x/week for first 2 weeks and then 1x/week thereafter.   PT Duration 6 weeks    PT Treatment/Interventions ADLs/Self Care Home Management;Moist Heat;Gait training;Stair training;Functional mobility training;Therapeutic activities;Therapeutic exercise;Balance training;Neuromuscular re-education;Patient/family education;Manual techniques    PT Next Visit Plan Manual therapy for R shoulder pain, R rib pain, issue HEP    PT Home Exercise Plan Work on walking program (pt educated to walk 5-10 min as long as walking doesn't irritate his gout) and then slowly build up his walking endurane, 1st rib self mobilization with scalene stretchAccess Code: [email or web address]: [email or web address]: 07/26/2021Prepared by: Gwenyth Bouillon PatelExercisesHip Flexor Stretch at [facility] of Bed - 1 x daily - 7 x weekly - 1 sets - 3 reps - 60 hold           Patient will benefit from skilled therapeutic intervention in order to improve the following deficits and impairments:  Abnormal gait, Decreased activity tolerance, Decreased endurance, Difficulty walking, Pain  Visit Diagnosis: Acute pain of right shoulder  Unsteadiness on feet  Pain in left hip  Hemiplegia and hemiparesis following cerebral infarction affecting right dominant side (HCC)  Rib pain on right side     Problem List Patient Active Problem List   Diagnosis Date Noted  . Acute ischemic stroke ([place]) 03/22/2020  . It band syndrome, left 03/19/2020  . Metatarsalgia of both feet 03/19/2020  . 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 Dyspnea on exertion 02/10/2020  . Multiple joint pain 07/16/2018  . Avulsed toenail, initial encounter 05/04/2018  . Epidermoid cyst of neck 10/16/2017  . Essential hypertension 07/21/2017  . Gout of multiple sites 07/21/2017    . Preventative health care 11/14/2016  . Pain in joint, shoulder region 12/11/2015  . Lower abdominal pain 08/27/2015  . URI (upper respiratory infection) 11/08/2014  . Gout 01/19/2014  . Hypertension not at goal 01/19/2014    Kerrie Pleasure 07/09/2020, 3:34 PM  [facility] [address] [place], [place], [address] Phone: [phone]   Fax:  [phone]  Name: Thomas [name] MRN: [id number] Date of Birth: 1950/12/22

## 2020-07-16 ENCOUNTER — Other Ambulatory Visit: Payer: Self-pay

## 2020-07-16 ENCOUNTER — Ambulatory Visit: Payer: Medicare HMO

## 2020-07-16 DIAGNOSIS — R0781 Pleurodynia: Secondary | ICD-10-CM | POA: Diagnosis not present

## 2020-07-16 DIAGNOSIS — I69351 Hemiplegia and hemiparesis following cerebral infarction affecting right dominant side: Secondary | ICD-10-CM

## 2020-07-16 DIAGNOSIS — M25511 Pain in right shoulder: Secondary | ICD-10-CM

## 2020-07-16 DIAGNOSIS — R2681 Unsteadiness on feet: Secondary | ICD-10-CM | POA: Diagnosis not present

## 2020-07-16 DIAGNOSIS — M25552 Pain in left hip: Secondary | ICD-10-CM

## 2020-07-16 NOTE — Therapy (Signed)
[facility] [address] [place], [place], [address] Phone: [phone]   Fax:  [phone]  Physical Therapy Treatment  Patient Details  Name: Thomas [name] MRN: [id number] Date of Birth: 1951/02/08 Referring Provider (PT): Frann Rider, FNP   Encounter Date: 07/16/2020   PT End of Session - 07/16/20 1529    Visit Number 12    Number of Visits 13    Date for PT Re-Evaluation 07/23/20    Authorization Type 04/27/20 Eval; 6/96/29 Re-certification (1x/week for 6 wks)    PT Start Time 1445    PT Stop Time 1530    PT Time Calculation (min) 45 min    Activity Tolerance Patient tolerated treatment well    Behavior During Therapy [facility] for tasks assessed/performed           Past Medical History:  Diagnosis Date   Avulsed toenail, initial encounter 05/04/2018   Chronic gout    multiple sites--- followed by pcp--- (04-14-2018  per pt last flare-up , march 2019)   Dyspnea on exertion 02/10/2020   ED (erectile dysfunction)    Epidermoid cyst of neck 10/16/2017   Essential hypertension 07/21/2017   Gout 01/19/2014   Gout of multiple sites 07/21/2017   History of kidney stones    History of squamous cell carcinoma excision    Hyperlipidemia    Hypertension    Hypertension not at goal 01/19/2014   Left ureteral calculus    Lower abdominal pain 08/27/2015   Multiple joint pain 07/16/2018   Pain in joint, shoulder region 12/11/2015   Preventative health care 11/14/2016   Urgency of urination    URI (upper respiratory infection) 11/08/2014   Wears glasses     Past Surgical History:  Procedure Laterality Date   COLONOSCOPY WITH PROPOFOL  2017   CYSTOSCOPY/RETROGRADE/URETEROSCOPY/STONE EXTRACTION WITH BASKET Left 04/16/2018   Procedure: CYSTOSCOPY/RETROGRADE/URETEROSCOPY/STONE EXTRACTION WITH BASKET/ HOLMIUM LASER LITHOTRIPSY/ STENT PLACEMENT;  Surgeon: Ardis Hughs, MD;  Location: [facility];  Service: Urology;  Laterality: Left;   HOLMIUM LASER APPLICATION Left 04/14/4131   Procedure: HOLMIUM LASER APPLICATION;  Surgeon: Ardis Hughs, MD;  Location: [facility];  Service: Urology;  Laterality: Left;    There were no vitals filed for this visit.   Subjective Assessment - 07/16/20 1515    Subjective I have been consistent with walking and swimming at least 5 days a week. I feeling some soreness in my left hip from all the activity but overall feeling better.    Pertinent History past medical history of chronic arthritis, essential hypertension somewhat difficult to control on 4 medications at home, gout, hyperlipidemia,    How long can you sit comfortably? no issues    How long can you stand comfortably? 30 min    How long can you walk comfortably? 1 hour    Diagnostic tests MRI was performed that confirmed a left thalamic lacunar infarct.    Patient Stated Goals be able to walk 45 min a day, improve R shoulder and rib pain    Pain Onset More than a month ago               Neuro re-ed: PNF Pt in L sidelying: scapular elevation and depressIOn; passive, active assisted and manualy resisted with slow reversal 20x Passive scapular depression holds to improve flexibility: 5 x 20" R only Light shoft tissue work to levator scapule insertion <2 min Pt in L sidelying with anterior elevation  and posterior depression of pelvis: passive, active assisted and manually resisted with slow reversal. Performed isometric holds with anterior elevation and posterior depression. Performed isometric holds with manually resisted hip flexion and eccentric hip extension on R LE  Therex: Bil leg press: 90lbs 2 x 10 Uni leg press: 70lbs 2 x 10 R and L, manual resistance provided behind R knee to improve quad activation during terminal knee extension  SLS with contralateral hip flexion, abduction, extension: no HHA: 2 x 10 R and  L                           PT Short Term Goals - 05/29/20 1652      PT SHORT TERM GOAL #1   Title Patient will score 24/24 on DGI to improve dynamic gait in 3 weeks    Baseline 22/24 (eval)    Time 2    Period Weeks    Status Achieved    Target Date 05/11/20      PT SHORT TERM GOAL #2   Title Patient will be able to stand on one leg for 10 sec to improve overall balance.    Baseline 6-8 sec bil (eval)    Time 2    Period Weeks    Status Achieved    Target Date 05/11/20      PT SHORT TERM GOAL #3   Title Pt will report <6/10 pain in R ribs with forward reaching with R arm to improve function at home and work    Baseline 8/10 (eval); 3/10 05/29/20    Time 2    Period Weeks    Status Achieved    Target Date 05/11/20             PT Long Term Goals - 06/11/20 2105      PT LONG TERM GOAL #1   Title Pt will report <2/10 pain intermittently, in R peiscapular region with daily functioning    Baseline 6/10 and constant (eval); no pain in periscapular region    Time 6    Period Weeks    Status Achieved      PT LONG TERM GOAL #2   Title Patient will report <0/10 pain in R ribs with functional reaching, pushing/pulling grocery cart to improve function    Baseline 8/10, 3/10 05/29/20    Time 6    Period Weeks    Status Revised      PT LONG TERM GOAL #3   Title Pt will report score of 80 or higher on FOTO to improve therapeutic outcomes.    Baseline 59    Time 6    Period Weeks    Status New      PT LONG TERM GOAL #4   Title Patient will report undistrubed sleep due to pain to improve sleeping    Baseline Wakes patient up 2-3x//night (06/11/20)    Time 6    Period Weeks                 Plan - 07/16/20 1528    Clinical Impression Statement Today's skilled session was focused on improving neuromuscular control in R hip, improving R single leg stance stability and progress strength training as tolerated by patient. Pt deonstrated improved  stability on R LE with SLS with hip flexion, abduction, extension.           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 [place], [place], [address] Phone: [phone]   Fax:  [phone]  Name: Thomas [name] MRN: [id number] Date of Birth: 17-Jul-1951

## 2020-07-24 ENCOUNTER — Ambulatory Visit: Payer: Medicare HMO | Attending: Adult Health

## 2020-07-24 DIAGNOSIS — M25552 Pain in left hip: Secondary | ICD-10-CM | POA: Insufficient documentation

## 2020-07-24 DIAGNOSIS — R0781 Pleurodynia: Secondary | ICD-10-CM | POA: Insufficient documentation

## 2020-07-24 DIAGNOSIS — I69351 Hemiplegia and hemiparesis following cerebral infarction affecting right dominant side: Secondary | ICD-10-CM | POA: Insufficient documentation

## 2020-07-24 DIAGNOSIS — R2681 Unsteadiness on feet: Secondary | ICD-10-CM | POA: Insufficient documentation

## 2020-07-24 DIAGNOSIS — M25511 Pain in right shoulder: Secondary | ICD-10-CM | POA: Insufficient documentation

## 2020-07-26 ENCOUNTER — Other Ambulatory Visit: Payer: Self-pay | Admitting: *Deleted

## 2020-07-26 ENCOUNTER — Telehealth: Payer: Self-pay

## 2020-07-26 MED ORDER — NORTRIPTYLINE HCL 25 MG PO CAPS: 25.0000 mg | ORAL_CAPSULE | Freq: Every day | ORAL | 2 refills | Status: DC

## 2020-07-26 NOTE — Telephone Encounter (Signed)
LVM for patient about rescheduling sleep study that he cancelled for the other night (due to hip pain)

## 2020-07-30 ENCOUNTER — Other Ambulatory Visit: Payer: Self-pay

## 2020-07-30 ENCOUNTER — Ambulatory Visit: Payer: Medicare HMO | Admitting: Family Medicine

## 2020-07-30 ENCOUNTER — Encounter: Payer: Self-pay | Admitting: Adult Health

## 2020-07-30 ENCOUNTER — Ambulatory Visit (INDEPENDENT_AMBULATORY_CARE_PROVIDER_SITE_OTHER): Payer: Medicare HMO | Admitting: Adult Health

## 2020-07-30 VITALS — BP 169/75 | HR 83 | Ht 70.0 in | Wt 210.0 lb

## 2020-07-30 VITALS — BP 156/76 | Ht 70.0 in | Wt 210.0 lb

## 2020-07-30 DIAGNOSIS — I639 Cerebral infarction, unspecified: Secondary | ICD-10-CM | POA: Diagnosis not present

## 2020-07-30 DIAGNOSIS — Z9189 Other specified personal risk factors, not elsewhere classified: Secondary | ICD-10-CM

## 2020-07-30 DIAGNOSIS — M25552 Pain in left hip: Secondary | ICD-10-CM | POA: Diagnosis not present

## 2020-07-30 DIAGNOSIS — I10 Essential (primary) hypertension: Secondary | ICD-10-CM

## 2020-07-30 DIAGNOSIS — E785 Hyperlipidemia, unspecified: Secondary | ICD-10-CM

## 2020-07-30 DIAGNOSIS — M792 Neuralgia and neuritis, unspecified: Secondary | ICD-10-CM

## 2020-07-30 DIAGNOSIS — I69398 Other sequelae of cerebral infarction: Secondary | ICD-10-CM

## 2020-07-30 DIAGNOSIS — I6381 Other cerebral infarction due to occlusion or stenosis of small artery: Secondary | ICD-10-CM

## 2020-07-30 MED ORDER — BACLOFEN 10 MG PO TABS: 10.0000 mg | ORAL_TABLET | Freq: Three times a day (TID) | ORAL | 1 refills | Status: DC | PRN

## 2020-07-30 MED ORDER — GABAPENTIN 300 MG PO CAPS: 300.0000 mg | ORAL_CAPSULE | Freq: Every day | ORAL | 2 refills | Status: DC

## 2020-07-30 NOTE — Patient Instructions (Addendum)
Speak further with your sports medicine doctor in regards to further pain management options which can include other antidepressants such as duloxetine or amitriptyline or muscle relaxant such as baclofen or tizanidine or nerve pain medicine such as gabapentin  Continue aspirin 81 mg daily  for secondary stroke prevention  Reschedule sleep evaluation with our sleep clinic once your pain improves  Continue to follow up with PCP regarding cholesterol and blood pressure management  Maintain strict control of hypertension with blood pressure goal below 130/90 and cholesterol with LDL cholesterol (bad cholesterol) goal below 70 mg/dL.       Followup in the future with me in 4 months or call earlier if needed       Thank you for coming to see [place] at [facility]. I hope we have been able to provide you high quality care today.  You may receive a patient satisfaction survey over the next few weeks. We would appreciate your feedback and comments so that we may continue to improve ourselves and the health of our patients.     Pain After a Stroke After a stroke, some people experience pain as well as numbness and tingling in the face, arms, legs, shoulders, or other parts of the body. Headaches are common after a bleeding (hemorrhagic) stroke. Pain may last for a long time (be chronic) after a stroke, or it may come and go. Pain can be present right after a stroke, or it may come later. What causes pain after a stroke?  Damage to the brain and nervous system can cause pain after a stroke. Weakness and inability to move (paralysis) on one side of the body can also cause pain. You may also have pain because of joint stiffness, muscle tightness, and limited movement after a stroke. What is central post-stroke pain? Central post-stroke pain (thalamic pain syndrome) happens if the part of the brain that processes information from the senses (thalamus) gets damaged during a stroke.  Central post-stroke pain can affect one or many body parts. Symptoms of central post-stroke pain may include:  Burning, numbness, or tingling pain in the face, arms, or legs.  Constant pain that does not go away.  Pain that ranges from moderate to severe.  Pain that gets worse when you are touched or moved or when the temperature changes (allodynia). How is pain after a stroke treated? Treatment for pain may include:  Antidepressant medicines.  Medicines that prevent seizures (anticonvulsant medicines).  Muscle relaxant medicines.  Injections of medicines to reduce inflammation, such as steroids or botulinum toxin.  Physical therapy to improve strength and range of motion.  Transcutaneous electrical nerve stimulation (TENS). This is the use of electrical currents to help the muscles and nerves.  Medicines for pain control, such as NSAIDs like aspirin or ibuprofen. How can I manage pain? You can manage pain by:  Lowering and managing your stress level. If you need help with this, ask your health care provider. Consider joining a stroke support group.  Taking over-the-counter and prescription medicines as told by your health care provider.  Doing physical activities such as exercises that your health care provider approves. Stretching exercises may help to relieve muscle pain. Summary  Pain may occur after a stroke.  Damage to the brain and nervous system can cause pain after a stroke.  Treatment for pain may include medicines, lowering stress, physical activity, and other options. This information is not intended to replace advice given to you by your health care provider.  Make sure you discuss any questions you have with your health care provider. Document Revised: 10/16/2017 Document Reviewed: 02/09/2017 Elsevier Patient Education  [place].

## 2020-07-30 NOTE — Patient Instructions (Signed)
Continue with physical therapy - the bursitis is improved but you still have pain from the IT band and hip external rotators being weak. Gabapentin 300mg  at bedtime. Baclofen as needed for muscle spasms. Heat as needed 15 minutes at a time. Let me know how you're doing otherwise follow up with me in 1 month.

## 2020-07-30 NOTE — Progress Notes (Signed)
[facility] [address] [place]. [place] [address] [phone]       STROKE FOLLOW UP NOTE  [name] Date of Birth:  [birth year] Medical Record Number:  [id number]   Reason for Referral: stroke follow up    SUBJECTIVE:   CHIEF COMPLAINT:  Chief Complaint  Patient presents with   Follow-up    rm 5   Cerebrovascular Accident    Residual right-sided deficits currently complicated by left hip pain    HPI:   Today, 07/30/2020, Thomas [name] returns for stroke follow-up unaccompanied  Reports residual deficit right-sided sensory impairment and imbalance Previously working with PT with improvement but currently placed on due to left hip pain -prior to stroke, L hip/IT band issues and this has worsened over the past 1.5 weeks - he believes due to increased exercise and exertion with PT Previously reported right shoulder pain greatly improved since prior visit with working with PT Feels tightness/pull or spasming sensation around right rib cage and into lower back and right thigh and knee especially towards the end of the day Plans on restarting PT tomorrow at neuro rehab  Orthopedics initiated nortriptyline for pain symptoms but limited benefit with pain and possible GI side effects - he self discontinued.  Continues on tramadol PRN managed by orthopedics with benefit Denies new or worsening stroke/TIA symptoms  Remains on aspirin 81 mg daily without bleeding or bruising Recent lipid panel showed uncontrolled HLD with LDL 150 and cardiology recommended initiating atorvastatin 10 mg daily but patient declined due to prior myalgias therefore initiated pravastatin but unable to tolerate due to jaw pain - improvement of symptoms after discontinuing. He does have follow up with cards on 08/22/2020.  Blood pressure today 169/75 - monitors at home and has been ranging 122-160/73-84 (per machine average recording).  Continues to follow closely with  cardiology  Evaluated by Dr. Rexene Alberts in 05/2020 due to concern of underlying sleep apnea.  Initial scheduled sleep apnea testing canceled recently due to increased left hip pain interfering with sleep. He has not rescheduled test at this time as he is currently waiting until improvement of pain.   No further concerns at this time      History provided for reference purposes only Initial visit 04/24/2020 JM: Thomas [name] is being seen for hospital follow-up. He has been doing well since discharge. He has continued to experience numbness and increased sensitivity on right side which typically worsens towards the end of the day but does report overall improvement.  He also has experienced increased fatigue and decreased activity tolerance.  Recently seen by sports medicine due to right-sided pains with underlying chronic pain history.  He plans on starting physical therapy in the near future. Completed 3 weeks DAPT and continues on aspirin alone without bleeding or bruising.  Self discontinued atorvastatin as he had difficulty tolerating due to myalgias. Since discontinuing, myalgias subsided.  No new statin has been started. He has made drastic dietary changes as he wishes to manage cholesterol by diet due to potential statin side effects. Blood pressure today 172/82.  Monitors at home and has been 140s at home. Currently being followed by cardiology with longstanding history of blood pressure management difficulty.  He endorses frustrations during today's visit as they have not been able to find a cause for continued uncontrolled blood pressures.  Previously discussed possible sleep apnea with cardiology but sleep study declined by insurance.  He does report snoring, daytime fatigue, witnessed apnea and occasional insomnia.  No further concerns at this time.  Stroke admission 03/22/2020 Thomas [name] is a 69 y.o. male with history of chronic arthritis, essential hypertension somewhat difficult to control  on 4 medications at home, gout, hyperlipidemia present on 03/22/2020 to [facility] ED with R sided numbness.  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  CTA negative LVO with marked right VA origin stenosis.  Initiated DAPT for 3 weeks and aspirin alone.  History of HTN stable during admission and recommended long-term BP goal normotensive range.  LDL 126 and initiated atorvastatin 40 mg daily.  Other stroke risk factors include advanced age, former tobacco use, EtOH use and obesity.  No prior history of stroke.  Other active problems include cervical spinal stenosis and left thyroid nodule as evidenced on MR CS and recommended PCP follow-up outpatient.  Evaluated by therapy who recommended outpatient PT and discharged home in stable condition.  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 Mild small vessel disease.   MR CS mild to moderate canal stenosis C3-4, mild C4-5, 5-6, 6-7. severe R C6-7 and moderate B C3-4, 4-5 foraminal stenosis. L thyroid nodule ([place] recommended)  CTA head & neck small L thalamic lacune. No LVO. Market R VA origin stenosis.  2D Echo EF 60-56%. No source of embolus   LDL 126  HgbA1c 6.3  Lovenox 40 mg sq daily for VTE prophylaxis  No antithrombotic prior to admission, now on aspirin 81 mg daily and clopidogrel 75 mg daily. Continue DAPT x 3 weeks then aspirin alone.    Therapy recommendations:  OP PT  Disposition:  Return home       ROS:   14 system review of systems performed and negative with exception of those listed in HPI  PMH:  Past Medical History:  Diagnosis Date   Avulsed toenail, initial encounter 05/04/2018   Chronic gout    multiple sites--- followed by pcp--- (04-14-2018  per pt last flare-up , march 2019)   Dyspnea on exertion 02/10/2020   ED (erectile dysfunction)    Epidermoid cyst of neck  10/16/2017   Essential hypertension 07/21/2017   Gout 01/19/2014   Gout of multiple sites 07/21/2017   History of kidney stones    History of squamous cell carcinoma excision    Hyperlipidemia    Hypertension    Hypertension not at goal 01/19/2014   Left ureteral calculus    Lower abdominal pain 08/27/2015   Multiple joint pain 07/16/2018   Pain in joint, shoulder region 12/11/2015   Preventative health care 11/14/2016   Urgency of urination    URI (upper respiratory infection) 11/08/2014   Wears glasses     PSH:  Past Surgical History:  Procedure Laterality Date   COLONOSCOPY WITH PROPOFOL  2017   CYSTOSCOPY/RETROGRADE/URETEROSCOPY/STONE EXTRACTION WITH BASKET Left 04/16/2018   Procedure: CYSTOSCOPY/RETROGRADE/URETEROSCOPY/STONE EXTRACTION WITH BASKET/ HOLMIUM LASER LITHOTRIPSY/ STENT PLACEMENT;  Surgeon: Ardis Hughs, MD;  Location: [facility];  Service: Urology;  Laterality: Left;   HOLMIUM LASER APPLICATION Left 8/56/3149   Procedure: HOLMIUM LASER APPLICATION;  Surgeon: Ardis Hughs, MD;  Location: [facility];  Service: Urology;  Laterality: Left;    Social History:  Social History   Socioeconomic History   Marital status: Married    Spouse name: Not on file   Number of children: Not on file   Years  of education: Not on file   Highest education level: Not on file  Occupational History   Occupation: teacher  Tobacco Use   Smoking status: Former Smoker    Packs/day: 0.30    Years: 20.00    Pack years: 6.00    Types: Cigarettes    Quit date: 10/17/1994    Years since quitting: 25.8   Smokeless tobacco: Never Used  Vaping Use   Vaping Use: Never assessed  Substance and Sexual Activity   Alcohol use: Yes    Alcohol/week: 2.0 standard drinks    Types: 2 Glasses of wine per week    Comment: rare   Drug use: No   Sexual activity: Yes  Other Topics Concern   Not on file  Social History Narrative    Exercise 1 hour a day ---3-4 days a week   Social Determinants of Health   Financial Resource Strain:    Difficulty of Paying Living Expenses: Not on file  Food Insecurity:    Worried About Charity fundraiser in the Last Year: Not on file   [employer] of Food in the Last Year: Not on file  Transportation Needs:    Lack of Transportation (Medical): Not on file   Lack of Transportation (Non-Medical): Not on file  Physical Activity:    Days of Exercise per Week: Not on file   Minutes of Exercise per Session: Not on file  Stress:    Feeling of Stress : Not on file  Social Connections:    Frequency of Communication with Friends and Family: Not on file   Frequency of Social Gatherings with Friends and Family: Not on file   Attends Religious Services: Not on file   Active Member of Clubs or Organizations: Not on file   Attends Archivist Meetings: Not on file   Marital Status: Not on file  Intimate Partner Violence:    Fear of Current or Ex-Partner: Not on file   Emotionally Abused: Not on file   Physically Abused: Not on file   Sexually Abused: Not on file    Family History:  Family History  Problem Relation Age of Onset   Heart disease Mother 56   Alcohol abuse Mother    Depression Mother    Cancer Sister        hodgkins, breast   Hypertension Father    Colon cancer Neg Hx    Pancreatic cancer Neg Hx    Rectal cancer Neg Hx    Stomach cancer Neg Hx     Medications:   Current Outpatient Medications on File Prior to Visit  Medication Sig Dispense Refill   allopurinol (ZYLOPRIM) 300 MG tablet Take 300 mg by mouth daily.     amLODipine (NORVASC) 5 MG tablet Take 1 tablet (5 mg total) by mouth daily. 90 tablet 3   b complex vitamins tablet Take 1 tablet by mouth daily.     Calcium-Magnesium-Vitamin D (CALCIUM MAGNESIUM PO) Take 1 tablet by mouth daily.     colchicine 0.6 MG tablet TAKE 1 TABLET BY MOUTH EVERY DAY 90 tablet 2    hydrochlorothiazide (HYDRODIURIL) 25 MG tablet Take 25 mg by mouth daily.     minoxidil (LONITEN) 2.5 MG tablet Take 2 tablets (5 mg total) by mouth 2 (two) times daily. 60 tablet 1   traMADol (ULTRAM) 50 MG tablet Take 1.5 tablets by mouth daily as needed.     valsartan (DIOVAN) 320 MG tablet TAKE 1 TABLET BY MOUTH  EVERY DAY 90 tablet 0   No current facility-administered medications on file prior to visit.    Allergies:   Allergies  Allergen Reactions   Statins     Jaw tightness and severe muscle pain- Pravastatin       OBJECTIVE:  Physical Exam  Vitals:   07/30/20 0731  BP: (!) 169/75  Pulse: 83  Weight: 210 lb (95.3 kg)  Height: 5\' 10"  (1.778 m)   Body mass index is 30.13 kg/m. No exam data present  General: well developed, well nourished, pleasant middle-age Caucasian male, seated, in no evident distress Head: head normocephalic and atraumatic.   Neck: supple with no carotid or supraclavicular bruits Cardiovascular: regular rate and rhythm, no murmurs Musculoskeletal: no deformity Skin:  no rash/petichiae Vascular:  Normal pulses all extremities   Neurologic Exam Mental Status: Awake and fully alert. Fluent speech and language. Oriented to place and time. Recent and remote memory intact. Attention span, concentration and fund of knowledge appropriate. Mood and affect appropriate.  Cranial Nerves: Pupils equal, briskly reactive to light. Extraocular movements full without nystagmus. Visual fields full to confrontation. Hearing intact. Facial sensation intact. Face, tongue, palate moves normally and symmetrically.  Motor: Normal bulk and tone. Normal strength in all tested extremity muscles. Sensory.:  Hypersensitivity to light touch on RLE but intact sensation RUE and left side Coordination: Rapid alternating movements normal in all extremities. Finger-to-nose and heel-to-shin performed accurately bilaterally. Gait and Station: Arises from chair without  difficulty. Stance is normal. Gait demonstrates normal stride length and balance without use of assistive device. Reflexes: 1+ and symmetric. Toes downgoing.         ASSESSMENT: Thomas [name] is a 69 y.o. 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 Vascular risk factors include HTN, HLD, advanced age, former tobacco use and EtOH use.      PLAN:  1. Left thalamic stroke:  a. Residual deficit: Right hypersensitivity with altered sensation and imbalance i. Continue working with outpatient PT for continued improvement ii. Discussed possible post stroke pain which may continue to improve with ongoing participation with outpatient therapy but may need assistance with medication management.  Discussed use of antidepressants such as tricyclics and SNRIs or muscle relaxants to help with pain.  Advised to further discuss with his sports medicine MD for further input in regards to possible benefit of left hip pain as well.  It was discussed that his tramadol prescription or any other opioid medications for pain management would not be managed through this office and will need to continue to follow with orthopedics for management or his PCP b. Continue aspirin 81 mg daily daily for secondary stroke prevention.   c. Discussion regarding use of statins for secondary stroke prevention and patient verbalizing understanding of importance but reluctant to try any additional statin medications.  He is frustrated that his cholesterol continues to be focused on as he believes other areas should be focused on first such as uncontrolled blood pressure and pain symptoms d. Discussed importance of close PCP follow-up for aggressive stroke risk factor management. 2. HTN:  a. BP goal<130/90.   b. Remains uncontrolled.  Discussed possible underlying sleep apnea may be contributing to uncontrolled  HTN c. Managed by cardiology. 3. HLD:  a. LDL goal <70.  Intolerant to atorvastatin and pravastatin.  Has follow-up next month with cardiology and discussed possible benefit with use of PCSK9 inhibitor due to  statin intolerance.  Discussed importance of managing cholesterol levels for secondary stroke prevention which patient verbalized understanding 4. High risk of sleep apnea:  a. Evaluated by Dr. Rexene Alberts in 05/2020 with initial sleep study canceled due to worsening of left hip pain.  Encouraged him to call office to reschedule once improvement of pain not interfering with sleep.    Follow up in 3 months or call earlier if needed   I spent 40 minutes of face-to-face and non-face-to-face time with patient.  This included previsit chart review, lab review, study review, order entry, electronic health record documentation, patient education/discussion regarding prior stroke and etiology, residual deficits and pain type symptoms, importance of managing stroke risk factors including HTN, HLD for possible underlying sleep apnea and answered all questions to patient satisfaction   Frann Rider, AGNP-BC  [facility] [address] [place], [place] [address]  Phone [phone] Fax [phone] Note: This document was prepared with digital dictation and possible smart phrase technology. Any transcriptional errors that result from this process are unintentional.

## 2020-07-30 NOTE — Progress Notes (Signed)
I agree with the above plan 

## 2020-07-31 ENCOUNTER — Ambulatory Visit: Payer: Medicare HMO

## 2020-07-31 ENCOUNTER — Encounter: Payer: Self-pay | Admitting: Family Medicine

## 2020-07-31 DIAGNOSIS — R0781 Pleurodynia: Secondary | ICD-10-CM

## 2020-07-31 DIAGNOSIS — M25552 Pain in left hip: Secondary | ICD-10-CM | POA: Diagnosis not present

## 2020-07-31 DIAGNOSIS — M25511 Pain in right shoulder: Secondary | ICD-10-CM | POA: Diagnosis not present

## 2020-07-31 DIAGNOSIS — I69351 Hemiplegia and hemiparesis following cerebral infarction affecting right dominant side: Secondary | ICD-10-CM | POA: Diagnosis not present

## 2020-07-31 DIAGNOSIS — R2681 Unsteadiness on feet: Secondary | ICD-10-CM | POA: Diagnosis not present

## 2020-07-31 NOTE — Progress Notes (Signed)
PCP: Michael Boston, MD  Subjective:   HPI: Patient is a 69 y.o. male here for left hip pain.  8/16: Patient reports over the last several weeks his lateral left hip pain radiating down the leg to about the knee has worsened. He is not sleeping very well as a result of the pain. 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 He is taking Tylenol and using a lidocaine patch with both helping some. No numbness or tingling. No new low back pain. No bowel or bladder dysfunction. He also brought up that he does not generally deal with chronic pain related to his gout, underlying arthropathy.  He had previously been on tramadol though would like to explore other options.  8/20: Greater trochanteric injection given.  9/13: Patient reports shot helped with pain on lateral hip over bony prominence but still having pain lateral left hip. Doing physical therapy. Has completely rested otherwise. Pain worse with hiking. Cannot sleep on left side still. Nortriptyline made him nauseous so he stopped this.  Past Medical History:  Diagnosis Date  . Avulsed toenail, initial encounter 05/04/2018  . Chronic gout    multiple sites--- followed by pcp--- (04-14-2018  per pt last flare-up , march 2019)  . Dyspnea on exertion 02/10/2020  . ED (erectile dysfunction)   . Epidermoid cyst of neck 10/16/2017  . Essential hypertension 07/21/2017  . Gout 01/19/2014  . Gout of multiple sites 07/21/2017  . History of kidney stones   . History of squamous cell carcinoma excision   . Hyperlipidemia   . Hypertension   . Hypertension not at goal 01/19/2014  . Left ureteral calculus   . Lower abdominal pain 08/27/2015  . Multiple joint pain 07/16/2018  . Pain in joint, shoulder region 12/11/2015  . Preventative health care 11/14/2016  . Urgency of urination   . URI (upper respiratory infection) 11/08/2014  . Wears glasses     Current  Outpatient Medications on File Prior to Visit  Medication Sig Dispense Refill  . allopurinol (ZYLOPRIM) 300 MG tablet Take 300 mg by mouth daily.    Marland Kitchen amLODipine (NORVASC) 5 MG tablet Take 1 tablet (5 mg total) by mouth daily. 90 tablet 3  . b complex vitamins tablet Take 1 tablet by mouth daily.    . Calcium-Magnesium-Vitamin D (CALCIUM MAGNESIUM PO) Take 1 tablet by mouth daily.    . colchicine 0.6 MG tablet TAKE 1 TABLET BY MOUTH EVERY DAY 90 tablet 2  . hydrochlorothiazide (HYDRODIURIL) 25 MG tablet Take 25 mg by mouth daily.    . minoxidil (LONITEN) 2.5 MG tablet Take 2 tablets (5 mg total) by mouth 2 (two) times daily. 60 tablet 1  . valsartan (DIOVAN) 320 MG tablet TAKE 1 TABLET BY MOUTH EVERY DAY 90 tablet 0   No current facility-administered medications on file prior to visit.    Past Surgical History:  Procedure Laterality Date  . COLONOSCOPY WITH PROPOFOL  2017  . CYSTOSCOPY/RETROGRADE/URETEROSCOPY/STONE EXTRACTION WITH BASKET Left 04/16/2018   Procedure: CYSTOSCOPY/RETROGRADE/URETEROSCOPY/STONE EXTRACTION WITH BASKET/ HOLMIUM LASER LITHOTRIPSY/ STENT PLACEMENT;  Surgeon: Ardis Hughs, MD;  Location: [facility];  Service: Urology;  Laterality: Left;  . HOLMIUM LASER APPLICATION Left 4/69/6295   Procedure: HOLMIUM LASER APPLICATION;  Surgeon: Ardis Hughs, MD;  Location: [facility];  Service: Urology;  Laterality: Left;    Allergies  Allergen Reactions  .  Statins     Jaw tightness and severe muscle pain- Pravastatin     Social History   Socioeconomic History  . Marital status: Married    Spouse name: Not on file  . Number of children: Not on file  . Years of education: Not on file  . Highest education level: Not on file  Occupational History  . Occupation: Pharmacist, hospital  Tobacco Use  . Smoking status: Former Smoker    Packs/day: 0.30    Years: 20.00    Pack years: 6.00    Types: Cigarettes    Quit date: 10/17/1994    Years  since quitting: 25.8  . Smokeless tobacco: Never Used  Vaping Use  . Vaping Use: Never assessed  Substance and Sexual Activity  . Alcohol use: Yes    Alcohol/week: 2.0 standard drinks    Types: 2 Glasses of wine per week    Comment: rare  . Drug use: No  . Sexual activity: Yes  Other Topics Concern  . Not on file  Social History Narrative   Exercise 1 hour a day ---3-4 days a week   Social Determinants of Health   Financial Resource Strain:   . Difficulty of Paying Living Expenses: Not on file  Food Insecurity:   . Worried About Charity fundraiser in the Last Year: Not on file  . Ran Out of Food in the Last Year: Not on file  Transportation Needs:   . Lack of Transportation (Medical): Not on file  . Lack of Transportation (Non-Medical): Not on file  Physical Activity:   . Days of Exercise per Week: Not on file  . Minutes of Exercise per Session: Not on file  Stress:   . Feeling of Stress : Not on file  Social Connections:   . Frequency of Communication with Friends and Family: Not on file  . Frequency of Social Gatherings with Friends and Family: Not on file  . Attends Religious Services: Not on file  . Active Member of Clubs or Organizations: Not on file  . Attends Archivist Meetings: Not on file  . Marital Status: Not on file  Intimate Partner Violence:   . Fear of Current or Ex-Partner: Not on file  . Emotionally Abused: Not on file  . Physically Abused: Not on file  . Sexually Abused: Not on file    Family History  Problem Relation Age of Onset  . Heart disease Mother 67  . Alcohol abuse Mother   . Depression Mother   . Cancer Sister        hodgkins, breast  . Hypertension Father   . Colon cancer Neg Hx   . Pancreatic cancer Neg Hx   . Rectal cancer Neg Hx   . Stomach cancer Neg Hx     BP (!) 156/76   Ht 5\' 10"  (1.778 m)   Wt 210 lb (95.3 kg)   BMI 30.13 kg/m   Review of Systems: See HPI above.     Objective:  Physical Exam:  Gen:  NAD, comfortable in exam room  Left hip: No deformity. FROM with 5/5 strength except 4/5 hip abduction, painful. TTP over glut medius and minimis, piriformis. NVI distally. Negative logroll. Negative faber, fadir. Negative SLR.   Assessment & Plan:  1. Left hip pain: Greater trochanteric pain syndrome improved but still with lateral hip pain.  Localized to gluteus musculature especially glut medius along with IT band, external rotators.  Continue physical therapy, restart home exercises for  this.  Did not tolerate nortriptyline.  Will trial gabapentin at bedtime with baclofen as needed for spasms.  F/u in 1 month.

## 2020-07-31 NOTE — Therapy (Signed)
[facility] [address] [place], [place], [address] Phone: [phone]   Fax:  [phone]  Physical Therapy Therapy Recertification Patient Details  Name: Thomas [name] MRN: [id number] Date of Birth: [birth year] Referring Provider (PT): Frann Rider, FNP   Encounter Date: 07/31/2020   PT End of Session - 07/31/20 1900    Visit Number 13    Number of Visits 19    Date for PT Re-Evaluation 07/23/20    Authorization Type 04/27/20 Eval; 9/37/90 Re-certification (1x/week for 6 wks); recertification 2/40/97 (1x/week for 6 more weeks)    Activity Tolerance Patient tolerated treatment well    Behavior During Therapy [facility] for tasks assessed/performed           Past Medical History:  Diagnosis Date   Avulsed toenail, initial encounter 05/04/2018   Chronic gout    multiple sites--- followed by pcp--- (04-14-2018  per pt last flare-up , march 2019)   Dyspnea on exertion 02/10/2020   ED (erectile dysfunction)    Epidermoid cyst of neck 10/16/2017   Essential hypertension 07/21/2017   Gout 01/19/2014   Gout of multiple sites 07/21/2017   History of kidney stones    History of squamous cell carcinoma excision    Hyperlipidemia    Hypertension    Hypertension not at goal 01/19/2014   Left ureteral calculus    Lower abdominal pain 08/27/2015   Multiple joint pain 07/16/2018   Pain in joint, shoulder region 12/11/2015   Preventative health care 11/14/2016   Urgency of urination    URI (upper respiratory infection) 11/08/2014   Wears glasses     Past Surgical History:  Procedure Laterality Date   COLONOSCOPY WITH PROPOFOL  2017   CYSTOSCOPY/RETROGRADE/URETEROSCOPY/STONE EXTRACTION WITH BASKET Left 04/16/2018   Procedure: CYSTOSCOPY/RETROGRADE/URETEROSCOPY/STONE EXTRACTION WITH BASKET/ HOLMIUM LASER LITHOTRIPSY/ STENT PLACEMENT;  Surgeon: Ardis Hughs, MD;  Location: [facility];  Service:  Urology;  Laterality: Left;   HOLMIUM LASER APPLICATION Left 3/53/2992   Procedure: HOLMIUM LASER APPLICATION;  Surgeon: Ardis Hughs, MD;  Location: [facility];  Service: Urology;  Laterality: Left;    There were no vitals filed for this visit.   Subjective Assessment - 07/31/20 1519    Subjective Taking a break from PT helped a little. Last thursday, he was able to sleep throught the night without pain. Then yesterday.    Pertinent History past medical history of chronic arthritis, essential hypertension somewhat difficult to control on 4 medications at home, gout, hyperlipidemia,    How long can you sit comfortably? no issues    How long can you stand comfortably? 30 min    How long can you walk comfortably? 1 hour    Diagnostic tests MRI was performed that confirmed a left thalamic lacunar infarct.    Patient Stated Goals be able to walk 45 min a day, improve R shoulder and rib pain    Pain Onset More than a month ago               Manual therapy Grade IV PA mobilization in lumbar spine and lower thoracic spine, T4-6  TherEx: Half kneeling TFL and quadratus lumborum stretch: 20 x 5" holds R and L Foam roller: pec/shoulder stretch: 2 x 2' Standing lumbar extension stretch: 5x                     PT Education - 07/31/20 1859    Education Details Pt educated  on continuing with swimming and walking at home. Pt educated on not to do any other exercises but the one reviewed today (half kneeling TFL/quadratus lumborum stretch)    Person(s) Educated Patient    Methods Explanation;Handout    Comprehension Verbalized understanding            PT Short Term Goals - 07/31/20 1901      PT SHORT TERM GOAL #1   Title Patient will score 24/24 on DGI to improve dynamic gait in 3 weeks    Baseline 22/24 (eval)    Time 2    Period Weeks    Status Achieved    Target Date 05/11/20      PT SHORT TERM GOAL #2   Title Patient will be able to  stand on one leg for 10 sec to improve overall balance.    Baseline 6-8 sec bil (eval)    Time 2    Period Weeks    Status Achieved    Target Date 05/11/20      PT SHORT TERM GOAL #3   Title Pt will report <6/10 pain in R ribs with forward reaching with R arm to improve function at home and work    Baseline 8/10 (eval); 3/10 05/29/20    Time 2    Period Weeks    Status Achieved    Target Date 05/11/20             PT Long Term Goals - 07/31/20 1902      PT LONG TERM GOAL #1   Title Patient will be able to sleep through the night without waking up from pain to improve sleeping habits.    Baseline wakes up 2-3x/night due to pain    Time 6    Period Weeks    Status Revised    Target Date 09/11/20      PT LONG TERM GOAL #2   Title Patient will be able to walk 4 miles a week to improve walking endurance    Baseline <2 miles /week    Time 6    Period Weeks    Status Revised    Target Date 09/11/20      PT LONG TERM GOAL #3   Title Pt will report score of 80 or higher on FOTO to improve therapeutic outcomes.    Baseline 59    Time 6    Period Weeks    Status Revised    Target Date 09/11/20                 Plan - 07/31/20 1906    Clinical Impression Statement Patient has been seen for total of 13 sessions from 04/27/20 to 07/31/20 due to decreased balance, bil hip pain, rib pain and R thoracic pain. Patient currently demonstrates moderate levels of pain in bil hips that prevents patient from sleeping through the night due to pain. Patient continues to report intermittent thoracic pain that affects ADLs and work activities. Patient has met all of his STG and new long term goals were established today. Patient will continue to benefit from skilled PT to improve bil hip pain and work towards his functional long term goals.    Personal Factors and Comorbidities Comorbidity 3+    Comorbidities past medical history of chronic arthritis, essential hypertension somewhat  difficult to control on 4 medications at home, gout, hyperlipidemia,    Examination-Activity Limitations Carry;Lift;Reach Overhead;Sleep;Locomotion Level    Examination-Participation Restrictions Cleaning;Community Activity;Driving;Laundry;[employer] Work  Stability/Clinical Decision Making Evolving/Moderate complexity    Rehab Potential Good    PT Frequency 1x / week   2x/week for first 2 weeks and then 1x/week thereafter.   PT Duration 6 weeks    PT Treatment/Interventions ADLs/Self Care Home Management;Moist Heat;Gait training;Stair training;Functional mobility training;Therapeutic activities;Therapeutic exercise;Balance training;Neuromuscular re-education;Patient/family education;Manual techniques    PT Next Visit Plan lumbar mobilization, work on hip pain bil    PT Home Exercise Plan Work on walking program (pt educated to walk 5-10 min as long as walking doesn't irritate his gout) and then slowly build up his walking endurane, 1st rib self mobilization with scalene stretchAccess Code: [email or web address]: [email or web address]: 07/26/2021Prepared by: Gwenyth Bouillon PatelExercisesHip Flexor Stretch at [facility] of Bed - 1 x daily - 7 x weekly - 1 sets - 3 reps - 60 hold    Consulted and Agree with Plan of Care Patient           Patient will benefit from skilled therapeutic intervention in order to improve the following deficits and impairments:  Abnormal gait, Decreased activity tolerance, Decreased endurance, Difficulty walking, Pain  Visit Diagnosis: Unsteadiness on feet  Acute pain of right shoulder  Pain in left hip  Hemiplegia and hemiparesis following cerebral infarction affecting right dominant side (HCC)  Rib pain on right side     Problem List Patient Active Problem List   Diagnosis Date Noted   Acute ischemic stroke ([place]) 03/22/2020   It band syndrome, left 03/19/2020   Metatarsalgia of both feet 03/19/2020   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    Dyspnea on exertion 02/10/2020   Multiple joint pain 07/16/2018   Avulsed toenail, initial encounter 05/04/2018   Epidermoid cyst of neck 10/16/2017   Essential hypertension 07/21/2017   Gout of multiple sites 07/21/2017   Preventative health care 11/14/2016   Pain in joint, shoulder region 12/11/2015   Lower abdominal pain 08/27/2015   URI (upper respiratory infection) 11/08/2014   Gout 01/19/2014   Hypertension not at goal 01/19/2014    Kerrie Pleasure, PT 07/31/2020, 7:07 PM  [facility] [address] [place], [place], [address] Phone: [phone]   Fax:  [phone]  Name: Thomas [name] MRN: [id number] Date of Birth: 11/12/1951

## 2020-08-02 ENCOUNTER — Other Ambulatory Visit: Payer: Self-pay | Admitting: Cardiology

## 2020-08-09 ENCOUNTER — Ambulatory Visit: Payer: Medicare HMO

## 2020-08-13 ENCOUNTER — Ambulatory Visit: Payer: Medicare HMO

## 2020-08-19 DIAGNOSIS — R69 Illness, unspecified: Secondary | ICD-10-CM | POA: Diagnosis not present

## 2020-08-20 ENCOUNTER — Encounter: Payer: Self-pay | Admitting: Adult Health

## 2020-08-20 ENCOUNTER — Other Ambulatory Visit: Payer: Self-pay

## 2020-08-20 ENCOUNTER — Ambulatory Visit: Payer: Medicare HMO | Attending: Adult Health

## 2020-08-20 DIAGNOSIS — M25552 Pain in left hip: Secondary | ICD-10-CM | POA: Diagnosis not present

## 2020-08-20 DIAGNOSIS — R0781 Pleurodynia: Secondary | ICD-10-CM | POA: Insufficient documentation

## 2020-08-20 DIAGNOSIS — R2681 Unsteadiness on feet: Secondary | ICD-10-CM | POA: Diagnosis not present

## 2020-08-20 DIAGNOSIS — M25511 Pain in right shoulder: Secondary | ICD-10-CM | POA: Diagnosis not present

## 2020-08-20 DIAGNOSIS — I69351 Hemiplegia and hemiparesis following cerebral infarction affecting right dominant side: Secondary | ICD-10-CM

## 2020-08-20 NOTE — Therapy (Signed)
[facility] [address] [place], [place], [address] Phone: [phone]   Fax:  [phone]  Physical Therapy Treatment  Patient Details  Name: Thomas [name] MRN: [id number] Date of Birth: 1951/05/09 Referring Provider (PT): Frann Rider, FNP   Encounter Date: 08/20/2020   PT End of Session - 08/20/20 1645    Visit Number 14    Number of Visits 19    Date for PT Re-Evaluation 07/23/20    Authorization Type 04/27/20 Eval; 5/39/76 Re-certification (1x/week for 6 wks); recertification 7/34/19 (1x/week for 6 more weeks)    PT Start Time 3790    PT Stop Time 2409    PT Time Calculation (min) 45 min    Activity Tolerance Patient tolerated treatment well    Behavior During Therapy [facility] for tasks assessed/performed           Past Medical History:  Diagnosis Date   Avulsed toenail, initial encounter 05/04/2018   Chronic gout    multiple sites--- followed by pcp--- (04-14-2018  per pt last flare-up , march 2019)   Dyspnea on exertion 02/10/2020   ED (erectile dysfunction)    Epidermoid cyst of neck 10/16/2017   Essential hypertension 07/21/2017   Gout 01/19/2014   Gout of multiple sites 07/21/2017   History of kidney stones    History of squamous cell carcinoma excision    Hyperlipidemia    Hypertension    Hypertension not at goal 01/19/2014   Left ureteral calculus    Lower abdominal pain 08/27/2015   Multiple joint pain 07/16/2018   Pain in joint, shoulder region 12/11/2015   Preventative health care 11/14/2016   Urgency of urination    URI (upper respiratory infection) 11/08/2014   Wears glasses     Past Surgical History:  Procedure Laterality Date   COLONOSCOPY WITH PROPOFOL  2017   CYSTOSCOPY/RETROGRADE/URETEROSCOPY/STONE EXTRACTION WITH BASKET Left 04/16/2018   Procedure: CYSTOSCOPY/RETROGRADE/URETEROSCOPY/STONE EXTRACTION WITH BASKET/ HOLMIUM LASER LITHOTRIPSY/ STENT PLACEMENT;  Surgeon:  Ardis Hughs, MD;  Location: [facility];  Service: Urology;  Laterality: Left;   HOLMIUM LASER APPLICATION Left 7/35/3299   Procedure: HOLMIUM LASER APPLICATION;  Surgeon: Ardis Hughs, MD;  Location: [facility];  Service: Urology;  Laterality: Left;    There were no vitals filed for this visit.   Subjective Assessment - 08/20/20 1639    Subjective Pt reports that he is not doing too well. Pt reports that for past 2 weeks, he is having tightness and pain in bil anterior hip. He feels that he can't take long strides because of tightness in front hips. Occaisonally he feels pain in lateral/posterior R hip. He is also feeling muscle cramping/pain in R anterior thigh and R calf. Pt is reporting that he is still feeling pain in R lower back and rib cage. Pt reports that he saw his doctor in last 2 weeks and they gave him muscle relaxants and gabapentin which has helped to sleep better for past 2 days as it was affecting his sleep before. Pt has stopped all the exercises and walking as he was afraid that it was going to make it worst.    Pertinent History past medical history of chronic arthritis, essential hypertension somewhat difficult to control on 4 medications at home, gout, hyperlipidemia,    How long can you sit comfortably? no issues    How long can you stand comfortably? 30 min    How long can you walk comfortably? 1  hour    Diagnostic tests MRI was performed that confirmed a left thalamic lacunar infarct.    Patient Stated Goals be able to walk 45 min a day, improve R shoulder and rib pain    Pain Onset More than a month ago                Pain neuroscience education: - Educated pt on tissue sensitivity and how it increases due to prolonged pain in chronic pain. Pt educated on how emotional factors also affect tissue sensitivity. Tissue sensitivity is not constant and it can be altered with improving knowledge about pain, understanding  physiologic limits by modifying activity level to make sure his pain doesn't increase from baseline with activities, improving sleeping habits, and by gradually promoting movement that doesn't elicit painful response. - Pt educated on focusing on movements that doesn't increase his pain from baseline and gradually increasing intensity and frequency of those movements to improve positive reinforcement Pt educated on benefits of proper sleeping. - pt educated on tissue healing time frames and at this point, there is most likely no futher tissue healing that will occur and his pain response is outside normal limits of tissue healing. This is also another reason for his altered tissue sensitivity    Pt educated on continue with his short walks and aquatic exercises/walking to promote movement.                    PT Short Term Goals - 07/31/20 1901      PT SHORT TERM GOAL #1   Title Patient will score 24/24 on DGI to improve dynamic gait in 3 weeks    Baseline 22/24 (eval)    Time 2    Period Weeks    Status Achieved    Target Date 05/11/20      PT SHORT TERM GOAL #2   Title Patient will be able to stand on one leg for 10 sec to improve overall balance.    Baseline 6-8 sec bil (eval)    Time 2    Period Weeks    Status Achieved    Target Date 05/11/20      PT SHORT TERM GOAL #3   Title Pt will report <6/10 pain in R ribs with forward reaching with R arm to improve function at home and work    Baseline 8/10 (eval); 3/10 05/29/20    Time 2    Period Weeks    Status Achieved    Target Date 05/11/20             PT Long Term Goals - 07/31/20 1902      PT LONG TERM GOAL #1   Title Patient will be able to sleep through the night without waking up from pain to improve sleeping habits.    Baseline wakes up 2-3x/night due to pain    Time 6    Period Weeks    Status Revised    Target Date 09/11/20      PT LONG TERM GOAL #2   Title Patient will be able to walk 4 miles  a week to improve walking endurance    Baseline <2 miles /week    Time 6    Period Weeks    Status Revised    Target Date 09/11/20      PT LONG TERM GOAL #3   Title Pt will report score of 80 or higher on FOTO to improve therapeutic outcomes.  Baseline 59    Time 6    Period Weeks    Status Revised    Target Date 09/11/20                 Plan - 08/20/20 1641    Clinical Impression Statement Patient reported tenderness over greater trochanters bil. No significant tenderness in bil anterior hips. HIp ROM was WNL and his gait was [facility] without significant limp. Due to prolonged duration of pain, patient was educated on neuropatophysiology of pain in chronic state. Patient was educated on Pain neuroscience education to improve his understanding of chronic pain in hopes to decrease tissue sensitivity and encourage movements that doesn't induce more pain. Pt verbalized understanding and will continue to benefit from Pain neuroscience education to improve his tissue sensitivity to reduce pain and improve function    Personal Factors and Comorbidities Comorbidity 3+    Comorbidities past medical history of chronic arthritis, essential hypertension somewhat difficult to control on 4 medications at home, gout, hyperlipidemia,    Examination-Activity Limitations Carry;Lift;Reach Overhead;Sleep;Locomotion Level    Examination-Participation Restrictions Cleaning;Community Activity;Driving;Laundry;Yard Work    Stability/Clinical Decision Making Evolving/Moderate complexity    Rehab Potential Good    PT Frequency 1x / week   2x/week for first 2 weeks and then 1x/week thereafter.   PT Duration 6 weeks    PT Treatment/Interventions ADLs/Self Care Home Management;Moist Heat;Gait training;Stair training;Functional mobility training;Therapeutic activities;Therapeutic exercise;Balance training;Neuromuscular re-education;Patient/family education;Manual techniques    PT Next Visit Plan lumbar  mobilization, work on hip pain bil    PT Home Exercise Plan Work on walking program (pt educated to walk 5-10 min as long as walking doesn't irritate his gout) and then slowly build up his walking endurane, 1st rib self mobilization with scalene stretchAccess Code: [email or web address]: [email or web address]: 07/26/2021Prepared by: Gwenyth Bouillon PatelExercisesHip Flexor Stretch at [facility] of Bed - 1 x daily - 7 x weekly - 1 sets - 3 reps - 60 hold    Consulted and Agree with Plan of Care Patient           Patient will benefit from skilled therapeutic intervention in order to improve the following deficits and impairments:  Abnormal gait, Decreased activity tolerance, Decreased endurance, Difficulty walking, Pain  Visit Diagnosis: Unsteadiness on feet  Acute pain of right shoulder  Pain in left hip  Hemiplegia and hemiparesis following cerebral infarction affecting right dominant side (HCC)  Rib pain on right side     Problem List Patient Active Problem List   Diagnosis Date Noted   Acute ischemic stroke ([place]) 03/22/2020   It band syndrome, left 03/19/2020   Metatarsalgia of both feet 03/19/2020   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   Dyspnea on exertion 02/10/2020   Multiple joint pain 07/16/2018   Avulsed toenail, initial encounter 05/04/2018   Epidermoid cyst of neck 10/16/2017   Essential hypertension 07/21/2017   Gout of multiple sites 07/21/2017   Preventative health care 11/14/2016   Pain in joint, shoulder region 12/11/2015   Lower abdominal pain 08/27/2015   URI (upper respiratory infection) 11/08/2014   Gout 01/19/2014   Hypertension not at goal 01/19/2014    Kerrie Pleasure, PT 08/20/2020, 4:46 PM  [facility] [address] [place], [place], [address] Phone: [phone]   Fax:  [phone]  Name: DAVIE CLAUD MRN: [id number] Date of Birth: March 08, 1951

## 2020-08-21 DIAGNOSIS — M150 Primary generalized (osteo)arthritis: Secondary | ICD-10-CM | POA: Diagnosis not present

## 2020-08-21 DIAGNOSIS — E663 Overweight: Secondary | ICD-10-CM | POA: Diagnosis not present

## 2020-08-21 DIAGNOSIS — M1A09X0 Idiopathic chronic gout, multiple sites, without tophus (tophi): Secondary | ICD-10-CM | POA: Diagnosis not present

## 2020-08-21 DIAGNOSIS — Z6829 Body mass index (BMI) 29.0-29.9, adult: Secondary | ICD-10-CM | POA: Diagnosis not present

## 2020-08-21 DIAGNOSIS — M2550 Pain in unspecified joint: Secondary | ICD-10-CM | POA: Diagnosis not present

## 2020-08-22 ENCOUNTER — Ambulatory Visit: Payer: Medicare HMO | Admitting: Cardiology

## 2020-08-24 ENCOUNTER — Encounter: Payer: Self-pay | Admitting: Cardiology

## 2020-08-24 ENCOUNTER — Ambulatory Visit: Payer: Medicare HMO | Admitting: Cardiology

## 2020-08-24 ENCOUNTER — Other Ambulatory Visit: Payer: Self-pay

## 2020-08-24 DIAGNOSIS — R0600 Dyspnea, unspecified: Secondary | ICD-10-CM

## 2020-08-24 DIAGNOSIS — I10 Essential (primary) hypertension: Secondary | ICD-10-CM | POA: Diagnosis not present

## 2020-08-24 DIAGNOSIS — I6930 Unspecified sequelae of cerebral infarction: Secondary | ICD-10-CM | POA: Diagnosis not present

## 2020-08-24 DIAGNOSIS — M2550 Pain in unspecified joint: Secondary | ICD-10-CM

## 2020-08-24 DIAGNOSIS — R0609 Other forms of dyspnea: Secondary | ICD-10-CM

## 2020-08-24 HISTORY — DX: Unspecified sequelae of cerebral infarction: I69.30

## 2020-08-24 MED ORDER — EZETIMIBE 10 MG PO TABS: 10.0000 mg | ORAL_TABLET | Freq: Every day | ORAL | 1 refills | Status: DC

## 2020-08-24 NOTE — Addendum Note (Signed)
Addended by: Senaida Ores on: 08/24/2020 01:48 PM   Modules accepted: Orders

## 2020-08-24 NOTE — Progress Notes (Signed)
Cardiology Office Note:    Date:  08/24/2020   ID:  Thomas [name], DOB 11/13/1951, MRN [id number]  PCP:  Michael Boston, MD  Cardiologist:  Jenne Campus, MD    Referring MD: Michael Boston, MD   No chief complaint on file. I am doing well  History of Present Illness:    Thomas [name] is a 69 y.o. male past medical history significant for essential hypertension which is somewhat difficult to manage, work-up for secondary hypertension has been unrevealing, also remote history of CVA.  Dyslipidemia.  He comes today to my for follow-up.  He did suffer from gout which forces to discontinue his diuretic, however gout being treated he is taking appropriate medications does not have recurrences in his diuretic has been restarted.  Since that time he is doing well his blood pressures well controlled.  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  Past Medical History:  Diagnosis Date  . Avulsed toenail, initial encounter 05/04/2018  . Chronic gout    multiple sites--- followed by pcp--- (04-14-2018  per pt last flare-up , march 2019)  . Dyspnea on exertion 02/10/2020  . ED (erectile dysfunction)   . Epidermoid cyst of neck 10/16/2017  . Essential hypertension 07/21/2017  . Gout 01/19/2014  . Gout of multiple sites 07/21/2017  . History of kidney stones   . History of squamous cell carcinoma excision   . Hyperlipidemia   . Hypertension   . Hypertension not at goal 01/19/2014  . Left ureteral calculus   . Lower abdominal pain 08/27/2015  . Multiple joint pain 07/16/2018  . Pain in joint, shoulder region 12/11/2015  . Preventative health care 11/14/2016  . Urgency of urination   . URI (upper respiratory infection) 11/08/2014  . Wears glasses     Past Surgical History:  Procedure Laterality Date  . COLONOSCOPY WITH PROPOFOL  2017  . CYSTOSCOPY/RETROGRADE/URETEROSCOPY/STONE EXTRACTION WITH BASKET Left 04/16/2018   Procedure:  CYSTOSCOPY/RETROGRADE/URETEROSCOPY/STONE EXTRACTION WITH BASKET/ HOLMIUM LASER LITHOTRIPSY/ STENT PLACEMENT;  Surgeon: Ardis Hughs, MD;  Location: [facility];  Service: Urology;  Laterality: Left;  . HOLMIUM LASER APPLICATION Left 05/13/349   Procedure: HOLMIUM LASER APPLICATION;  Surgeon: Ardis Hughs, MD;  Location: [facility];  Service: Urology;  Laterality: Left;    Current Medications: Current Meds  Medication Sig  . allopurinol (ZYLOPRIM) 300 MG tablet Take 300 mg by mouth daily.  Marland Kitchen amLODipine (NORVASC) 5 MG tablet Take 1 tablet (5 mg total) by mouth daily.  Marland Kitchen b complex vitamins tablet Take 1 tablet by mouth daily.  . Calcium-Magnesium-Vitamin D (CALCIUM MAGNESIUM PO) Take 1 tablet by mouth daily.  . hydrochlorothiazide (HYDRODIURIL) 25 MG tablet Take 25 mg by mouth daily.  . minoxidil (LONITEN) 2.5 MG tablet TAKE 2 TABLETS BY MOUTH 2 TIMES DAILY.  . traMADol (ULTRAM) 50 MG tablet Take 50 mg by mouth 2 (two) times daily.  . valsartan (DIOVAN) 320 MG tablet TAKE 1 TABLET BY MOUTH EVERY DAY     Allergies:   Statins   Social History   Socioeconomic History  . Marital status: Married    Spouse name: Not on file  . Number of children: Not on file  . Years of education: Not on file  . Highest education level: Not on file  Occupational History  . Occupation: Pharmacist, hospital  Tobacco Use  . Smoking status:  Former Smoker    Packs/day: 0.30    Years: 20.00    Pack years: 6.00    Types: Cigarettes    Quit date: 10/17/1994    Years since quitting: 25.8  . Smokeless tobacco: Never Used  Vaping Use  . Vaping Use: Never assessed  Substance and Sexual Activity  . Alcohol use: Yes    Alcohol/week: 2.0 standard drinks    Types: 2 Glasses of wine per week    Comment: rare  . Drug use: No  . Sexual activity: Yes  Other Topics Concern  . Not on file  Social History Narrative   Exercise 1 hour a day ---3-4 days a week   Social Determinants of  Health   Financial Resource Strain:   . Difficulty of Paying Living Expenses: Not on file  Food Insecurity:   . Worried About Charity fundraiser in the Last Year: Not on file  . Ran Out of Food in the Last Year: Not on file  Transportation Needs:   . Lack of Transportation (Medical): Not on file  . Lack of Transportation (Non-Medical): Not on file  Physical Activity:   . Days of Exercise per Week: Not on file  . Minutes of Exercise per Session: Not on file  Stress:   . Feeling of Stress : Not on file  Social Connections:   . Frequency of Communication with Friends and Family: Not on file  . Frequency of Social Gatherings with Friends and Family: Not on file  . Attends Religious Services: Not on file  . Active Member of Clubs or Organizations: Not on file  . Attends Archivist Meetings: Not on file  . Marital Status: Not on file     Family History: The patient's family history includes Alcohol abuse in his mother; Cancer in his sister; Depression in his mother; Heart disease (age of onset: 62) in his mother; Hypertension in his father. There is no history of Colon cancer, Pancreatic cancer, Rectal cancer, or Stomach cancer. ROS:   Please see the history of present illness.    All 14 point review of systems negative except as described per history of present illness  EKGs/Labs/Other Studies Reviewed:      Recent Labs: 03/22/2020: ALT 66; Hemoglobin 16.3; Platelets 225 06/22/2020: BUN 16; Creatinine, Ser 1.16; Potassium 4.5; Sodium 140  Recent Lipid Panel    Component Value Date/Time   CHOL 215 (H) 06/22/2020 1629   TRIG 139 06/22/2020 1629   HDL 40 06/22/2020 1629   CHOLHDL 5.4 (H) 06/22/2020 1629   CHOLHDL 6.5 03/23/2020 0429   VLDL 55 (H) 03/23/2020 0429   LDLCALC 150 (H) 06/22/2020 1629    Physical Exam:    VS:  BP 136/60   Pulse 100   Ht 5\' 10"  (1.778 m)   Wt 209 lb (94.8 kg)   SpO2 96%   BMI 29.99 kg/m     Wt Readings from Last 3 Encounters:    08/24/20 209 lb (94.8 kg)  07/30/20 210 lb (95.3 kg)  07/30/20 210 lb (95.3 kg)     GEN:  Well nourished, well developed in no acute distress HEENT: Normal NECK: No JVD; No carotid bruits LYMPHATICS: No lymphadenopathy CARDIAC: RRR, no murmurs, no rubs, no gallops RESPIRATORY:  Clear to auscultation without rales, wheezing or rhonchi  ABDOMEN: Soft, non-tender, non-distended MUSCULOSKELETAL:  No edema; No deformity  SKIN: Warm and dry LOWER EXTREMITIES: no swelling NEUROLOGIC:  Alert and oriented x 3 PSYCHIATRIC:  Normal  affect   ASSESSMENT:    1. Essential hypertension   2. Dyspnea on exertion   3. Multiple joint pain   4. Late effect of cerebrovascular accident (CVA)    PLAN:    In order of problems listed above:  1. Essential hypertension blood pressure well controlled continue present management I will check his Chem-7 today. 2. Dyspnea on exertion multifactorial but getting better. 3. Dyslipidemia with intolerance to multiple statin.  His LDL still unacceptably high.  I will initiate Zetia and I will refer him to lipid clinic for consideration of PCSK9 agent. 4. Multiple joint pain.  Multifactorial followed by internal medicine team.   Medication Adjustments/Labs and Tests Ordered: Current medicines are reviewed at length with the patient today.  Concerns regarding medicines are outlined above.  No orders of the defined types were placed in this encounter.  Medication changes: No orders of the defined types were placed in this encounter.   Signed, Park Liter, MD, [facility] 08/24/2020 1:44 PM    [facility]

## 2020-08-24 NOTE — Patient Instructions (Signed)
Medication Instructions:  Your physician has recommended you make the following change in your medication:   START: Zetia 10 mg daily   *If you need a refill on your cardiac medications before your next appointment, please call your pharmacy*   Lab Work: Your physician recommends that you return for lab work today: bmp   If you have labs (blood work) drawn today and your tests are completely normal, you will receive your results only by: Marland Kitchen MyChart Message (if you have MyChart) OR . A paper copy in the mail If you have any lab test that is abnormal or we need to change your treatment, we will call you to review the results.   Testing/Procedures: None.    Follow-Up: At [facility], you and your health needs are our priority.  As part of our continuing mission to provide you with exceptional heart care, we have created designated Provider Care Teams.  These Care Teams include your primary Cardiologist (physician) and Advanced Practice Providers (APPs -  Physician Assistants and Nurse Practitioners) who all work together to provide you with the care you need, when you need it.  We recommend signing up for the patient portal called "MyChart".  Sign up information is provided on this After Visit Summary.  MyChart is used to connect with patients for Virtual Visits (Telemedicine).  Patients are able to view lab/test results, encounter notes, upcoming appointments, etc.  Non-urgent messages can be sent to your provider as well.   To learn more about what you can do with MyChart, go to [email or web address].    Your next appointment:   4 month(s)  The format for your next appointment:   In Person  Provider:   Jenne Campus, MD   Other Instructions  Ezetimibe Tablets What is this medicine? EZETIMIBE (ez ET i mibe) blocks the absorption of cholesterol from the stomach. It can help lower blood cholesterol for patients who are at risk of getting heart disease or a stroke. It is  only for patients whose cholesterol level is not controlled by diet. This medicine may be used for other purposes; ask your health care provider or pharmacist if you have questions. COMMON BRAND NAME(S): Zetia What should I tell my health care provider before I take this medicine? They need to know if you have any of these conditions:  liver disease  an unusual or allergic reaction to ezetimibe, medicines, foods, dyes, or preservatives  pregnant or trying to get pregnant  breast-feeding How should I use this medicine? Take this medicine by mouth with a glass of water. Follow the directions on the prescription label. This medicine can be taken with or without food. Take your doses at regular intervals. Do not take your medicine more often than directed. Talk to your pediatrician regarding the use of this medicine in children. Special care may be needed. Overdosage: If you think you have taken too much of this medicine contact a poison control center or emergency room at once. NOTE: This medicine is only for you. Do not share this medicine with others. What if I miss a dose? If you miss a dose, take it as soon as you can. If it is almost time for your next dose, take only that dose. Do not take double or extra doses. What may interact with this medicine? Do not take this medicine with any of the following medications:  fenofibrate  gemfibrozil This medicine may also interact with the following medications:  antacids  cyclosporine  herbal medicines  like red yeast rice  other medicines to lower cholesterol or triglycerides This list may not describe all possible interactions. Give your health care provider a list of all the medicines, herbs, non-prescription drugs, or dietary supplements you use. Also tell them if you smoke, drink alcohol, or use illegal drugs. Some items may interact with your medicine. What should I watch for while using this medicine? Visit your doctor or health  care professional for regular checks on your progress. You will need to have your cholesterol levels checked. If you are also taking some other cholesterol medicines, you will also need to have tests to make sure your liver is working properly. Tell your doctor or health care professional if you get any unexplained muscle pain, tenderness, or weakness, especially if you also have a fever and tiredness. You need to follow a low-cholesterol, low-fat diet while you are taking this medicine. This will decrease your risk of getting heart and blood vessel disease. Exercising and avoiding alcohol and smoking can also help. Ask your doctor or dietician for advice. What side effects may I notice from receiving this medicine? Side effects that you should report to your doctor or health care professional as soon as possible:  allergic reactions like skin rash, itching or hives, swelling of the face, lips, or tongue  dark yellow or brown urine  unusually weak or tired  yellowing of the skin or eyes Side effects that usually do not require medical attention (report to your doctor or health care professional if they continue or are bothersome):  diarrhea  dizziness  headache  stomach upset or pain This list may not describe all possible side effects. Call your doctor for medical advice about side effects. You may report side effects to FDA at 1-800-FDA-1088. Where should I keep my medicine? Keep out of the reach of children. Store at room temperature between 15 and 30 degrees C (59 and 86 degrees F). Protect from moisture. Keep container tightly closed. Throw away any unused medicine after the expiration date. NOTE: This sheet is a summary. It may not cover all possible information. If you have questions about this medicine, talk to your doctor, pharmacist, or health care provider.  2020 Elsevier/Gold Standard (2012-05-10 15:39:09)

## 2020-08-25 LAB — BASIC METABOLIC PANEL
BUN/Creatinine Ratio: 18 (ref 10–24)
BUN: 21 mg/dL (ref 8–27)
CO2: 27 mmol/L (ref 20–29)
Calcium: 9.8 mg/dL (ref 8.6–10.2)
Chloride: 99 mmol/L (ref 96–106)
Creatinine, Ser: 1.15 mg/dL (ref 0.76–1.27)
GFR calc Af Amer: 76 mL/min/{1.73_m2} (ref >59)
GFR calc non Af Amer: 65 mL/min/{1.73_m2} (ref >59)
Glucose: 92 mg/dL (ref 65–99)
Potassium: 4.7 mmol/L (ref 3.5–5.2)
Sodium: 139 mmol/L (ref 134–144)

## 2020-08-30 ENCOUNTER — Other Ambulatory Visit: Payer: Self-pay | Admitting: Family Medicine

## 2020-08-31 ENCOUNTER — Ambulatory Visit: Payer: Medicare HMO

## 2020-09-07 ENCOUNTER — Ambulatory Visit: Payer: Medicare HMO

## 2020-09-07 ENCOUNTER — Other Ambulatory Visit: Payer: Self-pay

## 2020-09-07 DIAGNOSIS — M25511 Pain in right shoulder: Secondary | ICD-10-CM | POA: Diagnosis not present

## 2020-09-07 DIAGNOSIS — I69351 Hemiplegia and hemiparesis following cerebral infarction affecting right dominant side: Secondary | ICD-10-CM | POA: Diagnosis not present

## 2020-09-07 DIAGNOSIS — R0781 Pleurodynia: Secondary | ICD-10-CM | POA: Diagnosis not present

## 2020-09-07 DIAGNOSIS — M25552 Pain in left hip: Secondary | ICD-10-CM | POA: Diagnosis not present

## 2020-09-07 DIAGNOSIS — R2681 Unsteadiness on feet: Secondary | ICD-10-CM | POA: Diagnosis not present

## 2020-09-07 NOTE — Therapy (Signed)
[facility] [address] [place], [place], [address] Phone: [phone]   Fax:  [phone]  Physical Therapy Treatment  Patient Details  Name: Thomas [name] MRN: [id number] Date of Birth: Nov 02, 1951 Referring Provider (PT): Frann Rider, FNP   Encounter Date: 09/07/2020   PT End of Session - 09/07/20 1647    Visit Number 15    Number of Visits 19    Date for PT Re-Evaluation 07/23/20    Authorization Type 04/27/20 Eval; 7/98/92 Re-certification (1x/week for 6 wks); recertification 12/06/39 (1x/week for 6 more weeks)    PT Start Time 1450    PT Stop Time 1530    PT Time Calculation (min) 40 min    Activity Tolerance Patient tolerated treatment well    Behavior During Therapy [facility] for tasks assessed/performed           Past Medical History:  Diagnosis Date  . Avulsed toenail, initial encounter 05/04/2018  . Chronic gout    multiple sites--- followed by pcp--- (04-14-2018  per pt last flare-up , march 2019)  . Dyspnea on exertion 02/10/2020  . ED (erectile dysfunction)   . Epidermoid cyst of neck 10/16/2017  . Essential hypertension 07/21/2017  . Gout 01/19/2014  . Gout of multiple sites 07/21/2017  . History of kidney stones   . History of squamous cell carcinoma excision   . Hyperlipidemia   . Hypertension   . Hypertension not at goal 01/19/2014  . Left ureteral calculus   . Lower abdominal pain 08/27/2015  . Multiple joint pain 07/16/2018  . Pain in joint, shoulder region 12/11/2015  . Preventative health care 11/14/2016  . Urgency of urination   . URI (upper respiratory infection) 11/08/2014  . Wears glasses     Past Surgical History:  Procedure Laterality Date  . COLONOSCOPY WITH PROPOFOL  2017  . CYSTOSCOPY/RETROGRADE/URETEROSCOPY/STONE EXTRACTION WITH BASKET Left 04/16/2018   Procedure: CYSTOSCOPY/RETROGRADE/URETEROSCOPY/STONE EXTRACTION WITH BASKET/ HOLMIUM LASER LITHOTRIPSY/ STENT PLACEMENT;  Surgeon:  Ardis Hughs, MD;  Location: [facility];  Service: Urology;  Laterality: Left;  . HOLMIUM LASER APPLICATION Left 7/40/8144   Procedure: HOLMIUM LASER APPLICATION;  Surgeon: Ardis Hughs, MD;  Location: [facility];  Service: Urology;  Laterality: Left;    There were no vitals filed for this visit.   Subjective Assessment - 09/07/20 1644    Subjective Pt reports he is feeling little better since last session. He is getting second shot in his left hip on Monday next week    Pertinent History past medical history of chronic arthritis, essential hypertension somewhat difficult to control on 4 medications at home, gout, hyperlipidemia,    How long can you sit comfortably? no issues    How long can you stand comfortably? 30 min    How long can you walk comfortably? 1 hour    Diagnostic tests MRI was performed that confirmed a left thalamic lacunar infarct.    Patient Stated Goals be able to walk 45 min a day, improve R shoulder and rib pain    Pain Onset More than a month ago                 Pain neuroscience education: - Educated pt on tissue sensitivity and how it increases due to prolonged pain in chronic pain. Pt educated on how emotional factors also affect tissue sensitivity. Tissue sensitivity is not constant and it can be altered with improving knowledge about pain, understanding physiologic limits  by modifying activity level to make sure his pain doesn't increase from baseline with activities, improving sleeping habits, and by gradually promoting movement that doesn't elicit painful response. - Pt educated on focusing on movements that doesn't increase his pain from baseline and gradually increasing intensity and frequency of those movements to improve positive reinforcement Pt educated on benefits of proper sleeping. - pt educated on tissue healing time frames and at this point, there is most likely no futher tissue healing that will  occur and his pain response is outside normal limits of tissue healing. This is also another reason for his altered tissue sensitivity  We worked on four factors that will affect tissue sensitivity: - Improving PNE education - Graded exposure - improving sleeping habits - positive reward system   Demonstrated meditation strategy where pt lays flat on table and works on finding his most painful spot. Comparing pain to size of fruit and with diaphragmatic breathing, he "eats" away at fruit with inhalation and takes bite of food or "pain" out of body as he exhalaes.                         PT Short Term Goals - 07/31/20 1901      PT SHORT TERM GOAL #1   Title Patient will score 24/24 on DGI to improve dynamic gait in 3 weeks    Baseline 22/24 (eval)    Time 2    Period Weeks    Status Achieved    Target Date 05/11/20      PT SHORT TERM GOAL #2   Title Patient will be able to stand on one leg for 10 sec to improve overall balance.    Baseline 6-8 sec bil (eval)    Time 2    Period Weeks    Status Achieved    Target Date 05/11/20      PT SHORT TERM GOAL #3   Title Pt will report <6/10 pain in R ribs with forward reaching with R arm to improve function at home and work    Baseline 8/10 (eval); 3/10 05/29/20    Time 2    Period Weeks    Status Achieved    Target Date 05/11/20             PT Long Term Goals - 07/31/20 1902      PT LONG TERM GOAL #1   Title Patient will be able to sleep through the night without waking up from pain to improve sleeping habits.    Baseline wakes up 2-3x/night due to pain    Time 6    Period Weeks    Status Revised    Target Date 09/11/20      PT LONG TERM GOAL #2   Title Patient will be able to walk 4 miles a week to improve walking endurance    Baseline <2 miles /week    Time 6    Period Weeks    Status Revised    Target Date 09/11/20      PT LONG TERM GOAL #3   Title Pt will report score of 80 or higher on FOTO  to improve therapeutic outcomes.    Baseline 59    Time 6    Period Weeks    Status Revised    Target Date 09/11/20                 Plan - 09/07/20 1645  Clinical Impression Statement Today's session was focused continued education for Pain neuroscience education (PNE) to improve patient's understanding of chronic pain. We talked about factor affecting tissue sensitivity and which factors lower tissue sensitivity with emphasis on "active" rather than "passive" interventions to improve overall function. Patient will require further training on expanding PNE knowledge to help improve pain and improve function.    Personal Factors and Comorbidities Comorbidity 3+    Comorbidities past medical history of chronic arthritis, essential hypertension somewhat difficult to control on 4 medications at home, gout, hyperlipidemia,    Examination-Activity Limitations Carry;Lift;Reach Overhead;Sleep;Locomotion Level    Examination-Participation Restrictions Cleaning;Community Activity;Driving;Laundry;Yard Work    Stability/Clinical Decision Making Evolving/Moderate complexity    Rehab Potential Good    PT Frequency 1x / week   2x/week for first 2 weeks and then 1x/week thereafter.   PT Duration 6 weeks    PT Treatment/Interventions ADLs/Self Care Home Management;Moist Heat;Gait training;Stair training;Functional mobility training;Therapeutic activities;Therapeutic exercise;Balance training;Neuromuscular re-education;Patient/family education;Manual techniques    PT Next Visit Plan lumbar mobilization, work on hip pain bil    PT Home Exercise Plan Work on walking program (pt educated to walk 5-10 min as long as walking doesn't irritate his gout) and then slowly build up his walking endurane, 1st rib self mobilization with scalene stretchAccess Code: [email or web address]: [email or web address]: 07/26/2021Prepared by: Gwenyth Bouillon PatelExercisesHip Flexor Stretch at [facility] of [facility] - 1 x daily - 7 x  weekly - 1 sets - 3 reps - 60 hold    Consulted and Agree with Plan of Care Patient           Patient will benefit from skilled therapeutic intervention in order to improve the following deficits and impairments:  Abnormal gait, Decreased activity tolerance, Decreased endurance, Difficulty walking, Pain  Visit Diagnosis: Unsteadiness on feet  Acute pain of right shoulder  Pain in left hip  Hemiplegia and hemiparesis following cerebral infarction affecting right dominant side (HCC)  Rib pain on right side     Problem List Patient Active Problem List   Diagnosis Date Noted  . 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 Acute ischemic stroke ([place]) 03/22/2020  . It band syndrome, left 03/19/2020  . Metatarsalgia of both feet 03/19/2020  . 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 Dyspnea on exertion 02/10/2020  . Multiple joint pain 07/16/2018  . Avulsed toenail, initial encounter 05/04/2018  . Epidermoid cyst of neck 10/16/2017  . Essential hypertension 07/21/2017  . Gout of multiple sites 07/21/2017  . Preventative health care 11/14/2016  . Pain in joint, shoulder region 12/11/2015  . Lower abdominal pain 08/27/2015  . URI (upper respiratory infection) 11/08/2014  . Gout 01/19/2014  . Hypertension not at goal 01/19/2014    Thomas [name] 09/07/2020, 4:49 PM  [facility] [address] [place], [place], [address] Phone: [phone]   Fax:  [phone]  Name: Thomas [name] MRN: [id number] Date of Birth: [birth year]

## 2020-09-10 ENCOUNTER — Ambulatory Visit: Payer: Medicare HMO | Admitting: Family Medicine

## 2020-09-10 ENCOUNTER — Other Ambulatory Visit: Payer: Self-pay

## 2020-09-10 VITALS — BP 142/78 | Ht 70.0 in | Wt 210.0 lb

## 2020-09-10 DIAGNOSIS — M25552 Pain in left hip: Secondary | ICD-10-CM

## 2020-09-10 MED ORDER — METHYLPREDNISOLONE ACETATE 80 MG/ML IJ SUSP
80.0000 mg | Freq: Once | INTRAMUSCULAR | Status: AC
  Administered 2020-09-10: 80 mg via INTRA_ARTICULAR

## 2020-09-10 NOTE — Patient Instructions (Signed)
Give your obliques a period of rest from stretches and strengthening. Continue stretches and strengthening exercises for your hips. You were given a repeat injection for this a little more proximal than last time. Hot water bottle as you have been 15 minutes at a time. Let me know how you're doing over the next couple weeks. If you're still not noticing improvement in this pain on the outside of your hip we will go ahead with imaging of your low back to see if this is referred pain masquerading as IT band and gluteal issues.

## 2020-09-11 ENCOUNTER — Encounter: Payer: Self-pay | Admitting: Family Medicine

## 2020-09-11 NOTE — Progress Notes (Signed)
PCP: Michael Boston, MD  Subjective:   HPI: Patient is a 69 y.o. male here for left hip pain.  8/16: Patient reports over the last several weeks his lateral left hip pain radiating down the leg to about the knee has worsened. He is not sleeping very well as a result of the pain. 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 He is taking Tylenol and using a lidocaine patch with both helping some. No numbness or tingling. No new low back pain. No bowel or bladder dysfunction. He also brought up that he does not generally deal with chronic pain related to his gout, underlying arthropathy.  He had previously been on tramadol though would like to explore other options.  8/20: Greater trochanteric injection given.  9/13: Patient reports shot helped with pain on lateral hip over bony prominence but still having pain lateral left hip. Doing physical therapy. Has completely rested otherwise. Pain worse with hiking. Cannot sleep on left side still. Nortriptyline made him nauseous so he stopped this.  10/25: Patient returns today to repeat left hip injection. He tried gabapentin, baclofen, and nortriptyline - unfortunately they've all made him feel groggy in the morning and unable to work on these. Notes advil, hot water bottle to lateral left hip has helped. Notes some pain lateral right hip too but not as bad as left hip. Been doing some therapy for hip and home exercises.  Also doing this for right side pain that wraps from right low back around to anterior abdomen.   Past Medical History:  Diagnosis Date  . Avulsed toenail, initial encounter 05/04/2018  . Chronic gout    multiple sites--- followed by pcp--- (04-14-2018  per pt last flare-up , march 2019)  . Dyspnea on exertion 02/10/2020  . ED (erectile dysfunction)   . Epidermoid cyst of neck 10/16/2017  . Essential hypertension 07/21/2017  . Gout  01/19/2014  . Gout of multiple sites 07/21/2017  . History of kidney stones   . History of squamous cell carcinoma excision   . Hyperlipidemia   . Hypertension   . Hypertension not at goal 01/19/2014  . Left ureteral calculus   . Lower abdominal pain 08/27/2015  . Multiple joint pain 07/16/2018  . Pain in joint, shoulder region 12/11/2015  . Preventative health care 11/14/2016  . Urgency of urination   . URI (upper respiratory infection) 11/08/2014  . Wears glasses     Current Outpatient Medications on File Prior to Visit  Medication Sig Dispense Refill  . allopurinol (ZYLOPRIM) 300 MG tablet Take 300 mg by mouth daily.    Marland Kitchen amLODipine (NORVASC) 5 MG tablet Take 1 tablet (5 mg total) by mouth daily. 90 tablet 3  . b complex vitamins tablet Take 1 tablet by mouth daily.    . Calcium-Magnesium-Vitamin D (CALCIUM MAGNESIUM PO) Take 1 tablet by mouth daily.    . cloNIDine (CATAPRES) 0.1 MG tablet Take 0.1 mg by mouth 2 (two) times daily.    Marland Kitchen ezetimibe (ZETIA) 10 MG tablet Take 1 tablet (10 mg total) by mouth daily. 90 tablet 1  . hydrochlorothiazide (HYDRODIURIL) 25 MG tablet Take 25 mg by mouth daily.    . minoxidil (LONITEN) 2.5 MG tablet TAKE 2 TABLETS BY MOUTH 2 TIMES DAILY. 120 tablet 3  . traMADol (ULTRAM) 50 MG tablet Take 50 mg by mouth 2 (two) times daily.    Marland Kitchen  valsartan (DIOVAN) 320 MG tablet TAKE 1 TABLET BY MOUTH EVERY DAY 90 tablet 0   No current facility-administered medications on file prior to visit.    Past Surgical History:  Procedure Laterality Date  . COLONOSCOPY WITH PROPOFOL  2017  . CYSTOSCOPY/RETROGRADE/URETEROSCOPY/STONE EXTRACTION WITH BASKET Left 04/16/2018   Procedure: CYSTOSCOPY/RETROGRADE/URETEROSCOPY/STONE EXTRACTION WITH BASKET/ HOLMIUM LASER LITHOTRIPSY/ STENT PLACEMENT;  Surgeon: Ardis Hughs, MD;  Location: [facility];  Service: Urology;  Laterality: Left;  . HOLMIUM LASER APPLICATION Left 1/60/7371   Procedure: HOLMIUM LASER  APPLICATION;  Surgeon: Ardis Hughs, MD;  Location: [facility];  Service: Urology;  Laterality: Left;    No Active Allergies  Social History   Socioeconomic History  . Marital status: Married    Spouse name: Not on file  . Number of children: Not on file  . Years of education: Not on file  . Highest education level: Not on file  Occupational History  . Occupation: Pharmacist, hospital  Tobacco Use  . Smoking status: Former Smoker    Packs/day: 0.30    Years: 20.00    Pack years: 6.00    Types: Cigarettes    Quit date: 10/17/1994    Years since quitting: 25.9  . Smokeless tobacco: Never Used  Vaping Use  . Vaping Use: Never assessed  Substance and Sexual Activity  . Alcohol use: Yes    Alcohol/week: 2.0 standard drinks    Types: 2 Glasses of wine per week    Comment: rare  . Drug use: No  . Sexual activity: Yes  Other Topics Concern  . Not on file  Social History Narrative   Exercise 1 hour a day ---3-4 days a week   Social Determinants of Health   Financial Resource Strain:   . Difficulty of Paying Living Expenses: Not on file  Food Insecurity:   . Worried About Charity fundraiser in the Last Year: Not on file  . Ran Out of Food in the Last Year: Not on file  Transportation Needs:   . Lack of Transportation (Medical): Not on file  . Lack of Transportation (Non-Medical): Not on file  Physical Activity:   . Days of Exercise per Week: Not on file  . Minutes of Exercise per Session: Not on file  Stress:   . Feeling of Stress : Not on file  Social Connections:   . Frequency of Communication with Friends and Family: Not on file  . Frequency of Social Gatherings with Friends and Family: Not on file  . Attends Religious Services: Not on file  . Active Member of Clubs or Organizations: Not on file  . Attends Archivist Meetings: Not on file  . Marital Status: Not on file  Intimate Partner Violence:   . Fear of Current or Ex-Partner: Not on file   . Emotionally Abused: Not on file  . Physically Abused: Not on file  . Sexually Abused: Not on file    Family History  Problem Relation Age of Onset  . Heart disease Mother 25  . Alcohol abuse Mother   . Depression Mother   . Cancer Sister        hodgkins, breast  . Hypertension Father   . Colon cancer Neg Hx   . Pancreatic cancer Neg Hx   . Rectal cancer Neg Hx   . Stomach cancer Neg Hx     BP (!) 142/78   Ht 5\' 10"  (1.778 m)   Wt 210 lb (  95.3 kg)   BMI 30.13 kg/m   Review of Systems: See HPI above.     Objective:  Physical Exam:  Gen: NAD, comfortable in exam room  Left hip: No deformity. FROM with 5/5 strength except 4/5 hip abduction, painful. TTP over gluteus medius, minimus, proximal IT band.  Minimal greater trochanter tenderness. Negative logroll, faber, fadir, SLR. NVI distally.  Right abdomen:  Mild tenderness to palpation over obliques.  Pain reproduced with trunk rotation.  No palpable defect in abdominal wall or stepoff of ribs.   Assessment & Plan:  1. Left hip pain: more proximal within proximal IT band, gluteal musculature than greater trochanteric pain syndrome.  Encouraged continued strengthening and stretching - still has work to do with these.  Injection given into this area proximally.  Heat, topical medications, tylenol if needed.  Discussed if still not improving would consider lumbar spine imaging as next step.  After informed written consent timeout was performed, patient was lying on right side on exam table.  Area overlying left proximal IT band prepped with alcohol swab then injected with 6:1 bupivicaine: depomedrol.  Patient tolerated procedure well without immediate complications.

## 2020-09-14 ENCOUNTER — Ambulatory Visit: Payer: Medicare HMO

## 2020-09-20 ENCOUNTER — Ambulatory Visit: Payer: Medicare HMO

## 2020-10-08 ENCOUNTER — Other Ambulatory Visit: Payer: Self-pay | Admitting: Family Medicine

## 2020-10-16 ENCOUNTER — Other Ambulatory Visit: Payer: Self-pay | Admitting: Cardiology

## 2020-10-16 NOTE — Telephone Encounter (Signed)
Rx refill sent to pharmacy. 

## 2020-10-31 ENCOUNTER — Ambulatory Visit (INDEPENDENT_AMBULATORY_CARE_PROVIDER_SITE_OTHER): Payer: Medicare HMO | Admitting: Family Medicine

## 2020-10-31 ENCOUNTER — Encounter: Payer: Self-pay | Admitting: Family Medicine

## 2020-10-31 ENCOUNTER — Other Ambulatory Visit: Payer: Self-pay

## 2020-10-31 VITALS — BP 152/82 | Ht 70.0 in | Wt 210.0 lb

## 2020-10-31 DIAGNOSIS — M549 Dorsalgia, unspecified: Secondary | ICD-10-CM | POA: Diagnosis not present

## 2020-10-31 MED ORDER — MELOXICAM 15 MG PO TABS: 15.0000 mg | ORAL_TABLET | Freq: Every day | ORAL | 2 refills | Status: DC

## 2020-10-31 NOTE — Patient Instructions (Signed)
Your back pain is due to lat and serratus posterior strains/spasms. Stop the advil as it's out of your system when this wakes you up. Try meloxicam 15mg  at 9pm (or dinner) instead with food. Continue with the theragun, heat. Start the stretches as demonstrated and do them before bed also. Follow up with me in about 1 month.

## 2020-10-31 NOTE — Progress Notes (Signed)
PCP: Michael Boston, MD  Subjective:   HPI: Patient is a 69 y.o. male here for a hip pain follow-up.  He is also having back pain.  Initially presented on 8/16 for the hip pain.  Received a greater trochanteric injection on 8/20.  Left hip was reinjected on 10/25.  Patient also reported pain in his anterior abdomen while doing physical therapy exercises.  Is determined he had an anterior oblique injury and patient was instructed to rest.  Since his previous visit his pelvic pain and side pain has greatly improved.  Patient reports that over the last 2 weeks with the rest and decreased stretching he has noticed increasing pain in the right side of his back about halfway down his back.  It will wake him up at night and wakes him up 5/7 nights per week.  He has been taking Neurontin over the past week and a half and reports that it helps him sleep although he is still waking up and he feels groggy in the morning.  He takes ibuprofen 600 mg once in mid morning and once at 9 PM.  He also uses a Theragun and a heated bottle with some relief.  No weakness, no red flag symptoms reported.  Past Medical History:  Diagnosis Date  . Avulsed toenail, initial encounter 05/04/2018  . Chronic gout    multiple sites--- followed by pcp--- (04-14-2018  per pt last flare-up , march 2019)  . Dyspnea on exertion 02/10/2020  . ED (erectile dysfunction)   . Epidermoid cyst of neck 10/16/2017  . Essential hypertension 07/21/2017  . Gout 01/19/2014  . Gout of multiple sites 07/21/2017  . History of kidney stones   . History of squamous cell carcinoma excision   . Hyperlipidemia   . Hypertension   . Hypertension not at goal 01/19/2014  . Left ureteral calculus   . Lower abdominal pain 08/27/2015  . Multiple joint pain 07/16/2018  . Pain in joint, shoulder region 12/11/2015  . Preventative health care 11/14/2016  . Urgency of urination   . URI (upper respiratory infection) 11/08/2014  . Wears glasses     Current  Outpatient Medications on File Prior to Visit  Medication Sig Dispense Refill  . allopurinol (ZYLOPRIM) 300 MG tablet Take 300 mg by mouth daily.    Marland Kitchen amLODipine (NORVASC) 5 MG tablet Take 1 tablet (5 mg total) by mouth daily. 90 tablet 3  . b complex vitamins tablet Take 1 tablet by mouth daily.    . Calcium-Magnesium-Vitamin D (CALCIUM MAGNESIUM PO) Take 1 tablet by mouth daily.    . cloNIDine (CATAPRES) 0.1 MG tablet Take 0.1 mg by mouth 2 (two) times daily.    Marland Kitchen ezetimibe (ZETIA) 10 MG tablet Take 1 tablet (10 mg total) by mouth daily. 90 tablet 1  . hydrochlorothiazide (HYDRODIURIL) 25 MG tablet TAKE 1 TABLET BY MOUTH EVERY DAY 90 tablet 0  . minoxidil (LONITEN) 2.5 MG tablet TAKE 2 TABLETS BY MOUTH 2 TIMES DAILY. 120 tablet 3  . traMADol (ULTRAM) 50 MG tablet Take 50 mg by mouth 2 (two) times daily.    . valsartan (DIOVAN) 320 MG tablet TAKE 1 TABLET BY MOUTH EVERY DAY 90 tablet 0   No current facility-administered medications on file prior to visit.    Past Surgical History:  Procedure Laterality Date  . COLONOSCOPY WITH PROPOFOL  2017  . CYSTOSCOPY/RETROGRADE/URETEROSCOPY/STONE EXTRACTION WITH BASKET Left 04/16/2018   Procedure: CYSTOSCOPY/RETROGRADE/URETEROSCOPY/STONE EXTRACTION WITH BASKET/ HOLMIUM LASER LITHOTRIPSY/ STENT PLACEMENT;  Surgeon: Ardis Hughs, MD;  Location: [facility];  Service: Urology;  Laterality: Left;  . HOLMIUM LASER APPLICATION Left 11/22/2692   Procedure: HOLMIUM LASER APPLICATION;  Surgeon: Ardis Hughs, MD;  Location: [facility];  Service: Urology;  Laterality: Left;    No Active Allergies  Social History   Socioeconomic History  . Marital status: Married    Spouse name: Not on file  . Number of children: Not on file  . Years of education: Not on file  . Highest education level: Not on file  Occupational History  . Occupation: Pharmacist, hospital  Tobacco Use  . Smoking status: Former Smoker    Packs/day: 0.30     Years: 20.00    Pack years: 6.00    Types: Cigarettes    Quit date: 10/17/1994    Years since quitting: 26.0  . Smokeless tobacco: Never Used  Vaping Use  . Vaping Use: Not on file  Substance and Sexual Activity  . Alcohol use: Yes    Alcohol/week: 2.0 standard drinks    Types: 2 Glasses of wine per week    Comment: rare  . Drug use: No  . Sexual activity: Yes  Other Topics Concern  . Not on file  Social History Narrative   Exercise 1 hour a day ---3-4 days a week   Social Determinants of Health   Financial Resource Strain: Not on file  Food Insecurity: Not on file  Transportation Needs: Not on file  Physical Activity: Not on file  Stress: Not on file  Social Connections: Not on file  Intimate Partner Violence: Not on file    Family History  Problem Relation Age of Onset  . Heart disease Mother 78  . Alcohol abuse Mother   . Depression Mother   . Cancer Sister        hodgkins, breast  . Hypertension Father   . Colon cancer Neg Hx   . Pancreatic cancer Neg Hx   . Rectal cancer Neg Hx   . Stomach cancer Neg Hx     BP (!) 152/82   Ht 5\' 10"  (1.778 m)   Wt 210 lb (95.3 kg)   BMI 30.13 kg/m   [facility] Adult Exercise 07/30/2020 09/10/2020  Frequency of aerobic exercise (# of days/week) 5 5  Average time in minutes 60 60  Frequency of strengthening activities (# of days/week) 0 0    No flowsheet data found.  Review of Systems: See HPI above.     Objective:  Physical Exam:  Gen: NAD, comfortable in exam room Back: No gross deformity, scoliosis. TTP 4-8 cm below inferomedial aspect of right scapula.  No midline or bony TTP. FROM. Strength LEs 5/5 all muscle groups.   2+ MSRs in patellar and achilles tendons, equal bilaterally. Negative SLRs. Sensation intact to light touch bilaterally.  Hip, left: No obvious rash, erythema, ecchymosis, or edema. ROM full in all directions; Strength 5/5 in IR/ER/Flex/Ext/Abd/Add. Pelvic alignment  unremarkable to inspection and palpation.  Notes SI joint tenderness.  Patient reports occasional pain that radiates to his groin which has improved over the last few weeks    Assessment & Plan:  1.  Back pain: Muscular strain most likely latissimus dorsi versus serratus posterior.  Encouraged him to start stretching and strengthening exercises again.  Recommended continued heat, Thera gun, Voltaren gel as needed.  Should discontinue the ibuprofen.  Prescription sent for meloxicam to patient's pharmacy.  Follow-up in 1 month.

## 2020-11-02 ENCOUNTER — Other Ambulatory Visit: Payer: Self-pay | Admitting: Cardiology

## 2020-11-17 HISTORY — PX: HERNIA REPAIR: SHX51

## 2020-11-22 ENCOUNTER — Ambulatory Visit: Payer: Medicare HMO

## 2020-11-22 ENCOUNTER — Telehealth: Payer: Self-pay | Admitting: Pharmacist

## 2020-11-22 NOTE — Telephone Encounter (Signed)
Patient cancelled appointment with lipid clinic in November/2021 and January/2022.

## 2020-11-22 NOTE — Progress Notes (Deleted)
Patient ID: Thomas [name]                 DOB: 05-Dec-1950                    MRN: [id number]     HPI: Thomas [name] is a 69 y.o. male patient referred to lipid clinic by . PMH is significant for CVA, hypertension, and dyslipidemia.   Current Medications:  Pravastatin 20mg  daily Ezetimibe 10mg  daily  Intolerances:  Atorvastatin - jaw and joint pain  LDL goal:   Diet:   Exercise:   Family History:   Social History:   Labs:  Past Medical History:  Diagnosis Date  . Avulsed toenail, initial encounter 05/04/2018  . Chronic gout    multiple sites--- followed by pcp--- (04-14-2018  per pt last flare-up , march 2019)  . Dyspnea on exertion 02/10/2020  . ED (erectile dysfunction)   . Epidermoid cyst of neck 10/16/2017  . Essential hypertension 07/21/2017  . Gout 01/19/2014  . Gout of multiple sites 07/21/2017  . History of kidney stones   . History of squamous cell carcinoma excision   . Hyperlipidemia   . Hypertension   . Hypertension not at goal 01/19/2014  . Left ureteral calculus   . Lower abdominal pain 08/27/2015  . Multiple joint pain 07/16/2018  . Pain in joint, shoulder region 12/11/2015  . Preventative health care 11/14/2016  . Urgency of urination   . URI (upper respiratory infection) 11/08/2014  . Wears glasses     Current Outpatient Medications on File Prior to Visit  Medication Sig Dispense Refill  . allopurinol (ZYLOPRIM) 300 MG tablet Take 300 mg by mouth daily.    Marland Kitchen amLODipine (NORVASC) 5 MG tablet Take 1 tablet (5 mg total) by mouth daily. 90 tablet 3  . b complex vitamins tablet Take 1 tablet by mouth daily.    . Calcium-Magnesium-Vitamin D (CALCIUM MAGNESIUM PO) Take 1 tablet by mouth daily.    . cloNIDine (CATAPRES) 0.1 MG tablet Take 0.1 mg by mouth 2 (two) times daily.    . colchicine 0.6 MG tablet Take 0.6 mg by mouth daily.    Marland Kitchen ezetimibe (ZETIA) 10 MG tablet Take 1 tablet (10 mg total) by mouth daily. 90 tablet 1  . hydrochlorothiazide (HYDRODIURIL)  25 MG tablet TAKE 1 TABLET BY MOUTH EVERY DAY 90 tablet 0  . meloxicam (MOBIC) 15 MG tablet Take 1 tablet (15 mg total) by mouth daily. 30 tablet 2  . minoxidil (LONITEN) 2.5 MG tablet TAKE 2 TABLETS BY MOUTH TWICE A DAY 360 tablet 2  . pravastatin (PRAVACHOL) 20 MG tablet Take 20 mg by mouth daily.    . valsartan (DIOVAN) 320 MG tablet TAKE 1 TABLET BY MOUTH EVERY DAY 90 tablet 0   No current facility-administered medications on file prior to visit.    No Active Allergies  No problem-specific Assessment & Plan notes found for this encounter.    Thomas [name] PharmD, BCPS, [place] [address] [name] [id number] 11/22/2020 1:21 PM

## 2020-11-23 ENCOUNTER — Encounter: Payer: Self-pay | Admitting: General Practice

## 2020-11-23 ENCOUNTER — Encounter: Payer: Self-pay | Admitting: Adult Health

## 2020-11-27 ENCOUNTER — Other Ambulatory Visit: Payer: Self-pay | Admitting: Family Medicine

## 2020-11-29 ENCOUNTER — Telehealth (INDEPENDENT_AMBULATORY_CARE_PROVIDER_SITE_OTHER): Payer: Medicare HMO | Admitting: Adult Health

## 2020-11-29 ENCOUNTER — Encounter: Payer: Self-pay | Admitting: Adult Health

## 2020-11-29 DIAGNOSIS — M79604 Pain in right leg: Secondary | ICD-10-CM

## 2020-11-29 DIAGNOSIS — I639 Cerebral infarction, unspecified: Secondary | ICD-10-CM | POA: Diagnosis not present

## 2020-11-29 DIAGNOSIS — I6381 Other cerebral infarction due to occlusion or stenosis of small artery: Secondary | ICD-10-CM

## 2020-11-29 MED ORDER — VALSARTAN 320 MG PO TABS: 320.0000 mg | ORAL_TABLET | Freq: Every day | ORAL | 0 refills | Status: DC

## 2020-11-29 NOTE — Progress Notes (Signed)
[facility] [address] [place]. [place] [address] [phone]       STROKE FOLLOW UP NOTE  [name] Date of Birth:  31-Jan-1951 Medical Record Number:  [id number]   Reason for Referral: stroke follow up   Virtual Visit via Video Note  I connected with Thomas [name] on 11/29/20 at  7:45 AM EST by a video enabled telemedicine application located in office and verified that I am speaking with the correct person using two identifiers who was located at their own home.   Cyril Mourning Dinkins, RN scheduled visit who discussed the limitations of evaluation and management by telemedicine and the availability of in person appointments. The patient expressed understanding and agreed to proceed.    SUBJECTIVE:   HPI:   Today, 11/29/2020, Thomas [name] is being seen via MyChart virtual visit per patient request for 105-month stroke follow-up.  Stable from stroke standpoint without new or worsening stroke/TIA.  His largest concern today is in regards to ongoing right leg pain which has been present since his stroke.  Pain typically starts right lower back and can radiate into the hip, thigh, knee and to mid calf.  Describes it as a achy type sensation with tightness and spasms.  Symptoms typically worsened with pressure such as laying on right side which unfortunately has been interfering with his sleep.  Symptoms not worsened with ambulation.  He was previously participating with neuro rehab PT with last visit 10/22 and did not return as he felt this worsened his symptoms.  He also feels as though RLE pain contributing to imbalance.  He has routinely followed with sports medicine Dr. Barbaraann Barthel for chronic left hip pain receiving injections with benefit.  He is also restarted on low-dose gabapentin 300 mg daily with some benefit.  Intolerance of higher dose gabapentin, baclofen and nortriptyline due to grogginess feeling in the morning.  He is requesting/questioning further  evaluation by neurologist to rule out other causes contributing to his pain or if this is more post stroke pain and something that he may have to "learn to live with".   He has remained on aspirin 81 mg daily without bleeding or bruising.  Cardiology initiated Zetia 10 mg daily for HLD management and history of statin intolerance with plans on follow-up next month. Blood pressure monitored at home which has been stable.    History provided for reference purposes only Update 07/30/2020 JM: Thomas [name] returns for stroke follow-up unaccompanied Reports residual deficit right-sided sensory impairment and imbalance Previously working with PT with improvement but currently placed on due to left hip pain -prior to stroke, L hip/IT band issues and this has worsened over the past 1.5 weeks - he believes due to increased exercise and exertion with PT Previously reported right shoulder pain greatly improved since prior visit with working with PT Feels tightness/pull or spasming sensation around right rib cage and into lower back and right thigh and knee especially towards the end of the day Plans on restarting PT tomorrow at neuro rehab  Orthopedics initiated nortriptyline for pain symptoms but limited benefit with pain and possible GI side effects - he self discontinued.  Continues on tramadol PRN managed by orthopedics with benefit Denies new or worsening stroke/TIA symptoms Remains on aspirin 81 mg daily without bleeding or bruising Recent lipid panel showed uncontrolled HLD with LDL 150 and cardiology recommended initiating atorvastatin 10 mg daily but patient declined due to prior myalgias therefore initiated pravastatin but unable to tolerate due  to jaw pain - improvement of symptoms after discontinuing. He does have follow up with cards on 08/22/2020.  Blood pressure today 169/75 - monitors at home and has been ranging 122-160/73-84 (per machine average recording).  Continues to follow closely with  cardiology Evaluated by Dr. Rexene Alberts in 05/2020 due to concern of underlying sleep apnea.  Initial scheduled sleep apnea testing canceled recently due to increased left hip pain interfering with sleep. He has not rescheduled test at this time as he is currently waiting until improvement of pain.  No further concerns at this time  Initial visit 04/24/2020 JM: Thomas [name] is being seen for hospital follow-up. He has been doing well since discharge. He has continued to experience numbness and increased sensitivity on right side which typically worsens towards the end of the day but does report overall improvement.  He also has experienced increased fatigue and decreased activity tolerance.  Recently seen by sports medicine due to right-sided pains with underlying chronic pain history.  He plans on starting physical therapy in the near future. Completed 3 weeks DAPT and continues on aspirin alone without bleeding or bruising.  Self discontinued atorvastatin as he had difficulty tolerating due to myalgias. Since discontinuing, myalgias subsided.  No new statin has been started. He has made drastic dietary changes as he wishes to manage cholesterol by diet due to potential statin side effects. Blood pressure today 172/82.  Monitors at home and has been 140s at home. Currently being followed by cardiology with longstanding history of blood pressure management difficulty.  He endorses frustrations during today's visit as they have not been able to find a cause for continued uncontrolled blood pressures.  Previously discussed possible sleep apnea with cardiology but sleep study declined by insurance.  He does report snoring, daytime fatigue, witnessed apnea and occasional insomnia.  No further concerns at this time.  Stroke admission 03/22/2020 Thomas [name] is a 70 y.o. male with history of chronic arthritis, essential hypertension somewhat difficult to control on 4 medications at home, gout, hyperlipidemia present on  03/22/2020 to [facility] ED with R sided numbness.  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  CTA negative LVO with marked right VA origin stenosis.  Initiated DAPT for 3 weeks and aspirin alone.  History of HTN stable during admission and recommended long-term BP goal normotensive range.  LDL 126 and initiated atorvastatin 40 mg daily.  Other stroke risk factors include advanced age, former tobacco use, EtOH use and obesity.  No prior history of stroke.  Other active problems include cervical spinal stenosis and left thyroid nodule as evidenced on MR CS and recommended PCP follow-up outpatient.  Evaluated by therapy who recommended outpatient PT and discharged home in stable condition.  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 Mild small vessel disease.   MR CS mild to moderate canal stenosis C3-4, mild C4-5, 5-6, 6-7. severe R C6-7 and moderate B C3-4, 4-5 foraminal stenosis. L thyroid nodule ([place] recommended)  CTA head & neck small L thalamic lacune. No LVO. Market R VA origin stenosis.  2D Echo EF 60-56%. No source of embolus   LDL 126  HgbA1c 6.3  Lovenox 40 mg sq daily for VTE prophylaxis  No antithrombotic prior to admission, now on aspirin 81 mg daily and clopidogrel 75 mg daily. Continue DAPT x 3 weeks then aspirin alone.  Therapy recommendations:  OP PT  Disposition:  Return home       ROS:   14 system review of systems performed and negative with exception of those listed in HPI  PMH:  Past Medical History:  Diagnosis Date  . Avulsed toenail, initial encounter 05/04/2018  . Chronic gout    multiple sites--- followed by pcp--- (04-14-2018  per pt last flare-up , march 2019)  . Dyspnea on exertion 02/10/2020  . ED (erectile dysfunction)   . Epidermoid cyst of neck 10/16/2017  . Essential hypertension 07/21/2017  . Gout 01/19/2014   . Gout of multiple sites 07/21/2017  . History of kidney stones   . History of squamous cell carcinoma excision   . Hyperlipidemia   . Hypertension   . Hypertension not at goal 01/19/2014  . Left ureteral calculus   . Lower abdominal pain 08/27/2015  . Multiple joint pain 07/16/2018  . Pain in joint, shoulder region 12/11/2015  . Preventative health care 11/14/2016  . Urgency of urination   . URI (upper respiratory infection) 11/08/2014  . Wears glasses     PSH:  Past Surgical History:  Procedure Laterality Date  . COLONOSCOPY WITH PROPOFOL  2017  . CYSTOSCOPY/RETROGRADE/URETEROSCOPY/STONE EXTRACTION WITH BASKET Left 04/16/2018   Procedure: CYSTOSCOPY/RETROGRADE/URETEROSCOPY/STONE EXTRACTION WITH BASKET/ HOLMIUM LASER LITHOTRIPSY/ STENT PLACEMENT;  Surgeon: Ardis Hughs, MD;  Location[facility];  Service: Urology;  Laterality: Left;  . HOLMIUM LASER APPLICATION Left 5/40/0867   Procedure: HOLMIUM LASER APPLICATION;  Surgeon: Ardis Hughs, MD;  Location: [facility];  Service: Urology;  Laterality: Left;    Social History:  Social History   Socioeconomic History  . Marital status: Married    Spouse name: Not on file  . Number of children: Not on file  . Years of education: Not on file  . Highest education level: Not on file  Occupational History  . Occupation: Pharmacist, hospital  Tobacco Use  . Smoking status: Former Smoker    Packs/day: 0.30    Years: 20.00    Pack years: 6.00    Types: Cigarettes    Quit date: 10/17/1994    Years since quitting: 26.1  . Smokeless tobacco: Never Used  Vaping Use  . Vaping Use: Not on file  Substance and Sexual Activity  . Alcohol use: Yes    Alcohol/week: 2.0 standard drinks    Types: 2 Glasses of wine per week    Comment: rare  . Drug use: No  . Sexual activity: Yes  Other Topics Concern  . Not on file  Social History Narrative   Exercise 1 hour a day ---3-4 days a week   Social Determinants  of Health   Financial Resource Strain: Not on file  Food Insecurity: Not on file  Transportation Needs: Not on file  Physical Activity: Not on file  Stress: Not on file  Social Connections: Not on file  Intimate Partner Violence: Not on file    Family History:  Family History  Problem Relation Age of Onset  . Heart disease Mother 23  . Alcohol abuse Mother   . Depression Mother   . Cancer Sister        hodgkins, breast  . Hypertension Father   . Colon cancer Neg Hx   . Pancreatic cancer Neg Hx   . Rectal cancer Neg Hx   . Stomach cancer Neg Hx     Medications:   Current Outpatient Medications on File Prior to Visit  Medication Sig Dispense Refill  . allopurinol (ZYLOPRIM) 300 MG tablet Take 300 mg by mouth daily.    Marland Kitchen amLODipine (NORVASC) 5 MG tablet Take 1 tablet (5 mg total) by mouth daily. 90 tablet 3  . b complex vitamins tablet Take 1 tablet by mouth daily.    . Calcium-Magnesium-Vitamin D (CALCIUM MAGNESIUM PO) Take 1 tablet by mouth daily.    . cloNIDine (CATAPRES) 0.1 MG tablet Take 0.1 mg by mouth 2 (two) times daily.    . colchicine 0.6 MG tablet Take 0.6 mg by mouth daily.    Marland Kitchen ezetimibe (ZETIA) 10 MG tablet Take 1 tablet (10 mg total) by mouth daily. 90 tablet 1  . hydrochlorothiazide (HYDRODIURIL) 25 MG tablet TAKE 1 TABLET BY MOUTH EVERY DAY 90 tablet 0  . meloxicam (MOBIC) 15 MG tablet Take 1 tablet (15 mg total) by mouth daily. 30 tablet 2  . minoxidil (LONITEN) 2.5 MG tablet TAKE 2 TABLETS BY MOUTH TWICE A DAY 360 tablet 2  . pravastatin (PRAVACHOL) 20 MG tablet Take 20 mg by mouth daily.    . valsartan (DIOVAN) 320 MG tablet TAKE 1 TABLET BY MOUTH EVERY DAY 90 tablet 0   No current facility-administered medications on file prior to visit.    Allergies:   No Active Allergies    OBJECTIVE: General: well developed, well nourished, pleasant middle-age Caucasian male, seated, in no evident distress Head: head normocephalic and atraumatic.          ASSESSMENT: YING ROCKS is a 70 y.o. 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 Vascular risk factors include HTN, HLD, advanced age, former tobacco use and EtOH use.      PLAN:  1. Right leg symptoms a. Long discussion regarding further evaluation or further intervention options b. Discussed symptoms possibly post stroke pain syndrome but also unable to fully rule out lumbar etiology c. Discussed possibly obtaining MR lumbar or EMG/NCV for further evaluation or refer to PT for dry needling. He is interested in dry needling but would like to hold off in setting of the pandemic d. He requests further evaluation by neurologist or neurosurgery. I did offer to discuss his case with Dr. Leonie Man but he wishes to be formally evaluated by neurologist. It is recommended to first be evaluated by neurologist as he is already established in this office and if indicated, referral will be placed to neurosurgery in the future. Recommend in person evaluation by Dr. Leonie Man.  2. Left thalamic stroke:  a. Continue aspirin 81 mg daily daily and Zetia 10 mg daily for secondary stroke prevention.   b. Discussed importance of close PCP follow-up for aggressive stroke risk factor management. 3. HTN:  a. BP goal<130/90.  Controlled on amlodipine 5 mg daily, clonidine 0.1 mg twice daily, hydrochlorothiazide 25 mg daily and valsartan 320 mg daily per PCP 4. HLD:  a. LDL goal <70.  Intolerant to atorvastatin and pravastatin.  Cardiology initiated Zetia 10 mg daily with plans on follow-up visit next month 5. High risk of sleep apnea:  a. Evaluated by Dr. Rexene Alberts in 05/2020 -sleep study remains on hold due to continued pain symptoms interfering with his sleep. He does plan on pursuing once his pain is under better control    Follow-up with Dr. Leonie Man at next available   CC:  [place] provider: Dr.  Lavona Mound, [name], MD     I spent 30  minutes of non-face-to-face time with patient via MyChart virtual visit.  This included previsit chart review, lab review, study review, order entry, electronic health record documentation, patient education/discussion regarding prior stroke and etiology, residual deficits and pain type symptoms, importance of managing stroke risk factors including HTN, HLD for possible underlying sleep apnea and answered all other questions to patient satisfaction   Frann Rider, AGNP-BC  [facility] [address] [place], [place] [address]  Phone [phone] Fax [phone] Note: This document was prepared with digital dictation and possible smart phrase technology. Any transcriptional errors that result from this process are unintentional.

## 2020-12-01 NOTE — Progress Notes (Signed)
I agree with the above plan 

## 2020-12-03 MED ORDER — SILDENAFIL CITRATE 100 MG PO TABS: 100.0000 mg | ORAL_TABLET | Freq: Every day | ORAL | 3 refills | Status: DC | PRN

## 2020-12-03 NOTE — Addendum Note (Signed)
Addended by: Truddie Hidden on: 12/03/2020 10:13 AM   Modules accepted: Orders

## 2020-12-28 ENCOUNTER — Ambulatory Visit: Payer: Medicare HMO | Admitting: Cardiology

## 2020-12-31 ENCOUNTER — Other Ambulatory Visit: Payer: Self-pay | Admitting: Family Medicine

## 2021-01-01 ENCOUNTER — Other Ambulatory Visit: Payer: Self-pay | Admitting: Family Medicine

## 2021-01-01 DIAGNOSIS — L719 Rosacea, unspecified: Secondary | ICD-10-CM

## 2021-01-06 ENCOUNTER — Other Ambulatory Visit: Payer: Self-pay | Admitting: Cardiology

## 2021-01-07 NOTE — Telephone Encounter (Signed)
Refill sent to pharmacy.   

## 2021-01-08 MED ORDER — GABAPENTIN 300 MG PO CAPS: 300.0000 mg | ORAL_CAPSULE | Freq: Every day | ORAL | 2 refills | Status: DC

## 2021-01-11 ENCOUNTER — Encounter: Payer: Self-pay | Admitting: Adult Health

## 2021-01-11 DIAGNOSIS — I639 Cerebral infarction, unspecified: Secondary | ICD-10-CM

## 2021-01-11 DIAGNOSIS — M79604 Pain in right leg: Secondary | ICD-10-CM

## 2021-01-11 DIAGNOSIS — I6381 Other cerebral infarction due to occlusion or stenosis of small artery: Secondary | ICD-10-CM

## 2021-01-14 ENCOUNTER — Ambulatory Visit: Payer: Medicare HMO | Admitting: Sports Medicine

## 2021-01-14 ENCOUNTER — Encounter (HOSPITAL_COMMUNITY): Payer: Self-pay | Admitting: Emergency Medicine

## 2021-01-14 ENCOUNTER — Emergency Department (HOSPITAL_COMMUNITY): Payer: Medicare HMO

## 2021-01-14 ENCOUNTER — Other Ambulatory Visit: Payer: Self-pay

## 2021-01-14 ENCOUNTER — Emergency Department (HOSPITAL_COMMUNITY)
Admission: EM | Admit: 2021-01-14 | Discharge: 2021-01-14 | Disposition: A | Discharge status: Home / Self Care | Payer: Medicare HMO | Attending: Emergency Medicine | Admitting: Emergency Medicine

## 2021-01-14 DIAGNOSIS — I10 Essential (primary) hypertension: Secondary | ICD-10-CM | POA: Diagnosis not present

## 2021-01-14 DIAGNOSIS — R109 Unspecified abdominal pain: Secondary | ICD-10-CM

## 2021-01-14 DIAGNOSIS — Z85828 Personal history of other malignant neoplasm of skin: Secondary | ICD-10-CM | POA: Insufficient documentation

## 2021-01-14 DIAGNOSIS — M549 Dorsalgia, unspecified: Secondary | ICD-10-CM | POA: Insufficient documentation

## 2021-01-14 DIAGNOSIS — Z79899 Other long term (current) drug therapy: Secondary | ICD-10-CM | POA: Insufficient documentation

## 2021-01-14 DIAGNOSIS — Z87891 Personal history of nicotine dependence: Secondary | ICD-10-CM | POA: Diagnosis not present

## 2021-01-14 DIAGNOSIS — Z87442 Personal history of urinary calculi: Secondary | ICD-10-CM | POA: Insufficient documentation

## 2021-01-14 DIAGNOSIS — R1011 Right upper quadrant pain: Secondary | ICD-10-CM | POA: Diagnosis not present

## 2021-01-14 DIAGNOSIS — I700 Atherosclerosis of aorta: Secondary | ICD-10-CM | POA: Insufficient documentation

## 2021-01-14 DIAGNOSIS — K209 Esophagitis, unspecified without bleeding: Secondary | ICD-10-CM | POA: Diagnosis not present

## 2021-01-14 DIAGNOSIS — K760 Fatty (change of) liver, not elsewhere classified: Secondary | ICD-10-CM | POA: Diagnosis not present

## 2021-01-14 DIAGNOSIS — R1031 Right lower quadrant pain: Secondary | ICD-10-CM | POA: Diagnosis not present

## 2021-01-14 HISTORY — DX: Unspecified abdominal pain: R10.9

## 2021-01-14 LAB — CBC
HCT: 43.3 % (ref 39.0–52.0)
Hemoglobin: 14.7 g/dL (ref 13.0–17.0)
MCH: 30 pg (ref 26.0–34.0)
MCHC: 33.9 g/dL (ref 30.0–36.0)
MCV: 88.4 fL (ref 80.0–100.0)
Platelets: 287 10*3/uL (ref 150–400)
RBC: 4.9 MIL/uL (ref 4.22–5.81)
RDW: 13.2 % (ref 11.5–15.5)
WBC: 8.3 10*3/uL (ref 4.0–10.5)
nRBC: 0 % (ref 0.0–0.2)

## 2021-01-14 LAB — URINALYSIS, ROUTINE W REFLEX MICROSCOPIC
Bilirubin Urine: NEGATIVE
Glucose, UA: NEGATIVE mg/dL
Hgb urine dipstick: NEGATIVE
Ketones, ur: NEGATIVE mg/dL
Leukocytes,Ua: NEGATIVE
Nitrite: NEGATIVE
Protein, ur: NEGATIVE mg/dL
Specific Gravity, Urine: 1.015 (ref 1.005–1.030)
pH: 6 (ref 5.0–8.0)

## 2021-01-14 LAB — COMPREHENSIVE METABOLIC PANEL
ALT: 28 U/L (ref 0–44)
AST: 22 U/L (ref 15–41)
Albumin: 4.6 g/dL (ref 3.5–5.0)
Alkaline Phosphatase: 79 U/L (ref 38–126)
Anion gap: 11 (ref 5–15)
BUN: 22 mg/dL (ref 8–23)
CO2: 27 mmol/L (ref 22–32)
Calcium: 9.4 mg/dL (ref 8.9–10.3)
Chloride: 97 mmol/L — ABNORMAL LOW (ref 98–111)
Creatinine, Ser: 1.25 mg/dL — ABNORMAL HIGH (ref 0.61–1.24)
GFR, Estimated: >60 mL/min (ref >60)
Glucose, Bld: 107 mg/dL — ABNORMAL HIGH (ref 70–99)
Potassium: 3.8 mmol/L (ref 3.5–5.1)
Sodium: 135 mmol/L (ref 135–145)
Total Bilirubin: 0.3 mg/dL (ref 0.3–1.2)
Total Protein: 7.5 g/dL (ref 6.5–8.1)

## 2021-01-14 LAB — LIPASE, BLOOD: Lipase: 32 U/L (ref 11–51)

## 2021-01-14 MED ORDER — SUCRALFATE 1 GM/10ML PO SUSP: 1.0000 g | Freq: Three times a day (TID) | ORAL | 0 refills | Status: DC

## 2021-01-14 MED ORDER — DOCUSATE SODIUM 100 MG PO CAPS: 100.0000 mg | ORAL_CAPSULE | Freq: Two times a day (BID) | ORAL | 0 refills | Status: DC

## 2021-01-14 MED ORDER — HYDROMORPHONE HCL 1 MG/ML IJ SOLN
0.5000 mg | Freq: Once | INTRAMUSCULAR | Status: AC
  Administered 2021-01-14: 0.5 mg via INTRAVENOUS
  Filled 2021-01-14: qty 1

## 2021-01-14 MED ORDER — HYDROCODONE-ACETAMINOPHEN 5-325 MG PO TABS: 1.0000 | ORAL_TABLET | Freq: Four times a day (QID) | ORAL | 0 refills | Status: DC | PRN

## 2021-01-14 MED ORDER — PANTOPRAZOLE SODIUM 40 MG IV SOLR
40.0000 mg | Freq: Once | INTRAVENOUS | Status: AC
  Administered 2021-01-14: 40 mg via INTRAVENOUS
  Filled 2021-01-14: qty 40

## 2021-01-14 MED ORDER — LACTATED RINGERS IV SOLN: INTRAVENOUS | Status: DC

## 2021-01-14 MED ORDER — PANTOPRAZOLE SODIUM 20 MG PO TBEC: 20.0000 mg | DELAYED_RELEASE_TABLET | Freq: Two times a day (BID) | ORAL | 0 refills | Status: DC

## 2021-01-14 MED ORDER — IOHEXOL 300 MG/ML  SOLN
100.0000 mL | Freq: Once | INTRAMUSCULAR | Status: AC | PRN
  Administered 2021-01-14: 100 mL via INTRAVENOUS

## 2021-01-14 NOTE — ED Notes (Signed)
Patient transported to CT 

## 2021-01-14 NOTE — Assessment & Plan Note (Addendum)
Patient is having pain in right back and right sided abdominal pain. His symptoms have been present intermittently for several months, but recently worse in last month and acutely worse in last 4-5 days. Patient also having concerning symptoms of difficulty swallowing. Based on history and exam, this problem does not appear to be musculoskeletal in etiology. DDx is broad; considered shingles, but due to long chronicity and no e/o vesicular lesions, do not suspect VZV at this time. Hx and exam certainly concerning for visceral mass causing broad ranging symptoms and referred pain. 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 At that time patient was having these symptoms, but possible since it was vague and seemed more like back pain w/o other history of difficulty swallowing, no postprandial abdominal pain, weight stable, etc, that it was not connected as to being related to abdominal pain. Cannot see the original vascular US from 02/24/20, but patient was referred to vascular surgery per note, he saw Dr. Carlis Abbott in may 2021, who recommended surveillance as long as patient was asymptomatic. Per Dr. 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 Patient precepted with Dr. Micheline Chapman; we offered him short course of oral prednisone which may help if msk or neuropathic in etiology. Recommended patient follow up with PCP, attempted to assist patient by calling [facility] for same day appt. He prefers to go to ED due to severity of pain.

## 2021-01-14 NOTE — ED Provider Notes (Signed)
[facility] EMERGENCY DEPARTMENT Provider Note   CSN: [id number] Arrival date & time: 01/14/21  0946     History Chief Complaint  Patient presents with  . Abdominal Pain    Thomas [name] is a 70 y.o. male.  HPI Patient has been experiencing pain in his right upper abdomen and right shoulder blade for several weeks.  Symptoms got much worse over the past 2 nights.  He reports last night it was difficult for him to sleep.  He cannot find a comfortable position.  He reports in conjunction with that he is also getting reflux symptoms.  She is experiencing burning in his epigastrium coming up in his chest.  He is not taking any reflux medications.  No fever, no vomiting.  He reports he ate [place] bowls last night.  He has having normal bowel movements.  No constipation or blood in stool.  Patient reports has been having some symptoms for quite a while.  He reports that abdominal pain was initially thought to be an oblique muscular strain and he has been doing some physical therapy for it.  Now however with a significant worsening he is coming for diagnostic evaluation.  He has provided a sheet outlining duration of symptoms.  As follows: Short term: 3 to 4 weeks Puffiness right side\groin Episodes of difficulty in swallowing Intense side and localized back pain *Worse at night *Sitting makes it worse *Stretching and exercise makes it worse Sleeps on back only Bad episodes at night acid reflux Groin pain on occasion  Longer term: 6 to 9 months Infrequent coughing episodes with long duration Bulging and pain in groin right side Tightness and pain right obliques right side Regular back and hip pain lower back both hips PT made it worse and had to stop it  First noticed: June 2021 (after stroke May 2021) Oblique side pain, hip and back pain, right side only Neuropathic pain right side thigh to foot      Past Medical History:  Diagnosis Date  . Avulsed  toenail, initial encounter 05/04/2018  . Chronic gout    multiple sites--- followed by pcp--- (04-14-2018  per pt last flare-up , march 2019)  . Dyspnea on exertion 02/10/2020  . ED (erectile dysfunction)   . Epidermoid cyst of neck 10/16/2017  . Essential hypertension 07/21/2017  . Gout 01/19/2014  . Gout of multiple sites 07/21/2017  . History of kidney stones   . History of squamous cell carcinoma excision   . Hyperlipidemia   . Hypertension   . Hypertension not at goal 01/19/2014  . Left ureteral calculus   . Lower abdominal pain 08/27/2015  . Multiple joint pain 07/16/2018  . Pain in joint, shoulder region 12/11/2015  . Preventative health care 11/14/2016  . Urgency of urination   . URI (upper respiratory infection) 11/08/2014  . Wears glasses     Patient Active Problem List   Diagnosis Date Noted  . Right sided abdominal pain 01/14/2021  . 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 Acute ischemic stroke ([place]) 03/22/2020  . It band syndrome, left 03/19/2020  . Metatarsalgia of both feet 03/19/2020  . 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 Dyspnea on exertion 02/10/2020  . Multiple joint pain 07/16/2018  . Avulsed toenail, initial encounter 05/04/2018  . Epidermoid cyst of neck 10/16/2017  . Essential hypertension 07/21/2017  . Gout of multiple sites 07/21/2017  . Preventative health care 11/14/2016  . Pain in joint, shoulder region  12/11/2015  . Lower abdominal pain 08/27/2015  . URI (upper respiratory infection) 11/08/2014  . Gout 01/19/2014  . Hypertension not at goal 01/19/2014    Past Surgical History:  Procedure Laterality Date  . COLONOSCOPY WITH PROPOFOL  2017  . CYSTOSCOPY/RETROGRADE/URETEROSCOPY/STONE EXTRACTION WITH BASKET Left 04/16/2018   Procedure: CYSTOSCOPY/RETROGRADE/URETEROSCOPY/STONE EXTRACTION WITH BASKET/ HOLMIUM LASER LITHOTRIPSY/ STENT PLACEMENT;  Surgeon: Ardis Hughs, MD;  Location: [facility];   Service: Urology;  Laterality: Left;  . HOLMIUM LASER APPLICATION Left 02/13/5187   Procedure: HOLMIUM LASER APPLICATION;  Surgeon: Ardis Hughs, MD;  Location: [facility];  Service: Urology;  Laterality: Left;       Family History  Problem Relation Age of Onset  . Heart disease Mother 26  . Alcohol abuse Mother   . Depression Mother   . Cancer Sister        hodgkins, breast  . Hypertension Father   . Colon cancer Neg Hx   . Pancreatic cancer Neg Hx   . Rectal cancer Neg Hx   . Stomach cancer Neg Hx     Social History   Tobacco Use  . Smoking status: Former Smoker    Packs/day: 0.30    Years: 20.00    Pack years: 6.00    Types: Cigarettes    Quit date: 10/17/1994    Years since quitting: 26.2  . Smokeless tobacco: Never Used  Substance Use Topics  . Alcohol use: Yes    Alcohol/week: 2.0 standard drinks    Types: 2 Glasses of wine per week    Comment: rare  . Drug use: No    Home Medications Prior to Admission medications   Medication Sig Start Date End Date Taking? Authorizing Provider  docusate sodium (COLACE) 100 MG capsule Take 1 capsule (100 mg total) by mouth every 12 (twelve) hours. 01/14/21  Yes Charlesetta Shanks, MD  HYDROcodone-acetaminophen (NORCO/VICODIN) 5-325 MG tablet Take 1-2 tablets by mouth every 6 (six) hours as needed for moderate pain or severe pain. 01/14/21  Yes Charlesetta Shanks, MD  pantoprazole (PROTONIX) 20 MG tablet Take 1 tablet (20 mg total) by mouth 2 (two) times daily. 01/14/21  Yes Charlesetta Shanks, MD  sucralfate (CARAFATE) 1 GM/10ML suspension Take 10 mLs (1 g total) by mouth 4 (four) times daily -  with meals and at bedtime. 01/14/21  Yes Charlesetta Shanks, MD  allopurinol (ZYLOPRIM) 300 MG tablet Take 300 mg by mouth daily. 05/14/20   [provider]  amLODipine (NORVASC) 5 MG tablet Take 1 tablet (5 mg total) by mouth daily. 06/22/20 09/20/20  Park Liter, MD  b complex vitamins tablet Take 1 tablet by mouth  daily.    [provider]  Calcium-Magnesium-Vitamin D (CALCIUM MAGNESIUM PO) Take 1 tablet by mouth daily.    [provider]  cloNIDine (CATAPRES) 0.1 MG tablet Take 0.1 mg by mouth 2 (two) times daily. 08/02/20   [provider]  colchicine 0.6 MG tablet Take 0.6 mg by mouth daily. 10/21/20   [provider]  ezetimibe (ZETIA) 10 MG tablet Take 1 tablet (10 mg total) by mouth daily. 08/24/20 11/22/20  Park Liter, MD  gabapentin (NEURONTIN) 300 MG capsule Take 1 capsule (300 mg total) by mouth at bedtime. 01/08/21   Dene Gentry, MD  hydrochlorothiazide (HYDRODIURIL) 25 MG tablet TAKE 1 TABLET BY MOUTH EVERY DAY 01/07/21   Park Liter, MD  meloxicam (MOBIC) 15 MG tablet Take 1 tablet (15 mg total)  by mouth daily. 10/31/20   Hudnall, Sharyn Lull, MD  minoxidil (LONITEN) 2.5 MG tablet TAKE 2 TABLETS BY MOUTH TWICE A DAY 11/02/20   Park Liter, MD  pravastatin (PRAVACHOL) 20 MG tablet Take 20 mg by mouth daily. 10/06/20   [provider]  sildenafil (VIAGRA) 100 MG tablet Take 1 tablet (100 mg total) by mouth daily as needed for erectile dysfunction. 12/03/20   Park Liter, MD  valsartan (DIOVAN) 320 MG tablet Take 1 tablet (320 mg total) by mouth daily. 11/29/20   Park Liter, MD    Allergies    Patient has no active allergies.  Review of Systems   Review of Systems 10 systems reviewed and negative except as per HPI Physical Exam Updated Vital Signs BP (!) 122/58   Pulse 71   Temp 98 F (36.7 C) (Oral)   Resp 14   SpO2 97%   Physical Exam Constitutional:      Comments: Alert and nontoxic.  Clinically well in appearance  Eyes:     Extraocular Movements: Extraocular movements intact.     Conjunctiva/sclera: Conjunctivae normal.  Cardiovascular:     Rate and Rhythm: Normal rate and regular rhythm.     Pulses: Normal pulses.     Heart sounds: Normal heart sounds.  Pulmonary:     Effort: Pulmonary effort  is normal.     Breath sounds: Normal breath sounds.  Abdominal:     Comments: Abdomen is nondistended.  Soft.  Moderate and significant reducible pain right upper quadrant and right lower quadrant.  No mass or fullness in the right inguinal canal.  Left abdomen nontender.  No CVA tenderness to percussion.  No rashes on back or chest wall.  Musculoskeletal:        General: No swelling or tenderness. Normal range of motion.     Right lower leg: No edema.     Left lower leg: No edema.  Skin:    General: Skin is warm and dry.  Neurological:     General: No focal deficit present.     Mental Status: He is oriented to person, place, and time.     Coordination: Coordination normal.  Psychiatric:        Mood and Affect: Mood normal.     ED Results / Procedures / Treatments   Labs (all labs ordered are listed, but only abnormal results are displayed) Labs Reviewed  COMPREHENSIVE METABOLIC PANEL - Abnormal; Notable for the following components:      Result Value   Chloride 97 (*)    Glucose, Bld 107 (*)    Creatinine, Ser 1.25 (*)    All other components within normal limits  LIPASE, BLOOD  CBC  URINALYSIS, ROUTINE W REFLEX MICROSCOPIC    EKG None  Radiology CT Abdomen Pelvis W Contrast  Result Date: 01/14/2021 CLINICAL DATA:  Right lower quadrant pain for a month with worsening in the past few days. EXAM: CT ABDOMEN AND PELVIS WITH CONTRAST TECHNIQUE: Multidetector CT imaging of the abdomen and pelvis was performed using the standard protocol following bolus administration of intravenous contrast. CONTRAST:  142mL OMNIPAQUE IOHEXOL 300 MG/ML  SOLN COMPARISON:  03/29/2018. FINDINGS: Lower chest: Lung bases are clear. Heart is at the upper limits of normal in size to mildly enlarged. No pericardial or pleural effusion. There may be slight distal esophageal wall thickening which can be seen with gastroesophageal reflux. Hepatobiliary: Liver is slightly decreased in attenuation diffusely.  Low-attenuation lesions in the right  hepatic lobe measure up to 1.3 cm, similar and likely benign cysts. Liver and gallbladder are otherwise unremarkable. No biliary ductal dilatation. Pancreas: Negative. Spleen: Negative. Adrenals/Urinary Tract: Adrenal glands are unremarkable. Low-attenuation lesions in the kidneys measure up to 12 mm on the left and are likely cysts. Kidneys are otherwise unremarkable. Ureters are decompressed. Bladder is grossly unremarkable. Stomach/Bowel: Stomach, small bowel and appendix are unremarkable. There is a fair amount of stool in the ascending and transverse colon. Colon is otherwise unremarkable. Vascular/Lymphatic: Atherosclerotic calcification of the aorta with luminal narrowing. No pathologically enlarged lymph nodes. Reproductive: Prostate is minimally prominent. Other: No free fluid. Mesenteries and peritoneum are otherwise unremarkable. Musculoskeletal: Degenerative changes in the spine. No worrisome lytic or sclerotic lesions. IMPRESSION: 1. No acute findings to explain the patient's pain. Fair amount of stool in the ascending and transverse colon suggests constipation. 2. Hepatic steatosis. 3. Slightly prominent prostate. 4.  Aortic atherosclerosis (ICD10-I70.0). Electronically Signed   By: Lorin Picket M.D.   On: 01/14/2021 13:03    Procedures Procedures   Medications Ordered in ED Medications  lactated ringers infusion ( Intravenous New Bag/Given 01/14/21 1123)  HYDROmorphone (DILAUDID) injection 0.5 mg (0.5 mg Intravenous Given 01/14/21 1123)  pantoprazole (PROTONIX) injection 40 mg (40 mg Intravenous Given 01/14/21 1122)  iohexol (OMNIPAQUE) 300 MG/ML solution 100 mL (100 mLs Intravenous Contrast Given 01/14/21 1252)    ED Course  I have reviewed the triage vital signs and the nursing notes.  Pertinent labs & imaging results that were available during my care of the patient were reviewed by me and considered in my medical decision making (see chart for  details).    MDM Rules/Calculators/A&P                         Patient presents with right-sided back pain at about the scapula which corresponds with right upper abdominal pain.  Patient is experiencing symptoms worse at night.  He is also experiencing significant reflux symptoms.  Pain is reproducible on exam.  At this time I have suspicion for gallbladder or renal etiology.  We will proceed with diagnostic labs and CT.  Will initiate IV fluid and Dilaudid for pain control.  CT shows esophageal thickening.  Patient symptoms are very suggestive of esophagitis and suspected gastritis\GERD.  There is some right upper quadrant tenderness.  I have discussed with the patient the potential benefit of a HIDA scan to further evaluate the gallbladder in the event symptoms do not improve and are not definitively diagnosed in follow-up.  Patient is counseled that he needs upper endoscopy to further evaluate esophagitis and potential peptic ulcer disease.  At this time plan will be to start aggressive therapy for gastritis with Protonix twice daily and Carafate with dietary instructions.  Recommendations for close follow-up with PCP for referral to GI for upper endoscopy and possible HIDA scan depending on ongoing symptoms.  Patient is also counseled to watch for any development of rash that would suggest shingles as the distribution is appropriate, however with long duration of symptoms this seems much more unlikely. Final Clinical Impression(s) / ED Diagnoses Final diagnoses:  Right upper quadrant abdominal pain  Esophagitis    Rx / DC Orders ED Discharge Orders         Ordered    pantoprazole (PROTONIX) 20 MG tablet  2 times daily        01/14/21 1418    sucralfate (CARAFATE) 1 GM/10ML suspension  3 times  daily with meals & bedtime        01/14/21 1418    HYDROcodone-acetaminophen (NORCO/VICODIN) 5-325 MG tablet  Every 6 hours PRN        01/14/21 1418    docusate sodium (COLACE) 100 MG capsule  Every  12 hours        01/14/21 1418           Charlesetta Shanks, MD 01/14/21 1431

## 2021-01-14 NOTE — Progress Notes (Signed)
SUBJECTIVE:   CHIEF COMPLAINT / HPI: right back and side pain  70 yo man known to [facility] presents today for follow up of right back and side pain. He has previously been seen for this problem, last visit in 12/21, at that time thought to be a serratous strain that improved with rest and heat. Patient reports that in the last 3-4 weeks he has had increasing sharp, aching pain that is difficult to pinpoint: starts underneath scapula and wraps around right side to RUQ. He also reports occasional sharp pain in his right groin. The pain has been much worse since last Thursday and patient has not slept the last two nights. He has tried gabapentin and meloxicam, which do not help this pain. Only heat seems to help relieve the pain somewhat. He denies fevers, chills, SOB, CP, n/v/d, denies incontinence and saddle anesthesia. He reports sometimes having difficulty swallowing. He had a stroke in May 2021 and reports some residual deficits with sensation on right lower leg.   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 He has taken his valsartan today, takes amlodipine and HCTZ at night, last dose last night 2/27. He does not take clonidine. He is asymptomatic presently, reports that he takes his BP regularly and is usually around 140/70, believes his elevated BP today is 2/2 pain.  PERTINENT  PMH / PSH: HTN, h/o CVA  OBJECTIVE:   BP (!) 172/78   Ht 5' 10.5" (1.791 m)   Wt 210 lb (95.3 kg)   BMI 29.71 kg/m   Nursing note and vitals reviewed GEN: age-appropriate, WM, resting comfortably in chair, NAD, WNWD HEENT: NCAT. Sclera without injection or icterus.  Abdomen: Normoactive bowel sounds, no bruits. No tenderness to deep or light palpation. No rebound or guarding. No HSM. No inguinal hernia palpated. Back:   Spine with normal alignment and no deformity.  No tenderness to vertebral process palpation.  Left paraspinous muscles are mildly tender, point tenderness near left scapula. Range of motion is full at  neck and lumbar sacral regions Neuro:  Motor- Tone is normal. Bulk is normal. 5/5 strength was present in all four extremities.  Sensory: Sensation is symmetric to light touch in the arms and legs. Deep Tendon Reflexes: 2+ and symmetric in the biceps and patellae.  Skin: hyperpigmentation along left paraspinal/mid-thoracic area Psych: Pleasant and appropriate   ASSESSMENT/PLAN:   Right sided abdominal pain Patient is having pain in right back and right sided abdominal pain. His symptoms have been present intermittently for several months, but recently worse in last month and acutely worse in last 4-5 days. Patient also having concerning symptoms of difficulty swallowing. Based on history and exam, this problem does not appear to be musculoskeletal in etiology. DDx is broad; considered shingles, but due to long chronicity and no e/o vesicular lesions, do not suspect VZV at this time. Hx and exam certainly concerning for visceral mass causing broad ranging symptoms and referred pain. 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 At that time patient was having these symptoms, but possible since it was vague and seemed more like back pain w/o other history of difficulty swallowing, no postprandial abdominal pain, weight stable, etc, that it was not connected as to being related to abdominal pain. Cannot see the original vascular US from 02/24/20, but patient was referred to vascular surgery per note, he saw Dr. Carlis Abbott in  may 2021, who recommended surveillance as long as patient was asymptomatic. Per Dr. 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 Patient precepted with Dr. Micheline Chapman; we offered him short course of oral prednisone which may help if msk or neuropathic in  etiology. Recommended patient follow up with PCP, attempted to assist patient by calling [facility] for same day appt. He prefers to go to ED due to severity of pain.      Gladys Damme, MD [place]   Patient seen and evaluated with the resident.  I agree with the above plan of care.  Patient's symptoms are concerning for some sort of intra-abdominal process.  Patient prefers to proceed to the emergency room for evaluation rather than following up with his PCP.

## 2021-01-14 NOTE — ED Triage Notes (Signed)
Patient coming from home. Complaint of abdominal pain x4 weeks. Worse the past few days. Hx of stroke with some residual right sided deficits.

## 2021-01-14 NOTE — Discharge Instructions (Signed)
1.  Your CT scan shows thickening and inflammation of your esophagus.  This reflects probable long-term reflux and gastritis.  The symptoms are made worse by anti-inflammatory medication such as meloxicam, ibuprofen/motrin, Aleve/naproxen, any medications that are in the category of NSAIDs.  You must avoid ALL medications of this category at this time, if you are unsure, ask your doctor or pharmacist.  At this time, you are being prescribed Vicodin to take for pain.  This contains 325 mg of acetaminophen per tablet.  Do not take acetaminophen in addition to Vicodin, except, if you take 1 Vicodin tablet you may take 1 additional acetaminophen tablet without exceeding a normal 4-hour dose.  If you take 2 Vicodin tablets, you have taken 650 mg dose of acetaminophen.  Because Vicodin can cause constipation, take Colace twice daily while you are taking that pain medication. 2.  Esophagitis and reflux disease are treated with medications to decrease acid secretion.  You are being prescribed Protonix.  Take this twice daily as prescribed.  For the first week take Carafate as prescribed to help protect and coat your stomach. 3.  Follow dietary instructions for gallbladder disease.  At this time, your gallbladder does not show stones or inflammation, however your symptoms are somewhat suggestive of gallbladder problems and these can occur as spasms without stones or inflammation.  This condition is further evaluated by a HIDA scan.  Information about HIDA scans is included in your discharge instructions.  Follow the dietary recommendations closely for gallbladder disease and for gastroesophageal reflux. 4.  Esophagitis must be further confirmed by upper endoscopy.  See your doctor soon as possible to help schedule a follow-up upper endoscopy to evaluate severity of esophagitis and any presence of ulcers or inflammation of the stomach lining. 5.  Your pain is described has been longstanding.  However, some people with  pain in the distribution you are describing develop a rash called shingles.  If you start to see any blistering rash in the area of your pain, call your doctor or return to the emergency department because starting medication for this as soon as possible improves the response to treatment. 6.  Return to the emergency department if you develop vomiting, suddenly increasing pain, fever or other concerning symptoms.

## 2021-01-15 ENCOUNTER — Ambulatory Visit: Payer: Medicare HMO | Admitting: Sports Medicine

## 2021-01-15 DIAGNOSIS — I119 Hypertensive heart disease without heart failure: Secondary | ICD-10-CM | POA: Diagnosis not present

## 2021-01-15 DIAGNOSIS — R1011 Right upper quadrant pain: Secondary | ICD-10-CM | POA: Diagnosis not present

## 2021-01-15 DIAGNOSIS — I700 Atherosclerosis of aorta: Secondary | ICD-10-CM | POA: Diagnosis not present

## 2021-01-15 DIAGNOSIS — Z87891 Personal history of nicotine dependence: Secondary | ICD-10-CM | POA: Diagnosis not present

## 2021-01-17 ENCOUNTER — Other Ambulatory Visit (HOSPITAL_COMMUNITY): Payer: Self-pay | Admitting: Internal Medicine

## 2021-01-17 ENCOUNTER — Other Ambulatory Visit: Payer: Self-pay | Admitting: Internal Medicine

## 2021-01-17 ENCOUNTER — Encounter: Payer: Self-pay | Admitting: Gastroenterology

## 2021-01-17 DIAGNOSIS — R1011 Right upper quadrant pain: Secondary | ICD-10-CM

## 2021-01-22 ENCOUNTER — Other Ambulatory Visit: Payer: Self-pay

## 2021-01-22 DIAGNOSIS — M1A9XX0 Chronic gout, unspecified, without tophus (tophi): Secondary | ICD-10-CM | POA: Insufficient documentation

## 2021-01-22 DIAGNOSIS — I10 Essential (primary) hypertension: Secondary | ICD-10-CM | POA: Insufficient documentation

## 2021-01-22 DIAGNOSIS — R3915 Urgency of urination: Secondary | ICD-10-CM | POA: Insufficient documentation

## 2021-01-22 DIAGNOSIS — Z973 Presence of spectacles and contact lenses: Secondary | ICD-10-CM | POA: Insufficient documentation

## 2021-01-22 DIAGNOSIS — Z9889 Other specified postprocedural states: Secondary | ICD-10-CM | POA: Insufficient documentation

## 2021-01-22 DIAGNOSIS — Z859 Personal history of malignant neoplasm, unspecified: Secondary | ICD-10-CM | POA: Insufficient documentation

## 2021-01-22 DIAGNOSIS — N529 Male erectile dysfunction, unspecified: Secondary | ICD-10-CM | POA: Insufficient documentation

## 2021-01-22 DIAGNOSIS — N201 Calculus of ureter: Secondary | ICD-10-CM | POA: Insufficient documentation

## 2021-01-22 DIAGNOSIS — E785 Hyperlipidemia, unspecified: Secondary | ICD-10-CM | POA: Insufficient documentation

## 2021-01-22 DIAGNOSIS — Z87442 Personal history of urinary calculi: Secondary | ICD-10-CM | POA: Insufficient documentation

## 2021-01-25 ENCOUNTER — Other Ambulatory Visit: Payer: Self-pay

## 2021-01-25 ENCOUNTER — Encounter: Payer: Self-pay | Admitting: Cardiology

## 2021-01-25 ENCOUNTER — Ambulatory Visit: Payer: Medicare HMO | Admitting: Cardiology

## 2021-01-25 VITALS — BP 156/74 | HR 85 | Ht 71.0 in | Wt 213.0 lb

## 2021-01-25 DIAGNOSIS — I6930 Unspecified sequelae of cerebral infarction: Secondary | ICD-10-CM

## 2021-01-25 DIAGNOSIS — I10 Essential (primary) hypertension: Secondary | ICD-10-CM

## 2021-01-25 DIAGNOSIS — E781 Pure hyperglyceridemia: Secondary | ICD-10-CM

## 2021-01-25 MED ORDER — MINOXIDIL 2.5 MG PO TABS: ORAL_TABLET | ORAL | 6 refills | Status: DC

## 2021-01-25 NOTE — Patient Instructions (Signed)
Medication Instructions:  Your physician has recommended you make the following change in your medication:  START: Minoxidil 2.5 mg 3 tablets in the morning.  Minoxidil 2.5 mg 2 tablets at night. *If you need a refill on your cardiac medications before your next appointment, please call your pharmacy*   Lab Work: Your physician recommends that you return for lab work: In 1 week:  BMET, Lipids (Come fasting). If you have labs (blood work) drawn today and your tests are completely normal, you will receive your results only by: Marland Kitchen MyChart Message (if you have MyChart) OR . A paper copy in the mail If you have any lab test that is abnormal or we need to change your treatment, we will call you to review the results.   Testing/Procedures: None   Follow-Up: At [facility], you and your health needs are our priority.  As part of our continuing mission to provide you with exceptional heart care, we have created designated Provider Care Teams.  These Care Teams include your primary Cardiologist (physician) and Advanced Practice Providers (APPs -  Physician Assistants and Nurse Practitioners) who all work together to provide you with the care you need, when you need it.  We recommend signing up for the patient portal called "MyChart".  Sign up information is provided on this After Visit Summary.  MyChart is used to connect with patients for Virtual Visits (Telemedicine).  Patients are able to view lab/test results, encounter notes, upcoming appointments, etc.  Non-urgent messages can be sent to your provider as well.   To learn more about what you can do with MyChart, go to [email or web address].    Your next appointment:   3 month(s)  The format for your next appointment:   In Person  Provider:   Jenne Campus, MD   Other Instructions

## 2021-01-25 NOTE — Progress Notes (Signed)
Cardiology Office Note:    Date:  01/25/2021   ID:  Thomas [name], DOB 02/21/1951, MRN [id number]  PCP:  Michael Boston, MD  Cardiologist:  Jenne Campus, MD    Referring MD: Michael Boston, MD   Chief Complaint  Patient presents with  . Follow-up    History of Present Illness:    Thomas [name] is a 70 y.o. male with past medical history significant for essential hypertension which appears to be difficult to control, eventually had relatively decent control of his blood pressure but then he developed gout and we had to withdraw temporally diuretics now he is back on those medications but in spite of that his blood pressure still elevated.  Work-up for secondary cause of hypertension has been unrevealing, however, possibility of sleep apnea has not been investigated yet. He comes today 2 months for follow-up overall he seems to be doing well denies have any chest pain tightness squeezing pressure burning chest.  He still complain of having some pain he still think maybe it is a gout but overall seems to be holding quite well the biggest problem however is the fact that he is complaining of pain in the right upper quadrant of the abdomen.  He had up going to the emergency room CT of his abdomen was done did reveal some steatosis of the liver but otherwise no pathology.  He is scheduled to see GI [facility] which I think will be beneficial for him.  Past Medical History:  Diagnosis Date  . Acute ischemic stroke ([place]) 03/22/2020  . Avulsed toenail, initial encounter 05/04/2018  . Chronic gout    multiple sites--- followed by pcp--- (04-14-2018  per pt last flare-up , march 2019)  . Dyspnea on exertion 02/10/2020  . ED (erectile dysfunction)   . Epidermoid cyst of neck 10/16/2017  . Essential hypertension 07/21/2017  . Gout 01/19/2014  . Gout of multiple sites 07/21/2017  . History of kidney stones   . History of squamous cell carcinoma excision   . Hyperlipidemia   . Hypertension   . Hypertension  not at goal 01/19/2014  . It band syndrome, left 03/19/2020  . 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 Left ureteral calculus   . Lower abdominal pain 08/27/2015  . Metatarsalgia of both feet 03/19/2020  . Multiple joint pain 07/16/2018  . Pain in joint, shoulder region 12/11/2015  . Preventative health care 11/14/2016  . Right sided abdominal pain 01/14/2021  . 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 Urgency of urination   . URI (upper respiratory infection) 11/08/2014  . Wears glasses     Past Surgical History:  Procedure Laterality Date  . COLONOSCOPY WITH PROPOFOL  2017  . CYSTOSCOPY/RETROGRADE/URETEROSCOPY/STONE EXTRACTION WITH BASKET Left 04/16/2018   Procedure: CYSTOSCOPY/RETROGRADE/URETEROSCOPY/STONE EXTRACTION WITH BASKET/ HOLMIUM LASER LITHOTRIPSY/ STENT PLACEMENT;  Surgeon: Ardis Hughs, MD;  Location[facility];  Service: Urology;  Laterality: Left;  . HOLMIUM LASER APPLICATION Left 04/23/3709   Procedure: HOLMIUM LASER APPLICATION;  Surgeon: Ardis Hughs, MD;  Location: [facility];  Service: Urology;  Laterality: Left;    Current Medications: Current Meds  Medication Sig  . allopurinol (ZYLOPRIM) 300 MG tablet Take 300 mg by mouth daily.  Marland Kitchen b complex vitamins tablet Take 1 tablet by mouth daily.  . Calcium-Magnesium-Vitamin D (CALCIUM MAGNESIUM PO) Take 1 tablet by mouth daily.  . colchicine 0.6 MG tablet Take 0.6 mg by mouth as needed (gout  flare up).  . gabapentin (NEURONTIN) 300 MG capsule Take 1 capsule (300 mg total) by mouth at bedtime.  . hydrochlorothiazide (HYDRODIURIL) 25 MG tablet TAKE 1 TABLET BY MOUTH EVERY DAY  . minoxidil (LONITEN) 2.5 MG tablet TAKE 2 TABLETS BY MOUTH TWICE A DAY  . sildenafil (VIAGRA) 100 MG tablet Take 1 tablet (100 mg total) by mouth daily as needed for erectile dysfunction.  . traMADol (ULTRAM) 50 MG tablet Take 50 mg by mouth 2 (two) times daily as needed for  severe pain or moderate pain.  . valsartan (DIOVAN) 320 MG tablet Take 1 tablet (320 mg total) by mouth daily.  . [DISCONTINUED] meloxicam (MOBIC) 15 MG tablet Take 1 tablet (15 mg total) by mouth daily.     Allergies:   Patient has no active allergies.   Social History   Socioeconomic History  . Marital status: Married    Spouse name: Not on file  . Number of children: Not on file  . Years of education: Not on file  . Highest education level: Not on file  Occupational History  . Occupation: Pharmacist, hospital  Tobacco Use  . Smoking status: Former Smoker    Packs/day: 0.30    Years: 20.00    Pack years: 6.00    Types: Cigarettes    Quit date: 10/17/1994    Years since quitting: 26.2  . Smokeless tobacco: Never Used  Vaping Use  . Vaping Use: Not on file  Substance and Sexual Activity  . Alcohol use: Yes    Alcohol/week: 2.0 standard drinks    Types: 2 Glasses of wine per week    Comment: rare  . Drug use: No  . Sexual activity: Yes  Other Topics Concern  . Not on file  Social History Narrative   Exercise 1 hour a day ---3-4 days a week   Social Determinants of Health   Financial Resource Strain: Not on file  Food Insecurity: Not on file  Transportation Needs: Not on file  Physical Activity: Not on file  Stress: Not on file  Social Connections: Not on file     Family History: The patient's family history includes Alcohol abuse in his mother; Cancer in his sister; Depression in his mother; Heart disease (age of onset: 62) in his mother; Hypertension in his father. There is no history of Colon cancer, Pancreatic cancer, Rectal cancer, or Stomach cancer. ROS:   Please see the history of present illness.    All 14 point review of systems negative except as described per history of present illness  EKGs/Labs/Other Studies Reviewed:      Recent Labs: 01/14/2021: ALT 28; BUN 22; Creatinine, Ser 1.25; Hemoglobin 14.7; Platelets 287; Potassium 3.8; Sodium 135  Recent Lipid  Panel    Component Value Date/Time   CHOL 215 (H) 06/22/2020 1629   TRIG 139 06/22/2020 1629   HDL 40 06/22/2020 1629   CHOLHDL 5.4 (H) 06/22/2020 1629   CHOLHDL 6.5 03/23/2020 0429   VLDL 55 (H) 03/23/2020 0429   LDLCALC 150 (H) 06/22/2020 1629    Physical Exam:    VS:  BP (!) 156/74 (BP Location: Right Arm)   Pulse 85   Ht 5\' 11"  (1.803 m)   Wt 213 lb (96.6 kg)   SpO2 95%   BMI 29.71 kg/m     Wt Readings from Last 3 Encounters:  01/25/21 213 lb (96.6 kg)  01/14/21 210 lb (95.3 kg)  10/31/20 210 lb (95.3 kg)     GEN:  Well nourished, well developed in no acute distress HEENT: Normal NECK: No JVD; No carotid bruits LYMPHATICS: No lymphadenopathy CARDIAC: RRR, no murmurs, no rubs, no gallops RESPIRATORY:  Clear to auscultation without rales, wheezing or rhonchi  ABDOMEN: Soft, non-tender, non-distended MUSCULOSKELETAL:  No edema; No deformity  SKIN: Warm and dry LOWER EXTREMITIES: no swelling NEUROLOGIC:  Alert and oriented x 3 PSYCHIATRIC:  Normal affect   ASSESSMENT:    1. Essential hypertension   2. Late effect of cerebrovascular accident (CVA)   3. Pure hypertriglyceridemia    PLAN:    In order of problems listed above:  1. Essential hypertension still not perfectly controlled.  I will increase dose of minoxidil from 10 mg daily to 12.5 mg daily.  Check his Chem-7 we will continue with diuretics. 2. Late effect of CVA, no new problems. 3. Dyslipidemia still a problem we continue discussion about potentially using some additional medication to get his cholesterol to control but he is reluctant to consider doing this.  He prefers me to check his cholesterol.  He did change some of his diet and would like to check with his cholesterol is.  If that will not work we may use Nexletol.  I did review his last fasting lipid profile which show LDL of 150 with HDL of 40   Medication Adjustments/Labs and Tests Ordered: Current medicines are reviewed at length with the  patient today.  Concerns regarding medicines are outlined above.  No orders of the defined types were placed in this encounter.  Medication changes: No orders of the defined types were placed in this encounter.   Signed, Park Liter, MD, [facility] 01/25/2021 3:00 PM    [place]

## 2021-01-28 ENCOUNTER — Other Ambulatory Visit: Payer: Self-pay | Admitting: Internal Medicine

## 2021-01-28 ENCOUNTER — Telehealth: Payer: Self-pay | Admitting: Gastroenterology

## 2021-01-28 DIAGNOSIS — R110 Nausea: Secondary | ICD-10-CM

## 2021-01-28 DIAGNOSIS — R1011 Right upper quadrant pain: Secondary | ICD-10-CM

## 2021-01-28 NOTE — Telephone Encounter (Signed)
The pt has not been seen in our office since 2015.  He states he started having right side abd pain that radiates to the back between the shoulder blades.  She has been to the ED and saw his PCP. He has a HIDA scan scheduled for next week.  He will keep that appt and has an appt on 4/14 as a new patient to see Dr Rush Landmark.  He is going to keep the imaging appt and follow up with PCP recommendations and come in for office visit with Dr Rush Landmark if needed.  PCP states he may need to see surgeon depending on HIDA results.  He was advises that if he has severe pain episode again he should go to the ED for evaluation.  The pt has been advised of the information and verbalized understanding.

## 2021-01-28 NOTE — Telephone Encounter (Signed)
Left message on machine to call back  

## 2021-01-28 NOTE — Telephone Encounter (Signed)
Inbound call from patient he is in severe pain.  He is scheduled 4/14//22 but states cannot wait that long and is wanting to know if he can be seen sooner.  Please advise.

## 2021-01-28 NOTE — Telephone Encounter (Signed)
Patient is retuning your call

## 2021-01-29 ENCOUNTER — Ambulatory Visit
Admission: RE | Admit: 2021-01-29 | Discharge: 2021-01-29 | Disposition: A | Discharge status: Home / Self Care | Payer: Medicare HMO | Source: Ambulatory Visit | Attending: Internal Medicine | Admitting: Internal Medicine

## 2021-01-29 DIAGNOSIS — K824 Cholesterolosis of gallbladder: Secondary | ICD-10-CM | POA: Diagnosis not present

## 2021-01-29 DIAGNOSIS — K7689 Other specified diseases of liver: Secondary | ICD-10-CM | POA: Diagnosis not present

## 2021-01-29 DIAGNOSIS — R1011 Right upper quadrant pain: Secondary | ICD-10-CM

## 2021-01-29 DIAGNOSIS — R110 Nausea: Secondary | ICD-10-CM

## 2021-02-04 DIAGNOSIS — R350 Frequency of micturition: Secondary | ICD-10-CM | POA: Diagnosis not present

## 2021-02-04 DIAGNOSIS — N451 Epididymitis: Secondary | ICD-10-CM | POA: Diagnosis not present

## 2021-02-05 ENCOUNTER — Other Ambulatory Visit: Payer: Self-pay

## 2021-02-05 ENCOUNTER — Ambulatory Visit (HOSPITAL_COMMUNITY)
Admission: RE | Admit: 2021-02-05 | Discharge: 2021-02-05 | Disposition: A | Discharge status: Home / Self Care | Payer: Medicare HMO | Source: Ambulatory Visit | Attending: Internal Medicine | Admitting: Internal Medicine

## 2021-02-05 DIAGNOSIS — R109 Unspecified abdominal pain: Secondary | ICD-10-CM | POA: Diagnosis not present

## 2021-02-05 DIAGNOSIS — R1011 Right upper quadrant pain: Secondary | ICD-10-CM | POA: Diagnosis not present

## 2021-02-05 DIAGNOSIS — R112 Nausea with vomiting, unspecified: Secondary | ICD-10-CM | POA: Diagnosis not present

## 2021-02-05 MED ORDER — TECHNETIUM TC 99M MEBROFENIN IV KIT
5.0000 | PACK | Freq: Once | INTRAVENOUS | Status: AC | PRN
  Administered 2021-02-05: 5 via INTRAVENOUS

## 2021-02-07 ENCOUNTER — Other Ambulatory Visit: Payer: Self-pay | Admitting: Neurology

## 2021-02-07 ENCOUNTER — Encounter: Payer: Self-pay | Admitting: Neurology

## 2021-02-07 ENCOUNTER — Telehealth: Payer: Self-pay | Admitting: *Deleted

## 2021-02-07 ENCOUNTER — Ambulatory Visit: Payer: Medicare HMO | Admitting: Neurology

## 2021-02-07 VITALS — BP 155/83 | HR 98 | Ht 71.0 in | Wt 209.0 lb

## 2021-02-07 DIAGNOSIS — R109 Unspecified abdominal pain: Secondary | ICD-10-CM | POA: Diagnosis not present

## 2021-02-07 DIAGNOSIS — K4090 Unilateral inguinal hernia, without obstruction or gangrene, not specified as recurrent: Secondary | ICD-10-CM | POA: Diagnosis not present

## 2021-02-07 DIAGNOSIS — M792 Neuralgia and neuritis, unspecified: Secondary | ICD-10-CM

## 2021-02-07 DIAGNOSIS — Z8673 Personal history of transient ischemic attack (TIA), and cerebral infarction without residual deficits: Secondary | ICD-10-CM | POA: Diagnosis not present

## 2021-02-07 MED ORDER — LIDO-CAPSAICIN-MEN-METHYL SAL 0.5-0.035-5-20 % EX PTCH: 1.0000 | MEDICATED_PATCH | CUTANEOUS | 1 refills | Status: DC

## 2021-02-07 MED ORDER — ASPIRIN EC 81 MG PO TBEC: 81.0000 mg | DELAYED_RELEASE_TABLET | Freq: Every day | ORAL | 11 refills | Status: DC

## 2021-02-07 MED ORDER — PREGABALIN 50 MG PO CAPS: 50.0000 mg | ORAL_CAPSULE | Freq: Three times a day (TID) | ORAL | 2 refills | Status: DC

## 2021-02-07 NOTE — Patient Instructions (Addendum)
I had a long discussion the patient with his new complaints of right thoracoabdominal neuropathic pain and x-ray further evaluation by checking MRI scan of the thoracic spine to rule out any compressive radiculopathy and also a trial of lidocaine patch locally as well as Lyrica 50 mg twice daily to help with neuropathic pain.  Discontinue gabapentin.  Also recommend he start taking aspirin 81 mg daily for secondary stroke prevention as well as maintain aggressive risk factor modification with strict control of hypertension with blood pressure goal below 130/90, lipids with LDL cholesterol goal below 70 mg percent and diabetes with hemoglobin A1c goal below 6.5%.  Return for follow-up in the future in 3 months or call earlier if necessary.  Neuropathic Pain Neuropathic pain is pain caused by damage to the nerves that are responsible for certain sensations in your body (sensory nerves). The pain can be caused by:  Damage to the sensory nerves that send signals to your spinal cord and brain (peripheral nervous system).  Damage to the sensory nerves in your brain or spinal cord (central nervous system). Neuropathic pain can make you more sensitive to pain. Even a minor sensation can feel very painful. This is usually a long-term condition that can be difficult to treat. The type of pain differs from person to person. It may:  Start suddenly (acute), or it may develop slowly and last for a long time (chronic).  Come and go as damaged nerves heal, or it may stay at the same level for years.  Cause emotional distress, loss of sleep, and a lower quality of life. What are the causes? The most common cause of this condition is diabetes. Many other diseases and conditions can also cause neuropathic pain. Causes of neuropathic pain can be classified as:  Toxic. This is caused by medicines and chemicals. The most common cause of toxic neuropathic pain is damage from cancer treatments  (chemotherapy).  Metabolic. This can be caused by: ? Diabetes. This is the most common disease that damages the nerves. ? Lack of vitamin B from long-term alcohol abuse.  Traumatic. Any injury that cuts, crushes, or stretches a nerve can cause damage and pain. A common example is feeling pain after losing an arm or leg (phantom limb pain).  Compression-related. If a sensory nerve gets trapped or compressed for a long period of time, the blood supply to the nerve can be cut off.  Vascular. Many blood vessel diseases can cause neuropathic pain by decreasing blood supply and oxygen to nerves.  Autoimmune. This type of pain results from diseases in which the body's defense system (immune system) mistakenly attacks sensory nerves. Examples of autoimmune diseases that can cause neuropathic pain include lupus and multiple sclerosis.  Infectious. Many types of viral infections can damage sensory nerves and cause pain. Shingles infection is a common cause of this type of pain.  Inherited. Neuropathic pain can be a symptom of many diseases that are passed down through families (genetic). What increases the risk? You are more likely to develop this condition if:  You have diabetes.  You smoke.  You drink too much alcohol.  You are taking certain medicines, including medicines that kill cancer cells (chemotherapy) or that treat immune system disorders. What are the signs or symptoms? The main symptom is pain. Neuropathic pain is often described as:  Burning.  Shock-like.  Stinging.  Hot or cold.  Itching. How is this diagnosed? No single test can diagnose neuropathic pain. It is diagnosed based on:  Physical exam and your symptoms. Your health care provider will ask you about your pain. You may be asked to use a pain scale to describe how bad your pain is.  Tests. These may be done to see if you have a high sensitivity to pain and to help find the cause and location of any sensory  nerve damage. They include: ? Nerve conduction studies to test how well nerve signals travel through your sensory nerves (electrodiagnostic testing). ? Stimulating your sensory nerves through electrodes on your skin and measuring the response in your spinal cord and brain (somatosensory evoked potential).  Imaging studies, such as: ? X-rays. ? CT scan. ? MRI. How is this treated? Treatment for neuropathic pain may change over time. You may need to try different treatment options or a combination of treatments. Some options include:  Treating the underlying cause of the neuropathy, such as diabetes, kidney disease, or vitamin deficiencies.  Stopping medicines that can cause neuropathy, such as chemotherapy.  Medicine to relieve pain. Medicines may include: ? Prescription or over-the-counter pain medicine. ? Anti-seizure medicine. ? Antidepressant medicines. ? Pain-relieving patches that are applied to painful areas of skin. ? A medicine to numb the area (local anesthetic), which can be injected as a nerve block.  Transcutaneous nerve stimulation. This uses electrical currents to block painful nerve signals. The treatment is painless.  Alternative treatments, such as: ? Acupuncture. ? Meditation. ? Massage. ? Physical therapy. ? Pain management programs. ? Counseling. Follow these instructions at home: Medicines  Take over-the-counter and prescription medicines only as told by your health care provider.  Do not drive or use heavy machinery while taking prescription pain medicine.  If you are taking prescription pain medicine, take actions to prevent or treat constipation. Your health care provider may recommend that you: ? Drink enough fluid to keep your urine pale yellow. ? Eat foods that are high in fiber, such as fresh fruits and vegetables, whole grains, and beans. ? Limit foods that are high in fat and processed sugars, such as fried or sweet foods. ? Take an  over-the-counter or prescription medicine for constipation.   Lifestyle  Have a good support system at home.  Consider joining a chronic pain support group.  Do not use any products that contain nicotine or tobacco, such as cigarettes and e-cigarettes. If you need help quitting, ask your health care provider.  Do not drink alcohol.   General instructions  Learn as much as you can about your condition.  Work closely with all your health care providers to find the treatment plan that works best for you.  Ask your health care provider what activities are safe for you.  Keep all follow-up visits as told by your health care provider. This is important. Contact a health care provider if:  Your pain treatments are not working.  You are having side effects from your medicines.  You are struggling with tiredness (fatigue), mood changes, depression, or anxiety. Summary  Neuropathic pain is pain caused by damage to the nerves that are responsible for certain sensations in your body (sensory nerves).  Neuropathic pain may come and go as damaged nerves heal, or it may stay at the same level for years.  Neuropathic pain is usually a long-term condition that can be difficult to treat. Consider joining a chronic pain support group. This information is not intended to replace advice given to you by your health care provider. Make sure you discuss any questions you have with your  health care provider. Document Revised: 02/24/2019 Document Reviewed: 11/20/2017 Elsevier Patient Education  2021 [employer].

## 2021-02-07 NOTE — Progress Notes (Signed)
[facility] [address] [place]. [place] [address] [phone]       OFFICE FOLLOW-UP NOTE  [name] Date of Birth:  04/06/1951 Medical Record Number:  [id number]   HPI: He returns for follow-up today after last video visit on 11/29/2020 with Thomas [name] nurse practitioner Thomas [name] is a 70 year old Caucasian male with left thalamic lacunar infarct on 03/22/2020 with residual post stroke paresthesias in right hemibody.  He has vascular risk factors of hypertension, hyperlipidemia, age, former tobacco and alcohol use.  He has had residual hypersensitivity on the right side.  He now has a new complaint of right thoracoabdominal pain that he has had for several weeks.  Describes this pain as starting just below the rib cage and going up around the rib margin.  This at times is dull aching at times it sharp and shooting.  He was seen in the ER on 01/14/2021 for these complaints and it was felt that the pain was related to reflux symptoms from gastritis/GERD.  And underwent abdominal and pelvic CT scan which showed no acute abnormality.  Subsequently had a gallbladder scan which was also negative.  Is also seen a urologist did not think it was a kidney stone.  He was found to have small groin hernia but the surgeon did not feel his symptoms can be explained by the hernia.  He now complains of constant ache in just below his rib cage in the abdomen but at times it shoots up and down.  He takes tramadol which helps but does not get rid of the pain.  He does take gabapentin 300 mg at night for his postop paresthesias but he has not tried taking it during the day.  He denies any vesicular rash or itching on his back.  He has no prior history of shingles.  He has not tried medications like Topamax or Lyrica.  He is quite frustrated with lack of explanation for his pain and would like me to do neurological evaluation for the same.  He is doing well from stroke standpoint without recurrent  stroke or TIA symptoms.  He has stopped taking aspirin as he was taking other medications for pain.  His blood pressure is usually well controlled today it is slightly elevated 155/83.  ROS:   14 system review of systems is positive for numbness, tingling, back and abdominal pain and all other systems negative  PMH:  Past Medical History:  Diagnosis Date  . Acute ischemic stroke ([place]) 03/22/2020  . Avulsed toenail, initial encounter 05/04/2018  . Chronic gout    multiple sites--- followed by pcp--- (04-14-2018  per pt last flare-up , march 2019)  . Dyspnea on exertion 02/10/2020  . ED (erectile dysfunction)   . Epidermoid cyst of neck 10/16/2017  . Essential hypertension 07/21/2017  . Gout 01/19/2014  . Gout of multiple sites 07/21/2017  . History of kidney stones   . History of squamous cell carcinoma excision   . Hyperlipidemia   . Hypertension   . Hypertension not at goal 01/19/2014  . It band syndrome, left 03/19/2020  . 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 Left ureteral calculus   . Lower abdominal pain 08/27/2015  . Metatarsalgia of both feet 03/19/2020  . Multiple joint pain 07/16/2018  . Pain in joint, shoulder region 12/11/2015  . Preventative health care 11/14/2016  . Right sided abdominal pain 01/14/2021  . 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 Urgency of urination   .  URI (upper respiratory infection) 11/08/2014  . Wears glasses     Social History:  Social History   Socioeconomic History  . Marital status: Married    Spouse name: Thomas [name]  . Number of children: Not on file  . Years of education: Not on file  . Highest education level: Not on file  Occupational History  . Occupation: Pharmacist, hospital  Tobacco Use  . Smoking status: Former Smoker    Packs/day: 0.30    Years: 20.00    Pack years: 6.00    Types: Cigarettes    Quit date: 10/17/1994    Years since quitting: 26.3  . Smokeless tobacco: Never Used  Vaping Use  . Vaping Use: Not on  file  Substance and Sexual Activity  . Alcohol use: Yes    Alcohol/week: 2.0 standard drinks    Types: 2 Glasses of wine per week    Comment: rare  . Drug use: No  . Sexual activity: Yes  Other Topics Concern  . Not on file  Social History Narrative   Exercise 1 hour a day ---3-4 days a week   Social Determinants of Health   Financial Resource Strain: Not on file  Food Insecurity: Not on file  Transportation Needs: Not on file  Physical Activity: Not on file  Stress: Not on file  Social Connections: Not on file  Intimate Partner Violence: Not on file    Medications:   Current Outpatient Medications on File Prior to Visit  Medication Sig Dispense Refill  . allopurinol (ZYLOPRIM) 300 MG tablet Take 300 mg by mouth daily.    Marland Kitchen b complex vitamins tablet Take 1 tablet by mouth daily.    . Calcium-Magnesium-Vitamin D (CALCIUM MAGNESIUM PO) Take 1 tablet by mouth daily.    . colchicine 0.6 MG tablet Take 0.6 mg by mouth as needed (gout flare up).    . hydrochlorothiazide (HYDRODIURIL) 25 MG tablet TAKE 1 TABLET BY MOUTH EVERY DAY 90 tablet 0  . minoxidil (LONITEN) 2.5 MG tablet Take 3 tablets in the morning. Take 2 tablets at night. 150 tablet 6  . ondansetron (ZOFRAN-ODT) 4 MG disintegrating tablet Take 4 mg by mouth every 8 (eight) hours.    . sildenafil (VIAGRA) 100 MG tablet Take 1 tablet (100 mg total) by mouth daily as needed for erectile dysfunction. 15 tablet 3  . traMADol (ULTRAM) 50 MG tablet Take 50 mg by mouth 2 (two) times daily as needed for severe pain or moderate pain.    . valsartan (DIOVAN) 320 MG tablet Take 1 tablet (320 mg total) by mouth daily. 90 tablet 0  . tamsulosin (FLOMAX) 0.4 MG CAPS capsule Take 0.4 mg by mouth daily. 02/07/21 not taking yet (Patient not taking: Reported on 02/07/2021)     No current facility-administered medications on file prior to visit.    Allergies:  No Active Allergies  Physical Exam General: well developed, well nourished  middle-aged Caucasian male, seated, in no evident distress Head: head normocephalic and atraumatic.  Neck: supple with no carotid or supraclavicular bruits Cardiovascular: regular rate and rhythm, no murmurs Musculoskeletal: no deformity Skin:  no rash/petichiae macular rash in the right thoracolumbar paraspinal region.  No vesicles noted. Vascular:  Normal pulses all extremities Vitals:   02/07/21 1344  BP: (!) 155/83  Pulse: 98   Neurologic Exam Mental Status: Awake and fully alert. Oriented to place and time. Recent and remote memory intact. Attention span, concentration and fund of knowledge appropriate. [name] and affect appropriate.  Cranial Nerves: Fundoscopic exam reveals sharp disc margins. Pupils equal, briskly reactive to light. Extraocular movements full without nystagmus. Visual fields full to confrontation. Hearing intact. Facial sensation intact. Face, tongue, palate moves normally and symmetrically.  Motor: Normal bulk and tone. Normal strength in all tested extremity muscles. Sensory.: intact to touch ,pinprick .position and vibratory sensation.  Subjective paresthesias right upper and lower extremity but no objective sensory loss Coordination: Rapid alternating movements normal in all extremities. Finger-to-nose and heel-to-shin performed accurately bilaterally. Gait and Station: Arises from chair without difficulty. Stance is normal. Gait demonstrates normal stride length and balance . Able to heel, toe and tandem walk without difficulty.  Reflexes: 1+ and symmetric. Toes downgoing.   NIHSS  1 Modified Rankin  1   ASSESSMENT: 70 year old Caucasian male with right thoracoabdominal radicular pain of unclear etiology.  Possibilities include thoracic radiculopathy versus post herpetic neuralgia.  History of left thalamic lacunar infarct in May 2021 with residual mild post stroke paresthesias.  Vascular risk factors of hypertension     PLAN: I had a long discussion the  patient with his new complaints of right thoracoabdominal neuropathic pain and x-ray further evaluation by checking MRI scan of the thoracic spine to rule out any compressive radiculopathy and also a trial of lidocaine patch locally as well as Lyrica 50 mg twice daily to help with neuropathic pain.  Discontinue gabapentin.  Also recommend he start taking aspirin 81 mg daily for secondary stroke prevention as well as maintain aggressive risk factor modification with strict control of hypertension with blood pressure goal below 130/90, lipids with LDL cholesterol goal below 70 mg percent and diabetes with hemoglobin A1c goal below 6.5%.  Return for follow-up in the future in 3 months or call earlier if necessary. Greater than 50% of time during this prolonged 45  minute visit was spent on counseling,explanation of diagnosis, planning of further management, discussion with patient and family and coordination of care Antony Contras, MD Note: This document was prepared with digital dictation and possible smart phrase technology. Any transcriptional errors that result from this process are unintentional

## 2021-02-07 NOTE — Telephone Encounter (Signed)
Lyrica 50 mg PA, key [id number], M79.2 Your information has been submitted to [place] Medicare Part D. [employer] Part D will review the request and will issue a decision, typically within 1-3 days from your submission.

## 2021-02-11 ENCOUNTER — Encounter: Payer: Self-pay | Admitting: *Deleted

## 2021-02-11 ENCOUNTER — Other Ambulatory Visit: Payer: Self-pay | Admitting: Neurology

## 2021-02-11 ENCOUNTER — Telehealth: Payer: Self-pay | Admitting: Neurology

## 2021-02-11 MED ORDER — GABAPENTIN 300 MG PO CAPS: 300.0000 mg | ORAL_CAPSULE | Freq: Two times a day (BID) | ORAL | 11 refills | Status: DC

## 2021-02-11 MED ORDER — ALPRAZOLAM 0.5 MG PO TABS: 0.5000 mg | ORAL_TABLET | Freq: Once | ORAL | 0 refills | Status: AC

## 2021-02-11 NOTE — Telephone Encounter (Signed)
Ok to do alternative

## 2021-02-11 NOTE — Addendum Note (Signed)
Addended by: Minna Antis on: 02/11/2021 05:11 PM   Modules accepted: Orders

## 2021-02-11 NOTE — Addendum Note (Signed)
Addended by: Florian Buff C on: 02/11/2021 04:00 PM   Modules accepted: Orders

## 2021-02-11 NOTE — Telephone Encounter (Signed)
Patient called; he's  taking gabapentin 300 mg, taking one tablet twice a day. He will need a new Rx within 2 weeks.  He then stated his MRI is on 02/20/21, and  he is claustrophobic. He is asking for medication to help him.  I advised he must have a driver; his wife will drive. I advised will send to Dr Leonie Man for gabapentin Rx and Rx for med to take prior to MRI. Patient verbalized understanding, appreciation.

## 2021-02-11 NOTE — Telephone Encounter (Signed)
[employer] medicare order sent to [facility]. They will obtain the auth and reach out to the patient to schedule.  °

## 2021-02-11 NOTE — Telephone Encounter (Signed)
Okay agree with both request

## 2021-02-11 NOTE — Telephone Encounter (Addendum)
Per [facility]MM, pregablin, (Lyrica) denied:  Your plan does not allow coverage of this medication based on your prescriber answering No to the following question(s):  Is the requested drug being prescribed as adjunctive therapy for treatment of partial onset seizures? Is the requested drug being prescribed for the management of fibromyalgia or the management of neuropathic pain associated with spinal cord injury? Is the requested drug being prescribed for any of the following: A) Management of postherpetic neuralgia, B) Management of neuropathic pain associated with diabetic peripheral neuropathy, C) Cancer-related neuropathic pain,  D) Cancer treatment-related neuropathic pain. Called patient and informed him Good Rx prices from  $12.49-23.06 for 30 day supply. He stated that since pharmacy couldn't fill Rx due to insurance denial, he has doubled up on gabapentin. He stated it has not caused drowsiness and has worked well for him. He wll continue to do this for now.  Has asked about MRI; I advised he call [facility], gave him the #. Patient verbalized understanding, appreciation. Sent him my chart asking for Gabapentin details.

## 2021-02-13 ENCOUNTER — Telehealth: Payer: Self-pay | Admitting: *Deleted

## 2021-02-13 MED ORDER — LIDOCAINE 5 % EX OINT: 1.0000 "application " | TOPICAL_OINTMENT | CUTANEOUS | 1 refills | Status: DC | PRN

## 2021-02-13 NOTE — Addendum Note (Signed)
Addended by: Wyvonnia Lora on: 02/13/2021 08:10 AM   Modules accepted: Orders

## 2021-02-13 NOTE — Telephone Encounter (Signed)
Called pt. Advised patch that Dr. Leonie Man called in is 500.00. Alternative options: OTC lidocaine patch 4%. Pt already doing this. Advised it is difficult to get prescription strength lidocaine 5% patch approved. I spoke with Dr. Jannifer Franklin here since Dr. Leonie Man working in the hospital. Other option we can offer is lidocaine ointment 5%. He can cover with non-adherent dressing/gauze once he applies to keep medication in place and off of clothes. He would like to try this option. I e-scribed to [facility] on file.

## 2021-02-13 NOTE — Telephone Encounter (Signed)
[employer] approved lidocaine 5% ointment 11/17/20- 05/14/2021. Faxed approval letter to pharmacy.

## 2021-02-13 NOTE — Telephone Encounter (Signed)
PA for lidocaine 5% ointment, key: [id number]. Your information has been submitted to [place] Medicare Part D. [employer] Part D will review the request and will issue a decision, typically within 1-3 days from your submission.  If [employer] D has not responded in 1-3 days or if you have any questions about your ePA request, please contact [place] Medicare Part D at [phone].

## 2021-02-19 DIAGNOSIS — E663 Overweight: Secondary | ICD-10-CM | POA: Diagnosis not present

## 2021-02-19 DIAGNOSIS — M150 Primary generalized (osteo)arthritis: Secondary | ICD-10-CM | POA: Diagnosis not present

## 2021-02-19 DIAGNOSIS — M2550 Pain in unspecified joint: Secondary | ICD-10-CM | POA: Diagnosis not present

## 2021-02-19 DIAGNOSIS — M1A09X0 Idiopathic chronic gout, multiple sites, without tophus (tophi): Secondary | ICD-10-CM | POA: Diagnosis not present

## 2021-02-19 DIAGNOSIS — Z6829 Body mass index (BMI) 29.0-29.9, adult: Secondary | ICD-10-CM | POA: Diagnosis not present

## 2021-02-20 ENCOUNTER — Other Ambulatory Visit: Payer: Medicare HMO

## 2021-02-20 NOTE — Telephone Encounter (Signed)
[employer] medicare did not approve the MRI Thoracic spine.  Imaging requires six weeks of provider directed treatment to be completed. Supported treatments include (but are not limited to) drugs for swelling or pain, an office workout (physical therapy), and/or oral or injected steroids. This must have been completed in the past three months without improved symptoms. contact (via office visit, phone, email, or messaging) must occur after the treatment is completed. This has not been met because: you have not completed six weeks of provider directed treatment, the provider directed treatment did not occur  within the last three months. Symptoms must be the same or worse after treatment to support imaging. There was no contact with your provider after completing treatment.    There is no option to do a peer to peer. The only option is to appeal it. And it would need a written letter on the reason for why the exam is needed. If you would like to appeal it please write a letter on the reasoning for the exam and I will fax it to attn: appeals [place] medicare part C appeals to [phone].

## 2021-02-21 NOTE — Telephone Encounter (Signed)
Received call from patient stating his MRI was denied, wondered what next steps are. I messaged [facility] [employer]. She is waiting for reply from MD. I advised patient of this. Patient verbalized understanding, appreciation.

## 2021-02-25 NOTE — Telephone Encounter (Signed)
I do not believe an appeal is likely going to change their  decision and hence resubmit after 6 weeks

## 2021-02-26 NOTE — Telephone Encounter (Signed)
Do you want the patient to have six weeks of provider directed treatment to be completed. Supported treatments include (but are not limited to) drugs for swelling or pain, an office workout (physical therapy), and/or oral or injected steroids? That is what the insurance is looking for if the patient is going to do 6 week of treatment?

## 2021-02-28 ENCOUNTER — Ambulatory Visit: Payer: Medicare HMO | Admitting: Gastroenterology

## 2021-03-04 ENCOUNTER — Other Ambulatory Visit: Payer: Self-pay | Admitting: Cardiology

## 2021-03-04 NOTE — Telephone Encounter (Signed)
Valsartan 320 mg # 90 tablets x 3 refills sent to pharmacy

## 2021-03-12 DIAGNOSIS — M1A09X0 Idiopathic chronic gout, multiple sites, without tophus (tophi): Secondary | ICD-10-CM | POA: Diagnosis not present

## 2021-03-18 ENCOUNTER — Telehealth: Payer: Self-pay | Admitting: Neurology

## 2021-03-18 NOTE — Telephone Encounter (Signed)
Pt states initially the MRI was denied by his insurance, he was told he needed to wait 6 weeks.  Pt states it has been 6 weeks and he would like to now go ahead and schedule his MRI, please call.

## 2021-03-19 ENCOUNTER — Other Ambulatory Visit: Payer: Self-pay | Admitting: Neurology

## 2021-03-19 DIAGNOSIS — M5414 Radiculopathy, thoracic region: Secondary | ICD-10-CM

## 2021-03-19 NOTE — Telephone Encounter (Signed)
At 9:46 this morning pt left a voicemail asking for a call from [place].  Please call

## 2021-03-19 NOTE — Telephone Encounter (Signed)
Returned patient's call back and he said he has completed the 6 weeks of increased gabapentin and use of the lidocaine patch.  He would like to get his MRI done now please.    Information sent to Dr. Leonie Man.

## 2021-03-19 NOTE — Telephone Encounter (Signed)
Okay I have ordered again

## 2021-03-19 NOTE — Telephone Encounter (Signed)
This is the patient from 02/11/21 that I asked if Dr. Leonie Man wanted to do 6 weeks of treatments. I can't just start a new case because it has been 6 weeks. He either needs to do 6 weeks of treatment on an appeal needs to be done. Please look back from tele phone note from 02/11/21.

## 2021-03-20 ENCOUNTER — Other Ambulatory Visit: Payer: Self-pay

## 2021-03-20 ENCOUNTER — Ambulatory Visit (INDEPENDENT_AMBULATORY_CARE_PROVIDER_SITE_OTHER): Payer: Medicare HMO | Admitting: Family Medicine

## 2021-03-20 ENCOUNTER — Encounter: Payer: Self-pay | Admitting: Emergency Medicine

## 2021-03-20 VITALS — BP 162/64 | Ht 70.0 in | Wt 210.0 lb

## 2021-03-20 DIAGNOSIS — R109 Unspecified abdominal pain: Secondary | ICD-10-CM | POA: Diagnosis not present

## 2021-03-20 DIAGNOSIS — M549 Dorsalgia, unspecified: Secondary | ICD-10-CM

## 2021-03-20 MED ORDER — METHYLPREDNISOLONE ACETATE 80 MG/ML IJ SUSP
80.0000 mg | Freq: Once | INTRAMUSCULAR | Status: AC
  Administered 2021-03-20: 80 mg via INTRAMUSCULAR

## 2021-03-20 MED ORDER — KETOROLAC TROMETHAMINE 60 MG/2ML IM SOLN
60.0000 mg | Freq: Once | INTRAMUSCULAR | Status: AC
  Administered 2021-03-20: 60 mg via INTRAMUSCULAR

## 2021-03-20 NOTE — Patient Instructions (Signed)
The thoracic MRI is under appeal now and hopefully will be approved. Take the tramadol twice a day - the second time you may have to take around 3pm. Take tylenol with the tramadol. Meloxicam 7.5mg  daily with food for pain and inflammation. Gabapentin 300mg  - increase to 3 times a day. Follow up with me in a couple weeks (ok to message me on mychart) at the latest to let me know how you're doing.

## 2021-03-20 NOTE — Telephone Encounter (Signed)
His insurance will not let me start a new case because a denial has already been on file. They state an appeal needs to be done. I would need a letter stating that the patient complete 6 weeks of treatment. So I can fax it along with all the clinical notes. They state everything needs to be faxed within 24 hours.

## 2021-03-20 NOTE — Telephone Encounter (Signed)
Received letter fax it to the appeal department at [phone]. Appeal ph # [phone].

## 2021-03-21 ENCOUNTER — Encounter: Payer: Self-pay | Admitting: Family Medicine

## 2021-03-21 NOTE — Telephone Encounter (Signed)
[employer] appeal team called and left me a voicemail on my phone stating that they have received the fax and it is in review and to allow 72 hours to hear back.

## 2021-03-21 NOTE — Progress Notes (Addendum)
PCP: Michael Boston, MD  Subjective:   HPI: Patient is a 70 y.o. male here for med check.  Patient returns today having had extensive workup to date for his right low thoracic/side pain. After last visit here 3/28 sent to ED and had CT of abdomen and pelvis with contrast - no cause for his pain identified. Had RUQ ultrasound and HIDA scan in March which showed hepatic steatosis, small gallbladder polyp but not felt to be related to his current pain. Saw his urologist and a general surgeon - discussion about him having a small hernia but also felt to be unrelated to his pain which is much more cephalad to this. He's currently waiting on appeal for thoracic spine MRI ordered by neurology. Pain has worsened over past 7-10 days as well though has been present for several weeks now. He's taking gabapentin 300mg  bid with lidocaine patches. Takes tramadol with 2 tylenol in the morning. If he takes the tramadol after 4pm then it keeps him up at night.  Past Medical History:  Diagnosis Date  . Acute ischemic stroke ([place]) 03/22/2020  . Avulsed toenail, initial encounter 05/04/2018  . Chronic gout    multiple sites--- followed by pcp--- (04-14-2018  per pt last flare-up , march 2019)  . Dyspnea on exertion 02/10/2020  . ED (erectile dysfunction)   . Epidermoid cyst of neck 10/16/2017  . Essential hypertension 07/21/2017  . Gout 01/19/2014  . Gout of multiple sites 07/21/2017  . History of kidney stones   . History of squamous cell carcinoma excision   . Hyperlipidemia   . Hypertension   . Hypertension not at goal 01/19/2014  . It band syndrome, left 03/19/2020  . 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 Left ureteral calculus   . Lower abdominal pain 08/27/2015  . Metatarsalgia of both feet 03/19/2020  . Multiple joint pain 07/16/2018  . Pain in joint, shoulder region 12/11/2015  . Preventative health care 11/14/2016  . Right sided abdominal pain 01/14/2021  . Superior mesenteric artery  stenosis ([place]) 02/24/2020  . Urgency of urination   . URI (upper respiratory infection) 11/08/2014  . Wears glasses     Current Outpatient Medications on File Prior to Visit  Medication Sig Dispense Refill  . allopurinol (ZYLOPRIM) 300 MG tablet Take 300 mg by mouth daily.    Marland Kitchen aspirin EC 81 MG tablet Take 1 tablet (81 mg total) by mouth daily. Swallow whole. 30 tablet 11  . b complex vitamins tablet Take 1 tablet by mouth daily.    . Calcium-Magnesium-Vitamin D (CALCIUM MAGNESIUM PO) Take 1 tablet by mouth daily.    . colchicine 0.6 MG tablet Take 0.6 mg by mouth as needed (gout flare up).    . gabapentin (NEURONTIN) 300 MG capsule Take 1 capsule (300 mg total) by mouth 2 (two) times daily. 60 capsule 11  . hydrochlorothiazide (HYDRODIURIL) 25 MG tablet TAKE 1 TABLET BY MOUTH EVERY DAY 90 tablet 0  . lidocaine (XYLOCAINE) 5 % ointment Apply 1 application topically as needed. 35.44 g 1  . minoxidil (LONITEN) 2.5 MG tablet Take 3 tablets in the morning. Take 2 tablets at night. 150 tablet 6  . ondansetron (ZOFRAN-ODT) 4 MG disintegrating tablet Take 4 mg by mouth every 8 (eight) hours.    . sildenafil (VIAGRA) 100 MG tablet Take 1 tablet (100 mg total) by mouth daily as needed for erectile dysfunction. 15 tablet 3  . tamsulosin (FLOMAX) 0.4 MG CAPS capsule Take 0.4  mg by mouth daily. 02/07/21 not taking yet (Patient not taking: Reported on 02/07/2021)    . traMADol (ULTRAM) 50 MG tablet Take 50 mg by mouth 2 (two) times daily as needed for severe pain or moderate pain.    . valsartan (DIOVAN) 320 MG tablet TAKE 1 TABLET BY MOUTH EVERY DAY 90 tablet 3   No current facility-administered medications on file prior to visit.    Past Surgical History:  Procedure Laterality Date  . COLONOSCOPY WITH PROPOFOL  2017  . CYSTOSCOPY/RETROGRADE/URETEROSCOPY/STONE EXTRACTION WITH BASKET Left 04/16/2018   Procedure: CYSTOSCOPY/RETROGRADE/URETEROSCOPY/STONE EXTRACTION WITH BASKET/ HOLMIUM LASER LITHOTRIPSY/  STENT PLACEMENT;  Surgeon: Ardis Hughs, MD;  Location: [facility];  Service: Urology;  Laterality: Left;  . HOLMIUM LASER APPLICATION Left 8/34/1962   Procedure: HOLMIUM LASER APPLICATION;  Surgeon: Ardis Hughs, MD;  Location: [facility];  Service: Urology;  Laterality: Left;    No Active Allergies  Social History   Socioeconomic History  . Marital status: Married    Spouse name: [name]  . Number of children: Not on file  . Years of education: Not on file  . Highest education level: Not on file  Occupational History  . Occupation: Pharmacist, hospital  Tobacco Use  . Smoking status: Former Smoker    Packs/day: 0.30    Years: 20.00    Pack years: 6.00    Types: Cigarettes    Quit date: 10/17/1994    Years since quitting: 26.4  . Smokeless tobacco: Never Used  Vaping Use  . Vaping Use: Not on file  Substance and Sexual Activity  . Alcohol use: Yes    Alcohol/week: 2.0 standard drinks    Types: 2 Glasses of wine per week    Comment: rare  . Drug use: No  . Sexual activity: Yes  Other Topics Concern  . Not on file  Social History Narrative   Exercise 1 hour a day ---3-4 days a week   Social Determinants of Health   Financial Resource Strain: Not on file  Food Insecurity: Not on file  Transportation Needs: Not on file  Physical Activity: Not on file  Stress: Not on file  Social Connections: Not on file  Intimate Partner Violence: Not on file    Family History  Problem Relation Age of Onset  . Heart disease Mother 43  . Alcohol abuse Mother   . Depression Mother   . Cancer Sister        hodgkins, breast  . Hypertension Father   . Colon cancer Neg Hx   . Pancreatic cancer Neg Hx   . Rectal cancer Neg Hx   . Stomach cancer Neg Hx     BP (!) 162/64   Ht 5\' 10"  (1.778 m)   Wt 210 lb (95.3 kg)   BMI 30.13 kg/m   [facility] Adult Exercise 07/30/2020 09/10/2020  Frequency of aerobic exercise (# of days/week) 5 5   Average time in minutes 60 60  Frequency of strengthening activities (# of days/week) 0 0    No flowsheet data found.  Review of Systems: See HPI above.     Objective:  Physical Exam:  Gen: NAD, comfortable in exam room  Abd: soft, tender right abdominal wall.  No HSM.  No rebound or guarding.  Back: No rash, deformity, swelling. TTP over lateral aspect of ribs 11, 12 around to right abdominal wall.  No other tenderness. No pain with trunk rotation, flexion, extension.   Assessment &  Plan:  1. Mid-low back pain radiating into abdomen.  Concerning for thoracic radiculopathy with MRI currently in appeal.  Discussed management of his pain - increase gabapentin to 300mg  bid.  Tylenol with tramadol twice a day though second dose around 3pm so it hopefully doesn't keep him up at night.  Meloxicam 7.5mg  daily with food.  Topical lidocaine patches.  Given IM depomedrol 80mg  and toradol 60mg  today as well.  F/u in about 2 weeks.

## 2021-03-25 NOTE — Telephone Encounter (Signed)
[employer] medicare appeal auth: [id number] (exp. 03/21/21 to 09/21/21) order sent to [facility]. They will reach out to the patient to schedule.

## 2021-03-26 ENCOUNTER — Other Ambulatory Visit: Payer: Self-pay | Admitting: Neurology

## 2021-03-26 DIAGNOSIS — M5414 Radiculopathy, thoracic region: Secondary | ICD-10-CM

## 2021-03-27 ENCOUNTER — Encounter: Payer: Self-pay | Admitting: Gastroenterology

## 2021-03-27 ENCOUNTER — Ambulatory Visit
Admission: RE | Admit: 2021-03-27 | Discharge: 2021-03-27 | Disposition: A | Discharge status: Home / Self Care | Payer: Medicare HMO | Source: Ambulatory Visit | Attending: Neurology | Admitting: Neurology

## 2021-03-27 ENCOUNTER — Ambulatory Visit: Payer: Medicare HMO | Admitting: Gastroenterology

## 2021-03-27 ENCOUNTER — Other Ambulatory Visit: Payer: Self-pay

## 2021-03-27 VITALS — BP 160/80 | HR 94 | Ht 70.0 in | Wt 213.0 lb

## 2021-03-27 DIAGNOSIS — R109 Unspecified abdominal pain: Secondary | ICD-10-CM

## 2021-03-27 DIAGNOSIS — M5414 Radiculopathy, thoracic region: Secondary | ICD-10-CM

## 2021-03-27 DIAGNOSIS — R1310 Dysphagia, unspecified: Secondary | ICD-10-CM

## 2021-03-27 MED ORDER — GADOBENATE DIMEGLUMINE 529 MG/ML IV SOLN
20.0000 mL | Freq: Once | INTRAVENOUS | Status: AC | PRN
  Administered 2021-03-27: 20 mL via INTRAVENOUS

## 2021-03-27 NOTE — Progress Notes (Signed)
[facility] VISIT   Primary Care Provider Michael Boston, MD [place] [address] [phone]  Referring Provider Michael Boston, [place],  [place] [address] [phone]  Patient Profile: Thomas [name] is a 70 y.o. male with a pmh significant for CVA, hypertension, hyperlipidemia, gout, superior mesenteric artery syndrome, nephrolithiasis, SCC (skin), diverticulosis.  The patient presents to the [facility] for an evaluation and management of problem(s) noted below:  Problem List 1. Abdominal wall pain   2. Right flank pain   3. Dysphagia, unspecified type     History of Present Illness This is the patient's first visit to the outpatient [facility] clinic in years.  He previously underwent a colonoscopy in 2015 by Dr. Deatra Ina who found diverticulosis and recommended a 10-year follow-up.  Patient over the course of the last few months has been experiencing right sided back/flank/abdominal discomfort.  He initially underwent imaging and laboratories.  The imaging and laboratories included ultrasound as well as CT scans.  No overt cause for symptoms was found.  A HIDA was performed which showed a low normal ejection fraction but no overt biliary dyskinesia.  Patient's symptoms occur with motion/rotation.  Food intake or fasting does not worsen his discomfort.  The pain is constant in nature and has been for weeks.  He has began to experience some increased issues with some discomfort and pains in his leg and back.  He is been evaluated by his sports medicine physician as well as neurology.  He has an MRI coming up later this week of his back to further evaluate if there are issues from a neuropathy perspective.  He has had some new onset dysphagia symptoms that have occurred at times to solid foods rather than liquids.  He is not losing weight unintentionally.  GERD symptoms are infrequent.  He has never had an upper  endoscopy.  GI Review of Systems Positive as above Negative for odynophagia, nausea, vomiting, bloating, change in bowel habits, melena, hematochezia  Review of Systems General: Denies fevers/chills HEENT: Denies oral lesions Cardiovascular: Denies chest pain/palpitations Pulmonary: Denies shortness of breath Gastroenterological: See HPI Genitourinary: Denies darkened urine or hematuria Hematological: Denies easy bruising/bleeding Endocrine: Denies temperature intolerance Dermatological: Denies jaundice Psychological: [name] is stable   Medications Current Outpatient Medications  Medication Sig Dispense Refill  . allopurinol (ZYLOPRIM) 300 MG tablet Take 450 mg by mouth daily.    Marland Kitchen aspirin EC 81 MG tablet Take 1 tablet (81 mg total) by mouth daily. Swallow whole. 30 tablet 11  . b complex vitamins tablet Take 1 tablet by mouth daily.    . Calcium-Magnesium-Vitamin D (CALCIUM MAGNESIUM PO) Take 1 tablet by mouth daily.    . colchicine 0.6 MG tablet Take 0.6 mg by mouth as needed (gout flare up).    . gabapentin (NEURONTIN) 300 MG capsule Take 1 capsule (300 mg total) by mouth 2 (two) times daily. 60 capsule 11  . hydrochlorothiazide (HYDRODIURIL) 25 MG tablet TAKE 1 TABLET BY MOUTH EVERY DAY 90 tablet 0  . lidocaine (XYLOCAINE) 5 % ointment Apply 1 application topically as needed. 35.44 g 1  . minoxidil (LONITEN) 2.5 MG tablet Take 3 tablets in the morning. Take 2 tablets at night. 150 tablet 6  . ondansetron (ZOFRAN-ODT) 4 MG disintegrating tablet Take 4 mg by mouth every 8 (eight) hours.    . sildenafil (VIAGRA) 100 MG tablet Take 1 tablet (100 mg total) by mouth daily as needed  for erectile dysfunction. 15 tablet 3  . traMADol (ULTRAM) 50 MG tablet Take 50 mg by mouth 2 (two) times daily as needed for severe pain or moderate pain.    . valsartan (DIOVAN) 320 MG tablet TAKE 1 TABLET BY MOUTH EVERY DAY 90 tablet 3   No current facility-administered medications for this visit.     Allergies Allergies  Allergen Reactions  . Statins     Jaw tightness and severe muscle pain- Pravastatin     Histories Past Medical History:  Diagnosis Date  . Acute ischemic stroke ([place]) 03/22/2020  . Avulsed toenail, initial encounter 05/04/2018  . Chronic gout    multiple sites--- followed by pcp--- (04-14-2018  per pt last flare-up , march 2019)  . Dyspnea on exertion 02/10/2020  . ED (erectile dysfunction)   . Epidermoid cyst of neck 10/16/2017  . Essential hypertension 07/21/2017  . Gout 01/19/2014  . Gout of multiple sites 07/21/2017  . History of kidney stones   . History of squamous cell carcinoma excision   . Hyperlipidemia   . Hypertension   . Hypertension not at goal 01/19/2014  . It band syndrome, left 03/19/2020  . 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 Left ureteral calculus   . Lower abdominal pain 08/27/2015  . Metatarsalgia of both feet 03/19/2020  . Multiple joint pain 07/16/2018  . Pain in joint, shoulder region 12/11/2015  . Preventative health care 11/14/2016  . Right sided abdominal pain 01/14/2021  . Superior mesenteric artery stenosis ([place]) 02/24/2020  . Urgency of urination   . URI (upper respiratory infection) 11/08/2014  . Wears glasses    Past Surgical History:  Procedure Laterality Date  . COLONOSCOPY WITH PROPOFOL  2017  . CYSTOSCOPY/RETROGRADE/URETEROSCOPY/STONE EXTRACTION WITH BASKET Left 04/16/2018   Procedure: CYSTOSCOPY/RETROGRADE/URETEROSCOPY/STONE EXTRACTION WITH BASKET/ HOLMIUM LASER LITHOTRIPSY/ STENT PLACEMENT;  Surgeon: Ardis Hughs, MD;  Location: [facility];  Service: Urology;  Laterality: Left;  . HOLMIUM LASER APPLICATION Left 2/69/4854   Procedure: HOLMIUM LASER APPLICATION;  Surgeon: Ardis Hughs, MD;  Location: [facility];  Service: Urology;  Laterality: Left;   Social History   Socioeconomic History  . Marital status: Married    Spouse name: Thomas [name]  . Number of  children: 2  . Years of education: Not on file  . Highest education level: Not on file  Occupational History  . Occupation: Pharmacist, hospital  Tobacco Use  . Smoking status: Former Smoker    Packs/day: 0.30    Years: 20.00    Pack years: 6.00    Types: Cigarettes    Quit date: 10/17/1994    Years since quitting: 26.4  . Smokeless tobacco: Never Used  Vaping Use  . Vaping Use: Not on file  Substance and Sexual Activity  . Alcohol use: Yes    Alcohol/week: 2.0 standard drinks    Types: 2 Glasses of wine per week    Comment: rare  . Drug use: No  . Sexual activity: Yes  Other Topics Concern  . Not on file  Social History Narrative   Exercise 1 hour a day ---3-4 days a week   Social Determinants of Health   Financial Resource Strain: Not on file  Food Insecurity: Not on file  Transportation Needs: Not on file  Physical Activity: Not on file  Stress: Not on file  Social Connections: Not on file  Intimate Partner Violence: Not on file   Family History  Problem Relation Age of Onset  .  Heart disease Mother 70  . Alcohol abuse Mother   . Depression Mother   . Cancer Sister        hodgkins, breast  . Hypertension Father   . Colon cancer Neg Hx   . Pancreatic cancer Neg Hx   . Rectal cancer Neg Hx   . Stomach cancer Neg Hx   . Esophageal cancer Neg Hx   . Inflammatory bowel disease Neg Hx   . Liver disease Neg Hx    I have reviewed his medical, social, and family history in detail and updated the electronic medical record as necessary.    PHYSICAL EXAMINATION  BP (!) 160/80   Pulse 94   Ht 5\' 10"  (1.778 m)   Wt 213 lb (96.6 kg)   BMI 30.56 kg/m  Wt Readings from Last 3 Encounters:  03/27/21 213 lb (96.6 kg)  03/20/21 210 lb (95.3 kg)  02/07/21 209 lb (94.8 kg)  GEN: NAD, appears stated age, doesn't appear chronically ill PSYCH: Cooperative, without pressured speech EYE: Conjunctivae pink, sclerae anicteric ENT: Masked CV: Nontachycardic RESP: No audible  wheezing GI: NABS, soft, NT/ND, without rebound or guarding, no HSM appreciated MSK/EXT: Trace bilateral lower extremity edema SKIN: No jaundice NEURO:  Alert & Oriented x 3, no focal deficits   REVIEW OF DATA  I reviewed the following data at the time of this encounter:  GI Procedures and Studies  2015 colonoscopy ENDOSCOPIC IMPRESSION: 1. Moderate diverticulosis was noted in the descending colon and sigmoid colon 2. The examination was otherwise normal  Laboratory Studies  Reviewed those in epic  Imaging Studies  February 2022 CT abdomen pelvis with contrast IMPRESSION: 1. No acute findings to explain the patient's pain. Fair amount of stool in the ascending and transverse colon suggests constipation. 2. Hepatic steatosis. 3. Slightly prominent prostate. 4.  Aortic atherosclerosis (ICD10-I70.0).  March 2022 right upper quadrant ultrasound IMPRESSION: 1. 2 mm polyp within the gallbladder. Per consensus guidelines, a polyp of this size does not warrant additional imaging surveillance. No evident gallstones, gallbladder wall thickening, or pericholecystic fluid.  2. Diffuse increase in liver echogenicity consistent with hepatic steatosis. Mild fatty sparing near the gallbladder fossa. Cyst in the right lobe of liver anteriorly measuring 1.7 x 1.2 x 1.5 cm.  March 2022 HIDA IMPRESSION: Low normal gallbladder ejection fraction.   ASSESSMENT  Mr. Livsey is a 70 y.o. male with a pmh significant for CVA, hypertension, hyperlipidemia, gout, superior mesenteric artery syndrome, nephrolithiasis, SCC (skin), diverticulosis.  The patient is seen today for evaluation and management of:  1. Abdominal wall pain   2. Right flank pain   3. Dysphagia, unspecified type    The patient is hemodynamically stable.  The patient's chronic discomfort seems to be most likely an abdominal wall pain versus neuropathy related to his back.  From an intra-abdominal organ perspective this does  not seem to be GI related.  With this being said, consideration of repeat laboratory testing to see if liver tests or pancreas enzymes are abnormal can be considered, after discussion with the patient however he is okay with holding on this at this time.  He will undergo his MRI imaging and follow-up with his neurology and sports medicine provider.  Consideration of a trigger point injection with sports medicine or pain may be most beneficial.  Patient has had some new onset dysphagia symptoms for which an endoscopy is recommended based on his age.  The risks and benefits of endoscopic evaluation were discussed with the  patient; these include but are not limited to the risk of perforation, infection, bleeding, missed lesions, lack of diagnosis, severe illness requiring hospitalization, as well as anesthesia and sedation related illnesses.  The patient is agreeable to proceed.  The patient will be due for colon cancer screening in 2025.  Not sure I would advocate for patient to undergo cholecystectomy at this time with just a low normal EF and the chronic discomfort without associated LFT abnormalities.  All patient questions were answered to the best of my ability, and the patient agrees to the aforementioned plan of action with follow-up as indicated.   PLAN  Consideration of hepatic function panel/amylase/lipase was had but deferred by patient Diagnostic endoscopy for new onset dysphagia Colonoscopy due in 2025 Follow-up with sports medicine and neurology after MRI back that is scheduled Consideration of abdominal wall injection or intercostal injection by sports medicine versus pain medicine if thoracic imaging is unremarkable   Orders Placed This Encounter  Procedures  . Ambulatory referral to Gastroenterology    New Prescriptions   No medications on file   Modified Medications   No medications on file    Planned Follow Up No follow-ups on file.   Total Time in Face-to-Face and in  Coordination of Care for patient including independent/personal interpretation/review of prior testing, medical history, examination, medication adjustment, communicating results with the patient directly, and documentation with the EHR is 45 minutes.  Justice Britain, MD [facility] Office # [phone]

## 2021-03-27 NOTE — Patient Instructions (Signed)
If you are age 68 or older, your body mass index should be between 23-30. Your Body mass index is 30.56 kg/m. If this is out of the aforementioned range listed, please consider follow up with your Primary Care Provider.   You have been scheduled for an endoscopy and colonoscopy. Please follow the written instructions given to you at your visit today. Please pick up your prep supplies at the pharmacy within the next 1-3 days. If you use inhalers (even only as needed), please bring them with you on the day of your procedure.   Due to recent changes in healthcare laws, you may see the results of your imaging and laboratory studies on MyChart before your provider has had a chance to review them.  We understand that in some cases there may be results that are confusing or concerning to you. Not all laboratory results come back in the same time frame and the provider may be waiting for multiple results in order to interpret others.  Please give [place] 48 hours in order for your provider to thoroughly review all the results before contacting the office for clarification of your results.   Thank you for choosing me and [facility].  Dr. Rush Landmark

## 2021-03-29 NOTE — Progress Notes (Signed)
Kindly inform the patient her MRI scan thoracic spine was normal

## 2021-04-01 ENCOUNTER — Telehealth: Payer: Self-pay | Admitting: Emergency Medicine

## 2021-04-01 NOTE — Telephone Encounter (Signed)
Spoke to patient over the phone, went over Dr. Clydene Fake review and findings regarding MRI of thoracic spine.    Patient denied further questions, verbalized understanding and expressed appreciation for the phone call.

## 2021-04-01 NOTE — Telephone Encounter (Signed)
-----   Message from Garvin Fila, MD sent at 03/29/2021  3:56 PM EDT ----- Thomas [name] inform the patient her MRI scan thoracic spine was normal

## 2021-04-02 ENCOUNTER — Encounter: Payer: Self-pay | Admitting: Gastroenterology

## 2021-04-02 DIAGNOSIS — R109 Unspecified abdominal pain: Secondary | ICD-10-CM

## 2021-04-02 DIAGNOSIS — R1310 Dysphagia, unspecified: Secondary | ICD-10-CM | POA: Insufficient documentation

## 2021-04-02 HISTORY — DX: Unspecified abdominal pain: R10.9

## 2021-04-02 NOTE — Telephone Encounter (Signed)
Please see recent mychart message.  Continues to have right sided low thoracic/upper lumbar pain with wrapping around abdomen.  Not responding to conservative measures to date going back to end of last year.  Go ahead with MRI lumbar spine without contrast.  Thanks!

## 2021-04-03 ENCOUNTER — Other Ambulatory Visit: Payer: Self-pay

## 2021-04-03 DIAGNOSIS — R109 Unspecified abdominal pain: Secondary | ICD-10-CM

## 2021-04-03 DIAGNOSIS — M549 Dorsalgia, unspecified: Secondary | ICD-10-CM

## 2021-04-05 ENCOUNTER — Encounter: Payer: Self-pay | Admitting: Gastroenterology

## 2021-04-05 ENCOUNTER — Other Ambulatory Visit: Payer: Self-pay

## 2021-04-05 ENCOUNTER — Ambulatory Visit (AMBULATORY_SURGERY_CENTER): Payer: Medicare HMO | Admitting: Gastroenterology

## 2021-04-05 VITALS — BP 113/52 | HR 68 | Temp 97.5°F | Resp 14 | Ht 70.0 in | Wt 213.0 lb

## 2021-04-05 DIAGNOSIS — K2970 Gastritis, unspecified, without bleeding: Secondary | ICD-10-CM | POA: Diagnosis not present

## 2021-04-05 DIAGNOSIS — R109 Unspecified abdominal pain: Secondary | ICD-10-CM | POA: Diagnosis not present

## 2021-04-05 DIAGNOSIS — K317 Polyp of stomach and duodenum: Secondary | ICD-10-CM

## 2021-04-05 DIAGNOSIS — K2270 Barrett's esophagus without dysplasia: Secondary | ICD-10-CM

## 2021-04-05 DIAGNOSIS — K3189 Other diseases of stomach and duodenum: Secondary | ICD-10-CM | POA: Diagnosis not present

## 2021-04-05 DIAGNOSIS — K319 Disease of stomach and duodenum, unspecified: Secondary | ICD-10-CM | POA: Diagnosis not present

## 2021-04-05 DIAGNOSIS — R1310 Dysphagia, unspecified: Secondary | ICD-10-CM

## 2021-04-05 DIAGNOSIS — K2100 Gastro-esophageal reflux disease with esophagitis, without bleeding: Secondary | ICD-10-CM

## 2021-04-05 DIAGNOSIS — K2980 Duodenitis without bleeding: Secondary | ICD-10-CM

## 2021-04-05 DIAGNOSIS — K222 Esophageal obstruction: Secondary | ICD-10-CM

## 2021-04-05 DIAGNOSIS — K2289 Other specified disease of esophagus: Secondary | ICD-10-CM | POA: Diagnosis not present

## 2021-04-05 MED ORDER — ESOMEPRAZOLE MAGNESIUM 40 MG PO CPDR: 40.0000 mg | DELAYED_RELEASE_CAPSULE | Freq: Two times a day (BID) | ORAL | 3 refills | Status: DC

## 2021-04-05 MED ORDER — SODIUM CHLORIDE 0.9 % IV SOLN: 500.0000 mL | Freq: Once | INTRAVENOUS | Status: DC

## 2021-04-05 NOTE — Progress Notes (Signed)
History reviewed today 

## 2021-04-05 NOTE — Patient Instructions (Signed)
YOU HAD AN ENDOSCOPIC PROCEDURE TODAY AT THE [facility]:   Refer to the procedure report that was given to you for any specific questions about what was found during the examination.  If the procedure report does not answer your questions, please call your gastroenterologist to clarify.  If you requested that your care partner not be given the details of your procedure findings, then the procedure report has been included in a sealed envelope for you to review at your convenience later.  YOU SHOULD EXPECT: Some feelings of bloating in the abdomen. Passage of more gas than usual.  Walking can help get rid of the air that was put into your GI tract during the procedure and reduce the bloating. If you had a lower endoscopy (such as a colonoscopy or flexible sigmoidoscopy) you may notice spotting of blood in your stool or on the toilet paper. If you underwent a bowel prep for your procedure, you may not have a normal bowel movement for a few days.  Please Note:  You might notice some irritation and congestion in your nose or some drainage.  This is from the oxygen used during your procedure.  There is no need for concern and it should clear up in a day or so.  SYMPTOMS TO REPORT IMMEDIATELY:   Following lower endoscopy (colonoscopy or flexible sigmoidoscopy):  Excessive amounts of blood in the stool  Significant tenderness or worsening of abdominal pains  Swelling of the abdomen that is new, acute  Fever of 100F or higher   Following upper endoscopy (EGD)  Vomiting of blood or coffee ground material  New chest pain or pain under the shoulder blades  Painful or persistently difficult swallowing  New shortness of breath  Fever of 100F or higher  Black, tarry-looking stools  For urgent or emergent issues, a gastroenterologist can be reached at any hour by calling [phone]. Do not use MyChart messaging for urgent concerns.    DIET:  We do recommend a small meal at first, but  then you may proceed to your regular diet.  Drink plenty of fluids but you should avoid alcoholic beverages for 24 hours.  ACTIVITY:  You should plan to take it easy for the rest of today and you should NOT DRIVE or use heavy machinery until tomorrow (because of the sedation medicines used during the test).    FOLLOW UP: Our staff will call the number listed on your records 48-72 hours following your procedure to check on you and address any questions or concerns that you may have regarding the information given to you following your procedure. If we do not reach you, we will leave a message.  We will attempt to reach you two times.  During this call, we will ask if you have developed any symptoms of COVID 19. If you develop any symptoms (ie: fever, flu-like symptoms, shortness of breath, cough etc.) before then, please call [phone].  If you test positive for Covid 19 in the 2 weeks post procedure, please call and report this information to us.    If any biopsies were taken you will be contacted by phone or by letter within the next 1-3 weeks.  Please call us at [phone] if you have not heard about the biopsies in 3 weeks.    SIGNATURES/CONFIDENTIALITY: You and/or your care partner have signed paperwork which will be entered into your electronic medical record.  These signatures attest to the fact that that the information above on   your After Visit Summary has been reviewed and is understood.  Full responsibility of the confidentiality of this discharge information lies with you and/or your care-partner. 

## 2021-04-05 NOTE — Op Note (Signed)
[facility] [name] Procedure Date: 04/05/2021 2:06 PM MRN: [id number] Endoscopist: Justice Britain , MD Age: 70 Referring MD:  Date of Birth: Apr 03, 1951 Gender: Male Account #: [id number] Procedure:                Upper GI endoscopy Indications:              Abdominal pain in the right upper quadrant/right                            flank - query abdominal wall pain, Dysphagia Medicines:                Monitored Anesthesia Care Procedure:                Pre-Anesthesia Assessment:                           - Prior to the procedure, a History and Physical                            was performed, and patient medications and                            allergies were reviewed. The patient's tolerance of                            previous anesthesia was also reviewed. The risks                            and benefits of the procedure and the sedation                            options and risks were discussed with the patient.                            All questions were answered, and informed consent                            was obtained. Prior Anticoagulants: The patient has                            taken no previous anticoagulant or antiplatelet                            agents except for aspirin. ASA Grade Assessment:                            III - A patient with severe systemic disease. After                            reviewing the risks and benefits, the patient was                            deemed in satisfactory condition to undergo the  procedure.                           After obtaining informed consent, the endoscope was                            passed under direct vision. Throughout the                            procedure, the patient's blood pressure, pulse, and                            oxygen saturations were monitored continuously. The                            Endoscope was introduced through the  mouth, and                            advanced to the second part of duodenum. The upper                            GI endoscopy was accomplished without difficulty.                            The patient tolerated the procedure. Scope In: Scope Out: Findings:                 No gross lesions were noted in the proximal                            esophagus, in the mid esophagus, and majority of                            the distal esophagus. Biopsies were taken with a                            cold forceps for histology to rule out EoE/LoE.                           LA Grade B (one or more mucosal breaks greater than                            5 mm, not extending between the tops of two mucosal                            folds) esophagitis with no bleeding was found in                            the distal esophagus just proximal to the upcoming                            Schatki ring.  An island of salmon-colored mucosa was present from                            35 to 37 cm. No other visible abnormalities were                            present. Biopsies were taken with a cold forceps                            for histology to rule out Barrett's.                           A low-grade of narrowing Schatzki ring was found in                            the distal esophagus. This was partially disrupted                            with forceps (not sent for histology). After the                            rest of the EGD was complete, a guidewire was                            placed and the scope was withdrawn. Dilation was                            performed with a Savary dilator with no resistance                            at 16 mm and mild resistance at 18 mm. The dilation                            site was examined following endoscope reinsertion                            and showed mild mucosal disruption at the ring.                           The Z-line  was irregular and was found 37 cm from                            the incisors.                           A 4 cm hiatal hernia was present.                           Scattered moderate inflammation characterized by                            congestion (edema), erosions, erythema and  friability was found in the entire examined                            stomach. Biopsies were taken with a cold forceps                            for histology and Helicobacter pylori testing.                           A single 18 mm mucosal nodule was found in the                            duodenal bulb. Biopsies were taken with a cold                            forceps for histology - query Bruenner's gland v                            hyperplastic polyp.                           A single 2 mm sessile polyp with no bleeding was                            found just inferior to the major papilla. The polyp                            was removed with a piecemeal technique using a cold                            biopsy forceps. Resection and retrieval were                            complete.                           No other gross lesions were noted in the duodenal                            bulb, in the first portion of the duodenum and in                            the second portion of the duodenum. Biopsies were                            taken with a cold forceps for histology. Complications:            No immediate complications. Estimated Blood Loss:     Estimated blood loss was minimal. Impression:               - No gross lesions in majority of esophagus.                            Biopsied for EoE.                           -  LA Grade B esophagitis with no bleeding distally.                           - Salmon-colored mucosal island suspicious for                            Barrett's esophagus. Biopsied.                           - Low-grade of narrowing Schatzki ring.  Disrupted                            with biopsies and then dilated with Savary.                           - Z-line irregular, 37 cm from the incisors.                           - 4 cm hiatal hernia.                           - Gastritis. Biopsied.                           - Mucosal nodule in the duodenum bulb. Biopsied.                           - A single duodenal polyp inferior to major                            papilla. Resected and retrieved.                           - No other gross lesions in the duodenal bulb, in                            the first portion of the duodenum and in the second                            portion of the duodenum. Biopsied. Recommendation:           - The patient will be observed post-procedure,                            until all discharge criteria are met.                           - Discharge patient to home.                           - Patient has a contact number available for                            emergencies. The signs and symptoms of potential  delayed complications were discussed with the                            patient. Return to normal activities tomorrow.                            Written discharge instructions were provided to the                            patient.                           - Please use Cepacol or Halls Lozenges +/-                            Chloraseptic spray for next 72-96 hours to aid in                            sore thoat should you experience this.                           - Dilation diet as per protocol.                           - Observe patient's clinical course.                           - Start Nexium 40 mg twice daily for 48-month and                            then decrease to once daily and maintain.                           - Await pathology results.                           - Repeat upper endoscopy in 3 months to check                            healing and  repeat dilation (potentially with CRE)                            if dysphagia symptoms persist.                           - I do not believe that findings today are going to                            necessarily explain patient's symptoms that sound                            to be MSK vs abdominal wall in origin based on  clinical history. However, treatment is necessary                            for healing.                           - The findings and recommendations were discussed                            with the patient.                           - The findings and recommendations were discussed                            with the patient's family. Justice Britain, MD 04/05/2021 3:07:36 PM

## 2021-04-05 NOTE — Progress Notes (Signed)
To PACU, VSS. Report to [email or web address] 

## 2021-04-05 NOTE — Progress Notes (Signed)
Patient needs to be scheduled in 3 months for another EGD.

## 2021-04-06 ENCOUNTER — Other Ambulatory Visit: Payer: Self-pay | Admitting: Cardiology

## 2021-04-08 ENCOUNTER — Other Ambulatory Visit: Payer: Self-pay

## 2021-04-08 MED ORDER — PANTOPRAZOLE SODIUM 40 MG PO TBEC: 40.0000 mg | DELAYED_RELEASE_TABLET | Freq: Two times a day (BID) | ORAL | 3 refills | Status: DC

## 2021-04-08 NOTE — Telephone Encounter (Signed)
Cost significant for patient for Nexium 40 BID. Let's try Protonix 40 BID for patient. If this is still too expensive then will do Omeprazole 40 BID.  Thanks.  GM

## 2021-04-09 ENCOUNTER — Telehealth: Payer: Self-pay | Admitting: *Deleted

## 2021-04-09 NOTE — Telephone Encounter (Signed)
  Follow up Call-  Call back number 04/05/2021  Post procedure Call Back phone  # [phone]  Permission to leave phone message Yes  Some recent data might be hidden     Patient questions:  Do you have a fever, pain , or abdominal swelling? No. Pain Score  0 *  Have you tolerated food without any problems? Yes.    Have you been able to return to your normal activities? Yes.    Do you have any questions about your discharge instructions: Diet   No. Medications  No. Follow up visit  No.  Do you have questions or concerns about your Care? No.  Actions: * If pain score is 4 or above: No action needed, pain <4.  1. Have you developed a fever since your procedure? no  2.   Have you had an respiratory symptoms (SOB or cough) since your procedure? no  3.   Have you tested positive for COVID 19 since your procedure no  4.   Have you had any family members/close contacts diagnosed with the COVID 19 since your procedure?no   If yes to any of these questions please route to Joylene John, RN and Joella Prince, RN

## 2021-04-11 MED ORDER — DIAZEPAM 5 MG PO TABS: ORAL_TABLET | ORAL | 0 refills | Status: DC

## 2021-04-14 ENCOUNTER — Ambulatory Visit
Admission: RE | Admit: 2021-04-14 | Discharge: 2021-04-14 | Disposition: A | Discharge status: Home / Self Care | Payer: Medicare HMO | Source: Ambulatory Visit | Attending: Family Medicine | Admitting: Family Medicine

## 2021-04-14 DIAGNOSIS — M545 Low back pain, unspecified: Secondary | ICD-10-CM | POA: Diagnosis not present

## 2021-04-14 DIAGNOSIS — R109 Unspecified abdominal pain: Secondary | ICD-10-CM

## 2021-04-14 DIAGNOSIS — M549 Dorsalgia, unspecified: Secondary | ICD-10-CM

## 2021-04-17 ENCOUNTER — Telehealth (INDEPENDENT_AMBULATORY_CARE_PROVIDER_SITE_OTHER): Payer: Medicare HMO | Admitting: Family Medicine

## 2021-04-17 ENCOUNTER — Encounter: Payer: Self-pay | Admitting: Gastroenterology

## 2021-04-17 VITALS — Ht 71.0 in | Wt 212.0 lb

## 2021-04-17 DIAGNOSIS — M549 Dorsalgia, unspecified: Secondary | ICD-10-CM | POA: Diagnosis not present

## 2021-04-17 NOTE — Progress Notes (Signed)
Virtual Visit via Video Note  I connected with Thomas [name] on 04/17/21 at 11:15 AM EDT by a video enabled telemedicine application and verified that I am speaking with the correct person using two identifiers.  Location: Patient: home Provider: [place]   I discussed the limitations of evaluation and management by telemedicine and the availability of in person appointments. The patient expressed understanding and agreed to proceed.  History of Present Illness: Patient continues to have pain right side of back wrapping around to abdomen.  He had thoracic and lumbar spine MRIs after extensive workup has not shown cause for this pain.  MRIs did not show a radicular source.  He's taking gabapentin and tramadol.    Observations/Objective: Speaking full sentences comfortably, no acute distress  Assessment and Plan: Right sided back/abdominal pain - we reviewed all his workup to date (see prior visit, notes from other specialists).  GI, renal causes ruled out.  No radicular cause for his pain.  Question if there is something more proximal neurologically that can account for this vs small fiber neuropathy.  Would refer to neurology for their input.  He had a lacunar stroke but pain did not start until some time after this.  If this evaluation is also negative would consider referral to PM&R to discuss potential nerve blocks (?intercostal).  Follow Up Instructions:    I discussed the assessment and treatment plan with the patient. The patient was provided an opportunity to ask questions and all were answered. The patient agreed with the plan and demonstrated an understanding of the instructions.   The patient was advised to call back or seek an in-person evaluation if the symptoms worsen or if the condition fails to improve as anticipated.  I provided 15 minutes of non-face-to-face time during this encounter.   Karlton Lemon, MD

## 2021-04-19 ENCOUNTER — Other Ambulatory Visit: Payer: Self-pay

## 2021-04-19 DIAGNOSIS — R109 Unspecified abdominal pain: Secondary | ICD-10-CM

## 2021-04-19 DIAGNOSIS — M549 Dorsalgia, unspecified: Secondary | ICD-10-CM

## 2021-04-23 DIAGNOSIS — M1A00X0 Idiopathic chronic gout, unspecified site, without tophus (tophi): Secondary | ICD-10-CM | POA: Insufficient documentation

## 2021-04-23 DIAGNOSIS — M1991 Primary osteoarthritis, unspecified site: Secondary | ICD-10-CM | POA: Insufficient documentation

## 2021-04-30 ENCOUNTER — Ambulatory Visit: Payer: Medicare HMO | Admitting: Cardiology

## 2021-05-03 IMAGING — MR MR CERVICAL SPINE W/O CM
4 of 6 series · 32 of 48 positions shown · non-contrast
Comparison: None.

CLINICAL DATA: Right-sided weakness

EXAM:
MRI CERVICAL SPINE WITHOUT CONTRAST
TECHNIQUE: Multiplanar, multisequence MR imaging of the cervical spine was
performed. No intravenous contrast was administered.

[Series 5: T1 · sagittal · 3.0mm · 0.69mm/px · 5 of 15 slices shown (1 of 2)]
[im 1/15]
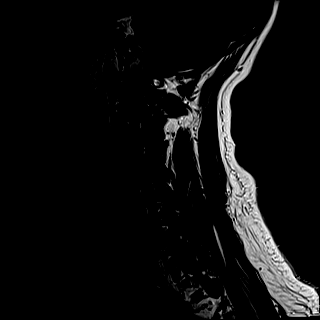
[im 4/15]
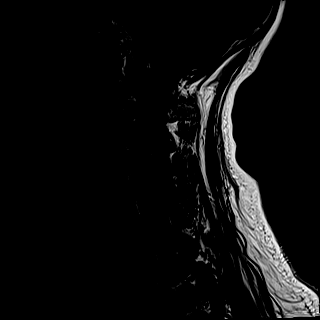
[im 8/15]
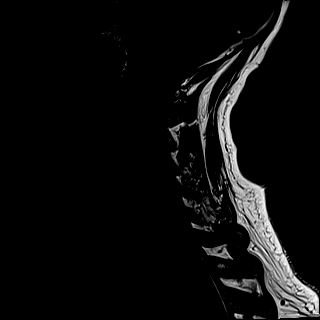
[im 11/15]
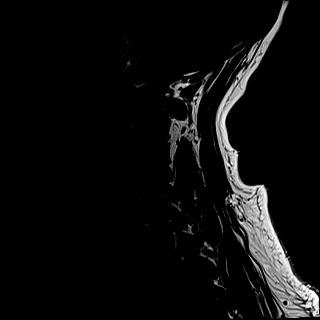
[im 15/15]
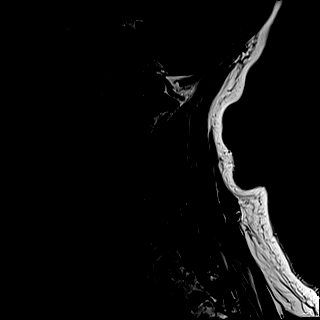

[Series 6: T2 · sagittal · 3.0mm · 0.69mm/px · 5 of 15 slices shown (1 of 2)]
[im 1/15]
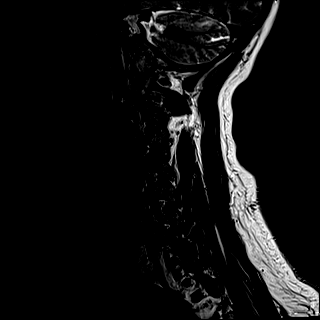
[im 4/15]
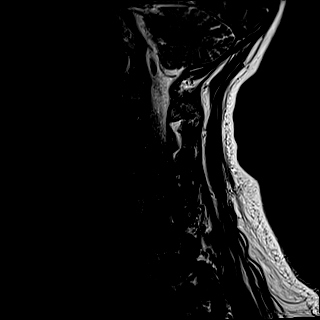
[im 8/15]
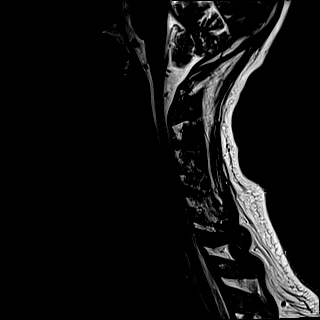
[im 11/15]
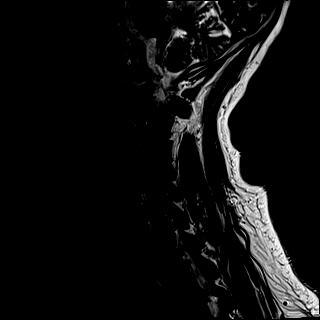
[im 15/15]
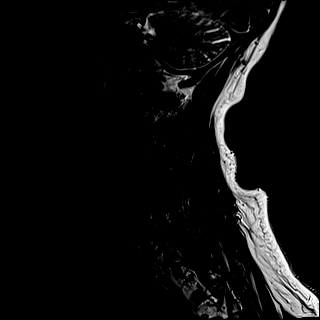

[Series 8: T2 · axial · 3.0mm · 0.70mm/px · z∈[-108,+0]mm · 11 of 37 slices shown (2 of 2)]
[im 1/37]
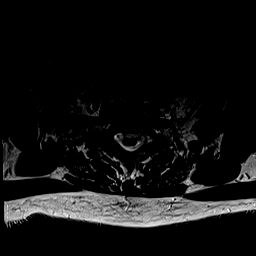
[im 4/37]
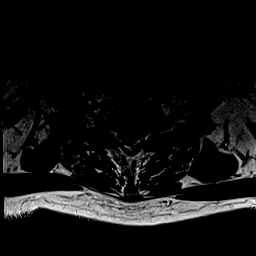
[im 8/37]
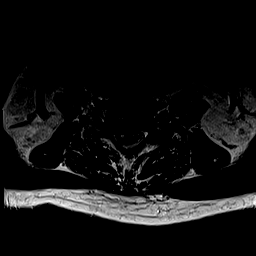
[im 11/37]
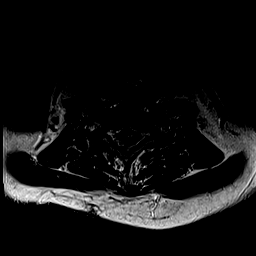
[im 15/37]
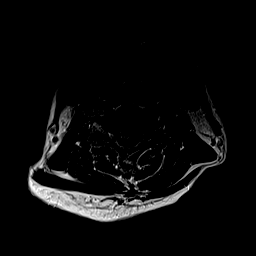
[im 19/37]
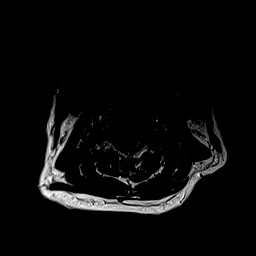
[im 22/37]
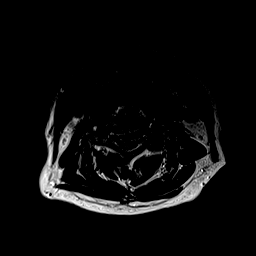
[im 26/37]
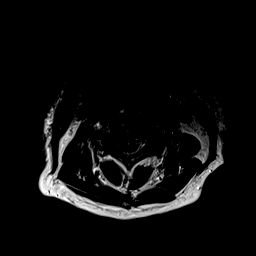
[im 29/37]
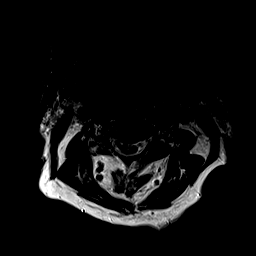
[im 33/37]
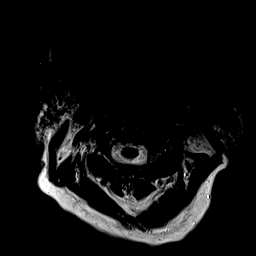
[im 37/37]
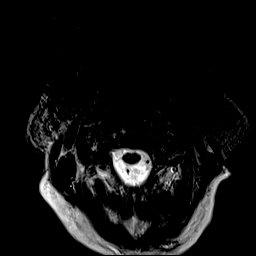

[Series 10: T1 · axial · 3.0mm · 0.35mm/px · z∈[-108,+0]mm · 11 of 37 slices shown (2 of 2)]
[im 1/37]
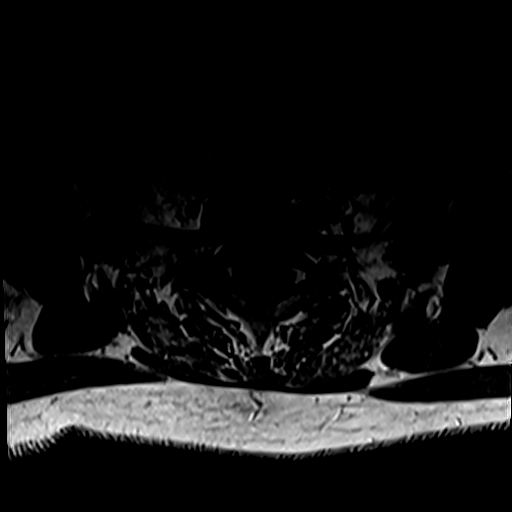
[im 4/37]
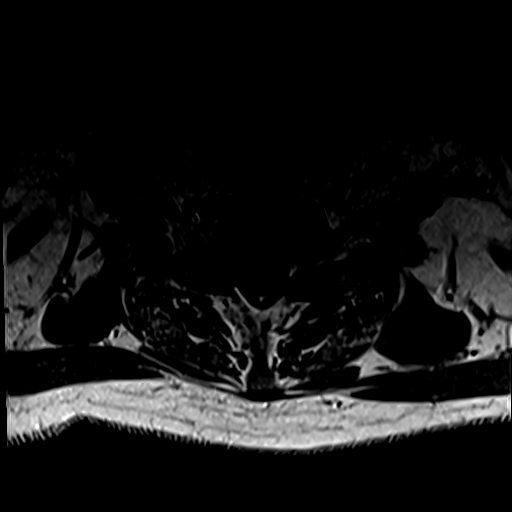
[im 8/37]
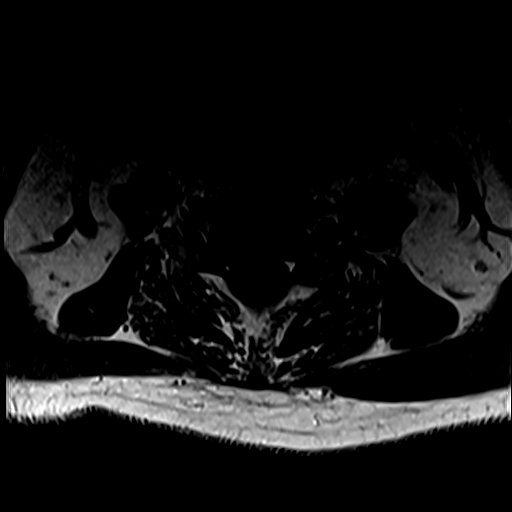
[im 11/37]
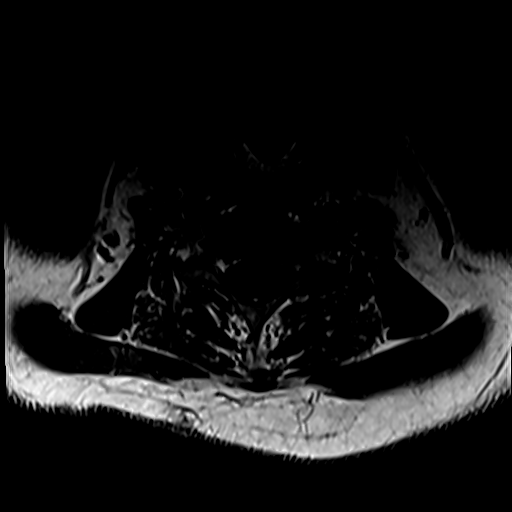
[im 15/37]
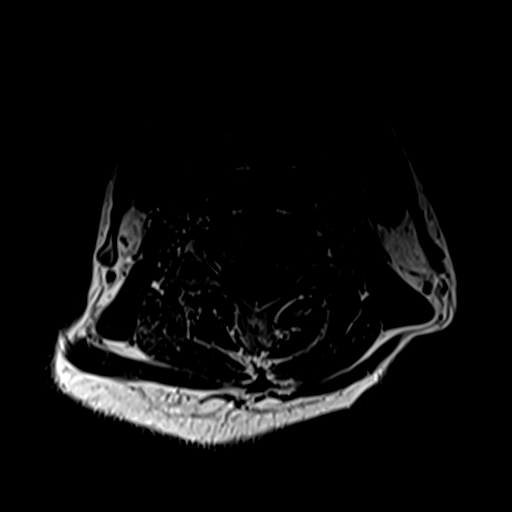
[im 19/37]
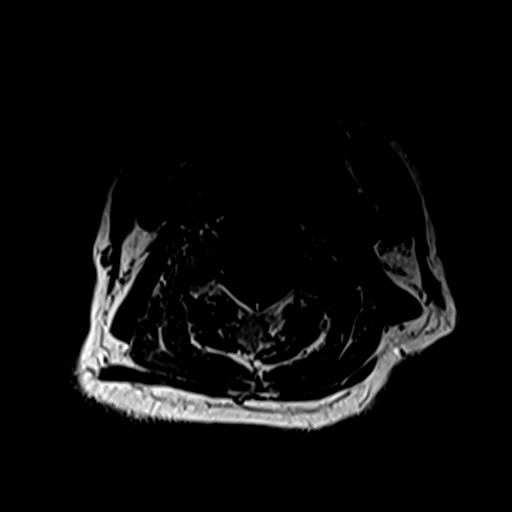
[im 22/37]
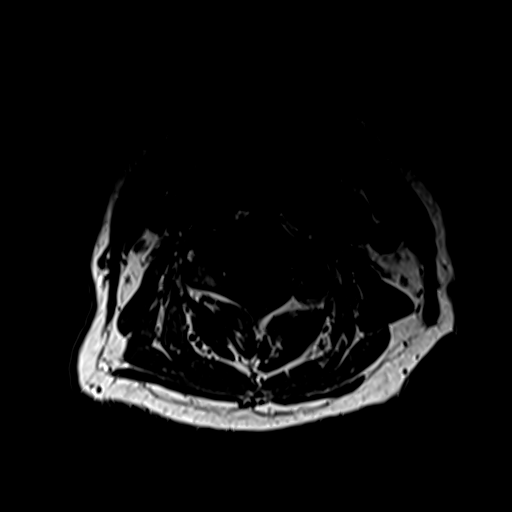
[im 26/37]
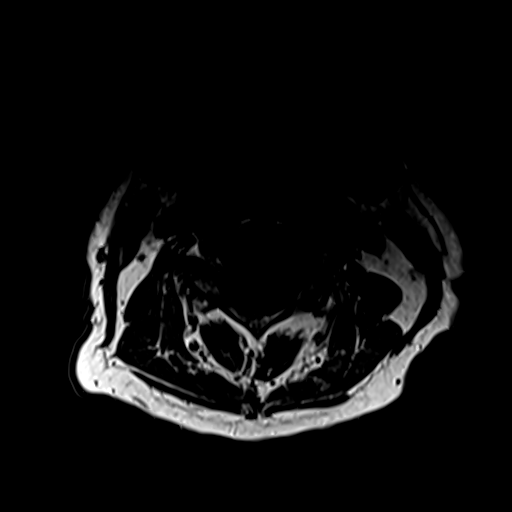
[im 29/37]
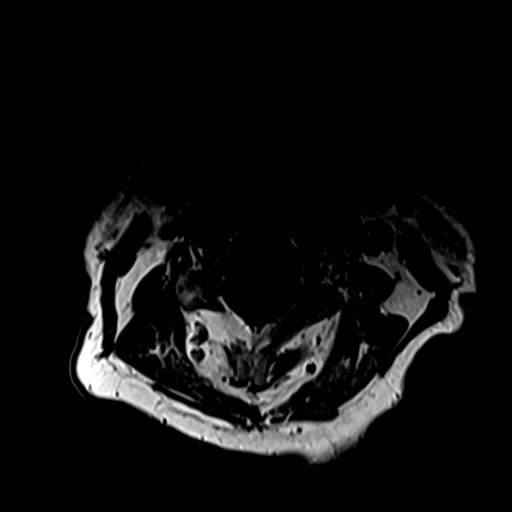
[im 33/37]
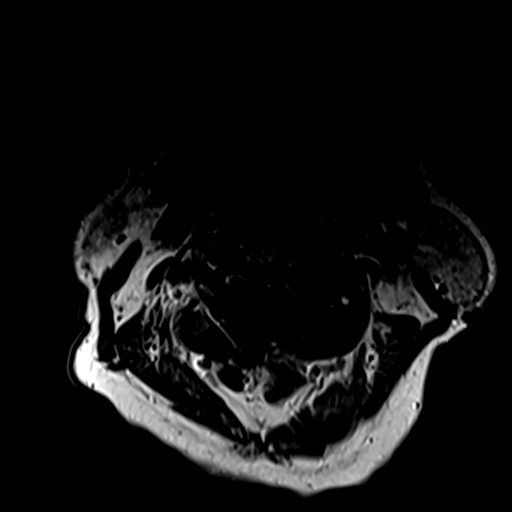
[im 37/37]
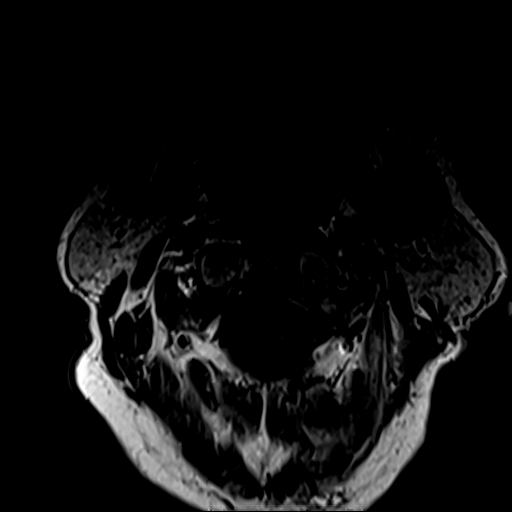

[32 of 48 positions shown; findings below may reference images not displayed]

FINDINGS: Alignment: Mild straightening of the cervical lordosis without
static listhesis.

Vertebrae: No fracture, evidence of discitis, or bone lesion. Mild
diffuse intrinsic canal narrowing on the basis of congenitally short
pedicles.

Cord: Normal signal and morphology.

Posterior Fossa, vertebral arteries, paraspinal tissues: Two T2
hyperintense nodules within the left thyroid lobe, largest measuring
up to 1.2 cm. Vertebral artery flow voids are preserved. Visualized
posterior fossa is within normal limits.

Disc levels:

C2-C3: No disc protrusion. Mild right-sided facet arthropathy. No
foraminal or canal stenosis.

C3-C4: Small posterior disc osteophyte complex with mild bilateral
facet and uncovertebral arthropathy result in mild-to-moderate canal
stenosis with moderate bilateral foraminal stenosis.

C4-C5: Small posterior disc osteophyte complex and mild bilateral
facet and uncovertebral arthropathy result in mild canal stenosis
with moderate right and mild left foraminal stenosis.

C5-C6: Small posterior disc osteophyte complex with left greater
than right facet and uncovertebral arthropathy resulting in mild
canal stenosis with moderate left and mild right foraminal stenosis.

C6-C7: Small posterior disc osteophyte complex with right greater
than left facet and uncovertebral arthropathy resulting in severe
right and moderate left foraminal stenosis and mild canal stenosis.

C7-T1: Small posterior disc osteophyte complex without foraminal or
canal stenosis.
IMPRESSION: 1. No acute osseous abnormality or abnormal cord signal.
2. Mild to moderate C3-4 and mild C4-5 through C6-7 canal stenosis.
3. Severe right C6-7 and moderate bilateral C3-4 and C4-5 foraminal
stenosis. Multilevel mild foraminal stenosis.
4. Mild diffuse intrinsic cervical canal narrowing on the basis of
congenitally short pedicles.
5. Left thyroid lobe nodules measuring up to 12 mm. Recommend
thyroid US (ref: [HOSPITAL]. [DATE]): 143-50).

## 2021-05-03 IMAGING — DX DG CHEST 2V
2 series · 2 of 2 positions shown · non-contrast
Comparison: None.

CLINICAL DATA: 69-year-old male with shortness of breath.

EXAM:
CHEST - 2 VIEW

[chest pa]
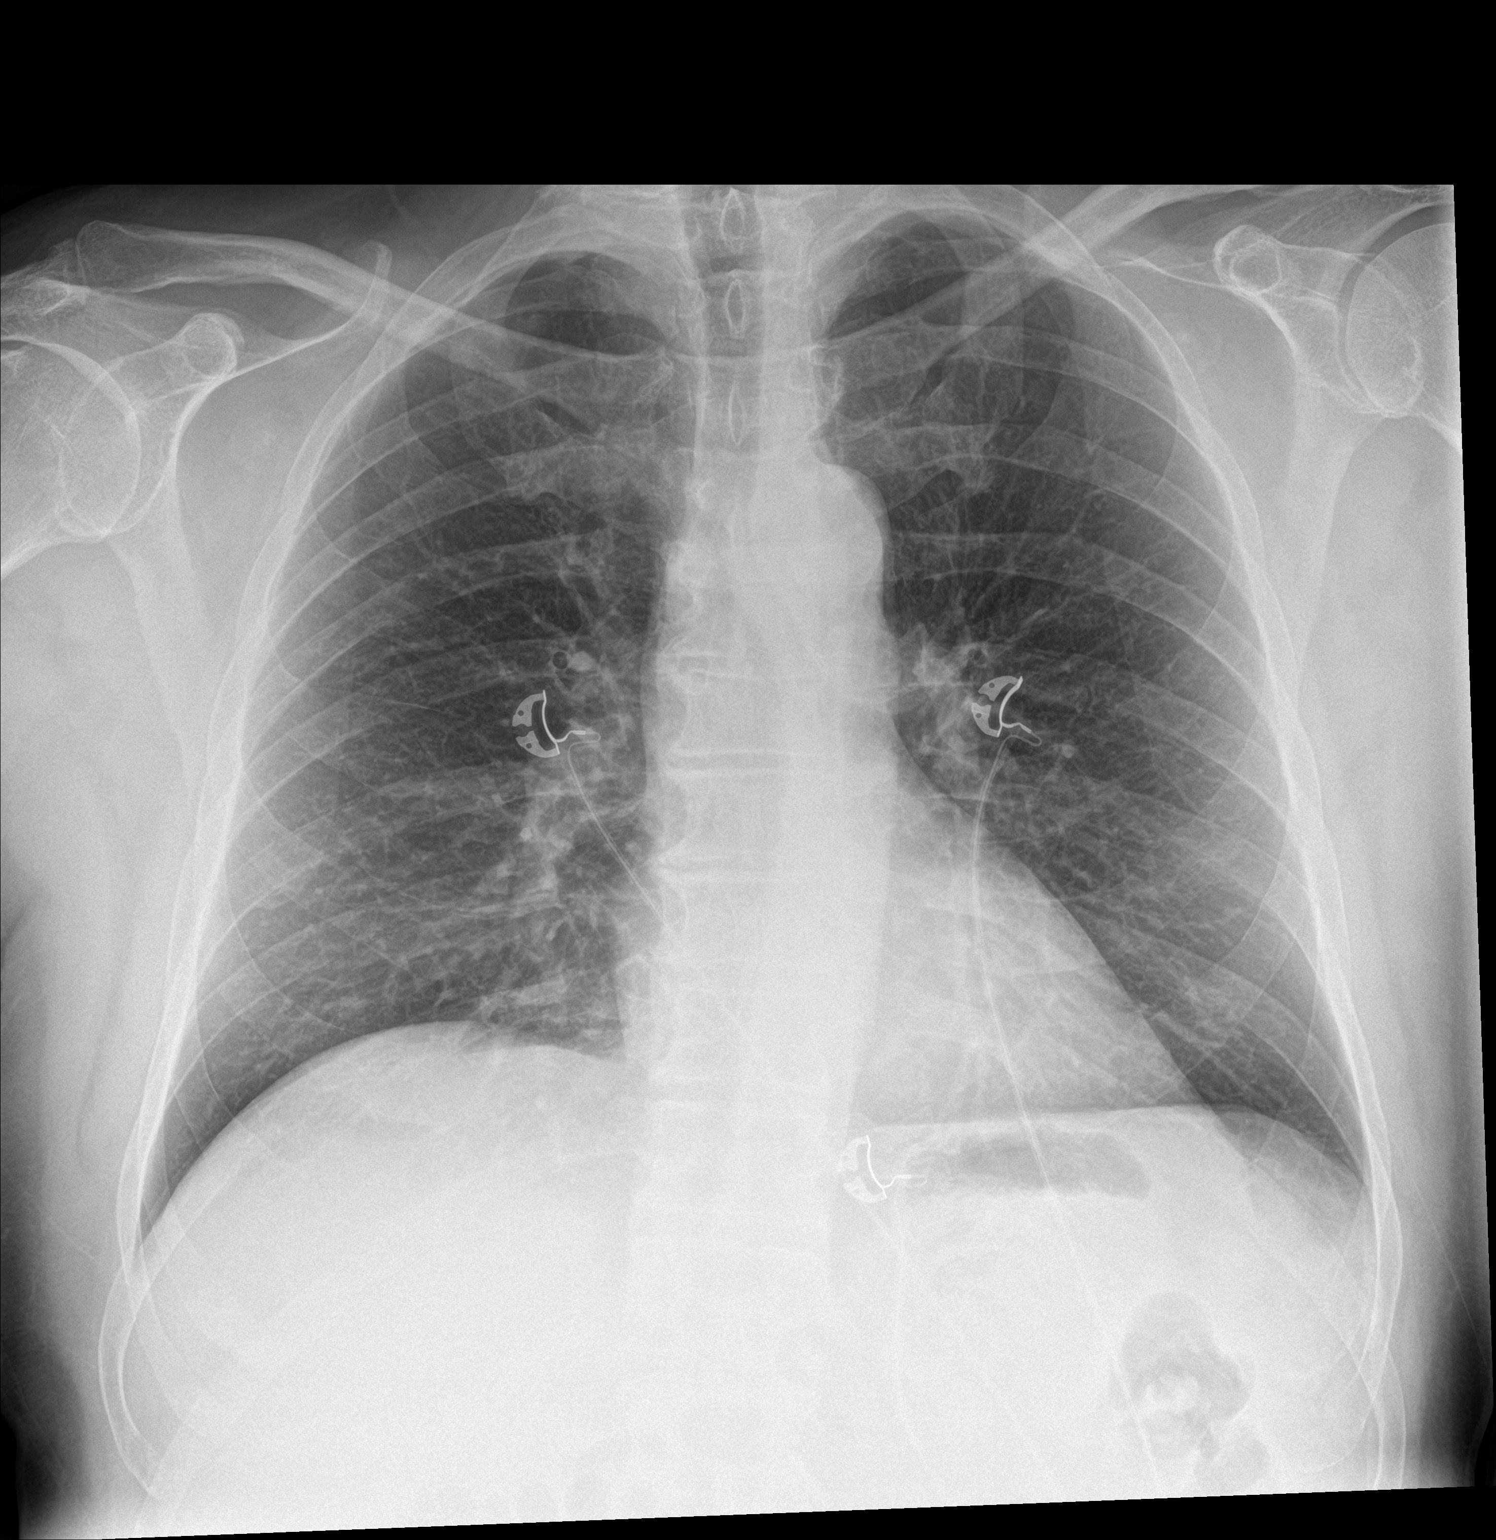

[chest lat]
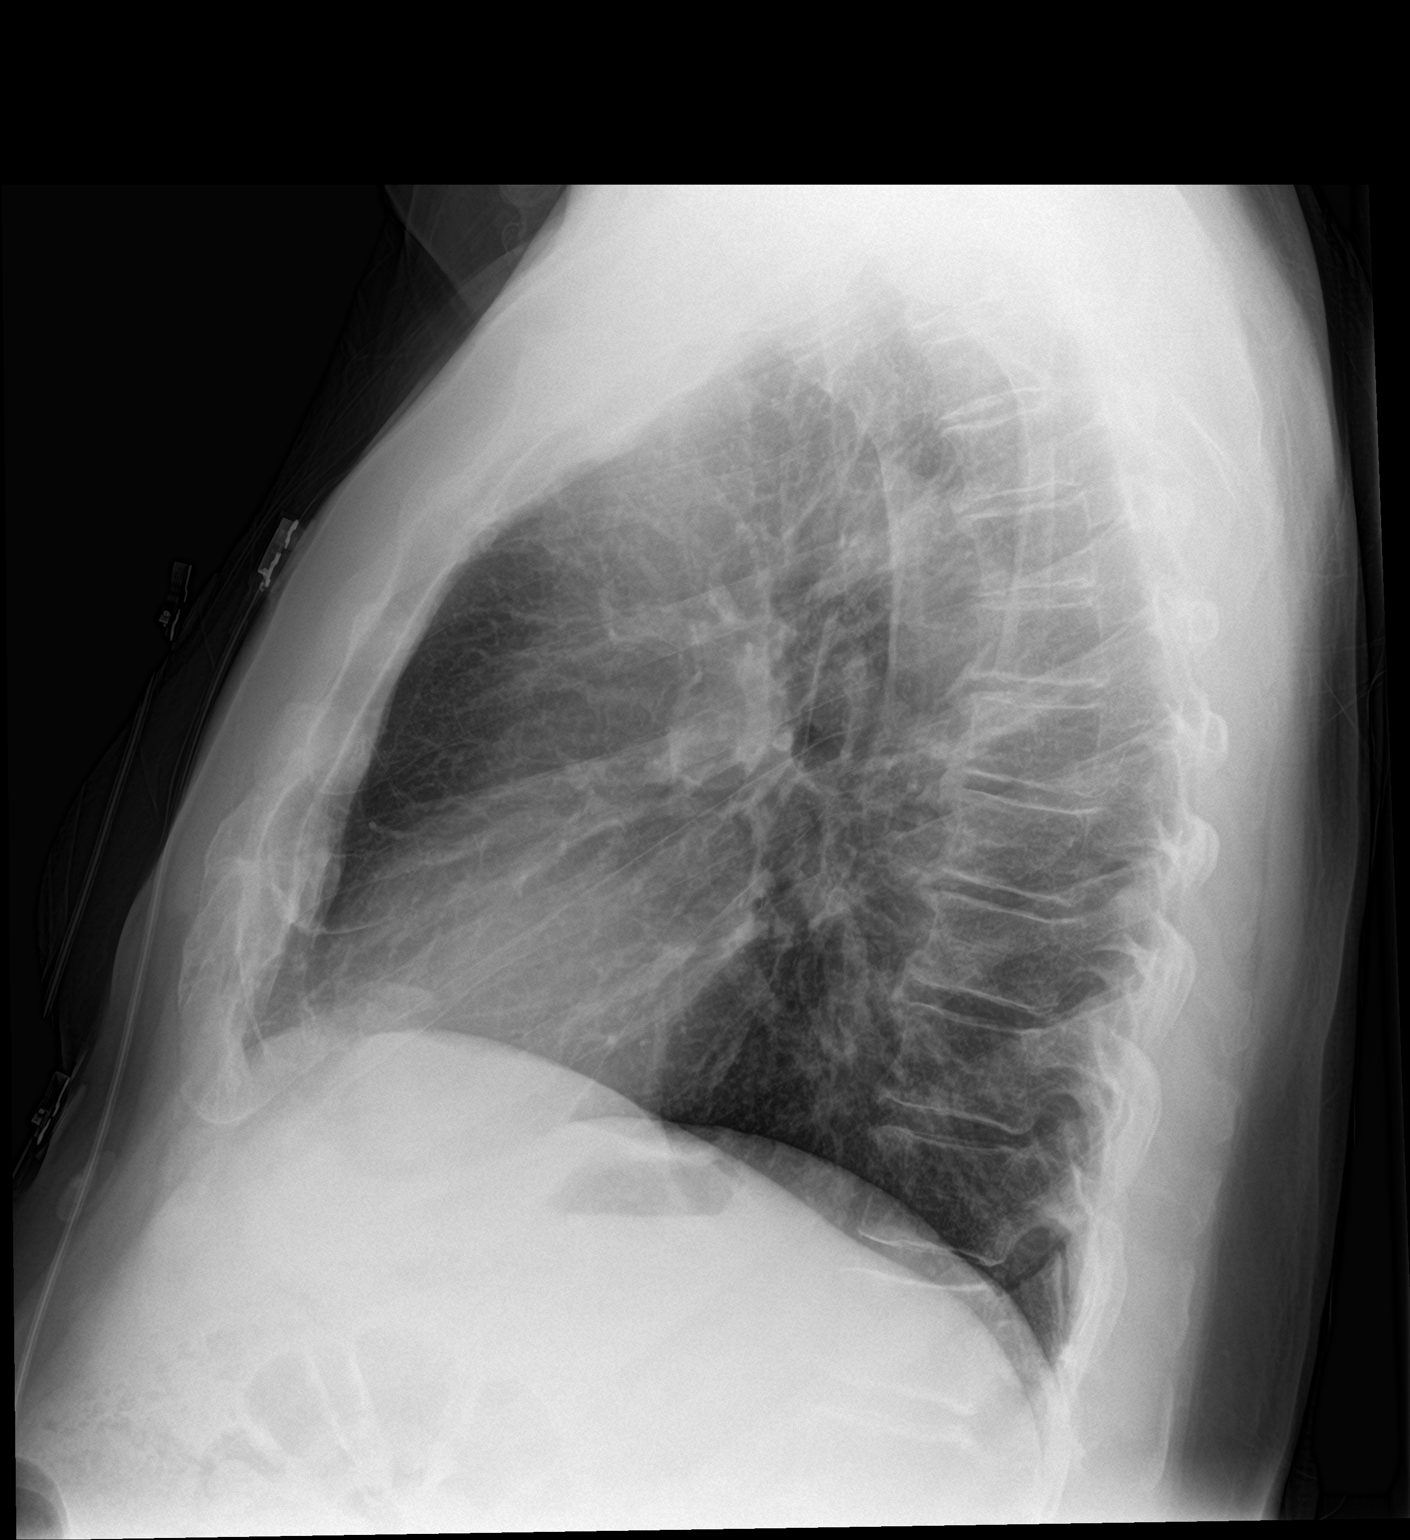

[2 of 2 positions shown; findings below may reference images not displayed]

FINDINGS: The lungs are clear. There is no pleural effusion pneumothorax. The
cardiac silhouette is within normal limits. No acute osseous
pathology.
IMPRESSION: No active cardiopulmonary disease.

## 2021-05-29 ENCOUNTER — Ambulatory Visit: Payer: Medicare HMO | Admitting: Neurology

## 2021-05-29 ENCOUNTER — Encounter: Payer: Self-pay | Admitting: Neurology

## 2021-05-29 VITALS — BP 149/81 | HR 101 | Ht 71.0 in | Wt 214.5 lb

## 2021-05-29 DIAGNOSIS — M792 Neuralgia and neuritis, unspecified: Secondary | ICD-10-CM

## 2021-05-29 MED ORDER — TOPIRAMATE 25 MG PO TABS: 25.0000 mg | ORAL_TABLET | Freq: Two times a day (BID) | ORAL | 3 refills | Status: DC

## 2021-05-29 NOTE — Progress Notes (Addendum)
[facility] [address] [place]. [place] [address] [phone]       OFFICE FOLLOW-UP NOTE  [name] Date of Birth:  [birth year] Medical Record Number:  [id number]   HPI: He returns for follow-up today after last video visit on 11/29/2020 with Janett Billow nurse practitioner Mr. Stefanelli is a 70 year old Caucasian male with left thalamic lacunar infarct on 03/22/2020 with residual post stroke paresthesias in right hemibody.  He has vascular risk factors of hypertension, hyperlipidemia, age, former tobacco and alcohol use.  He has had residual hypersensitivity on the right side.  He now has a new complaint of right thoracoabdominal pain that he has had for several weeks.  Describes this pain as starting just below the rib cage and going up around the rib margin.  This at times is dull aching at times it sharp and shooting.  He was seen in the ER on 01/14/2021 for these complaints and it was felt that the pain was related to reflux symptoms from gastritis/GERD.  And underwent abdominal and pelvic CT scan which showed no acute abnormality.  Subsequently had a gallbladder scan which was also negative.  Is also seen a urologist did not think it was a kidney stone.  He was found to have small groin hernia but the surgeon did not feel his symptoms can be explained by the hernia.  He now complains of constant ache in just below his rib cage in the abdomen but at times it shoots up and down.  He takes tramadol which helps but does not get rid of the pain.  He does take gabapentin 300 mg at night for his postop paresthesias but he has not tried taking it during the day.  He denies any vesicular rash or itching on his back.  He has no prior history of shingles.  He has not tried medications like Topamax or Lyrica.  He is quite frustrated with lack of explanation for his pain and would like me to do neurological evaluation for the same.  He is doing well from stroke standpoint without recurrent  stroke or TIA symptoms.  He has stopped taking aspirin as he was taking other medications for pain.  His blood pressure is usually well controlled today it is slightly elevated 155/83. Update 05/29/2021 : He returns for follow-up after last visit 3 and half months ago.  Patient states persistent right thoracoabdominal pain which is now constant and at times it is sharp and shooting.  Patient stated he was tried on Lyrica by me but he could not afford it and hence did not try it.  He has been using lidocaine local ointment instead of a patch which is cheaper and it seems to provide some relief.  He is also taking gabapentin 300 mg at night which mostly helps him sleep better.  He cannot tolerate taking gabapentin during the day because of drowsiness.  He underwent MRI scan of thoracic spine on 03/27/2021 which showed no significant compressive lesions or spinal stenosis or nerve impingement to explain his pain.  Subsequently underwent MRI scan of the lumbar spine as well on 04/14/2021 which also did not show significant compressive lesions.  Patient says now at times the pain also goes down the upper medial aspect of his thigh and into his perineum.  Patient is extremely distressed with his pain and disability that he has from this and quality of life has become poor.  He is willing to try interventional pain management including spine injections  or TENS unit if it would help him and is agreeable for referral to pain clinic.  He wants to try narcotics and tramadol but I stated I was not willing to prescribe that.  He continues to remain on aspirin which is tolerating well.  He has had no recurrent stroke or TIA symptoms.  His blood pressure is well controlled today it is slightly elevated at 149/81.  He also underwent nuclear medicine hepatobiliary imaging of the gallbladder on 02/06/2021 which showed normal gallbladder ejection fraction.  Ultrasound of the abdomen on 01/29/2021 showed 2 mm polyp in the gallbladder and  right liver lobe benign cyst and mild fatty liver but no major abnormalities.  CT scan of the abdomen and pelvis with contrast on 01/14/2021 showed no acute findings and only slightly prominent prostate. ROS:   14 system review of systems is positive for numbness, tingling, back and abdominal pain and all other systems negative  PMH:  Past Medical History:  Diagnosis Date   Abdominal wall pain 04/02/2021   Acute ischemic stroke ([place]) 03/22/2020   Avulsed toenail, initial encounter 05/04/2018   Chronic gout    multiple sites--- followed by pcp--- (04-14-2018  per pt last flare-up , march 2019)   Dyspnea on exertion 02/10/2020   ED (erectile dysfunction)    Epidermoid cyst of neck 10/16/2017   Essential hypertension 07/21/2017   Gout 01/19/2014   Gout of multiple sites 07/21/2017   History of kidney stones    History of squamous cell carcinoma excision    Hyperlipidemia    Hypertension    Hypertension not at goal 01/19/2014   It band syndrome, left 03/19/2020   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/24/2020   Left ureteral calculus    Lower abdominal pain 08/27/2015   Metatarsalgia of both feet 03/19/2020   Multiple joint pain 07/16/2018   Pain in joint, shoulder region 12/11/2015   Preventative health care 11/14/2016   Right flank pain 04/02/2021   Right sided abdominal pain 01/14/2021   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   Urgency of urination    URI (upper respiratory infection) 11/08/2014   Wears glasses     Social History:  Social History   Socioeconomic History   Marital status: Married    Spouse name: [name]   Number of children: 2   Years of education: Not on file   Highest education level: Not on file  Occupational History   Occupation: Pharmacist, hospital  Tobacco Use   Smoking status: Former    Packs/day: 0.30    Years: 20.00    Pack years: 6.00    Types: Cigarettes    Quit date: 10/17/1994    Years since quitting: 26.6   Smokeless tobacco: Never  Vaping Use    Vaping Use: Not on file  Substance and Sexual Activity   Alcohol use: Not Currently   Drug use: No   Sexual activity: Yes  Other Topics Concern   Not on file  Social History Narrative   Exercise 1 hour a day ---3-4 days a week   Social Determinants of Health   Financial Resource Strain: Not on file  Food Insecurity: Not on file  Transportation Needs: Not on file  Physical Activity: Not on file  Stress: Not on file  Social Connections: Not on file  Intimate Partner Violence: Not on file    Medications:   Current Outpatient Medications on File Prior to Visit  Medication Sig Dispense Refill   allopurinol (ZYLOPRIM) 300 MG  tablet Take 450 mg by mouth daily.     aspirin EC 81 MG tablet Take 1 tablet (81 mg total) by mouth daily. Swallow whole. 30 tablet 11   colchicine 0.6 MG tablet Take 0.6 mg by mouth as needed (gout flare up).     gabapentin (NEURONTIN) 300 MG capsule Take 1 capsule (300 mg total) by mouth 2 (two) times daily. 60 capsule 11   hydrochlorothiazide (HYDRODIURIL) 25 MG tablet TAKE 1 TABLET BY MOUTH EVERY DAY 90 tablet 2   lidocaine (XYLOCAINE) 5 % ointment Apply 1 application topically as needed. 35.44 g 1   minoxidil (LONITEN) 2.5 MG tablet Take 3 tablets in the morning. Take 2 tablets at night. 150 tablet 6   pantoprazole (PROTONIX) 40 MG tablet Take 1 tablet (40 mg total) by mouth 2 (two) times daily. 60 tablet 3   traMADol (ULTRAM) 50 MG tablet Take 50 mg by mouth 2 (two) times daily as needed for severe pain or moderate pain.     valsartan (DIOVAN) 320 MG tablet TAKE 1 TABLET BY MOUTH EVERY DAY 90 tablet 3   No current facility-administered medications on file prior to visit.    Allergies:   Allergies  Allergen Reactions   Statins     Jaw tightness and severe muscle pain- Pravastatin     Physical Exam General: well developed, well nourished middle-aged Caucasian male, seated, in no evident distress Head: head normocephalic and atraumatic.  Neck:  supple with no carotid or supraclavicular bruits Cardiovascular: regular rate and rhythm, no murmurs Musculoskeletal: no deformity Skin:  no rash/petichiae macular rash in the right thoracolumbar paraspinal region.  No vesicles noted. Vascular:  Normal pulses all extremities Vitals:   05/29/21 1349  BP: (!) 149/81  Pulse: (!) 101   Neurologic Exam Mental Status: Awake and fully alert. Oriented to place and time. Recent and remote memory intact. Attention span, concentration and fund of knowledge appropriate. Mood and affect appropriate.  Cranial Nerves: Fundoscopic exam reveals sharp disc margins. Pupils equal, briskly reactive to light. Extraocular movements full without nystagmus. Visual fields full to confrontation. Hearing intact. Facial sensation intact. Face, tongue, palate moves normally and symmetrically.  Motor: Normal bulk and tone. Normal strength in all tested extremity muscles. Sensory.: intact to touch ,pinprick .position and vibratory sensation.  Subjective paresthesias right upper and lower extremity but no objective sensory loss Coordination: Rapid alternating movements normal in all extremities. Finger-to-nose and heel-to-shin performed accurately bilaterally. Gait and Station: Arises from chair without difficulty. Stance is normal. Gait demonstrates normal stride length and balance . Able to heel, toe and tandem walk without difficulty.  Reflexes: 1+ and symmetric. Toes downgoing.      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  Structural causes have been ruled out with unremarkable MRI scan of the thoracic and lumbar spine.  An abdominal CT scan, ultrasound and gallbladder scan have all been negative.  History of left thalamic lacunar infarct in May 2021 with residual mild post stroke paresthesias.  Vascular risk factors of hypertension     PLAN: I had a long discussion with the patient regarding his  refractory right-sided truncal abdominal neuropathy pain and discussed results of imaging studies of the thoracic and lumbar spine which are pretty much unremarkable not showing any structural lesion.  I recommend a trial of Topamax 25 mg twice daily to see if it helps and continue gabapentin 300 mg at night as well as topical lidocaine ointment.  Referral  to interventional pain management clinic to see if he is a candidate for TENS unit or spinal injections to provide him some relief as his symptoms seem to be quite disabling and debilitating.  Return for follow-up in the future as needed. Greater than 50% of time during this 25 minute visit was spent on counseling,explanation of diagnosis, neuropathic pain planning of further management, discussion with patient and family and coordination of care Antony Contras, MD Note: This document was prepared with digital dictation and possible smart phrase technology. Any transcriptional errors that result from this process are unintentional

## 2021-05-29 NOTE — Patient Instructions (Signed)
I had a long discussion with the patient regarding his refractory right-sided truncal abdominal neuropathy pain and discussed results of imaging studies of the thoracic and lumbar spine which are pretty much unremarkable not showing any structural lesion.  I recommend a trial of Topamax 25 mg twice daily to see if it helps and continue gabapentin 300 mg at night as well as topical lidocaine ointment.  Referral to interventional pain management clinic to see if he is a candidate for TENS unit or spinal injections to provide him some relief as his symptoms seem to be quite disabling and debilitating.  Return for follow-up in the future as needed.

## 2021-05-30 DIAGNOSIS — M150 Primary generalized (osteo)arthritis: Secondary | ICD-10-CM | POA: Diagnosis not present

## 2021-05-30 DIAGNOSIS — M1A09X0 Idiopathic chronic gout, multiple sites, without tophus (tophi): Secondary | ICD-10-CM | POA: Diagnosis not present

## 2021-05-30 DIAGNOSIS — E663 Overweight: Secondary | ICD-10-CM | POA: Diagnosis not present

## 2021-05-30 DIAGNOSIS — Z6829 Body mass index (BMI) 29.0-29.9, adult: Secondary | ICD-10-CM | POA: Diagnosis not present

## 2021-05-30 DIAGNOSIS — M2550 Pain in unspecified joint: Secondary | ICD-10-CM | POA: Diagnosis not present

## 2021-05-30 MED ORDER — LIDOCAINE 5 % EX OINT: 1.0000 "application " | TOPICAL_OINTMENT | CUTANEOUS | 3 refills | Status: DC | PRN

## 2021-06-04 ENCOUNTER — Encounter: Payer: Self-pay | Admitting: Physical Medicine & Rehabilitation

## 2021-06-05 ENCOUNTER — Ambulatory Visit: Payer: Medicare HMO | Admitting: Cardiology

## 2021-06-05 ENCOUNTER — Encounter: Payer: Self-pay | Admitting: Cardiology

## 2021-06-05 ENCOUNTER — Other Ambulatory Visit: Payer: Self-pay

## 2021-06-05 VITALS — BP 160/72 | HR 96 | Ht 71.0 in | Wt 215.1 lb

## 2021-06-05 DIAGNOSIS — I6930 Unspecified sequelae of cerebral infarction: Secondary | ICD-10-CM

## 2021-06-05 DIAGNOSIS — I10 Essential (primary) hypertension: Secondary | ICD-10-CM | POA: Diagnosis not present

## 2021-06-05 DIAGNOSIS — E781 Pure hyperglyceridemia: Secondary | ICD-10-CM

## 2021-06-05 MED ORDER — MINOXIDIL 10 MG PO TABS: 10.0000 mg | ORAL_TABLET | Freq: Every day | ORAL | 3 refills | Status: DC

## 2021-06-05 MED ORDER — MINOXIDIL 10 MG PO TABS: ORAL_TABLET | ORAL | 3 refills | Status: DC

## 2021-06-05 NOTE — Progress Notes (Signed)
Cardiology Office Note:    Date:  06/05/2021   ID:  Thomas [name], DOB 1951-04-07, MRN [id number]  PCP:  Michael Boston, MD  Cardiologist:  Jenne Campus, MD    Referring MD: Michael Boston, MD   Chief Complaint  Patient presents with   Hypertension    History of Present Illness:    Thomas [name] is a 70 y.o. male with past medical history significant for essential hypertension which appears to be difficult to control.  Also history of CVA, dyslipidemia.  Comes today 2 months of follow-up lately his blood pressures became problematic again.  We did have extensive evaluation before trying to look for secondary reason for his hypertension all negative.  Described to have some chronic pain in the side of his belly.  That being aggressively investigated so far investigation unrevealing  Past Medical History:  Diagnosis Date   Abdominal wall pain 04/02/2021   Acute ischemic stroke ([place]) 03/22/2020   Avulsed toenail, initial encounter 05/04/2018   Chronic gout    multiple sites--- followed by pcp--- (04-14-2018  per pt last flare-up , march 2019)   Dyspnea on exertion 02/10/2020   ED (erectile dysfunction)    Epidermoid cyst of neck 10/16/2017   Essential hypertension 07/21/2017   Gout 01/19/2014   Gout of multiple sites 07/21/2017   History of kidney stones    History of squamous cell carcinoma excision    Hyperlipidemia    Hypertension    Hypertension not at goal 01/19/2014   It band syndrome, left 03/19/2020   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/24/2020   Left ureteral calculus    Lower abdominal pain 08/27/2015   Metatarsalgia of both feet 03/19/2020   Multiple joint pain 07/16/2018   Pain in joint, shoulder region 12/11/2015   Preventative health care 11/14/2016   Right flank pain 04/02/2021   Right sided abdominal pain 01/14/2021   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   Urgency of urination    URI (upper respiratory infection) 11/08/2014   Wears glasses      Past Surgical History:  Procedure Laterality Date   COLONOSCOPY WITH PROPOFOL  2017   CYSTOSCOPY/RETROGRADE/URETEROSCOPY/STONE EXTRACTION WITH BASKET Left 04/16/2018   Procedure: CYSTOSCOPY/RETROGRADE/URETEROSCOPY/STONE EXTRACTION WITH BASKET/ HOLMIUM LASER LITHOTRIPSY/ STENT PLACEMENT;  Surgeon: Ardis Hughs, MD;  Location[facility];  Service: Urology;  Laterality: Left;   HOLMIUM LASER APPLICATION Left 6/56/8127   Procedure: HOLMIUM LASER APPLICATION;  Surgeon: Ardis Hughs, MD;  Location: [facility];  Service: Urology;  Laterality: Left;    Current Medications: Current Meds  Medication Sig   allopurinol (ZYLOPRIM) 300 MG tablet Take 300 mg by mouth daily.   aspirin EC 81 MG tablet Take 1 tablet (81 mg total) by mouth daily. Swallow whole.   colchicine 0.6 MG tablet Take 0.6 mg by mouth as needed (gout flare up).   gabapentin (NEURONTIN) 300 MG capsule Take 1 capsule (300 mg total) by mouth 2 (two) times daily.   hydrochlorothiazide (HYDRODIURIL) 25 MG tablet TAKE 1 TABLET BY MOUTH EVERY DAY (Patient taking differently: Take 25 mg by mouth daily.)   lidocaine (XYLOCAINE) 5 % ointment Apply 1 application topically as needed. (Patient taking differently: Apply 1 application topically as needed for mild pain or moderate pain.)   minoxidil (LONITEN) 2.5 MG tablet Take 3 tablets in the morning. Take 2 tablets at night. (Patient taking differently: Take 2.5 mg by mouth as directed. Take 3 tablets in  the morning. Take 2 tablets at night.)   pantoprazole (PROTONIX) 40 MG tablet Take 1 tablet (40 mg total) by mouth 2 (two) times daily.   traMADol (ULTRAM) 50 MG tablet Take 50 mg by mouth 2 (two) times daily as needed for severe pain or moderate pain.   valsartan (DIOVAN) 320 MG tablet TAKE 1 TABLET BY MOUTH EVERY DAY (Patient taking differently: Take 320 mg by mouth daily.)     Allergies:   Statins   Social History   Socioeconomic History    Marital status: Married    Spouse name: [name]   Number of children: 2   Years of education: Not on file   Highest education level: Not on file  Occupational History   Occupation: Pharmacist, hospital  Tobacco Use   Smoking status: Former    Packs/day: 0.30    Years: 20.00    Pack years: 6.00    Types: Cigarettes    Quit date: 10/17/1994    Years since quitting: 26.6   Smokeless tobacco: Never  Vaping Use   Vaping Use: Not on file  Substance and Sexual Activity   Alcohol use: Not Currently   Drug use: No   Sexual activity: Yes  Other Topics Concern   Not on file  Social History Narrative   Exercise 1 hour a day ---3-4 days a week   Social Determinants of Health   Financial Resource Strain: Not on file  Food Insecurity: Not on file  Transportation Needs: Not on file  Physical Activity: Not on file  Stress: Not on file  Social Connections: Not on file     Family History: The patient's family history includes Alcohol abuse in his mother; Cancer in his sister; Depression in his mother; Heart disease (age of onset: 83) in his mother; Hypertension in his father. There is no history of Colon cancer, Pancreatic cancer, Rectal cancer, Stomach cancer, Esophageal cancer, Inflammatory bowel disease, or Liver disease. ROS:   Please see the history of present illness.    All 14 point review of systems negative except as described per history of present illness  EKGs/Labs/Other Studies Reviewed:      Recent Labs: 01/14/2021: ALT 28; BUN 22; Creatinine, Ser 1.25; Hemoglobin 14.7; Platelets 287; Potassium 3.8; Sodium 135  Recent Lipid Panel    Component Value Date/Time   CHOL 215 (H) 06/22/2020 1629   TRIG 139 06/22/2020 1629   HDL 40 06/22/2020 1629   CHOLHDL 5.4 (H) 06/22/2020 1629   CHOLHDL 6.5 03/23/2020 0429   VLDL 55 (H) 03/23/2020 0429   LDLCALC 150 (H) 06/22/2020 1629    Physical Exam:    VS:  BP (!) 160/72 (BP Location: Right Arm, Patient Position: Sitting)   Pulse 96   Ht  5\' 11"  (1.803 m)   Wt 215 lb 1.9 oz (97.6 kg)   SpO2 96%   BMI 30.00 kg/m     Wt Readings from Last 3 Encounters:  06/05/21 215 lb 1.9 oz (97.6 kg)  05/29/21 214 lb 8 oz (97.3 kg)  04/17/21 212 lb (96.2 kg)     GEN:  Well nourished, well developed in no acute distress HEENT: Normal NECK: No JVD; No carotid bruits LYMPHATICS: No lymphadenopathy CARDIAC: RRR, no murmurs, no rubs, no gallops RESPIRATORY:  Clear to auscultation without rales, wheezing or rhonchi  ABDOMEN: Soft, non-tender, non-distended MUSCULOSKELETAL:  No edema; No deformity  SKIN: Warm and dry LOWER EXTREMITIES: no swelling NEUROLOGIC:  Alert and oriented x 3 PSYCHIATRIC:  Normal affect  ASSESSMENT:    1. Essential hypertension   2. Pure hypertriglyceridemia   3. Late effect of cerebrovascular accident (CVA)    PLAN:    In order of problems listed above:  Essential hypertension still difficult to control will increase dose of minoxidil to 50 mg daily, will check Chem-7 within the next week. Dyslipidemia I will ask him to have fasting lipid profile done again, he does have intolerance to statin. Late effects of CVA stable he seen neurologist he is on aspirin.   Medication Adjustments/Labs and Tests Ordered: Current medicines are reviewed at length with the patient today.  Concerns regarding medicines are outlined above.  No orders of the defined types were placed in this encounter.  Medication changes: No orders of the defined types were placed in this encounter.   Signed, Park Liter, MD, [facility] 06/05/2021 3:29 PM    [facility]

## 2021-06-05 NOTE — Patient Instructions (Signed)
Medication Instructions:  Your physician has recommended you make the following change in your medication:  INCREASE: Minoxidil 10 mg in the morning 5 mg at night *If you need a refill on your cardiac medications before your next appointment, please call your pharmacy*   Lab Work: Your physician recommends that you return for lab work in:  TOMORROW: BMET, Lipids - please come fasting If you have labs (blood work) drawn today and your tests are completely normal, you will receive your results only by: [facility] (if you have MyChart) OR A paper copy in the mail If you have any lab test that is abnormal or we need to change your treatment, we will call you to review the results.   Testing/Procedures: None   Follow-Up: At [facility], you and your health needs are our priority.  As part of our continuing mission to provide you with exceptional heart care, we have created designated Provider Care Teams.  These Care Teams include your primary Cardiologist (physician) and Advanced Practice Providers (APPs -  Physician Assistants and Nurse Practitioners) who all work together to provide you with the care you need, when you need it.  We recommend signing up for the patient portal called "MyChart".  Sign up information is provided on this After Visit Summary.  MyChart is used to connect with patients for Virtual Visits (Telemedicine).  Patients are able to view lab/test results, encounter notes, upcoming appointments, etc.  Non-urgent messages can be sent to your provider as well.   To learn more about what you can do with MyChart, go to [email or web address].    Your next appointment:   2 month(s)  The format for your next appointment:   In Person  Provider:   Jenne Campus, MD   Other Instructions

## 2021-06-07 DIAGNOSIS — E781 Pure hyperglyceridemia: Secondary | ICD-10-CM | POA: Diagnosis not present

## 2021-06-07 DIAGNOSIS — I10 Essential (primary) hypertension: Secondary | ICD-10-CM | POA: Diagnosis not present

## 2021-06-07 DIAGNOSIS — I6930 Unspecified sequelae of cerebral infarction: Secondary | ICD-10-CM | POA: Diagnosis not present

## 2021-06-08 LAB — LIPID PANEL
Chol/HDL Ratio: 4.9 ratio (ref 0.0–5.0)
Cholesterol, Total: 201 mg/dL — ABNORMAL HIGH (ref 100–199)
HDL: 41 mg/dL (ref >39)
LDL Chol Calc (NIH): 139 mg/dL — ABNORMAL HIGH (ref 0–99)
Triglycerides: 114 mg/dL (ref 0–149)
VLDL Cholesterol Cal: 21 mg/dL (ref 5–40)

## 2021-06-08 LAB — BASIC METABOLIC PANEL
BUN/Creatinine Ratio: 13 (ref 10–24)
BUN: 17 mg/dL (ref 8–27)
CO2: 25 mmol/L (ref 20–29)
Calcium: 9.6 mg/dL (ref 8.6–10.2)
Chloride: 100 mmol/L (ref 96–106)
Creatinine, Ser: 1.36 mg/dL — ABNORMAL HIGH (ref 0.76–1.27)
Glucose: 78 mg/dL (ref 65–99)
Potassium: 4.2 mmol/L (ref 3.5–5.2)
Sodium: 140 mmol/L (ref 134–144)
eGFR: 57 mL/min/{1.73_m2} — ABNORMAL LOW (ref >59)

## 2021-06-11 ENCOUNTER — Ambulatory Visit: Payer: Medicare HMO | Admitting: Neurology

## 2021-06-11 ENCOUNTER — Encounter: Payer: Self-pay | Admitting: Neurology

## 2021-06-11 VITALS — BP 156/72 | HR 82 | Ht 71.0 in | Wt 217.8 lb

## 2021-06-11 DIAGNOSIS — G4719 Other hypersomnia: Secondary | ICD-10-CM

## 2021-06-11 DIAGNOSIS — R0683 Snoring: Secondary | ICD-10-CM | POA: Diagnosis not present

## 2021-06-11 DIAGNOSIS — R351 Nocturia: Secondary | ICD-10-CM | POA: Diagnosis not present

## 2021-06-11 DIAGNOSIS — R0681 Apnea, not elsewhere classified: Secondary | ICD-10-CM

## 2021-06-11 DIAGNOSIS — Z9189 Other specified personal risk factors, not elsewhere classified: Secondary | ICD-10-CM | POA: Diagnosis not present

## 2021-06-11 DIAGNOSIS — I639 Cerebral infarction, unspecified: Secondary | ICD-10-CM | POA: Diagnosis not present

## 2021-06-11 DIAGNOSIS — I6381 Other cerebral infarction due to occlusion or stenosis of small artery: Secondary | ICD-10-CM

## 2021-06-11 DIAGNOSIS — G8929 Other chronic pain: Secondary | ICD-10-CM | POA: Diagnosis not present

## 2021-06-11 NOTE — Patient Instructions (Signed)
It was nice to see you again today.   As discussed, I will request a sleep study to be authorized through your insurance.  We will call you to schedule your test.  Please remember, the long-term risks and ramifications of untreated moderate to severe obstructive sleep apnea are: increased Cardiovascular disease, including congestive heart failure, stroke, difficult to control hypertension, treatment resistant obesity, arrhythmias, especially irregular heartbeat commonly known as A. Fib. (atrial fibrillation); even type 2 diabetes has been linked to untreated OSA.   Sleep apnea can cause disruption of sleep and sleep deprivation in most cases, which, in turn, can cause recurrent headaches, problems with memory, mood, concentration, focus, and vigilance. Most people with untreated sleep apnea report excessive daytime sleepiness, which can affect their ability to drive. Please do not drive if you feel sleepy. Patients with sleep apnea can also develop difficulty initiating and maintaining sleep (aka insomnia).   Having sleep apnea may increase your risk for other sleep disorders, including involuntary behaviors sleep such as sleep terrors, sleep talking, sleepwalking.    Having sleep apnea can also increase your risk for restless leg syndrome and leg movements at night.   Please note that untreated obstructive sleep apnea may carry additional perioperative morbidity. Patients with significant obstructive sleep apnea (typically, in the moderate to severe degree) should receive, if possible, perioperative PAP (positive airway pressure) therapy and the surgeons and particularly the anesthesiologists should be informed of the diagnosis and the severity of the sleep disordered breathing.  We will plan a follow-up after testing, we will keep you posted as to your results by phone call as well.

## 2021-06-11 NOTE — Progress Notes (Signed)
Subjective:    Patient ID: Thomas [name] is a 70 y.o. male.  HPI    Interim history:   Thomas [name] is a 70 year old right-handed gentleman with an underlying medical history of hypertension, hyperlipidemia, left thalamic stroke, gout, kidney stones, squamous cell skin cancer, chronic pain, and borderline obesity, who presents for reevaluation of his Sleep disturbance, concern for underlying obstructive sleep apnea.  The patient is unaccompanied today.  I first met him at the request of Frann Rider, NP and Dr. Leonie Man on 05/28/2020 at which time he reported snoring and witnessed apneas per wife's report.  He was advised to proceed with a sleep study.  He did not pursue testing at the time.   Today, 06/11/2021: He reports that he did not pursue testing last year because of chronic pain keeping him up at night.  He has a history of pain affecting the right side of his abdominal wall and flank area.  He has had medication for this including gabapentin and tramadol and while it holds him steady and let him sleep fairly decently through the night, the pain is still there and bothersome.  He has not had a resolution as to the etiology of his pain.  His Epworth sleepiness score is 8 out of 24, fatigue severity score is 63 out of 63.  His snoring is loud and bothersome to his wife, she has noted pauses and longer pauses recently in his breathing pattern while he is asleep which causes her anxiety and worry.  He drinks caffeine in the form of coffee, 1 or 2 cups in the mornings and 1 or 2 glasses of sweet tea during the day.  He has a family history of sleep apnea, his father had a CPAP machine.  Bedtime is currently around 10 and rise time around 630.  He works as an Tourist information centre manager, works full-time in admissions.  He has nocturia about once or twice per average night and denies recurrent morning headaches, weight has been plus or minus stable.  The patient's allergies, current medications, family history, past medical  history, past social history, past surgical history and problem list were reviewed and updated as appropriate.   Previously:   05/28/20: (He) reports snoring and witnessed apneas per wife's report.  He has had difficulty control hypertension for a few years.  He also suffers from chronic gout and intermittent flareup of his gout.  His father had sleep apnea and used a CPAP machine in his 11s.  I reviewed your office note from 04/24/2020.  His Epworth sleepiness score is 3 out of 24 today, fatigue severity score is 27 out of 63. Patient lives with his wife, he works as Engineer, site for a private school.  He smoked off and on in his 44s, quit 35 years ago.  He has 2 grown children, 32 year old son is in college, he has a 60 year old as well.  He has nocturia about 2-3 times per average night, denies recurrent morning headaches.  He has a bedtime of around 10, rise time around 5.  He drinks caffeine in the form of coffee, 2 cups/day on average in the mornings, no alcohol in the recent past.  He is still in physical therapy.  His strength is fairly good on the right side, still has some sensory symptoms particularly in the right leg. He is a side sleeper.  They do have a TV in the bedroom but he does not have to watch it at night, likes to read before falling  asleep.  They have a dog in the household.  His Past Medical History Is Significant For: Past Medical History:  Diagnosis Date   Abdominal wall pain 04/02/2021   Acute ischemic stroke ([place]) 03/22/2020   Avulsed toenail, initial encounter 05/04/2018   Chronic gout    multiple sites--- followed by pcp--- (04-14-2018  per pt last flare-up , march 2019)   Dyspnea on exertion 02/10/2020   ED (erectile dysfunction)    Epidermoid cyst of neck 10/16/2017   Essential hypertension 07/21/2017   Gout 01/19/2014   Gout of multiple sites 07/21/2017   History of kidney stones    History of squamous cell carcinoma excision    Hyperlipidemia    Hypertension     Hypertension not at goal 01/19/2014   It band syndrome, left 03/19/2020   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/24/2020   Left ureteral calculus    Lower abdominal pain 08/27/2015   Metatarsalgia of both feet 03/19/2020   Multiple joint pain 07/16/2018   Pain in joint, shoulder region 12/11/2015   Preventative health care 11/14/2016   Right flank pain 04/02/2021   Right sided abdominal pain 01/14/2021   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   Urgency of urination    URI (upper respiratory infection) 11/08/2014   Wears glasses     His Past Surgical History Is Significant For: Past Surgical History:  Procedure Laterality Date   COLONOSCOPY WITH PROPOFOL  2017   CYSTOSCOPY/RETROGRADE/URETEROSCOPY/STONE EXTRACTION WITH BASKET Left 04/16/2018   Procedure: CYSTOSCOPY/RETROGRADE/URETEROSCOPY/STONE EXTRACTION WITH BASKET/ HOLMIUM LASER LITHOTRIPSY/ STENT PLACEMENT;  Surgeon: Ardis Hughs, MD;  Location: [facility];  Service: Urology;  Laterality: Left;   HOLMIUM LASER APPLICATION Left 2/40/9735   Procedure: HOLMIUM LASER APPLICATION;  Surgeon: Ardis Hughs, MD;  Location: [facility];  Service: Urology;  Laterality: Left;    His Family History Is Significant For: Family History  Problem Relation Age of Onset   Heart disease Mother 33   Alcohol abuse Mother    Depression Mother    Cancer Sister        hodgkins, breast   Hypertension Father    Colon cancer Neg Hx    Pancreatic cancer Neg Hx    Rectal cancer Neg Hx    Stomach cancer Neg Hx    Esophageal cancer Neg Hx    Inflammatory bowel disease Neg Hx    Liver disease Neg Hx     His Social History Is Significant For: Social History   Socioeconomic History   Marital status: Married    Spouse name: Thomas [name]   Number of children: 2   Years of education: Not on file   Highest education level: Not on file  Occupational History   Occupation: Pharmacist, hospital  Tobacco Use    Smoking status: Former    Packs/day: 0.30    Years: 20.00    Pack years: 6.00    Types: Cigarettes    Quit date: 10/17/1994    Years since quitting: 26.6   Smokeless tobacco: Never  Vaping Use   Vaping Use: Not on file  Substance and Sexual Activity   Alcohol use: Not Currently   Drug use: No   Sexual activity: Yes  Other Topics Concern   Not on file  Social History Narrative   Exercise 1 hour a day ---3-4 days a week   Social Determinants of Health   Financial Resource Strain: Not on file  Food Insecurity: Not on file  Transportation  Needs: Not on file  Physical Activity: Not on file  Stress: Not on file  Social Connections: Not on file    His Allergies Are:  Allergies  Allergen Reactions   Statins     Jaw tightness and severe muscle pain- Pravastatin   :   His Current Medications Are:  Outpatient Encounter Medications as of 06/11/2021  Medication Sig   allopurinol (ZYLOPRIM) 300 MG tablet Take 300 mg by mouth daily.   aspirin EC 81 MG tablet Take 1 tablet (81 mg total) by mouth daily. Swallow whole.   colchicine 0.6 MG tablet Take 0.6 mg by mouth as needed (gout flare up).   gabapentin (NEURONTIN) 300 MG capsule Take 1 capsule (300 mg total) by mouth 2 (two) times daily.   hydrochlorothiazide (HYDRODIURIL) 25 MG tablet TAKE 1 TABLET BY MOUTH EVERY DAY (Patient taking differently: Take 25 mg by mouth daily.)   lidocaine (XYLOCAINE) 5 % ointment Apply 1 application topically as needed. (Patient taking differently: Apply 1 application topically as needed for mild pain or moderate pain.)   minoxidil (LONITEN) 10 MG tablet Take 10 mg in the morning, take 5 mg at night.   traMADol (ULTRAM) 50 MG tablet Take 50 mg by mouth 2 (two) times daily as needed for severe pain or moderate pain.   valsartan (DIOVAN) 320 MG tablet TAKE 1 TABLET BY MOUTH EVERY DAY (Patient taking differently: Take 320 mg by mouth daily.)   pantoprazole (PROTONIX) 40 MG tablet Take 1 tablet (40 mg total)  by mouth 2 (two) times daily.   [DISCONTINUED] topiramate (TOPAMAX) 25 MG tablet Take 1 tablet (25 mg total) by mouth 2 (two) times daily. (Patient not taking: Reported on 06/05/2021)   No facility-administered encounter medications on file as of 06/11/2021.  :  Review of Systems:  Out of a complete 14 point review of systems, all are reviewed and negative with the exception of these symptoms as listed below:   Review of Systems  Neurological:        Proceed with Sleep study (did not have done when ordered as was trying to get pain managed), this is still issue so wanted to proceed. Epworth Sleepiness Scale 0= would never doze 1= slight chance of dozing 2= moderate chance of dozing 3= high chance of dozing  Sitting and reading:2 Watching TV:2 Sitting inactive in a public place (ex. Theater or meeting):0 As a passenger in a car for an hour without a break:1 Lying down to rest in the afternoon:3 Sitting and talking to someone:0 Sitting quietly after lunch (no alcohol):0 In a car, while stopped in traffic:0 Total: 8    Objective:  Neurological Exam  Physical Exam Physical Examination:   Vitals:   06/11/21 1242  BP: (!) 156/72  Pulse: 82    General Examination: The patient is a very pleasant 70 y.o. male in no acute distress. He appears well-developed and well-nourished and well groomed.   HEENT: Normocephalic, atraumatic, pupils are equal, round and reactive to light, extraocular tracking is good without limitation to gaze excursion or nystagmus noted. Hearing is grossly intact. Face is symmetric with normal facial animation. Speech is clear with no dysarthria noted. There is no hypophonia. There is no lip, neck/head, jaw or voice tremor. Neck is supple with full range of passive and active motion. There are no carotid bruits on auscultation. Oropharynx exam reveals: mild mouth dryness, adequate dental hygiene and moderate airway crowding, tonsils are 1+.  Mallampati is class  II.  Tongue  protrudes centrally in palate elevates symmetrically.  Neck circumference of 16-1/2 inches.  He has a mild overbite.  He has crowns in the front.   Chest: Clear to auscultation without wheezing, rhonchi or crackles noted.   Heart: S1+S2+0, regular and normal without murmurs, rubs or gallops noted.   Abdomen: Soft, non-tender and non-distended.   Extremities: There is no pitting edema in the distal lower extremities bilaterally.   Skin: Warm and dry without trophic changes noted.   Musculoskeletal: exam reveals no obvious joint deformities.   Neurologically: Mental status: The patient is awake, alert and oriented in all 4 spheres. His immediate and remote memory, attention, language skills and fund of knowledge are appropriate. There is no evidence of aphasia, agnosia, apraxia or anomia. Speech is clear with normal prosody and enunciation. Thought process is linear. [name] is normal and affect is normal. Cranial nerves II - XII are as described above under HEENT exam. Motor exam: Normal bulk, strength and tone is noted. Fine motor skills and coordination: grossly intact. Cerebellar testing: No dysmetria or intention tremor. There is no truncal or gait ataxia. Sensory exam: intact to light touch in the upper and lower extremities.  Reports some discomfort in the right thigh area, stable.   Assessment and Plan:  In summary, DEMETRIA LIGHTSEY is a very pleasant 70 year old male with an underlying medical history of hypertension, hyperlipidemia, left thalamic stroke, gout, kidney stones, squamous cell skin cancer, chronic pain, and borderline obesity, who presents for re-evaluation of his sleep disturbance and concern for underlying obstructive sleep apnea.  We discussed his symptoms at evaluation again today.  We talked about treatment options for obstructive sleep apnea.  He is willing to pursue testing at this time.  I will order a sleep study and we will call him to schedule his testing  once we have insurance authorization.  We will pick up our discussion about treatment options after testing results are available.  We will keep him posted as to his test results by phone call as well.  I answered all his questions today and he was in agreement.

## 2021-06-20 DIAGNOSIS — D171 Benign lipomatous neoplasm of skin and subcutaneous tissue of trunk: Secondary | ICD-10-CM | POA: Diagnosis not present

## 2021-06-20 DIAGNOSIS — K4090 Unilateral inguinal hernia, without obstruction or gangrene, not specified as recurrent: Secondary | ICD-10-CM | POA: Diagnosis not present

## 2021-06-21 ENCOUNTER — Encounter: Payer: Self-pay | Admitting: Gastroenterology

## 2021-06-24 ENCOUNTER — Telehealth: Payer: Self-pay

## 2021-06-24 NOTE — Telephone Encounter (Signed)
I submitted a PA request for Lidocaine 5% ointment on CMM, Key: [id number] - PA Case ID: [id number]   Your information has been submitted to [place] [employer] Part D. [employer] Part D will review the request and will issue a decision, typically within 1-3 days from your submission.

## 2021-06-24 NOTE — Telephone Encounter (Signed)
Approved.  This approval authorizes your coverage from 11/17/2020 - 09/22/2021

## 2021-07-06 ENCOUNTER — Other Ambulatory Visit: Payer: Self-pay | Admitting: Cardiology

## 2021-07-29 ENCOUNTER — Ambulatory Visit (INDEPENDENT_AMBULATORY_CARE_PROVIDER_SITE_OTHER): Payer: Medicare HMO | Admitting: Neurology

## 2021-07-29 DIAGNOSIS — I6381 Other cerebral infarction due to occlusion or stenosis of small artery: Secondary | ICD-10-CM

## 2021-07-29 DIAGNOSIS — Z9189 Other specified personal risk factors, not elsewhere classified: Secondary | ICD-10-CM

## 2021-07-29 DIAGNOSIS — G4733 Obstructive sleep apnea (adult) (pediatric): Secondary | ICD-10-CM | POA: Diagnosis not present

## 2021-07-29 DIAGNOSIS — I639 Cerebral infarction, unspecified: Secondary | ICD-10-CM

## 2021-07-29 DIAGNOSIS — G4719 Other hypersomnia: Secondary | ICD-10-CM

## 2021-07-29 DIAGNOSIS — G8929 Other chronic pain: Secondary | ICD-10-CM

## 2021-07-29 DIAGNOSIS — R0681 Apnea, not elsewhere classified: Secondary | ICD-10-CM

## 2021-07-29 DIAGNOSIS — R0683 Snoring: Secondary | ICD-10-CM

## 2021-07-29 DIAGNOSIS — R351 Nocturia: Secondary | ICD-10-CM

## 2021-08-01 ENCOUNTER — Other Ambulatory Visit: Payer: Self-pay | Admitting: Gastroenterology

## 2021-08-02 ENCOUNTER — Other Ambulatory Visit: Payer: Self-pay

## 2021-08-02 ENCOUNTER — Encounter: Payer: Self-pay | Admitting: Physical Medicine & Rehabilitation

## 2021-08-02 ENCOUNTER — Encounter: Payer: Medicare HMO | Attending: Physical Medicine & Rehabilitation | Admitting: Physical Medicine & Rehabilitation

## 2021-08-02 VITALS — BP 198/80 | HR 79 | Temp 98.1°F | Ht 71.0 in | Wt 224.4 lb

## 2021-08-02 DIAGNOSIS — G8929 Other chronic pain: Secondary | ICD-10-CM | POA: Diagnosis not present

## 2021-08-02 DIAGNOSIS — M25551 Pain in right hip: Secondary | ICD-10-CM | POA: Diagnosis not present

## 2021-08-02 DIAGNOSIS — G588 Other specified mononeuropathies: Secondary | ICD-10-CM | POA: Insufficient documentation

## 2021-08-02 NOTE — Progress Notes (Signed)
See procedure note.

## 2021-08-02 NOTE — Addendum Note (Signed)
Addended by: Star Age on: 08/02/2021 11:50 AM   Modules accepted: Orders

## 2021-08-02 NOTE — Progress Notes (Signed)
Subjective:    Patient ID: Thomas [name], male    DOB: 04/22/1951, 70 y.o.   MRN: [id number]  HPI  Chief complaints pain in the right side 70 year old male with history of CVA and gout who has greater than 1 year history of right-sided pain describes it as sharp and aching worse in the evening worse with walking bending and standing improves proving with rest and medication.  The patient first noted his symptoms after his stroke, a small left thalamic infarct onset on 03/22/2020.  Patient feels his symptoms have progressed over the last couple months.  He has a walking tolerance that is down to about 5 minutes.  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  The patient has been evaluated by general surgery and a small inguinal hernia was detected and he is planning surgery next week. He has previously undergone GI work-up with nuclear medicine scan and ultrasound of the abdomen which did not demonstrate any pathology. CT of the abdomen pelvis as well as urology evaluation did not detect any kidney stones or other urologic issues. The patient has been evaluated by neurology who feels that this is not a stroke related event but more of a focal neuropathy.  MRI of the thoracic and lumbar spine on 04/14/2021 did not demonstrate any evidence of neural compression The patient denies any falls or trauma to the area. He has undergone evaluation by sports medicine The patient states he has fair pain relief on a combination of tramadol in the morning gabapentin twice in the evening as well as lidocaine ointment as needed Pain Inventory Average Pain 6 Pain Right Now 9 My pain is sharp, aching, and pressure  In the last 24 hours, has pain interfered with the following? General activity 9 Relation with others 9 Enjoyment of life 9 What TIME of day is your pain at its worst? evening Sleep (in general) Poor  Pain is worse with: walking, bending,  standing, and some activites Pain improves with: rest, heat/ice, and medication Relief from Meds: 5  how many minutes can you walk? 5 ability to climb steps?  yes do you drive?  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  numbness spasms  New pt  New pt    Family History  Problem Relation Age of Onset   Heart disease Mother 48   Alcohol abuse Mother    Depression Mother    Cancer Sister        hodgkins, breast   Hypertension Father    Colon cancer Neg Hx    Pancreatic cancer Neg Hx    Rectal cancer Neg Hx    Stomach cancer Neg Hx    Esophageal cancer Neg Hx    Inflammatory bowel disease Neg Hx    Liver disease Neg Hx    Social History   Socioeconomic History   Marital status: Married    Spouse name: [name]   Number of children: 2   Years of education: Not on file   Highest education level: Not on file  Occupational History   Occupation: Pharmacist, hospital  Tobacco Use   Smoking status: Former    Packs/day: 0.30    Years: 20.00    Pack years: 6.00    Types: Cigarettes    Quit date: 10/17/1994    Years since quitting: 26.8   Smokeless tobacco: Never  Vaping Use   Vaping Use:  Not on file  Substance and Sexual Activity   Alcohol use: Not Currently   Drug use: No   Sexual activity: Yes  Other Topics Concern   Not on file  Social History Narrative   Exercise 1 hour a day ---3-4 days a week   Social Determinants of Health   Financial Resource Strain: Not on file  Food Insecurity: Not on file  Transportation Needs: Not on file  Physical Activity: Not on file  Stress: Not on file  Social Connections: Not on file   Past Surgical History:  Procedure Laterality Date   COLONOSCOPY WITH PROPOFOL  2017   CYSTOSCOPY/RETROGRADE/URETEROSCOPY/STONE EXTRACTION WITH BASKET Left 04/16/2018   Procedure: CYSTOSCOPY/RETROGRADE/URETEROSCOPY/STONE EXTRACTION WITH BASKET/ HOLMIUM LASER LITHOTRIPSY/ STENT PLACEMENT;  Surgeon: Ardis Hughs, MD;   Location: [facility];  Service: Urology;  Laterality: Left;   HOLMIUM LASER APPLICATION Left 9/37/3428   Procedure: HOLMIUM LASER APPLICATION;  Surgeon: Ardis Hughs, MD;  [facility];  Service: Urology;  Laterality: Left;   Past Medical History:  Diagnosis Date   Abdominal wall pain 04/02/2021   Acute ischemic stroke ([place]) 03/22/2020   Avulsed toenail, initial encounter 05/04/2018   Chronic gout    multiple sites--- followed by pcp--- (04-14-2018  per pt last flare-up , march 2019)   Dyspnea on exertion 02/10/2020   ED (erectile dysfunction)    Epidermoid cyst of neck 10/16/2017   Essential hypertension 07/21/2017   Gout 01/19/2014   Gout of multiple sites 07/21/2017   History of kidney stones    History of squamous cell carcinoma excision    Hyperlipidemia    Hypertension    Hypertension not at goal 01/19/2014   It band syndrome, left 03/19/2020   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/24/2020   Left ureteral calculus    Lower abdominal pain 08/27/2015   Metatarsalgia of both feet 03/19/2020   Multiple joint pain 07/16/2018   Pain in joint, shoulder region 12/11/2015   Preventative health care 11/14/2016   Right flank pain 04/02/2021   Right sided abdominal pain 01/14/2021   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   Urgency of urination    URI (upper respiratory infection) 11/08/2014   Wears glasses    BP (!) 198/80   Pulse 79   Temp 98.1 F (36.7 C) (Oral)   Ht 5\' 11"  (1.803 m)   Wt 224 lb 6.4 oz (101.8 kg)   SpO2 98%   BMI 31.30 kg/m   Opioid Risk Score:   Fall Risk Score:  `1  Depression screen PHQ 2/9  Depression screen [facility] 2/9 08/02/2021 01/25/2020 11/14/2016 01/19/2014  Decreased Interest 0 0 0 0  Down, Depressed, Hopeless 0 0 0 0  PHQ - 2 Score 0 0 0 0  Altered sleeping 0 - - -  Tired, decreased energy 0 - - -  Change in appetite 0 - - -  Feeling bad or failure about yourself  1 - - -  Trouble  concentrating 0 - - -  Moving slowly or fidgety/restless 0 - - -  Suicidal thoughts 0 - - -  PHQ-9 Score 1 - - -  Difficult doing work/chores Somewhat difficult - - -      Review of Systems  Gastrointestinal:  Positive for nausea.  Musculoskeletal:  Positive for back pain.       Spasms Side pain Leg pain Knee pain  Neurological:  Positive for numbness.  All other systems reviewed and are  negative.     Objective:   Physical Exam Vitals and nursing note reviewed.  Constitutional:      Appearance: He is obese.  HENT:     Head: Normocephalic and atraumatic.  Eyes:     Extraocular Movements: Extraocular movements intact.     Conjunctiva/sclera: Conjunctivae normal.     Pupils: Pupils are equal, round, and reactive to light.  Cardiovascular:     Rate and Rhythm: Normal rate and regular rhythm.     Heart sounds: Normal heart sounds.  Pulmonary:     Effort: Pulmonary effort is normal. No respiratory distress.     Breath sounds: Normal breath sounds.  Abdominal:     General: Abdomen is flat. Bowel sounds are normal. There is no distension.     Palpations: Abdomen is soft.  Musculoskeletal:     Comments: No pain with lumbar flexion, extension, lateral bending and rotation. There is pain with passive hip extension, pain with supine in the anterior hip region Negative femoral stretch test Negative straight leg raising test   Skin:    General: Skin is warm and dry.     Comments: No evidence of dermatomal rash around the right flank  Neurological:     Mental Status: He is alert and oriented to person, place, and time.     Gait: Gait normal.     Deep Tendon Reflexes: Reflexes normal.     Comments: There is hypersensitivity to touch over the right inguinal area. Motor strength is 5/5 bilateral deltoid, bicep, tricep, grip, hip flexor, knee extensor, ankle dorsiflexion plantarflexion   Psychiatric:        Mood and Affect: Mood normal.  Tenderness palpation along the lower  margin of the right 11th or 12th rib, difficult to visualize due to body habitus.        Assessment & Plan:  1.  Multifactorial pain, appears to have both inguinal pain which would correspond to nerve distribution of the iliohypogastric nerve although this is complicated by a small hernia in a similar location.  He is undergoing hernia repair next week.  We will see how this pain responds postoperatively. 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  This appears to be at the lower rib region.  We will set him up for intercostal nerve block under ultrasound guidance. In addition patient has some pain in the groin that could be attributed to the hip joint we will check x-rays although he does not have any pain with weightbearing.

## 2021-08-02 NOTE — Procedures (Signed)
   [facility]  HOME SLEEP TEST (Watch PAT) REPORT  STUDY DATE: 07/29/2021  DOB: [birth year]  MRN: [id number]  ORDERING CLINICIAN: Star Age, MD, PhD   REFERRING CLINICIAN: Dr. Leonie Man  CLINICAL INFORMATION/HISTORY: 70 year old right-handed gentleman with an underlying medical history of hypertension, hyperlipidemia, left thalamic stroke, gout, kidney stones, squamous cell skin cancer, chronic pain, and borderline obesity, who reports snoring and witnessed breathing pauses while asleep.    Epworth sleepiness score: 3/24.  BMI: 30.6 kg/m  FINDINGS:   Sleep Summary:   Total Recording Time (hours, min): 9 hours, 9 minutes  Total Sleep Time (hours, min):  7 hours, 45 minutes   Percent REM (%):    23.8%   Respiratory Indices:   Calculated pAHI (per hour):  35.3/hour         REM pAHI:    36.4/hour       NREM pAHI: 34.9/hour  Oxygen Saturation Statistics:    Oxygen Saturation (%) Mean: 94%   Minimum oxygen saturation (%):                 76%   O2 Saturation Range (%): 76-100%    O2 Saturation (minutes) <=88%: 0.3 min  Pulse Rate Statistics:   Pulse Mean (bpm):    65/min    Pulse Range (51-153/min (likely error))   IMPRESSION: OSA (obstructive sleep apnea)   RECOMMENDATION:  This home sleep test demonstrates severe obstructive sleep apnea with a total AHI of 35.3/hour and O2 nadir of 76%.  Snoring was detected, ranging primarily in the mild to moderate range.  Treatment with positive airway pressure is highly recommended. This will require - ideally - a full night CPAP titration study for proper treatment settings, O2 monitoring and mask fitting. For now, the patient will be advised to proceed with an autoPAP titration/trial at home. A laboratory-attended titration study can be considered in the future for optimization of his treatment and better tolerance of therapy.  Alternative treatment options are limited secondary to the severity of the  patient's sleep disordered breathing but may include a dental device or surgical options.  Concomitant weight loss is recommended.  Please note, that untreated obstructive sleep apnea may carry additional perioperative morbidity. Patients with significant obstructive sleep apnea should receive perioperative PAP therapy and the surgeons and particularly the anesthesiologist should be informed of the diagnosis and the severity of the sleep disordered breathing. The patient should be cautioned not to drive, work at heights, or operate dangerous or heavy equipment when tired or sleepy. Review and reiteration of good sleep hygiene measures should be pursued with any patient. Other causes of the patient's symptoms, including circadian rhythm disturbances, an underlying mood disorder, medication effect and/or an underlying medical problem cannot be ruled out based on this test. Clinical correlation is recommended. The patient and his referring provider will be notified of the test results. The patient will be seen in follow up in sleep clinic at [facility].  I certify that I have reviewed the raw data recording prior to the issuance of this report in accordance with the standards of the American Academy of Sleep Medicine (AASM).  INTERPRETING PHYSICIAN:   Star Age, MD, PhD  Board Certified in Neurology and Sleep Medicine  [facility] [address] [place], [place] [address] [phone]

## 2021-08-03 ENCOUNTER — Encounter: Payer: Self-pay | Admitting: Neurology

## 2021-08-05 NOTE — Telephone Encounter (Signed)
Star Age, MD  08/02/2021 11:50 AM EDT     Patient referred by Dr. Leonie Man, seen by me on 06/11/21, patient had a HST on 07/29/21.     Please call and notify the patient that the recent home sleep test showed obstructive sleep apnea in the severe range. I recommend treatment in the form of autoPAP, which means, that we don't have to bring him in for a sleep study with CPAP, but will let him start using a so called autoPAP machine at home, which is a CPAP-like machine with self-adjusting pressures. We will send the order to a local DME company (of his choice, or as per insurance requirement). The DME representative will fit him with a mask, educate him on how to use the machine, how to put the mask on, etc. I have placed an order in the chart. Please send the order, talk to patient, send report to referring MD. We will need a FU in sleep clinic for 10 weeks post-PAP set up, please arrange that with me or one of our NPs. Also reinforce the need for compliance with treatment. Thanks,    Star Age, MD, PhD [facility])

## 2021-08-06 NOTE — Telephone Encounter (Signed)
CM sent for new order on cpap.  Denyse Amass, RN Got it!   Thanks!

## 2021-08-07 ENCOUNTER — Other Ambulatory Visit: Payer: Self-pay | Admitting: Surgery

## 2021-08-07 DIAGNOSIS — D171 Benign lipomatous neoplasm of skin and subcutaneous tissue of trunk: Secondary | ICD-10-CM | POA: Diagnosis not present

## 2021-08-07 DIAGNOSIS — K4090 Unilateral inguinal hernia, without obstruction or gangrene, not specified as recurrent: Secondary | ICD-10-CM | POA: Diagnosis not present

## 2021-08-14 DIAGNOSIS — G5790 Unspecified mononeuropathy of unspecified lower limb: Secondary | ICD-10-CM | POA: Diagnosis not present

## 2021-08-14 DIAGNOSIS — R109 Unspecified abdominal pain: Secondary | ICD-10-CM | POA: Diagnosis not present

## 2021-09-03 ENCOUNTER — Ambulatory Visit: Payer: Medicare HMO | Admitting: Physical Medicine & Rehabilitation

## 2021-09-26 DIAGNOSIS — R109 Unspecified abdominal pain: Secondary | ICD-10-CM | POA: Diagnosis not present

## 2021-09-26 DIAGNOSIS — G5790 Unspecified mononeuropathy of unspecified lower limb: Secondary | ICD-10-CM | POA: Diagnosis not present

## 2021-09-27 ENCOUNTER — Other Ambulatory Visit: Payer: Self-pay | Admitting: Medical

## 2021-10-11 ENCOUNTER — Encounter: Payer: Self-pay | Admitting: Family Medicine

## 2021-10-13 ENCOUNTER — Encounter: Payer: Self-pay | Admitting: Cardiology

## 2021-10-14 DIAGNOSIS — R079 Chest pain, unspecified: Secondary | ICD-10-CM | POA: Diagnosis not present

## 2021-10-14 DIAGNOSIS — R9431 Abnormal electrocardiogram [ECG] [EKG]: Secondary | ICD-10-CM | POA: Diagnosis not present

## 2021-10-14 DIAGNOSIS — M79604 Pain in right leg: Secondary | ICD-10-CM | POA: Diagnosis not present

## 2021-10-14 DIAGNOSIS — Z5181 Encounter for therapeutic drug level monitoring: Secondary | ICD-10-CM | POA: Diagnosis not present

## 2021-10-14 DIAGNOSIS — M79651 Pain in right thigh: Secondary | ICD-10-CM | POA: Diagnosis not present

## 2021-10-14 DIAGNOSIS — Z79899 Other long term (current) drug therapy: Secondary | ICD-10-CM | POA: Diagnosis not present

## 2021-10-14 DIAGNOSIS — Z5329 Procedure and treatment not carried out because of patient's decision for other reasons: Secondary | ICD-10-CM | POA: Diagnosis not present

## 2021-10-14 DIAGNOSIS — R1030 Lower abdominal pain, unspecified: Secondary | ICD-10-CM | POA: Diagnosis not present

## 2021-10-15 DIAGNOSIS — G4733 Obstructive sleep apnea (adult) (pediatric): Secondary | ICD-10-CM | POA: Diagnosis not present

## 2021-10-16 ENCOUNTER — Telehealth: Payer: Self-pay | Admitting: Neurology

## 2021-10-16 DIAGNOSIS — R109 Unspecified abdominal pain: Secondary | ICD-10-CM | POA: Diagnosis not present

## 2021-10-16 DIAGNOSIS — M1611 Unilateral primary osteoarthritis, right hip: Secondary | ICD-10-CM | POA: Diagnosis not present

## 2021-10-16 DIAGNOSIS — M25551 Pain in right hip: Secondary | ICD-10-CM | POA: Diagnosis not present

## 2021-10-16 NOTE — Telephone Encounter (Signed)
Pt was scheduled for Initial CPAP visit on 01/09/22. Pt was informed to bring machine and power cord to appt.  DME: [facility] Phone: [phone] Fax: [phone] Equipment issued: Wynonia Musty G3 Pt to be scheduled between: 11/14/21-01/13/22

## 2021-10-16 NOTE — Telephone Encounter (Signed)
LVM for pt to call and schedule Initial CPAP.

## 2021-10-19 ENCOUNTER — Encounter: Payer: Self-pay | Admitting: Gastroenterology

## 2021-10-21 ENCOUNTER — Ambulatory Visit: Payer: Medicare HMO | Admitting: Family Medicine

## 2021-10-21 VITALS — BP 172/70 | Ht 71.0 in | Wt 218.0 lb

## 2021-10-21 DIAGNOSIS — M25551 Pain in right hip: Secondary | ICD-10-CM | POA: Diagnosis not present

## 2021-10-21 NOTE — Patient Instructions (Signed)
You have a hip flexor strain with mild underlying arthritis. This should respond really well to physical therapy. Do home exercises on days you don't go to therapy. Tylenol, tramadol as needed. Follow up with me in about 6 weeks.

## 2021-10-22 ENCOUNTER — Encounter: Payer: Self-pay | Admitting: Family Medicine

## 2021-10-22 NOTE — Progress Notes (Signed)
PCP: Michael Boston, MD  Subjective:   HPI: Patient is a 70 y.o. male here for right hip pain.  Patient reports he's developed severe worsening right anterior hip pain with radiation into the thigh over past 2+ weeks. He went to ED and had negative doppler ultrasound. Recently had x-rays from ortho showing mild arthritis. Pain has improved some in past week. Some benefit using bike and doing stairs. Today didn't need to take tylenol or tramadol he usually takes. Pain is worst in late afternoon. Set up appointment with [place] physical therapy for Friday.  Past Medical History:  Diagnosis Date   Abdominal wall pain 04/02/2021   Acute ischemic stroke ([place]) 03/22/2020   Avulsed toenail, initial encounter 05/04/2018   Chronic gout    multiple sites--- followed by pcp--- (04-14-2018  per pt last flare-up , march 2019)   Dyspnea on exertion 02/10/2020   ED (erectile dysfunction)    Epidermoid cyst of neck 10/16/2017   Essential hypertension 07/21/2017   Gout 01/19/2014   Gout of multiple sites 07/21/2017   History of kidney stones    History of squamous cell carcinoma excision    Hyperlipidemia    Hypertension    Hypertension not at goal 01/19/2014   It band syndrome, left 03/19/2020   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/24/2020   Left ureteral calculus    Lower abdominal pain 08/27/2015   Metatarsalgia of both feet 03/19/2020   Multiple joint pain 07/16/2018   Pain in joint, shoulder region 12/11/2015   Preventative health care 11/14/2016   Right flank pain 04/02/2021   Right sided abdominal pain 01/14/2021   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 1 and 1/2 tablet by mouth daily     aspirin EC 81 MG tablet Take 1 tablet (81  mg total) by mouth daily. Swallow whole. 30 tablet 11   colchicine 0.6 MG tablet Take 0.6 mg by mouth as needed (gout flare up).     gabapentin (NEURONTIN) 300 MG capsule Take 1 capsule (300 mg total) by mouth 2 (two) times daily. 60 capsule 11   hydrochlorothiazide (HYDRODIURIL) 25 MG tablet TAKE 1 TABLET BY MOUTH EVERY DAY (Patient taking differently: Take 25 mg by mouth daily.) 90 tablet 2   lidocaine (XYLOCAINE) 5 % ointment Apply 1 application topically as needed. (Patient taking differently: Apply 1 application topically as needed for mild pain or moderate pain.) 70.88 g 3   minoxidil (LONITEN) 10 MG tablet Take 10 mg in the morning, take 5 mg at night. 160 tablet 3   pantoprazole (PROTONIX) 40 MG tablet TAKE 1 TABLET BY MOUTH TWICE A DAY 180 tablet 1   traMADol (ULTRAM) 50 MG tablet Take 50 mg by mouth 2 (two) times daily as needed for severe pain or moderate pain.     valsartan (DIOVAN) 320 MG tablet TAKE 1 TABLET BY MOUTH EVERY DAY (Patient taking differently: Take 320 mg by mouth daily.) 90 tablet 3   No current facility-administered medications on file prior to visit.    Past Surgical History:  Procedure Laterality Date   COLONOSCOPY WITH PROPOFOL  2017   CYSTOSCOPY/RETROGRADE/URETEROSCOPY/STONE EXTRACTION WITH BASKET Left 04/16/2018   Procedure: CYSTOSCOPY/RETROGRADE/URETEROSCOPY/STONE EXTRACTION WITH BASKET/ HOLMIUM LASER LITHOTRIPSY/  STENT PLACEMENT;  Surgeon: Ardis Hughs, MD;  Location: [facility];  Service: Urology;  Laterality: Left;   HOLMIUM LASER APPLICATION Left 2/70/6237   Procedure: HOLMIUM LASER APPLICATION;  Surgeon: Ardis Hughs, MD;  Location[facility];  Service: Urology;  Laterality: Left;    Allergies  Allergen Reactions   Statins     Jaw tightness and severe muscle pain- Pravastatin     BP (!) 172/70   Ht 5\' 11"  (1.803 m)   Wt 218 lb (98.9 kg)   BMI 30.40 kg/m   [facility]  07/30/2020 09/10/2020  Frequency of aerobic exercise (# of days/week) 5 5  Average time in minutes 60 60  Frequency of strengthening activities (# of days/week) 0 0    No flowsheet data found.      Objective:  Physical Exam:  Gen: NAD, comfortable in exam room  Right hip: No deformity. FROM with 5/5 strength but pain on resisted hip flexion and straight leg raise. No tenderness to palpation. NVI distally. Negative logroll Mild pain with fadir.  Negative faber and piriformis stretch.   Assessment & Plan:  1. Right hip pain - 2/2 hip flexor strain with some mild underlying arthritis.  Start physical therapy on Friday and do home exercises on days he doesn't go to therapy.  Tylenol, tramadol if needed.  F/u in 6 weeks.

## 2021-10-25 DIAGNOSIS — M25551 Pain in right hip: Secondary | ICD-10-CM | POA: Diagnosis not present

## 2021-10-29 DIAGNOSIS — M25551 Pain in right hip: Secondary | ICD-10-CM | POA: Diagnosis not present

## 2021-10-30 DIAGNOSIS — G629 Polyneuropathy, unspecified: Secondary | ICD-10-CM | POA: Diagnosis not present

## 2021-10-30 DIAGNOSIS — G608 Other hereditary and idiopathic neuropathies: Secondary | ICD-10-CM | POA: Diagnosis not present

## 2021-10-30 DIAGNOSIS — Z1159 Encounter for screening for other viral diseases: Secondary | ICD-10-CM | POA: Diagnosis not present

## 2021-10-30 DIAGNOSIS — Z87891 Personal history of nicotine dependence: Secondary | ICD-10-CM | POA: Diagnosis not present

## 2021-10-30 DIAGNOSIS — Z79899 Other long term (current) drug therapy: Secondary | ICD-10-CM | POA: Diagnosis not present

## 2021-10-30 DIAGNOSIS — Z8673 Personal history of transient ischemic attack (TIA), and cerebral infarction without residual deficits: Secondary | ICD-10-CM | POA: Diagnosis not present

## 2021-11-07 ENCOUNTER — Encounter: Payer: Self-pay | Admitting: Neurology

## 2021-11-14 ENCOUNTER — Encounter: Payer: Self-pay | Admitting: Family Medicine

## 2021-11-14 DIAGNOSIS — G4733 Obstructive sleep apnea (adult) (pediatric): Secondary | ICD-10-CM | POA: Diagnosis not present

## 2021-11-19 DIAGNOSIS — E669 Obesity, unspecified: Secondary | ICD-10-CM | POA: Diagnosis not present

## 2021-11-19 DIAGNOSIS — M2550 Pain in unspecified joint: Secondary | ICD-10-CM | POA: Diagnosis not present

## 2021-11-19 DIAGNOSIS — M1A09X0 Idiopathic chronic gout, multiple sites, without tophus (tophi): Secondary | ICD-10-CM | POA: Diagnosis not present

## 2021-11-19 DIAGNOSIS — Z6831 Body mass index (BMI) 31.0-31.9, adult: Secondary | ICD-10-CM | POA: Diagnosis not present

## 2021-11-19 DIAGNOSIS — M150 Primary generalized (osteo)arthritis: Secondary | ICD-10-CM | POA: Diagnosis not present

## 2021-12-11 DIAGNOSIS — G629 Polyneuropathy, unspecified: Secondary | ICD-10-CM | POA: Insufficient documentation

## 2021-12-13 DIAGNOSIS — M5126 Other intervertebral disc displacement, lumbar region: Secondary | ICD-10-CM | POA: Diagnosis not present

## 2021-12-13 DIAGNOSIS — M5146 Schmorl's nodes, lumbar region: Secondary | ICD-10-CM | POA: Diagnosis not present

## 2021-12-13 DIAGNOSIS — M48061 Spinal stenosis, lumbar region without neurogenic claudication: Secondary | ICD-10-CM | POA: Diagnosis not present

## 2021-12-13 DIAGNOSIS — G629 Polyneuropathy, unspecified: Secondary | ICD-10-CM | POA: Diagnosis not present

## 2021-12-15 DIAGNOSIS — G4733 Obstructive sleep apnea (adult) (pediatric): Secondary | ICD-10-CM | POA: Diagnosis not present

## 2021-12-18 DIAGNOSIS — M792 Neuralgia and neuritis, unspecified: Secondary | ICD-10-CM | POA: Diagnosis not present

## 2021-12-18 DIAGNOSIS — G629 Polyneuropathy, unspecified: Secondary | ICD-10-CM | POA: Diagnosis not present

## 2021-12-18 DIAGNOSIS — M25551 Pain in right hip: Secondary | ICD-10-CM | POA: Diagnosis not present

## 2021-12-18 DIAGNOSIS — G8929 Other chronic pain: Secondary | ICD-10-CM | POA: Diagnosis not present

## 2021-12-18 DIAGNOSIS — M25552 Pain in left hip: Secondary | ICD-10-CM | POA: Diagnosis not present

## 2022-01-05 ENCOUNTER — Encounter: Payer: Self-pay | Admitting: Family Medicine

## 2022-01-09 ENCOUNTER — Encounter: Payer: Self-pay | Admitting: Neurology

## 2022-01-09 ENCOUNTER — Ambulatory Visit: Payer: Medicare HMO | Admitting: Neurology

## 2022-01-09 VITALS — BP 159/74 | HR 98 | Ht 70.0 in | Wt 221.0 lb

## 2022-01-09 DIAGNOSIS — G4733 Obstructive sleep apnea (adult) (pediatric): Secondary | ICD-10-CM | POA: Diagnosis not present

## 2022-01-09 DIAGNOSIS — Z9989 Dependence on other enabling machines and devices: Secondary | ICD-10-CM | POA: Diagnosis not present

## 2022-01-09 NOTE — Patient Instructions (Signed)
It was nice to see you again.  I am glad to hear, that you have adjusted well to your autoPAP and you are fully compliant. Your apnea scores look good and we can maintain you on the current pressure settings.  Please continue using your autoPAP regularly. While your insurance requires that you use PAP at least 4 hours each night on 70% of the nights, I recommend, that you not skip any nights and use it throughout the night if you can. Getting used to PAP and staying with the treatment long term does take time and patience and discipline. Untreated obstructive sleep apnea when it is moderate to severe can have an adverse impact on cardiovascular health and raise her risk for heart disease, arrhythmias, hypertension, congestive heart failure, stroke and diabetes. Untreated obstructive sleep apnea causes sleep disruption, nonrestorative sleep, and sleep deprivation. This can have an impact on your day to day functioning and cause daytime sleepiness and impairment of cognitive function, memory loss, mood disturbance, and problems focussing. Using PAP regularly can improve these symptoms.  We can see you in 1 year, you can see one of our nurse practitioners as you are stable.

## 2022-01-09 NOTE — Progress Notes (Signed)
Subjective:    Patient ID: Thomas [name] is a 71 y.o. male.  HPI    Interim history:   Thomas [name] is a 71 year old right-handed gentleman with an underlying medical history of hypertension, hyperlipidemia, left thalamic stroke, gout, kidney stones, squamous cell skin cancer, chronic pain, and borderline obesity, who presents for follow-up consultation of his obstructive sleep apnea after interim testing and starting AutoPap therapy.  The patient is unaccompanied today.  I last saw him on 06/11/2021 for re-evaluation of his obstructive sleep apnea concerns.  We proceeded with a home sleep test on 07/29/2021 which indicated severe obstructive sleep apnea with an AHI of 35.3/h, O2 nadir 76% with mild to moderate snoring detected.  He was advised to start AutoPap therapy. It was 10/15/2021.  He has a Personnel officer.  Today, 2/33/2023: I reviewed his autoPAP compliance data from 10/15/2021 through 11/13/2021, which is a total of 30 days, during which time he used his machine every night with percent use days greater than 4 hours at 100%, indicating superb compliance with an average usage out of 7 hours and 5 minutes, residual AHI at goal at 1.5/h, leak variable, average leak at 15.5 L/min, no significant central events, average pressure for the 95th percentile at 7.8 cm with a range of 6 to 12 cm.  I reviewed his most recent month as well from 12/09/2021 through 01/07/2022, he was at 100% compliant with an average usage of 6 hours and 51 minutes, average pressure for the 95th percentile right at 8 cm, average leak 12.1 L/min, average AHI 1.9/h.  He reports having adjusted well to treatment, he uses a nasal mask, sometimes he does note the mask leaking and sometimes he wakes up with a dry mouth.  He is hydrating a lot more than he used to, he estimates that he has doubled his water intake.  He has nocturia about once per average night.  He reports that he has benefited from treatment and is certainly motivated to  continue with it.  His wife has given feedback that he no longer snores.  He is followed by a neurologist at [facility].   He has started new medications including Cymbalta, Celebrex, and currently also on prednisone 30 mg daily.  The patient's allergies, current medications, family history, past medical history, past social history, past surgical history and problem list were reviewed and updated as appropriate.    Previously:   I first met him at the request of Frann Rider, NP and Dr. Leonie Man on 05/28/2020 at which time he reported snoring and witnessed apneas per wife's report.  He was advised to proceed with a sleep study.  He did not pursue testing at the time.    05/28/20: (He) reports snoring and witnessed apneas per wife's report.  He has had difficulty control hypertension for a few years.  He also suffers from chronic gout and intermittent flareup of his gout.  His father had sleep apnea and used a CPAP machine in his 18s.  I reviewed your office note from 04/24/2020.  His Epworth sleepiness score is 3 out of 24 today, fatigue severity score is 27 out of 63. Patient lives with his wife, he works as Engineer, site for a private school.  He smoked off and on in his 68s, quit 35 years ago.  He has 2 grown children, 50 year old son is in college, he has a 62 year old as well.  He has nocturia about 2-3 times per average night, denies recurrent morning headaches.  He has a bedtime of around 10, rise time around 5.  He drinks caffeine in the form of coffee, 2 cups/day on average in the mornings, no alcohol in the recent past.  He is still in physical therapy.  His strength is fairly good on the right side, still has some sensory symptoms particularly in the right leg. He is a side sleeper.  They do have a TV in the bedroom but he does not have to watch it at night, likes to read before falling asleep.  They have a dog in the household.    His Past Medical History Is Significant For: Past Medical  History:  Diagnosis Date   Abdominal wall pain 04/02/2021   Acute ischemic stroke ([place]) 03/22/2020   Avulsed toenail, initial encounter 05/04/2018   Chronic gout    multiple sites--- followed by pcp--- (04-14-2018  per pt last flare-up , march 2019)   Dyspnea on exertion 02/10/2020   ED (erectile dysfunction)    Epidermoid cyst of neck 10/16/2017   Essential hypertension 07/21/2017   Gout 01/19/2014   Gout of multiple sites 07/21/2017   History of kidney stones    History of squamous cell carcinoma excision    Hyperlipidemia    Hypertension    Hypertension not at goal 01/19/2014   It band syndrome, left 03/19/2020   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/24/2020   Left ureteral calculus    Lower abdominal pain 08/27/2015   Metatarsalgia of both feet 03/19/2020   Multiple joint pain 07/16/2018   Pain in joint, shoulder region 12/11/2015   Preventative health care 11/14/2016   Right flank pain 04/02/2021   Right sided abdominal pain 01/14/2021   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   Urgency of urination    URI (upper respiratory infection) 11/08/2014   Wears glasses     His Past Surgical History Is Significant For: Past Surgical History:  Procedure Laterality Date   COLONOSCOPY WITH PROPOFOL  2017   CYSTOSCOPY/RETROGRADE/URETEROSCOPY/STONE EXTRACTION WITH BASKET Left 04/16/2018   Procedure: CYSTOSCOPY/RETROGRADE/URETEROSCOPY/STONE EXTRACTION WITH BASKET/ HOLMIUM LASER LITHOTRIPSY/ STENT PLACEMENT;  Surgeon: Ardis Hughs, MD;  Location: [facility];  Service: Urology;  Laterality: Left;   HOLMIUM LASER APPLICATION Left 7/91/5056   Procedure: HOLMIUM LASER APPLICATION;  Surgeon: Ardis Hughs, MD;  Location: [facility];  Service: Urology;  Laterality: Left;    His Family History Is Significant For: Family History  Problem Relation Age of Onset   Heart disease Mother 37   Alcohol abuse Mother    Depression Mother     Hypertension Father    Sleep apnea Father    Cancer Sister        hodgkins, breast   Colon cancer Neg Hx    Pancreatic cancer Neg Hx    Rectal cancer Neg Hx    Stomach cancer Neg Hx    Esophageal cancer Neg Hx    Inflammatory bowel disease Neg Hx    Liver disease Neg Hx     His Social History Is Significant For: Social History   Socioeconomic History   Marital status: Married    Spouse name: [name]   Number of children: 2   Years of education: Not on file   Highest education level: Not on file  Occupational History   Occupation: Pharmacist, hospital  Tobacco Use   Smoking status: Former    Packs/day: 0.30    Years: 20.00    Pack years: 6.00  Types: Cigarettes    Quit date: 10/17/1994    Years since quitting: 27.2   Smokeless tobacco: Never  Vaping Use   Vaping Use: Not on file  Substance and Sexual Activity   Alcohol use: Not Currently   Drug use: No   Sexual activity: Yes  Other Topics Concern   Not on file  Social History Narrative   Exercise 1 hour a day ---3-4 days a week   Social Determinants of Health   Financial Resource Strain: Not on file  Food Insecurity: Not on file  Transportation Needs: Not on file  Physical Activity: Not on file  Stress: Not on file  Social Connections: Not on file    His Allergies Are:  Allergies  Allergen Reactions   Statins     Jaw tightness and severe muscle pain- Pravastatin   :   His Current Medications Are:  Outpatient Encounter Medications as of 01/09/2022  Medication Sig   allopurinol (ZYLOPRIM) 300 MG tablet Take 300 mg by mouth daily. 1 and 1/2 tablet by mouth daily   aspirin EC 81 MG tablet Take 1 tablet (81 mg total) by mouth daily. Swallow whole.   celecoxib (CELEBREX) 100 MG capsule Take 100 mg by mouth. Takes 2 capsules in am and 2 capsules PM   DULoxetine (CYMBALTA) 30 MG capsule Take by mouth.   gabapentin (NEURONTIN) 300 MG capsule Take 1 capsule (300 mg total) by mouth 2 (two) times daily.   hydrochlorothiazide  (HYDRODIURIL) 25 MG tablet TAKE 1 TABLET BY MOUTH EVERY DAY (Patient taking differently: Take 25 mg by mouth daily.)   minoxidil (LONITEN) 10 MG tablet Take 10 mg in the morning, take 5 mg at night.   pantoprazole (PROTONIX) 40 MG tablet TAKE 1 TABLET BY MOUTH TWICE A DAY   predniSONE (DELTASONE) 10 MG tablet Take 30 mg by mouth daily.   traMADol (ULTRAM) 50 MG tablet Take 50 mg by mouth 2 (two) times daily as needed for severe pain or moderate pain.   valsartan (DIOVAN) 320 MG tablet TAKE 1 TABLET BY MOUTH EVERY DAY (Patient taking differently: Take 320 mg by mouth daily.)   colchicine 0.6 MG tablet Take 0.6 mg by mouth as needed (gout flare up).   lidocaine (XYLOCAINE) 5 % ointment Apply 1 application topically as needed. (Patient taking differently: Apply 1 application topically as needed for mild pain or moderate pain.)   No facility-administered encounter medications on file as of 01/09/2022.  :  Review of Systems:  Out of a complete 14 point review of systems, all are reviewed and negative with the exception of these symptoms as listed below:   Review of Systems  Neurological:        Pt is here for CPAP follow up . Pt states everything is going well . Pt states no questions or concerns for today's visit    Objective:  Neurological Exam  Physical Exam Physical Examination:   Vitals:   01/09/22 1308  BP: (!) 159/74  Pulse: 98    General Examination: The patient is a very pleasant 71 y.o. male in no acute distress. He appears well-developed and well-nourished and well groomed.   HEENT: Normocephalic, atraumatic, pupils are equal, round and reactive to light, corrective eyeglasses in place.  Tracking well-preserved.  Hearing grossly intact.  Speech is clear, no dysarthria or hypophonia or voice tremor.  Airway examination with stable findings.  No significant mouth dryness noted today, tongue protrudes centrally and palate elevates symmetrically.  Chest: Clear to auscultation  without wheezing, rhonchi or crackles noted.   Heart: S1+S2+0, regular and normal without murmurs, rubs or gallops noted.   Abdomen: Soft, non-tender and non-distended.   Extremities: There is no pitting edema in the distal lower extremities bilaterally.   Skin: Warm and dry without trophic changes noted.   Musculoskeletal: exam reveals no obvious joint deformities.   Neurologically: Mental status: The patient is awake, alert and oriented in all 4 spheres. His immediate and remote memory, attention, language skills and fund of knowledge are appropriate. There is no evidence of aphasia, agnosia, apraxia or anomia. Speech is clear with normal prosody and enunciation. Thought process is linear. Mood is normal and affect is normal. Cranial nerves II - XII are as described above under HEENT exam. Motor exam: Normal bulk, strength and tone is noted. Fine motor skills and coordination: grossly intact. Cerebellar testing: No dysmetria or intention tremor. There is no truncal or gait ataxia. Sensory exam: intact to light touch.  Gait, station balance unremarkable.  Assessment and Plan:  In summary, Thomas [name] is a very pleasant 71 year old male with an underlying medical history of hypertension, hyperlipidemia, left thalamic stroke, gout, kidney stones, squamous cell skin cancer, chronic pain, and borderline obesity, who presents for follow-up consultation of his obstructive sleep apnea which was deemed in the severe range by home sleep testing on 07/29/2021.  His AHI was 35.3/h, O2 nadir 76%, with mild to moderate snoring detected.  He has established treatment with AutoPap since 10/15/2021.  He has adjusted well to treatment and has benefited from it.  He is fully compliant with it with good apnea control on the current setting, average pressure of around 8 cm, average usage of about 7 hours.  He is commended for his treatment adherence and advised to continue with AutoPap therapy at the current  settings.  He uses a nasal mask with good tolerance, he has a higher leak from time to time but it is not bothersome to him.  He has been hydrating well with water and uses the humidifier in the machine with distilled water.  At this juncture, he is advised to follow-up routinely in sleep clinic to see one of our nurse practitioners in 1 year.  I answered all his questions today and he was in agreement.   I spent 20 minutes in total face-to-face time and in reviewing records during pre-charting, more than 50% of which was spent in counseling and coordination of care, reviewing test results, reviewing medications and treatment regimen and/or in discussing or reviewing the diagnosis of OSA, the prognosis and treatment options. Pertinent laboratory and imaging test results that were available during this visit with the patient were reviewed by me and considered in my medical decision making (see chart for details).

## 2022-01-11 ENCOUNTER — Other Ambulatory Visit: Payer: Self-pay | Admitting: Cardiology

## 2022-01-14 DIAGNOSIS — G4733 Obstructive sleep apnea (adult) (pediatric): Secondary | ICD-10-CM | POA: Diagnosis not present

## 2022-01-28 DIAGNOSIS — G608 Other hereditary and idiopathic neuropathies: Secondary | ICD-10-CM | POA: Diagnosis not present

## 2022-01-28 DIAGNOSIS — R202 Paresthesia of skin: Secondary | ICD-10-CM | POA: Diagnosis not present

## 2022-01-30 ENCOUNTER — Other Ambulatory Visit: Payer: Self-pay | Admitting: Gastroenterology

## 2022-02-11 ENCOUNTER — Encounter: Payer: Self-pay | Admitting: Gastroenterology

## 2022-02-11 DIAGNOSIS — R7303 Prediabetes: Secondary | ICD-10-CM | POA: Diagnosis not present

## 2022-02-11 DIAGNOSIS — E042 Nontoxic multinodular goiter: Secondary | ICD-10-CM | POA: Diagnosis not present

## 2022-02-11 DIAGNOSIS — I119 Hypertensive heart disease without heart failure: Secondary | ICD-10-CM | POA: Diagnosis not present

## 2022-02-11 DIAGNOSIS — M109 Gout, unspecified: Secondary | ICD-10-CM | POA: Diagnosis not present

## 2022-02-11 DIAGNOSIS — R7989 Other specified abnormal findings of blood chemistry: Secondary | ICD-10-CM | POA: Diagnosis not present

## 2022-02-11 DIAGNOSIS — G4733 Obstructive sleep apnea (adult) (pediatric): Secondary | ICD-10-CM | POA: Diagnosis not present

## 2022-02-11 DIAGNOSIS — Z125 Encounter for screening for malignant neoplasm of prostate: Secondary | ICD-10-CM | POA: Diagnosis not present

## 2022-02-12 DIAGNOSIS — G4733 Obstructive sleep apnea (adult) (pediatric): Secondary | ICD-10-CM | POA: Diagnosis not present

## 2022-02-13 DIAGNOSIS — G8929 Other chronic pain: Secondary | ICD-10-CM | POA: Diagnosis not present

## 2022-02-13 DIAGNOSIS — E042 Nontoxic multinodular goiter: Secondary | ICD-10-CM | POA: Diagnosis not present

## 2022-02-13 DIAGNOSIS — R7303 Prediabetes: Secondary | ICD-10-CM | POA: Diagnosis not present

## 2022-02-13 DIAGNOSIS — Z23 Encounter for immunization: Secondary | ICD-10-CM | POA: Diagnosis not present

## 2022-02-13 DIAGNOSIS — Z8679 Personal history of other diseases of the circulatory system: Secondary | ICD-10-CM | POA: Diagnosis not present

## 2022-02-13 DIAGNOSIS — I119 Hypertensive heart disease without heart failure: Secondary | ICD-10-CM | POA: Diagnosis not present

## 2022-02-13 DIAGNOSIS — I700 Atherosclerosis of aorta: Secondary | ICD-10-CM | POA: Diagnosis not present

## 2022-02-13 DIAGNOSIS — E669 Obesity, unspecified: Secondary | ICD-10-CM | POA: Diagnosis not present

## 2022-02-13 DIAGNOSIS — M109 Gout, unspecified: Secondary | ICD-10-CM | POA: Diagnosis not present

## 2022-02-13 DIAGNOSIS — Z0000 Encounter for general adult medical examination without abnormal findings: Secondary | ICD-10-CM | POA: Diagnosis not present

## 2022-02-22 ENCOUNTER — Other Ambulatory Visit: Payer: Self-pay | Admitting: Cardiology

## 2022-03-14 DIAGNOSIS — G4733 Obstructive sleep apnea (adult) (pediatric): Secondary | ICD-10-CM | POA: Diagnosis not present

## 2022-03-15 DIAGNOSIS — G4733 Obstructive sleep apnea (adult) (pediatric): Secondary | ICD-10-CM | POA: Diagnosis not present

## 2022-03-19 ENCOUNTER — Ambulatory Visit (INDEPENDENT_AMBULATORY_CARE_PROVIDER_SITE_OTHER): Payer: Medicare HMO | Admitting: Orthopaedic Surgery

## 2022-03-19 ENCOUNTER — Encounter: Payer: Self-pay | Admitting: Orthopaedic Surgery

## 2022-03-19 DIAGNOSIS — M1611 Unilateral primary osteoarthritis, right hip: Secondary | ICD-10-CM | POA: Insufficient documentation

## 2022-03-19 DIAGNOSIS — M1612 Unilateral primary osteoarthritis, left hip: Secondary | ICD-10-CM | POA: Diagnosis not present

## 2022-03-19 MED ORDER — CELECOXIB 200 MG PO CAPS: 200.0000 mg | ORAL_CAPSULE | Freq: Two times a day (BID) | ORAL | 3 refills | Status: DC

## 2022-03-19 NOTE — Progress Notes (Signed)
? ?Office Visit Note ?  ?Patient: Thomas [name]           ?Date of Birth: 1951-03-24           ?MRN: [id number] ?Visit Date: 03/19/2022 ?             ?Requested by: Michael Boston, MD ?[address] ?[place],  [place] [address] ?PCP: Michael Boston, MD ? ? ?Assessment & Plan: ?Visit Diagnoses:  ?1. Primary osteoarthritis of right hip   ?2. Primary osteoarthritis of left hip   ? ? ?Plan: Impression is bilateral hip pain worse on the right.  I reviewed multiple x-rays and lumbar spine MRI studies from [facility] system which are relatively unrevealing.  It is conceivable that the arthritis could be symptomatic or he may have a degenerative labrum as well therefore we will order an intra-articular right hip injection with Dr. Barbaraann Barthel.  I have prescribed Celebrex in the meantime.  Based on findings concerned that there is possibility that his pain is not related to an orthopedic or structural problem but rather an underlying rheumatologic or pain syndrome type of condition. ? ?Follow-Up Instructions: No follow-ups on file.  ? ?Orders:  ?Orders Placed This Encounter  ?Procedures  ? Ambulatory referral to Physical Therapy  ? ?Meds ordered this encounter  ?Medications  ? celecoxib (CELEBREX) 200 MG capsule  ?  Sig: Take 1 capsule (200 mg total) by mouth 2 (two) times daily.  ?  Dispense:  30 capsule  ?  Refill:  3  ? ? ? ? Procedures: ?No procedures performed ? ? ?Clinical Data: ?No additional findings. ? ? ?Subjective: ?Chief Complaint  ?Patient presents with  ? Right Hip - Pain  ? ? ?HPI ? ?Thomas [name] is a 71 year old gentleman here for bilateral hip pain worse on the right.  He works at a [employer] in [place].  He had a stroke 2 years ago and has had problems with lack of strength and pain since then.  He has had some groin pain.  Denies any injuries.  Recently gained 20 pounds which has made the pain worse.  He takes Lyrica for neuropathic pain.  Uses lidocaine patch as well.  He has tried prednisone and  Celebrex with some relief. ? ?Review of Systems  ?Constitutional: Negative.   ?All other systems reviewed and are negative. ? ? ?Objective: ?Vital Signs: There were no vitals taken for this visit. ? ?Physical Exam ?Vitals and nursing note reviewed.  ?Constitutional:   ?   Appearance: He is well-developed.  ?HENT:  ?   Head: Normocephalic and atraumatic.  ?Eyes:  ?   Pupils: Pupils are equal, round, and reactive to light.  ?Pulmonary:  ?   Effort: Pulmonary effort is normal.  ?Abdominal:  ?   Palpations: Abdomen is soft.  ?Musculoskeletal:     ?   General: Normal range of motion.  ?   Cervical back: Neck supple.  ?Skin: ?   General: Skin is warm.  ?Neurological:  ?   Mental Status: He is alert and oriented to person, place, and time.  ?Psychiatric:     ?   Behavior: Behavior normal.     ?   Thought Content: Thought content normal.     ?   Judgment: Judgment normal.  ? ? ?Ortho Exam ? ?Examination of the hips is fairly unremarkable and nonfocal.  No significant tenderness to palpation.  Range of motion is preserved and well-tolerated. ? ?Specialty Comments:  ?No  specialty comments available. ? ?Imaging: ?No results found. ? ? ?PMFS History: ?Patient Active Problem List  ? Diagnosis Date Noted  ? 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  ? Primary osteoarthritis of left hip 03/19/2022  ? Chronic gouty arthritis 04/23/2021  ? Primary osteoarthritis 04/23/2021  ? Abdominal wall pain 04/02/2021  ? Dysphagia 04/02/2021  ? Right flank pain 04/02/2021  ? Wears glasses   ? Left ureteral calculus   ? Hypertension   ? Hyperlipidemia   ? History of squamous cell carcinoma excision   ? ED (erectile dysfunction)   ? Chronic gout   ? Right sided abdominal pain 01/14/2021  ? 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/24/2020  ? Acute ischemic stroke ([place]) 03/22/2020  ? It band syndrome, left 03/19/2020  ? Metatarsalgia of both feet 03/19/2020  ? 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 Dyspnea on exertion  02/10/2020  ? Polyarthralgia 07/16/2018  ? Essential hypertension 07/21/2017  ? Gout of multiple sites 07/21/2017  ? ?Past Medical History:  ?Diagnosis Date  ? Abdominal wall pain 04/02/2021  ? Acute ischemic stroke ([place]) 03/22/2020  ? Avulsed toenail, initial encounter 05/04/2018  ? Chronic gout   ? multiple sites--- followed by pcp--- (04-14-2018  per pt last flare-up , march 2019)  ? Dyspnea on exertion 02/10/2020  ? ED (erectile dysfunction)   ? Epidermoid cyst of neck 10/16/2017  ? Essential hypertension 07/21/2017  ? Gout 01/19/2014  ? Gout of multiple sites 07/21/2017  ? History of kidney stones   ? History of squamous cell carcinoma excision   ? Hyperlipidemia   ? Hypertension   ? Hypertension not at goal 01/19/2014  ? It band syndrome, left 03/19/2020  ? 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/24/2020  ? Left ureteral calculus   ? Lower abdominal pain 08/27/2015  ? Metatarsalgia of both feet 03/19/2020  ? Multiple joint pain 07/16/2018  ? Pain in joint, shoulder region 12/11/2015  ? Preventative health care 11/14/2016  ? Right flank pain 04/02/2021  ? Right sided abdominal pain 01/14/2021  ? 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 Urgency of urination   ? URI (upper respiratory infection) 11/08/2014  ? Wears glasses   ?  ?Family History  ?Problem Relation Age of Onset  ? Heart disease Mother 41  ? Alcohol abuse Mother   ? Depression Mother   ? Hypertension Father   ? Sleep apnea Father   ? Cancer Sister   ?     hodgkins, breast  ? Colon cancer Neg Hx   ? Pancreatic cancer Neg Hx   ? Rectal cancer Neg Hx   ? Stomach cancer Neg Hx   ? Esophageal cancer Neg Hx   ? Inflammatory bowel disease Neg Hx   ? Liver disease Neg Hx   ?  ?Past Surgical History:  ?Procedure Laterality Date  ? COLONOSCOPY WITH PROPOFOL  2017  ? CYSTOSCOPY/RETROGRADE/URETEROSCOPY/STONE EXTRACTION WITH BASKET Left 04/16/2018  ? Procedure: CYSTOSCOPY/RETROGRADE/URETEROSCOPY/STONE EXTRACTION WITH BASKET/ HOLMIUM LASER LITHOTRIPSY/ STENT  PLACEMENT;  Surgeon: Ardis Hughs, MD;  Location: [facility];  Service: Urology;  Laterality: Left;  ? HOLMIUM LASER APPLICATION Left 08/10/2682  ? Procedure: HOLMIUM LASER APPLICATION;  Surgeon: Ardis Hughs, MD;  Location: [facility];  Service: Urology;  Laterality: Left;  ? ?Social History  ? ?Occupational History  ? Occupation: Pharmacist, hospital  ?Tobacco Use  ? Smoking status: Former  ?  Packs/day: 0.30  ?  Years: 20.00  ?  Pack years: 6.00  ?  Types: Cigarettes  ?  Quit date: 10/17/1994  ?  Years since quitting: 27.4  ? Smokeless tobacco: Never  ?Vaping Use  ? Vaping Use: Not on file  ?Substance and Sexual Activity  ? Alcohol use: Not Currently  ? Drug use: No  ? Sexual activity: Yes  ? ? ? ? ? ? ?

## 2022-03-20 ENCOUNTER — Other Ambulatory Visit: Payer: Self-pay

## 2022-03-20 DIAGNOSIS — M1612 Unilateral primary osteoarthritis, left hip: Secondary | ICD-10-CM

## 2022-03-20 DIAGNOSIS — M1611 Unilateral primary osteoarthritis, right hip: Secondary | ICD-10-CM

## 2022-03-21 DIAGNOSIS — M25551 Pain in right hip: Secondary | ICD-10-CM | POA: Diagnosis not present

## 2022-03-26 DIAGNOSIS — R69 Illness, unspecified: Secondary | ICD-10-CM | POA: Diagnosis not present

## 2022-03-26 DIAGNOSIS — G5790 Unspecified mononeuropathy of unspecified lower limb: Secondary | ICD-10-CM | POA: Diagnosis not present

## 2022-03-26 DIAGNOSIS — F419 Anxiety disorder, unspecified: Secondary | ICD-10-CM | POA: Diagnosis not present

## 2022-03-27 NOTE — Therapy (Signed)
?OUTPATIENT PHYSICAL THERAPY LOWER EXTREMITY EVALUATION ? ? ?[name] ?MRN: [id number] ?DOB:10-Nov-1951, 71 y.o., male ?Today's Date: 03/28/2022 ? ? PT End of Session - 03/28/22 1150   ? ? Visit Number 1   ? Number of Visits 16   ? Progress Note Due on Visit 10   ? PT Start Time 0845   ? PT Stop Time 0930   ? PT Time Calculation (min) 45 min   ? Activity Tolerance Patient tolerated treatment well;No increased pain   ? Behavior During Therapy [facility] for tasks assessed/performed   ? ?  ?  ? ?  ? ? ?Past Medical History:  ?Diagnosis Date  ? Abdominal wall pain 04/02/2021  ? Acute ischemic stroke ([place]) 03/22/2020  ? Avulsed toenail, initial encounter 05/04/2018  ? Chronic gout   ? multiple sites--- followed by pcp--- (04-14-2018  per pt last flare-up , march 2019)  ? Dyspnea on exertion 02/10/2020  ? ED (erectile dysfunction)   ? Epidermoid cyst of neck 10/16/2017  ? Essential hypertension 07/21/2017  ? Gout 01/19/2014  ? Gout of multiple sites 07/21/2017  ? History of kidney stones   ? History of squamous cell carcinoma excision   ? Hyperlipidemia   ? Hypertension   ? Hypertension not at goal 01/19/2014  ? It band syndrome, left 03/19/2020  ? 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/24/2020  ? Left ureteral calculus   ? Lower abdominal pain 08/27/2015  ? Metatarsalgia of both feet 03/19/2020  ? Multiple joint pain 07/16/2018  ? Pain in joint, shoulder region 12/11/2015  ? Preventative health care 11/14/2016  ? Right flank pain 04/02/2021  ? Right sided abdominal pain 01/14/2021  ? 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 Urgency of urination   ? URI (upper respiratory infection) 11/08/2014  ? Wears glasses   ? ?Past Surgical History:  ?Procedure Laterality Date  ? COLONOSCOPY WITH PROPOFOL  2017  ? CYSTOSCOPY/RETROGRADE/URETEROSCOPY/STONE EXTRACTION WITH BASKET Left 04/16/2018  ? Procedure: CYSTOSCOPY/RETROGRADE/URETEROSCOPY/STONE EXTRACTION WITH BASKET/ HOLMIUM LASER LITHOTRIPSY/ STENT PLACEMENT;  Surgeon:  Ardis Hughs, MD;  Location: [facility];  Service: Urology;  Laterality: Left;  ? HOLMIUM LASER APPLICATION Left 7/82/9562  ? Procedure: HOLMIUM LASER APPLICATION;  Surgeon: Ardis Hughs, MD;  Location: [facility];  Service: Urology;  Laterality: Left;  ? ?Patient Active Problem List  ? Diagnosis Date Noted  ? 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  ? Primary osteoarthritis of left hip 03/19/2022  ? Chronic gouty arthritis 04/23/2021  ? Primary osteoarthritis 04/23/2021  ? Abdominal wall pain 04/02/2021  ? Dysphagia 04/02/2021  ? Right flank pain 04/02/2021  ? Wears glasses   ? Left ureteral calculus   ? Hypertension   ? Hyperlipidemia   ? History of squamous cell carcinoma excision   ? ED (erectile dysfunction)   ? Chronic gout   ? Right sided abdominal pain 01/14/2021  ? 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/24/2020  ? Acute ischemic stroke ([place]) 03/22/2020  ? It band syndrome, left 03/19/2020  ? Metatarsalgia of both feet 03/19/2020  ? 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 Dyspnea on exertion 02/10/2020  ? Polyarthralgia 07/16/2018  ? Essential hypertension 07/21/2017  ? Gout of multiple sites 07/21/2017  ? ? ?PCP: Michael Boston, MD ? ?REFERRING PROVIDER: Leandrew Koyanagi, MD ? ?REFERRING DIAG: M16.11 (ICD-10-CM) - Primary osteoarthritis of right hip M16.12 (ICD-10-CM) - Primary osteoarthritis of left hip  ? ?THERAPY  DIAG:  ?Difficulty in walking, not elsewhere classified ? ?Stiffness of right hip, not elsewhere classified ? ?Stiffness of left hip, not elsewhere classified ? ?Muscle weakness (generalized) ? ?Pain in left hip ? ?Pain in right hip ? ?ONSET DATE: About a year ? ?SUBJECTIVE:  ? ?SUBJECTIVE STATEMENT: ?Thomas [name] has had B hip pain for over a year.  May 2021 stroke.  6-9 months later groin to back/scapular pain.  With steroids, no hip pain.  He is now off steroids and B hip pain is back. ? ?PERTINENT HISTORY: ?Gout, HTN,  Hyperlipidemia, previous stroke, B metatarsalgia, B hip OA ? ?PAIN:  ?Are you having pain? Yes: NPRS scale: 2-5/10 ?Pain location: B groin/hips ?Pain description: Stiff and sore ?Aggravating factors: Prolonged postures and with weight-bearing ?Relieving factors: Steroids ? ?PRECAUTIONS: None ? ?WEIGHT BEARING RESTRICTIONS No ? ?FALLS:  ?Has patient fallen in last 6 months? No ? ?LIVING ENVIRONMENT: ?Lives with: lives with their family ?Lives in: House/apartment ?Stairs:  Difficult ?Has following equipment at home: None ? ?OCCUPATION: Working from home ? ?PLOF: Independent ? ?PATIENT GOALS Return to walking for exercise, decrease pain and improve function. ? ? ?OBJECTIVE:  ? ?DIAGNOSTIC FINDINGS: Normal thoracic spine MRI.  Lumbar & B hips MRI ordered. ? ?PATIENT SURVEYS:  ?FOTO 24 (Goal 40) ? ?COGNITION: ? Overall cognitive status: Within functional limits for tasks assessed   ?  ?SENSATION: ?Not tested ? ?MUSCLE LENGTH: ?Hamstrings: Right 40 deg; Left 40 deg ? ?POSTURE:  ?Mildly flexed posture (rounded shoulders, forward head, decreased lumbar lordosis) ? ?LE ROM: ? ?Passive ROM Right ?03/28/2022 Left ?03/28/2022  ?Hip flexion 90 90  ?Hip extension    ?Hip abduction    ?Hip adduction    ?Hip internal rotation 6 15  ?Hip external rotation 30 25  ?Knee flexion    ?Knee extension    ?Ankle dorsiflexion    ?Ankle plantarflexion    ?Ankle inversion    ?Ankle eversion    ? (Blank rows = not tested) ? ?LE MMT: ? ?MMT Right ?03/28/2022 Left ?03/28/2022  ?Hip flexion    ?Hip extension    ?Hip abduction 2+/5 4/5  ?Hip adduction    ?Hip internal rotation    ?Hip external rotation    ?Knee flexion    ?Knee extension    ?Ankle dorsiflexion    ?Ankle plantarflexion    ?Ankle inversion    ?Ankle eversion    ? (Blank rows = not tested) ? ?GAIT: ?Distance walked: Within clinic ?Assistive device utilized: None ?Level of assistance: Complete Independence ?Comments: Slow, stiff appearance.   ? ? ? ?TODAY'S TREATMENT: ?Modified Thomas  stretch (1 knee bent and other leg hanging off treatment table) 4X 20 seconds ?Figure 4 stretch 4X 20 seconds ?Clams- B 10X 3 seconds with slow eccentrics ? ?Reviewed exam findings and HEP. ? ? ?PATIENT EDUCATION:  ?Education details: See above ?Person educated: Patient ?Education method: Explanation, Demonstration, Tactile cues, Verbal cues, and Handouts ?Education comprehension: verbalized understanding, returned demonstration, verbal cues required, tactile cues required, and needs further education ? ? ?HOME EXERCISE PROGRAM: ?Access Code: [email or web address] ?URL: [email or web address] ?Date: 03/28/2022 ?Prepared by: Vista Mink ? ?Exercises ?- Modified Thomas Stretch  - 2-3 x daily - 7 x weekly - 1 sets - 4-5 reps - 20 seconds hold ?- Supine Figure 4 Piriformis Stretch  - 2-3 x daily - 7 x weekly - 1 sets - 4-5 reps - 20 seconds hold ?- Clam  - 2-3 x daily - 7 x weekly - 2  sets - 10 reps - 3 seconds hold ? ?ASSESSMENT: ? ?CLINICAL IMPRESSION: ?Patient is a 71 y.o. male who was seen today for physical therapy evaluation and treatment for B hip OA.  ? ? ?OBJECTIVE IMPAIRMENTS Abnormal gait, decreased activity tolerance, decreased endurance, decreased knowledge of condition, difficulty walking, decreased ROM, decreased strength, impaired perceived functional ability, impaired flexibility, postural dysfunction, and pain.  ? ?ACTIVITY LIMITATIONS community activity and walking .  ? ?PERSONAL FACTORS Gout, HTN, Hyperlipidemia, previous stroke, B metatarsalgia, B hip OA are also affecting patient's functional outcome.  ? ? ?REHAB POTENTIAL: Good ? ?CLINICAL DECISION MAKING: Stable/uncomplicated ? ?EVALUATION COMPLEXITY: Low ? ? ?GOALS: ?Goals reviewed with patient? Yes ? ?SHORT TERM GOALS: Target date: 04/25/2022  (Remove Blue Hyperlink) ? ?Johanna will be independent with his day 1 HEP. ?Baseline: Started 03/28/2022 ?Goal status: INITIAL ? ?2.  Improve B LE flexibility for hamstrings to 45 degrees; hip flexors to 100  degrees; hip ER to 40 degrees and hip IR to at least 10 degrees. ?Baseline: 40; 90; 25/30; 15/6 respectively ?Goal status: INITIAL ? ? ?LONG TERM GOALS: Target date: 05/23/2022  (Remove Blue Hyperlink) ? ?Improve B

## 2022-03-28 ENCOUNTER — Ambulatory Visit: Payer: Medicare HMO | Admitting: Rehabilitative and Restorative Service Providers"

## 2022-03-28 ENCOUNTER — Encounter: Payer: Self-pay | Admitting: Rehabilitative and Restorative Service Providers"

## 2022-03-28 DIAGNOSIS — M25552 Pain in left hip: Secondary | ICD-10-CM

## 2022-03-28 DIAGNOSIS — M25652 Stiffness of left hip, not elsewhere classified: Secondary | ICD-10-CM | POA: Diagnosis not present

## 2022-03-28 DIAGNOSIS — R262 Difficulty in walking, not elsewhere classified: Secondary | ICD-10-CM

## 2022-03-28 DIAGNOSIS — M25551 Pain in right hip: Secondary | ICD-10-CM

## 2022-03-28 DIAGNOSIS — M6281 Muscle weakness (generalized): Secondary | ICD-10-CM | POA: Diagnosis not present

## 2022-03-28 DIAGNOSIS — M25651 Stiffness of right hip, not elsewhere classified: Secondary | ICD-10-CM

## 2022-03-31 ENCOUNTER — Ambulatory Visit: Payer: Medicare HMO | Admitting: Family Medicine

## 2022-03-31 ENCOUNTER — Ambulatory Visit: Payer: Self-pay

## 2022-03-31 VITALS — BP 154/82 | Ht 70.0 in | Wt 225.0 lb

## 2022-03-31 DIAGNOSIS — M25551 Pain in right hip: Secondary | ICD-10-CM

## 2022-03-31 MED ORDER — METHYLPREDNISOLONE ACETATE 40 MG/ML IJ SUSP
40.0000 mg | Freq: Once | INTRAMUSCULAR | Status: AC
  Administered 2022-03-31: 40 mg via INTRA_ARTICULAR

## 2022-03-31 NOTE — Progress Notes (Signed)
PCP: Michael Boston, MD ? ?Subjective:  ? ?HPI: ?Patient is a 71 y.o. male here for right hip pain. ? ?Patient has been taking prednisone $RemoveBeforeDEI'30mg'MvznqujCKjhOTsqM$  daily while he continues workup for neurologic or rheumatologic cause of his right side, leg pain. ?He had recent bloodwork including CRP, ESR, uric acid, hla-b27, ANA, anti-CCP that were all normal. ?Saw Dr. Erlinda Hong for his right hip pain which has persisted, felt in groin. ?Worse with walking - cannot walk a block due to the pain. ?They discussed [place] going ahead with intraarticular steroid injection for diagnostic and therapeutic purposes. ? ?Past Medical History:  ?Diagnosis Date  ? Abdominal wall pain 04/02/2021  ? Acute ischemic stroke ([place]) 03/22/2020  ? Avulsed toenail, initial encounter 05/04/2018  ? Chronic gout   ? multiple sites--- followed by pcp--- (04-14-2018  per pt last flare-up , march 2019)  ? Dyspnea on exertion 02/10/2020  ? ED (erectile dysfunction)   ? Epidermoid cyst of neck 10/16/2017  ? Essential hypertension 07/21/2017  ? Gout 01/19/2014  ? Gout of multiple sites 07/21/2017  ? History of kidney stones   ? History of squamous cell carcinoma excision   ? Hyperlipidemia   ? Hypertension   ? Hypertension not at goal 01/19/2014  ? It band syndrome, left 03/19/2020  ? 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/24/2020  ? Left ureteral calculus   ? Lower abdominal pain 08/27/2015  ? Metatarsalgia of both feet 03/19/2020  ? Multiple joint pain 07/16/2018  ? Pain in joint, shoulder region 12/11/2015  ? Preventative health care 11/14/2016  ? Right flank pain 04/02/2021  ? Right sided abdominal pain 01/14/2021  ? 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 Urgency of urination   ? URI (upper respiratory infection) 11/08/2014  ? Wears glasses   ? ? ?Current Outpatient Medications on File Prior to Visit  ?Medication Sig Dispense Refill  ? allopurinol (ZYLOPRIM) 300 MG tablet Take 300 mg by mouth daily. 1 and 1/2 tablet by mouth daily    ? aspirin EC 81 MG tablet Take 1 tablet  (81 mg total) by mouth daily. Swallow whole. 30 tablet 11  ? celecoxib (CELEBREX) 100 MG capsule Take 100 mg by mouth. Takes 2 capsules in am and 2 capsules PM    ? celecoxib (CELEBREX) 200 MG capsule Take 1 capsule (200 mg total) by mouth 2 (two) times daily. 30 capsule 3  ? colchicine 0.6 MG tablet Take 0.6 mg by mouth as needed (gout flare up).    ? DULoxetine (CYMBALTA) 30 MG capsule Take by mouth.    ? gabapentin (NEURONTIN) 300 MG capsule Take 1 capsule (300 mg total) by mouth 2 (two) times daily. 60 capsule 11  ? hydrochlorothiazide (HYDRODIURIL) 25 MG tablet Take 1 tablet (25 mg total) by mouth daily. 90 tablet 0  ? lidocaine (XYLOCAINE) 5 % ointment Apply 1 application topically as needed. (Patient taking differently: Apply 1 application topically as needed for mild pain or moderate pain.) 70.88 g 3  ? minoxidil (LONITEN) 10 MG tablet Take 10 mg in the morning, take 5 mg at night. 160 tablet 3  ? pantoprazole (PROTONIX) 40 MG tablet TAKE 1 TABLET BY MOUTH TWICE A DAY 180 tablet 1  ? predniSONE (DELTASONE) 10 MG tablet Take 30 mg by mouth daily.    ? traMADol (ULTRAM) 50 MG tablet Take 50 mg by mouth 2 (two) times daily as needed for severe pain or moderate pain.    ? valsartan (DIOVAN) 320  MG tablet TAKE 1 TABLET BY MOUTH EVERY DAY 90 tablet 0  ? ?No current facility-administered medications on file prior to visit.  ? ? ?Past Surgical History:  ?Procedure Laterality Date  ? COLONOSCOPY WITH PROPOFOL  2017  ? CYSTOSCOPY/RETROGRADE/URETEROSCOPY/STONE EXTRACTION WITH BASKET Left 04/16/2018  ? Procedure: CYSTOSCOPY/RETROGRADE/URETEROSCOPY/STONE EXTRACTION WITH BASKET/ HOLMIUM LASER LITHOTRIPSY/ STENT PLACEMENT;  Surgeon: Ardis Hughs, MD;  [facility];  Service: Urology;  Laterality: Left;  ? HOLMIUM LASER APPLICATION Left 7/94/8016  ? Procedure: HOLMIUM LASER APPLICATION;  Surgeon: Ardis Hughs, MD;  Location[facility];  Service: Urology;  Laterality:  Left;  ? ? ?Allergies  ?Allergen Reactions  ? Statins   ?  Jaw tightness and severe muscle pain- Pravastatin   ? ? ?BP (!) 154/82   Ht 5\' 10"  (1.778 m)   Wt 225 lb (102.1 kg)   BMI 32.28 kg/m?  ? ? ?  07/30/2020  ?  3:25 PM 09/10/2020  ?  8:36 AM  ?[place] Adult Exercise  ?Frequency of aerobic exercise (# of days/week) 5 5  ?Average time in minutes 60 60  ?Frequency of strengthening activities (# of days/week) 0 0  ? ? ?   ? View : No data to display.  ?  ?  ?  ? ? ?    ?Objective:  ?Physical Exam: ? ?Gen: NAD, comfortable in exam room ? ?Right hip: ?No deformity. ?Mild limitation ROM with 5/5 strength except 4/5 hip abduction but no pain. ?No tenderness to palpation currently. ?NVI distally. ?Mild pain with full IR on logroll. ?Negative faber, fadir, and piriformis stretches. ?  ?Assessment & Plan:  ?1. Right hip pain - went ahead with intraarticular injection today for diagnostic and therapeutic purposes.  Advised patient to message [place] in a week to let us know how he's doing.  Continue follow-ups with neurology as well. ? ?After informed written consent timeout was performed, patient was lying supine on exam table.  Area overlying right hip joint prepped with alcohol swab then utilizing ultrasound guidance was injected with 4:1 lidocaine: depomedrol 40mg .  Patient tolerated procedure well without immediate complications. ? ?

## 2022-04-09 ENCOUNTER — Encounter: Payer: Medicare HMO | Admitting: Physical Therapy

## 2022-04-11 ENCOUNTER — Encounter: Payer: Self-pay | Admitting: Physical Therapy

## 2022-04-11 ENCOUNTER — Ambulatory Visit: Payer: Medicare HMO | Admitting: Physical Therapy

## 2022-04-11 DIAGNOSIS — M25551 Pain in right hip: Secondary | ICD-10-CM

## 2022-04-11 DIAGNOSIS — R262 Difficulty in walking, not elsewhere classified: Secondary | ICD-10-CM

## 2022-04-11 DIAGNOSIS — M6281 Muscle weakness (generalized): Secondary | ICD-10-CM

## 2022-04-11 DIAGNOSIS — M25652 Stiffness of left hip, not elsewhere classified: Secondary | ICD-10-CM | POA: Diagnosis not present

## 2022-04-11 DIAGNOSIS — M25651 Stiffness of right hip, not elsewhere classified: Secondary | ICD-10-CM | POA: Diagnosis not present

## 2022-04-11 DIAGNOSIS — M25552 Pain in left hip: Secondary | ICD-10-CM | POA: Diagnosis not present

## 2022-04-11 NOTE — Therapy (Signed)
OUTPATIENT PHYSICAL THERAPY TREATMENT   [name] MRN: [id number] DOB:[birth year], 71 y.o., male Today's Date: 04/11/2022   PT End of Session - 04/11/22 0850     Visit Number 2    Number of Visits 16    Progress Note Due on Visit 10    PT Start Time 0845    PT Stop Time 0930    PT Time Calculation (min) 45 min    Activity Tolerance Patient tolerated treatment well;No increased pain    Behavior During Therapy [facility] for tasks assessed/performed             Past Medical History:  Diagnosis Date   Abdominal wall pain 04/02/2021   Acute ischemic stroke ([place]) 03/22/2020   Avulsed toenail, initial encounter 05/04/2018   Chronic gout    multiple sites--- followed by pcp--- (04-14-2018  per pt last flare-up , march 2019)   Dyspnea on exertion 02/10/2020   ED (erectile dysfunction)    Epidermoid cyst of neck 10/16/2017   Essential hypertension 07/21/2017   Gout 01/19/2014   Gout of multiple sites 07/21/2017   History of kidney stones    History of squamous cell carcinoma excision    Hyperlipidemia    Hypertension    Hypertension not at goal 01/19/2014   It band syndrome, left 03/19/2020   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/24/2020   Left ureteral calculus    Lower abdominal pain 08/27/2015   Metatarsalgia of both feet 03/19/2020   Multiple joint pain 07/16/2018   Pain in joint, shoulder region 12/11/2015   Preventative health care 11/14/2016   Right flank pain 04/02/2021   Right sided abdominal pain 01/14/2021   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   Urgency of urination    URI (upper respiratory infection) 11/08/2014   Wears glasses    Past Surgical History:  Procedure Laterality Date   COLONOSCOPY WITH PROPOFOL  2017   CYSTOSCOPY/RETROGRADE/URETEROSCOPY/STONE EXTRACTION WITH BASKET Left 04/16/2018   Procedure: CYSTOSCOPY/RETROGRADE/URETEROSCOPY/STONE EXTRACTION WITH BASKET/ HOLMIUM LASER LITHOTRIPSY/ STENT PLACEMENT;  Surgeon: Ardis Hughs, MD;  Location: [facility];  Service: Urology;  Laterality: Left;   HOLMIUM LASER APPLICATION Left 1/88/4166   Procedure: HOLMIUM LASER APPLICATION;  Surgeon: Ardis Hughs, MD;  Location: [facility];  Service: Urology;  Laterality: Left;   Patient Active Problem List   Diagnosis Date Noted   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   Primary osteoarthritis of left hip 03/19/2022   Chronic gouty arthritis 04/23/2021   Primary osteoarthritis 04/23/2021   Abdominal wall pain 04/02/2021   Dysphagia 04/02/2021   Right flank pain 04/02/2021   Wears glasses    Left ureteral calculus    Hypertension    Hyperlipidemia    History of squamous cell carcinoma excision    ED (erectile dysfunction)    Chronic gout    Right sided abdominal pain 01/14/2021   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/24/2020   Acute ischemic stroke ([place]) 03/22/2020   It band syndrome, left 03/19/2020   Metatarsalgia of both feet 03/19/2020   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   Dyspnea on exertion 02/10/2020   Polyarthralgia 07/16/2018   Essential hypertension 07/21/2017   Gout of multiple sites 07/21/2017    PCP: Michael Boston, MD  REFERRING PROVIDER: Leandrew Koyanagi, MD  REFERRING DIAG: M16.11 (ICD-10-CM) - Primary osteoarthritis of right hip M16.12 (ICD-10-CM) - Primary osteoarthritis of left hip   THERAPY DIAG:  Difficulty in walking, not elsewhere classified  Stiffness of right hip, not elsewhere classified  Stiffness of left hip, not elsewhere classified  Muscle weakness (generalized)  Pain in left hip  Pain in right hip  ONSET DATE: About a year  SUBJECTIVE:   SUBJECTIVE STATEMENT: He relays pain is not too bad this morning, he had injection which helped a lot in the posterior hip but he still has some groin pain.   PERTINENT HISTORY: Gout, HTN, Hyperlipidemia, previous stroke, B metatarsalgia, B hip  OA  PAIN:  Are you having pain? Yes: NPRS scale: 2-5/10 Pain location: B groin/hips Pain description: Stiff and sore Aggravating factors: Prolonged postures and with weight-bearing Relieving factors: Steroids  PRECAUTIONS: None  WEIGHT BEARING RESTRICTIONS No  FALLS:  Has patient fallen in last 6 months? No  LIVING ENVIRONMENT: Lives with: lives with their family Lives in: House/apartment Stairs:  Difficult Has following equipment at home: None  OCCUPATION: Working from home  PLOF: Independent  PATIENT GOALS Return to walking for exercise, decrease pain and improve function.   OBJECTIVE:   DIAGNOSTIC FINDINGS: Normal thoracic spine MRI.  Lumbar & B hips MRI ordered.  PATIENT SURVEYS:  FOTO 24 (Goal 40)  COGNITION:  Overall cognitive status: Within functional limits for tasks assessed     SENSATION: Not tested  MUSCLE LENGTH: Hamstrings: Right 40 deg; Left 40 deg  POSTURE:  Mildly flexed posture (rounded shoulders, forward head, decreased lumbar lordosis)  LE ROM:  Passive ROM Right 03/28/2022 Left 03/28/2022  Hip flexion 90 90  Hip extension    Hip abduction    Hip adduction    Hip internal rotation 6 15  Hip external rotation 30 25  Knee flexion    Knee extension    Ankle dorsiflexion    Ankle plantarflexion    Ankle inversion    Ankle eversion     (Blank rows = not tested)  LE MMT:  MMT Right 03/28/2022 Left 03/28/2022  Hip flexion    Hip extension    Hip abduction 2+/5 4/5  Hip adduction    Hip internal rotation    Hip external rotation    Knee flexion    Knee extension    Ankle dorsiflexion    Ankle plantarflexion    Ankle inversion    Ankle eversion     (Blank rows = not tested)  GAIT: Distance walked: Within clinic Assistive device utilized: None Level of assistance: Complete Independence Comments: Slow, stiff appearance.      TODAY'S TREATMENT: 04/11/22 Recumbent bike L3-2 X 6 min Leg press DL 75# X 20, then SL 50# X  15 bilat Standing hip abduction 2# X 15 bilat Standing hip extension 2# X 15 bilat Seated SLR X 10 on left and 2X5 on Rt Supine hip flexor stretch leg off EOB 1 min X 2 bilat Supine piriformis stretch 3 X 30 sec bilat Supine bridge 3 sec X 10   PATIENT EDUCATION:  Education details: See above Person educated: Patient Education method: Explanation, Demonstration, Tactile cues, Verbal cues, and Handouts Education comprehension: verbalized understanding, returned demonstration, verbal cues required, tactile cues required, and needs further education   HOME EXERCISE PROGRAM: Access Code: [facility] URL: [email or web address] Date: 03/28/2022 Prepared by: Vista Mink  Exercises - Modified Marcello Moores Stretch  - 2-3 x daily - 7 x weekly - 1 sets - 4-5 reps - 20 seconds hold - Supine Figure 4 Piriformis Stretch  - 2-3 x daily - 7 x weekly - 1 sets -  4-5 reps - 20 seconds hold - Clam  - 2-3 x daily - 7 x weekly - 2 sets - 10 reps - 3 seconds hold  ASSESSMENT:  CLINICAL IMPRESSION: We began strength and ROM exercises for his bilat hips today within his pain tolerance. He needs extra rest breaks for Rt LE. We will assess his response and soreness to this activity next session.   OBJECTIVE IMPAIRMENTS Abnormal gait, decreased activity tolerance, decreased endurance, decreased knowledge of condition, difficulty walking, decreased ROM, decreased strength, impaired perceived functional ability, impaired flexibility, postural dysfunction, and pain.   ACTIVITY LIMITATIONS community activity and walking .   PERSONAL FACTORS Gout, HTN, Hyperlipidemia, previous stroke, B metatarsalgia, B hip OA are also affecting patient's functional outcome.    REHAB POTENTIAL: Good  CLINICAL DECISION MAKING: Stable/uncomplicated  EVALUATION COMPLEXITY: Low   GOALS: Goals reviewed with patient? Yes  SHORT TERM GOALS: Target date: 04/25/2022   Tome will be independent with his day 1  HEP. Baseline: Started 03/28/2022 Goal status: INITIAL  2.  Improve B LE flexibility for hamstrings to 45 degrees; hip flexors to 100 degrees; hip ER to 40 degrees and hip IR to at least 10 degrees. Baseline: 40; 90; 25/30; 15/6 respectively Goal status: INITIAL   LONG TERM GOALS: Target date: 05/23/2022   Improve B hip/groin pain to consistently 0-3/10 on the Numeric Pain Rating Scale. Baseline: 2-5/10 on the Numeric Pain Rating Scale Goal status: INITIAL  2.  Improve FOTO to 40 in 15 visits. Baseline: 24 Goal status: INITIAL  3.  Improve B hip abductors strength to 5/5. Baseline: 4/5 and 2+/5 Goal status: INITIAL  4.  Jaxxson will be able to walk a half mile or more without being stopped by B groin/hip pain. Baseline: Short distances are pain limited Goal status: INITIAL  5.  Jacquez will be independent with his long-term HEP at [place]. Baseline: Started 03/28/2022 Goal status: INITIAL  PLAN: PT FREQUENCY: 1-2x/week  PT DURATION: 8 weeks  PLANNED INTERVENTIONS: Therapeutic exercises, Therapeutic activity, Neuromuscular re-education, Balance training, Gait training, Patient/Family education, Joint mobilization, Stair training, Cryotherapy, and Manual therapy  PLAN FOR NEXT SESSION: how was soreness from last session and back down if necessary, Progress to side lie hip abduction and hip hike when ready.   Debbe Odea, PT, DPT 04/11/2022, 8:51 AM

## 2022-04-14 ENCOUNTER — Other Ambulatory Visit: Payer: Self-pay | Admitting: Cardiology

## 2022-04-14 DIAGNOSIS — G4733 Obstructive sleep apnea (adult) (pediatric): Secondary | ICD-10-CM | POA: Diagnosis not present

## 2022-04-15 ENCOUNTER — Encounter: Payer: Medicare HMO | Admitting: Rehabilitative and Restorative Service Providers"

## 2022-04-15 ENCOUNTER — Telehealth: Payer: Self-pay | Admitting: Rehabilitative and Restorative Service Providers"

## 2022-04-15 DIAGNOSIS — G4733 Obstructive sleep apnea (adult) (pediatric): Secondary | ICD-10-CM | POA: Diagnosis not present

## 2022-04-15 NOTE — Telephone Encounter (Signed)
Called and left message after no show for today's appointment.  Reminded of next appointment time.   Scot Jun, PT, DPT, OCS, ATC 04/15/22  9:17 AM

## 2022-04-16 DIAGNOSIS — I119 Hypertensive heart disease without heart failure: Secondary | ICD-10-CM | POA: Diagnosis not present

## 2022-04-16 DIAGNOSIS — M1991 Primary osteoarthritis, unspecified site: Secondary | ICD-10-CM | POA: Diagnosis not present

## 2022-04-16 DIAGNOSIS — M109 Gout, unspecified: Secondary | ICD-10-CM | POA: Diagnosis not present

## 2022-04-16 DIAGNOSIS — M1A09X0 Idiopathic chronic gout, multiple sites, without tophus (tophi): Secondary | ICD-10-CM | POA: Diagnosis not present

## 2022-04-16 DIAGNOSIS — E669 Obesity, unspecified: Secondary | ICD-10-CM | POA: Diagnosis not present

## 2022-04-16 DIAGNOSIS — Z6831 Body mass index (BMI) 31.0-31.9, adult: Secondary | ICD-10-CM | POA: Diagnosis not present

## 2022-04-18 ENCOUNTER — Encounter: Payer: Self-pay | Admitting: Rehabilitative and Restorative Service Providers"

## 2022-04-18 ENCOUNTER — Ambulatory Visit: Payer: Medicare HMO | Admitting: Rehabilitative and Restorative Service Providers"

## 2022-04-18 DIAGNOSIS — M25652 Stiffness of left hip, not elsewhere classified: Secondary | ICD-10-CM

## 2022-04-18 DIAGNOSIS — R262 Difficulty in walking, not elsewhere classified: Secondary | ICD-10-CM | POA: Diagnosis not present

## 2022-04-18 DIAGNOSIS — M25552 Pain in left hip: Secondary | ICD-10-CM | POA: Diagnosis not present

## 2022-04-18 DIAGNOSIS — M6281 Muscle weakness (generalized): Secondary | ICD-10-CM | POA: Diagnosis not present

## 2022-04-18 DIAGNOSIS — M25551 Pain in right hip: Secondary | ICD-10-CM | POA: Diagnosis not present

## 2022-04-18 DIAGNOSIS — M25651 Stiffness of right hip, not elsewhere classified: Secondary | ICD-10-CM

## 2022-04-18 NOTE — Therapy (Signed)
OUTPATIENT PHYSICAL THERAPY TREATMENT   [name] MRN: [id number] DOB:16-May-1951, 71 y.o., male Today's Date: 04/18/2022   PT End of Session - 04/18/22 0935     Visit Number 3    Number of Visits 16    Progress Note Due on Visit 10    PT Start Time 0846    PT Stop Time 0928    PT Time Calculation (min) 42 min    Activity Tolerance Patient tolerated treatment well;No increased pain    Behavior During Therapy [facility] for tasks assessed/performed              Past Medical History:  Diagnosis Date   Abdominal wall pain 04/02/2021   Acute ischemic stroke ([place]) 03/22/2020   Avulsed toenail, initial encounter 05/04/2018   Chronic gout    multiple sites--- followed by pcp--- (04-14-2018  per pt last flare-up , march 2019)   Dyspnea on exertion 02/10/2020   ED (erectile dysfunction)    Epidermoid cyst of neck 10/16/2017   Essential hypertension 07/21/2017   Gout 01/19/2014   Gout of multiple sites 07/21/2017   History of kidney stones    History of squamous cell carcinoma excision    Hyperlipidemia    Hypertension    Hypertension not at goal 01/19/2014   It band syndrome, left 03/19/2020   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/24/2020   Left ureteral calculus    Lower abdominal pain 08/27/2015   Metatarsalgia of both feet 03/19/2020   Multiple joint pain 07/16/2018   Pain in joint, shoulder region 12/11/2015   Preventative health care 11/14/2016   Right flank pain 04/02/2021   Right sided abdominal pain 01/14/2021   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   Urgency of urination    URI (upper respiratory infection) 11/08/2014   Wears glasses    Past Surgical History:  Procedure Laterality Date   COLONOSCOPY WITH PROPOFOL  2017   CYSTOSCOPY/RETROGRADE/URETEROSCOPY/STONE EXTRACTION WITH BASKET Left 04/16/2018   Procedure: CYSTOSCOPY/RETROGRADE/URETEROSCOPY/STONE EXTRACTION WITH BASKET/ HOLMIUM LASER LITHOTRIPSY/ STENT PLACEMENT;  Surgeon: Ardis Hughs, MD;  Location: [facility];  Service: Urology;  Laterality: Left;   HOLMIUM LASER APPLICATION Left 0/86/5784   Procedure: HOLMIUM LASER APPLICATION;  Surgeon: Ardis Hughs, MD;  Location: [facility];  Service: Urology;  Laterality: Left;   Patient Active Problem List   Diagnosis Date Noted   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   Primary osteoarthritis of left hip 03/19/2022   Chronic gouty arthritis 04/23/2021   Primary osteoarthritis 04/23/2021   Abdominal wall pain 04/02/2021   Dysphagia 04/02/2021   Right flank pain 04/02/2021   Wears glasses    Left ureteral calculus    Hypertension    Hyperlipidemia    History of squamous cell carcinoma excision    ED (erectile dysfunction)    Chronic gout    Right sided abdominal pain 01/14/2021   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/24/2020   Acute ischemic stroke ([place]) 03/22/2020   It band syndrome, left 03/19/2020   Metatarsalgia of both feet 03/19/2020   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   Dyspnea on exertion 02/10/2020   Polyarthralgia 07/16/2018   Essential hypertension 07/21/2017   Gout of multiple sites 07/21/2017    PCP: Michael Boston, MD  REFERRING PROVIDER: Leandrew Koyanagi, MD  REFERRING DIAG: M16.11 (ICD-10-CM) - Primary osteoarthritis of right hip M16.12 (ICD-10-CM) - Primary osteoarthritis of left hip   THERAPY DIAG:  Difficulty in walking, not elsewhere classified  Stiffness of left hip, not elsewhere classified  Stiffness of right hip, not elsewhere classified  Muscle weakness (generalized)  Pain in left hip  Pain in right hip  ONSET DATE: About a year  SUBJECTIVE:   SUBJECTIVE STATEMENT: Tyshan notes mornings are much better.  He can still get pain with prolonged standing or walking although this has improved significantly.  PERTINENT HISTORY: Gout, HTN, Hyperlipidemia, previous stroke, B metatarsalgia, B hip  OA  PAIN:  Are you having pain? Yes: NPRS scale: 2-6/10 Pain location: B groin/hips Pain description: Stiff and sore Aggravating factors: Prolonged postures and with weight-bearing Relieving factors: Steroids  PRECAUTIONS: None  WEIGHT BEARING RESTRICTIONS No  FALLS:  Has patient fallen in last 6 months? No  LIVING ENVIRONMENT: Lives with: lives with their family Lives in: House/apartment Stairs:  Difficult Has following equipment at home: None  OCCUPATION: Working from home  PLOF: Independent  PATIENT GOALS Return to walking for exercise, decrease pain and improve function.   OBJECTIVE:   DIAGNOSTIC FINDINGS: Normal thoracic spine MRI.  Lumbar & B hips MRI ordered.  PATIENT SURVEYS:  FOTO 24 (Goal 40)  COGNITION:  Overall cognitive status: Within functional limits for tasks assessed     SENSATION: Not tested  MUSCLE LENGTH: Hamstrings: Right 40 deg; Left 40 deg  POSTURE:  Mildly flexed posture (rounded shoulders, forward head, decreased lumbar lordosis)  LE ROM:  Passive ROM Right 03/28/2022 Left 03/28/2022  Hip flexion 90 90  Hip extension    Hip abduction    Hip adduction    Hip internal rotation 6 15  Hip external rotation 30 25  Knee flexion    Knee extension    Ankle dorsiflexion    Ankle plantarflexion    Ankle inversion    Ankle eversion     (Blank rows = not tested)  LE MMT:  MMT Right 03/28/2022 Left 03/28/2022  Hip flexion    Hip extension    Hip abduction 2+/5 4/5  Hip adduction    Hip internal rotation    Hip external rotation    Knee flexion    Knee extension    Ankle dorsiflexion    Ankle plantarflexion    Ankle inversion    Ankle eversion     (Blank rows = not tested)  GAIT: Distance walked: Within clinic Assistive device utilized: None Level of assistance: Complete Independence Comments: Slow, stiff appearance.      TODAY'S TREATMENT: 04/18/2022 Modified Thomas stretch (1 knee bent and other leg hanging off  treatment table) 4X 20 seconds Figure 4 stretch 4X 20 seconds Clams- B 10X 3 seconds with slow eccentrics Bridging- 10X 5 seconds Side lie hip abduction (1/4 turn toward stomach) 10X 3 seconds B Hip hike in doorframe 2 sets of 5 for 3 seconds   04/11/22 Recumbent bike L3-2 X 6 min Leg press DL 75# X 20, then SL 50# X 15 bilat Standing hip abduction 2# X 15 bilat Standing hip extension 2# X 15 bilat Seated SLR X 10 on left and 2X5 on Rt Supine hip flexor stretch leg off EOB 1 min X 2 bilat Supine piriformis stretch 3 X 30 sec bilat Supine bridge 3 sec X 10   03/28/2022 Modified Thomas stretch (1 knee bent and other leg hanging off treatment table) 4X 20 seconds Figure 4 stretch 4X 20 seconds Clams- B 10X 3 seconds with slow eccentrics   PATIENT EDUCATION:  Education details: See above Person educated:  Patient Education method: Explanation, Demonstration, Tactile cues, Verbal cues, and Handouts Education comprehension: verbalized understanding, returned demonstration, verbal cues required, tactile cues required, and needs further education   HOME EXERCISE PROGRAM: Access Code: [facility] URL: [email or web address] Date: 04/18/2022 Prepared by: Vista Mink  Exercises - Modified Marcello Moores Stretch  - 2-3 x daily - 7 x weekly - 1 sets - 4-5 reps - 20 seconds hold - Supine Figure 4 Piriformis Stretch  - 2-3 x daily - 7 x weekly - 1 sets - 4-5 reps - 20 seconds hold - Yoga Bridge  - 1-2 x daily - 7 x weekly - 1-2 sets - 10 reps - 5 seconds hold - Standing Hip Hiking  - 3-5 x daily - 7 x weekly - 1 sets - 3-5 reps - 3 seconds hold   ASSESSMENT:  CLINICAL IMPRESSION: Majd notes much less stiffness in the mornings and less pain later in the day.  We progressed hip abductors strength today and discussed a possible progress note next visit to determine his readiness for independent PT.   OBJECTIVE IMPAIRMENTS Abnormal gait, decreased activity tolerance, decreased  endurance, decreased knowledge of condition, difficulty walking, decreased ROM, decreased strength, impaired perceived functional ability, impaired flexibility, postural dysfunction, and pain.   ACTIVITY LIMITATIONS community activity and walking .   PERSONAL FACTORS Gout, HTN, Hyperlipidemia, previous stroke, B metatarsalgia, B hip OA are also affecting patient's functional outcome.    REHAB POTENTIAL: Good  CLINICAL DECISION MAKING: Stable/uncomplicated  EVALUATION COMPLEXITY: Low   GOALS: Goals reviewed with patient? Yes  SHORT TERM GOALS: Target date: 04/25/2022   Shaye will be independent with his day 1 HEP. Baseline: Started 03/28/2022 Goal status: Met 04/18/2022  2.  Improve B LE flexibility for hamstrings to 45 degrees; hip flexors to 100 degrees; hip ER to 40 degrees and hip IR to at least 10 degrees. Baseline: 40; 90; 25/30; 15/6 respectively Goal status: On Going 04/18/2022   LONG TERM GOALS: Target date: 05/23/2022   Improve B hip/groin pain to consistently 0-3/10 on the Numeric Pain Rating Scale. Baseline: 2-5/10 on the Numeric Pain Rating Scale Goal status: INITIAL  2.  Improve FOTO to 40 in 15 visits. Baseline: 24 Goal status: INITIAL  3.  Improve B hip abductors strength to 5/5. Baseline: 4/5 and 2+/5 Goal status: INITIAL  4.  Chason will be able to walk a half mile or more without being stopped by B groin/hip pain. Baseline: Short distances are pain limited Goal status: INITIAL  5.  Orel will be independent with his long-term HEP at [place]. Baseline: Started 03/28/2022 Goal status: INITIAL  PLAN: PT FREQUENCY: 1-2x/week  PT DURATION: 8 weeks  PLANNED INTERVENTIONS: Therapeutic exercises, Therapeutic activity, Neuromuscular re-education, Balance training, Gait training, Patient/Family education, Joint mobilization, Stair training, Cryotherapy, and Manual therapy  PLAN FOR NEXT SESSION: Progress note to determine continued supervised PT vs independent  rehabilitation with HEP.  FOTO.   Farley Ly, PT, MPT 04/18/2022, 9:36 AM

## 2022-04-22 ENCOUNTER — Encounter: Payer: Medicare HMO | Admitting: Rehabilitative and Restorative Service Providers"

## 2022-04-24 ENCOUNTER — Encounter: Payer: Self-pay | Admitting: Family Medicine

## 2022-04-25 ENCOUNTER — Encounter: Payer: Medicare HMO | Admitting: Rehabilitative and Restorative Service Providers"

## 2022-04-29 ENCOUNTER — Encounter: Payer: Self-pay | Admitting: Physical Therapy

## 2022-04-29 ENCOUNTER — Ambulatory Visit: Payer: Medicare HMO | Admitting: Physical Therapy

## 2022-04-29 DIAGNOSIS — M25651 Stiffness of right hip, not elsewhere classified: Secondary | ICD-10-CM | POA: Diagnosis not present

## 2022-04-29 DIAGNOSIS — M25652 Stiffness of left hip, not elsewhere classified: Secondary | ICD-10-CM

## 2022-04-29 DIAGNOSIS — R262 Difficulty in walking, not elsewhere classified: Secondary | ICD-10-CM | POA: Diagnosis not present

## 2022-04-29 DIAGNOSIS — M6281 Muscle weakness (generalized): Secondary | ICD-10-CM | POA: Diagnosis not present

## 2022-04-29 DIAGNOSIS — M25552 Pain in left hip: Secondary | ICD-10-CM | POA: Diagnosis not present

## 2022-04-29 DIAGNOSIS — M25551 Pain in right hip: Secondary | ICD-10-CM | POA: Diagnosis not present

## 2022-04-29 NOTE — Therapy (Addendum)
OUTPATIENT PHYSICAL THERAPY TREATMENT/DISCHARGE   [name] MRN: [id number] DOB:01-30-1951, 71 y.o., male Today's Date: 04/29/2022  PHYSICAL THERAPY DISCHARGE SUMMARY  Visits from Start of Care: 4  Current functional level related to goals / functional outcomes: See note   Remaining deficits: See note   Education / Equipment: HEP   Patient agrees to discharge. Patient goals were partially met. Patient is being discharged due to not returning since the last visit.    PT End of Session - 04/29/22 0848     Visit Number 4    Number of Visits 16    Progress Note Due on Visit 10    PT Start Time 0845    PT Stop Time 0928    PT Time Calculation (min) 43 min    Activity Tolerance Patient tolerated treatment well;No increased pain    Behavior During Therapy [facility] for tasks assessed/performed              Past Medical History:  Diagnosis Date   Abdominal wall pain 04/02/2021   Acute ischemic stroke ([place]) 03/22/2020   Avulsed toenail, initial encounter 05/04/2018   Chronic gout    multiple sites--- followed by pcp--- (04-14-2018  per pt last flare-up , march 2019)   Dyspnea on exertion 02/10/2020   ED (erectile dysfunction)    Epidermoid cyst of neck 10/16/2017   Essential hypertension 07/21/2017   Gout 01/19/2014   Gout of multiple sites 07/21/2017   History of kidney stones    History of squamous cell carcinoma excision    Hyperlipidemia    Hypertension    Hypertension not at goal 01/19/2014   It band syndrome, left 03/19/2020   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/24/2020   Left ureteral calculus    Lower abdominal pain 08/27/2015   Metatarsalgia of both feet 03/19/2020   Multiple joint pain 07/16/2018   Pain in joint, shoulder region 12/11/2015   Preventative health care 11/14/2016   Right flank pain 04/02/2021   Right sided abdominal pain 01/14/2021   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   Urgency of urination    URI (upper  respiratory infection) 11/08/2014   Wears glasses    Past Surgical History:  Procedure Laterality Date   COLONOSCOPY WITH PROPOFOL  2017   CYSTOSCOPY/RETROGRADE/URETEROSCOPY/STONE EXTRACTION WITH BASKET Left 04/16/2018   Procedure: CYSTOSCOPY/RETROGRADE/URETEROSCOPY/STONE EXTRACTION WITH BASKET/ HOLMIUM LASER LITHOTRIPSY/ STENT PLACEMENT;  Surgeon: Ardis Hughs, MD;  Location: [facility];  Service: Urology;  Laterality: Left;   HOLMIUM LASER APPLICATION Left 1/46/0479   Procedure: HOLMIUM LASER APPLICATION;  Surgeon: Ardis Hughs, MD;  Location: [facility];  Service: Urology;  Laterality: Left;   Patient Active Problem List   Diagnosis Date Noted   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   Primary osteoarthritis of left hip 03/19/2022   Chronic gouty arthritis 04/23/2021   Primary osteoarthritis 04/23/2021   Abdominal wall pain 04/02/2021   Dysphagia 04/02/2021   Right flank pain 04/02/2021   Wears glasses    Left ureteral calculus    Hypertension    Hyperlipidemia    History of squamous cell carcinoma excision    ED (erectile dysfunction)    Chronic gout    Right sided abdominal pain 01/14/2021   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/24/2020   Acute ischemic stroke ([place]) 03/22/2020   It band syndrome, left 03/19/2020   Metatarsalgia of both feet 03/19/2020   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  Dyspnea on exertion 02/10/2020   Polyarthralgia 07/16/2018   Essential hypertension 07/21/2017   Gout of multiple sites 07/21/2017    PCP: Michael Boston, MD  REFERRING PROVIDER: Leandrew Koyanagi, MD  REFERRING DIAG: M16.11 (ICD-10-CM) - Primary osteoarthritis of right hip M16.12 (ICD-10-CM) - Primary osteoarthritis of left hip   THERAPY DIAG:  Difficulty in walking, not elsewhere classified  Stiffness of left hip, not elsewhere classified  Stiffness of right hip, not elsewhere classified  Muscle  weakness (generalized)  Pain in left hip  Pain in right hip  ONSET DATE: About a year  SUBJECTIVE:   SUBJECTIVE STATEMENT: Kortland states he tweaked his back picking up something and last night the pain was bad. He feels fragile today in his back and is worried about doing too much exercise. He does feel he has made progress with PT and will continue for now until he see's MD again for follow up to determine need for hip replacement.  PERTINENT HISTORY: Gout, HTN, Hyperlipidemia, previous stroke, B metatarsalgia, B hip OA  PAIN:  Are you having pain? Yes: NPRS scale: 2-6/10 Pain location: B groin/hips Pain description: Stiff and sore Aggravating factors: Prolonged postures and with weight-bearing Relieving factors: Steroids  PRECAUTIONS: None  WEIGHT BEARING RESTRICTIONS No  FALLS:  Has patient fallen in last 6 months? No  LIVING ENVIRONMENT: Lives with: lives with their family Lives in: House/apartment Stairs:  Difficult Has following equipment at home: None  OCCUPATION: Working from home  PLOF: Independent  PATIENT GOALS Return to walking for exercise, decrease pain and improve function.   OBJECTIVE:   DIAGNOSTIC FINDINGS: Normal thoracic spine MRI.  Lumbar & B hips MRI ordered.  PATIENT SURVEYS:  FOTO 24 (Goal 40)  COGNITION:  Overall cognitive status: Within functional limits for tasks assessed     SENSATION: Not tested  MUSCLE LENGTH: Hamstrings: Right 40 deg; Left 40 deg  POSTURE:  Mildly flexed posture (rounded shoulders, forward head, decreased lumbar lordosis)  LE ROM:  Passive ROM Right 03/28/2022 Left 03/28/2022  Hip flexion 90 90  Hip extension    Hip abduction    Hip adduction    Hip internal rotation 6 15  Hip external rotation 30 25  Knee flexion    Knee extension    Ankle dorsiflexion    Ankle plantarflexion    Ankle inversion    Ankle eversion     (Blank rows = not tested)  LE MMT:  MMT Right 03/28/2022 Left 03/28/2022   Hip flexion    Hip extension    Hip abduction 2+/5 4/5  Hip adduction    Hip internal rotation    Hip external rotation    Knee flexion    Knee extension    Ankle dorsiflexion    Ankle plantarflexion    Ankle inversion    Ankle eversion     (Blank rows = not tested)  GAIT: Distance walked: Within clinic Assistive device utilized: None Level of assistance: Complete Independence Comments: Slow, stiff appearance.      TODAY'S TREATMENT:   04/29/22  Moist heat and IFC TENS to Rt hip/lumbar in supine with legs elevated X 15 minutes, intensity level to his  tolerance.   Low trunk rotations 10 sec X 5 bilat  Figure 4 stretch  20 sec X 3 reps bilat  Hip adduction isometric 5 sec X 15 bilat  Hip flexion isometric 5 sec X 15 bilat (using 1# bar with contralateral hip extension)  Hip extension isometric 5  sec X 15 bilat (using 1# bar with contralateral hip flexion)  Clam isometric in supine with strap belt around his knees 5 sec X 15  04/18/2022 Modified Thomas stretch (1 knee bent and other leg hanging off treatment table) 4X 20 seconds Figure 4 stretch 4X 20 seconds Clams- B 10X 3 seconds with slow eccentrics Bridging- 10X 5 seconds Side lie hip abduction (1/4 turn toward stomach) 10X 3 seconds B Hip hike in doorframe 2 sets of 5 for 3 seconds   04/11/22 Recumbent bike L3-2 X 6 min Leg press DL 75# X 20, then SL 50# X 15 bilat Standing hip abduction 2# X 15 bilat Standing hip extension 2# X 15 bilat Seated SLR X 10 on left and 2X5 on Rt Supine hip flexor stretch leg off EOB 1 min X 2 bilat Supine piriformis stretch 3 X 30 sec bilat Supine bridge 3 sec X 10    PATIENT EDUCATION:  Education details: See above Person educated: Patient Education method: Explanation, Demonstration, Tactile cues, Verbal cues, and Handouts Education comprehension: verbalized understanding, returned demonstration, verbal cues required, tactile cues required, and needs further  education   HOME EXERCISE PROGRAM: Access Code: [facility] URL: [email or web address] Date: 04/18/2022 Prepared by: Vista Mink  Exercises - Modified Marcello Moores Stretch  - 2-3 x daily - 7 x weekly - 1 sets - 4-5 reps - 20 seconds hold - Supine Figure 4 Piriformis Stretch  - 2-3 x daily - 7 x weekly - 1 sets - 4-5 reps - 20 seconds hold - Yoga Bridge  - 1-2 x daily - 7 x weekly - 1-2 sets - 10 reps - 5 seconds hold - Standing Hip Hiking  - 3-5 x daily - 7 x weekly - 1 sets - 3-5 reps - 3 seconds hold   ASSESSMENT:  CLINICAL IMPRESSION: Symon notes he aggravated his hip and back with too much activity over the weekend. He states he is worried about doing too much exercise today since he is flared up. I first used heat and IFC TENS to reduce overall pain and muscle tension then I had him switch over to gentle isometric exercise so he can still strengthen but without having to go into pain ranges. He showed good understanding of these and will work on these until pain is relieved some before we attempt further activity progression. He does relay he feels much better at end of his session today.    OBJECTIVE IMPAIRMENTS Abnormal gait, decreased activity tolerance, decreased endurance, decreased knowledge of condition, difficulty walking, decreased ROM, decreased strength, impaired perceived functional ability, impaired flexibility, postural dysfunction, and pain.   ACTIVITY LIMITATIONS community activity and walking .   PERSONAL FACTORS Gout, HTN, Hyperlipidemia, previous stroke, B metatarsalgia, B hip OA are also affecting patient's functional outcome.    REHAB POTENTIAL: Good  CLINICAL DECISION MAKING: Stable/uncomplicated  EVALUATION COMPLEXITY: Low   GOALS: Goals reviewed with patient? Yes  SHORT TERM GOALS: Target date: 04/25/2022   Jelan will be independent with his day 1 HEP. Baseline: Started 03/28/2022 Goal status: Met 04/18/2022  2.  Improve B LE flexibility for  hamstrings to 45 degrees; hip flexors to 100 degrees; hip ER to 40 degrees and hip IR to at least 10 degrees. Baseline: 40; 90; 25/30; 15/6 respectively Goal status: On Going 04/18/2022   LONG TERM GOALS: Target date: 05/23/2022   Improve B hip/groin pain to consistently 0-3/10 on the Numeric Pain Rating Scale. Baseline: 2-5/10 on the Numeric Pain Rating Scale Goal status:  INITIAL  2.  Improve FOTO to 40 in 15 visits. Baseline: 24 Goal status: INITIAL  3.  Improve B hip abductors strength to 5/5. Baseline: 4/5 and 2+/5 Goal status: INITIAL  4.  Saif will be able to walk a half mile or more without being stopped by B groin/hip pain. Baseline: Short distances are pain limited Goal status: INITIAL  5.  Jahron will be independent with his long-term HEP at [place]. Baseline: Started 03/28/2022 Goal status: INITIAL  PLAN: PT FREQUENCY: 1-2x/week  PT DURATION: 8 weeks  PLANNED INTERVENTIONS: Therapeutic exercises, Therapeutic activity, Neuromuscular re-education, Balance training, Gait training, Patient/Family education, Joint mobilization, Stair training, Cryotherapy, and Manual therapy  PLAN FOR NEXT SESSION: Progress note as he has MD follow up and to determine continued supervised PT vs independent rehabilitation with HEP.  FOTO.   Debbe Odea, PT, DPT 04/29/2022, 9:31 AM   Farley Ly PT, MPT 06/18/2022

## 2022-05-01 DIAGNOSIS — H524 Presbyopia: Secondary | ICD-10-CM | POA: Diagnosis not present

## 2022-05-02 ENCOUNTER — Encounter: Payer: Medicare HMO | Admitting: Rehabilitative and Restorative Service Providers"

## 2022-05-08 DIAGNOSIS — G629 Polyneuropathy, unspecified: Secondary | ICD-10-CM | POA: Diagnosis not present

## 2022-05-09 ENCOUNTER — Ambulatory Visit: Payer: Medicare HMO | Admitting: Orthopaedic Surgery

## 2022-05-09 ENCOUNTER — Ambulatory Visit (INDEPENDENT_AMBULATORY_CARE_PROVIDER_SITE_OTHER): Payer: Medicare HMO

## 2022-05-09 ENCOUNTER — Encounter: Payer: Self-pay | Admitting: Orthopaedic Surgery

## 2022-05-09 DIAGNOSIS — M1611 Unilateral primary osteoarthritis, right hip: Secondary | ICD-10-CM | POA: Diagnosis not present

## 2022-05-13 DIAGNOSIS — G5790 Unspecified mononeuropathy of unspecified lower limb: Secondary | ICD-10-CM | POA: Diagnosis not present

## 2022-05-13 DIAGNOSIS — M25551 Pain in right hip: Secondary | ICD-10-CM | POA: Diagnosis not present

## 2022-05-13 DIAGNOSIS — R109 Unspecified abdominal pain: Secondary | ICD-10-CM | POA: Diagnosis not present

## 2022-05-15 DIAGNOSIS — G4733 Obstructive sleep apnea (adult) (pediatric): Secondary | ICD-10-CM | POA: Diagnosis not present

## 2022-05-16 DIAGNOSIS — G4733 Obstructive sleep apnea (adult) (pediatric): Secondary | ICD-10-CM | POA: Diagnosis not present

## 2022-05-21 ENCOUNTER — Telehealth: Payer: Self-pay

## 2022-05-21 NOTE — Telephone Encounter (Signed)
   [facility] Pre-operative Risk Assessment    Request for surgical clearance:  What type of surgery is being performed? : Right Total Hip Arthroplasty    When is this surgery scheduled? 06/30/2022   What type of clearance is required (medical clearance vs. Pharmacy clearance to hold med vs. Both)? Both  Are there any medications that need to be held prior to surgery and how long? Not specified    Practice name and name of physician performing surgery? Dr. Eduard Roux    What is your office phone number: [phone]    7.   What is your office fax number: [phone]  8.   Anesthesia type (None, local, MAC, general) ? Spinal Anesthesia    Thomas [name] 05/21/2022, 12:33 PM  _________________________________________________________________   (provider comments below)

## 2022-05-21 NOTE — Telephone Encounter (Signed)
   Name: Thomas [name]  DOB: October 05, 1951  MRN: [id number]  Primary Cardiologist: Jenne Campus, MD  Chart reviewed as part of pre-operative protocol coverage. Because of Thomas [name]'s past medical history and time since last visit, he will require a follow-up in-office visit in order to better assess preoperative cardiovascular risk.  Pre-op covering staff: - Please schedule appointment and call patient to inform them. If patient already had an upcoming appointment within acceptable timeframe, please add "pre-op clearance" to the appointment notes so provider is aware. - Please contact requesting surgeon's office via preferred method (i.e, phone, fax) to inform them of need for appointment prior to surgery.  Patient takes aspirin 81 mg daily for history of stroke.  Aspirin is not prescribed by cardiology.  Therefore, recommendations for holding aspirin prior to surgery should come from the prescribing provider (neurology).  Lenna Sciara, NP  05/21/2022, 5:10 PM

## 2022-05-21 NOTE — Telephone Encounter (Signed)
Pt has appt 06/06/22 with Richardson Dopp, PAC. I have added need pre op clearance to appt notes . Will forward notes to [facility] for appt. Will send FYI to requesting office the  pt has appt

## 2022-05-23 ENCOUNTER — Other Ambulatory Visit: Payer: Self-pay | Admitting: Cardiology

## 2022-05-23 NOTE — Telephone Encounter (Signed)
Rx refill sent to pharmacy. 

## 2022-05-29 ENCOUNTER — Other Ambulatory Visit: Payer: Self-pay

## 2022-06-02 ENCOUNTER — Ambulatory Visit: Payer: Medicare HMO | Admitting: Physician Assistant

## 2022-06-05 DIAGNOSIS — Z01810 Encounter for preprocedural cardiovascular examination: Secondary | ICD-10-CM | POA: Insufficient documentation

## 2022-06-05 NOTE — Progress Notes (Signed)
Cardiology Office Note:    Date:  06/06/2022   ID:  Thomas [name], DOB 05/27/1951, MRN [id number]  PCP:  Michael Boston, MD  [facility] Cardiologist:  Jenne Campus, MD    Referring MD: Michael Boston, MD   Chief Complaint:  Surgical clearance    Patient Profile: Hypertension, difficult to control Hx of CVA Hyperlipidemia Gout Superior mesenteric artery stenosis  Prior CV Studies: Limited echocardiogram 03/23/2020:  EF 60-65, no RWMA, mild LVH, normal RVSF, trivial MR Echocardiogram 02/14/2020: EF 60-65, no RWMA, mild LVH, GR 1 DD, normal RVSF, normal PASP, mild MR Renal artery [place] 02/14/2020: No renal artery stenosis bilaterally; superior mesenteric artery 70-99   History of Present Illness:   Thomas [name] is a 71 y.o. male with the above problem list.  He was last seen in July 2022.  He returns for surgical clearance.  He needs a right hip replacement on June 30, 2022 with Dr. Erlinda Hong under spinal anesthesia.  He is here alone.  He has not had chest pain, shortness of breath, syncope, orthopnea, leg edema.  His hip pain does limit his activities.  However, he is able to carry groceries, move furniture and vacuum.  He would like to discuss referral to the lipid clinic for alternative therapies to statins.  He continues to see blood pressures at home in the 300-762 range (systolically).    Past Medical History:  Diagnosis Date   Abdominal wall pain 04/02/2021   Acute ischemic stroke ([place]) 03/22/2020   Avulsed toenail, initial encounter 05/04/2018   Chronic gout    multiple sites--- followed by pcp--- (04-14-2018  per pt last flare-up , march 2019)   Dyspnea on exertion 02/10/2020   ED (erectile dysfunction)    Epidermoid cyst of neck 10/16/2017   Essential hypertension 07/21/2017   Gout 01/19/2014   Gout of multiple sites 07/21/2017   History of kidney stones    History of squamous cell carcinoma excision    Hyperlipidemia    Hypertension    Hypertension not at  goal 01/19/2014   It band syndrome, left 03/19/2020   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/24/2020   Left ureteral calculus    Lower abdominal pain 08/27/2015   Metatarsalgia of both feet 03/19/2020   Multiple joint pain 07/16/2018   Pain in joint, shoulder region 12/11/2015   Preventative health care 11/14/2016   Right flank pain 04/02/2021   Right sided abdominal pain 01/14/2021   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 1 and 1/2 tablet by mouth daily   amLODipine (NORVASC) 5 MG tablet Take 1 tablet (5 mg total) by mouth daily.   gabapentin (NEURONTIN) 300 MG capsule Patient take 3 capsules by mouth in the morning   hydrochlorothiazide (HYDRODIURIL) 25 MG tablet TAKE 1 TABLET (25 MG TOTAL) BY MOUTH DAILY.   lidocaine (XYLOCAINE) 5 % ointment Apply 1 application topically as needed.   minoxidil (LONITEN) 10 MG tablet Take 10 mg in the morning, take 5 mg at night.   pantoprazole (PROTONIX) 40 MG tablet TAKE 1 TABLET BY MOUTH TWICE A DAY   predniSONE (DELTASONE) 10 MG tablet Take 30 mg by mouth daily.   traMADol (ULTRAM) 50 MG tablet Patient take 3 tablets by mouth  in the evening as needed for severe pain or moderate pain   valsartan (DIOVAN) 320 MG tablet TAKE 1 TABLET BY MOUTH EVERY DAY    Allergies:   Statins   Social History   Tobacco Use   Smoking status: Former    Packs/day: 0.30    Years: 20.00    Total pack years: 6.00    Types: Cigarettes    Quit date: 10/17/1994    Years since quitting: 27.6   Smokeless tobacco: Never  Substance Use Topics   Alcohol use: Not Currently   Drug use: No    Family Hx: The patient's family history includes Alcohol abuse in his mother; Cancer in his sister; Depression in his mother; Heart disease (age of onset: 38) in  his mother; Hypertension in his father; Sleep apnea in his father. There is no history of Colon cancer, Pancreatic cancer, Rectal cancer, Stomach cancer, Esophageal cancer, Inflammatory bowel disease, or Liver disease.  Review of Systems  Gastrointestinal:  Negative for hematochezia.  Genitourinary:  Negative for hematuria.     EKGs/Labs/Other Test Reviewed:    EKG:  EKG is   ordered today.  The ekg ordered today demonstrates NSR, HR 86, inferior Q waves, septal Q waves, nonspecific ST-T wave changes, QTc 428  Recent Labs: 06/07/2021: BUN 17; Creatinine, Ser 1.36; Potassium 4.2; Sodium 140   Recent Lipid Panel Recent Labs    06/07/21 0952  CHOL 201*  TRIG 114  HDL 41  LDLCALC 139*     Risk Assessment/Calculations/Metrics:         HYPERTENSION CONTROL Vitals:   06/06/22 0847 06/06/22 1057  BP: (!) 184/70 (!) 166/88    The patient's blood pressure is elevated above target today.  The following intervention was performed to address the patient's elevated BP:  - A new medication was prescribed today.       Physical Exam:    VS:  BP (!) 166/88   Pulse 86   Ht 5\' 10"  (1.778 m)   Wt 227 lb 9.6 oz (103.2 kg)   SpO2 97%   BMI 32.66 kg/m     Wt Readings from Last 3 Encounters:  06/06/22 227 lb 9.6 oz (103.2 kg)  03/31/22 225 lb (102.1 kg)  01/09/22 221 lb (100.2 kg)    Constitutional:      Appearance: Healthy appearance. Not in distress.  Neck:     Vascular: No JVR. JVD normal.  Pulmonary:     Effort: Pulmonary effort is normal.     Breath sounds: No wheezing. No rales.  Cardiovascular:     Normal rate. Regular rhythm. Normal S1. Normal S2.      Murmurs: There is no murmur.  Edema:    Peripheral edema absent.  Abdominal:     Palpations: Abdomen is soft.  Skin:    General: Skin is warm and dry.  Neurological:     General: No focal deficit present.     Mental Status: Alert and oriented to person, place and time.         ASSESSMENT & PLAN:   Preoperative  cardiovascular examination Thomas [name] perioperative risk of a major cardiac event is 0.9% according to the Revised Cardiac Risk Index (RCRI).  Therefore, he is at low risk for perioperative complications.   His functional capacity is fair at 4.06 METs according to the Duke Activity Status Index (DASI). Recommendations: According to ACC/AHA guidelines, no further cardiovascular testing needed.  The patient may proceed to surgery at acceptable risk.  Antiplatelet and/or Anticoagulation Recommendations: From a cardiac standpoint, aspirin can be held for 7 days prior to his surgery.  Please resume Aspirin post operatively when it is felt to be safe from a bleeding standpoint.    Hypertension Difficult to control.  He continues to have elevated blood pressures at home.  I reviewed his medication history.  He notes that he had side effects to metoprolol in the past.  He is not certain if he ever took clonidine.  He has not taken amlodipine, spironolactone or hydralazine.  He is on maximum dose valsartan.  His rheumatologist recently has asked him to follow-up with cardiology to see if he can get off of hydrochlorothiazide.  He does admit to a diet rich in salt. Low-salt diet Continue minoxidil 10 mg in the morning, 5 mg in the evening and valsartan 320 mg daily and HCTZ 25 mg daily Start amlodipine 5 mg daily Consider discontinuing HCTZ if blood pressure improves over time Consider referral to the advanced hypertension clinic if blood pressure continues to be difficult to control Follow-up with Dr. Agustin Cree in 3 months.  Hyperlipidemia He has been intolerant to statins in the past.  He does have a history of stroke and his goal LDL is <70.  We discussed the possibility of starting ezetimibe versus referral to our Pharm.D. lipid clinic for consideration of PCSK9 inhibitor therapy.  He would like to pursue PCSK9 inhibitor therapy.  He does have his hip surgery upcoming.  I will refer him to our lipid  clinic to be seen sometime late September or early October.  Chronic gout He notes his rheumatologist would like to get him off of HCTZ.  He has not had a gout flareup in about a year.  As his blood pressure is uncontrolled, I would recommend continuing HCTZ for now with a plan to discontinue it if we can get his blood pressure to come down further.           Dispo:  Return in about 3 months (around 09/06/2022) for Routine Follow Up with Dr. Agustin Cree in [facility].   Medication Adjustments/Labs and Tests Ordered: Current medicines are reviewed at length with the patient today.  Concerns regarding medicines are outlined above.  Tests Ordered: Orders Placed This Encounter  Procedures   AMB Referral to [facility] Pharm-D   EKG 12-Lead   Medication Changes: Meds ordered this encounter  Medications   amLODipine (NORVASC) 5 MG tablet    Sig: Take 1 tablet (5 mg total) by mouth daily.    Dispense:  190 tablet    Refill:  3   Signed, Richardson Dopp, PA-C  06/06/2022 11:05 AM    [facility], [place], [place]  [address] Phone: [phone]; Fax: [phone]

## 2022-06-06 ENCOUNTER — Encounter: Payer: Self-pay | Admitting: Physician Assistant

## 2022-06-06 ENCOUNTER — Ambulatory Visit: Payer: Medicare HMO | Admitting: Physician Assistant

## 2022-06-06 VITALS — BP 166/88 | HR 86 | Ht 70.0 in | Wt 227.6 lb

## 2022-06-06 DIAGNOSIS — I10 Essential (primary) hypertension: Secondary | ICD-10-CM

## 2022-06-06 DIAGNOSIS — M1A9XX0 Chronic gout, unspecified, without tophus (tophi): Secondary | ICD-10-CM

## 2022-06-06 DIAGNOSIS — E781 Pure hyperglyceridemia: Secondary | ICD-10-CM

## 2022-06-06 DIAGNOSIS — Z01810 Encounter for preprocedural cardiovascular examination: Secondary | ICD-10-CM

## 2022-06-06 MED ORDER — AMLODIPINE BESYLATE 5 MG PO TABS: 5.0000 mg | ORAL_TABLET | Freq: Every day | ORAL | 3 refills | Status: DC

## 2022-06-06 NOTE — Assessment & Plan Note (Signed)
Difficult to control.  He continues to have elevated blood pressures at home.  I reviewed his medication history.  He notes that he had side effects to metoprolol in the past.  He is not certain if he ever took clonidine.  He has not taken amlodipine, spironolactone or hydralazine.  He is on maximum dose valsartan.  His rheumatologist recently has asked him to follow-up with cardiology to see if he can get off of hydrochlorothiazide.  He does admit to a diet rich in salt.  Low-salt diet  Continue minoxidil 10 mg in the morning, 5 mg in the evening and valsartan 320 mg daily and HCTZ 25 mg daily  Start amlodipine 5 mg daily  Consider discontinuing HCTZ if blood pressure improves over time  Consider referral to the advanced hypertension clinic if blood pressure continues to be difficult to control  Follow-up with Dr. Agustin Cree in 3 months.

## 2022-06-06 NOTE — Assessment & Plan Note (Signed)
He has been intolerant to statins in the past.  He does have a history of stroke and his goal LDL is <70.  We discussed the possibility of starting ezetimibe versus referral to our Pharm.D. lipid clinic for consideration of PCSK9 inhibitor therapy.  He would like to pursue PCSK9 inhibitor therapy.  He does have his hip surgery upcoming.  I will refer him to our lipid clinic to be seen sometime late September or early October.

## 2022-06-06 NOTE — Patient Instructions (Signed)
Medication Instructions:  Your physician has recommended you make the following change in your medication:   START Amlodipine 5 mg taking 1 daily  *If you need a refill on your cardiac medications before your next appointment, please call your pharmacy*   Lab Work: None ordered  If you have labs (blood work) drawn today and your tests are completely normal, you will receive your results only by: [place] (if you have MyChart) OR A paper copy in the mail If you have any lab test that is abnormal or we need to change your treatment, we will call you to review the results.   Testing/Procedures: None ordered    You have been referred to [facility] for managing Cholesterol.  Follow-Up: At [facility], you and your health needs are our priority.  As part of our continuing mission to provide you with exceptional heart care, we have created designated Provider Care Teams.  These Care Teams include your primary Cardiologist (physician) and Advanced Practice Providers (APPs -  Physician Assistants and Nurse Practitioners) who all work together to provide you with the care you need, when you need it.  We recommend signing up for the patient portal called "MyChart".  Sign up information is provided on this After Visit Summary.  MyChart is used to connect with patients for Virtual Visits (Telemedicine).  Patients are able to view lab/test results, encounter notes, upcoming appointments, etc.  Non-urgent messages can be sent to your provider as well.   To learn more about what you can do with MyChart, go to [email or web address].    Your next appointment:   3 month(s)  The format for your next appointment:   In Person  Provider:   Jenne Campus, MD    Other Instructions   Important Information About Sugar

## 2022-06-06 NOTE — Assessment & Plan Note (Addendum)
Mr. Bertsch perioperative risk of a major cardiac event is 0.9% according to the Revised Cardiac Risk Index (RCRI).  Therefore, he is at low risk for perioperative complications.   His functional capacity is fair at 4.06 METs according to the [facility] Activity Status Index (DASI). Recommendations: According to ACC/AHA guidelines, no further cardiovascular testing needed.  The patient may proceed to surgery at acceptable risk.   Antiplatelet and/or Anticoagulation Recommendations: From a cardiac standpoint, aspirin can be held for 7 days prior to his surgery.  Please resume Aspirin post operatively when it is felt to be safe from a bleeding standpoint.

## 2022-06-06 NOTE — Assessment & Plan Note (Signed)
He notes his rheumatologist would like to get him off of HCTZ.  He has not had a gout flareup in about a year.  As his blood pressure is uncontrolled, I would recommend continuing HCTZ for now with a plan to discontinue it if we can get his blood pressure to come down further.

## 2022-06-06 NOTE — Telephone Encounter (Signed)
Notes faxed to PCP Richardson Dopp, PA-C 06/06/2022  1:40 PM

## 2022-06-14 DIAGNOSIS — G4733 Obstructive sleep apnea (adult) (pediatric): Secondary | ICD-10-CM | POA: Diagnosis not present

## 2022-06-15 DIAGNOSIS — G4733 Obstructive sleep apnea (adult) (pediatric): Secondary | ICD-10-CM | POA: Diagnosis not present

## 2022-06-18 NOTE — Progress Notes (Signed)
Surgical Instructions    Your procedure is scheduled on Monday, 06/30/22.  Report to [facility] Main Entrance "A" at 5:30 A.M., then check in with the Admitting office.  Call this number if you have problems the morning of surgery:  [phone]   If you have any questions prior to your surgery date call [phone]: Open Monday-Friday 8am-4pm    Remember:  Do not eat after midnight the night before your surgery  You may drink clear liquids until 4:15am the morning of your surgery.   Clear liquids allowed are: Water, Non-Citrus Juices (without pulp), Carbonated Beverages, Clear Tea, Black Coffee ONLY (NO MILK, CREAM OR POWDERED CREAMER of any kind), and Gatorade  Patient Instructions  The night before surgery:  No food after midnight. ONLY clear liquids after midnight  The day of surgery (if you do NOT have diabetes):  Drink ONE (1) Pre-Surgery Clear Ensure by 4:15am the morning of surgery. Drink in one sitting. Do not sip.  This drink was given to you during your hospital  pre-op appointment visit. Nothing else to drink after completing the  Pre-Surgery Clear Ensure.           If you have questions, please contact your surgeon's office.     Take these medicines the morning of surgery with A SIP OF WATER:  allopurinol (ZYLOPRIM)  amLODipine (NORVASC) hydrochlorothiazide (HYDRODIURIL)  pantoprazole (PROTONIX)  predniSONE (DELTASONE)  As of today, STOP taking any Aspirin (unless otherwise instructed by your surgeon) Aleve, Naproxen, Ibuprofen, Motrin, Advil, Goody's, BC's, all herbal medications, fish oil, and all vitamins.           Do not wear jewelry or makeup. Do not wear lotions, powders, perfumes/cologne or deodorant. Men may shave face and neck. Do not bring valuables to the hospital. Do not wear nail polish, gel polish, artificial nails, or any other type of covering on natural nails (fingers and toes) If you have artificial nails or gel coating that need to be  removed by a nail salon, please have this removed prior to surgery. Artificial nails or gel coating may interfere with anesthesia's ability to adequately monitor your vital signs.  [facility] is not responsible for any belongings or valuables.    Do NOT Smoke (Tobacco/Vaping)  24 hours prior to your procedure  If you use a CPAP at night, you may bring your mask for your overnight stay.   Contacts, glasses, hearing aids, dentures or partials may not be worn into surgery, please bring cases for these belongings   For patients admitted to the hospital, discharge time will be determined by your treatment team.   Patients discharged the day of surgery will not be allowed to drive home, and someone needs to stay with them for 24 hours.   SURGICAL WAITING ROOM VISITATION Patients having surgery or a procedure may have no more than 2 support people in the waiting area - these visitors may rotate.   Children under the age of 39 must have an adult with them who is not the patient. If the patient needs to stay at the hospital during part of their recovery, the visitor guidelines for inpatient rooms apply. Pre-op nurse will coordinate an appropriate time for 1 support person to accompany patient in pre-op.  This support person may not rotate.   Please refer to the [facility] website for the visitor guidelines for Inpatients (after your surgery is over and you are in a regular room).    Special instructions:  Oral Hygiene is also important to reduce your risk of infection.  Remember - BRUSH YOUR TEETH THE MORNING OF SURGERY WITH YOUR REGULAR TOOTHPASTE   [place]- Preparing For Surgery  Before surgery, you can play an important role. Because skin is not sterile, your skin needs to be as free of germs as possible. You can reduce the number of germs on your skin by washing with CHG (chlorahexidine gluconate) Soap before surgery.  CHG is an antiseptic cleaner which kills germs and bonds with the  skin to continue killing germs even after washing.     Please do not use if you have an allergy to CHG or antibacterial soaps. If your skin becomes reddened/irritated stop using the CHG.  Do not shave (including legs and underarms) for at least 48 hours prior to first CHG shower. It is OK to shave your face.  Please follow these instructions carefully.     Shower the NIGHT BEFORE SURGERY and the MORNING OF SURGERY with CHG Soap.   If you chose to wash your hair, wash your hair first as usual with your normal shampoo. After you shampoo, rinse your hair and body thoroughly to remove the shampoo.  Then [employer] and genitals (private parts) with your normal soap and rinse thoroughly to remove soap.  After that Use CHG Soap as you would any other liquid soap. You can apply CHG directly to the skin and wash gently with a scrungie or a clean washcloth.   Apply the CHG Soap to your body ONLY FROM THE NECK DOWN.  Do not use on open wounds or open sores. Avoid contact with your eyes, ears, mouth and genitals (private parts). Wash Face and genitals (private parts)  with your normal soap.   Wash thoroughly, paying special attention to the area where your surgery will be performed.  Thoroughly rinse your body with warm water from the neck down.  DO NOT shower/wash with your normal soap after using and rinsing off the CHG Soap.  Pat yourself dry with a CLEAN TOWEL.  Wear CLEAN PAJAMAS to bed the night before surgery  Place CLEAN SHEETS on your bed the night before your surgery  DO NOT SLEEP WITH PETS.   Day of Surgery: Take a shower with CHG soap. Wear Clean/Comfortable clothing the morning of surgery Do not apply any deodorants/lotions.   Remember to brush your teeth WITH YOUR REGULAR TOOTHPASTE.    If you received a COVID test during your pre-op visit, it is requested that you wear a mask when out in public, stay away from anyone that may not be feeling well, and notify your surgeon if  you develop symptoms. If you have been in contact with anyone that has tested positive in the last 10 days, please notify your surgeon.    Please read over the following fact sheets that you were given.

## 2022-06-19 ENCOUNTER — Other Ambulatory Visit: Payer: Self-pay

## 2022-06-19 ENCOUNTER — Encounter (HOSPITAL_COMMUNITY)
Admission: RE | Admit: 2022-06-19 | Discharge: 2022-06-19 | Disposition: A | Discharge status: Home / Self Care | Payer: Medicare HMO | Source: Ambulatory Visit | Attending: Orthopaedic Surgery | Admitting: Orthopaedic Surgery

## 2022-06-19 ENCOUNTER — Encounter (HOSPITAL_COMMUNITY): Payer: Self-pay

## 2022-06-19 VITALS — BP 148/73 | HR 78 | Temp 98.2°F | Resp 17 | Ht 70.0 in | Wt 227.6 lb

## 2022-06-19 DIAGNOSIS — Z8673 Personal history of transient ischemic attack (TIA), and cerebral infarction without residual deficits: Secondary | ICD-10-CM | POA: Insufficient documentation

## 2022-06-19 DIAGNOSIS — Z79899 Other long term (current) drug therapy: Secondary | ICD-10-CM | POA: Diagnosis not present

## 2022-06-19 DIAGNOSIS — Z01812 Encounter for preprocedural laboratory examination: Secondary | ICD-10-CM | POA: Diagnosis not present

## 2022-06-19 DIAGNOSIS — Z01818 Encounter for other preprocedural examination: Secondary | ICD-10-CM

## 2022-06-19 DIAGNOSIS — M109 Gout, unspecified: Secondary | ICD-10-CM | POA: Insufficient documentation

## 2022-06-19 DIAGNOSIS — G629 Polyneuropathy, unspecified: Secondary | ICD-10-CM | POA: Insufficient documentation

## 2022-06-19 DIAGNOSIS — E785 Hyperlipidemia, unspecified: Secondary | ICD-10-CM | POA: Insufficient documentation

## 2022-06-19 DIAGNOSIS — Z7952 Long term (current) use of systemic steroids: Secondary | ICD-10-CM | POA: Diagnosis not present

## 2022-06-19 DIAGNOSIS — K219 Gastro-esophageal reflux disease without esophagitis: Secondary | ICD-10-CM | POA: Diagnosis not present

## 2022-06-19 DIAGNOSIS — I10 Essential (primary) hypertension: Secondary | ICD-10-CM | POA: Diagnosis not present

## 2022-06-19 DIAGNOSIS — M1611 Unilateral primary osteoarthritis, right hip: Secondary | ICD-10-CM | POA: Insufficient documentation

## 2022-06-19 HISTORY — DX: Gastro-esophageal reflux disease without esophagitis: K21.9

## 2022-06-19 LAB — CBC
HCT: 44 % (ref 39.0–52.0)
Hemoglobin: 14.6 g/dL (ref 13.0–17.0)
MCH: 29 pg (ref 26.0–34.0)
MCHC: 33.2 g/dL (ref 30.0–36.0)
MCV: 87.5 fL (ref 80.0–100.0)
Platelets: 268 10*3/uL (ref 150–400)
RBC: 5.03 MIL/uL (ref 4.22–5.81)
RDW: 14.2 % (ref 11.5–15.5)
WBC: 8.7 10*3/uL (ref 4.0–10.5)
nRBC: 0 % (ref 0.0–0.2)

## 2022-06-19 LAB — COMPREHENSIVE METABOLIC PANEL
ALT: 23 U/L (ref 0–44)
AST: 22 U/L (ref 15–41)
Albumin: 4.4 g/dL (ref 3.5–5.0)
Alkaline Phosphatase: 68 U/L (ref 38–126)
Anion gap: 7 (ref 5–15)
BUN: 21 mg/dL (ref 8–23)
CO2: 31 mmol/L (ref 22–32)
Calcium: 9.7 mg/dL (ref 8.9–10.3)
Chloride: 100 mmol/L (ref 98–111)
Creatinine, Ser: 1.3 mg/dL — ABNORMAL HIGH (ref 0.61–1.24)
GFR, Estimated: 59 mL/min — ABNORMAL LOW (ref >60)
Glucose, Bld: 110 mg/dL — ABNORMAL HIGH (ref 70–99)
Potassium: 3.8 mmol/L (ref 3.5–5.1)
Sodium: 138 mmol/L (ref 135–145)
Total Bilirubin: 0.6 mg/dL (ref 0.3–1.2)
Total Protein: 7.5 g/dL (ref 6.5–8.1)

## 2022-06-19 LAB — TYPE AND SCREEN
ABO/RH(D): O NEG
Antibody Screen: NEGATIVE

## 2022-06-19 LAB — SURGICAL PCR SCREEN
MRSA, PCR: NEGATIVE
Staphylococcus aureus: POSITIVE — AB

## 2022-06-19 NOTE — Progress Notes (Signed)
PCP - Dr. Cristie Hem  Cardiologist - Dr. Jenne Campus  PPM/ICD - denies   Chest x-ray - 03/22/20 EKG - 06/06/22 Stress Test - denies ECHO - 03/23/20 Cardiac Cath - denies  Sleep Study - 2022, OSA+ CPAP - nightly   DM- denies  ASA/Blood Thinner Instructions: denies   ERAS Protcol - yes PRE-SURGERY Ensure given at PAT  COVID TEST- n/a   Anesthesia review: yes, cardiac hx  Patient denies shortness of breath, fever, cough and chest pain at PAT appointment   All instructions explained to the patient, with a verbal understanding of the material. Patient agrees to go over the instructions while at home for a better understanding.  The opportunity to ask questions was provided.

## 2022-06-20 ENCOUNTER — Encounter (HOSPITAL_COMMUNITY): Payer: Self-pay

## 2022-06-20 NOTE — Anesthesia Preprocedure Evaluation (Addendum)
Anesthesia Evaluation  Patient identified by MRN, date of birth, ID band Patient awake    Reviewed: Allergy & Precautions, NPO status , Patient's Chart, lab work & pertinent test results  Airway Mallampati: II  TM Distance: >3 FB Neck ROM: Full    Dental  (+) Dental Advisory Given   Pulmonary former smoker,    breath sounds clear to auscultation       Cardiovascular hypertension, Pt. on medications + Peripheral Vascular Disease   Rhythm:Regular Rate:Normal     Neuro/Psych CVA    GI/Hepatic Neg liver ROS, GERD  ,  Endo/Other  negative endocrine ROS  Renal/GU Renal InsufficiencyRenal disease     Musculoskeletal   Abdominal   Peds  Hematology negative hematology ROS (+)   Anesthesia Other Findings   Reproductive/Obstetrics                           Lab Results  Component Value Date   WBC 8.7 06/19/2022   HGB 14.6 06/19/2022   HCT 44.0 06/19/2022   MCV 87.5 06/19/2022   PLT 268 06/19/2022   Lab Results  Component Value Date   CREATININE 1.30 (H) 06/19/2022   BUN 21 06/19/2022   NA 138 06/19/2022   K 3.8 06/19/2022   CL 100 06/19/2022   CO2 31 06/19/2022    Anesthesia Physical Anesthesia Plan  ASA: 3  Anesthesia Plan: Spinal   Post-op Pain Management: Tylenol PO (pre-op)*   Induction:   PONV Risk Score and Plan: 1 and Propofol infusion, Ondansetron and Treatment may vary due to age or medical condition  Airway Management Planned: Natural Airway and Simple Face Mask  Additional Equipment: None  Intra-op Plan:   Post-operative Plan:   Informed Consent: I have reviewed the patients History and Physical, chart, labs and discussed the procedure including the risks, benefits and alternatives for the proposed anesthesia with the patient or authorized representative who has indicated his/her understanding and acceptance.       Plan Discussed with:   Anesthesia Plan  Comments: ( )       Anesthesia Quick Evaluation

## 2022-06-20 NOTE — Progress Notes (Signed)
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 BMI is consistent with obesity.  Last cardiology evaluation was on 06/06/22 by Richardson Dopp, PA-C.  He was seen for preoperative evaluation and follow-up for difficult to control HTN.  Rheumatologist was hoping patient could come off HCTZ. Amlodipine 5 mg started with consideration of discontinuing HCTZ if blood pressure improves over time.  Consider referral to [facility] if BP remains difficult to control.  Patient also requested referral to the [facility] for consideration of alternative therapies to statins.  He denied chest pain and SOB. Hip pain limited activity but still able to move furniture, carry groceries, and vacuum. ther For Preoperative CV Risk he wrote,  "Mr. Dasch's perioperative risk of a major cardiac event is 0.9% according to the Revised Cardiac Risk Index (RCRI).  Therefore, he is at low risk for perioperative complications.   His functional capacity is fair at 4.06 METs according to the [facility] Activity Status Index (DASI). Recommendations: According to ACC/AHA guidelines, no further cardiovascular testing needed.  The patient may proceed to surgery at acceptable risk.   Antiplatelet and/or Anticoagulation Recommendations: From a cardiac standpoint, aspirin can be held for 7 days prior to his surgery.  Please resume Aspirin post operatively when it is felt to be safe from a bleeding standpoint."    Per 05/08/22 [facility] visit with Dr. Warnell Forester for idiopathic sensorimotor polyneuropathy, "He continues to have significant hip pain that has been relieved by a steroid injection, which suggest a degenerative component.  At this time, I do not think it is in his best interest to continue the prednisone as he is accumulating adverse effects in the process is not clearly immune mediated." He had been on prednisone 20 mg tablets and tapered dose to 5 mg daily for 3 months the 2.5 mg for the final month. By [facility], he is currently on prednisone 5 mg daily.  Anesthesia team to evaluate on the day of surgery.    VS: BP (!) 148/73   Pulse 78   Temp 36.8 C (Oral)   Resp 17   Ht 5\' 10"  (1.778 m)   Wt 103.2 kg   SpO2 99%   BMI 32.66 kg/m    PROVIDERS: Michael Boston, MD is PCP Jenne Campus, MD is cardiologist Leigh Aurora, MD is rheumatologist Mora Appl, MD is neurologist ([facility]) Monica Martinez, MD is vascular surgeon. 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  Patient was completely asymptomatic and without signs of mesenteric ischemia.  He discussed concerning symptoms to monitor for, otherwise patient will follow as needed.    LABS: Labs reviewed: Acceptable for surgery. Cr 1.30, consistent with labs from 2022 in [facility]. (all labs ordered are listed, but only abnormal results are displayed)  Labs Reviewed  SURGICAL PCR SCREEN - Abnormal; Notable for the following components:      Result Value   Staphylococcus aureus POSITIVE (*)  All other components within normal limits  COMPREHENSIVE METABOLIC PANEL - Abnormal; Notable for the following components:   Glucose, Bld 110 (*)    Creatinine, Ser 1.30 (*)    GFR, Estimated 59 (*)    All other components within normal limits  CBC  TYPE AND SCREEN    Home Sleep Study 07/29/21: RECOMMENDATION:  This home sleep test demonstrates severe obstructive sleep apnea with  a total AHI of 35.3/hour and O2 nadir of 76%.  Snoring was detected, ranging primarily in the mild to moderate range.  Treatment with positive airway pressure is highly recommended. This will require - ideally - a full night CPAP titration study for proper treatment settings, O2 monitoring and mask fitting. For now, the patient will be advised to proceed with an autoPAP titration/trial at home.   IMAGES: MRI L-spine 04/14/21: IMPRESSION: Mild generalized degenerative changes without neural impingement to explain sciatica.    EKG: 06/06/22 ([facility]): Normal sinus rhythm Septal infarct, age undetermined Inferior infarct, age undetermined   CV: Echo (Limited) 03/23/20: IMPRESSIONS   1. Left ventricular ejection fraction, by estimation, is 60 to 65%. The  left ventricle has normal function. The left ventricle has no regional  wall motion abnormalities. There is mild concentric left ventricular  hypertrophy. Left ventricular diastolic  function could not be evaluated.   2. Right ventricular systolic function is normal. The right ventricular  size is normal.   3. The mitral valve is normal in structure. Trivial mitral valve  regurgitation. No evidence of mitral stenosis.   4. The aortic valve is tricuspid. Aortic valve regurgitation is not  visualized. No aortic stenosis is present.    Patent right and left carotid systems with minimal atherosclerotic irregularities of the ICA origins without measurable stenosis by 03/23/20 CTA neck.    Echo 02/14/20: IMPRESSIONS   1. Left ventricular ejection fraction, by estimation, is 60 to 65%. The  left ventricle has normal function. The left ventricle has no regional  wall motion abnormalities. There is mild concentric left ventricular  hypertrophy. Left ventricular diastolic  parameters are consistent with Grade I diastolic dysfunction (impaired  relaxation).   2. Right ventricular systolic function is normal. The right ventricular  size  is normal. There is normal pulmonary artery systolic pressure.   3. The mitral valve is normal in structure. Mild mitral valve  regurgitation. No evidence of mitral stenosis.   4. The aortic valve is tricuspid. Aortic valve regurgitation is not  visualized. No aortic stenosis is present.   5. The inferior vena cava is normal in size with greater than 50%  respiratory variability, suggesting right atrial pressure of 3 mmHg.    US Renal 02/14/20: Summary:  Largest Aortic Diameter: 2.8 cm  Renal:  - Right: Normal size right kidney. No evidence of right renal artery stenosis. Abnormal right Resistive Index.  - Left:  Normal size of left kidney. No evidence of left renal artery stenosis. Abnormal left Resisitve Index.  Mesenteric:  - Normal Celiac artery findings. 70 to 99% stenosis in the superior  mesenteric artery.    Past Medical History:  Diagnosis Date   Abdominal wall pain 04/02/2021   Acute ischemic stroke ([place]) 03/22/2020   Avulsed toenail, initial encounter 05/04/2018   Dyspnea on exertion 02/10/2020   ED (erectile dysfunction)    Epidermoid cyst of neck 10/16/2017   Essential hypertension 07/21/2017   GERD (gastroesophageal reflux disease)    Gout of multiple sites 07/21/2017   History of kidney stones  History of squamous cell carcinoma excision    Hyperlipidemia    Hypertension not at goal 01/19/2014   It band syndrome, left 03/19/2020   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/24/2020   Left ureteral calculus    Lower abdominal pain 08/27/2015   Metatarsalgia of both feet 03/19/2020   Multiple joint pain 07/16/2018   Pain in joint, shoulder region 12/11/2015   Right flank pain 04/02/2021   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   Urgency of urination    URI (upper respiratory infection) 11/08/2014   Wears glasses     Past Surgical History:  Procedure Laterality Date   COLONOSCOPY WITH PROPOFOL  2017    CYSTOSCOPY/RETROGRADE/URETEROSCOPY/STONE EXTRACTION WITH BASKET Left 04/16/2018   Procedure: CYSTOSCOPY/RETROGRADE/URETEROSCOPY/STONE EXTRACTION WITH BASKET/ HOLMIUM LASER LITHOTRIPSY/ STENT PLACEMENT;  Surgeon: Ardis Hughs, MD;  Location: [facility];  Service: Urology;  Laterality: Left;   HERNIA REPAIR Right 2022   inguinal   HOLMIUM LASER APPLICATION Left 17/49/4496   Procedure: HOLMIUM LASER APPLICATION;  Surgeon: Ardis Hughs, MD;  Location: [facility];  Service: Urology;  Laterality: Left;    MEDICATIONS:  allopurinol (ZYLOPRIM) 300 MG tablet   amLODipine (NORVASC) 5 MG tablet   gabapentin (NEURONTIN) 300 MG capsule   hydrochlorothiazide (HYDRODIURIL) 25 MG tablet   lidocaine (XYLOCAINE) 5 % ointment   lidocaine 4 %   minoxidil (LONITEN) 10 MG tablet   pantoprazole (PROTONIX) 40 MG tablet   predniSONE (DELTASONE) 5 MG tablet   PRESCRIPTION MEDICATION   traMADol (ULTRAM) 50 MG tablet   valsartan (DIOVAN) 320 MG tablet   No current facility-administered medications for this encounter.    Myra Gianotti, PA-C Surgical Short Stay/Anesthesiology [facility] Phone [phone] [facility] Phone [phone] 06/20/2022 5:01 PM

## 2022-06-22 ENCOUNTER — Other Ambulatory Visit: Payer: Self-pay | Admitting: Physician Assistant

## 2022-06-22 MED ORDER — ONDANSETRON HCL 4 MG PO TABS: 4.0000 mg | ORAL_TABLET | Freq: Three times a day (TID) | ORAL | 0 refills | Status: DC | PRN

## 2022-06-22 MED ORDER — OXYCODONE-ACETAMINOPHEN 5-325 MG PO TABS: 1.0000 | ORAL_TABLET | Freq: Four times a day (QID) | ORAL | 0 refills | Status: DC | PRN

## 2022-06-22 MED ORDER — METHOCARBAMOL 750 MG PO TABS: 750.0000 mg | ORAL_TABLET | Freq: Two times a day (BID) | ORAL | 2 refills | Status: DC | PRN

## 2022-06-22 MED ORDER — DOCUSATE SODIUM 100 MG PO CAPS: 100.0000 mg | ORAL_CAPSULE | Freq: Every day | ORAL | 2 refills | Status: DC | PRN

## 2022-06-22 MED ORDER — ASPIRIN 81 MG PO TBEC: 81.0000 mg | DELAYED_RELEASE_TABLET | Freq: Two times a day (BID) | ORAL | 2 refills | Status: DC

## 2022-06-23 ENCOUNTER — Encounter: Payer: Self-pay | Admitting: Orthopaedic Surgery

## 2022-06-24 NOTE — Telephone Encounter (Signed)
Kidney function is slightly decreased, but nothing that would interfere with having the surgery

## 2022-06-27 MED ORDER — TRANEXAMIC ACID 1000 MG/10ML IV SOLN
2000.0000 mg | INTRAVENOUS | Status: DC
  Filled 2022-06-27: qty 20

## 2022-06-30 ENCOUNTER — Encounter (HOSPITAL_COMMUNITY)
Admission: RE | Disposition: A | Discharge status: Home / Self Care | Payer: Self-pay | Source: Home / Self Care | Attending: Orthopaedic Surgery

## 2022-06-30 ENCOUNTER — Observation Stay (HOSPITAL_COMMUNITY): Payer: Medicare HMO

## 2022-06-30 ENCOUNTER — Ambulatory Visit (HOSPITAL_COMMUNITY): Payer: Medicare HMO | Admitting: Vascular Surgery

## 2022-06-30 ENCOUNTER — Ambulatory Visit (HOSPITAL_COMMUNITY): Payer: Medicare HMO

## 2022-06-30 ENCOUNTER — Other Ambulatory Visit: Payer: Self-pay

## 2022-06-30 ENCOUNTER — Observation Stay (HOSPITAL_COMMUNITY)
Admission: RE | Admit: 2022-06-30 | Discharge: 2022-07-01 | Disposition: A | Discharge status: Home / Self Care | Payer: Medicare HMO | Attending: Orthopaedic Surgery | Admitting: Orthopaedic Surgery

## 2022-06-30 ENCOUNTER — Ambulatory Visit (HOSPITAL_BASED_OUTPATIENT_CLINIC_OR_DEPARTMENT_OTHER): Payer: Medicare HMO | Admitting: General Practice

## 2022-06-30 ENCOUNTER — Encounter (HOSPITAL_COMMUNITY): Payer: Self-pay | Admitting: Orthopaedic Surgery

## 2022-06-30 DIAGNOSIS — Z8673 Personal history of transient ischemic attack (TIA), and cerebral infarction without residual deficits: Secondary | ICD-10-CM | POA: Insufficient documentation

## 2022-06-30 DIAGNOSIS — Z96641 Presence of right artificial hip joint: Secondary | ICD-10-CM

## 2022-06-30 DIAGNOSIS — M94251 Chondromalacia, right hip: Secondary | ICD-10-CM | POA: Diagnosis not present

## 2022-06-30 DIAGNOSIS — I739 Peripheral vascular disease, unspecified: Secondary | ICD-10-CM

## 2022-06-30 DIAGNOSIS — Z85828 Personal history of other malignant neoplasm of skin: Secondary | ICD-10-CM | POA: Insufficient documentation

## 2022-06-30 DIAGNOSIS — Z87891 Personal history of nicotine dependence: Secondary | ICD-10-CM | POA: Insufficient documentation

## 2022-06-30 DIAGNOSIS — Z79899 Other long term (current) drug therapy: Secondary | ICD-10-CM | POA: Insufficient documentation

## 2022-06-30 DIAGNOSIS — Z7982 Long term (current) use of aspirin: Secondary | ICD-10-CM | POA: Diagnosis not present

## 2022-06-30 DIAGNOSIS — M1611 Unilateral primary osteoarthritis, right hip: Secondary | ICD-10-CM | POA: Diagnosis not present

## 2022-06-30 DIAGNOSIS — I10 Essential (primary) hypertension: Secondary | ICD-10-CM

## 2022-06-30 DIAGNOSIS — Z471 Aftercare following joint replacement surgery: Secondary | ICD-10-CM | POA: Diagnosis not present

## 2022-06-30 HISTORY — PX: TOTAL HIP ARTHROPLASTY: SHX124

## 2022-06-30 LAB — ABO/RH: ABO/RH(D): O NEG

## 2022-06-30 SURGERY — ARTHROPLASTY, HIP, TOTAL, ANTERIOR APPROACH
Anesthesia: Spinal | Site: Hip | Laterality: Right

## 2022-06-30 MED ORDER — CEFAZOLIN SODIUM-DEXTROSE 2-4 GM/100ML-% IV SOLN
INTRAVENOUS | Status: AC
  Filled 2022-06-30: qty 100

## 2022-06-30 MED ORDER — ONDANSETRON HCL 4 MG PO TABS: 4.0000 mg | ORAL_TABLET | Freq: Four times a day (QID) | ORAL | Status: DC | PRN

## 2022-06-30 MED ORDER — MENTHOL 3 MG MT LOZG: 1.0000 | LOZENGE | OROMUCOSAL | Status: DC | PRN

## 2022-06-30 MED ORDER — OXYCODONE HCL 5 MG PO TABS
5.0000 mg | ORAL_TABLET | ORAL | Status: DC | PRN
  Administered 2022-06-30 – 2022-07-01 (×4): 10 mg via ORAL
  Filled 2022-06-30 (×4): qty 2

## 2022-06-30 MED ORDER — METHOCARBAMOL 500 MG PO TABS
500.0000 mg | ORAL_TABLET | Freq: Four times a day (QID) | ORAL | Status: DC | PRN
  Filled 2022-06-30: qty 1

## 2022-06-30 MED ORDER — HYDROMORPHONE HCL 1 MG/ML IJ SOLN
0.5000 mg | INTRAMUSCULAR | Status: DC | PRN
  Administered 2022-07-01: 1 mg via INTRAVENOUS
  Filled 2022-06-30: qty 1

## 2022-06-30 MED ORDER — POVIDONE-IODINE 10 % EX SWAB: 2.0000 | Freq: Once | CUTANEOUS | Status: DC

## 2022-06-30 MED ORDER — SORBITOL 70 % SOLN: 30.0000 mL | Freq: Every day | Status: DC | PRN

## 2022-06-30 MED ORDER — ACETAMINOPHEN 500 MG PO TABS: 1000.0000 mg | ORAL_TABLET | Freq: Once | ORAL | Status: DC

## 2022-06-30 MED ORDER — PANTOPRAZOLE SODIUM 40 MG PO TBEC
40.0000 mg | DELAYED_RELEASE_TABLET | Freq: Two times a day (BID) | ORAL | Status: DC
  Administered 2022-06-30: 40 mg via ORAL
  Filled 2022-06-30 (×2): qty 1

## 2022-06-30 MED ORDER — TRANEXAMIC ACID-NACL 1000-0.7 MG/100ML-% IV SOLN
1000.0000 mg | Freq: Once | INTRAVENOUS | Status: AC
  Administered 2022-06-30: 1000 mg via INTRAVENOUS
  Filled 2022-06-30: qty 100

## 2022-06-30 MED ORDER — LACTATED RINGERS IV SOLN: INTRAVENOUS | Status: DC

## 2022-06-30 MED ORDER — SODIUM CHLORIDE 0.9 % IR SOLN
Status: DC | PRN
  Administered 2022-06-30: 1000 mL

## 2022-06-30 MED ORDER — POLYETHYLENE GLYCOL 3350 17 G PO PACK
17.0000 g | PACK | Freq: Every day | ORAL | Status: DC
  Administered 2022-06-30 – 2022-07-01 (×2): 17 g via ORAL
  Filled 2022-06-30 (×2): qty 1

## 2022-06-30 MED ORDER — CEFAZOLIN SODIUM-DEXTROSE 2-4 GM/100ML-% IV SOLN
2.0000 g | INTRAVENOUS | Status: AC
  Administered 2022-06-30: 2 g via INTRAVENOUS

## 2022-06-30 MED ORDER — HYDROCHLOROTHIAZIDE 25 MG PO TABS
25.0000 mg | ORAL_TABLET | Freq: Every day | ORAL | Status: DC
  Filled 2022-06-30: qty 1

## 2022-06-30 MED ORDER — DOCUSATE SODIUM 100 MG PO CAPS
100.0000 mg | ORAL_CAPSULE | Freq: Two times a day (BID) | ORAL | Status: DC
  Administered 2022-06-30 – 2022-07-01 (×3): 100 mg via ORAL
  Filled 2022-06-30 (×3): qty 1

## 2022-06-30 MED ORDER — MIDAZOLAM HCL 2 MG/2ML IJ SOLN
INTRAMUSCULAR | Status: AC
  Filled 2022-06-30: qty 2

## 2022-06-30 MED ORDER — ACETAMINOPHEN 500 MG PO TABS
ORAL_TABLET | ORAL | Status: AC
  Filled 2022-06-30: qty 2

## 2022-06-30 MED ORDER — PHENYLEPHRINE HCL-NACL 20-0.9 MG/250ML-% IV SOLN
INTRAVENOUS | Status: DC | PRN
  Administered 2022-06-30: 25 ug/min via INTRAVENOUS

## 2022-06-30 MED ORDER — CEFAZOLIN SODIUM-DEXTROSE 2-4 GM/100ML-% IV SOLN
2.0000 g | Freq: Four times a day (QID) | INTRAVENOUS | Status: AC
  Administered 2022-06-30 (×2): 2 g via INTRAVENOUS
  Filled 2022-06-30 (×2): qty 100

## 2022-06-30 MED ORDER — ONDANSETRON HCL 4 MG/2ML IJ SOLN
INTRAMUSCULAR | Status: DC | PRN
  Administered 2022-06-30: 4 mg via INTRAVENOUS

## 2022-06-30 MED ORDER — ASPIRIN 81 MG PO CHEW
81.0000 mg | CHEWABLE_TABLET | Freq: Two times a day (BID) | ORAL | Status: DC
  Administered 2022-06-30 – 2022-07-01 (×2): 81 mg via ORAL
  Filled 2022-06-30 (×2): qty 1

## 2022-06-30 MED ORDER — ACETAMINOPHEN 500 MG PO TABS
1000.0000 mg | ORAL_TABLET | Freq: Four times a day (QID) | ORAL | Status: AC
  Administered 2022-06-30 – 2022-07-01 (×3): 1000 mg via ORAL
  Filled 2022-06-30 (×4): qty 2

## 2022-06-30 MED ORDER — DIPHENHYDRAMINE HCL 12.5 MG/5ML PO ELIX: 25.0000 mg | ORAL_SOLUTION | ORAL | Status: DC | PRN

## 2022-06-30 MED ORDER — PHENOL 1.4 % MT LIQD: 1.0000 | OROMUCOSAL | Status: DC | PRN

## 2022-06-30 MED ORDER — EPHEDRINE 5 MG/ML INJ
INTRAVENOUS | Status: AC
  Filled 2022-06-30: qty 5

## 2022-06-30 MED ORDER — OXYCODONE HCL 5 MG PO TABS: 10.0000 mg | ORAL_TABLET | ORAL | Status: DC | PRN

## 2022-06-30 MED ORDER — BUPIVACAINE IN DEXTROSE 0.75-8.25 % IT SOLN
INTRATHECAL | Status: DC | PRN
  Administered 2022-06-30: 2 mL via INTRATHECAL

## 2022-06-30 MED ORDER — FERROUS SULFATE 325 (65 FE) MG PO TABS
325.0000 mg | ORAL_TABLET | Freq: Three times a day (TID) | ORAL | Status: DC
  Administered 2022-06-30 – 2022-07-01 (×3): 325 mg via ORAL
  Filled 2022-06-30 (×3): qty 1

## 2022-06-30 MED ORDER — MAGNESIUM CITRATE PO SOLN: 1.0000 | Freq: Once | ORAL | Status: DC | PRN

## 2022-06-30 MED ORDER — BUPIVACAINE-MELOXICAM ER 400-12 MG/14ML IJ SOLN
INTRAMUSCULAR | Status: AC
  Filled 2022-06-30: qty 1

## 2022-06-30 MED ORDER — 0.9 % SODIUM CHLORIDE (POUR BTL) OPTIME
TOPICAL | Status: DC | PRN
  Administered 2022-06-30: 1000 mL

## 2022-06-30 MED ORDER — ALUM & MAG HYDROXIDE-SIMETH 200-200-20 MG/5ML PO SUSP: 30.0000 mL | ORAL | Status: DC | PRN

## 2022-06-30 MED ORDER — TRANEXAMIC ACID-NACL 1000-0.7 MG/100ML-% IV SOLN
INTRAVENOUS | Status: AC
  Filled 2022-06-30: qty 100

## 2022-06-30 MED ORDER — FENTANYL CITRATE (PF) 100 MCG/2ML IJ SOLN
25.0000 ug | INTRAMUSCULAR | Status: DC | PRN
  Administered 2022-06-30 (×2): 50 ug via INTRAVENOUS

## 2022-06-30 MED ORDER — PANTOPRAZOLE SODIUM 40 MG PO TBEC
40.0000 mg | DELAYED_RELEASE_TABLET | Freq: Every day | ORAL | Status: DC
Start: 2022-06-30 — End: 2022-06-30

## 2022-06-30 MED ORDER — IRBESARTAN 150 MG PO TABS
300.0000 mg | ORAL_TABLET | Freq: Every day | ORAL | Status: DC
  Filled 2022-06-30: qty 2

## 2022-06-30 MED ORDER — METOCLOPRAMIDE HCL 5 MG PO TABS: 5.0000 mg | ORAL_TABLET | Freq: Three times a day (TID) | ORAL | Status: DC | PRN

## 2022-06-30 MED ORDER — AMLODIPINE BESYLATE 5 MG PO TABS
5.0000 mg | ORAL_TABLET | Freq: Every day | ORAL | Status: DC
  Filled 2022-06-30: qty 1

## 2022-06-30 MED ORDER — ONDANSETRON HCL 4 MG/2ML IJ SOLN: 4.0000 mg | Freq: Four times a day (QID) | INTRAMUSCULAR | Status: DC | PRN

## 2022-06-30 MED ORDER — METHOCARBAMOL 1000 MG/10ML IJ SOLN: 500.0000 mg | Freq: Four times a day (QID) | INTRAVENOUS | Status: DC | PRN

## 2022-06-30 MED ORDER — ACETAMINOPHEN 325 MG PO TABS
325.0000 mg | ORAL_TABLET | Freq: Four times a day (QID) | ORAL | Status: DC | PRN
  Filled 2022-06-30: qty 2

## 2022-06-30 MED ORDER — PROPOFOL 10 MG/ML IV BOLUS
INTRAVENOUS | Status: DC | PRN
  Administered 2022-06-30: 10 mg via INTRAVENOUS

## 2022-06-30 MED ORDER — VANCOMYCIN HCL 1000 MG IV SOLR
INTRAVENOUS | Status: AC
  Filled 2022-06-30: qty 20

## 2022-06-30 MED ORDER — TRANEXAMIC ACID-NACL 1000-0.7 MG/100ML-% IV SOLN
1000.0000 mg | INTRAVENOUS | Status: AC
  Administered 2022-06-30: 1000 mg via INTRAVENOUS

## 2022-06-30 MED ORDER — ONDANSETRON HCL 4 MG/2ML IJ SOLN
INTRAMUSCULAR | Status: AC
  Filled 2022-06-30: qty 2

## 2022-06-30 MED ORDER — VANCOMYCIN HCL 1 G IV SOLR
INTRAVENOUS | Status: DC | PRN
  Administered 2022-06-30: 1000 mg

## 2022-06-30 MED ORDER — TRANEXAMIC ACID 1000 MG/10ML IV SOLN
INTRAVENOUS | Status: DC | PRN
  Administered 2022-06-30: 2000 mg via TOPICAL

## 2022-06-30 MED ORDER — METOCLOPRAMIDE HCL 5 MG/ML IJ SOLN: 5.0000 mg | Freq: Three times a day (TID) | INTRAMUSCULAR | Status: DC | PRN

## 2022-06-30 MED ORDER — BUPIVACAINE-MELOXICAM ER 400-12 MG/14ML IJ SOLN
INTRAMUSCULAR | Status: DC | PRN
  Administered 2022-06-30: 400 mg

## 2022-06-30 MED ORDER — AMISULPRIDE (ANTIEMETIC) 5 MG/2ML IV SOLN: 10.0000 mg | Freq: Once | INTRAVENOUS | Status: DC | PRN

## 2022-06-30 MED ORDER — MIDAZOLAM HCL 2 MG/2ML IJ SOLN
INTRAMUSCULAR | Status: DC | PRN
  Administered 2022-06-30: 2 mg via INTRAVENOUS

## 2022-06-30 MED ORDER — PROPOFOL 500 MG/50ML IV EMUL
INTRAVENOUS | Status: DC | PRN
  Administered 2022-06-30: 50 ug/kg/min via INTRAVENOUS

## 2022-06-30 MED ORDER — DEXAMETHASONE SODIUM PHOSPHATE 10 MG/ML IJ SOLN
10.0000 mg | Freq: Once | INTRAMUSCULAR | Status: AC
  Administered 2022-07-01: 10 mg via INTRAVENOUS
  Filled 2022-06-30: qty 1

## 2022-06-30 MED ORDER — CHLORHEXIDINE GLUCONATE 0.12 % MT SOLN
OROMUCOSAL | Status: AC
  Administered 2022-06-30: 15 mL via OROMUCOSAL
  Filled 2022-06-30: qty 15

## 2022-06-30 MED ORDER — OXYCODONE HCL ER 10 MG PO T12A
10.0000 mg | EXTENDED_RELEASE_TABLET | Freq: Two times a day (BID) | ORAL | Status: DC
  Administered 2022-06-30 – 2022-07-01 (×2): 10 mg via ORAL
  Filled 2022-06-30 (×3): qty 1

## 2022-06-30 MED ORDER — GABAPENTIN 300 MG PO CAPS
900.0000 mg | ORAL_CAPSULE | Freq: Every day | ORAL | Status: DC
  Administered 2022-06-30: 900 mg via ORAL
  Filled 2022-06-30: qty 3

## 2022-06-30 MED ORDER — SODIUM CHLORIDE 0.9 % IV SOLN: INTRAVENOUS | Status: DC

## 2022-06-30 MED ORDER — ORAL CARE MOUTH RINSE: 15.0000 mL | Freq: Once | OROMUCOSAL | Status: AC

## 2022-06-30 MED ORDER — FENTANYL CITRATE (PF) 100 MCG/2ML IJ SOLN
INTRAMUSCULAR | Status: AC
  Filled 2022-06-30: qty 2

## 2022-06-30 MED ORDER — PROPOFOL 10 MG/ML IV BOLUS
INTRAVENOUS | Status: AC
  Filled 2022-06-30: qty 20

## 2022-06-30 MED ORDER — CHLORHEXIDINE GLUCONATE 0.12 % MT SOLN: 15.0000 mL | Freq: Once | OROMUCOSAL | Status: AC

## 2022-06-30 MED ORDER — EPHEDRINE SULFATE-NACL 50-0.9 MG/10ML-% IV SOSY
PREFILLED_SYRINGE | INTRAVENOUS | Status: DC | PRN
  Administered 2022-06-30 (×4): 5 mg via INTRAVENOUS

## 2022-06-30 SURGICAL SUPPLY — 69 items
ACETAB CUP W/GRIPTION 54 (Plate) ×2 IMPLANT
BAG COUNTER SPONGE SURGICOUNT (BAG) ×2 IMPLANT
BAG DECANTER FOR FLEXI CONT (MISCELLANEOUS) ×2 IMPLANT
CELLS DAT CNTRL 66122 CELL SVR (MISCELLANEOUS) IMPLANT
CLSR STERI-STRIP ANTIMIC 1/2X4 (GAUZE/BANDAGES/DRESSINGS) ×2 IMPLANT
COVER PERINEAL POST (MISCELLANEOUS) ×2 IMPLANT
COVER SURGICAL LIGHT HANDLE (MISCELLANEOUS) ×2 IMPLANT
CUP ACETAB W/GRIPTION 54 (Plate) IMPLANT
DRAPE C-ARM 42X72 X-RAY (DRAPES) ×2 IMPLANT
DRAPE POUCH INSTRU U-SHP 10X18 (DRAPES) ×2 IMPLANT
DRAPE STERI IOBAN 125X83 (DRAPES) ×2 IMPLANT
DRAPE U-SHAPE 47X51 STRL (DRAPES) ×4 IMPLANT
DRSG AQUACEL AG ADV 3.5X10 (GAUZE/BANDAGES/DRESSINGS) ×2 IMPLANT
DURAPREP 26ML APPLICATOR (WOUND CARE) ×4 IMPLANT
ELECT BLADE 4.0 EZ CLEAN MEGAD (MISCELLANEOUS) ×2
ELECT REM PT RETURN 9FT ADLT (ELECTROSURGICAL) ×2
ELECTRODE BLDE 4.0 EZ CLN MEGD (MISCELLANEOUS) ×1 IMPLANT
ELECTRODE REM PT RTRN 9FT ADLT (ELECTROSURGICAL) ×1 IMPLANT
GLOVE BIOGEL PI IND STRL 7.0 (GLOVE) ×2 IMPLANT
GLOVE BIOGEL PI IND STRL 7.5 (GLOVE) ×5 IMPLANT
GLOVE BIOGEL PI INDICATOR 7.0 (GLOVE) ×2
GLOVE BIOGEL PI INDICATOR 7.5 (GLOVE) ×5
GLOVE ECLIPSE 7.0 STRL STRAW (GLOVE) ×4 IMPLANT
GLOVE SKINSENSE NS SZ7.5 (GLOVE) ×1
GLOVE SKINSENSE STRL SZ7.5 (GLOVE) ×1 IMPLANT
GLOVE SURG SYN 7.5  E (GLOVE) ×4
GLOVE SURG SYN 7.5 E (GLOVE) ×2 IMPLANT
GLOVE SURG SYN 7.5 PF PI (GLOVE) ×2 IMPLANT
GLOVE SURG UNDER POLY LF SZ7 (GLOVE) ×2 IMPLANT
GLOVE SURG UNDER POLY LF SZ7.5 (GLOVE) ×4 IMPLANT
GOWN STRL REIN XL XLG (GOWN DISPOSABLE) ×2 IMPLANT
GOWN STRL REUS W/ TWL LRG LVL3 (GOWN DISPOSABLE) IMPLANT
GOWN STRL REUS W/ TWL XL LVL3 (GOWN DISPOSABLE) ×1 IMPLANT
GOWN STRL REUS W/TWL LRG LVL3 (GOWN DISPOSABLE)
GOWN STRL REUS W/TWL XL LVL3 (GOWN DISPOSABLE) ×2
HANDPIECE INTERPULSE COAX TIP (DISPOSABLE) ×2
HEAD CERAMIC 36 PLUS5 (Hips) ×1 IMPLANT
HOOD PEEL AWAY FLYTE STAYCOOL (MISCELLANEOUS) ×4 IMPLANT
IV NS IRRIG 3000ML ARTHROMATIC (IV SOLUTION) ×2 IMPLANT
JET LAVAGE IRRISEPT WOUND (IRRIGATION / IRRIGATOR) ×2
KIT BASIN OR (CUSTOM PROCEDURE TRAY) ×2 IMPLANT
LAVAGE JET IRRISEPT WOUND (IRRIGATION / IRRIGATOR) ×1 IMPLANT
LINER NEUTRAL 54X36MM PLUS 4 (Hips) ×1 IMPLANT
MARKER SKIN DUAL TIP RULER LAB (MISCELLANEOUS) ×2 IMPLANT
NDL SPNL 18GX3.5 QUINCKE PK (NEEDLE) ×1 IMPLANT
NEEDLE SPNL 18GX3.5 QUINCKE PK (NEEDLE) ×2 IMPLANT
PACK TOTAL JOINT (CUSTOM PROCEDURE TRAY) ×2 IMPLANT
PACK UNIVERSAL I (CUSTOM PROCEDURE TRAY) ×2 IMPLANT
RETRACTOR WND ALEXIS 18 MED (MISCELLANEOUS) IMPLANT
RTRCTR WOUND ALEXIS 18CM MED (MISCELLANEOUS)
SAW OSC TIP CART 19.5X105X1.3 (SAW) ×2 IMPLANT
SCREW 6.5MMX30MM (Screw) ×1 IMPLANT
SET HNDPC FAN SPRY TIP SCT (DISPOSABLE) ×1 IMPLANT
STAPLER VISISTAT 35W (STAPLE) IMPLANT
STEM FEMORAL SZ5 HIGH ACTIS (Stem) ×1 IMPLANT
SUT ETHIBOND 2 V 37 (SUTURE) ×2 IMPLANT
SUT MNCRL AB 3-0 PS2 27 (SUTURE) ×1 IMPLANT
SUT VIC AB 0 CT1 27 (SUTURE) ×2
SUT VIC AB 0 CT1 27XBRD ANBCTR (SUTURE) ×1 IMPLANT
SUT VIC AB 1 CTX 36 (SUTURE) ×2
SUT VIC AB 1 CTX36XBRD ANBCTR (SUTURE) ×1 IMPLANT
SUT VIC AB 2-0 CT1 27 (SUTURE) ×4
SUT VIC AB 2-0 CT1 TAPERPNT 27 (SUTURE) ×2 IMPLANT
SYR 50ML LL SCALE MARK (SYRINGE) ×2 IMPLANT
TOWEL GREEN STERILE (TOWEL DISPOSABLE) ×2 IMPLANT
TRAY CATH 16FR W/PLASTIC CATH (SET/KITS/TRAYS/PACK) IMPLANT
TRAY FOLEY W/BAG SLVR 16FR (SET/KITS/TRAYS/PACK) ×2
TRAY FOLEY W/BAG SLVR 16FR ST (SET/KITS/TRAYS/PACK) ×1 IMPLANT
YANKAUER SUCT BULB TIP NO VENT (SUCTIONS) ×2 IMPLANT

## 2022-06-30 NOTE — Anesthesia Procedure Notes (Signed)
Spinal  Patient location during procedure: OR Start time: 06/30/2022 7:23 AM End time: 06/30/2022 7:28 AM Reason for block: surgical anesthesia Staffing Performed: anesthesiologist  Anesthesiologist: Suzette Battiest, MD Performed by: Suzette Battiest, MD Authorized by: Suzette Battiest, MD   Preanesthetic Checklist Completed: patient identified, IV checked, site marked, risks and benefits discussed, surgical consent, monitors and equipment checked, pre-op evaluation and timeout performed Spinal Block Patient position: sitting Prep: DuraPrep Patient monitoring: heart rate, cardiac monitor, continuous pulse ox and blood pressure Approach: midline Location: L4-5 Injection technique: single-shot Needle Needle type: Pencan  Needle gauge: 24 G Needle length: 9 cm Assessment Sensory level: T4 Events: CSF return

## 2022-06-30 NOTE — Anesthesia Postprocedure Evaluation (Signed)
Anesthesia Post Note  Patient: Thomas [name]  Procedure(s) Performed: RIGHT TOTAL HIP ARTHROPLASTY ANTERIOR APPROACH (Right: Hip)     Patient location during evaluation: PACU Anesthesia Type: Spinal Level of consciousness: awake and alert Pain management: pain level controlled Vital Signs Assessment: post-procedure vital signs reviewed and stable Respiratory status: spontaneous breathing and respiratory function stable Cardiovascular status: blood pressure returned to baseline and stable Postop Assessment: spinal receding Anesthetic complications: no   No notable events documented.  Last Vitals:  Vitals:   06/30/22 1045 06/30/22 1104  BP: (!) 144/67 (!) 169/72  Pulse: 65 (!) 57  Resp: 12 18  Temp: (!) 36.4 C   SpO2: 94% 99%    Last Pain:  Vitals:   06/30/22 1104  TempSrc:   PainSc: 3                  Tiajuana Amass

## 2022-06-30 NOTE — Evaluation (Signed)
Physical Therapy Evaluation Patient Details Name: Thomas [name] MRN: [id number] DOB: August 15, 1951 Today's Date: 06/30/2022  History of Present Illness  The pt is a 71 yo male presenting 8/14 for R THA due to chronic R hip pain.  PMH includes: CVA in 2021, HTN, and HLD.   Clinical Impression  Pt in bed upon arrival of PT, agreeable to evaluation at this time. Prior to admission the pt was independent with mobility, but progressively limited by pain recently. The pt now presents with limitations in functional mobility, power, strength, endurance, and dynamic stability due to above dx, and will continue to benefit from skilled PT to address these deficits. The pt benefits from intermittent cues to slow mobility for safety, but was able to complete bed mobility without assist, and then sit-stand transfers and hallway ambulation with minG and use of RW. The pt is dependent on BUE support at this time for stability and pain management, and needs minG and RW to manage stairs to enter his home. Will continue to benefit from mobility and skilled PT acutely but will be safe to d/c home with family when medically stable.   Pt given hip exercise handout and was instructed in technique and frequency, reports no further questions at this time.         Recommendations for follow up therapy are one component of a multi-disciplinary discharge planning process, led by the attending physician.  Recommendations may be updated based on patient status, additional functional criteria and insurance authorization.  Follow Up Recommendations Follow physician's recommendations for discharge plan and follow up therapies      Assistance Recommended at Discharge Intermittent Supervision/Assistance  Patient can return home with the following  A little help with walking and/or transfers;A little help with bathing/dressing/bathroom;Assistance with cooking/housework;Assistance with feeding;Direct supervision/assist for  medications management;Direct supervision/assist for financial management;Assist for transportation;Help with stairs or ramp for entrance    Equipment Recommendations Rolling walker (2 wheels)  Recommendations for Other Services       Functional Status Assessment Patient has had a recent decline in their functional status and demonstrates the ability to make significant improvements in function in a reasonable and predictable amount of time.     Precautions / Restrictions Precautions Precautions: Fall Restrictions Weight Bearing Restrictions: Yes RLE Weight Bearing: Weight bearing as tolerated      Mobility  Bed Mobility Overal bed mobility: Independent             General bed mobility comments: no assist, no increased time    Transfers Overall transfer level: Needs assistance Equipment used: Rolling walker (2 wheels) Transfers: Sit to/from Stand Sit to Stand: Min guard           General transfer comment: cues to slow for safety, hand placement    Ambulation/Gait Ambulation/Gait assistance: Min guard Gait Distance (Feet): 200 Feet Assistive device: Rolling walker (2 wheels), None Gait Pattern/deviations: Step-through pattern, Decreased stance time - right, Decreased weight shift to right, Decreased dorsiflexion - right, Antalgic Gait velocity: decreased Gait velocity interpretation: <1.31 ft/sec, indicative of household ambulator   General Gait Details: pt with increased antalgic gait without DME, no overt LOB  Stairs Stairs: Yes Stairs assistance: Min guard, Min assist Stair Management: One rail Left, Step to pattern, Forwards, Backwards, With walker Number of Stairs: 6 General stair comments: x4 with LLE leading and L rail with HHA in R, dependent on minA. Then educated in backwards with RW and pt able to complete with minG  Balance Overall balance assessment: Mild deficits observed, not formally tested                                            Pertinent Vitals/Pain Pain Assessment Pain Assessment: No/denies pain    Home Living Family/patient expects to be discharged to:: Private residence Living Arrangements: Spouse/significant other Available Help at Discharge: Family;Available 24 hours/day Type of Home: House Home Access: Stairs to enter Entrance Stairs-Rails: None Entrance Stairs-Number of Steps: 4   Home Layout: One level Home Equipment: Shower seat;Grab bars - toilet;Cane - single point      Prior Function Prior Level of Function : Independent/Modified Independent;Driving             Mobility Comments: pt independent other than recent use of cane for pain. ADLs Comments: independent     Hand Dominance        Extremity/Trunk Assessment   Upper Extremity Assessment Upper Extremity Assessment: Overall WFL for tasks assessed    Lower Extremity Assessment Lower Extremity Assessment: RLE deficits/detail RLE Deficits / Details: mild limitations due to pain, limited hip flexion against gravity. RLE: Unable to fully assess due to pain RLE Sensation: WNL (pt reports normal) RLE Coordination: WNL    Cervical / Trunk Assessment Cervical / Trunk Assessment: Normal  Communication   Communication: No difficulties  Cognition Arousal/Alertness: Awake/alert Behavior During Therapy: Impulsive Overall Cognitive Status: Within Functional Limits for tasks assessed                                 General Comments: slightly impaired awareness, pt impulsive and needing increased cues for safety. generally oriented and able to follow instructions        General Comments General comments (skin integrity, edema, etc.): VSS on RA.    Exercises Total Joint Exercises Ankle Circles/Pumps: AROM, Right, 10 reps, Seated Quad Sets: AROM, Right, 5 reps, Seated Heel Slides: AROM, Right, 5 reps, Seated Long Arc Quad: AROM, Right, 10 reps, Seated   Assessment/Plan    PT Assessment Patient  needs continued PT services  PT Problem List Decreased strength;Decreased activity tolerance;Decreased balance;Decreased mobility;Decreased safety awareness       PT Treatment Interventions DME instruction;Gait training;Stair training;Functional mobility training;Therapeutic activities;Therapeutic exercise;Balance training;Patient/family education    PT Goals (Current goals can be found in the Care Plan section)  Acute Rehab PT Goals Patient Stated Goal: return home, get back to walking outside PT Goal Formulation: With patient Time For Goal Achievement: 07/14/22 Potential to Achieve Goals: Good    Frequency 7X/week        AM-PAC PT "6 Clicks" Mobility  Outcome Measure Help needed turning from your back to your side while in a flat bed without using bedrails?: None Help needed moving from lying on your back to sitting on the side of a flat bed without using bedrails?: A Little Help needed moving to and from a bed to a chair (including a wheelchair)?: A Little Help needed standing up from a chair using your arms (e.g., wheelchair or bedside chair)?: A Little Help needed to walk in hospital room?: A Little Help needed climbing 3-5 steps with a railing? : A Little 6 Click Score: 19    End of Session Equipment Utilized During Treatment: Gait belt Activity Tolerance: Patient tolerated treatment well Patient left: in chair;with  call bell/phone within reach Nurse Communication: Mobility status PT Visit Diagnosis: Other abnormalities of gait and mobility (R26.89);Muscle weakness (generalized) (M62.81)    Time: 6226-3335 PT Time Calculation (min) (ACUTE ONLY): 35 min   Charges:   PT Evaluation $PT Eval Low Complexity: 1 Low PT Treatments $Gait Training: 8-22 mins        West Carbo, PT, DPT   Acute Rehabilitation Department  Sandra Cockayne 06/30/2022, 2:10 PM

## 2022-06-30 NOTE — Transfer of Care (Signed)
Immediate Anesthesia Transfer of Care Note  Patient: Thomas [name]  Procedure(s) Performed: RIGHT TOTAL HIP ARTHROPLASTY ANTERIOR APPROACH (Right: Hip)  Patient Location: PACU  Anesthesia Type:MAC and Spinal  Level of Consciousness: awake and alert   Airway & Oxygen Therapy: Patient Spontanous Breathing  Post-op Assessment: Report given to RN and Post -op Vital signs reviewed and stable  Post vital signs: Reviewed and stable  Last Vitals:  Vitals Value Taken Time  BP 118/62 06/30/22 0908  Temp    Pulse 81 06/30/22 0910  Resp 11 06/30/22 0910  SpO2 94 % 06/30/22 0910  Vitals shown include unvalidated device data.  Last Pain:  Vitals:   06/30/22 0551  TempSrc:   PainSc: 0-No pain         Complications: No notable events documented.

## 2022-06-30 NOTE — Anesthesia Procedure Notes (Signed)
Procedure Name: MAC Date/Time: 06/30/2022 7:18 AM  Performed by: Dorann Lodge, CRNAPre-anesthesia Checklist: Patient identified, Emergency Drugs available, Suction available and Patient being monitored Patient Re-evaluated:Patient Re-evaluated prior to induction Oxygen Delivery Method: Simple face mask Dental Injury: Teeth and Oropharynx as per pre-operative assessment

## 2022-06-30 NOTE — Progress Notes (Signed)
Pt has Nasal mask from home but it has a special adapter that will only fit with his home circuit. RT offered pt a hospital nasal mask but pt stated he would do without it for tonight.

## 2022-06-30 NOTE — Op Note (Signed)
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 Total hip replacement; Right hip; uncemented [id number]   Surgeon: Frankey Shown, M.D.  Assist: Madalyn Rob, PA-C   Anesthesia: spinal  Prosthesis: Depuy Acetabulum: Pinnacle 54 mm Femur: Actis 5 HO Head: 36 mm size: +5 Liner: +4 Bearing Type: ceramic/poly  Total Hip Arthroplasty (Anterior Approach) Op Note:  After informed consent was obtained and the operative extremity marked in the holding area, the patient was brought back to the operating room and placed supine on the HANA table. Next, the operative extremity was prepped and draped in normal sterile fashion. Surgical timeout occurred verifying patient identification, surgical site, surgical procedure and administration of antibiotics.  A modified anterior Smith-Peterson approach to the hip was performed, using the interval between tensor fascia lata and sartorius.  Dissection was carried bluntly down onto the anterior hip capsule. The lateral femoral circumflex vessels were identified and coagulated. A capsulotomy was performed and the capsular flaps tagged for later repair.  The neck osteotomy was performed. The femoral head was removed, the acetabular rim was cleared of soft tissue and attention was turned to reaming the acetabulum.  Sequential reaming was performed under fluoroscopic guidance. We reamed to a size 53 mm, and then impacted the acetabular shell. A 30 mm cancellous screw was placed through the shell for added fixation.  The liner was then placed after irrigation and attention turned to the femur.  After placing the femoral hook, the leg was taken to externally rotated, extended and adducted  position taking care to perform soft tissue releases to allow for adequate mobilization of the femur. Soft tissue was cleared from the shoulder of the greater trochanter and the hook elevator used to improve exposure of the proximal femur. Sequential broaching performed up to a size 5. Trial neck and head were placed. The leg was brought back up to neutral and the construct reduced.  Antibiotic irrigation was placed in the surgical wound.  The position and sizing of components, offset and leg lengths were checked using fluoroscopy. Stability of the construct was checked in extension and external rotation without any subluxation or impingement of prosthesis. We dislocated the prosthesis, dropped the leg back into position, removed trial components, and irrigated copiously. The final stem and head was then placed, the leg brought back up, the system reduced and fluoroscopy used to verify positioning.  We irrigated, obtained hemostasis and closed the capsule using #2 ethibond suture.  One gram of vancomycin powder was placed in the surgical bed.   One gram of topical tranexamic acid was injected into the joint.  The fascia was closed with #1 vicryl plus, the deep fat layer was closed with 0 vicryl, the subcutaneous layers closed with 2.0 Vicryl Plus and the skin closed with 3.0 monocryl and steri strips. A sterile dressing was applied. The patient was awakened in the operating room and taken to recovery in stable condition.  All sponge, needle, and instrument counts were correct at the end of the case.   Thomas [name], my PA, was a medical necessity for opening, closing, limb positioning, retracting, exposing, and overall facilitation and timely completion of the surgery.  Position: supine  Complications: see description of procedure.  Time Out: performed  Drains/Packing: none  Estimated blood loss: see anesthesia record  Returned to Recovery Room: in good condition.   Antibiotics: yes    Mechanical VTE (DVT) Prophylaxis: sequential compression devices, TED thigh-high  Chemical VTE (DVT) Prophylaxis: aspirin   Fluid Replacement: see anesthesia record  Specimens Removed: 1 to pathology   Sponge and Instrument Count Correct? yes   PACU: portable radiograph - low AP   Plan/RTC: Return in 2 weeks for staple removal. Weight Bearing/Load Lower Extremity: full  Hip precautions: none Suture Removal: 2 weeks   N. Eduard Roux, MD Marga Hoots 8:38 AM   Implant Name Type Inv. Item Serial No. Manufacturer Lot No. LRB No. Used Action  ACETAB CUP W/GRIPTION 54 - [id number] Plate ACETAB CUP W/GRIPTION 54  [employer] [id number] Right 1 Implanted  SCREW 6.5MMX30MM - [id number] Screw SCREW 6.5MMX30MM  [employer] [id number] Right 1 Implanted  LINER NEUTRAL 54X36MM PLUS 4 - [id number] Hips LINER NEUTRAL 54X36MM PLUS 4  [employer] [id number] Right 1 Implanted  HEAD CERAMIC 36 PLUS5 - [id number] Hips HEAD CERAMIC 36 PLUS5  [employer] [id number] Right 1 Implanted  STEM FEMORAL SZ5 HIGH ACTIS - [id number] Stem STEM FEMORAL SZ5 HIGH ACTIS  [employer] [id number] Right 1 Implanted

## 2022-06-30 NOTE — H&P (Signed)
PREOPERATIVE H&P  Chief Complaint: right hip degenerative joint disease  HPI: Thomas [name] is a 71 y.o. male who presents for surgical treatment of right hip degenerative joint disease.  He denies any changes in medical history.  Past Medical History:  Diagnosis Date   Abdominal wall pain 04/02/2021   Acute ischemic stroke ([place]) 03/22/2020   Avulsed toenail, initial encounter 05/04/2018   Dyspnea on exertion 02/10/2020   ED (erectile dysfunction)    Epidermoid cyst of neck 10/16/2017   Essential hypertension 07/21/2017   GERD (gastroesophageal reflux disease)    Gout of multiple sites 07/21/2017   History of kidney stones    History of squamous cell carcinoma excision    Hyperlipidemia    Hypertension not at goal 01/19/2014   It band syndrome, left 03/19/2020   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/24/2020   Left ureteral calculus    Lower abdominal pain 08/27/2015   Metatarsalgia of both feet 03/19/2020   Multiple joint pain 07/16/2018   Pain in joint, shoulder region 12/11/2015   Right flank pain 04/02/2021   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   Urgency of urination    URI (upper respiratory infection) 11/08/2014   Wears glasses    Past Surgical History:  Procedure Laterality Date   COLONOSCOPY WITH PROPOFOL  2017   CYSTOSCOPY/RETROGRADE/URETEROSCOPY/STONE EXTRACTION WITH BASKET Left 04/16/2018   Procedure: CYSTOSCOPY/RETROGRADE/URETEROSCOPY/STONE EXTRACTION WITH BASKET/ HOLMIUM LASER LITHOTRIPSY/ STENT PLACEMENT;  Surgeon: Ardis Hughs, MD;  Location: [facility];  Service: Urology;  Laterality: Left;   HERNIA REPAIR Right 2022   inguinal   HOLMIUM LASER APPLICATION Left 38/18/2993   Procedure: HOLMIUM LASER APPLICATION;  Surgeon: Ardis Hughs, MD;  Location: [facility];  Service: Urology;  Laterality: Left;   Social History   Socioeconomic History   Marital status: Married    Spouse  name: [name]   Number of children: 2   Years of education: Not on file   Highest education level: Not on file  Occupational History   Occupation: Pharmacist, hospital  Tobacco Use   Smoking status: Former    Packs/day: 0.30    Years: 20.00    Total pack years: 6.00    Types: Cigarettes    Quit date: 10/17/1994    Years since quitting: 27.7   Smokeless tobacco: Never  Vaping Use   Vaping Use: Not on file  Substance and Sexual Activity   Alcohol use: Not Currently   Drug use: No   Sexual activity: Yes  Other Topics Concern   Not on file  Social History Narrative   Exercise 1 hour a day ---3-4 days a week   Social Determinants of Health   Financial Resource Strain: Not on file  Food Insecurity: Not on file  Transportation Needs: Not on file  Physical Activity: Not on file  Stress: Not on file  Social Connections: Not on file   Family History  Problem Relation Age of Onset   Heart disease Mother 31   Alcohol abuse Mother    Depression Mother    Hypertension Father    Sleep apnea Father    Cancer Sister        hodgkins, breast   Colon cancer Neg Hx    Pancreatic cancer Neg Hx    Rectal cancer Neg Hx    Stomach cancer Neg Hx    Esophageal cancer Neg Hx    Inflammatory bowel disease Neg Hx    Liver  disease Neg Hx    Allergies  Allergen Reactions   Statins     Jaw tightness and severe muscle pain- Pravastatin    Prior to Admission medications   Medication Sig Start Date End Date Taking? Authorizing Provider  allopurinol (ZYLOPRIM) 300 MG tablet Take 300 mg by mouth daily. 05/14/20  Yes [provider]  amLODipine (NORVASC) 5 MG tablet Take 1 tablet (5 mg total) by mouth daily. 06/06/22  Yes Weaver, Scott T, PA-C  gabapentin (NEURONTIN) 300 MG capsule Take 900 mg by mouth at bedtime.   Yes [provider]  hydrochlorothiazide (HYDRODIURIL) 25 MG tablet TAKE 1 TABLET (25 MG TOTAL) BY MOUTH DAILY. 04/15/22  Yes Park Liter, MD  lidocaine 4 % Place 1 patch  onto the skin daily as needed (pain).   Yes [provider]  minoxidil (LONITEN) 10 MG tablet Take 10 mg in the morning, take 5 mg at night. 06/05/21  Yes Park Liter, MD  pantoprazole (PROTONIX) 40 MG tablet TAKE 1 TABLET BY MOUTH TWICE A DAY Patient taking differently: Take 40 mg by mouth daily. 01/30/22  Yes Mansouraty, Telford Nab., MD  predniSONE (DELTASONE) 5 MG tablet Take 5 mg by mouth daily. Taper 12/02/21  Yes [provider]  PRESCRIPTION MEDICATION Apply 1 Application topically at bedtime as needed (pain). Compounded pain cream ([place])   Yes [provider]  traMADol (ULTRAM) 50 MG tablet Take 50-150 mg by mouth at bedtime as needed for moderate pain or severe pain.   Yes [provider]  valsartan (DIOVAN) 320 MG tablet TAKE 1 TABLET BY MOUTH EVERY DAY 05/23/22  Yes Park Liter, MD  aspirin EC 81 MG tablet Take 1 tablet (81 mg total) by mouth 2 (two) times daily. To be taken after surgery to prevent blood clots 06/22/22 06/22/23  Aundra Dubin, PA-C  docusate sodium (COLACE) 100 MG capsule Take 1 capsule (100 mg total) by mouth daily as needed. 06/22/22 06/22/23  Aundra Dubin, PA-C  lidocaine (XYLOCAINE) 5 % ointment Apply 1 application topically as needed. Patient not taking: Reported on 06/16/2022 05/30/21   Garvin Fila, MD  methocarbamol (ROBAXIN-750) 750 MG tablet Take 1 tablet (750 mg total) by mouth 2 (two) times daily as needed for muscle spasms. 06/22/22   Aundra Dubin, PA-C  ondansetron (ZOFRAN) 4 MG tablet Take 1 tablet (4 mg total) by mouth every 8 (eight) hours as needed for nausea or vomiting. 06/22/22   Aundra Dubin, PA-C  oxyCODONE-acetaminophen (PERCOCET) 5-325 MG tablet Take 1-2 tablets by mouth every 6 (six) hours as needed. To be taken after surgery 06/22/22   Aundra Dubin, PA-C     Positive ROS: All other systems have been reviewed and were otherwise negative with the exception of those mentioned in  the HPI and as above.  Physical Exam: General: Alert, no acute distress Cardiovascular: No pedal edema Respiratory: No cyanosis, no use of accessory musculature GI: abdomen soft Skin: No lesions in the area of chief complaint Neurologic: Sensation intact distally Psychiatric: Patient is competent for consent with normal mood and affect Lymphatic: no lymphedema  MUSCULOSKELETAL: exam stable  Assessment: right hip degenerative joint disease  Plan: Plan for Procedure(s): RIGHT TOTAL HIP ARTHROPLASTY ANTERIOR APPROACH  The risks benefits and alternatives were discussed with the patient including but not limited to the risks of nonoperative treatment, versus surgical intervention including infection, bleeding, nerve injury,  blood clots, cardiopulmonary complications, morbidity, mortality, among  others, and they were willing to proceed.   Eduard Roux, MD 06/30/2022 5:42 AM

## 2022-06-30 NOTE — Discharge Instructions (Signed)

## 2022-07-01 ENCOUNTER — Encounter (HOSPITAL_COMMUNITY): Payer: Self-pay | Admitting: Orthopaedic Surgery

## 2022-07-01 DIAGNOSIS — Z85828 Personal history of other malignant neoplasm of skin: Secondary | ICD-10-CM | POA: Diagnosis not present

## 2022-07-01 DIAGNOSIS — Z87891 Personal history of nicotine dependence: Secondary | ICD-10-CM | POA: Diagnosis not present

## 2022-07-01 DIAGNOSIS — Z7982 Long term (current) use of aspirin: Secondary | ICD-10-CM | POA: Diagnosis not present

## 2022-07-01 DIAGNOSIS — I10 Essential (primary) hypertension: Secondary | ICD-10-CM | POA: Diagnosis not present

## 2022-07-01 DIAGNOSIS — M1611 Unilateral primary osteoarthritis, right hip: Secondary | ICD-10-CM | POA: Diagnosis not present

## 2022-07-01 DIAGNOSIS — Z79899 Other long term (current) drug therapy: Secondary | ICD-10-CM | POA: Diagnosis not present

## 2022-07-01 DIAGNOSIS — M94251 Chondromalacia, right hip: Secondary | ICD-10-CM | POA: Diagnosis not present

## 2022-07-01 DIAGNOSIS — Z8673 Personal history of transient ischemic attack (TIA), and cerebral infarction without residual deficits: Secondary | ICD-10-CM | POA: Diagnosis not present

## 2022-07-01 LAB — CBC
HCT: 35.5 % — ABNORMAL LOW (ref 39.0–52.0)
Hemoglobin: 11.6 g/dL — ABNORMAL LOW (ref 13.0–17.0)
MCH: 28.7 pg (ref 26.0–34.0)
MCHC: 32.7 g/dL (ref 30.0–36.0)
MCV: 87.9 fL (ref 80.0–100.0)
Platelets: 213 10*3/uL (ref 150–400)
RBC: 4.04 MIL/uL — ABNORMAL LOW (ref 4.22–5.81)
RDW: 14.1 % (ref 11.5–15.5)
WBC: 10.6 10*3/uL — ABNORMAL HIGH (ref 4.0–10.5)
nRBC: 0 % (ref 0.0–0.2)

## 2022-07-01 NOTE — Progress Notes (Signed)
Subjective: 1 Day Post-Op Procedure(s) (LRB): RIGHT TOTAL HIP ARTHROPLASTY ANTERIOR APPROACH (Right) Patient reports pain as mild.    Objective: Vital signs in last 24 hours: Temp:  [97.4 F (36.3 C)-100.5 F (38.1 C)] 99.2 F (37.3 C) (08/15 0314) Pulse Rate:  [57-96] 93 (08/15 0314) Resp:  [12-20] 20 (08/15 0314) BP: (117-169)/(53-95) 155/53 (08/15 0314) SpO2:  [93 %-99 %] 95 % (08/15 0314)  Intake/Output from previous day: 08/14 0701 - 08/15 0700 In: 1000 [I.V.:800; IV Piggyback:200] Out: 150 [Blood:150] Intake/Output this shift: No intake/output data recorded.  Recent Labs    07/01/22 0700  HGB 11.6*   Recent Labs    07/01/22 0700  WBC 10.6*  RBC 4.04*  HCT 35.5*  PLT 213   No results for input(s): "NA", "K", "CL", "CO2", "BUN", "CREATININE", "GLUCOSE", "CALCIUM" in the last 72 hours. No results for input(s): "LABPT", "INR" in the last 72 hours.  Neurologically intact Neurovascular intact Sensation intact distally Intact pulses distally Dorsiflexion/Plantar flexion intact Incision: dressing C/D/I No cellulitis present Compartment soft   Assessment/Plan: 1 Day Post-Op Procedure(s) (LRB): RIGHT TOTAL HIP ARTHROPLASTY ANTERIOR APPROACH (Right) Advance diet Up with therapy D/C IV fluids WBAT RLE D/c home after first or second PT session once as long as cleared by PT      Aundra Dubin 07/01/2022, 7:53 AM

## 2022-07-01 NOTE — Plan of Care (Signed)
  Problem: Education: Goal: Knowledge of the prescribed therapeutic regimen will improve Outcome: Completed/Met Goal: Understanding of discharge needs will improve Outcome: Completed/Met Goal: Individualized Educational Video(s) Outcome: Completed/Met   Problem: Activity: Goal: Ability to avoid complications of mobility impairment will improve Outcome: Completed/Met Goal: Ability to tolerate increased activity will improve Outcome: Completed/Met   Problem: Clinical Measurements: Goal: Postoperative complications will be avoided or minimized Outcome: Completed/Met   Problem: Pain Management: Goal: Pain level will decrease with appropriate interventions Outcome: Completed/Met   Problem: Skin Integrity: Goal: Will show signs of wound healing Outcome: Completed/Met   Problem: Education: Goal: Knowledge of General Education information will improve Description: Including pain rating scale, medication(s)/side effects and non-pharmacologic comfort measures Outcome: Completed/Met   Problem: Health Behavior/Discharge Planning: Goal: Ability to manage health-related needs will improve Outcome: Completed/Met   Problem: Clinical Measurements: Goal: Ability to maintain clinical measurements within normal limits will improve Outcome: Completed/Met Goal: Will remain free from infection Outcome: Completed/Met Goal: Diagnostic test results will improve Outcome: Completed/Met Goal: Respiratory complications will improve Outcome: Completed/Met Goal: Cardiovascular complication will be avoided Outcome: Completed/Met   Problem: Activity: Goal: Risk for activity intolerance will decrease Outcome: Completed/Met   Problem: Nutrition: Goal: Adequate nutrition will be maintained Outcome: Completed/Met   Problem: Coping: Goal: Level of anxiety will decrease Outcome: Completed/Met   Problem: Elimination: Goal: Will not experience complications related to bowel motility Outcome:  Completed/Met Goal: Will not experience complications related to urinary retention Outcome: Completed/Met   Problem: Pain Managment: Goal: General experience of comfort will improve Outcome: Completed/Met   Problem: Safety: Goal: Ability to remain free from injury will improve Outcome: Completed/Met   Problem: Skin Integrity: Goal: Risk for impaired skin integrity will decrease Outcome: Completed/Met   Problem: Education: Goal: Knowledge of the prescribed therapeutic regimen will improve Outcome: Completed/Met Goal: Understanding of discharge needs will improve Outcome: Completed/Met Goal: Individualized Educational Video(s) Outcome: Completed/Met   Problem: Activity: Goal: Ability to avoid complications of mobility impairment will improve Outcome: Completed/Met Goal: Ability to tolerate increased activity will improve Outcome: Completed/Met   Problem: Clinical Measurements: Goal: Postoperative complications will be avoided or minimized Outcome: Completed/Met   Problem: Pain Management: Goal: Pain level will decrease with appropriate interventions Outcome: Completed/Met   Problem: Skin Integrity: Goal: Will show signs of wound healing Outcome: Completed/Met

## 2022-07-01 NOTE — Progress Notes (Signed)
Physical Therapy Treatment Patient Details Name: Thomas [name] MRN: [id number] DOB: [birth year] Today's Date: 07/01/2022   History of Present Illness The pt is a 71 yo male presenting 8/14 for R THA due to chronic R hip pain.  PMH includes: CVA in 2021, HTN, and HLD.    PT Comments    Patient instructed in HEP and completed reps as below. Re-instructed in use of RW during transfers, gait, and stair training as pt continued to require cues for each activity. Patient's wife present and all questions answered. Patient feels ready to discharge home after morning PT session.    Recommendations for follow up therapy are one component of a multi-disciplinary discharge planning process, led by the attending physician.  Recommendations may be updated based on patient status, additional functional criteria and insurance authorization.  Follow Up Recommendations  Follow physician's recommendations for discharge plan and follow up therapies     Assistance Recommended at Discharge Intermittent Supervision/Assistance  Patient can return home with the following A little help with walking and/or transfers;A little help with bathing/dressing/bathroom;Assistance with cooking/housework;Assistance with feeding;Assist for transportation;Help with stairs or ramp for entrance   Equipment Recommendations  Rolling walker (2 wheels);BSC/3in1    Recommendations for Other Services       Precautions / Restrictions Precautions Precautions: Fall Restrictions Weight Bearing Restrictions: Yes RLE Weight Bearing: Weight bearing as tolerated     Mobility  Bed Mobility Overal bed mobility: Modified Independent             General bed mobility comments: to/from bed with incr effort and incr time    Transfers Overall transfer level: Needs assistance Equipment used: Rolling walker (2 wheels) Transfers: Sit to/from Stand Sit to Stand: Min guard           General transfer comment: cues to slow for  safety, hand placement    Ambulation/Gait Ambulation/Gait assistance: Min guard Gait Distance (Feet): 100 Feet Assistive device: Rolling walker (2 wheels), None Gait Pattern/deviations: Step-through pattern, Decreased stance time - right, Decreased weight shift to right, Antalgic Gait velocity: decreased     General Gait Details: pt with increased antalgic gait with DME and required cues for incr support via UEs through RW, no overt LOB   Stairs   Stairs assistance: Min assist Stair Management: Step to pattern, Backwards, With walker, No rails Number of Stairs: 3 General stair comments: pt required cues for descending with sore leg first   Wheelchair Mobility    Modified Rankin (Stroke Patients Only)       Balance Overall balance assessment: Mild deficits observed, not formally tested                                          Cognition Arousal/Alertness: Awake/alert Behavior During Therapy: Impulsive Overall Cognitive Status: Within Functional Limits for tasks assessed                                 General Comments: slightly impaired awareness, pt impulsive and needing increased cues for safety. generally oriented and able to follow instructions        Exercises Total Joint Exercises Ankle Circles/Pumps: AROM, Right, 10 reps, Seated Quad Sets: AROM, Right, 5 reps, Seated Short Arc Quad: AROM, Right, 10 reps Heel Slides: AROM, Right, Seated, 10 reps Hip ABduction/ADduction: AAROM, Right, 10 reps  Long Arc Quad: AROM, Right, 10 reps, Seated    General Comments General comments (skin integrity, edema, etc.): Wife present      Pertinent Vitals/Pain Pain Assessment Pain Assessment: 0-10 Pain Score: 5  Pain Location: rt hip Pain Descriptors / Indicators: Aching, Operative site guarding Pain Intervention(s): Limited activity within patient's tolerance, Monitored during session, Premedicated before session    Home Living                           Prior Function            PT Goals (current goals can now be found in the care plan section) Acute Rehab PT Goals Patient Stated Goal: return home, get back to walking outside Time For Goal Achievement: 07/14/22 Potential to Achieve Goals: Good Progress towards PT goals: Progressing toward goals    Frequency    7X/week      PT Plan Current plan remains appropriate    Co-evaluation              AM-PAC PT "6 Clicks" Mobility   Outcome Measure  Help needed turning from your back to your side while in a flat bed without using bedrails?: None Help needed moving from lying on your back to sitting on the side of a flat bed without using bedrails?: None Help needed moving to and from a bed to a chair (including a wheelchair)?: A Little Help needed standing up from a chair using your arms (e.g., wheelchair or bedside chair)?: A Little Help needed to walk in hospital room?: A Little Help needed climbing 3-5 steps with a railing? : A Little 6 Click Score: 20    End of Session   Activity Tolerance: Patient tolerated treatment well Patient left: with call bell/phone within reach;in bed;with family/visitor present Nurse Communication: Mobility status;Other (comment) (OK to discharge without BID session) PT Visit Diagnosis: Other abnormalities of gait and mobility (R26.89);Muscle weakness (generalized) (M62.81)     Time: 4709-6283 PT Time Calculation (min) (ACUTE ONLY): 30 min  Charges:  $Gait Training: 8-22 mins $Therapeutic Exercise: 8-22 mins                      [place]  Office [phone]    Rexanne Mano 07/01/2022, 9:53 AM

## 2022-07-01 NOTE — Progress Notes (Signed)
Patient transported to his vehicle via wheelchair by volunteer for discharge home; moves all extremities well; in no acute distress nor complaints of pain nor discomfort; incision on his right hip with Aquacel dressing and is clean, dry and intact; room was checked for all his belongings; discharge instructions concerning his medications, incision care, follow up appointment and when to call the doctor as needed were all discussed with patient and his wife by RN and both verbalized understanding on the instructions given.

## 2022-07-01 NOTE — Discharge Summary (Signed)
Patient ID: Thomas [name] MRN: [id number] DOB/AGE: [birth year] 71 y.o.  Admit date: 06/30/2022 Discharge date: 07/01/2022  Admission Diagnoses:  Principal Problem:   Primary osteoarthritis of right hip Active Problems:   Status post total replacement of right hip   Discharge Diagnoses:  Same  Past Medical History:  Diagnosis Date   Abdominal wall pain 04/02/2021   Acute ischemic stroke ([facility]) 03/22/2020   Avulsed toenail, initial encounter 05/04/2018   Dyspnea on exertion 02/10/2020   ED (erectile dysfunction)    Epidermoid cyst of neck 10/16/2017   Essential hypertension 07/21/2017   GERD (gastroesophageal reflux disease)    Gout of multiple sites 07/21/2017   History of kidney stones    History of squamous cell carcinoma excision    Hyperlipidemia    Hypertension not at goal 01/19/2014   It band syndrome, left 03/19/2020   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/24/2020   Left ureteral calculus    Lower abdominal pain 08/27/2015   Metatarsalgia of both feet 03/19/2020   Multiple joint pain 07/16/2018   Pain in joint, shoulder region 12/11/2015   Right flank pain 04/02/2021   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 male who was admitted 06/30/2022 for operative treatment ofPrimary osteoarthritis of right hip. Patient has severe unremitting pain that affects sleep, daily activities, and work/hobbies. After pre-op clearance the patient was taken to the operating room on 06/30/2022 and underwent  Procedure(s): RIGHT TOTAL HIP ARTHROPLASTY ANTERIOR APPROACH.    Patient was given perioperative antibiotics:  Anti-infectives (From admission, onward)    Start     Dose/Rate Route  Frequency Ordered Stop   06/30/22 1100  ceFAZolin (ANCEF) IVPB 2g/100 mL premix        2 g 200 mL/hr over 30 Minutes Intravenous Every 6 hours 06/30/22 1054 06/30/22 1647   06/30/22 0800  vancomycin (VANCOCIN) powder  Status:  Discontinued          As needed 06/30/22 0800 06/30/22 0905   06/30/22 0600  ceFAZolin (ANCEF) IVPB 2g/100 mL premix        2 g 200 mL/hr over 30 Minutes Intravenous On call to O.R. 06/30/22 0541 06/30/22 0730   06/30/22 0545  ceFAZolin (ANCEF) 2-4 GM/100ML-% IVPB       Note to Pharmacy: Ladoris Gene A: cabinet override      06/30/22 0545 06/30/22 0738        Patient was given sequential compression devices, early ambulation, and chemoprophylaxis to prevent DVT.  Patient benefited maximally from hospital stay and there were no complications.    Recent vital signs: Patient Vitals for the past 24 hrs:  BP Temp Temp src Pulse Resp SpO2  07/01/22 0314 (!) 155/53 99.2 F (37.3 C) Oral 93 20 95 %  06/30/22 2326 (!) 137/54 97.9 F (36.6 C) Oral 95 20 99 %  06/30/22 2122 -- (!) 100.5 F (38.1 C) Oral -- -- --  06/30/22 2013 (!) 131/54 99.8 F (37.7 C) Oral 88 20 97 %  06/30/22 1558 (!) 156/62 (!) 97.5 F (36.4 C) Oral 96 18 99 %  06/30/22 1104 (!) 169/72 -- -- (!) 57 18 99 %  06/30/22 1045 (!) 144/67 (!) 97.5 F (36.4 C) --  65 12 94 %  06/30/22 1030 (!) 148/58 -- -- 74 14 96 %  06/30/22 1015 (!) 149/95 -- -- 60 12 97 %  06/30/22 1000 (!) 147/67 -- -- 60 13 96 %  06/30/22 0945 (!) 148/67 -- -- 65 17 96 %  06/30/22 0930 139/65 -- -- 66 14 96 %  06/30/22 0925 117/66 -- -- 62 12 95 %  06/30/22 0910 118/62 (!) 97.4 F (36.3 C) -- 77 12 93 %     Recent laboratory studies:  Recent Labs    07/01/22 0700  WBC 10.6*  HGB 11.6*  HCT 35.5*  PLT 213     Discharge Medications:   Allergies as of 07/01/2022       Reactions   Statins    Jaw tightness and severe muscle pain- Pravastatin         Medication List     STOP taking these medications     traMADol 50 MG tablet Commonly known as: ULTRAM       TAKE these medications    allopurinol 300 MG tablet Commonly known as: ZYLOPRIM Take 300 mg by mouth daily.   amLODipine 5 MG tablet Commonly known as: NORVASC Take 1 tablet (5 mg total) by mouth daily.   aspirin EC 81 MG tablet Take 1 tablet (81 mg total) by mouth 2 (two) times daily. To be taken after surgery to prevent blood clots   docusate sodium 100 MG capsule Commonly known as: Colace Take 1 capsule (100 mg total) by mouth daily as needed.   gabapentin 300 MG capsule Commonly known as: NEURONTIN Take 900 mg by mouth at bedtime.   hydrochlorothiazide 25 MG tablet Commonly known as: HYDRODIURIL TAKE 1 TABLET (25 MG TOTAL) BY MOUTH DAILY.   lidocaine 4 % Place 1 patch onto the skin daily as needed (pain).   lidocaine 5 % ointment Commonly known as: XYLOCAINE Apply 1 application topically as needed.   methocarbamol 750 MG tablet Commonly known as: Robaxin-750 Take 1 tablet (750 mg total) by mouth 2 (two) times daily as needed for muscle spasms.   minoxidil 10 MG tablet Commonly known as: LONITEN Take 10 mg in the morning, take 5 mg at night.   ondansetron 4 MG tablet Commonly known as: Zofran Take 1 tablet (4 mg total) by mouth every 8 (eight) hours as needed for nausea or vomiting.   oxyCODONE-acetaminophen 5-325 MG tablet Commonly known as: Percocet Take 1-2 tablets by mouth every 6 (six) hours as needed. To be taken after surgery   pantoprazole 40 MG tablet Commonly known as: PROTONIX TAKE 1 TABLET BY MOUTH TWICE A DAY What changed: when to take this   predniSONE 5 MG tablet Commonly known as: DELTASONE Take 5 mg by mouth daily. Taper   PRESCRIPTION MEDICATION Apply 1 Application topically at bedtime as needed (pain). Compounded pain cream ([facility])   valsartan 320 MG tablet Commonly known as: DIOVAN TAKE 1 TABLET BY MOUTH EVERY DAY               Durable Medical  Equipment  (From admission, onward)           Start     Ordered   06/30/22 1108  DME Walker rolling  Once       Question:  Patient needs a walker to treat with the following condition  Answer:  History of hip replacement   06/30/22 1107   06/30/22 1108  DME 3 n 1  Once  06/30/22 1107   06/30/22 1108  DME Bedside commode  Once       Question:  Patient needs a bedside commode to treat with the following condition  Answer:  History of hip replacement   06/30/22 1107            Diagnostic Studies: DG Pelvis Portable  Result Date: 06/30/2022 CLINICAL DATA:  Status post right hip arthroplasty EXAM: PORTABLE PELVIS 1-2 VIEWS COMPARISON:  None Available. FINDINGS: Status post right hip total arthroplasty with expected overlying postoperative change. No obvious perihardware fracture or component malpositioning. IMPRESSION: Status post right hip total arthroplasty with expected overlying postoperative change. No obvious perihardware fracture or component malpositioning. Electronically Signed   By: Delanna Ahmadi M.D.   On: 06/30/2022 09:50   DG HIP UNILAT WITH PELVIS 1V RIGHT  Result Date: 06/30/2022 CLINICAL DATA:  Fluoroscopic assistance for right hip arthroplasty EXAM: DG HIP (WITH OR WITHOUT PELVIS) 1V RIGHT COMPARISON:  05/09/2022 FINDINGS: Fluoroscopic images show right hip arthroplasty. Fluoroscopy time 22 seconds. Radiation dose 4.4 mGy. IMPRESSION: Fluoroscopic assistance was provided for right hip arthroplasty. Electronically Signed   By: Elmer Picker M.D.   On: 06/30/2022 08:56   DG C-Arm 1-60 Min-No Report  Result Date: 06/30/2022 Fluoroscopy was utilized by the requesting physician.  No radiographic interpretation.    Disposition:      Follow-up Information     [facility], [facility] Follow up.   Specialty: [place] Why: The home health agency will contact you for the first home visit Contact information: [address] [place]  [address] [phone]         Leandrew Koyanagi, MD. Schedule an appointment as soon as possible for a visit in 2 week(s).   Specialty: Orthopedic Surgery Contact information: [address] [place] [address] [phone]                  Signed: Aundra Dubin 07/01/2022, 7:54 AM

## 2022-07-02 DIAGNOSIS — K219 Gastro-esophageal reflux disease without esophagitis: Secondary | ICD-10-CM | POA: Diagnosis not present

## 2022-07-02 DIAGNOSIS — I10 Essential (primary) hypertension: Secondary | ICD-10-CM | POA: Diagnosis not present

## 2022-07-02 DIAGNOSIS — Z96641 Presence of right artificial hip joint: Secondary | ICD-10-CM | POA: Diagnosis not present

## 2022-07-02 DIAGNOSIS — R1310 Dysphagia, unspecified: Secondary | ICD-10-CM | POA: Diagnosis not present

## 2022-07-02 DIAGNOSIS — Z7982 Long term (current) use of aspirin: Secondary | ICD-10-CM | POA: Diagnosis not present

## 2022-07-02 DIAGNOSIS — N529 Male erectile dysfunction, unspecified: Secondary | ICD-10-CM | POA: Diagnosis not present

## 2022-07-02 DIAGNOSIS — M1A9XX0 Chronic gout, unspecified, without tophus (tophi): Secondary | ICD-10-CM | POA: Diagnosis not present

## 2022-07-02 DIAGNOSIS — Z85828 Personal history of other malignant neoplasm of skin: Secondary | ICD-10-CM | POA: Diagnosis not present

## 2022-07-02 DIAGNOSIS — M1612 Unilateral primary osteoarthritis, left hip: Secondary | ICD-10-CM | POA: Diagnosis not present

## 2022-07-02 DIAGNOSIS — Z87891 Personal history of nicotine dependence: Secondary | ICD-10-CM | POA: Diagnosis not present

## 2022-07-02 DIAGNOSIS — E785 Hyperlipidemia, unspecified: Secondary | ICD-10-CM | POA: Diagnosis not present

## 2022-07-02 DIAGNOSIS — N201 Calculus of ureter: Secondary | ICD-10-CM | POA: Diagnosis not present

## 2022-07-02 DIAGNOSIS — Z9181 History of falling: Secondary | ICD-10-CM | POA: Diagnosis not present

## 2022-07-02 DIAGNOSIS — Z8673 Personal history of transient ischemic attack (TIA), and cerebral infarction without residual deficits: Secondary | ICD-10-CM | POA: Diagnosis not present

## 2022-07-02 DIAGNOSIS — Z471 Aftercare following joint replacement surgery: Secondary | ICD-10-CM | POA: Diagnosis not present

## 2022-07-02 DIAGNOSIS — M7742 Metatarsalgia, left foot: Secondary | ICD-10-CM | POA: Diagnosis not present

## 2022-07-02 DIAGNOSIS — Z87442 Personal history of urinary calculi: Secondary | ICD-10-CM | POA: Diagnosis not present

## 2022-07-02 DIAGNOSIS — M7741 Metatarsalgia, right foot: Secondary | ICD-10-CM | POA: Diagnosis not present

## 2022-07-02 DIAGNOSIS — M7632 Iliotibial band syndrome, left leg: Secondary | ICD-10-CM | POA: Diagnosis not present

## 2022-07-02 DIAGNOSIS — K551 Chronic vascular disorders of intestine: Secondary | ICD-10-CM | POA: Diagnosis not present

## 2022-07-04 DIAGNOSIS — N201 Calculus of ureter: Secondary | ICD-10-CM | POA: Diagnosis not present

## 2022-07-04 DIAGNOSIS — K551 Chronic vascular disorders of intestine: Secondary | ICD-10-CM | POA: Diagnosis not present

## 2022-07-04 DIAGNOSIS — M7742 Metatarsalgia, left foot: Secondary | ICD-10-CM | POA: Diagnosis not present

## 2022-07-04 DIAGNOSIS — R1310 Dysphagia, unspecified: Secondary | ICD-10-CM | POA: Diagnosis not present

## 2022-07-04 DIAGNOSIS — Z7982 Long term (current) use of aspirin: Secondary | ICD-10-CM | POA: Diagnosis not present

## 2022-07-04 DIAGNOSIS — M7741 Metatarsalgia, right foot: Secondary | ICD-10-CM | POA: Diagnosis not present

## 2022-07-04 DIAGNOSIS — K219 Gastro-esophageal reflux disease without esophagitis: Secondary | ICD-10-CM | POA: Diagnosis not present

## 2022-07-04 DIAGNOSIS — M1612 Unilateral primary osteoarthritis, left hip: Secondary | ICD-10-CM | POA: Diagnosis not present

## 2022-07-04 DIAGNOSIS — I10 Essential (primary) hypertension: Secondary | ICD-10-CM | POA: Diagnosis not present

## 2022-07-04 DIAGNOSIS — Z85828 Personal history of other malignant neoplasm of skin: Secondary | ICD-10-CM | POA: Diagnosis not present

## 2022-07-04 DIAGNOSIS — M1A9XX0 Chronic gout, unspecified, without tophus (tophi): Secondary | ICD-10-CM | POA: Diagnosis not present

## 2022-07-04 DIAGNOSIS — N529 Male erectile dysfunction, unspecified: Secondary | ICD-10-CM | POA: Diagnosis not present

## 2022-07-04 DIAGNOSIS — Z471 Aftercare following joint replacement surgery: Secondary | ICD-10-CM | POA: Diagnosis not present

## 2022-07-04 DIAGNOSIS — Z87891 Personal history of nicotine dependence: Secondary | ICD-10-CM | POA: Diagnosis not present

## 2022-07-04 DIAGNOSIS — Z96641 Presence of right artificial hip joint: Secondary | ICD-10-CM | POA: Diagnosis not present

## 2022-07-04 DIAGNOSIS — E785 Hyperlipidemia, unspecified: Secondary | ICD-10-CM | POA: Diagnosis not present

## 2022-07-04 DIAGNOSIS — M7632 Iliotibial band syndrome, left leg: Secondary | ICD-10-CM | POA: Diagnosis not present

## 2022-07-04 DIAGNOSIS — Z9181 History of falling: Secondary | ICD-10-CM | POA: Diagnosis not present

## 2022-07-04 DIAGNOSIS — Z8673 Personal history of transient ischemic attack (TIA), and cerebral infarction without residual deficits: Secondary | ICD-10-CM | POA: Diagnosis not present

## 2022-07-04 DIAGNOSIS — Z87442 Personal history of urinary calculi: Secondary | ICD-10-CM | POA: Diagnosis not present

## 2022-07-06 ENCOUNTER — Encounter: Payer: Self-pay | Admitting: Orthopaedic Surgery

## 2022-07-07 ENCOUNTER — Other Ambulatory Visit: Payer: Self-pay | Admitting: Orthopaedic Surgery

## 2022-07-07 DIAGNOSIS — M7632 Iliotibial band syndrome, left leg: Secondary | ICD-10-CM | POA: Diagnosis not present

## 2022-07-07 DIAGNOSIS — Z85828 Personal history of other malignant neoplasm of skin: Secondary | ICD-10-CM | POA: Diagnosis not present

## 2022-07-07 DIAGNOSIS — Z471 Aftercare following joint replacement surgery: Secondary | ICD-10-CM | POA: Diagnosis not present

## 2022-07-07 DIAGNOSIS — M1612 Unilateral primary osteoarthritis, left hip: Secondary | ICD-10-CM | POA: Diagnosis not present

## 2022-07-07 DIAGNOSIS — Z87891 Personal history of nicotine dependence: Secondary | ICD-10-CM | POA: Diagnosis not present

## 2022-07-07 DIAGNOSIS — N201 Calculus of ureter: Secondary | ICD-10-CM | POA: Diagnosis not present

## 2022-07-07 DIAGNOSIS — R1310 Dysphagia, unspecified: Secondary | ICD-10-CM | POA: Diagnosis not present

## 2022-07-07 DIAGNOSIS — Z87442 Personal history of urinary calculi: Secondary | ICD-10-CM | POA: Diagnosis not present

## 2022-07-07 DIAGNOSIS — E785 Hyperlipidemia, unspecified: Secondary | ICD-10-CM | POA: Diagnosis not present

## 2022-07-07 DIAGNOSIS — Z9181 History of falling: Secondary | ICD-10-CM | POA: Diagnosis not present

## 2022-07-07 DIAGNOSIS — I10 Essential (primary) hypertension: Secondary | ICD-10-CM | POA: Diagnosis not present

## 2022-07-07 DIAGNOSIS — M7741 Metatarsalgia, right foot: Secondary | ICD-10-CM | POA: Diagnosis not present

## 2022-07-07 DIAGNOSIS — Z8673 Personal history of transient ischemic attack (TIA), and cerebral infarction without residual deficits: Secondary | ICD-10-CM | POA: Diagnosis not present

## 2022-07-07 DIAGNOSIS — N529 Male erectile dysfunction, unspecified: Secondary | ICD-10-CM | POA: Diagnosis not present

## 2022-07-07 DIAGNOSIS — Z96641 Presence of right artificial hip joint: Secondary | ICD-10-CM | POA: Diagnosis not present

## 2022-07-07 DIAGNOSIS — M1A9XX0 Chronic gout, unspecified, without tophus (tophi): Secondary | ICD-10-CM | POA: Diagnosis not present

## 2022-07-07 DIAGNOSIS — K551 Chronic vascular disorders of intestine: Secondary | ICD-10-CM | POA: Diagnosis not present

## 2022-07-07 DIAGNOSIS — Z7982 Long term (current) use of aspirin: Secondary | ICD-10-CM | POA: Diagnosis not present

## 2022-07-07 DIAGNOSIS — M7742 Metatarsalgia, left foot: Secondary | ICD-10-CM | POA: Diagnosis not present

## 2022-07-07 DIAGNOSIS — K219 Gastro-esophageal reflux disease without esophagitis: Secondary | ICD-10-CM | POA: Diagnosis not present

## 2022-07-07 MED ORDER — TRAMADOL HCL 50 MG PO TABS: 50.0000 mg | ORAL_TABLET | Freq: Every day | ORAL | 0 refills | Status: DC | PRN

## 2022-07-09 ENCOUNTER — Other Ambulatory Visit: Payer: Self-pay | Admitting: Orthopaedic Surgery

## 2022-07-09 ENCOUNTER — Telehealth: Payer: Self-pay

## 2022-07-09 DIAGNOSIS — M7741 Metatarsalgia, right foot: Secondary | ICD-10-CM | POA: Diagnosis not present

## 2022-07-09 DIAGNOSIS — R1310 Dysphagia, unspecified: Secondary | ICD-10-CM | POA: Diagnosis not present

## 2022-07-09 DIAGNOSIS — Z85828 Personal history of other malignant neoplasm of skin: Secondary | ICD-10-CM | POA: Diagnosis not present

## 2022-07-09 DIAGNOSIS — K551 Chronic vascular disorders of intestine: Secondary | ICD-10-CM | POA: Diagnosis not present

## 2022-07-09 DIAGNOSIS — K219 Gastro-esophageal reflux disease without esophagitis: Secondary | ICD-10-CM | POA: Diagnosis not present

## 2022-07-09 DIAGNOSIS — N529 Male erectile dysfunction, unspecified: Secondary | ICD-10-CM | POA: Diagnosis not present

## 2022-07-09 DIAGNOSIS — Z9181 History of falling: Secondary | ICD-10-CM | POA: Diagnosis not present

## 2022-07-09 DIAGNOSIS — Z471 Aftercare following joint replacement surgery: Secondary | ICD-10-CM | POA: Diagnosis not present

## 2022-07-09 DIAGNOSIS — I10 Essential (primary) hypertension: Secondary | ICD-10-CM | POA: Diagnosis not present

## 2022-07-09 DIAGNOSIS — M1612 Unilateral primary osteoarthritis, left hip: Secondary | ICD-10-CM | POA: Diagnosis not present

## 2022-07-09 DIAGNOSIS — Z87891 Personal history of nicotine dependence: Secondary | ICD-10-CM | POA: Diagnosis not present

## 2022-07-09 DIAGNOSIS — Z96641 Presence of right artificial hip joint: Secondary | ICD-10-CM | POA: Diagnosis not present

## 2022-07-09 DIAGNOSIS — M7742 Metatarsalgia, left foot: Secondary | ICD-10-CM | POA: Diagnosis not present

## 2022-07-09 DIAGNOSIS — Z8673 Personal history of transient ischemic attack (TIA), and cerebral infarction without residual deficits: Secondary | ICD-10-CM | POA: Diagnosis not present

## 2022-07-09 DIAGNOSIS — Z7982 Long term (current) use of aspirin: Secondary | ICD-10-CM | POA: Diagnosis not present

## 2022-07-09 DIAGNOSIS — N201 Calculus of ureter: Secondary | ICD-10-CM | POA: Diagnosis not present

## 2022-07-09 DIAGNOSIS — Z87442 Personal history of urinary calculi: Secondary | ICD-10-CM | POA: Diagnosis not present

## 2022-07-09 DIAGNOSIS — M7632 Iliotibial band syndrome, left leg: Secondary | ICD-10-CM | POA: Diagnosis not present

## 2022-07-09 DIAGNOSIS — M1A9XX0 Chronic gout, unspecified, without tophus (tophi): Secondary | ICD-10-CM | POA: Diagnosis not present

## 2022-07-09 DIAGNOSIS — E785 Hyperlipidemia, unspecified: Secondary | ICD-10-CM | POA: Diagnosis not present

## 2022-07-09 NOTE — Telephone Encounter (Signed)
[facility] called concerning patient's script for tramadol. He said that patient just had 60tablets filled on 06/26/2022 therefore he could not refill his tramadol yet. He also went on to say that the patient states he was told to take 4 tablets daily. He then ask for me to call the patient for clarification. I called and spoke with patient. He said that he was in fact taking 3-4tablets of tramadol daily as Dr.Xu had told him to take based on their conversations through [facility]. I read those messages and told him that nowhere did Dr.Xu instruct him to take more than 1-2 daily. He wanted to take tramadol tablets instead of oxycodone because the oxycodone does not agree with him. He is under a pain contract. I further went on to explain that his pain management doctor should be aware that he was going to have surgery since he would be needing more pain medicine than what he may be currently taking prior to surgery. He said that it has nothing to do with his pain medicine physician. He feels that since the tramadol is not as strong as oxycodone, he can take more as he sees fit. I explained that Dr.Xu prescribed the tramadol as he felt was safe for the patient to take. The pharmacist is going to call his pain management physician to get clarification about the amount he was told to take since the SIG on last script does not match what the patient is stating. At that point, he may or may not be able to get the tramadol filled early. Patient was not happy, but states understanding.

## 2022-07-10 NOTE — Telephone Encounter (Signed)
Can you check with the pharmacy about this issue please?  Thank you.

## 2022-07-10 NOTE — Telephone Encounter (Signed)
Just spoke to the patient and he said the prescription went through and everything is good.

## 2022-07-11 DIAGNOSIS — Z87442 Personal history of urinary calculi: Secondary | ICD-10-CM | POA: Diagnosis not present

## 2022-07-11 DIAGNOSIS — N201 Calculus of ureter: Secondary | ICD-10-CM | POA: Diagnosis not present

## 2022-07-11 DIAGNOSIS — R1310 Dysphagia, unspecified: Secondary | ICD-10-CM | POA: Diagnosis not present

## 2022-07-11 DIAGNOSIS — Z87891 Personal history of nicotine dependence: Secondary | ICD-10-CM | POA: Diagnosis not present

## 2022-07-11 DIAGNOSIS — M7741 Metatarsalgia, right foot: Secondary | ICD-10-CM | POA: Diagnosis not present

## 2022-07-11 DIAGNOSIS — Z7982 Long term (current) use of aspirin: Secondary | ICD-10-CM | POA: Diagnosis not present

## 2022-07-11 DIAGNOSIS — Z9181 History of falling: Secondary | ICD-10-CM | POA: Diagnosis not present

## 2022-07-11 DIAGNOSIS — M7632 Iliotibial band syndrome, left leg: Secondary | ICD-10-CM | POA: Diagnosis not present

## 2022-07-11 DIAGNOSIS — Z471 Aftercare following joint replacement surgery: Secondary | ICD-10-CM | POA: Diagnosis not present

## 2022-07-11 DIAGNOSIS — K551 Chronic vascular disorders of intestine: Secondary | ICD-10-CM | POA: Diagnosis not present

## 2022-07-11 DIAGNOSIS — I10 Essential (primary) hypertension: Secondary | ICD-10-CM | POA: Diagnosis not present

## 2022-07-11 DIAGNOSIS — Z8673 Personal history of transient ischemic attack (TIA), and cerebral infarction without residual deficits: Secondary | ICD-10-CM | POA: Diagnosis not present

## 2022-07-11 DIAGNOSIS — M1612 Unilateral primary osteoarthritis, left hip: Secondary | ICD-10-CM | POA: Diagnosis not present

## 2022-07-11 DIAGNOSIS — G4733 Obstructive sleep apnea (adult) (pediatric): Secondary | ICD-10-CM | POA: Diagnosis not present

## 2022-07-11 DIAGNOSIS — E785 Hyperlipidemia, unspecified: Secondary | ICD-10-CM | POA: Diagnosis not present

## 2022-07-11 DIAGNOSIS — N529 Male erectile dysfunction, unspecified: Secondary | ICD-10-CM | POA: Diagnosis not present

## 2022-07-11 DIAGNOSIS — Z85828 Personal history of other malignant neoplasm of skin: Secondary | ICD-10-CM | POA: Diagnosis not present

## 2022-07-11 DIAGNOSIS — K219 Gastro-esophageal reflux disease without esophagitis: Secondary | ICD-10-CM | POA: Diagnosis not present

## 2022-07-11 DIAGNOSIS — M1A9XX0 Chronic gout, unspecified, without tophus (tophi): Secondary | ICD-10-CM | POA: Diagnosis not present

## 2022-07-11 DIAGNOSIS — M7742 Metatarsalgia, left foot: Secondary | ICD-10-CM | POA: Diagnosis not present

## 2022-07-11 DIAGNOSIS — Z96641 Presence of right artificial hip joint: Secondary | ICD-10-CM | POA: Diagnosis not present

## 2022-07-14 DIAGNOSIS — M7742 Metatarsalgia, left foot: Secondary | ICD-10-CM | POA: Diagnosis not present

## 2022-07-14 DIAGNOSIS — Z8673 Personal history of transient ischemic attack (TIA), and cerebral infarction without residual deficits: Secondary | ICD-10-CM | POA: Diagnosis not present

## 2022-07-14 DIAGNOSIS — Z85828 Personal history of other malignant neoplasm of skin: Secondary | ICD-10-CM | POA: Diagnosis not present

## 2022-07-14 DIAGNOSIS — K551 Chronic vascular disorders of intestine: Secondary | ICD-10-CM | POA: Diagnosis not present

## 2022-07-14 DIAGNOSIS — M1612 Unilateral primary osteoarthritis, left hip: Secondary | ICD-10-CM | POA: Diagnosis not present

## 2022-07-14 DIAGNOSIS — E785 Hyperlipidemia, unspecified: Secondary | ICD-10-CM | POA: Diagnosis not present

## 2022-07-14 DIAGNOSIS — I10 Essential (primary) hypertension: Secondary | ICD-10-CM | POA: Diagnosis not present

## 2022-07-14 DIAGNOSIS — Z96641 Presence of right artificial hip joint: Secondary | ICD-10-CM | POA: Diagnosis not present

## 2022-07-14 DIAGNOSIS — R1310 Dysphagia, unspecified: Secondary | ICD-10-CM | POA: Diagnosis not present

## 2022-07-14 DIAGNOSIS — M1A9XX0 Chronic gout, unspecified, without tophus (tophi): Secondary | ICD-10-CM | POA: Diagnosis not present

## 2022-07-14 DIAGNOSIS — N529 Male erectile dysfunction, unspecified: Secondary | ICD-10-CM | POA: Diagnosis not present

## 2022-07-14 DIAGNOSIS — M7632 Iliotibial band syndrome, left leg: Secondary | ICD-10-CM | POA: Diagnosis not present

## 2022-07-14 DIAGNOSIS — Z9181 History of falling: Secondary | ICD-10-CM | POA: Diagnosis not present

## 2022-07-14 DIAGNOSIS — Z7982 Long term (current) use of aspirin: Secondary | ICD-10-CM | POA: Diagnosis not present

## 2022-07-14 DIAGNOSIS — K219 Gastro-esophageal reflux disease without esophagitis: Secondary | ICD-10-CM | POA: Diagnosis not present

## 2022-07-14 DIAGNOSIS — N201 Calculus of ureter: Secondary | ICD-10-CM | POA: Diagnosis not present

## 2022-07-14 DIAGNOSIS — Z87891 Personal history of nicotine dependence: Secondary | ICD-10-CM | POA: Diagnosis not present

## 2022-07-14 DIAGNOSIS — Z87442 Personal history of urinary calculi: Secondary | ICD-10-CM | POA: Diagnosis not present

## 2022-07-14 DIAGNOSIS — M7741 Metatarsalgia, right foot: Secondary | ICD-10-CM | POA: Diagnosis not present

## 2022-07-14 DIAGNOSIS — Z471 Aftercare following joint replacement surgery: Secondary | ICD-10-CM | POA: Diagnosis not present

## 2022-07-14 DIAGNOSIS — G4733 Obstructive sleep apnea (adult) (pediatric): Secondary | ICD-10-CM | POA: Diagnosis not present

## 2022-07-15 ENCOUNTER — Ambulatory Visit (INDEPENDENT_AMBULATORY_CARE_PROVIDER_SITE_OTHER): Payer: Medicare HMO | Admitting: Physician Assistant

## 2022-07-15 ENCOUNTER — Encounter: Payer: Self-pay | Admitting: Orthopaedic Surgery

## 2022-07-15 DIAGNOSIS — G4733 Obstructive sleep apnea (adult) (pediatric): Secondary | ICD-10-CM | POA: Diagnosis not present

## 2022-07-15 DIAGNOSIS — Z96641 Presence of right artificial hip joint: Secondary | ICD-10-CM

## 2022-07-15 NOTE — Progress Notes (Signed)
Post-Op Visit Note   Patient: Thomas [name]           Date of Birth: Oct 31, 1951           MRN: [id number] Visit Date: 07/15/2022 PCP: Michael Boston, MD   Assessment & Plan:  Chief Complaint:  Chief Complaint  Patient presents with   Right Hip - Follow-up    Right total hip arthroplasty 06/30/2022   Visit Diagnoses:  1. History of total hip replacement, right     Plan: Patient is a pleasant 71 year old gentleman who comes in today 2 weeks status post right total hip replacement 06/30/2022.  He has been doing well.  Minimal pain which is relieved with tramadol and Tylenol.  He has been compliant taking a baby aspirin twice daily for DVT prophylaxis.  He has been working on a home exercise program with great results.  He is ambulating primarily unassisted but does carry a cane.  Examination of his right hip reveals a fully healed surgical scar without complication.  Calves are soft nontender.  He is neurovascular intact distally.  At this point, he will continue with his home exercise program.  Continue with baby aspirin twice daily for DVT prophylaxis for another 4 weeks.  Follow-up in 4 weeks for repeat evaluation and x-rays of the pelvis.  Call with concerns or questions.  Follow-Up Instructions: Return in about 4 weeks (around 08/12/2022).   Orders:  No orders of the defined types were placed in this encounter.  No orders of the defined types were placed in this encounter.   Imaging: No new imaging  PMFS History: Patient Active Problem List   Diagnosis Date Noted   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   Preoperative cardiovascular examination 06/05/2022   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   Primary osteoarthritis of left hip 03/19/2022   Chronic gouty arthritis 04/23/2021   Primary osteoarthritis 04/23/2021   Abdominal wall pain 04/02/2021   Dysphagia 04/02/2021   Right flank pain 04/02/2021   Wears glasses    Left ureteral calculus     Hypertension    Hyperlipidemia    History of squamous cell carcinoma excision    ED (erectile dysfunction)    Chronic gout    Right sided abdominal pain 01/14/2021   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/24/2020   Acute ischemic stroke ([facility]) 03/22/2020   It band syndrome, left 03/19/2020   Metatarsalgia of both feet 03/19/2020   Superior mesenteric artery stenosis ([facility]) 02/24/2020   Dyspnea on exertion 02/10/2020   Polyarthralgia 07/16/2018   Essential hypertension 07/21/2017   Gout of multiple sites 07/21/2017   Past Medical History:  Diagnosis Date   Abdominal wall pain 04/02/2021   Acute ischemic stroke ([facility]) 03/22/2020   Avulsed toenail, initial encounter 05/04/2018   Dyspnea on exertion 02/10/2020   ED (erectile dysfunction)    Epidermoid cyst of neck 10/16/2017   Essential hypertension 07/21/2017   GERD (gastroesophageal reflux disease)    Gout of multiple sites 07/21/2017   History of kidney stones    History of squamous cell carcinoma excision    Hyperlipidemia    Hypertension not at goal 01/19/2014   It band syndrome, left 03/19/2020   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/24/2020   Left ureteral calculus    Lower abdominal pain 08/27/2015   Metatarsalgia of both feet 03/19/2020   Multiple joint pain 07/16/2018   Pain in joint, shoulder region 12/11/2015   Right flank  pain 04/02/2021   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   Urgency of urination    URI (upper respiratory infection) 11/08/2014   Wears glasses     Family History  Problem Relation Age of Onset   Heart disease Mother 45   Alcohol abuse Mother    Depression Mother    Hypertension Father    Sleep apnea Father    Cancer Sister        hodgkins, breast   Colon cancer Neg Hx    Pancreatic cancer Neg Hx    Rectal cancer Neg Hx    Stomach cancer Neg Hx    Esophageal cancer Neg Hx    Inflammatory bowel disease Neg Hx    Liver disease Neg Hx     Past  Surgical History:  Procedure Laterality Date   COLONOSCOPY WITH PROPOFOL  2017   CYSTOSCOPY/RETROGRADE/URETEROSCOPY/STONE EXTRACTION WITH BASKET Left 04/16/2018   Procedure: CYSTOSCOPY/RETROGRADE/URETEROSCOPY/STONE EXTRACTION WITH BASKET/ HOLMIUM LASER LITHOTRIPSY/ STENT PLACEMENT;  Surgeon: Ardis Hughs, MD;  Location: [facility];  Service: Urology;  Laterality: Left;   HERNIA REPAIR Right 2022   inguinal   HOLMIUM LASER APPLICATION Left 30/16/0109   Procedure: HOLMIUM LASER APPLICATION;  Surgeon: Ardis Hughs, MD;  Location: [facility];  Service: Urology;  Laterality: Left;   TOTAL HIP ARTHROPLASTY Right 06/30/2022   Procedure: RIGHT TOTAL HIP ARTHROPLASTY ANTERIOR APPROACH;  Surgeon: Leandrew Koyanagi, MD;  Location: [facility];  Service: Orthopedics;  Laterality: Right;  3-C   Social History   Occupational History   Occupation: Pharmacist, hospital  Tobacco Use   Smoking status: Former    Packs/day: 0.30    Years: 20.00    Total pack years: 6.00    Types: Cigarettes    Quit date: 10/17/1994    Years since quitting: 27.7   Smokeless tobacco: Never  Vaping Use   Vaping Use: Not on file  Substance and Sexual Activity   Alcohol use: Not Currently   Drug use: No   Sexual activity: Yes

## 2022-07-16 ENCOUNTER — Other Ambulatory Visit: Payer: Self-pay | Admitting: Cardiology

## 2022-07-16 DIAGNOSIS — Z9181 History of falling: Secondary | ICD-10-CM | POA: Diagnosis not present

## 2022-07-16 DIAGNOSIS — K551 Chronic vascular disorders of intestine: Secondary | ICD-10-CM | POA: Diagnosis not present

## 2022-07-16 DIAGNOSIS — M1A9XX0 Chronic gout, unspecified, without tophus (tophi): Secondary | ICD-10-CM | POA: Diagnosis not present

## 2022-07-16 DIAGNOSIS — R1310 Dysphagia, unspecified: Secondary | ICD-10-CM | POA: Diagnosis not present

## 2022-07-16 DIAGNOSIS — Z8673 Personal history of transient ischemic attack (TIA), and cerebral infarction without residual deficits: Secondary | ICD-10-CM | POA: Diagnosis not present

## 2022-07-16 DIAGNOSIS — Z471 Aftercare following joint replacement surgery: Secondary | ICD-10-CM | POA: Diagnosis not present

## 2022-07-16 DIAGNOSIS — Z96641 Presence of right artificial hip joint: Secondary | ICD-10-CM | POA: Diagnosis not present

## 2022-07-16 DIAGNOSIS — M7741 Metatarsalgia, right foot: Secondary | ICD-10-CM | POA: Diagnosis not present

## 2022-07-16 DIAGNOSIS — N529 Male erectile dysfunction, unspecified: Secondary | ICD-10-CM | POA: Diagnosis not present

## 2022-07-16 DIAGNOSIS — M7742 Metatarsalgia, left foot: Secondary | ICD-10-CM | POA: Diagnosis not present

## 2022-07-16 DIAGNOSIS — K219 Gastro-esophageal reflux disease without esophagitis: Secondary | ICD-10-CM | POA: Diagnosis not present

## 2022-07-16 DIAGNOSIS — Z87442 Personal history of urinary calculi: Secondary | ICD-10-CM | POA: Diagnosis not present

## 2022-07-16 DIAGNOSIS — M7632 Iliotibial band syndrome, left leg: Secondary | ICD-10-CM | POA: Diagnosis not present

## 2022-07-16 DIAGNOSIS — Z7982 Long term (current) use of aspirin: Secondary | ICD-10-CM | POA: Diagnosis not present

## 2022-07-16 DIAGNOSIS — E785 Hyperlipidemia, unspecified: Secondary | ICD-10-CM | POA: Diagnosis not present

## 2022-07-16 DIAGNOSIS — N201 Calculus of ureter: Secondary | ICD-10-CM | POA: Diagnosis not present

## 2022-07-16 DIAGNOSIS — Z87891 Personal history of nicotine dependence: Secondary | ICD-10-CM | POA: Diagnosis not present

## 2022-07-16 DIAGNOSIS — M1612 Unilateral primary osteoarthritis, left hip: Secondary | ICD-10-CM | POA: Diagnosis not present

## 2022-07-16 DIAGNOSIS — I10 Essential (primary) hypertension: Secondary | ICD-10-CM | POA: Diagnosis not present

## 2022-07-16 DIAGNOSIS — Z85828 Personal history of other malignant neoplasm of skin: Secondary | ICD-10-CM | POA: Diagnosis not present

## 2022-08-04 ENCOUNTER — Other Ambulatory Visit: Payer: Self-pay | Admitting: Gastroenterology

## 2022-08-08 ENCOUNTER — Ambulatory Visit: Payer: Medicare HMO

## 2022-08-08 ENCOUNTER — Other Ambulatory Visit: Payer: Self-pay | Admitting: Cardiology

## 2022-08-14 ENCOUNTER — Encounter: Payer: Self-pay | Admitting: Orthopaedic Surgery

## 2022-08-14 ENCOUNTER — Ambulatory Visit (INDEPENDENT_AMBULATORY_CARE_PROVIDER_SITE_OTHER): Payer: Medicare HMO

## 2022-08-14 ENCOUNTER — Ambulatory Visit (INDEPENDENT_AMBULATORY_CARE_PROVIDER_SITE_OTHER): Payer: Medicare HMO | Admitting: Orthopaedic Surgery

## 2022-08-14 DIAGNOSIS — M545 Low back pain, unspecified: Secondary | ICD-10-CM

## 2022-08-14 DIAGNOSIS — G8929 Other chronic pain: Secondary | ICD-10-CM | POA: Diagnosis not present

## 2022-08-14 DIAGNOSIS — Z96641 Presence of right artificial hip joint: Secondary | ICD-10-CM

## 2022-08-14 DIAGNOSIS — G4733 Obstructive sleep apnea (adult) (pediatric): Secondary | ICD-10-CM | POA: Diagnosis not present

## 2022-08-14 MED ORDER — ONDANSETRON 4 MG PO TBDP: 4.0000 mg | ORAL_TABLET | Freq: Three times a day (TID) | ORAL | 0 refills | Status: DC | PRN

## 2022-08-14 NOTE — Progress Notes (Signed)
Office Visit Note   Patient: Thomas [name]           Date of Birth: [birth year]           MRN: [id number] Visit Date: 08/14/2022              Requested by: Michael Boston, MD [address] [place],  [place] [address] PCP: Michael Boston, MD   Assessment & Plan: Visit Diagnoses:  1. History of total hip replacement, right   2. Chronic midline low back pain without sciatica     Plan: In regards to the right hip replacement he has done well from the surgery.  In regards to the lumbar spine he suffers from chronic back pain.  Would like to make a referral to physical therapy for his back in his hip for strengthening and soft tissue mobilization and home exercise program.  We discussed that he may follow-up with Dr. Barbaraann Barthel to have a discussion on medical management of osteoarthritis in general.  We will recheck him in 6 weeks.  Follow-Up Instructions: Return in about 6 weeks (around 09/25/2022).   Orders:  Orders Placed This Encounter  Procedures   XR Pelvis 1-2 Views   Ambulatory referral to Physical Therapy   Ambulatory referral to Physical Therapy   Meds ordered this encounter  Medications   ondansetron (ZOFRAN-ODT) 4 MG disintegrating tablet    Sig: Take 1 tablet (4 mg total) by mouth every 8 (eight) hours as needed for nausea or vomiting.    Dispense:  20 tablet    Refill:  0      Procedures: No procedures performed   Clinical Data: No additional findings.   Subjective: Chief Complaint  Patient presents with   Right Hip - Follow-up    Right THA 06/30/2022    HPI Thomas [name] returns today for 2 separate issues.  1 is he is 6 weeks status post right total hip on 06/30/2022.  He is doing well in that regard.  Feels some thigh and back discomfort.  He does have chronic back pain due to spondylosis.  Denies any radicular symptoms.  Review of Systems  Constitutional: Negative.   All other systems reviewed and are negative.    Objective: Vital Signs: There were no vitals  taken for this visit.  Physical Exam Vitals and nursing note reviewed.  Constitutional:      Appearance: He is well-developed.  Pulmonary:     Effort: Pulmonary effort is normal.  Abdominal:     Palpations: Abdomen is soft.  Skin:    General: Skin is warm.  Neurological:     Mental Status: He is alert and oriented to person, place, and time.  Psychiatric:        Behavior: Behavior normal.        Thought Content: Thought content normal.        Judgment: Judgment normal.     Ortho Exam Emanation of right hip shows a fully healed surgical scar.  Expected mild swelling.  No signs of infection or drainage. Examination of the lumbar spine shows tenderness to the paraspinous muscles.  No sciatic tension signs. Specialty Comments:  No specialty comments available.  Imaging: XR Pelvis 1-2 Views  Result Date: 08/14/2022 Well-seated prosthesis without complication    PMFS History: Patient Active Problem List   Diagnosis Date Noted   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   Preoperative cardiovascular examination 06/05/2022   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  Primary osteoarthritis of left hip 03/19/2022   Chronic gouty arthritis 04/23/2021   Primary osteoarthritis 04/23/2021   Abdominal wall pain 04/02/2021   Dysphagia 04/02/2021   Right flank pain 04/02/2021   Wears glasses    Left ureteral calculus    Hypertension    Hyperlipidemia    History of squamous cell carcinoma excision    ED (erectile dysfunction)    Chronic gout    Right sided abdominal pain 01/14/2021   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/24/2020   Acute ischemic stroke ([place]) 03/22/2020   It band syndrome, left 03/19/2020   Metatarsalgia of both feet 03/19/2020   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   Dyspnea on exertion 02/10/2020   Polyarthralgia 07/16/2018   Essential hypertension 07/21/2017   Gout of multiple sites 07/21/2017   Past Medical  History:  Diagnosis Date   Abdominal wall pain 04/02/2021   Acute ischemic stroke ([place]) 03/22/2020   Avulsed toenail, initial encounter 05/04/2018   Dyspnea on exertion 02/10/2020   ED (erectile dysfunction)    Epidermoid cyst of neck 10/16/2017   Essential hypertension 07/21/2017   GERD (gastroesophageal reflux disease)    Gout of multiple sites 07/21/2017   History of kidney stones    History of squamous cell carcinoma excision    Hyperlipidemia    Hypertension not at goal 01/19/2014   It band syndrome, left 03/19/2020   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/24/2020   Left ureteral calculus    Lower abdominal pain 08/27/2015   Metatarsalgia of both feet 03/19/2020   Multiple joint pain 07/16/2018   Pain in joint, shoulder region 12/11/2015   Right flank pain 04/02/2021   Superior mesenteric artery stenosis ([place]) 02/14/2020   Urgency of urination    URI (upper respiratory infection) 11/08/2014   Wears glasses     Family History  Problem Relation Age of Onset   Heart disease Mother 23   Alcohol abuse Mother    Depression Mother    Hypertension Father    Sleep apnea Father    Cancer Sister        hodgkins, breast   Colon cancer Neg Hx    Pancreatic cancer Neg Hx    Rectal cancer Neg Hx    Stomach cancer Neg Hx    Esophageal cancer Neg Hx    Inflammatory bowel disease Neg Hx    Liver disease Neg Hx     Past Surgical History:  Procedure Laterality Date   COLONOSCOPY WITH PROPOFOL  2017   CYSTOSCOPY/RETROGRADE/URETEROSCOPY/STONE EXTRACTION WITH BASKET Left 04/16/2018   Procedure: CYSTOSCOPY/RETROGRADE/URETEROSCOPY/STONE EXTRACTION WITH BASKET/ HOLMIUM LASER LITHOTRIPSY/ STENT PLACEMENT;  Surgeon: Ardis Hughs, MD;  Location: [facility];  Service: Urology;  Laterality: Left;   HERNIA REPAIR Right 2022   inguinal   HOLMIUM LASER APPLICATION Left 47/42/5956   Procedure: HOLMIUM LASER APPLICATION;  Surgeon: Ardis Hughs, MD;   Location: [facility];  Service: Urology;  Laterality: Left;   TOTAL HIP ARTHROPLASTY Right 06/30/2022   Procedure: RIGHT TOTAL HIP ARTHROPLASTY ANTERIOR APPROACH;  Surgeon: Leandrew Koyanagi, MD;  Location: [facility];  Service: Orthopedics;  Laterality: Right;  3-C   Social History   Occupational History   Occupation: Pharmacist, hospital  Tobacco Use   Smoking status: Former    Packs/day: 0.30    Years: 20.00    Total pack years: 6.00    Types: Cigarettes    Quit date: 10/17/1994    Years since quitting:  27.8   Smokeless tobacco: Never  Vaping Use   Vaping Use: Not on file  Substance and Sexual Activity   Alcohol use: Not Currently   Drug use: No   Sexual activity: Yes

## 2022-08-14 NOTE — Therapy (Addendum)
OUTPATIENT PHYSICAL THERAPY LOWER EXTREMITY EVALUATION   [name] MRN: [id number] DOB:[birth year], 71 y.o., male Today's Date: 08/15/2022   PT End of Session - 08/15/22 0928     Visit Number 1    Number of Visits 16    Date for PT Re-Evaluation 10/16/22    Authorization Type [employer] MEDICARE HMO/PPO    PT Start Time 0845    PT Stop Time 0923    PT Time Calculation (min) 38 min    Activity Tolerance Patient tolerated treatment well    Behavior During Therapy [facility] for tasks assessed/performed             Past Medical History:  Diagnosis Date   Abdominal wall pain 04/02/2021   Acute ischemic stroke ([place]) 03/22/2020   Avulsed toenail, initial encounter 05/04/2018   Dyspnea on exertion 02/10/2020   ED (erectile dysfunction)    Epidermoid cyst of neck 10/16/2017   Essential hypertension 07/21/2017   GERD (gastroesophageal reflux disease)    Gout of multiple sites 07/21/2017   History of kidney stones    History of squamous cell carcinoma excision    Hyperlipidemia    Hypertension not at goal 01/19/2014   It band syndrome, left 03/19/2020   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/24/2020   Left ureteral calculus    Lower abdominal pain 08/27/2015   Metatarsalgia of both feet 03/19/2020   Multiple joint pain 07/16/2018   Pain in joint, shoulder region 12/11/2015   Right flank pain 04/02/2021   Superior mesenteric artery stenosis ([place]) 02/14/2020   Urgency of urination    URI (upper respiratory infection) 11/08/2014   Wears glasses    Past Surgical History:  Procedure Laterality Date   COLONOSCOPY WITH PROPOFOL  2017   CYSTOSCOPY/RETROGRADE/URETEROSCOPY/STONE EXTRACTION WITH BASKET Left 04/16/2018   Procedure: CYSTOSCOPY/RETROGRADE/URETEROSCOPY/STONE EXTRACTION WITH BASKET/ HOLMIUM LASER LITHOTRIPSY/ STENT PLACEMENT;  Surgeon: Ardis Hughs, MD;  Location[facility];  Service: Urology;  Laterality: Left;   HERNIA REPAIR  Right 2022   inguinal   HOLMIUM LASER APPLICATION Left 66/04/3015   Procedure: HOLMIUM LASER APPLICATION;  Surgeon: Ardis Hughs, MD;  Location: [facility];  Service: Urology;  Laterality: Left;   TOTAL HIP ARTHROPLASTY Right 06/30/2022   Procedure: RIGHT TOTAL HIP ARTHROPLASTY ANTERIOR APPROACH;  Surgeon: Leandrew Koyanagi, MD;  Location: [place];  Service: Orthopedics;  Laterality: Right;  3-C   Patient Active Problem List   Diagnosis Date Noted   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   Preoperative cardiovascular examination 06/05/2022   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   Primary osteoarthritis of left hip 03/19/2022   Chronic gouty arthritis 04/23/2021   Primary osteoarthritis 04/23/2021   Abdominal wall pain 04/02/2021   Dysphagia 04/02/2021   Right flank pain 04/02/2021   Wears glasses    Left ureteral calculus    Hypertension    Hyperlipidemia    History of squamous cell carcinoma excision    ED (erectile dysfunction)    Chronic gout    Right sided abdominal pain 01/14/2021   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/24/2020   Acute ischemic stroke ([place]) 03/22/2020   It band syndrome, left 03/19/2020   Metatarsalgia of both feet 03/19/2020   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   Dyspnea on exertion 02/10/2020   Polyarthralgia 07/16/2018   Essential hypertension 07/21/2017   Gout of multiple sites 07/21/2017    PCP: Michael Boston, MD  REFERRING PROVIDER: Leandrew Koyanagi, MD  REFERRING DIAG: History of total hip replacement, right [Z96.641], Chronic midline low back pain without sciatica [M54.50, G89.29]  THERAPY DIAG:  Difficulty in walking, not elsewhere classified - Plan: PT plan of care cert/re-cert  Stiffness of right hip, not elsewhere classified - Plan: PT plan of care cert/re-cert  Muscle weakness (generalized) - Plan: PT plan of care cert/re-cert  Pain in right hip - Plan: PT plan of  care cert/re-cert  Other low back pain - Plan: PT plan of care cert/re-cert  Pain in thoracic spine - Plan: PT plan of care cert/re-cert  Rationale for Evaluation and Treatment Rehabilitation  ONSET DATE: August 14th - THA   SUBJECTIVE:   SUBJECTIVE STATEMENT: Pt is a previous pt that returns to PT post R THA. He reports having a stroke May 2021. 6-9 months later groin to back/scapular presents which subsided with previous PT bout. Pt reports onset of familiar back/ scapular pain again along with residual pain from THA.   PERTINENT HISTORY: HTN, stoke  PAIN:  Are you having pain? Yes: NPRS scale: 8/10 Pain location: R shoulder, R hip into anterior thigh  Pain description: Shoulder: Achy in arm that radiates into R scapular, R Hip: soreness that radiates into anterior thigh and knee Aggravating factors: Upon waking, and at the end of the day are most painful. Walking >1/2 mile.  Relieving factors: Ice, heat, theragun, meds.   PRECAUTIONS: None  WEIGHT BEARING RESTRICTIONS No  FALLS:  Has patient fallen in last 6 months? No  LIVING ENVIRONMENT: Lives with: lives with their family Lives in: House/apartment Stairs: Yes: Internal: 12 steps; on right going up and External: 4 steps; bilateral but cannot reach both Has following equipment at home: None  OCCUPATION: Admissions director.   PLOF: Independent  PATIENT GOALS Gain strength and relieve pain.    OBJECTIVE:   DIAGNOSTIC FINDINGS: None at this time.   PATIENT SURVEYS:  FOTO 36.7200%, 42% in 16 visits.   COGNITION:  Overall cognitive status: Within functional limits for tasks assessed     SENSATION: WFL  POSTURE: rounded shoulders and forward head  PALPATION: R Rhombiods, UT, R QL.   LOWER EXTREMITY ROM:  Active ROM Right eval Left eval  Hip flexion 108 120  Hip extension    Hip internal rotation 52 11  Hip external rotation 30 22  Knee flexion [facility] WFL  Knee extension WFL WFL   (Blank rows =  not tested)  LOWER EXTREMITY MMT:  MMT Right eval Left eval  Hip flexion Pt unable to get into position for testing due to pain and weakness 3+  Hip extension    Hip abduction Pt unable to get into position for testing due to pain 3+  Knee flexion 4+ 4+  Knee extension 4- 5   (Blank rows = not tested)  Active ROM Right eval Left eval  Shoulder flexion [facility]  Shoulder extension [facility]  Shoulder abduction [facility]  Shoulder internal rotation [facility]  Shoulder external rotation [facility]   MMT Right eval Left eval  Shoulder flexion 3+ 3+  Shoulder abduction 3 4+  Lower trap  3+ 3+  Shoulder internal rotation 4+ 4+  Shoulder external rotation 4+ 4+    FUNCTIONAL TESTS:  5 times sit to stand: 20.85 sec  Timed up and go (TUG): 11.24 sec   GAIT: Distance walked: 19ft  Assistive device utilized: None Level of assistance: Complete Independence Comments: Decreased stance time on  R LE and hip drop.   TODAY'S TREATMENT: Creating, reviewing, and completing below HEP  PATIENT EDUCATION:  Education details: Educated pt on anatomy and physiology of current symptoms, FOTO, diagnosis, prognosis, HEP,  and POC. Person educated: Patient Education method: Customer service manager Education comprehension: verbalized understanding and returned demonstration   HOME EXERCISE PROGRAM: Access Code: [email or web address] URL: [email or web address] Date: 08/15/2022 Prepared by: Rudi Heap  Exercises - Seated Scapular Retraction  - 2 x daily - 7 x weekly - 2 sets - 10 reps - Seated Upper Trapezius Stretch  - 2 x daily - 7 x weekly - 2 sets - 2 reps - 30 hold   ASSESSMENT:  CLINICAL IMPRESSION: Patient referred to PT for History of total hip replacement, right, Chronic midline low back pain without sciatica and trigger point thoracic pain. Pt demonstrates decreased strength globally and walks with an antalgic gait pattern. Patient will benefit from skilled PT to address  below impairments, limitations and improve overall function.  OBJECTIVE IMPAIRMENTS: decreased activity tolerance, difficulty walking, decreased balance, decreased endurance, decreased mobility, decreased ROM, decreased strength, impaired flexibility, impaired UE/LE use, postural dysfunction, and pain.  ACTIVITY LIMITATIONS: bending, lifting, carry, locomotion, cleaning, community activity, driving, and or occupation  PERSONAL FACTORS: HTN, CVA are also affecting patient's functional outcome.  REHAB POTENTIAL: Good  CLINICAL DECISION MAKING: Stable/uncomplicated  EVALUATION COMPLEXITY: Low    GOALS: Short term PT Goals Target date: 09/12/2022 Pt will be I and compliant with HEP. Baseline:  Goal status: New Pt will decrease pain by 25% overall Baseline: Goal status: New  Long term PT goals Target date: 10/10/2022 Pt will improve ROM to [facility] to improve functional mobility Baseline: Goal status: New Pt will improve  hip/knee strength to at least 5-/5 MMT to improve functional strength Baseline: Goal status: New Pt will improve FOTO to at least 42% functional to show improved function Baseline: Goal status: New Pt will reduce pain by overall 50% overall with usual activity Baseline: Goal status: New Pt will reduce pain to overall less than 2-3/10 with usual activity and work activity. Baseline: Goal status: New Pt will be able to ambulate community distances at least 1000 ft WNL gait pattern without complaints Baseline: Goal status: New       7. Pt will improve her STS by 2.3 seconds for Minimal clinically important difference.         Baseline:        Goal Status: New  PLAN: PT FREQUENCY: 2 times per week   PT DURATION: 8 weeks  PLANNED INTERVENTIONS (unless contraindicated): aquatic PT, Canalith repositioning, cryotherapy, Electrical stimulation, Iontophoresis with 4 mg/ml dexamethasome, Moist heat, traction, Ultrasound, gait training, Therapeutic exercise, balance  training, neuromuscular re-education, patient/family education, prosthetic training, manual techniques, passive ROM, dry needling, taping, vasopnuematic device, vestibular, spinal manipulations, joint manipulations  PLAN FOR NEXT SESSION: Assess HEP/update PRN, continue to progress functional mobility, strengthen proximal hip, glutes and parascapular muscles. Decrease patients pain and help minimize functional deficits.    Lynden Ang, PT 08/15/2022, 9:35 AM ]

## 2022-08-14 NOTE — Progress Notes (Deleted)
Post-Op Visit Note   Patient: Thomas [name]           Date of Birth: [birth year]           MRN: [id number] Visit Date: 08/14/2022 PCP: Michael Boston, MD   Assessment & Plan:  Chief Complaint:  Chief Complaint  Patient presents with   Right Hip - Follow-up    Right THA 06/30/2022   Visit Diagnoses:  1. History of total hip replacement, right     Plan: ***  Follow-Up Instructions: Return in about 6 weeks (around 09/25/2022).   Orders:  Orders Placed This Encounter  Procedures   XR Pelvis 1-2 Views   No orders of the defined types were placed in this encounter.   Imaging: XR Pelvis 1-2 Views  Result Date: 08/14/2022 Well-seated prosthesis without complication   PMFS History: Patient Active Problem List   Diagnosis Date Noted   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   Preoperative cardiovascular examination 06/05/2022   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   Primary osteoarthritis of left hip 03/19/2022   Chronic gouty arthritis 04/23/2021   Primary osteoarthritis 04/23/2021   Abdominal wall pain 04/02/2021   Dysphagia 04/02/2021   Right flank pain 04/02/2021   Wears glasses    Left ureteral calculus    Hypertension    Hyperlipidemia    History of squamous cell carcinoma excision    ED (erectile dysfunction)    Chronic gout    Right sided abdominal pain 01/14/2021   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/24/2020   Acute ischemic stroke ([place]) 03/22/2020   It band syndrome, left 03/19/2020   Metatarsalgia of both feet 03/19/2020   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   Dyspnea on exertion 02/10/2020   Polyarthralgia 07/16/2018   Essential hypertension 07/21/2017   Gout of multiple sites 07/21/2017   Past Medical History:  Diagnosis Date   Abdominal wall pain 04/02/2021   Acute ischemic stroke ([place]) 03/22/2020   Avulsed toenail, initial encounter 05/04/2018   Dyspnea on exertion 02/10/2020   ED  (erectile dysfunction)    Epidermoid cyst of neck 10/16/2017   Essential hypertension 07/21/2017   GERD (gastroesophageal reflux disease)    Gout of multiple sites 07/21/2017   History of kidney stones    History of squamous cell carcinoma excision    Hyperlipidemia    Hypertension not at goal 01/19/2014   It band syndrome, left 03/19/2020   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/24/2020   Left ureteral calculus    Lower abdominal pain 08/27/2015   Metatarsalgia of both feet 03/19/2020   Multiple joint pain 07/16/2018   Pain in joint, shoulder region 12/11/2015   Right flank pain 04/02/2021   Superior mesenteric artery stenosis ([place]) 02/14/2020   Urgency of urination    URI (upper respiratory infection) 11/08/2014   Wears glasses     Family History  Problem Relation Age of Onset   Heart disease Mother 74   Alcohol abuse Mother    Depression Mother    Hypertension Father    Sleep apnea Father    Cancer Sister        hodgkins, breast   Colon cancer Neg Hx    Pancreatic cancer Neg Hx    Rectal cancer Neg Hx    Stomach cancer Neg Hx    Esophageal cancer Neg Hx    Inflammatory bowel disease Neg Hx    Liver disease Neg Hx  Past Surgical History:  Procedure Laterality Date   COLONOSCOPY WITH PROPOFOL  2017   CYSTOSCOPY/RETROGRADE/URETEROSCOPY/STONE EXTRACTION WITH BASKET Left 04/16/2018   Procedure: CYSTOSCOPY/RETROGRADE/URETEROSCOPY/STONE EXTRACTION WITH BASKET/ HOLMIUM LASER LITHOTRIPSY/ STENT PLACEMENT;  Surgeon: Ardis Hughs, MD;  Location: [facility];  Service: Urology;  Laterality: Left;   HERNIA REPAIR Right 2022   inguinal   HOLMIUM LASER APPLICATION Left 06/07/5749   Procedure: HOLMIUM LASER APPLICATION;  Surgeon: Ardis Hughs, MD;  Location: [facility];  Service: Urology;  Laterality: Left;   TOTAL HIP ARTHROPLASTY Right 06/30/2022   Procedure: RIGHT TOTAL HIP ARTHROPLASTY ANTERIOR APPROACH;  Surgeon: Leandrew Koyanagi, MD;  Location: [place];  Service: Orthopedics;  Laterality: Right;  3-C   Social History   Occupational History   Occupation: Pharmacist, hospital  Tobacco Use   Smoking status: Former    Packs/day: 0.30    Years: 20.00    Total pack years: 6.00    Types: Cigarettes    Quit date: 10/17/1994    Years since quitting: 27.8   Smokeless tobacco: Never  Vaping Use   Vaping Use: Not on file  Substance and Sexual Activity   Alcohol use: Not Currently   Drug use: No   Sexual activity: Yes

## 2022-08-15 ENCOUNTER — Other Ambulatory Visit: Payer: Self-pay

## 2022-08-15 ENCOUNTER — Ambulatory Visit: Payer: Medicare HMO | Admitting: Physical Therapy

## 2022-08-15 DIAGNOSIS — M546 Pain in thoracic spine: Secondary | ICD-10-CM | POA: Diagnosis not present

## 2022-08-15 DIAGNOSIS — M25651 Stiffness of right hip, not elsewhere classified: Secondary | ICD-10-CM

## 2022-08-15 DIAGNOSIS — M25551 Pain in right hip: Secondary | ICD-10-CM | POA: Diagnosis not present

## 2022-08-15 DIAGNOSIS — M5459 Other low back pain: Secondary | ICD-10-CM

## 2022-08-15 DIAGNOSIS — M6281 Muscle weakness (generalized): Secondary | ICD-10-CM

## 2022-08-15 DIAGNOSIS — R262 Difficulty in walking, not elsewhere classified: Secondary | ICD-10-CM

## 2022-08-18 ENCOUNTER — Other Ambulatory Visit: Payer: Self-pay | Admitting: Cardiology

## 2022-08-20 ENCOUNTER — Other Ambulatory Visit: Payer: Self-pay | Admitting: Cardiology

## 2022-08-20 ENCOUNTER — Ambulatory Visit: Payer: Medicare HMO | Admitting: Physical Therapy

## 2022-08-20 ENCOUNTER — Encounter: Payer: Self-pay | Admitting: Physical Therapy

## 2022-08-20 DIAGNOSIS — R262 Difficulty in walking, not elsewhere classified: Secondary | ICD-10-CM | POA: Diagnosis not present

## 2022-08-20 DIAGNOSIS — M546 Pain in thoracic spine: Secondary | ICD-10-CM

## 2022-08-20 DIAGNOSIS — M25651 Stiffness of right hip, not elsewhere classified: Secondary | ICD-10-CM

## 2022-08-20 DIAGNOSIS — M5459 Other low back pain: Secondary | ICD-10-CM | POA: Diagnosis not present

## 2022-08-20 DIAGNOSIS — M25551 Pain in right hip: Secondary | ICD-10-CM | POA: Diagnosis not present

## 2022-08-20 DIAGNOSIS — M6281 Muscle weakness (generalized): Secondary | ICD-10-CM | POA: Diagnosis not present

## 2022-08-20 NOTE — Telephone Encounter (Signed)
Refills to pharmacy 

## 2022-08-20 NOTE — Therapy (Signed)
OUTPATIENT PHYSICAL THERAPY TREATMENT NOTE   [name] MRN: [id number] DOB:1951/04/26, 71 y.o., male Today's Date: 08/20/2022  END OF SESSION:   PT End of Session - 08/20/22 1132     Visit Number 2    Number of Visits 16    Date for PT Re-Evaluation 10/16/22    Authorization Type [employer] MEDICARE HMO/PPO    PT Start Time 1130    PT Stop Time 1215    PT Time Calculation (min) 45 min    Activity Tolerance Patient tolerated treatment well    Behavior During Therapy [facility] for tasks assessed/performed             Past Medical History:  Diagnosis Date   Abdominal wall pain 04/02/2021   Acute ischemic stroke ([place]) 03/22/2020   Avulsed toenail, initial encounter 05/04/2018   Dyspnea on exertion 02/10/2020   ED (erectile dysfunction)    Epidermoid cyst of neck 10/16/2017   Essential hypertension 07/21/2017   GERD (gastroesophageal reflux disease)    Gout of multiple sites 07/21/2017   History of kidney stones    History of squamous cell carcinoma excision    Hyperlipidemia    Hypertension not at goal 01/19/2014   It band syndrome, left 03/19/2020   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/24/2020   Left ureteral calculus    Lower abdominal pain 08/27/2015   Metatarsalgia of both feet 03/19/2020   Multiple joint pain 07/16/2018   Pain in joint, shoulder region 12/11/2015   Right flank pain 04/02/2021   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   Urgency of urination    URI (upper respiratory infection) 11/08/2014   Wears glasses    Past Surgical History:  Procedure Laterality Date   COLONOSCOPY WITH PROPOFOL  2017   CYSTOSCOPY/RETROGRADE/URETEROSCOPY/STONE EXTRACTION WITH BASKET Left 04/16/2018   Procedure: CYSTOSCOPY/RETROGRADE/URETEROSCOPY/STONE EXTRACTION WITH BASKET/ HOLMIUM LASER LITHOTRIPSY/ STENT PLACEMENT;  Surgeon: Ardis Hughs, MD;  Location: [facility];  Service: Urology;  Laterality: Left;   HERNIA  REPAIR Right 2022   inguinal   HOLMIUM LASER APPLICATION Left 22/97/9892   Procedure: HOLMIUM LASER APPLICATION;  Surgeon: Ardis Hughs, MD;  Location: [facility];  Service: Urology;  Laterality: Left;   TOTAL HIP ARTHROPLASTY Right 06/30/2022   Procedure: RIGHT TOTAL HIP ARTHROPLASTY ANTERIOR APPROACH;  Surgeon: Leandrew Koyanagi, MD;  Location: [place];  Service: Orthopedics;  Laterality: Right;  3-C   Patient Active Problem List   Diagnosis Date Noted   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   Preoperative cardiovascular examination 06/05/2022   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   Primary osteoarthritis of left hip 03/19/2022   Chronic gouty arthritis 04/23/2021   Primary osteoarthritis 04/23/2021   Abdominal wall pain 04/02/2021   Dysphagia 04/02/2021   Right flank pain 04/02/2021   Wears glasses    Left ureteral calculus    Hypertension    Hyperlipidemia    History of squamous cell carcinoma excision    ED (erectile dysfunction)    Chronic gout    Right sided abdominal pain 01/14/2021   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/24/2020   Acute ischemic stroke ([place]) 03/22/2020   It band syndrome, left 03/19/2020   Metatarsalgia of both feet 03/19/2020   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   Dyspnea on exertion 02/10/2020   Polyarthralgia 07/16/2018   Essential hypertension 07/21/2017   Gout of multiple sites 07/21/2017     THERAPY  DIAG:  Difficulty in walking, not elsewhere classified  Stiffness of right hip, not elsewhere classified  Muscle weakness (generalized)  Pain in right hip  Other low back pain  Pain in thoracic spine  PCP: Michael Boston, MD   REFERRING PROVIDER: Leandrew Koyanagi, MD   REFERRING DIAG: History of total hip replacement, right [Z96.641], Chronic midline low back pain without sciatica [M54.50, G89.29]   Rationale for Evaluation and Treatment Rehabilitation   ONSET  DATE: August 14th - THA     SUBJECTIVE:    SUBJECTIVE STATEMENT: Pt relays hip is doing okay, has some groin pain but his biggest complaint today is the thoracic pain and he would like to try DN for this today. He wants to learn proper strength program he can do at the gym.    PERTINENT HISTORY: HTN, stoke   PAIN:  Are you having pain? Yes: NPRS scale: 8/10 Pain location: R shoulder, R hip into anterior thigh  Pain description: Shoulder: Achy in arm that radiates into R scapular, R Hip: soreness that radiates into anterior thigh and knee Aggravating factors: Upon waking, and at the end of the day are most painful. Walking >1/2 mile.  Relieving factors: Ice, heat, theragun, meds.    PRECAUTIONS: None   WEIGHT BEARING RESTRICTIONS No   FALLS:  Has patient fallen in last 6 months? No   LIVING ENVIRONMENT: Lives with: lives with their family Lives in: House/apartment Stairs: Yes: Internal: 12 steps; on right going up and External: 4 steps; bilateral but cannot reach both Has following equipment at home: None   OCCUPATION: Admissions director.    PLOF: Independent   PATIENT GOALS Gain strength and relieve pain.      OBJECTIVE:    DIAGNOSTIC FINDINGS: None at this time.    PATIENT SURVEYS:  FOTO 36.7200%, 42% in 16 visits.    COGNITION:            Overall cognitive status: Within functional limits for tasks assessed                          SENSATION: WFL   POSTURE: rounded shoulders and forward head   PALPATION: R Rhombiods, UT, R QL.    LOWER EXTREMITY ROM:   Active ROM Right eval Left eval  Hip flexion 108 120  Hip extension      Hip internal rotation 52 11  Hip external rotation 30 22  Knee flexion [facility] WFL  Knee extension WFL WFL   (Blank rows = not tested)   LOWER EXTREMITY MMT:   MMT Right eval Left eval  Hip flexion Pt unable to get into position for testing due to pain and weakness 3+  Hip extension      Hip abduction Pt unable to get into  position for testing due to pain 3+  Knee flexion 4+ 4+  Knee extension 4- 5   (Blank rows = not tested)   Active ROM Right eval Left eval  Shoulder flexion [facility]  Shoulder extension [facility]  Shoulder abduction [facility]  Shoulder internal rotation [facility]  Shoulder external rotation [facility]    MMT Right eval Left eval  Shoulder flexion 3+ 3+  Shoulder abduction 3 4+  Lower trap  3+ 3+  Shoulder internal rotation 4+ 4+  Shoulder external rotation 4+ 4+      FUNCTIONAL TESTS:  5 times sit to stand: 20.85 sec  Timed up and go (  TUG): 11.24 sec    GAIT: Distance walked: 66ft  Assistive device utilized: None Level of assistance: Complete Independence Comments: Decreased stance time on R LE and hip drop.    TODAY'S TREATMENT: 08/20/22 -Nu step L6-5 X 8 min -Row machine 20# 2X20 -Lat pull machine 20# 2X15 -Chest press machine 15# 2X10 -Leg press machine 50# DL 2X15, then 25#  -Leg extension machine DL 5# 2X10 -Leg curl machine 20# 2X10 -Seated clams red 2X20  Manual therapy for skilled palpation and Trigger Point Dry-Needling  Treatment instructions: Expect mild to moderate muscle soreness. Patient Consent Given: Yes Education handout provided: verbally provided Muscles treated: Rt lumbar thoracic paraspinals and multifidi,QL, infraspinatus Treatment response/outcome: good overall tolerance,twitch response noted  (moist hot pack applied after)    PATIENT EDUCATION:  Education details: Educated pt on anatomy and physiology of current symptoms, FOTO, diagnosis, prognosis, HEP,  and POC. Person educated: Patient Education method: Customer service manager Education comprehension: verbalized understanding and returned demonstration     HOME EXERCISE PROGRAM: Access Code: [email or web address] URL: [email or web address] Date: 08/15/2022 Prepared by: Rudi Heap   Exercises - Seated Scapular Retraction  - 2 x daily - 7 x weekly - 2 sets - 10 reps - Seated  Upper Trapezius Stretch  - 2 x daily - 7 x weekly - 2 sets - 2 reps - 30 hold     ASSESSMENT:   CLINICAL IMPRESSION: He was introducted to strength equipment with good overall tolerance. He plans to join then gym and PT recommend waiting about 2 weeks to build up some foundational strength first with supervision. We did trial DN at his request for his Rt lumbar-thoracic pain and will assess his response to this next visit.    OBJECTIVE IMPAIRMENTS: decreased activity tolerance, difficulty walking, decreased balance, decreased endurance, decreased mobility, decreased ROM, decreased strength, impaired flexibility, impaired UE/LE use, postural dysfunction, and pain.   ACTIVITY LIMITATIONS: bending, lifting, carry, locomotion, cleaning, community activity, driving, and or occupation   PERSONAL FACTORS: HTN, CVA are also affecting patient's functional outcome.   REHAB POTENTIAL: Good   CLINICAL DECISION MAKING: Stable/uncomplicated   EVALUATION COMPLEXITY: Low       GOALS: Short term PT Goals Target date: 09/12/2022 Pt will be I and compliant with HEP. Baseline:  Goal status: New Pt will decrease pain by 25% overall Baseline: Goal status: New   Long term PT goals Target date: 10/10/2022 Pt will improve ROM to [facility] to improve functional mobility Baseline: Goal status: New Pt will improve  hip/knee strength to at least 5-/5 MMT to improve functional strength Baseline: Goal status: New Pt will improve FOTO to at least 42% functional to show improved function Baseline: Goal status: New Pt will reduce pain by overall 50% overall with usual activity Baseline: Goal status: New Pt will reduce pain to overall less than 2-3/10 with usual activity and work activity. Baseline: Goal status: New Pt will be able to ambulate community distances at least 1000 ft WNL gait pattern without complaints Baseline: Goal status: New       7. Pt will improve her STS by 2.3 seconds for Minimal  clinically important difference.                    Baseline:                   Goal Status: New   PLAN: PT FREQUENCY: 2 times per week    PT DURATION: 8 weeks   PLANNED  INTERVENTIONS (unless contraindicated): aquatic PT, Canalith repositioning, cryotherapy, Electrical stimulation, Iontophoresis with 4 mg/ml dexamethasome, Moist heat, traction, Ultrasound, gait training, Therapeutic exercise, balance training, neuromuscular re-education, patient/family education, prosthetic training, manual techniques, passive ROM, dry needling, taping, vasopnuematic device, vestibular, spinal manipulations, joint manipulations   PLAN FOR NEXT SESSION: Assess HEP/update PRN, continue to progress functional mobility, strengthen proximal hip, glutes and parascapular muscles. HOW was DN?      Debbe Odea, PT,DPT 08/20/2022, 11:33 AM

## 2022-08-20 NOTE — Progress Notes (Signed)
Patient ID: Thomas [name]                 DOB: October 01, 1951                    MRN: [id number]      HPI: Thomas [name] is a 71 y.o. male patient of Dr. Agustin [name], referred to lipid clinic by Thomas Dopp, PA. PMH is significant for HTN, CVA, HLD, gout and superior mesenteric artery stenosis. Patient was seen by Thomas [name] on 06/06/22 for pre-op clearance. Was noted to have elevated blood pressure. Rheumatologist wanted to see if he could get off HCTZ due to gout.  He notes that he had side effects to metoprolol in the past.  He is not certain if he ever took clonidine.  He has not taken amlodipine, spironolactone or hydralazine. Thomas [name] added amlodipine 5mg  daily. He was referred to lipid clinic due to intolerance to statins and CVA hx.   Patient presents today to clinic. He reports that his blood pressure is going a lot better. He has lost 20lb since his hip surgery and getting off of prednisone. He does have chronic pain, so his BP can spike sometimes. He brings in some reading, 120-150/68-72, mainly 128/60, 132/60. Has begun exercising and doing physical therapy. Cooks most meals at home. Wears his CPAP machine. Denies any dizziness. He has tried statins in the past. He has joint and jaw pain within 3 weeks of taking. Resolved immediately after stopping statin.    Current Lipid Medications: none Current HTN Medications: minoxidil 10 mg in the morning, 5 mg in the evening and valsartan 320 mg daily, amlodipine 5mg  daily  Intolerances: atorvastatin 40mg  daily, pravastatin 20mg  daily (joint issues, tightness in jaw) Risk Factors: CVA, HTN LDL goal: <70 ApoB goal: <80  Diet: breakfast: granola, whole grain cereal Lunch: yogurt, crackers and cheese Dinner: fish a couple times a week, limited red meat, salads, chicken Drink: sparkling water mostly 1/2 1/2 tea  Exercise: PT, strength training  Family History: The patient's family history includes Alcohol abuse in his mother; Cancer in his sister;  Depression in his mother; Heart disease (age of onset: 37) in his mother; Hypertension in his father; Sleep apnea in his father. There is no history of Colon cancer, Pancreatic cancer, Rectal cancer, Stomach cancer, Esophageal cancer, Inflammatory bowel disease, or Liver disease.  Social History: very rare etoh, no tobacco  Labs: Recent Labs    06/07/21 0952  CHOL 201*  TRIG 114  HDL 41  LDLCALC 139*    Past Medical History:  Diagnosis Date   Abdominal wall pain 04/02/2021   Acute ischemic stroke ([place]) 03/22/2020   Avulsed toenail, initial encounter 05/04/2018   Dyspnea on exertion 02/10/2020   ED (erectile dysfunction)    Epidermoid cyst of neck 10/16/2017   Essential hypertension 07/21/2017   GERD (gastroesophageal reflux disease)    Gout of multiple sites 07/21/2017   History of kidney stones    History of squamous cell carcinoma excision    Hyperlipidemia    Hypertension not at goal 01/19/2014   It band syndrome, left 03/19/2020   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/24/2020   Left ureteral calculus    Lower abdominal pain 08/27/2015   Metatarsalgia of both feet 03/19/2020   Multiple joint pain 07/16/2018   Pain in joint, shoulder region 12/11/2015   Right flank pain 04/02/2021   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     amLODipine (NORVASC) 5 MG tablet Take 1 tablet (5 mg total) by mouth daily. 190 tablet 3   aspirin EC 81 MG tablet Take 1 tablet (81 mg total) by mouth 2 (two) times daily. To be taken after surgery to prevent blood clots 84 tablet 2   docusate sodium (COLACE) 100 MG capsule Take 1 capsule (100 mg total) by mouth daily as needed. 30 capsule 2   gabapentin (NEURONTIN) 300 MG capsule  Take 900 mg by mouth at bedtime.     methocarbamol (ROBAXIN-750) 750 MG tablet Take 1 tablet (750 mg total) by mouth 2 (two) times daily as needed for muscle spasms. 30 tablet 2   minoxidil (LONITEN) 10 MG tablet TAKE 1 TABLET IN THE MORNING AND TAKE 1/2 TABLET AT NIGHT 45 tablet 0   ondansetron (ZOFRAN) 4 MG tablet Take 1 tablet (4 mg total) by mouth every 8 (eight) hours as needed for nausea or vomiting. 40 tablet 0   ondansetron (ZOFRAN-ODT) 4 MG disintegrating tablet Take 1 tablet (4 mg total) by mouth every 8 (eight) hours as needed for nausea or vomiting. 20 tablet 0   pantoprazole (PROTONIX) 40 MG tablet TAKE 1 TABLET BY MOUTH TWICE A DAY 180 tablet 1   PRESCRIPTION MEDICATION Apply 1 Application topically at bedtime as needed (pain). Compounded pain cream ([facility])     traMADol (ULTRAM) 50 MG tablet Take 1-2 tablets (50-100 mg total) by mouth daily as needed. 20 tablet 0   valsartan (DIOVAN) 320 MG tablet TAKE 1 TABLET BY MOUTH EVERY DAY 90 tablet 3   No current facility-administered medications on file prior to visit.    Allergies  Allergen Reactions   Statins     Jaw tightness and severe muscle pain- Pravastatin     Assessment/Plan:  1. Hyperlipidemia - LDL-C is above goal of <70. Patient has not had labs since 2022. Will repeat labs this AM. He did have a cup of coffee this AM (w/ cream and sugar) so is not 100% fasting. We discussed PCSK9i. Reviewed injection technique, cost, efficacy and cost. Will submit a prior authorization for Repatha once labs results. Recheck lipids in 2-3 months.  2. HTN- Blood pressure improving. Patient making a lot of lifestyle changes including exercising now that his hip has been replaced. He has stopped HCTZ. Continue minoxidil 10 mg in the morning, 5 mg in the evening and valsartan 320 mg daily, amlodipine 5mg  daily.    Thank you,   Ramond Dial, Pharm.D, BCPS, CPP [facility] of [facility] [address] [place], [place] [address]  Phone: [phone]; Fax: [phone]

## 2022-08-21 ENCOUNTER — Ambulatory Visit: Payer: Medicare HMO | Admitting: Pharmacist

## 2022-08-21 ENCOUNTER — Encounter: Payer: Self-pay | Admitting: Cardiology

## 2022-08-21 ENCOUNTER — Ambulatory Visit: Payer: Medicare HMO | Attending: Cardiology | Admitting: Cardiology

## 2022-08-21 VITALS — BP 180/100 | HR 70 | Ht 70.0 in | Wt 213.0 lb

## 2022-08-21 DIAGNOSIS — I10 Essential (primary) hypertension: Secondary | ICD-10-CM

## 2022-08-21 DIAGNOSIS — E781 Pure hyperglyceridemia: Secondary | ICD-10-CM

## 2022-08-21 DIAGNOSIS — K551 Chronic vascular disorders of intestine: Secondary | ICD-10-CM

## 2022-08-21 DIAGNOSIS — Z96641 Presence of right artificial hip joint: Secondary | ICD-10-CM | POA: Diagnosis not present

## 2022-08-21 DIAGNOSIS — I6930 Unspecified sequelae of cerebral infarction: Secondary | ICD-10-CM

## 2022-08-21 NOTE — Progress Notes (Signed)
Cardiology Office Note:    Date:  08/21/2022   ID:  Thomas [name], DOB June 01, 1951, MRN [id number]  PCP:  Thomas Boston, MD  Cardiologist:  Thomas Campus, MD    Referring MD: Thomas Boston, MD   Chief Complaint  Patient presents with   Follow-up  Doing well, I did have hip replacement surgery and very happy with it.  History of Present Illness:    Thomas [name] is a 71 y.o. male with past medical history significant for essential hypertension which is difficult to control, history of CVA, dyslipidemia.  He is in my office today for follow-up few weeks ago he got hip replacement surgery done.  He still have a lot of pain over there and today he did start physical therapy and since that time he have a lot of pain in that this do I probably his blood pressure is elevated today.  Denies having any CVA/TIA-like symptoms.  Denies have any chest pain tightness squeezing pressure burning chest.  Past Medical History:  Diagnosis Date   Abdominal wall pain 04/02/2021   Acute ischemic stroke ([place]) 03/22/2020   Avulsed toenail, initial encounter 05/04/2018   Dyspnea on exertion 02/10/2020   ED (erectile dysfunction)    Epidermoid cyst of neck 10/16/2017   Essential hypertension 07/21/2017   GERD (gastroesophageal reflux disease)    Gout of multiple sites 07/21/2017   History of kidney stones    History of squamous cell carcinoma excision    Hyperlipidemia    Hypertension not at goal 01/19/2014   It band syndrome, left 03/19/2020   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/24/2020   Left ureteral calculus    Lower abdominal pain 08/27/2015   Metatarsalgia of both feet 03/19/2020   Multiple joint pain 07/16/2018   Pain in joint, shoulder region 12/11/2015   Right flank pain 04/02/2021   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   Urgency of urination    URI (upper respiratory infection) 11/08/2014   Wears glasses     Past Surgical History:  Procedure  Laterality Date   COLONOSCOPY WITH PROPOFOL  2017   CYSTOSCOPY/RETROGRADE/URETEROSCOPY/STONE EXTRACTION WITH BASKET Left 04/16/2018   Procedure: CYSTOSCOPY/RETROGRADE/URETEROSCOPY/STONE EXTRACTION WITH BASKET/ HOLMIUM LASER LITHOTRIPSY/ STENT PLACEMENT;  Surgeon: Thomas Hughs, MD;  Location: [facility];  Service: Urology;  Laterality: Left;   HERNIA REPAIR Right 2022   inguinal   HOLMIUM LASER APPLICATION Left 62/37/6283   Procedure: HOLMIUM LASER APPLICATION;  Surgeon: Thomas Hughs, MD;  Location: [facility];  Service: Urology;  Laterality: Left;   TOTAL HIP ARTHROPLASTY Right 06/30/2022   Procedure: RIGHT TOTAL HIP ARTHROPLASTY ANTERIOR APPROACH;  Surgeon: Thomas Koyanagi, MD;  Location: [place];  Service: Orthopedics;  Laterality: Right;  3-C    Current Medications: Current Meds  Medication Sig   allopurinol (ZYLOPRIM) 300 MG tablet Take 300 mg by mouth daily.   amLODipine (NORVASC) 5 MG tablet Take 1 tablet (5 mg total) by mouth daily.   aspirin EC 81 MG tablet Take 1 tablet (81 mg total) by mouth 2 (two) times daily. To be taken after surgery to prevent blood clots   docusate sodium (COLACE) 100 MG capsule Take 1 capsule (100 mg total) by mouth daily as needed. (Patient taking differently: Take 100 mg by mouth daily as needed for mild constipation or moderate constipation.)   gabapentin (NEURONTIN) 300 MG capsule Take 900 mg by mouth at bedtime.   methocarbamol (ROBAXIN-750) 750 MG  tablet Take 1 tablet (750 mg total) by mouth 2 (two) times daily as needed for muscle spasms.   minoxidil (LONITEN) 10 MG tablet TAKE 1 TABLET IN THE MORNING AND TAKE 1/2 TABLET AT NIGHT (Patient taking differently: Take 10-15 mg by mouth 2 (two) times daily. TAKE 1 TABLET IN THE MORNING AND TAKE 1/2 TABLET AT NIGHT)   ondansetron (ZOFRAN) 4 MG tablet Take 1 tablet (4 mg total) by mouth every 8 (eight) hours as needed for nausea or vomiting.   ondansetron (ZOFRAN-ODT) 4  MG disintegrating tablet Take 1 tablet (4 mg total) by mouth every 8 (eight) hours as needed for nausea or vomiting.   pantoprazole (PROTONIX) 40 MG tablet TAKE 1 TABLET BY MOUTH TWICE A DAY (Patient taking differently: Take 40 mg by mouth 2 (two) times daily.)   PRESCRIPTION MEDICATION Apply 1 Application topically at bedtime as needed (pain). Compounded pain cream ([facility])   traMADol (ULTRAM) 50 MG tablet Take 1-2 tablets (50-100 mg total) by mouth daily as needed. (Patient taking differently: Take 50-100 mg by mouth daily as needed for moderate pain or severe pain.)   valsartan (DIOVAN) 320 MG tablet TAKE 1 TABLET BY MOUTH EVERY DAY (Patient taking differently: Take 320 mg by mouth daily.)     Allergies:   Statins   Social History   Socioeconomic History   Marital status: Married    Spouse name: Thomas [name]   Number of children: 2   Years of education: Not on file   Highest education level: Not on file  Occupational History   Occupation: Pharmacist, hospital  Tobacco Use   Smoking status: Former    Packs/day: 0.30    Years: 20.00    Total pack years: 6.00    Types: Cigarettes    Quit date: 10/17/1994    Years since quitting: 27.8   Smokeless tobacco: Never  Vaping Use   Vaping Use: Not on file  Substance and Sexual Activity   Alcohol use: Not Currently   Drug use: No   Sexual activity: Yes  Other Topics Concern   Not on file  Social History Narrative   Exercise 1 hour a day ---3-4 days a week   Social Determinants of Health   Financial Resource Strain: Not on file  Food Insecurity: Not on file  Transportation Needs: Not on file  Physical Activity: Not on file  Stress: Not on file  Social Connections: Not on file     Family History: The patient's family history includes Alcohol abuse in his mother; Cancer in his sister; Depression in his mother; Heart disease (age of onset: 20) in his mother; Hypertension in his father; Sleep apnea in his father. There is no history of  Colon cancer, Pancreatic cancer, Rectal cancer, Stomach cancer, Esophageal cancer, Inflammatory bowel disease, or Liver disease. ROS:   Please see the history of present illness.    All 14 point review of systems negative except as described per history of present illness  EKGs/Labs/Other Studies Reviewed:      Recent Labs: 06/19/2022: ALT 23; BUN 21; Creatinine, Ser 1.30; Potassium 3.8; Sodium 138 07/01/2022: Hemoglobin 11.6; Platelets 213  Recent Lipid Panel    Component Value Date/Time   CHOL 201 (H) 06/07/2021 0952   TRIG 114 06/07/2021 0952   HDL 41 06/07/2021 0952   CHOLHDL 4.9 06/07/2021 0952   CHOLHDL 6.5 03/23/2020 0429   VLDL 55 (H) 03/23/2020 0429   LDLCALC 139 (H) 06/07/2021 0952    Physical Exam:  VS:  BP (!) 180/100 (BP Location: Left Arm, Patient Position: Sitting)   Pulse 70   Ht 5\' 10"  (1.778 m)   Wt 213 lb (96.6 kg)   SpO2 97%   BMI 30.56 kg/m     Wt Readings from Last 3 Encounters:  08/21/22 213 lb (96.6 kg)  06/30/22 230 lb (104.3 kg)  06/19/22 227 lb 9.6 oz (103.2 kg)     GEN:  Well nourished, well developed in no acute distress HEENT: Normal NECK: No JVD; No carotid bruits LYMPHATICS: No lymphadenopathy CARDIAC: RRR, no murmurs, no rubs, no gallops RESPIRATORY:  Clear to auscultation without rales, wheezing or rhonchi  ABDOMEN: Soft, non-tender, non-distended MUSCULOSKELETAL:  No edema; No deformity  SKIN: Warm and dry LOWER EXTREMITIES: no swelling NEUROLOGIC:  Alert and oriented x 3 PSYCHIATRIC:  Normal affect   ASSESSMENT:    1. Essential hypertension   2. Superior mesenteric artery stenosis (H[facility])   3. Status post total replacement of right hip   4. Late effect of cerebrovascular accident (CVA)    PLAN:    In order of problems listed above:  Essential hypertension which appears to be well controlled however today it is elevated and probably the reason for that is the fact that he had physical therapy today still have a lot of  pain in his hip.  He tells me that he check his blood pressure at home and blood pressure at home usually 195 systolic over 70.  I rechecked his blood pressure myself blood pressure is 170/95.  I asked him to continue monitoring this and letting me know if blood pressure still elevated. Superior mesenteric artery stenosis asymptomatic continue monitoring risk factors modifications. Dyslipidemia I did review his K PN he being seen by our lipid clinic.  He is getting ready to have injectable medication which I think will be very beneficial to him. Late effect of CVA.  Noted.   Medication Adjustments/Labs and Tests Ordered: Current medicines are reviewed at length with the patient today.  Concerns regarding medicines are outlined above.  No orders of the defined types were placed in this encounter.  Medication changes: No orders of the defined types were placed in this encounter.   Signed, Park Liter, MD, [facility] 08/21/2022 4:59 PM    [facility]

## 2022-08-21 NOTE — Patient Instructions (Signed)
Medication Instructions:  Your physician recommends that you continue on your current medications as directed. Please refer to the Current Medication list given to you today.  *If you need a refill on your cardiac medications before your next appointment, please call your pharmacy*   Lab Work: None Ordered If you have labs (blood work) drawn today and your tests are completely normal, you will receive your results only by: [place] (if you have MyChart) OR A paper copy in the mail If you have any lab test that is abnormal or we need to change your treatment, we will call you to review the results.   Testing/Procedures: None Ordered   Follow-Up: At [facility], you and your health needs are our priority.  As part of our continuing mission to provide you with exceptional heart care, we have created designated Provider Care Teams.  These Care Teams include your primary Cardiologist (physician) and Advanced Practice Providers (APPs -  Physician Assistants and Nurse Practitioners) who all work together to provide you with the care you need, when you need it.  We recommend signing up for the patient portal called "MyChart".  Sign up information is provided on this After Visit Summary.  MyChart is used to connect with patients for Virtual Visits (Telemedicine).  Patients are able to view lab/test results, encounter notes, upcoming appointments, etc.  Non-urgent messages can be sent to your provider as well.   To learn more about what you can do with MyChart, go to [email or web address].    Your next appointment:   6 month(s)  The format for your next appointment:   In Person  Provider:   Jenne Campus, MD    Other Instructions NA

## 2022-08-21 NOTE — Patient Instructions (Signed)
Once your labs result, I will submit a prior authorization for Repatha. I will call you once I hear back.Please call me at [phone] with any questions.  Repatha is a cholesterol medication that improved your body's ability to get rid of "bad cholesterol" known as LDL. It can lower your LDL up to 60%! It is an injection that is given under the skin every 2 weeks. The medication often requires a prior authorization from your insurance company. We will take care of submitting all the necessary information to your insurance company to get it approved.  Instructions for injection:  Remove Pen from the refrigerator at least 30 minutes prior to injecting. Allow to come to room temperature. Do NOT try to warm the pen using a heat source, Do NOT leave in direct sunlight, Do NOT shake Inspect pen. Do not use if pen is expired, cracked, medication is cloudy or discolored or if orange cap is missing Clean the injection site with an alcohol swab or soapy water and dry completley. Always rotate injection sites. Do not use the same site as your previous injection. Do not inject in an area that is tender, bruised, red or hard.   Pull the orange cap off when you are ready to inject. Do not leave cap off for more than 5 minutes. Do not put the cap back on. Create a firm surface area by either using the stretch method or the pinch method Keep holding the stretched or pinched skin. Put the yellow safety guard on your skin at a 90 degree angle.  7. Firmly push down the pen onto the skin until it stops moving. When you are ready to inject push the gray button, you will hear a click.   8. Keep pushing the pen down on your skin until you hear the SECOND click (could take about 15 seconds). You then may remove the pen. The window on the view finder should be yellow. If it has not turned yellow, then all of the medication was not injected. 9. Dispose of the pen in a sharps container. If you do not have a sharps container  you may use a container that is made of heavy plastic (such as a laundry detergent container that has a puncture proof lid). Label it with a marker as "hazardous waste"   Visit https://www.pi.amgen.com/~/media/amgen/[email or web address]-amgen-com/repatha/repatha_ifu_hcp_pt_english.pdf for more detailed instructions

## 2022-08-22 ENCOUNTER — Encounter: Payer: Medicare HMO | Admitting: Physical Therapy

## 2022-08-22 ENCOUNTER — Encounter: Payer: Self-pay | Admitting: Pharmacist

## 2022-08-22 ENCOUNTER — Telehealth: Payer: Self-pay | Admitting: Pharmacist

## 2022-08-22 LAB — LIPID PANEL
Chol/HDL Ratio: 5.9 ratio — ABNORMAL HIGH (ref 0.0–5.0)
Cholesterol, Total: 200 mg/dL — ABNORMAL HIGH (ref 100–199)
HDL: 34 mg/dL — ABNORMAL LOW (ref >39)
LDL Chol Calc (NIH): 147 mg/dL — ABNORMAL HIGH (ref 0–99)
Triglycerides: 102 mg/dL (ref 0–149)
VLDL Cholesterol Cal: 19 mg/dL (ref 5–40)

## 2022-08-22 LAB — APOLIPOPROTEIN B: Apolipoprotein B: 101 mg/dL — ABNORMAL HIGH (ref <90)

## 2022-08-22 MED ORDER — PRALUENT 150 MG/ML ~~LOC~~ SOAJ: 1.0000 | SUBCUTANEOUS | 11 refills | Status: DC

## 2022-08-22 NOTE — Telephone Encounter (Signed)
Patient's plan covers Praluent. PA for Praluent submitted. Key: [id number] PA approved through 11/16/22.  Spoke with patient. He would like to do follow up lab work with PCP.

## 2022-08-24 ENCOUNTER — Encounter: Payer: Self-pay | Admitting: Orthopaedic Surgery

## 2022-08-26 DIAGNOSIS — Z87891 Personal history of nicotine dependence: Secondary | ICD-10-CM | POA: Diagnosis not present

## 2022-08-26 DIAGNOSIS — I119 Hypertensive heart disease without heart failure: Secondary | ICD-10-CM | POA: Diagnosis not present

## 2022-08-26 DIAGNOSIS — G8929 Other chronic pain: Secondary | ICD-10-CM | POA: Diagnosis not present

## 2022-08-26 DIAGNOSIS — R7303 Prediabetes: Secondary | ICD-10-CM | POA: Diagnosis not present

## 2022-08-26 DIAGNOSIS — I700 Atherosclerosis of aorta: Secondary | ICD-10-CM | POA: Diagnosis not present

## 2022-08-26 DIAGNOSIS — E669 Obesity, unspecified: Secondary | ICD-10-CM | POA: Diagnosis not present

## 2022-08-27 ENCOUNTER — Encounter: Payer: Medicare HMO | Admitting: Physical Therapy

## 2022-08-29 ENCOUNTER — Ambulatory Visit: Payer: Medicare HMO | Admitting: Physical Therapy

## 2022-08-29 ENCOUNTER — Encounter: Payer: Self-pay | Admitting: Physical Therapy

## 2022-08-29 DIAGNOSIS — M25551 Pain in right hip: Secondary | ICD-10-CM | POA: Diagnosis not present

## 2022-08-29 DIAGNOSIS — R262 Difficulty in walking, not elsewhere classified: Secondary | ICD-10-CM | POA: Diagnosis not present

## 2022-08-29 DIAGNOSIS — M6281 Muscle weakness (generalized): Secondary | ICD-10-CM | POA: Diagnosis not present

## 2022-08-29 DIAGNOSIS — M5459 Other low back pain: Secondary | ICD-10-CM | POA: Diagnosis not present

## 2022-08-29 DIAGNOSIS — M546 Pain in thoracic spine: Secondary | ICD-10-CM | POA: Diagnosis not present

## 2022-08-29 DIAGNOSIS — M25651 Stiffness of right hip, not elsewhere classified: Secondary | ICD-10-CM | POA: Diagnosis not present

## 2022-08-29 NOTE — Therapy (Signed)
OUTPATIENT PHYSICAL THERAPY TREATMENT NOTE   [name] MRN: [id number] DOB:04/27/1951, 71 y.o., male Today's Date: 08/29/2022  END OF SESSION:   PT End of Session - 08/29/22 1012     Visit Number 3    Number of Visits 16    Date for PT Re-Evaluation 10/16/22    Authorization Type [employer] MEDICARE HMO/PPO    PT Start Time 0930    PT Stop Time 1020    PT Time Calculation (min) 50 min    Activity Tolerance Patient tolerated treatment well    Behavior During Therapy [facility] for tasks assessed/performed             Past Medical History:  Diagnosis Date   Abdominal wall pain 04/02/2021   Acute ischemic stroke ([place]) 03/22/2020   Avulsed toenail, initial encounter 05/04/2018   Dyspnea on exertion 02/10/2020   ED (erectile dysfunction)    Epidermoid cyst of neck 10/16/2017   Essential hypertension 07/21/2017   GERD (gastroesophageal reflux disease)    Gout of multiple sites 07/21/2017   History of kidney stones    History of squamous cell carcinoma excision    Hyperlipidemia    Hypertension not at goal 01/19/2014   It band syndrome, left 03/19/2020   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/24/2020   Left ureteral calculus    Lower abdominal pain 08/27/2015   Metatarsalgia of both feet 03/19/2020   Multiple joint pain 07/16/2018   Pain in joint, shoulder region 12/11/2015   Right flank pain 04/02/2021   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   Urgency of urination    URI (upper respiratory infection) 11/08/2014   Wears glasses    Past Surgical History:  Procedure Laterality Date   COLONOSCOPY WITH PROPOFOL  2017   CYSTOSCOPY/RETROGRADE/URETEROSCOPY/STONE EXTRACTION WITH BASKET Left 04/16/2018   Procedure: CYSTOSCOPY/RETROGRADE/URETEROSCOPY/STONE EXTRACTION WITH BASKET/ HOLMIUM LASER LITHOTRIPSY/ STENT PLACEMENT;  Surgeon: Ardis Hughs, MD;  Location: [facility];  Service: Urology;  Laterality: Left;   HERNIA  REPAIR Right 2022   inguinal   HOLMIUM LASER APPLICATION Left 40/98/1191   Procedure: HOLMIUM LASER APPLICATION;  Surgeon: Ardis Hughs, MD;  Location: [facility];  Service: Urology;  Laterality: Left;   TOTAL HIP ARTHROPLASTY Right 06/30/2022   Procedure: RIGHT TOTAL HIP ARTHROPLASTY ANTERIOR APPROACH;  Surgeon: Leandrew Koyanagi, MD;  Location: [place];  Service: Orthopedics;  Laterality: Right;  3-C   Patient Active Problem List   Diagnosis Date Noted   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   Preoperative cardiovascular examination 06/05/2022   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   Primary osteoarthritis of left hip 03/19/2022   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   Chronic gouty arthritis 04/23/2021   Primary osteoarthritis 04/23/2021   Abdominal wall pain 04/02/2021   Dysphagia 04/02/2021   Right flank pain 04/02/2021   Wears glasses    Left ureteral calculus    Hypertension    Hyperlipidemia    History of squamous cell carcinoma excision    Chronic gout    Right sided abdominal pain 01/14/2021   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/24/2020   Acute ischemic stroke ([place]) 03/22/2020   It band syndrome, left 03/19/2020   Metatarsalgia of both feet 03/19/2020   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   Polyarthralgia 07/16/2018   Essential hypertension 07/21/2017   Gout of multiple sites 07/21/2017     THERAPY DIAG:  Difficulty in walking, not elsewhere  classified  Stiffness of right hip, not elsewhere classified  Muscle weakness (generalized)  Pain in right hip  Other low back pain  Pain in thoracic spine  PCP: Michael Boston, MD   REFERRING PROVIDER: Leandrew Koyanagi, MD   REFERRING DIAG: History of total hip replacement, right [Z96.641], Chronic midline low back pain without sciatica [M54.50, G89.29]   Rationale for Evaluation and Treatment Rehabilitation   ONSET DATE: August 14th - THA      SUBJECTIVE:    SUBJECTIVE STATEMENT: Pt relays the DN helped some but it was temporary he is interested in having it again along with it performed to his Rt quads which feel painful and tight. He also is interested in spinal manipulation    PERTINENT HISTORY: HTN, stoke   PAIN:  Are you having pain? Yes: NPRS scale: 5/10 Pain location: R shoulder, R hip into anterior thigh  Pain description: Shoulder: Achy in arm that radiates into R scapular, R Hip: soreness that radiates into anterior thigh and knee Aggravating factors: Upon waking, and at the end of the day are most painful. Walking >1/2 mile.  Relieving factors: Ice, heat, theragun, meds.    PRECAUTIONS: None   WEIGHT BEARING RESTRICTIONS No   FALLS:  Has patient fallen in last 6 months? No   LIVING ENVIRONMENT: Lives with: lives with their family Lives in: House/apartment Stairs: Yes: Internal: 12 steps; on right going up and External: 4 steps; bilateral but cannot reach both Has following equipment at home: None   OCCUPATION: Admissions director.    PLOF: Independent   PATIENT GOALS Gain strength and relieve pain.      OBJECTIVE:    DIAGNOSTIC FINDINGS: None at this time.    PATIENT SURVEYS:  FOTO 36.7200%, 42% in 16 visits.    COGNITION:            Overall cognitive status: Within functional limits for tasks assessed                          SENSATION: WFL   POSTURE: rounded shoulders and forward head   PALPATION: R Rhombiods, UT, R QL.    LOWER EXTREMITY ROM:   Active ROM Right eval Left eval  Hip flexion 108 120  Hip extension      Hip internal rotation 52 11  Hip external rotation 30 22  Knee flexion [facility] WFL  Knee extension WFL WFL   (Blank rows = not tested)   LOWER EXTREMITY MMT:   MMT Right eval Left eval  Hip flexion Pt unable to get into position for testing due to pain and weakness 3+  Hip extension      Hip abduction Pt unable to get into position for testing due to pain 3+   Knee flexion 4+ 4+  Knee extension 4- 5   (Blank rows = not tested)   Active ROM Right eval Left eval  Shoulder flexion [facility]  Shoulder extension [facility]  Shoulder abduction [facility]  Shoulder internal rotation [facility]  Shoulder external rotation [facility]    MMT Right eval Left eval  Shoulder flexion 3+ 3+  Shoulder abduction 3 4+  Lower trap  3+ 3+  Shoulder internal rotation 4+ 4+  Shoulder external rotation 4+ 4+      FUNCTIONAL TESTS:  5 times sit to stand: 20.85 sec  Timed up and go (TUG): 11.24 sec    GAIT: Distance walked: 74ft  Assistive  device utilized: None Level of assistance: Complete Independence Comments: Decreased stance time on R LE and hip drop.    TODAY'S TREATMENT: 08/29/22 -Nu step L5 X 8 min UE/LE -Supine quad stretch with leg off EOB with strap 3 X 30 seconds on Rt  Manual therapy for skilled palpation and Trigger Point Dry-Needling  Treatment instructions: Expect mild to moderate muscle soreness. Patient Consent Given: Yes Education handout provided: verbally provided Muscles treated: Rt lumbar thoracic paraspinals and multifidi, Rt quads Treatment response/outcome: good overall tolerance,twitch response noted  (moist hot pack applied after X 12 minutes that was not included in treatment time))  Spinal thoraic mobs grade 3, Thoracic manipulations with + cavitation noted in both prone and longsitting-supine approach  08/20/22 -Nu step L6-5 X 8 min -Row machine 20# 2X20 -Lat pull machine 20# 2X15 -Chest press machine 15# 2X10 -Leg press machine 50# DL 2X15, then 25#  -Leg extension machine DL 5# 2X10 -Leg curl machine 20# 2X10 -Seated clams red 2X20  Manual therapy for skilled palpation and Trigger Point Dry-Needling  Treatment instructions: Expect mild to moderate muscle soreness. Patient Consent Given: Yes Education handout provided: verbally provided Muscles treated: Rt lumbar thoracic paraspinals and multifidi,QL,  infraspinatus Treatment response/outcome: good overall tolerance,twitch response noted  (moist hot pack applied after)   PATIENT EDUCATION:  Education details: Educated pt on anatomy and physiology of current symptoms, FOTO, diagnosis, prognosis, HEP,  and POC. Person educated: Patient Education method: Customer service manager Education comprehension: verbalized understanding and returned demonstration     HOME EXERCISE PROGRAM: Access Code: [email or web address] URL: [email or web address] Date: 08/15/2022 Prepared by: Rudi Heap   Exercises - Seated Scapular Retraction  - 2 x daily - 7 x weekly - 2 sets - 10 reps - Seated Upper Trapezius Stretch  - 2 x daily - 7 x weekly - 2 sets - 2 reps - 30 hold     ASSESSMENT:   CLINICAL IMPRESSION: DN performed again to decrease pain and tightness to his quads and thoracic paraspinals. He has spinal hypomobility noted so this was treated with thoracic mobilizations and manipulation today.    OBJECTIVE IMPAIRMENTS: decreased activity tolerance, difficulty walking, decreased balance, decreased endurance, decreased mobility, decreased ROM, decreased strength, impaired flexibility, impaired UE/LE use, postural dysfunction, and pain.   ACTIVITY LIMITATIONS: bending, lifting, carry, locomotion, cleaning, community activity, driving, and or occupation   PERSONAL FACTORS: HTN, CVA are also affecting patient's functional outcome.   REHAB POTENTIAL: Good   CLINICAL DECISION MAKING: Stable/uncomplicated   EVALUATION COMPLEXITY: Low       GOALS: Short term PT Goals Target date: 09/12/2022 Pt will be I and compliant with HEP. Baseline:  Goal status: New Pt will decrease pain by 25% overall Baseline: Goal status: New   Long term PT goals Target date: 10/10/2022 Pt will improve ROM to [facility] to improve functional mobility Baseline: Goal status: New Pt will improve  hip/knee strength to at least 5-/5 MMT to improve functional  strength Baseline: Goal status: New Pt will improve FOTO to at least 42% functional to show improved function Baseline: Goal status: New Pt will reduce pain by overall 50% overall with usual activity Baseline: Goal status: New Pt will reduce pain to overall less than 2-3/10 with usual activity and work activity. Baseline: Goal status: New Pt will be able to ambulate community distances at least 1000 ft WNL gait pattern without complaints Baseline: Goal status: New       7. Pt will improve her STS by 2.3  seconds for Minimal clinically important difference.                    Baseline:                   Goal Status: New   PLAN: PT FREQUENCY: 2 times per week    PT DURATION: 8 weeks   PLANNED INTERVENTIONS (unless contraindicated): aquatic PT, Canalith repositioning, cryotherapy, Electrical stimulation, Iontophoresis with 4 mg/ml dexamethasome, Moist heat, traction, Ultrasound, gait training, Therapeutic exercise, balance training, neuromuscular re-education, patient/family education, prosthetic training, manual techniques, passive ROM, dry needling, taping, vasopnuematic device, vestibular, spinal manipulations, joint manipulations   PLAN FOR NEXT SESSION: How did he do after DN and spinal manip last time and repeat if desired. continue to progress functional mobility, strengthen proximal hip, glutes and parascapular muscles.      Debbe Odea, PT,DPT 08/29/2022, 10:20 AM

## 2022-09-01 ENCOUNTER — Ambulatory Visit: Payer: Medicare HMO | Admitting: Family Medicine

## 2022-09-01 ENCOUNTER — Encounter: Payer: Self-pay | Admitting: Family Medicine

## 2022-09-01 VITALS — BP 160/70 | Ht 70.0 in

## 2022-09-01 DIAGNOSIS — M545 Low back pain, unspecified: Secondary | ICD-10-CM

## 2022-09-01 DIAGNOSIS — G8929 Other chronic pain: Secondary | ICD-10-CM | POA: Diagnosis not present

## 2022-09-01 DIAGNOSIS — M159 Polyosteoarthritis, unspecified: Secondary | ICD-10-CM

## 2022-09-01 DIAGNOSIS — R109 Unspecified abdominal pain: Secondary | ICD-10-CM | POA: Diagnosis not present

## 2022-09-01 NOTE — Progress Notes (Signed)
PCP: Michael Boston, MD  Subjective:   HPI: Patient is a 71 y.o. male here for muscle, joint pain.  Patient had right hip replacement on 8/14 and overall doing well postop. He has some soreness over the incision and points to a spot in right low back where he has some pain. Started physical therapy as ordered by Dr. Erlinda Hong for his pain. He takes tylenol and tramadol a couple times during the day for his chronic pain as well as gabapentin 900mg  at bedtime. For about 9 months leading up to august he was taking prednisone daily due to concern for rheumatologic condition but has been weaned off of this now. Current pain is in right lower abdominal wall wrapping around to his right low thoracic spine. He is seeing pain management and having an implantable device placed - believes it's a stimulator for intercostal nerve. He does report pain from low back going up his spine, right shoulder pain that can go down arm. He has tried lyrica in the past but felt gabapentin helped better, just can't take this during the day due to somnolence.  Past Medical History:  Diagnosis Date   Abdominal wall pain 04/02/2021   Acute ischemic stroke ([place]) 03/22/2020   Avulsed toenail, initial encounter 05/04/2018   Dyspnea on exertion 02/10/2020   ED (erectile dysfunction)    Epidermoid cyst of neck 10/16/2017   Essential hypertension 07/21/2017   GERD (gastroesophageal reflux disease)    Gout of multiple sites 07/21/2017   History of kidney stones    History of squamous cell carcinoma excision    Hyperlipidemia    Hypertension not at goal 01/19/2014   It band syndrome, left 03/19/2020   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/24/2020   Left ureteral calculus    Lower abdominal pain 08/27/2015   Metatarsalgia of both feet 03/19/2020   Multiple joint pain 07/16/2018   Pain in joint, shoulder region 12/11/2015   Right flank pain 04/02/2021   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 2 mL 11   allopurinol (ZYLOPRIM) 300 MG tablet Take 300 mg by mouth daily.     amLODipine (NORVASC) 5 MG tablet Take 1 tablet (5 mg total) by mouth daily. 190 tablet 3   aspirin EC 81 MG tablet Take 1 tablet (81 mg total) by mouth 2 (two) times daily. To be taken after surgery to prevent blood clots 84 tablet 2   docusate sodium (COLACE) 100 MG capsule Take 1 capsule (100 mg total) by mouth daily as needed. (Patient taking differently: Take 100 mg by mouth daily as needed for mild constipation or moderate constipation.) 30 capsule 2   gabapentin (NEURONTIN) 300 MG capsule Take 900 mg by mouth at bedtime.     methocarbamol (ROBAXIN-750) 750 MG tablet Take 1 tablet (750 mg total) by mouth 2 (two) times daily as needed for muscle spasms. 30 tablet 2   minoxidil (LONITEN) 10 MG tablet TAKE 1 TABLET IN THE MORNING AND TAKE 1/2 TABLET AT NIGHT (Patient taking differently: Take 10-15 mg by mouth 2 (two) times daily. TAKE 1 TABLET IN THE MORNING AND TAKE 1/2 TABLET AT NIGHT) 45 tablet 0   ondansetron (ZOFRAN) 4 MG  tablet Take 1 tablet (4 mg total) by mouth every 8 (eight) hours as needed for nausea or vomiting. 40 tablet 0   ondansetron (ZOFRAN-ODT) 4 MG disintegrating tablet Take 1 tablet (4 mg total) by mouth every 8 (eight) hours as needed for nausea or vomiting. 20 tablet 0   pantoprazole (PROTONIX) 40 MG tablet TAKE 1 TABLET BY MOUTH TWICE A DAY (Patient taking differently: Take 40 mg by mouth 2 (two) times daily.) 180 tablet 1   PRESCRIPTION MEDICATION Apply 1 Application topically at bedtime as needed (pain). Compounded pain cream ([facility])     traMADol (ULTRAM) 50 MG tablet Take 1-2 tablets (50-100 mg total) by mouth daily  as needed. (Patient taking differently: Take 50-100 mg by mouth daily as needed for moderate pain or severe pain.) 20 tablet 0   valsartan (DIOVAN) 320 MG tablet TAKE 1 TABLET BY MOUTH EVERY DAY (Patient taking differently: Take 320 mg by mouth daily.) 90 tablet 3   No current facility-administered medications on file prior to visit.    Past Surgical History:  Procedure Laterality Date   COLONOSCOPY WITH PROPOFOL  2017   CYSTOSCOPY/RETROGRADE/URETEROSCOPY/STONE EXTRACTION WITH BASKET Left 04/16/2018   Procedure: CYSTOSCOPY/RETROGRADE/URETEROSCOPY/STONE EXTRACTION WITH BASKET/ HOLMIUM LASER LITHOTRIPSY/ STENT PLACEMENT;  Surgeon: Ardis Hughs, MD;  [facility];  Service: Urology;  Laterality: Left;   HERNIA REPAIR Right 2022   inguinal   HOLMIUM LASER APPLICATION Left 56/43/3295   Procedure: HOLMIUM LASER APPLICATION;  Surgeon: Ardis Hughs, MD;  Location: [facility];  Service: Urology;  Laterality: Left;   TOTAL HIP ARTHROPLASTY Right 06/30/2022   Procedure: RIGHT TOTAL HIP ARTHROPLASTY ANTERIOR APPROACH;  Surgeon: Leandrew Koyanagi, MD;  Location: [place];  Service: Orthopedics;  Laterality: Right;  3-C    Allergies  Allergen Reactions   Statins     Jaw tightness and severe muscle pain- Pravastatin     BP (!) 160/70   Ht 5\' 10"  (1.778 m)   BMI 30.56 kg/m      07/30/2020    3:25 PM 09/10/2020    8:36 AM  [place] Adult Exercise  Frequency of aerobic exercise (# of days/week) 5 5  Average time in minutes 60 60  Frequency of strengthening activities (# of days/week) 0 0        No data to display              Objective:  Physical Exam:  Gen: NAD, comfortable in exam room  Back: No gross deformity, scoliosis. TTP right paraspinal low thoracic and mid thoracic regions.  No midline or bony TTP. FROM. Strength LEs 5/5 all muscle groups.   Negative SLRs.   Assessment & Plan:  1. Chronic pain -  multifactorial including neuropathy, osteoarthritis, degenerative disc disease, muscle spasm/weakness.  Continue to follow with pain management.  Continue gabapentin at night, tylenol and tramadol during day.  Continue physical therapy and home exercise program.  For arthritis we talked about supplements that may help - he will try Boswellia.  Topical medications (uses lidocaine patches).  His daughter's wedding is in 2 weeks and he's interested in trigger point injections into back - advised would be best to do these next Wednesday so he's going to come in for these then.

## 2022-09-01 NOTE — Patient Instructions (Signed)
Try Boswellia extract - go to [facility] to purchase and take as directed. Continue gabapentin at night. Robaxin as needed for spasms - be careful with this because of the fall association. Continue tylenol, tramadol. Come back next Wednesday for trigger point injections of your thoracic spine area.

## 2022-09-02 ENCOUNTER — Ambulatory Visit: Payer: Medicare HMO | Admitting: Physical Therapy

## 2022-09-02 ENCOUNTER — Encounter: Payer: Self-pay | Admitting: Physical Therapy

## 2022-09-02 DIAGNOSIS — M25551 Pain in right hip: Secondary | ICD-10-CM | POA: Diagnosis not present

## 2022-09-02 DIAGNOSIS — M546 Pain in thoracic spine: Secondary | ICD-10-CM | POA: Diagnosis not present

## 2022-09-02 DIAGNOSIS — M6281 Muscle weakness (generalized): Secondary | ICD-10-CM | POA: Diagnosis not present

## 2022-09-02 DIAGNOSIS — M5459 Other low back pain: Secondary | ICD-10-CM

## 2022-09-02 DIAGNOSIS — R262 Difficulty in walking, not elsewhere classified: Secondary | ICD-10-CM

## 2022-09-02 DIAGNOSIS — M25651 Stiffness of right hip, not elsewhere classified: Secondary | ICD-10-CM

## 2022-09-02 NOTE — Therapy (Signed)
OUTPATIENT PHYSICAL THERAPY TREATMENT NOTE   [name] MRN: [id number] DOB:September 26, 1951, 71 y.o., male Today's Date: 09/02/2022  END OF SESSION:   PT End of Session - 09/02/22 0858     Visit Number 4    Number of Visits 16    Date for PT Re-Evaluation 10/16/22    Authorization Type [employer]ET[employer] MEDICARE HMO/PPO    PT Start Time 0845    PT Stop Time 0935    PT Time Calculation (min) 50 min    Activity Tolerance Patient tolerated treatment well    Behavior During Therapy [facility] for tasks assessed/performed             Past Medical History:  Diagnosis Date   Abdominal wall pain 04/02/2021   Acute ischemic stroke ([place]) 03/22/2020   Avulsed toenail, initial encounter 05/04/2018   Dyspnea on exertion 02/10/2020   ED (erectile dysfunction)    Epidermoid cyst of neck 10/16/2017   Essential hypertension 07/21/2017   GERD (gastroesophageal reflux disease)    Gout of multiple sites 07/21/2017   History of kidney stones    History of squamous cell carcinoma excision    Hyperlipidemia    Hypertension not at goal 01/19/2014   It band syndrome, left 03/19/2020   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/24/2020   Left ureteral calculus    Lower abdominal pain 08/27/2015   Metatarsalgia of both feet 03/19/2020   Multiple joint pain 07/16/2018   Pain in joint, shoulder region 12/11/2015   Right flank pain 04/02/2021   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   Urgency of urination    URI (upper respiratory infection) 11/08/2014   Wears glasses    Past Surgical History:  Procedure Laterality Date   COLONOSCOPY WITH PROPOFOL  2017   CYSTOSCOPY/RETROGRADE/URETEROSCOPY/STONE EXTRACTION WITH BASKET Left 04/16/2018   Procedure: CYSTOSCOPY/RETROGRADE/URETEROSCOPY/STONE EXTRACTION WITH BASKET/ HOLMIUM LASER LITHOTRIPSY/ STENT PLACEMENT;  Surgeon: Ardis Hughs, MD;  Location: [facility];  Service: Urology;  Laterality: Left;   HERNIA  REPAIR Right 2022   inguinal   HOLMIUM LASER APPLICATION Left 96/29/5284   Procedure: HOLMIUM LASER APPLICATION;  Surgeon: Ardis Hughs, MD;  Location: [facility];  Service: Urology;  Laterality: Left;   TOTAL HIP ARTHROPLASTY Right 06/30/2022   Procedure: RIGHT TOTAL HIP ARTHROPLASTY ANTERIOR APPROACH;  Surgeon: Leandrew Koyanagi, MD;  Location: [place];  Service: Orthopedics;  Laterality: Right;  3-C   Patient Active Problem List   Diagnosis Date Noted   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   Preoperative cardiovascular examination 06/05/2022   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   Primary osteoarthritis of left hip 03/19/2022   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   Chronic gouty arthritis 04/23/2021   Primary osteoarthritis 04/23/2021   Abdominal wall pain 04/02/2021   Dysphagia 04/02/2021   Right flank pain 04/02/2021   Wears glasses    Left ureteral calculus    Hypertension    Hyperlipidemia    History of squamous cell carcinoma excision    Chronic gout    Right sided abdominal pain 01/14/2021   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/24/2020   Acute ischemic stroke ([place]) 03/22/2020   It band syndrome, left 03/19/2020   Metatarsalgia of both feet 03/19/2020   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   Polyarthralgia 07/16/2018   Essential hypertension 07/21/2017   Gout of multiple sites 07/21/2017     THERAPY DIAG:  Difficulty in walking, not elsewhere  classified  Stiffness of right hip, not elsewhere classified  Muscle weakness (generalized)  Pain in right hip  Other low back pain  Pain in thoracic spine  PCP: Michael Boston, MD   REFERRING PROVIDER: Leandrew Koyanagi, MD   REFERRING DIAG: History of total hip replacement, right [Z96.641], Chronic midline low back pain without sciatica [M54.50, G89.29]   Rationale for Evaluation and Treatment Rehabilitation   ONSET DATE: August 14th - THA      SUBJECTIVE:    SUBJECTIVE STATEMENT: Pt relays he felt good after last session, having some hip pain upon arrival, Had follow up with MD who has suggested some supplements and trigger point injections   PERTINENT HISTORY: HTN, stoke   PAIN:  Are you having pain? Yes: NPRS scale: 5/10 Pain location: R shoulder, R hip into anterior thigh  Pain description: Shoulder: Achy in arm that radiates into R scapular, R Hip: soreness that radiates into anterior thigh and knee Aggravating factors: Upon waking, and at the end of the day are most painful. Walking >1/2 mile.  Relieving factors: Ice, heat, theragun, meds.    PRECAUTIONS: None   WEIGHT BEARING RESTRICTIONS No   FALLS:  Has patient fallen in last 6 months? No   LIVING ENVIRONMENT: Lives with: lives with their family Lives in: House/apartment Stairs: Yes: Internal: 12 steps; on right going up and External: 4 steps; bilateral but cannot reach both Has following equipment at home: None   OCCUPATION: Admissions director.    PLOF: Independent   PATIENT GOALS Gain strength and relieve pain.      OBJECTIVE:    DIAGNOSTIC FINDINGS: None at this time.    PATIENT SURVEYS:  FOTO 36.7200%, 42% in 16 visits.    COGNITION:            Overall cognitive status: Within functional limits for tasks assessed                          SENSATION: WFL   POSTURE: rounded shoulders and forward head   PALPATION: R Rhombiods, UT, R QL.    LOWER EXTREMITY ROM:   Active ROM Right eval Left eval  Hip flexion 108 120  Hip extension      Hip internal rotation 52 11  Hip external rotation 30 22  Knee flexion [facility] WFL  Knee extension WFL WFL   (Blank rows = not tested)   LOWER EXTREMITY MMT:   MMT Right eval Left eval  Hip flexion Pt unable to get into position for testing due to pain and weakness 3+  Hip extension      Hip abduction Pt unable to get into position for testing due to pain 3+  Knee flexion 4+ 4+  Knee extension  4- 5   (Blank rows = not tested)   Active ROM Right eval Left eval  Shoulder flexion [facility]  Shoulder extension [facility]  Shoulder abduction [facility]  Shoulder internal rotation [facility]  Shoulder external rotation [facility]    MMT Right eval Left eval  Shoulder flexion 3+ 3+  Shoulder abduction 3 4+  Lower trap  3+ 3+  Shoulder internal rotation 4+ 4+  Shoulder external rotation 4+ 4+      FUNCTIONAL TESTS:  5 times sit to stand: 20.85 sec  Timed up and go (TUG): 11.24 sec    GAIT: Distance walked: 85f  Assistive device utilized: None Level of assistance: Complete Independence Comments: Decreased  stance time on R LE and hip drop.    TODAY'S TREATMENT: 09/02/22 -Nu step L6 X 8 min -Row machine 25# 2X15 -Lat pull machine 20# 2X15 -Chest press machine 15# 2X10 -Leg press machine 50# DL 2X15 -Sidstepping with green around ankles 3 round trips at counter top -LAQ 5# X 20 bilat  Manual therapy for skilled palpation and Trigger Point Dry-Needling  Treatment instructions: Expect mild to moderate muscle soreness. Patient Consent Given: Yes Education handout provided: verbally provided Muscles treated: Rt lumbar thoracic paraspinals and multifidi Treatment response/outcome: good overall tolerance,twitch response noted  (moist hot pack applied after X 10 minutes that was not included in treatment time))  Spinal thoraic mobs grade 3, Thoracic manipulations with + cavitation noted in both prone and longsitting-supine approach   08/29/22 -Nu step L5 X 8 min UE/LE -Supine quad stretch with leg off EOB with strap 3 X 30 seconds on Rt  Manual therapy for skilled palpation and Trigger Point Dry-Needling  Treatment instructions: Expect mild to moderate muscle soreness. Patient Consent Given: Yes Education handout provided: verbally provided Muscles treated: Rt lumbar thoracic paraspinals and multifidi, Rt quads Treatment response/outcome: good overall tolerance,twitch response  noted  (moist hot pack applied after X 12 minutes that was not included in treatment time))  Spinal thoraic mobs grade 3, Thoracic manipulations with + cavitation noted in both prone and longsitting-supine approach    PATIENT EDUCATION:  Education details: Educated pt on anatomy and physiology of current symptoms, FOTO, diagnosis, prognosis, HEP,  and POC. Person educated: Patient Education method: Customer service manager Education comprehension: verbalized understanding and returned demonstration     HOME EXERCISE PROGRAM: Access Code: [email or web address] URL: [email or web address] Date: 08/15/2022 Prepared by: Rudi Heap   Exercises - Seated Scapular Retraction  - 2 x daily - 7 x weekly - 2 sets - 10 reps - Seated Upper Trapezius Stretch  - 2 x daily - 7 x weekly - 2 sets - 2 reps - 30 hold     ASSESSMENT:   CLINICAL IMPRESSION: worked him back in to his Strength program with good overall tolerance. Continued with manual therapy for DN and spinal manipulation to help with his chronic thoracic pain. Goals still ongoing at this time.   OBJECTIVE IMPAIRMENTS: decreased activity tolerance, difficulty walking, decreased balance, decreased endurance, decreased mobility, decreased ROM, decreased strength, impaired flexibility, impaired UE/LE use, postural dysfunction, and pain.   ACTIVITY LIMITATIONS: bending, lifting, carry, locomotion, cleaning, community activity, driving, and or occupation   PERSONAL FACTORS: HTN, CVA are also affecting patient's functional outcome.   REHAB POTENTIAL: Good   CLINICAL DECISION MAKING: Stable/uncomplicated   EVALUATION COMPLEXITY: Low       GOALS: Short term PT Goals Target date: 09/12/2022 Pt will be I and compliant with HEP. Baseline:  Goal status: MET Pt will decrease pain by 25% overall Baseline: Goal status: ongoing   Long term PT goals Target date: 10/10/2022 Pt will improve ROM to [facility] to improve functional  mobility Baseline: Goal status: ongoing Pt will improve  hip/knee strength to at least 5-/5 MMT to improve functional strength Baseline: Goal status: ongoing Pt will improve FOTO to at least 42% functional to show improved function Baseline: Goal status: ongoing Pt will reduce pain by overall 50% overall with usual activity Baseline: Goal status: ongoing Pt will reduce pain to overall less than 2-3/10 with usual activity and work activity. Baseline: Goal status: ongoing Pt will be able to ambulate community distances at least 1000 ft WNL gait  pattern without complaints Baseline: Goal status: ongoing       7. Pt will improve her STS by 2.3 seconds for Minimal clinically important difference.                    Baseline:                   Goal Status: ongoing   PLAN: PT FREQUENCY: 2 times per week    PT DURATION: 8 weeks   PLANNED INTERVENTIONS (unless contraindicated): aquatic PT, Canalith repositioning, cryotherapy, Electrical stimulation, Iontophoresis with 4 mg/ml dexamethasome, Moist heat, traction, Ultrasound, gait training, Therapeutic exercise, balance training, neuromuscular re-education, patient/family education, prosthetic training, manual techniques, passive ROM, dry needling, taping, vasopnuematic device, vestibular, spinal manipulations, joint manipulations   PLAN FOR NEXT SESSION: How did he do after DN and spinal manip last time and repeat if desired. continue to progress functional mobility, strengthen proximal hip, glutes and parascapular muscles.      Debbe Odea, PT,DPT 09/02/2022, 9:30 AM

## 2022-09-04 ENCOUNTER — Encounter: Payer: Self-pay | Admitting: Physical Therapy

## 2022-09-04 ENCOUNTER — Ambulatory Visit: Payer: Medicare HMO | Admitting: Physical Therapy

## 2022-09-04 DIAGNOSIS — M546 Pain in thoracic spine: Secondary | ICD-10-CM

## 2022-09-04 DIAGNOSIS — M6281 Muscle weakness (generalized): Secondary | ICD-10-CM

## 2022-09-04 DIAGNOSIS — M25551 Pain in right hip: Secondary | ICD-10-CM

## 2022-09-04 DIAGNOSIS — R262 Difficulty in walking, not elsewhere classified: Secondary | ICD-10-CM

## 2022-09-04 DIAGNOSIS — M25651 Stiffness of right hip, not elsewhere classified: Secondary | ICD-10-CM | POA: Diagnosis not present

## 2022-09-04 DIAGNOSIS — M5459 Other low back pain: Secondary | ICD-10-CM

## 2022-09-04 NOTE — Therapy (Signed)
OUTPATIENT PHYSICAL THERAPY TREATMENT NOTE   [name] MRN: [id number] DOB:Jul 11, 1951, 71 y.o., male Today's Date: 09/04/2022  END OF SESSION:   PT End of Session - 09/04/22 0843     Visit Number 5    Number of Visits 16    Date for PT Re-Evaluation 10/16/22    Authorization Type [employer]ET[employer] MEDICARE HMO/PPO    PT Start Time 3662    PT Stop Time 0916    PT Time Calculation (min) 38 min    Activity Tolerance Patient tolerated treatment well    Behavior During Therapy [facility] for tasks assessed/performed             Past Medical History:  Diagnosis Date   Abdominal wall pain 04/02/2021   Acute ischemic stroke ([place]) 03/22/2020   Avulsed toenail, initial encounter 05/04/2018   Dyspnea on exertion 02/10/2020   ED (erectile dysfunction)    Epidermoid cyst of neck 10/16/2017   Essential hypertension 07/21/2017   GERD (gastroesophageal reflux disease)    Gout of multiple sites 07/21/2017   History of kidney stones    History of squamous cell carcinoma excision    Hyperlipidemia    Hypertension not at goal 01/19/2014   It band syndrome, left 03/19/2020   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/24/2020   Left ureteral calculus    Lower abdominal pain 08/27/2015   Metatarsalgia of both feet 03/19/2020   Multiple joint pain 07/16/2018   Pain in joint, shoulder region 12/11/2015   Right flank pain 04/02/2021   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   Urgency of urination    URI (upper respiratory infection) 11/08/2014   Wears glasses    Past Surgical History:  Procedure Laterality Date   COLONOSCOPY WITH PROPOFOL  2017   CYSTOSCOPY/RETROGRADE/URETEROSCOPY/STONE EXTRACTION WITH BASKET Left 04/16/2018   Procedure: CYSTOSCOPY/RETROGRADE/URETEROSCOPY/STONE EXTRACTION WITH BASKET/ HOLMIUM LASER LITHOTRIPSY/ STENT PLACEMENT;  Surgeon: Ardis Hughs, MD;  Location: [facility];  Service: Urology;  Laterality: Left;   HERNIA  REPAIR Right 2022   inguinal   HOLMIUM LASER APPLICATION Left 94/76/5465   Procedure: HOLMIUM LASER APPLICATION;  Surgeon: Ardis Hughs, MD;  Location: [facility];  Service: Urology;  Laterality: Left;   TOTAL HIP ARTHROPLASTY Right 06/30/2022   Procedure: RIGHT TOTAL HIP ARTHROPLASTY ANTERIOR APPROACH;  Surgeon: Leandrew Koyanagi, MD;  Location: [place];  Service: Orthopedics;  Laterality: Right;  3-C   Patient Active Problem List   Diagnosis Date Noted   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   Preoperative cardiovascular examination 06/05/2022   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   Primary osteoarthritis of left hip 03/19/2022   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   Chronic gouty arthritis 04/23/2021   Primary osteoarthritis 04/23/2021   Abdominal wall pain 04/02/2021   Dysphagia 04/02/2021   Right flank pain 04/02/2021   Wears glasses    Left ureteral calculus    Hypertension    Hyperlipidemia    History of squamous cell carcinoma excision    Chronic gout    Right sided abdominal pain 01/14/2021   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/24/2020   Acute ischemic stroke ([place]) 03/22/2020   It band syndrome, left 03/19/2020   Metatarsalgia of both feet 03/19/2020   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   Polyarthralgia 07/16/2018   Essential hypertension 07/21/2017   Gout of multiple sites 07/21/2017     THERAPY DIAG:  Difficulty in walking, not elsewhere  classified  Stiffness of right hip, not elsewhere classified  Muscle weakness (generalized)  Pain in right hip  Other low back pain  Pain in thoracic spine  PCP: Michael Boston, MD   REFERRING PROVIDER: Leandrew Koyanagi, MD   REFERRING DIAG: History of total hip replacement, right [Z96.641], Chronic midline low back pain without sciatica [M54.50, G89.29]   Rationale for Evaluation and Treatment Rehabilitation   ONSET DATE: August 14th - THA      SUBJECTIVE:    SUBJECTIVE STATEMENT: Pt relays we may have overdid it with hip/leg strength last session, he had some pain and soreness after, wants to only strengthen upper body today and have DN to this Rt quads.   PERTINENT HISTORY: HTN, stoke   PAIN:  Are you having pain? Yes: NPRS scale: 5/10 Pain location: R shoulder, R hip into anterior thigh  Pain description: Shoulder: Achy in arm that radiates into R scapular, R Hip: soreness that radiates into anterior thigh and knee Aggravating factors: Upon waking, and at the end of the day are most painful. Walking >1/2 mile.  Relieving factors: Ice, heat, theragun, meds.    PRECAUTIONS: None   WEIGHT BEARING RESTRICTIONS No   FALLS:  Has patient fallen in last 6 months? No   LIVING ENVIRONMENT: Lives with: lives with their family Lives in: House/apartment Stairs: Yes: Internal: 12 steps; on right going up and External: 4 steps; bilateral but cannot reach both Has following equipment at home: None   OCCUPATION: Admissions director.    PLOF: Independent   PATIENT GOALS Gain strength and relieve pain.      OBJECTIVE:    DIAGNOSTIC FINDINGS: None at this time.    PATIENT SURVEYS:  FOTO 36.7200%, 42% in 16 visits.    COGNITION:            Overall cognitive status: Within functional limits for tasks assessed                          SENSATION: WFL   POSTURE: rounded shoulders and forward head   PALPATION: R Rhombiods, UT, R QL.    LOWER EXTREMITY ROM:   Active ROM Right eval Left eval  Hip flexion 108 120  Hip extension      Hip internal rotation 52 11  Hip external rotation 30 22  Knee flexion [facility] WFL  Knee extension WFL WFL   (Blank rows = not tested)   LOWER EXTREMITY MMT:   MMT Right eval Left eval  Hip flexion Pt unable to get into position for testing due to pain and weakness 3+  Hip extension      Hip abduction Pt unable to get into position for testing due to pain 3+  Knee flexion 4+ 4+   Knee extension 4- 5   (Blank rows = not tested)   Active ROM Right eval Left eval  Shoulder flexion [facility]  Shoulder extension [facility]  Shoulder abduction [facility]  Shoulder internal rotation [facility]  Shoulder external rotation [facility]    MMT Right eval Left eval  Shoulder flexion 3+ 3+  Shoulder abduction 3 4+  Lower trap  3+ 3+  Shoulder internal rotation 4+ 4+  Shoulder external rotation 4+ 4+      FUNCTIONAL TESTS:  5 times sit to stand: 20.85 sec  Timed up and go (TUG): 11.24 sec    GAIT: Distance walked: 35f  Assistive device utilized: None Level of  assistance: Complete Independence Comments: Decreased stance time on R LE and hip drop.    TODAY'S TREATMENT: 09/04/22 -Row machine 25# 2X15 -Lat pull machine 20# 2X15 -Chest press machine 15# 2X15 -Standing shoulder extension on cable alternating unilat with 10# X 15 -Standing bilat ER with scap retraction green 2X10  Manual therapy for skilled palpation and Trigger Point Dry-Needling  Treatment instructions: Expect mild to moderate muscle soreness. Patient Consent Given: Yes Education handout provided: verbally provided Muscles treated: Rt quads Treatment response/outcome: good overall tolerance,twitch response noted     09/02/22 -Nu step L6 X 8 min -Row machine 25# 2X15 -Lat pull machine 20# 2X15 -Chest press machine 15# 2X10 -Leg press machine 50# DL 2X15 -Sidstepping with green around ankles 3 round trips at counter top -LAQ 5# X 20 bilat  Manual therapy for skilled palpation and Trigger Point Dry-Needling  Treatment instructions: Expect mild to moderate muscle soreness. Patient Consent Given: Yes Education handout provided: verbally provided Muscles treated: Rt lumbar thoracic paraspinals and multifidi Treatment response/outcome: good overall tolerance,twitch response noted  (moist hot pack applied after X 10 minutes that was not included in treatment time))  Spinal thoraic mobs grade 3,  Thoracic manipulations with + cavitation noted in both prone and longsitting-supine approach       PATIENT EDUCATION:  Education details: Educated pt on anatomy and physiology of current symptoms, FOTO, diagnosis, prognosis, HEP,  and POC. Person educated: Patient Education method: Customer service manager Education comprehension: verbalized understanding and returned demonstration     HOME EXERCISE PROGRAM: Access Code: [email or web address] URL: [email or web address] Date: 08/15/2022 Prepared by: Rudi Heap   Exercises - Seated Scapular Retraction  - 2 x daily - 7 x weekly - 2 sets - 10 reps - Seated Upper Trapezius Stretch  - 2 x daily - 7 x weekly - 2 sets - 2 reps - 30 hold     ASSESSMENT:   CLINICAL IMPRESSION: he was too sore after last time performing leg strengthening so this was held off today and instead performed upper body strengthening and DN to his quads. We will attempt to slowly and more gently work back to hip/knee strengthening as pain/soreness allows.    OBJECTIVE IMPAIRMENTS: decreased activity tolerance, difficulty walking, decreased balance, decreased endurance, decreased mobility, decreased ROM, decreased strength, impaired flexibility, impaired UE/LE use, postural dysfunction, and pain.   ACTIVITY LIMITATIONS: bending, lifting, carry, locomotion, cleaning, community activity, driving, and or occupation   PERSONAL FACTORS: HTN, CVA are also affecting patient's functional outcome.   REHAB POTENTIAL: Good   CLINICAL DECISION MAKING: Stable/uncomplicated   EVALUATION COMPLEXITY: Low       GOALS: Short term PT Goals Target date: 09/12/2022 Pt will be I and compliant with HEP. Baseline:  Goal status: MET Pt will decrease pain by 25% overall Baseline: Goal status: ongoing   Long term PT goals Target date: 10/10/2022 Pt will improve ROM to [facility] to improve functional mobility Baseline: Goal status: ongoing Pt will improve  hip/knee strength  to at least 5-/5 MMT to improve functional strength Baseline: Goal status: ongoing Pt will improve FOTO to at least 42% functional to show improved function Baseline: Goal status: ongoing Pt will reduce pain by overall 50% overall with usual activity Baseline: Goal status: ongoing Pt will reduce pain to overall less than 2-3/10 with usual activity and work activity. Baseline: Goal status: ongoing Pt will be able to ambulate community distances at least 1000 ft WNL gait pattern without complaints Baseline: Goal status: ongoing  7. Pt will improve her STS by 2.3 seconds for Minimal clinically important difference.                    Baseline:                   Goal Status: ongoing   PLAN: PT FREQUENCY: 2 times per week    PT DURATION: 8 weeks   PLANNED INTERVENTIONS (unless contraindicated): aquatic PT, Canalith repositioning, cryotherapy, Electrical stimulation, Iontophoresis with 4 mg/ml dexamethasome, Moist heat, traction, Ultrasound, gait training, Therapeutic exercise, balance training, neuromuscular re-education, patient/family education, prosthetic training, manual techniques, passive ROM, dry needling, taping, vasopnuematic device, vestibular, spinal manipulations, joint manipulations   PLAN FOR NEXT SESSION: DN or manip if desired. continue to progress functional mobility, strengthen proximal hip, glutes and parascapular muscles.      Debbe Odea, PT,DPT 09/04/2022, 9:17 AM

## 2022-09-08 DIAGNOSIS — F4542 Pain disorder with related psychological factors: Secondary | ICD-10-CM | POA: Diagnosis not present

## 2022-09-08 DIAGNOSIS — R69 Illness, unspecified: Secondary | ICD-10-CM | POA: Diagnosis not present

## 2022-09-09 ENCOUNTER — Ambulatory Visit: Payer: Medicare HMO | Admitting: Physical Therapy

## 2022-09-09 ENCOUNTER — Encounter: Payer: Self-pay | Admitting: Physical Therapy

## 2022-09-09 DIAGNOSIS — M546 Pain in thoracic spine: Secondary | ICD-10-CM | POA: Diagnosis not present

## 2022-09-09 DIAGNOSIS — M25551 Pain in right hip: Secondary | ICD-10-CM

## 2022-09-09 DIAGNOSIS — M6281 Muscle weakness (generalized): Secondary | ICD-10-CM | POA: Diagnosis not present

## 2022-09-09 DIAGNOSIS — M25651 Stiffness of right hip, not elsewhere classified: Secondary | ICD-10-CM

## 2022-09-09 DIAGNOSIS — R262 Difficulty in walking, not elsewhere classified: Secondary | ICD-10-CM

## 2022-09-09 DIAGNOSIS — M5459 Other low back pain: Secondary | ICD-10-CM

## 2022-09-09 NOTE — Therapy (Signed)
OUTPATIENT PHYSICAL THERAPY TREATMENT NOTE   [name] MRN: [id number] DOB:May 12, 1951, 71 y.o., male Today's Date: 09/09/2022  END OF SESSION:   PT End of Session - 09/09/22 0841     Visit Number 6    Number of Visits 16    Date for PT Re-Evaluation 10/16/22    Authorization Type AET[employer] MEDICARE HMO/PPO    PT Start Time 0840    PT Stop Time 0906    PT Time Calculation (min) 26 min    Activity Tolerance Patient tolerated treatment well    Behavior During Therapy [name] for tasks assessed/performed             Past Medical History:  Diagnosis Date   Abdominal wall pain 04/02/2021   Acute ischemic stroke ([place]) 03/22/2020   Avulsed toenail, initial encounter 05/04/2018   Dyspnea on exertion 02/10/2020   ED (erectile dysfunction)    Epidermoid cyst of neck 10/16/2017   Essential hypertension 07/21/2017   GERD (gastroesophageal reflux disease)    Gout of multiple sites 07/21/2017   History of kidney stones    History of squamous cell carcinoma excision    Hyperlipidemia    Hypertension not at goal 01/19/2014   It band syndrome, left 03/19/2020   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/24/2020   Left ureteral calculus    Lower abdominal pain 08/27/2015   Metatarsalgia of both feet 03/19/2020   Multiple joint pain 07/16/2018   Pain in joint, shoulder region 12/11/2015   Right flank pain 04/02/2021   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   Urgency of urination    URI (upper respiratory infection) 11/08/2014   Wears glasses    Past Surgical History:  Procedure Laterality Date   COLONOSCOPY WITH PROPOFOL  2017   CYSTOSCOPY/RETROGRADE/URETEROSCOPY/STONE EXTRACTION WITH BASKET Left 04/16/2018   Procedure: CYSTOSCOPY/RETROGRADE/URETEROSCOPY/STONE EXTRACTION WITH BASKET/ HOLMIUM LASER LITHOTRIPSY/ STENT PLACEMENT;  Surgeon: Ardis Hughs, MD;  Location: [name];  Service: Urology;  Laterality: Left;   HERNIA  REPAIR Right 2022   inguinal   HOLMIUM LASER APPLICATION Left 19/41/7408   Procedure: HOLMIUM LASER APPLICATION;  Surgeon: Ardis Hughs, MD;  Location: [name];  Service: Urology;  Laterality: Left;   TOTAL HIP ARTHROPLASTY Right 06/30/2022   Procedure: RIGHT TOTAL HIP ARTHROPLASTY ANTERIOR APPROACH;  Surgeon: Leandrew Koyanagi, MD;  Location: [name];  Service: Orthopedics;  Laterality: Right;  3-C   Patient Active Problem List   Diagnosis Date Noted   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   Preoperative cardiovascular examination 06/05/2022   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   Primary osteoarthritis of left hip 03/19/2022   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   Chronic gouty arthritis 04/23/2021   Primary osteoarthritis 04/23/2021   Abdominal wall pain 04/02/2021   Dysphagia 04/02/2021   Right flank pain 04/02/2021   Wears glasses    Left ureteral calculus    Hypertension    Hyperlipidemia    History of squamous cell carcinoma excision    Chronic gout    Right sided abdominal pain 01/14/2021   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/24/2020   Acute ischemic stroke ([place]) 03/22/2020   It band syndrome, left 03/19/2020   Metatarsalgia of both feet 03/19/2020   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   Polyarthralgia 07/16/2018   Essential hypertension 07/21/2017   Gout of multiple sites 07/21/2017     THERAPY DIAG:  Difficulty in walking, not elsewhere  classified  Stiffness of right hip, not elsewhere classified  Muscle weakness (generalized)  Pain in right hip  Other low back pain  Pain in thoracic spine  PCP: Michael Boston, MD   REFERRING PROVIDER: Leandrew Koyanagi, MD   REFERRING DIAG: History of total hip replacement, right [Z96.641], Chronic midline low back pain without sciatica [M54.50, G89.29]   Rationale for Evaluation and Treatment Rehabilitation   ONSET DATE: August 14th - THA      SUBJECTIVE:    SUBJECTIVE STATEMENT: Pt relays some back pain and hip pain from busy weekend preparing for a wedding. He wants to do upper body workout  today and avoid lower body work today.    PERTINENT HISTORY: HTN, stoke   PAIN:  Are you having pain? Yes: NPRS scale:6 /10 Pain location: R shoulder, R hip into anterior thigh  Pain description: Shoulder: Achy in arm that radiates into R scapular, R Hip: soreness that radiates into anterior thigh and knee Aggravating factors: Upon waking, and at the end of the day are most painful. Walking >1/2 mile.  Relieving factors: Ice, heat, theragun, meds.    PRECAUTIONS: None   WEIGHT BEARING RESTRICTIONS No   FALLS:  Has patient fallen in last 6 months? No   LIVING ENVIRONMENT: Lives with: lives with their family Lives in: House/apartment Stairs: Yes: Internal: 12 steps; on right going up and External: 4 steps; bilateral but cannot reach both Has following equipment at home: None   OCCUPATION: Admissions director.    PLOF: Independent   PATIENT GOALS Gain strength and relieve pain.      OBJECTIVE:    DIAGNOSTIC FINDINGS: None at this time.    PATIENT SURVEYS:  [name] 36.7200%, 42% in 16 visits.    COGNITION:            Overall cognitive status: Within functional limits for tasks assessed                          SENSATION: WFL   POSTURE: rounded shoulders and forward head   PALPATION: R Rhombiods, UT, R QL.    LOWER EXTREMITY ROM:   Active ROM Right eval Left eval  Hip flexion 108 120  Hip extension      Hip internal rotation 52 11  Hip external rotation 30 22  Knee flexion [name] WFL  Knee extension WFL WFL   (Blank rows = not tested)   LOWER EXTREMITY MMT:   MMT Right eval Left eval  Hip flexion Pt unable to get into position for testing due to pain and weakness 3+  Hip extension      Hip abduction Pt unable to get into position for testing due to pain 3+  Knee flexion 4+ 4+  Knee extension 4- 5    (Blank rows = not tested)   Active ROM Right eval Left eval  Shoulder flexion [facility]  Shoulder extension [name]  Shoulder abduction [name]  Shoulder internal rotation [name]  Shoulder external rotation [place] [name]    MMT Right eval Left eval  Shoulder flexion 3+ 3+  Shoulder abduction 3 4+  Lower trap  3+ 3+  Shoulder internal rotation 4+ 4+  Shoulder external rotation 4+ 4+      FUNCTIONAL TESTS:  5 times sit to stand: 20.85 sec  Timed up and go (TUG): 11.24 sec    GAIT: Distance walked: 73ft  Assistive device utilized: None Level of assistance: Complete  Independence Comments: Decreased stance time on R LE and hip drop.    TODAY'S TREATMENT: 09/09/22 -Row machine 25# 2X15 -Lat pull machine 25# 2X15 -Chest press machine 15# 2X15 -Standing shoulder extension on cable alternating unilat with 15# X 15 -Standing bilat ER with scap retraction green 2X15 -Standing bilat horizontal abd green 2X15 -Standing bilat shoulder flexion with 3# bar 2X10 -UBE L 3 for 6 min total switch directions half way  Manual therapy declined  09/04/22 -Row machine 25# 2X15 -Lat pull machine 20# 2X15 -Chest press machine 15# 2X15 -Standing shoulder extension on cable alternating unilat with 10# X 15 -Standing bilat ER with scap retraction green 2X10  Manual therapy for skilled palpation and Trigger Point Dry-Needling  Treatment instructions: Expect mild to moderate muscle soreness. Patient Consent Given: Yes Education handout provided: verbally provided Muscles treated: Rt quads Treatment response/outcome: good overall tolerance,twitch response noted     09/02/22 -Nu step L6 X 8 min -Row machine 25# 2X15 -Lat pull machine 20# 2X15 -Chest press machine 15# 2X10 -Leg press machine 50# DL 2X15 -Sidstepping with green around ankles 3 round trips at counter top -LAQ 5# X 20 bilat  Manual therapy for skilled palpation and Trigger Point Dry-Needling  Treatment instructions: Expect  mild to moderate muscle soreness. Patient Consent Given: Yes Education handout provided: verbally provided Muscles treated: Rt lumbar thoracic paraspinals and multifidi Treatment response/outcome: good overall tolerance,twitch response noted  (moist hot pack applied after X 10 minutes that was not included in treatment time))  Spinal thoraic mobs grade 3, Thoracic manipulations with + cavitation noted in both prone and longsitting-supine approach       PATIENT EDUCATION:  Education details: Educated pt on anatomy and physiology of current symptoms, [name], diagnosis, prognosis, HEP,  and POC. Person educated: Patient Education method: Customer service manager Education comprehension: verbalized understanding and returned demonstration     HOME EXERCISE PROGRAM: Access Code: [email or web address] URL: [email or web address] Date: 08/15/2022 Prepared by: Rudi Heap   Exercises - Seated Scapular Retraction  - 2 x daily - 7 x weekly - 2 sets - 10 reps - Seated Upper Trapezius Stretch  - 2 x daily - 7 x weekly - 2 sets - 2 reps - 30 hold     ASSESSMENT:   CLINICAL IMPRESSION: Session focused on upper body strengthening and avoided lower body workout at his request due to hip pain. He declined manual therapy and DN today as he is getting trigger point injections from MD tomorrow.    OBJECTIVE IMPAIRMENTS: decreased activity tolerance, difficulty walking, decreased balance, decreased endurance, decreased mobility, decreased ROM, decreased strength, impaired flexibility, impaired UE/LE use, postural dysfunction, and pain.   ACTIVITY LIMITATIONS: bending, lifting, carry, locomotion, cleaning, community activity, driving, and or occupation   PERSONAL FACTORS: HTN, CVA are also affecting patient's functional outcome.   REHAB POTENTIAL: Good   CLINICAL DECISION MAKING: Stable/uncomplicated   EVALUATION COMPLEXITY: Low       GOALS: Short term PT Goals Target date:  09/12/2022 Pt will be I and compliant with HEP. Baseline:  Goal status: MET Pt will decrease pain by 25% overall Baseline: Goal status: ongoing   Long term PT goals Target date: 10/10/2022 Pt will improve ROM to [name] to improve functional mobility Baseline: Goal status: ongoing Pt will improve  hip/knee strength to at least 5-/5 MMT to improve functional strength Baseline: Goal status: ongoing Pt will improve [name] to at least 42% functional to show improved function Baseline: Goal status: ongoing Pt will reduce  pain by overall 50% overall with usual activity Baseline: Goal status: ongoing Pt will reduce pain to overall less than 2-3/10 with usual activity and work activity. Baseline: Goal status: ongoing Pt will be able to ambulate community distances at least 1000 ft WNL gait pattern without complaints Baseline: Goal status: ongoing       7. Pt will improve her STS by 2.3 seconds for Minimal clinically important difference.                    Baseline:                   Goal Status: ongoing   PLAN: PT FREQUENCY: 2 times per week    PT DURATION: 8 weeks   PLANNED INTERVENTIONS (unless contraindicated): aquatic PT, Canalith repositioning, cryotherapy, Electrical stimulation, Iontophoresis with 4 mg/ml dexamethasome, Moist heat, traction, Ultrasound, gait training, Therapeutic exercise, balance training, neuromuscular re-education, patient/family education, prosthetic training, manual techniques, passive ROM, dry needling, taping, vasopnuematic device, vestibular, spinal manipulations, joint manipulations   PLAN FOR NEXT SESSION: DN or manip if desired. continue to progress functional mobility, strengthen proximal hip, glutes and parascapular muscles.      Debbe Odea, PT,DPT 09/09/2022, 9:06 AM

## 2022-09-10 ENCOUNTER — Ambulatory Visit (INDEPENDENT_AMBULATORY_CARE_PROVIDER_SITE_OTHER): Payer: Medicare HMO | Admitting: Family Medicine

## 2022-09-10 VITALS — BP 170/72 | Ht 70.0 in | Wt 210.0 lb

## 2022-09-10 DIAGNOSIS — M791 Myalgia, unspecified site: Secondary | ICD-10-CM | POA: Diagnosis not present

## 2022-09-11 ENCOUNTER — Encounter: Payer: Medicare HMO | Admitting: Physical Therapy

## 2022-09-11 NOTE — Progress Notes (Signed)
Patient returned today for trigger point injections we discussed last week.  He has three - one right thoracic spine, low lumbar spine, and distal vastus lateralis.  After informed written consent patient was seated on exam table.  Area overlying trigger points right thoracic spine paraspinal muscle, right lumbar spine paraspinal muscle, and right vastus lateralis were prepped with alcohol swabs.  Then areas injected with 1.26mL lidocaine without epinephrine.  Patient tolerated procedure well without immediate complications.

## 2022-09-12 ENCOUNTER — Other Ambulatory Visit: Payer: Self-pay | Admitting: Cardiology

## 2022-09-12 NOTE — Telephone Encounter (Signed)
Refill for Minoxidil sent to pharmacy

## 2022-09-13 ENCOUNTER — Other Ambulatory Visit: Payer: Self-pay | Admitting: Cardiology

## 2022-09-13 DIAGNOSIS — G4733 Obstructive sleep apnea (adult) (pediatric): Secondary | ICD-10-CM | POA: Diagnosis not present

## 2022-09-16 ENCOUNTER — Encounter: Payer: Self-pay | Admitting: Physical Therapy

## 2022-09-16 ENCOUNTER — Ambulatory Visit: Payer: Medicare HMO | Admitting: Physical Therapy

## 2022-09-16 DIAGNOSIS — M6281 Muscle weakness (generalized): Secondary | ICD-10-CM

## 2022-09-16 DIAGNOSIS — M25651 Stiffness of right hip, not elsewhere classified: Secondary | ICD-10-CM

## 2022-09-16 DIAGNOSIS — M25551 Pain in right hip: Secondary | ICD-10-CM

## 2022-09-16 DIAGNOSIS — R262 Difficulty in walking, not elsewhere classified: Secondary | ICD-10-CM

## 2022-09-16 DIAGNOSIS — M5459 Other low back pain: Secondary | ICD-10-CM

## 2022-09-16 DIAGNOSIS — G5790 Unspecified mononeuropathy of unspecified lower limb: Secondary | ICD-10-CM | POA: Diagnosis not present

## 2022-09-16 DIAGNOSIS — G5771 Causalgia of right lower limb: Secondary | ICD-10-CM | POA: Diagnosis not present

## 2022-09-16 NOTE — Therapy (Addendum)
OUTPATIENT PHYSICAL THERAPY TREATMENT NOTE/Discharge addendum PHYSICAL THERAPY DISCHARGE SUMMARY  Visits from Start of Care: 7  Current functional level related to goals / functional outcomes: See below   Remaining deficits: See below   Education / Equipment: HEP  Plan:  Patient goals were some met. Patient is being discharged due to not returning since last visit. Elsie Ra, PT, DPT 10/29/22 10:06 AM       Patient Name: RANDLE SHATZER MRN: [id number] DOB:[birth year], 71 y.o., male Today's Date: 09/16/2022  END OF SESSION:   PT End of Session - 09/16/22 0846     Visit Number 7    Number of Visits 16    Date for PT Re-Evaluation 10/16/22    Authorization Type [employer]ET[employer] MEDICARE HMO/PPO    PT Start Time 0845    PT Stop Time 0917    PT Time Calculation (min) 32 min    Activity Tolerance Patient tolerated treatment well    Behavior During Therapy [facility] for tasks assessed/performed             Past Medical History:  Diagnosis Date   Abdominal wall pain 04/02/2021   Acute ischemic stroke ([place]) 03/22/2020   Avulsed toenail, initial encounter 05/04/2018   Dyspnea on exertion 02/10/2020   ED (erectile dysfunction)    Epidermoid cyst of neck 10/16/2017   Essential hypertension 07/21/2017   GERD (gastroesophageal reflux disease)    Gout of multiple sites 07/21/2017   History of kidney stones    History of squamous cell carcinoma excision    Hyperlipidemia    Hypertension not at goal 01/19/2014   It band syndrome, left 03/19/2020   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/24/2020   Left ureteral calculus    Lower abdominal pain 08/27/2015   Metatarsalgia of both feet 03/19/2020   Multiple joint pain 07/16/2018   Pain in joint, shoulder region 12/11/2015   Right flank pain 04/02/2021   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   Urgency of urination    URI (upper respiratory infection) 11/08/2014   Wears glasses    Past Surgical History:   Procedure Laterality Date   COLONOSCOPY WITH PROPOFOL  2017   CYSTOSCOPY/RETROGRADE/URETEROSCOPY/STONE EXTRACTION WITH BASKET Left 04/16/2018   Procedure: CYSTOSCOPY/RETROGRADE/URETEROSCOPY/STONE EXTRACTION WITH BASKET/ HOLMIUM LASER LITHOTRIPSY/ STENT PLACEMENT;  Surgeon: Ardis Hughs, MD;  Location: [facility];  Service: Urology;  Laterality: Left;   HERNIA REPAIR Right 2022   inguinal   HOLMIUM LASER APPLICATION Left 50/01/7047   Procedure: HOLMIUM LASER APPLICATION;  Surgeon: Ardis Hughs, MD;  [facility];  Service: Urology;  Laterality: Left;   TOTAL HIP ARTHROPLASTY Right 06/30/2022   Procedure: RIGHT TOTAL HIP ARTHROPLASTY ANTERIOR APPROACH;  Surgeon: Leandrew Koyanagi, MD;  Location: [place];  Service: Orthopedics;  Laterality: Right;  3-C   Patient Active Problem List   Diagnosis Date Noted   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   Preoperative cardiovascular examination 06/05/2022   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   Primary osteoarthritis of left hip 03/19/2022   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   Chronic gouty arthritis 04/23/2021   Primary osteoarthritis 04/23/2021   Abdominal wall pain 04/02/2021   Dysphagia 04/02/2021   Right flank pain 04/02/2021   Wears glasses    Left ureteral calculus    Hypertension    Hyperlipidemia    History of squamous cell carcinoma excision    Chronic gout    Right sided abdominal pain 01/14/2021  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/24/2020   Acute ischemic stroke ([place]) 03/22/2020   It band syndrome, left 03/19/2020   Metatarsalgia of both feet 03/19/2020   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   Polyarthralgia 07/16/2018   Essential hypertension 07/21/2017   Gout of multiple sites 07/21/2017     THERAPY DIAG:  Difficulty in walking, not elsewhere classified  Stiffness of right hip, not elsewhere classified  Muscle  weakness (generalized)  Pain in right hip  Other low back pain  PCP: Michael Boston, MD   REFERRING PROVIDER: Leandrew Koyanagi, MD   REFERRING DIAG: History of total hip replacement, right [Z96.641], Chronic midline low back pain without sciatica [M54.50, G89.29]   Rationale for Evaluation and Treatment Rehabilitation   ONSET DATE: August 14th - THA     SUBJECTIVE:    SUBJECTIVE STATEMENT: Pt relays he was busy over the weekend for his daughters wedding so he is feeling the affects in his knee and back today.    PERTINENT HISTORY: HTN, stoke   PAIN:  Are you having pain? Yes: NPRS scale:6 /10 Pain location: R shoulder, R hip into anterior thigh  Pain description: Shoulder: Achy in arm that radiates into R scapular, R Hip: soreness that radiates into anterior thigh and knee Aggravating factors: Upon waking, and at the end of the day are most painful. Walking >1/2 mile.  Relieving factors: Ice, heat, theragun, meds.    PRECAUTIONS: None   WEIGHT BEARING RESTRICTIONS No   FALLS:  Has patient fallen in last 6 months? No   LIVING ENVIRONMENT: Lives with: lives with their family Lives in: House/apartment Stairs: Yes: Internal: 12 steps; on right going up and External: 4 steps; bilateral but cannot reach both Has following equipment at home: None   OCCUPATION: Admissions director.    PLOF: Independent   PATIENT GOALS Gain strength and relieve pain.      OBJECTIVE:    DIAGNOSTIC FINDINGS: None at this time.    PATIENT SURVEYS:  FOTO 36.7200%, 42% in 16 visits.    COGNITION:            Overall cognitive status: Within functional limits for tasks assessed                          SENSATION: WFL   POSTURE: rounded shoulders and forward head   PALPATION: R Rhombiods, UT, R QL.    LOWER EXTREMITY ROM:   Active ROM Right eval Left eval  Hip flexion 108 120  Hip extension      Hip internal rotation 52 11  Hip external rotation 30 22  Knee flexion [facility] WFL   Knee extension WFL WFL   (Blank rows = not tested)   LOWER EXTREMITY MMT:   MMT Right eval Left eval  Hip flexion Pt unable to get into position for testing due to pain and weakness 3+  Hip extension      Hip abduction Pt unable to get into position for testing due to pain 3+  Knee flexion 4+ 4+  Knee extension 4- 5   (Blank rows = not tested)   Active ROM Right eval Left eval  Shoulder flexion [facility]  Shoulder extension [facility]  Shoulder abduction [facility]  Shoulder internal rotation [facility]  Shoulder external rotation [facility]    MMT Right eval Left eval  Shoulder flexion 3+ 3+  Shoulder abduction 3 4+  Lower trap  3+ 3+  Shoulder internal rotation 4+ 4+  Shoulder external rotation 4+ 4+      FUNCTIONAL TESTS:  5 times sit to stand: 20.85 sec  Timed up and go (TUG): 11.24 sec    GAIT: Distance walked: 90f  Assistive device utilized: None Level of assistance: Complete Independence Comments: Decreased stance time on R LE and hip drop.    TODAY'S TREATMENT: 09/16/22 -Row machine 35# 3X10 -Lat pull machine 25# 2X15 -Chest press machine 20# 2X15 -Standing shoulder extension on cable alternating unilat with 15# 2X10 -Standing shoulder rows on cable alternating unilat with 20# 2X10 -Standing bilat ER with scap retraction green 2X15 -Standing bilat horizontal abd green 2X10  Manual therapy for skilled palpation and Trigger Point Dry-Needling  Treatment instructions: Expect mild to moderate muscle soreness. Patient Consent Given: Yes Education handout provided: verbally provided Muscles treated: Rt quads Treatment response/outcome: good overall tolerance,twitch response noted     09/09/22 -Row machine 25# 2X15 -Lat pull machine 25# 2X15 -Chest press machine 15# 2X15 -Standing shoulder extension on cable alternating unilat with 15# X 15 -Standing bilat ER with scap retraction green 2X15 -Standing bilat horizontal abd green 2X15 -Standing bilat shoulder  flexion with 3# bar 2X10 -UBE L 3 for 6 min total switch directions half way  Manual therapy declined     PATIENT EDUCATION:  Education details: Educated pt on anatomy and physiology of current symptoms, FOTO, diagnosis, prognosis, HEP,  and POC. Person educated: Patient Education method: ECustomer service managerEducation comprehension: verbalized understanding and returned demonstration     HOME EXERCISE PROGRAM: Access Code: [email or web address] URL: [email or web address] Date: 08/15/2022 Prepared by: SRudi Heap  Exercises - Seated Scapular Retraction  - 2 x daily - 7 x weekly - 2 sets - 10 reps - Seated Upper Trapezius Stretch  - 2 x daily - 7 x weekly - 2 sets - 2 reps - 30 hold     ASSESSMENT:   CLINICAL IMPRESSION: some associated pain after a busy weekend. He was able to progress resistance some with his strength program. Provided DN to his Rt quads at his request and he had good response to this. He has one more visit scheduled so I will assess his readiness to transition to independent program then.    OBJECTIVE IMPAIRMENTS: decreased activity tolerance, difficulty walking, decreased balance, decreased endurance, decreased mobility, decreased ROM, decreased strength, impaired flexibility, impaired UE/LE use, postural dysfunction, and pain.   ACTIVITY LIMITATIONS: bending, lifting, carry, locomotion, cleaning, community activity, driving, and or occupation   PERSONAL FACTORS: HTN, CVA are also affecting patient's functional outcome.   REHAB POTENTIAL: Good   CLINICAL DECISION MAKING: Stable/uncomplicated   EVALUATION COMPLEXITY: Low       GOALS: Short term PT Goals Target date: 09/12/2022 Pt will be I and compliant with HEP. Baseline:  Goal status: MET Pt will decrease pain by 25% overall Baseline: Goal status: ongoing   Long term PT goals Target date: 10/10/2022 [facility] Baseline: Goal status:  ongoing Pt will improve  hip/knee strength to at least 5-/5 MMT to improve functional strength Baseline: Goal status: ongoing Pt will improve FOTO to at least 42% functional to show improved function Baseline: Goal status: ongoing Pt will reduce pain by overall 50% overall with usual activity Baseline: Goal status: ongoing Pt will reduce pain to overall less than 2-3/10 with usual activity and work activity. Baseline: Goal status: ongoing Pt will be able to ambulate community  distances at least 1000 ft WNL gait pattern without complaints Baseline: Goal status: ongoing       7. Pt will improve her STS by 2.3 seconds for Minimal clinically important difference.                    Baseline:                   Goal Status: ongoing   PLAN: PT FREQUENCY: 2 times per week    PT DURATION: 8 weeks   PLANNED INTERVENTIONS (unless contraindicated): aquatic PT, Canalith repositioning, cryotherapy, Electrical stimulation, Iontophoresis with 4 mg/ml dexamethasome, Moist heat, traction, Ultrasound, gait training, Therapeutic exercise, balance training, neuromuscular re-education, patient/family education, prosthetic training, manual techniques, passive ROM, dry needling, taping, vasopnuematic device, vestibular, spinal manipulations, joint manipulations   PLAN FOR NEXT SESSION: DN or manip if desired. continue to progress functional mobility, strengthen proximal hip, glutes and parascapular muscles.      Debbe Odea, PT,DPT 09/16/2022, 9:17 AM

## 2022-09-18 ENCOUNTER — Encounter: Payer: Medicare HMO | Admitting: Physical Therapy

## 2022-09-22 ENCOUNTER — Encounter: Payer: Self-pay | Admitting: Family Medicine

## 2022-09-24 ENCOUNTER — Encounter: Payer: Self-pay | Admitting: Family Medicine

## 2022-09-24 ENCOUNTER — Ambulatory Visit: Payer: Medicare HMO | Admitting: Family Medicine

## 2022-09-24 VITALS — BP 172/70 | Ht 70.0 in | Wt 204.0 lb

## 2022-09-24 DIAGNOSIS — M791 Myalgia, unspecified site: Secondary | ICD-10-CM | POA: Diagnosis not present

## 2022-09-24 DIAGNOSIS — M549 Dorsalgia, unspecified: Secondary | ICD-10-CM

## 2022-09-24 MED ORDER — METHYLPREDNISOLONE ACETATE 40 MG/ML IJ SUSP
20.0000 mg | Freq: Once | INTRAMUSCULAR | Status: AC
  Administered 2022-09-24: 20 mg via INTRA_ARTICULAR

## 2022-09-24 NOTE — Progress Notes (Signed)
Patient returned today for another trigger point he's developed in right thoracic area.  He did well at his daughter's wedding though a little sore by the end of the day.  Injections last visit did help with some of his pain.  He is due to have nerve block on 11/22, probable implantable nerve device in future as well.  After informed written consent timeout was performed.  Patient was lying prone on exam table.  Area overlying right thoracic paraspinal muscles in area of maximal pain prepped with alcohol swab then utilizing ultrasound guidance, patients right thoracic paraspinal musculature trigger point injected with 2:0.6mL lidocaine: depomedrol 40.  Patient tolerated procedure well without immediate complications.

## 2022-09-27 ENCOUNTER — Other Ambulatory Visit: Payer: Self-pay | Admitting: Orthopaedic Surgery

## 2022-09-28 ENCOUNTER — Other Ambulatory Visit: Payer: Self-pay | Admitting: Physician Assistant

## 2022-09-29 ENCOUNTER — Encounter: Payer: Self-pay | Admitting: Family Medicine

## 2022-09-29 MED ORDER — METHOCARBAMOL 750 MG PO TABS: 750.0000 mg | ORAL_TABLET | Freq: Three times a day (TID) | ORAL | 1 refills | Status: DC | PRN

## 2022-10-03 MED ORDER — ONDANSETRON HCL 4 MG PO TABS: 4.0000 mg | ORAL_TABLET | Freq: Three times a day (TID) | ORAL | 1 refills | Status: DC | PRN

## 2022-10-03 MED ORDER — DICLOFENAC SODIUM 75 MG PO TBEC: 75.0000 mg | DELAYED_RELEASE_TABLET | Freq: Two times a day (BID) | ORAL | 1 refills | Status: DC

## 2022-10-03 NOTE — Addendum Note (Signed)
Addended by: Dene Gentry on: 10/03/2022 10:50 AM   Modules accepted: Orders

## 2022-10-06 ENCOUNTER — Ambulatory Visit: Payer: Medicare HMO | Admitting: Family Medicine

## 2022-10-07 DIAGNOSIS — G5791 Unspecified mononeuropathy of right lower limb: Secondary | ICD-10-CM | POA: Diagnosis not present

## 2022-10-13 DIAGNOSIS — G4733 Obstructive sleep apnea (adult) (pediatric): Secondary | ICD-10-CM | POA: Diagnosis not present

## 2022-10-15 DIAGNOSIS — R110 Nausea: Secondary | ICD-10-CM | POA: Diagnosis not present

## 2022-10-15 DIAGNOSIS — R1011 Right upper quadrant pain: Secondary | ICD-10-CM | POA: Diagnosis not present

## 2022-10-21 ENCOUNTER — Other Ambulatory Visit: Payer: Self-pay | Admitting: Internal Medicine

## 2022-10-21 DIAGNOSIS — R1011 Right upper quadrant pain: Secondary | ICD-10-CM

## 2022-10-27 ENCOUNTER — Inpatient Hospital Stay (HOSPITAL_COMMUNITY)
Admission: EM | Admit: 2022-10-27 | Discharge: 2022-11-02 | DRG: 478 | Disposition: A | Discharge status: Home / Self Care | Payer: Medicare HMO | Attending: Internal Medicine | Admitting: Internal Medicine

## 2022-10-27 ENCOUNTER — Emergency Department (HOSPITAL_COMMUNITY): Payer: Medicare HMO

## 2022-10-27 ENCOUNTER — Other Ambulatory Visit: Payer: Self-pay

## 2022-10-27 ENCOUNTER — Encounter (HOSPITAL_COMMUNITY): Payer: Self-pay

## 2022-10-27 DIAGNOSIS — I771 Stricture of artery: Secondary | ICD-10-CM | POA: Diagnosis present

## 2022-10-27 DIAGNOSIS — Z7982 Long term (current) use of aspirin: Secondary | ICD-10-CM | POA: Diagnosis not present

## 2022-10-27 DIAGNOSIS — Z87891 Personal history of nicotine dependence: Secondary | ICD-10-CM | POA: Diagnosis not present

## 2022-10-27 DIAGNOSIS — K5900 Constipation, unspecified: Secondary | ICD-10-CM | POA: Diagnosis not present

## 2022-10-27 DIAGNOSIS — I2510 Atherosclerotic heart disease of native coronary artery without angina pectoris: Secondary | ICD-10-CM | POA: Diagnosis not present

## 2022-10-27 DIAGNOSIS — R0789 Other chest pain: Secondary | ICD-10-CM | POA: Diagnosis not present

## 2022-10-27 DIAGNOSIS — C7951 Secondary malignant neoplasm of bone: Secondary | ICD-10-CM | POA: Diagnosis not present

## 2022-10-27 DIAGNOSIS — C799 Secondary malignant neoplasm of unspecified site: Secondary | ICD-10-CM

## 2022-10-27 DIAGNOSIS — Z510 Encounter for antineoplastic radiation therapy: Secondary | ICD-10-CM | POA: Diagnosis not present

## 2022-10-27 DIAGNOSIS — Z8249 Family history of ischemic heart disease and other diseases of the circulatory system: Secondary | ICD-10-CM | POA: Diagnosis not present

## 2022-10-27 DIAGNOSIS — R222 Localized swelling, mass and lump, trunk: Secondary | ICD-10-CM | POA: Diagnosis not present

## 2022-10-27 DIAGNOSIS — G893 Neoplasm related pain (acute) (chronic): Secondary | ICD-10-CM | POA: Diagnosis present

## 2022-10-27 DIAGNOSIS — Z85828 Personal history of other malignant neoplasm of skin: Secondary | ICD-10-CM | POA: Diagnosis not present

## 2022-10-27 DIAGNOSIS — Z515 Encounter for palliative care: Secondary | ICD-10-CM | POA: Diagnosis not present

## 2022-10-27 DIAGNOSIS — C3490 Malignant neoplasm of unspecified part of unspecified bronchus or lung: Secondary | ICD-10-CM | POA: Diagnosis not present

## 2022-10-27 DIAGNOSIS — Z79899 Other long term (current) drug therapy: Secondary | ICD-10-CM

## 2022-10-27 DIAGNOSIS — I774 Celiac artery compression syndrome: Secondary | ICD-10-CM | POA: Diagnosis not present

## 2022-10-27 DIAGNOSIS — I69398 Other sequelae of cerebral infarction: Secondary | ICD-10-CM | POA: Diagnosis not present

## 2022-10-27 DIAGNOSIS — Z818 Family history of other mental and behavioral disorders: Secondary | ICD-10-CM | POA: Diagnosis not present

## 2022-10-27 DIAGNOSIS — E785 Hyperlipidemia, unspecified: Secondary | ICD-10-CM | POA: Diagnosis not present

## 2022-10-27 DIAGNOSIS — K760 Fatty (change of) liver, not elsewhere classified: Secondary | ICD-10-CM | POA: Diagnosis not present

## 2022-10-27 DIAGNOSIS — C3411 Malignant neoplasm of upper lobe, right bronchus or lung: Secondary | ICD-10-CM | POA: Diagnosis not present

## 2022-10-27 DIAGNOSIS — I10 Essential (primary) hypertension: Secondary | ICD-10-CM | POA: Diagnosis not present

## 2022-10-27 DIAGNOSIS — M898X5 Other specified disorders of bone, thigh: Secondary | ICD-10-CM | POA: Diagnosis not present

## 2022-10-27 DIAGNOSIS — R451 Restlessness and agitation: Secondary | ICD-10-CM | POA: Diagnosis not present

## 2022-10-27 DIAGNOSIS — Z96641 Presence of right artificial hip joint: Secondary | ICD-10-CM | POA: Diagnosis not present

## 2022-10-27 DIAGNOSIS — Z807 Family history of other malignant neoplasms of lymphoid, hematopoietic and related tissues: Secondary | ICD-10-CM | POA: Diagnosis not present

## 2022-10-27 DIAGNOSIS — K5730 Diverticulosis of large intestine without perforation or abscess without bleeding: Secondary | ICD-10-CM | POA: Diagnosis not present

## 2022-10-27 DIAGNOSIS — R1011 Right upper quadrant pain: Secondary | ICD-10-CM | POA: Diagnosis not present

## 2022-10-27 DIAGNOSIS — I6782 Cerebral ischemia: Secondary | ICD-10-CM | POA: Diagnosis not present

## 2022-10-27 DIAGNOSIS — J929 Pleural plaque without asbestos: Secondary | ICD-10-CM | POA: Diagnosis not present

## 2022-10-27 DIAGNOSIS — Z888 Allergy status to other drugs, medicaments and biological substances status: Secondary | ICD-10-CM

## 2022-10-27 DIAGNOSIS — K551 Chronic vascular disorders of intestine: Secondary | ICD-10-CM | POA: Diagnosis not present

## 2022-10-27 DIAGNOSIS — I700 Atherosclerosis of aorta: Secondary | ICD-10-CM | POA: Diagnosis not present

## 2022-10-27 DIAGNOSIS — Z803 Family history of malignant neoplasm of breast: Secondary | ICD-10-CM

## 2022-10-27 DIAGNOSIS — C7931 Secondary malignant neoplasm of brain: Secondary | ICD-10-CM | POA: Diagnosis not present

## 2022-10-27 DIAGNOSIS — I719 Aortic aneurysm of unspecified site, without rupture: Secondary | ICD-10-CM | POA: Insufficient documentation

## 2022-10-27 DIAGNOSIS — R1013 Epigastric pain: Secondary | ICD-10-CM | POA: Diagnosis not present

## 2022-10-27 DIAGNOSIS — K219 Gastro-esophageal reflux disease without esophagitis: Secondary | ICD-10-CM | POA: Diagnosis not present

## 2022-10-27 DIAGNOSIS — R52 Pain, unspecified: Principal | ICD-10-CM | POA: Diagnosis present

## 2022-10-27 DIAGNOSIS — C801 Malignant (primary) neoplasm, unspecified: Secondary | ICD-10-CM | POA: Diagnosis not present

## 2022-10-27 DIAGNOSIS — N200 Calculus of kidney: Secondary | ICD-10-CM | POA: Diagnosis not present

## 2022-10-27 DIAGNOSIS — R918 Other nonspecific abnormal finding of lung field: Secondary | ICD-10-CM

## 2022-10-27 DIAGNOSIS — N281 Cyst of kidney, acquired: Secondary | ICD-10-CM | POA: Diagnosis not present

## 2022-10-27 DIAGNOSIS — Z811 Family history of alcohol abuse and dependence: Secondary | ICD-10-CM

## 2022-10-27 DIAGNOSIS — C3492 Malignant neoplasm of unspecified part of left bronchus or lung: Secondary | ICD-10-CM | POA: Diagnosis not present

## 2022-10-27 DIAGNOSIS — C3431 Malignant neoplasm of lower lobe, right bronchus or lung: Secondary | ICD-10-CM | POA: Diagnosis not present

## 2022-10-27 DIAGNOSIS — M898X8 Other specified disorders of bone, other site: Secondary | ICD-10-CM | POA: Diagnosis not present

## 2022-10-27 DIAGNOSIS — I714 Abdominal aortic aneurysm, without rupture, unspecified: Secondary | ICD-10-CM | POA: Diagnosis not present

## 2022-10-27 DIAGNOSIS — M109 Gout, unspecified: Secondary | ICD-10-CM | POA: Diagnosis not present

## 2022-10-27 DIAGNOSIS — I288 Other diseases of pulmonary vessels: Secondary | ICD-10-CM | POA: Diagnosis not present

## 2022-10-27 LAB — COMPREHENSIVE METABOLIC PANEL
ALT: 17 U/L (ref 0–44)
AST: 17 U/L (ref 15–41)
Albumin: 4.1 g/dL (ref 3.5–5.0)
Alkaline Phosphatase: 104 U/L (ref 38–126)
Anion gap: 11 (ref 5–15)
BUN: 16 mg/dL (ref 8–23)
CO2: 27 mmol/L (ref 22–32)
Calcium: 9.3 mg/dL (ref 8.9–10.3)
Chloride: 100 mmol/L (ref 98–111)
Creatinine, Ser: 1.04 mg/dL (ref 0.61–1.24)
GFR, Estimated: >60 mL/min (ref >60)
Glucose, Bld: 135 mg/dL — ABNORMAL HIGH (ref 70–99)
Potassium: 4 mmol/L (ref 3.5–5.1)
Sodium: 138 mmol/L (ref 135–145)
Total Bilirubin: 0.5 mg/dL (ref 0.3–1.2)
Total Protein: 7 g/dL (ref 6.5–8.1)

## 2022-10-27 LAB — URINALYSIS, ROUTINE W REFLEX MICROSCOPIC
Bilirubin Urine: NEGATIVE
Glucose, UA: NEGATIVE mg/dL
Hgb urine dipstick: NEGATIVE
Ketones, ur: NEGATIVE mg/dL
Leukocytes,Ua: NEGATIVE
Nitrite: NEGATIVE
Protein, ur: NEGATIVE mg/dL
Specific Gravity, Urine: 1.019 (ref 1.005–1.030)
pH: 5 (ref 5.0–8.0)

## 2022-10-27 LAB — CBC WITH DIFFERENTIAL/PLATELET
Abs Immature Granulocytes: 0.06 10*3/uL (ref 0.00–0.07)
Basophils Absolute: 0 10*3/uL (ref 0.0–0.1)
Basophils Relative: 0 %
Eosinophils Absolute: 0.1 10*3/uL (ref 0.0–0.5)
Eosinophils Relative: 1 %
HCT: 41.2 % (ref 39.0–52.0)
Hemoglobin: 12.8 g/dL — ABNORMAL LOW (ref 13.0–17.0)
Immature Granulocytes: 1 %
Lymphocytes Relative: 12 %
Lymphs Abs: 1.2 10*3/uL (ref 0.7–4.0)
MCH: 26 pg (ref 26.0–34.0)
MCHC: 31.1 g/dL (ref 30.0–36.0)
MCV: 83.6 fL (ref 80.0–100.0)
Monocytes Absolute: 0.5 10*3/uL (ref 0.1–1.0)
Monocytes Relative: 5 %
Neutro Abs: 8.7 10*3/uL — ABNORMAL HIGH (ref 1.7–7.7)
Neutrophils Relative %: 81 %
Platelets: 306 10*3/uL (ref 150–400)
RBC: 4.93 MIL/uL (ref 4.22–5.81)
RDW: 14.9 % (ref 11.5–15.5)
WBC: 10.5 10*3/uL (ref 4.0–10.5)
nRBC: 0 % (ref 0.0–0.2)

## 2022-10-27 LAB — LIPASE, BLOOD: Lipase: 34 U/L (ref 11–51)

## 2022-10-27 MED ORDER — GABAPENTIN 300 MG PO CAPS
900.0000 mg | ORAL_CAPSULE | Freq: Every day | ORAL | Status: DC
  Administered 2022-10-27 – 2022-11-01 (×6): 900 mg via ORAL
  Filled 2022-10-27 (×6): qty 3

## 2022-10-27 MED ORDER — ACETAMINOPHEN 500 MG PO TABS
1000.0000 mg | ORAL_TABLET | Freq: Three times a day (TID) | ORAL | Status: DC
  Administered 2022-10-27 – 2022-10-29 (×5): 1000 mg via ORAL
  Filled 2022-10-27 (×5): qty 2

## 2022-10-27 MED ORDER — HEPARIN SODIUM (PORCINE) 5000 UNIT/ML IJ SOLN
5000.0000 [IU] | Freq: Three times a day (TID) | INTRAMUSCULAR | Status: DC
  Administered 2022-10-27 – 2022-11-02 (×11): 5000 [IU] via SUBCUTANEOUS
  Filled 2022-10-27 (×13): qty 1

## 2022-10-27 MED ORDER — OXYCODONE-ACETAMINOPHEN 5-325 MG PO TABS
1.0000 | ORAL_TABLET | Freq: Once | ORAL | Status: AC
  Administered 2022-10-27: 1 via ORAL
  Filled 2022-10-27: qty 1

## 2022-10-27 MED ORDER — SODIUM CHLORIDE 0.9 % IV SOLN: INTRAVENOUS | Status: AC

## 2022-10-27 MED ORDER — ONDANSETRON HCL 4 MG PO TABS
4.0000 mg | ORAL_TABLET | Freq: Four times a day (QID) | ORAL | Status: DC | PRN
  Administered 2022-10-30 – 2022-11-01 (×2): 4 mg via ORAL
  Filled 2022-10-27 (×2): qty 1

## 2022-10-27 MED ORDER — ALIROCUMAB 150 MG/ML ~~LOC~~ SOAJ: 150.0000 mg | SUBCUTANEOUS | Status: DC

## 2022-10-27 MED ORDER — ALBUTEROL SULFATE (2.5 MG/3ML) 0.083% IN NEBU: 2.5000 mg | INHALATION_SOLUTION | RESPIRATORY_TRACT | Status: DC | PRN

## 2022-10-27 MED ORDER — ALLOPURINOL 300 MG PO TABS
300.0000 mg | ORAL_TABLET | Freq: Every day | ORAL | Status: DC
  Administered 2022-10-28 – 2022-11-02 (×6): 300 mg via ORAL
  Filled 2022-10-27 (×6): qty 1

## 2022-10-27 MED ORDER — ONDANSETRON HCL 4 MG/2ML IJ SOLN
4.0000 mg | Freq: Four times a day (QID) | INTRAMUSCULAR | Status: DC | PRN
  Administered 2022-10-28: 4 mg via INTRAVENOUS
  Filled 2022-10-27 (×2): qty 2

## 2022-10-27 MED ORDER — DOCUSATE SODIUM 100 MG PO CAPS: 100.0000 mg | ORAL_CAPSULE | Freq: Every day | ORAL | Status: DC | PRN

## 2022-10-27 MED ORDER — HYDROMORPHONE HCL 1 MG/ML IJ SOLN
0.5000 mg | Freq: Once | INTRAMUSCULAR | Status: AC
  Administered 2022-10-27: 0.5 mg via INTRAVENOUS
  Filled 2022-10-27: qty 1

## 2022-10-27 MED ORDER — IOHEXOL 350 MG/ML SOLN
75.0000 mL | Freq: Once | INTRAVENOUS | Status: AC | PRN
  Administered 2022-10-27: 75 mL via INTRAVENOUS

## 2022-10-27 MED ORDER — HYDROMORPHONE HCL 1 MG/ML IJ SOLN
2.0000 mg | Freq: Once | INTRAMUSCULAR | Status: AC
  Administered 2022-10-27: 2 mg via INTRAVENOUS
  Filled 2022-10-27: qty 2

## 2022-10-27 MED ORDER — HYDROMORPHONE HCL 1 MG/ML IJ SOLN
1.0000 mg | INTRAMUSCULAR | Status: DC | PRN
  Administered 2022-10-27 – 2022-10-28 (×5): 1 mg via INTRAVENOUS
  Filled 2022-10-27 (×5): qty 1

## 2022-10-27 MED ORDER — IOHEXOL 350 MG/ML SOLN
85.0000 mL | Freq: Once | INTRAVENOUS | Status: AC | PRN
  Administered 2022-10-27: 85 mL via INTRAVENOUS

## 2022-10-27 MED ORDER — HYDROMORPHONE HCL 1 MG/ML IJ SOLN
1.0000 mg | Freq: Once | INTRAMUSCULAR | Status: AC
  Administered 2022-10-27: 1 mg via INTRAVENOUS
  Filled 2022-10-27: qty 1

## 2022-10-27 MED ORDER — IRBESARTAN 300 MG PO TABS
300.0000 mg | ORAL_TABLET | Freq: Every day | ORAL | Status: DC
  Administered 2022-10-28 – 2022-11-02 (×6): 300 mg via ORAL
  Filled 2022-10-27 (×6): qty 1

## 2022-10-27 MED ORDER — PANTOPRAZOLE SODIUM 40 MG PO TBEC
40.0000 mg | DELAYED_RELEASE_TABLET | Freq: Two times a day (BID) | ORAL | Status: DC
  Administered 2022-10-27 – 2022-11-02 (×12): 40 mg via ORAL
  Filled 2022-10-27 (×12): qty 1

## 2022-10-27 NOTE — ED Provider Notes (Signed)
[facility] EMERGENCY DEPARTMENT Provider Note   CSN: [id number] Arrival date & time: 10/27/22  6073     History {Add pertinent medical, surgical, social history, OB history to HPI:1} Chief Complaint  Patient presents with   Abdominal Pain    Thomas [name] is a 71 y.o. male.   Abdominal Pain Patient presents with right-sided chest/abdominal pain.  Has had for months now.  States he is lost around 30 pounds and has been nauseous.  PCP is ordered CT scan but pain is uncontrolled.  No fevers.  No dysuria.  Pain not worse with eating.  Feels the pain is worse with breathing.  Does not necessarily feel short of breath however.  Did have hip replacement previously.  Is on tramadol for pain for the hip.    Past Medical History:  Diagnosis Date   Abdominal wall pain 04/02/2021   Acute ischemic stroke ([place]) 03/22/2020   Avulsed toenail, initial encounter 05/04/2018   Dyspnea on exertion 02/10/2020   ED (erectile dysfunction)    Epidermoid cyst of neck 10/16/2017   Essential hypertension 07/21/2017   GERD (gastroesophageal reflux disease)    Gout of multiple sites 07/21/2017   History of kidney stones    History of squamous cell carcinoma excision    Hyperlipidemia    Hypertension not at goal 01/19/2014   It band syndrome, left 03/19/2020   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/24/2020   Left ureteral calculus    Lower abdominal pain 08/27/2015   Metatarsalgia of both feet 03/19/2020   Multiple joint pain 07/16/2018   Pain in joint, shoulder region 12/11/2015   Right flank pain 04/02/2021   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 Authorizing Provider  Alirocumab (PRALUENT) 150 MG/ML SOAJ Inject 1 Pen into the skin every 14 (fourteen) days. 08/22/22    Park Liter, MD  allopurinol (ZYLOPRIM) 300 MG tablet Take 300 mg by mouth daily. 05/14/20   [provider]  amLODipine (NORVASC) 5 MG tablet Take 1 tablet (5 mg total) by mouth daily. 06/06/22   Liliane Shi, PA-C  aspirin EC 81 MG tablet Take 1 tablet (81 mg total) by mouth 2 (two) times daily. To be taken after surgery to prevent blood clots 06/22/22 06/22/23  Aundra Dubin, PA-C  diclofenac (VOLTAREN) 75 MG EC tablet Take 1 tablet (75 mg total) by mouth 2 (two) times daily with a meal. 10/03/22   Hudnall, Sharyn Lull, MD  docusate sodium (COLACE) 100 MG capsule Take 1 capsule (100 mg total) by mouth daily as needed. Patient taking differently: Take 100 mg by mouth daily as needed for mild constipation or moderate constipation. 06/22/22 06/22/23  Aundra Dubin, PA-C  gabapentin (NEURONTIN) 300 MG capsule Take 900 mg by mouth at bedtime.    [provider]  methocarbamol (ROBAXIN) 750 MG tablet Take 1 tablet (750 mg total) by mouth every 8 (eight) hours as needed for muscle spasms. 09/29/22   Hudnall, Sharyn Lull, MD  minoxidil (LONITEN) 10 MG tablet Take 1-1.5 tablets (10-15 mg total) by mouth 2 (two) times daily. TAKE 1 TABLET IN THE MORNING AND TAKE 1/2 TABLET AT NIGHT 09/12/22   Park Liter, MD  ondansetron (ZOFRAN) 4 MG tablet Take 1 tablet (4 mg total) by  mouth every 8 (eight) hours as needed for nausea or vomiting. 10/03/22   Hudnall, Sharyn Lull, MD  ondansetron (ZOFRAN-ODT) 4 MG disintegrating tablet Take 1 tablet (4 mg total) by mouth every 8 (eight) hours as needed for nausea or vomiting. 08/14/22   Leandrew Koyanagi, MD  pantoprazole (PROTONIX) 40 MG tablet TAKE 1 TABLET BY MOUTH TWICE A DAY Patient taking differently: Take 40 mg by mouth 2 (two) times daily. 08/04/22   Mansouraty, Telford Nab., MD  PRESCRIPTION MEDICATION Apply 1 Application topically at bedtime as needed (pain). Compounded pain cream ([place])    [provider]  traMADol (ULTRAM)  50 MG tablet Take 1-2 tablets (50-100 mg total) by mouth daily as needed. Patient taking differently: Take 50-100 mg by mouth daily as needed for moderate pain or severe pain. 07/07/22   Leandrew Koyanagi, MD  valsartan (DIOVAN) 320 MG tablet TAKE 1 TABLET BY MOUTH EVERY DAY Patient taking differently: Take 320 mg by mouth daily. 08/20/22   Park Liter, MD      Allergies    Statins    Review of Systems   Review of Systems  Gastrointestinal:  Positive for abdominal pain.    Physical Exam Updated Vital Signs BP (!) 180/67   Pulse 71   Temp 98.4 F (36.9 C)   Resp 12   Ht 5\' 11"  (1.803 m)   Wt 88.5 kg   SpO2 99%   BMI 27.20 kg/m  Physical Exam Vitals and nursing note reviewed.  Cardiovascular:     Rate and Rhythm: Normal rate and regular rhythm.  Pulmonary:     Breath sounds: Normal breath sounds.     Comments: Some tenderness right posterior chest wall. Chest:     Chest wall: Tenderness present.  Abdominal:     General: Abdomen is scaphoid.     Tenderness: There is no abdominal tenderness.  Skin:    General: Skin is warm.     Capillary Refill: Capillary refill takes less than 2 seconds.  Neurological:     Mental Status: He is alert and oriented to person, place, and time.     ED Results / Procedures / Treatments   Labs (all labs ordered are listed, but only abnormal results are displayed) Labs Reviewed  CBC WITH DIFFERENTIAL/PLATELET - Abnormal; Notable for the following components:      Result Value   Hemoglobin 12.8 (*)    Neutro Abs 8.7 (*)    All other components within normal limits  COMPREHENSIVE METABOLIC PANEL - Abnormal; Notable for the following components:   Glucose, Bld 135 (*)    All other components within normal limits  LIPASE, BLOOD  URINALYSIS, ROUTINE W REFLEX MICROSCOPIC    EKG None  Radiology CT Angio Abd/Pel W and/or Wo Contrast  Result Date: 10/27/2022 CLINICAL DATA:  Mesenteric ischemia, chronic EXAM: CTA ABDOMEN AND PELVIS  WITH CONTRAST TECHNIQUE: Multidetector CT imaging of the abdomen and pelvis was performed using the standard protocol during bolus administration of intravenous contrast. Multiplanar reconstructed images and MIPs were obtained and reviewed to evaluate the vascular anatomy. RADIATION DOSE REDUCTION: This exam was performed according to the departmental dose-optimization program which includes automated exposure control, adjustment of the mA and/or kV according to patient size and/or use of iterative reconstruction technique. CONTRAST:  65mL OMNIPAQUE IOHEXOL 350 MG/ML SOLN COMPARISON:  CT abdomen pelvis 10/27/2022 12:05 p.m., CT abdomen pelvis 01/14/2021 FINDINGS: VASCULAR Aorta: Slightly limited evaluation due to timing of contrast. Severe atherosclerotic  plaque. Narrowing of the infrarenal abdominal aorta caliber down to 1.1 cm at the level of the kidneys. Otherwise normal aorta without aneurysm, dissection, vasculitis. Celiac: Patent without evidence of aneurysm, dissection, vasculitis or significant stenosis. SMA: Patent without evidence of aneurysm, dissection, vasculitis or significant stenosis. Renals: Both renal arteries are patent without evidence of aneurysm, dissection, vasculitis, fibromuscular dysplasia or significant stenosis. IMA: Patent without evidence of aneurysm, dissection, vasculitis or significant stenosis. Inflow: Patent without evidence of aneurysm, dissection, vasculitis or significant stenosis. Proximal Outflow: Bilateral common femoral and visualized portions of the superficial and profunda femoral arteries are patent without evidence of aneurysm, dissection, vasculitis or significant stenosis. Veins: No portal venous or mesenteric venous gas. The main portal, splenic, superior mesenteric veins are patent. Review of the MIP images confirms the above findings. NON-VASCULAR Lower chest: Please see separately dictated CT angiography chest 10/27/2022. Hepatobiliary: There is a 1.2 cm right  hepatic lobe fluid density lesion that likely represents a hepatic cyst. Subcentimeter hypodensity too small to characterize no gallstones, gallbladder wall thickening, or pericholecystic fluid. No biliary dilatation. Pancreas: No focal lesion. Normal pancreatic contour. No surrounding inflammatory changes. No main pancreatic ductal dilatation. Spleen: Normal in size without focal abnormality. Adrenals/Urinary Tract: No adrenal nodule bilaterally.  Fluid Bilateral kidneys enhance symmetrically. Density lesions within the kidneys likely represent simple renal cysts. Simple renal cysts, in the absence of clinically indicated signs/symptoms, require no independent follow-up. Subcentimeter hypodensities are too small to characterize-no further follow-up indicated. No hydronephrosis. No hydroureter. The urinary bladder is unremarkable. There is no urothelial wall thickening and there are no filling defects in the opacified portions of the bilateral collecting systems or ureters. Stomach/Bowel: Stomach is within normal limits. No evidence of bowel wall thickening or dilatation. Colonic diverticulosis. Appendix appears normal. Lymphatic: No lymphadenopathy. Reproductive: The prostate is enlarged measuring up to 5 cm. Other: Surgical clips noted within the right lower quadrant. No intraperitoneal free fluid. No intraperitoneal free gas. No organized fluid collection. Musculoskeletal: Tiny fat containing umbilical hernia. Destructive lytic lesion of the right iliac with associated 5.7 x 4.6 cm soft tissue density. No acute displaced fracture. Multilevel degenerative changes of the spine. Total right hip arthroplasty. IMPRESSION: VASCULAR 1. Aortic Atherosclerosis (ICD10-I70.0) -severe leading to infrarenal abdominal aortic stenosis with caliber down to 1 cm. NON-VASCULAR 1. Colonic diverticulosis with no acute diverticulitis. 2. Prostatomegaly. 3. Destructive lytic lesion of the right iliac with associated 5.7 x 4.6 cm soft  tissue density. Finding concerning for malignancy/metastatic disease. 4. Please see separately CT angiography chest 10/27/2022 Electronically Signed   By: Iven Finn M.D.   On: 10/27/2022 20:10   CT Angio Chest PE W and/or Wo Contrast  Result Date: 10/27/2022 CLINICAL DATA:  Worsening right-sided pain, possible pulmonary embolus EXAM: CT ANGIOGRAPHY CHEST WITH CONTRAST TECHNIQUE: Multidetector CT imaging of the chest was performed using the standard protocol during bolus administration of intravenous contrast. Multiplanar CT image reconstructions and MIPs were obtained to evaluate the vascular anatomy. RADIATION DOSE REDUCTION: This exam was performed according to the departmental dose-optimization program which includes automated exposure control, adjustment of the mA and/or kV according to patient size and/or use of iterative reconstruction technique. CONTRAST:  74mL OMNIPAQUE IOHEXOL 350 MG/ML SOLN COMPARISON:  Chest radiograph 03/22/2020 FINDINGS: Cardiovascular: Reduced caliber of the right upper lobe pulmonary artery due to mass effect from surrounding tumor. No definite filling defect is identified in the pulmonary arterial tree to suggest pulmonary embolus. Coronary, aortic arch, and branch vessel atherosclerotic vascular disease. Mild  cardiomegaly. Mediastinum/Nodes: Right paratracheal node/mass 3.1 cm in short axis on image 49 series 5. Prevascular node 1.6 cm in short axis, image 48 series 5. Right hilar lymph node 2.5 cm in short axis, image 62 series 5. Additional indistinct prevascular lymph nodes noted on the right. Lungs/Pleura: There is a rind of density along the posterior pleural margin along the right lower lobe, suspicious for pleural tumor, about 1.9 cm in thickness on image 100 series 5. Adjacent erosion of the right ninth rib on image 42 series 10, with suspected tumor extension into the chest wall in the eighth intercostal space. Right upper lobe pulmonary nodule 1.8 by 1.2 cm on  image 51 series 6. Right lower lobe pleural-based nodule along the major fissure, 0.9 by 0.5 cm on image 84 series 6. Nodularity along the pleural margin of the right posterior diaphragm measuring 2.7 by 1.1 cm on image 73 series 10. Airway thickening is present, suggesting bronchitis or reactive airways disease. Calcified granuloma in the superior segment right lower lobe (benign). Mild perihilar ground-glass opacity in the right upper lobe is indistinct and likely inflammatory on image 50 series 6. Upper Abdomen: Hypodense 1.5 cm lesion in the right hepatic lobe on image 111 of series 5, fluid density and most likely to be a cyst. Thoracic spondylosis. Musculoskeletal: As noted above, the pleural mass on the right is associated with a erosion of the top of the right ninth rib and extension into the right eighth intercostal space compatible chest wall invasion. Review of the MIP images confirms the above findings. IMPRESSION: 1. No filling defect in the pulmonary arterial tree to suggest pulmonary embolus. Reduced caliber of the right upper lobe pulmonary artery due to mass effect from the surrounding tumor. 2. Pleural mass along the right lower lobe posteriorly with erosion of the right ninth rib and extension into the right eighth intercostal space compatible with chest wall invasion. 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 3. Right upper lobe pulmonary nodule 1.8 by 1.2 cm, suspicious for malignancy. 4. Right hilar, right paratracheal, and prevascular adenopathy compatible with malignancy. 5. Airway thickening is present, suggesting bronchitis or reactive airways disease. 6. Mild cardiomegaly. Coronary, aortic arch, and branch vessel atherosclerotic vascular disease. 7. Hypodense 1.5 cm lesion in the right hepatic lobe is fluid density and most likely to be a cyst. 8. Aortic atherosclerosis. 9. Mild perihilar ground-glass opacity in the right upper lobe is indistinct and likely inflammatory.  Aortic Atherosclerosis (ICD10-I70.0). Electronically Signed   By: Van Clines M.D.   On: 10/27/2022 19:57   CT Abdomen Pelvis W Contrast  Result Date: 10/27/2022 CLINICAL DATA:  Abdominal pain EXAM: CT ABDOMEN AND PELVIS WITH CONTRAST TECHNIQUE: Multidetector CT imaging of the abdomen and pelvis was performed using the standard protocol following bolus administration of intravenous contrast. RADIATION DOSE REDUCTION: This exam was performed according to the departmental dose-optimization program which includes automated exposure control, adjustment of the mA and/or kV according to patient size and/or use of iterative reconstruction technique. CONTRAST:  48mL OMNIPAQUE IOHEXOL 350 MG/ML SOLN COMPARISON:  Right upper quadrant ultrasound obtained the same day, hip radiographs 06/30/2022, pelvis radiographs 08/14/2022, lumbar spine MRI 04/14/2021 FINDINGS: Lower chest: There is soft tissue attenuating pleural thickening along the posterior right lower lobe (3-3) measuring up to 1.0 cm in thickness and 5.1 cm transverse. There is additional nodular pleural thickening along the medial aspect of the right hemidiaphragm measuring up to 2.7 cm x 1.0 cm (6-62). The imaged heart  is unremarkable. Hepatobiliary: A 1.5 cm hypodense lesion in the right hepatic lobe most likely reflects a benign cyst requiring no specific imaging follow-up. There are no other focal liver lesions. The gallbladder is unremarkable. There is no biliary ductal dilatation. Pancreas: Unremarkable. Spleen: Unremarkable. Adrenals/Urinary Tract: The adrenals are unremarkable. There are multiple hypodense lesions in both kidneys likely reflecting benign cysts requiring no specific imaging follow-up. There are no suspicious renal lesions. There are no stones in either kidney or along the course of either ureter. There is symmetric excretion of contrast into the collecting systems on the delayed images. The bladder is unremarkable. Stomach/Bowel:  The stomach is unremarkable. There is no evidence of bowel obstruction. There is extensive colonic diverticulosis. There is no convincing evidence of acute diverticulitis or primary colonic neoplasm. Appendix is normal. Vascular/Lymphatic: There is extensive mixed plaque in the nonaneurysmal abdominal aorta. 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 The major aortic branch vessels are patent. The main portal and splenic veins are patent. Reproductive: The prostate and seminal vesicles are grossly unremarkable though largely obscured by streak artifact from the right hip arthroplasty hardware. Other: There is no ascites or free air. There is a small fat containing umbilical hernia. Postsurgical changes are noted along the right ventral abdominal wall. Musculoskeletal: There is a destructive lytic lesion in the right iliac bone with soft tissue extension measuring up to 5.8 cm TV x 4.3 cm AP x 4.7 cm cc. This lesion was not present on the lumbar spine MRI from 04/14/2021. There is a prominent Schmorl's node indenting the inferior L2 endplate. There is no other aggressive or destructive osseous lesion. Right hip arthroplasty hardware is noted. IMPRESSION: 1. 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 2. Soft tissue attenuating pleural thickening along the posterior right lower lobe and along the medial aspect of the right hemidiaphragm are suspicious for pleural metastases. Recommend oncologic workup and dedicated CT chest with contrast. 3. Extensive colonic diverticulosis without evidence of acute diverticulitis. No convincing evidence of primary colonic neoplasm. 4. 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 Recommend vascular referral and nonemergent CTA abdomen/pelvis. Electronically Signed   By: Valetta Mole M.D.   On: 10/27/2022 12:40   US Abdomen Limited RUQ (LIVER/GB)  Result Date: 10/27/2022 CLINICAL DATA:  RIGHT upper quadrant pain EXAM: ULTRASOUND ABDOMEN LIMITED RIGHT UPPER QUADRANT COMPARISON:  February 2022 FINDINGS: Gallbladder: No gallstones or wall thickening visualized. No sonographic Murphy sign noted by sonographer. Common bile duct: Diameter: 5.5 mm, without intrahepatic biliary duct distension. Liver: Increased hepatic echogenicity without visible lesion on submitted images. Study limited by body habitus. Portal vein is patent on color Doppler imaging with normal direction of blood flow towards the liver. Other:  no ascites IMPRESSION: Hepatic steatosis without acute findings. Electronically Signed   By: Zetta Bills M.D.   On: 10/27/2022 10:34    Procedures Procedures  {Document cardiac monitor, telemetry assessment procedure when appropriate:1}  Medications Ordered in ED Medications  HYDROmorphone (DILAUDID) injection 2 mg (has no administration in time range)  oxyCODONE-acetaminophen (PERCOCET/ROXICET) 5-325 MG per tablet 1 tablet (1 tablet Oral Given 10/27/22 1008)  iohexol (OMNIPAQUE) 350 MG/ML injection 75 mL (75 mLs Intravenous Contrast  Given 10/27/22 1208)  HYDROmorphone (DILAUDID) injection 0.5 mg (0.5 mg Intravenous Given 10/27/22 1703)  HYDROmorphone (DILAUDID) injection 1 mg (1 mg Intravenous Given 10/27/22 1821)  iohexol (OMNIPAQUE) 350 MG/ML injection 85 mL (85 mLs Intravenous Contrast Given 10/27/22 1912)    ED Course/ Medical Decision Making/ A&P                           Medical Decision Making Amount and/or Complexity of Data Reviewed Radiology: ordered.  Risk Prescription drug management.   Patient presents with pain on the right flank/lower chest for around 9 weeks now.  Did have  previous hip replacement.  Has been seeing various doctors including sports medicine and pain medicine.  Has lost around 30 pounds.  Outpatient CT had been ordered.  Blood work reassuring.  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  Will get CT scan of the chest to evaluate for PE with likely malignancy.  Also will get angiogram of the abdomen pelvis since patient has had reported SMA narrowing in the past.  Although since this is not associate with eating I feel this is somewhat less likely.  Scans done and show likely malignancy.  Does have nodes and right pleural mass.  Also lung nodule.  Does erosion of the bone I think this is the cause of the pain.  Requiring IV narcotics for pain control.  I think he needs admission for pain control and hopefully expedited workup of the likely malignancy. Also has aortic narrowing in his abdomen.  I think this is unlikely because of the pain.    {Document critical care time when appropriate:1} {Document review of labs and clinical decision tools ie heart score, Chads2Vasc2 etc:1}  {Document your independent review of radiology images, and any outside records:1} {Document your discussion with family members, caretakers, and with consultants:1} {Document social determinants of health affecting pt's care:1} {Document your decision making why or why not admission, treatments were needed:1} Final Clinical Impression(s) / ED Diagnoses Final diagnoses:  None    Rx / DC Orders ED Discharge Orders     None

## 2022-10-27 NOTE — ED Triage Notes (Signed)
Pt arrived POV from home c/o RUQ abdominal pain that has been intermittent since Sept. Pt states he has saw his PCP about it and suppose to get a  CT on the 19th but states he cannot wait any longer. Pt endorses nausea.

## 2022-10-27 NOTE — ED Provider Triage Note (Signed)
Emergency Medicine Provider Triage Evaluation Note  Thomas [name] , a 71 y.o. male  was evaluated in triage.  Pt complains of worsening right-sided/epigastric pain over the last 6 to 8 weeks.  PCP scheduled a CT abdomen and pelvis for the 19th, however patient states the pain has become unbearable.  With nausea, however without vomiting, diarrhea, constipation, or urinary symptoms.  States the pain wraps around the right flank and sometimes to the right shoulder blade.  Denies chest pain or shortness of breath.  Denies fevers or chills.  Hx of prior renal calculi and prior hernia repair, patient states this feels different.  Review of Systems  Positive:  Negative: See above  Physical Exam  BP (!) 192/67 (BP Location: Right Arm)   Pulse 95   Temp 98 F (36.7 C)   Resp 16   Ht 5\' 11"  (1.803 m)   Wt 88.5 kg   SpO2 98%   BMI 27.20 kg/m  Gen:   Awake, no distress, appears uncomfortable, fidgeting in chair Resp:  Normal effort  MSK:   Moves extremities without difficulty  Other:  Right-sided abdominal tenderness, epigastric tenderness.  Without peritonitis.  Chest non-TTP.  Gait grossly intact.  Medical Decision Making  Medically screening exam initiated at 9:52 AM.  Appropriate orders placed.  Thomas [name] was informed that the remainder of the evaluation will be completed by another provider, this initial triage assessment does not replace that evaluation, and the importance of remaining in the ED until their evaluation is complete.  Labs and imaging ordered.  Percocet ordered.   Prince Rome, PA-C 19/50/93 1001

## 2022-10-27 NOTE — H&P (Signed)
History and Physical    Thomas [name] ERD:[id number] DOB: 25-Aug-1951 DOA: 10/27/2022  PCP: Michael Boston, MD  Patient coming from: home  I have personally briefly reviewed patient's old medical records in [place]  Chief Complaint: persistent RUQ abdominal pain since 9/23  HPI: Thomas [name] is a 71 y.o. male with medical history significant of  CVA, HTN,GERD, Gout, HLD ,superior mesenteric artery stenosis who presents to ED with history of progressive RUQ abdominal pain since 9/23. Patient has followed up with pcp and was undergoing outpatient evaluation with CT scan pending however, due to increasing pain presented to ED for further evaluation. Patient notes no associated fever /chills but does note weight loss non-intentional weight loss over the last 2 months. He describes pain as a soreness as well as sharp with radiation to his back.  He denies sob/chest pain / n/v/d/dysuria. He states iv medication helps with pain, however currently pain is 8/10   ED Course:  Vitals  Afebrile, bp192/67, hr 95, rr 16 , sat 98%  Wbc : 10.5, K 12.8, plt 306,   Na 138, K 4, glu 135, cr 1.04  Lipase 34  UA Neg  RUQ Hepatic steatosis without acute findings.   Tx percocet  CT Abd /pelvis  1. 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 2. Soft tissue attenuating pleural thickening along the posterior right lower lobe and along the medial aspect of the right hemidiaphragm are suspicious for pleural metastases. Recommend oncologic workup and dedicated CT chest with contrast. 3. Extensive colonic diverticulosis without evidence of acute diverticulitis. No convincing evidence of primary colonic neoplasm. 4. 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 Recommend vascular referral and nonemergent CTA abdomen/pelvis.   Review of Systems: As per HPI otherwise 10 point review of systems negative.   Past Medical History:  Diagnosis Date   Abdominal wall pain 04/02/2021   Acute ischemic stroke ([place]) 03/22/2020   Avulsed toenail, initial encounter 05/04/2018   Dyspnea on exertion 02/10/2020   ED (erectile dysfunction)    Epidermoid cyst of neck 10/16/2017   Essential hypertension 07/21/2017   GERD (gastroesophageal reflux disease)    Gout of multiple sites 07/21/2017   History of kidney stones    History of squamous cell carcinoma excision    Hyperlipidemia    Hypertension not at goal 01/19/2014   It band syndrome, left 03/19/2020   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/24/2020   Left ureteral calculus    Lower abdominal pain 08/27/2015   Metatarsalgia of both feet 03/19/2020   Multiple joint pain 07/16/2018   Pain in joint, shoulder region 12/11/2015   Right flank pain 04/02/2021   Superior mesenteric artery stenosis ([place]) 02/14/2020   Urgency of urination    URI (upper respiratory infection) 11/08/2014   Wears glasses     Past Surgical History:  Procedure Laterality Date   COLONOSCOPY WITH PROPOFOL  2017   CYSTOSCOPY/RETROGRADE/URETEROSCOPY/STONE EXTRACTION WITH BASKET Left 04/16/2018   Procedure: CYSTOSCOPY/RETROGRADE/URETEROSCOPY/STONE EXTRACTION WITH BASKET/ HOLMIUM LASER LITHOTRIPSY/ STENT PLACEMENT;  Surgeon: Ardis Hughs, MD;  Location: [facility];  Service: Urology;  Laterality: Left;   HERNIA REPAIR Right 2022   inguinal   HOLMIUM LASER APPLICATION Left 56/31/4970   Procedure: HOLMIUM LASER  APPLICATION;  Surgeon: Ardis Hughs, MD;  Location: [facility];  Service: Urology;  Laterality: Left;   TOTAL HIP ARTHROPLASTY Right 06/30/2022   Procedure: RIGHT TOTAL HIP ARTHROPLASTY ANTERIOR APPROACH;  Surgeon: Leandrew Koyanagi, MD;   Location: [facility];  Service: Orthopedics;  Laterality: Right;  3-C     reports that he quit smoking about 28 years ago. His smoking use included cigarettes. He has a 6.00 pack-year smoking history. He has never used smokeless tobacco. He reports that he does not currently use alcohol. He reports that he does not use drugs.  Allergies  Allergen Reactions   Statins     Jaw tightness and severe muscle pain- Pravastatin     Family History  Problem Relation Age of Onset   Heart disease Mother 74   Alcohol abuse Mother    Depression Mother    Hypertension Father    Sleep apnea Father    Cancer Sister        hodgkins, breast   Colon cancer Neg Hx    Pancreatic cancer Neg Hx    Rectal cancer Neg Hx    Stomach cancer Neg Hx    Esophageal cancer Neg Hx    Inflammatory bowel disease Neg Hx    Liver disease Neg Hx     Prior to Admission medications   Medication Sig Start Date End Date Taking? Authorizing Provider  Alirocumab (PRALUENT) 150 MG/ML SOAJ Inject 1 Pen into the skin every 14 (fourteen) days. 08/22/22   Park Liter, MD  allopurinol (ZYLOPRIM) 300 MG tablet Take 300 mg by mouth daily. 05/14/20   [provider]  amLODipine (NORVASC) 5 MG tablet Take 1 tablet (5 mg total) by mouth daily. 06/06/22   Liliane Shi, PA-C  aspirin EC 81 MG tablet Take 1 tablet (81 mg total) by mouth 2 (two) times daily. To be taken after surgery to prevent blood clots 06/22/22 06/22/23  Aundra Dubin, PA-C  diclofenac (VOLTAREN) 75 MG EC tablet Take 1 tablet (75 mg total) by mouth 2 (two) times daily with a meal. 10/03/22   Hudnall, Sharyn Lull, MD  docusate sodium (COLACE) 100 MG capsule Take 1 capsule (100 mg total) by mouth daily as needed. Patient taking differently: Take 100 mg by mouth daily as needed for mild constipation or moderate constipation. 06/22/22 06/22/23  Aundra Dubin, PA-C  gabapentin (NEURONTIN) 300 MG capsule Take 900 mg by mouth at bedtime.    [provider]   methocarbamol (ROBAXIN) 750 MG tablet Take 1 tablet (750 mg total) by mouth every 8 (eight) hours as needed for muscle spasms. 09/29/22   Hudnall, Sharyn Lull, MD  minoxidil (LONITEN) 10 MG tablet Take 1-1.5 tablets (10-15 mg total) by mouth 2 (two) times daily. TAKE 1 TABLET IN THE MORNING AND TAKE 1/2 TABLET AT NIGHT 09/12/22   Park Liter, MD  ondansetron (ZOFRAN) 4 MG tablet Take 1 tablet (4 mg total) by mouth every 8 (eight) hours as needed for nausea or vomiting. 10/03/22   Hudnall, Sharyn Lull, MD  ondansetron (ZOFRAN-ODT) 4 MG disintegrating tablet Take 1 tablet (4 mg total) by mouth every 8 (eight) hours as needed for nausea or vomiting. 08/14/22   Leandrew Koyanagi, MD  pantoprazole (PROTONIX) 40 MG tablet TAKE 1 TABLET BY MOUTH TWICE A DAY Patient taking differently: Take 40 mg by mouth 2 (two) times daily. 08/04/22   Mansouraty, Telford Nab., MD  PRESCRIPTION MEDICATION Apply 1 Application topically  at bedtime as needed (pain). Compounded pain cream ([place])    [provider]  traMADol (ULTRAM) 50 MG tablet Take 1-2 tablets (50-100 mg total) by mouth daily as needed. Patient taking differently: Take 50-100 mg by mouth daily as needed for moderate pain or severe pain. 07/07/22   Leandrew Koyanagi, MD  valsartan (DIOVAN) 320 MG tablet TAKE 1 TABLET BY MOUTH EVERY DAY Patient taking differently: Take 320 mg by mouth daily. 08/20/22   Park Liter, MD    Physical Exam: Vitals:   10/27/22 0933 10/27/22 0942 10/27/22 1306  BP: (!) 192/67  (!) 180/67  Pulse: 95  71  Resp: 16  12  Temp: 98 F (36.7 C)  98.4 F (36.9 C)  SpO2: 98%  99%  Weight:  88.5 kg   Height:  5\' 11"  (1.803 m)     Constitutional: NAD, calm, comfortable Vitals:   10/27/22 0933 10/27/22 0942 10/27/22 1306  BP: (!) 192/67  (!) 180/67  Pulse: 95  71  Resp: 16  12  Temp: 98 F (36.7 C)  98.4 F (36.9 C)  SpO2: 98%  99%  Weight:  88.5 kg   Height:  5\' 11"  (1.803 m)    Eyes: PERRL, lids and  conjunctivae normal ENMT: Mucous membranes are moist. Posterior pharynx clear of any exudate or lesions.Normal dentition.  Neck: normal, supple, no masses, no thyromegaly Respiratory: clear to auscultation bilaterally, no wheezing, no crackles. Normal respiratory effort. No accessory muscle use.  Cardiovascular: Regular rate and rhythm, no murmurs / rubs / gallops. No extremity edema. 2+ pedal pulses.  Abdomen: + tenderness RUQ, no masses palpated. No hepatosplenomegaly. Bowel sounds positive.  Musculoskeletal: no clubbing / cyanosis. No joint deformity upper and lower extremities. Good ROM, no contractures. Normal muscle tone.  Skin: no rashes, lesions, ulcers. No induration Neurologic: CN 2-12 grossly intact. Sensation intact,  Strength 5/5 in all 4.  Psychiatric: Normal judgment and insight. Alert and oriented x 3. Normal mood.    Labs on Admission: I have personally reviewed following labs and imaging studies  CBC: Recent Labs  Lab 10/27/22 1002  WBC 10.5  NEUTROABS 8.7*  HGB 12.8*  HCT 41.2  MCV 83.6  PLT 329   Basic Metabolic Panel: Recent Labs  Lab 10/27/22 1002  NA 138  K 4.0  CL 100  CO2 27  GLUCOSE 135*  BUN 16  CREATININE 1.04  CALCIUM 9.3   GFR: Estimated Creatinine Clearance: 71.4 mL/min (by C-G formula based on SCr of 1.04 mg/dL). Liver Function Tests: Recent Labs  Lab 10/27/22 1002  AST 17  ALT 17  ALKPHOS 104  BILITOT 0.5  PROT 7.0  ALBUMIN 4.1   Recent Labs  Lab 10/27/22 1002  LIPASE 34   No results for input(s): "AMMONIA" in the last 168 hours. Coagulation Profile: No results for input(s): "INR", "PROTIME" in the last 168 hours. Cardiac Enzymes: No results for input(s): "CKTOTAL", "CKMB", "CKMBINDEX", "TROPONINI" in the last 168 hours. BNP (last 3 results) No results for input(s): "PROBNP" in the last 8760 hours. HbA1C: No results for input(s): "HGBA1C" in the last 72 hours. CBG: No results for input(s): "GLUCAP" in the last 168  hours. Lipid Profile: No results for input(s): "CHOL", "HDL", "LDLCALC", "TRIG", "CHOLHDL", "LDLDIRECT" in the last 72 hours. Thyroid Function Tests: No results for input(s): "TSH", "T4TOTAL", "FREET4", "T3FREE", "THYROIDAB" in the last 72 hours. Anemia Panel: No results for input(s): "VITAMINB12", "FOLATE", "FERRITIN", "TIBC", "IRON", "RETICCTPCT" in the last  72 hours. Urine analysis:    Component Value Date/Time   COLORURINE YELLOW 10/27/2022 [place] 10/27/2022 1016   LABSPEC 1.019 10/27/2022 1016   PHURINE 5.0 10/27/2022 1016   GLUCOSEU NEGATIVE 10/27/2022 1016   HGBUR NEGATIVE 10/27/2022 1016   BILIRUBINUR NEGATIVE 10/27/2022 1016   BILIRUBINUR neg 08/27/2015 1103   KETONESUR NEGATIVE 10/27/2022 1016   PROTEINUR NEGATIVE 10/27/2022 1016   UROBILINOGEN 0.2 08/27/2015 1103   NITRITE NEGATIVE 10/27/2022 1016   LEUKOCYTESUR NEGATIVE 10/27/2022 1016    Radiological Exams on Admission: CT Angio Abd/Pel W and/or Wo Contrast  Result Date: 10/27/2022 CLINICAL DATA:  Mesenteric ischemia, chronic EXAM: CTA ABDOMEN AND PELVIS WITH CONTRAST TECHNIQUE: Multidetector CT imaging of the abdomen and pelvis was performed using the standard protocol during bolus administration of intravenous contrast. Multiplanar reconstructed images and MIPs were obtained and reviewed to evaluate the vascular anatomy. RADIATION DOSE REDUCTION: This exam was performed according to the departmental dose-optimization program which includes automated exposure control, adjustment of the mA and/or kV according to patient size and/or use of iterative reconstruction technique. CONTRAST:  45mL OMNIPAQUE IOHEXOL 350 MG/ML SOLN COMPARISON:  CT abdomen pelvis 10/27/2022 12:05 p.m., CT abdomen pelvis 01/14/2021 FINDINGS: VASCULAR Aorta: Slightly limited evaluation due to timing of contrast. Severe atherosclerotic plaque. Narrowing of the infrarenal abdominal aorta caliber down to 1.1 cm at the level of the  kidneys. Otherwise normal aorta without aneurysm, dissection, vasculitis. Celiac: Patent without evidence of aneurysm, dissection, vasculitis or significant stenosis. SMA: Patent without evidence of aneurysm, dissection, vasculitis or significant stenosis. Renals: Both renal arteries are patent without evidence of aneurysm, dissection, vasculitis, fibromuscular dysplasia or significant stenosis. IMA: Patent without evidence of aneurysm, dissection, vasculitis or significant stenosis. Inflow: Patent without evidence of aneurysm, dissection, vasculitis or significant stenosis. Proximal Outflow: Bilateral common femoral and visualized portions of the superficial and profunda femoral arteries are patent without evidence of aneurysm, dissection, vasculitis or significant stenosis. Veins: No portal venous or mesenteric venous gas. The main portal, splenic, superior mesenteric veins are patent. Review of the MIP images confirms the above findings. NON-VASCULAR Lower chest: Please see separately dictated CT angiography chest 10/27/2022. Hepatobiliary: There is a 1.2 cm right hepatic lobe fluid density lesion that likely represents a hepatic cyst. Subcentimeter hypodensity too small to characterize no gallstones, gallbladder wall thickening, or pericholecystic fluid. No biliary dilatation. Pancreas: No focal lesion. Normal pancreatic contour. No surrounding inflammatory changes. No main pancreatic ductal dilatation. Spleen: Normal in size without focal abnormality. Adrenals/Urinary Tract: No adrenal nodule bilaterally.  Fluid Bilateral kidneys enhance symmetrically. Density lesions within the kidneys likely represent simple renal cysts. Simple renal cysts, in the absence of clinically indicated signs/symptoms, require no independent follow-up. Subcentimeter hypodensities are too small to characterize-no further follow-up indicated. No hydronephrosis. No hydroureter. The urinary bladder is unremarkable. There is no urothelial  wall thickening and there are no filling defects in the opacified portions of the bilateral collecting systems or ureters. Stomach/Bowel: Stomach is within normal limits. No evidence of bowel wall thickening or dilatation. Colonic diverticulosis. Appendix appears normal. Lymphatic: No lymphadenopathy. Reproductive: The prostate is enlarged measuring up to 5 cm. Other: Surgical clips noted within the right lower quadrant. No intraperitoneal free fluid. No intraperitoneal free gas. No organized fluid collection. Musculoskeletal: Tiny fat containing umbilical hernia. Destructive lytic lesion of the right iliac with associated 5.7 x 4.6 cm soft tissue density. No acute displaced fracture. Multilevel degenerative changes of the spine. Total right hip arthroplasty. IMPRESSION: VASCULAR 1.  Aortic Atherosclerosis (ICD10-I70.0) -severe leading to infrarenal abdominal aortic stenosis with caliber down to 1 cm. NON-VASCULAR 1. Colonic diverticulosis with no acute diverticulitis. 2. Prostatomegaly. 3. Destructive lytic lesion of the right iliac with associated 5.7 x 4.6 cm soft tissue density. Finding concerning for malignancy/metastatic disease. 4. Please see separately CT angiography chest 10/27/2022 Electronically Signed   By: Iven Finn M.D.   On: 10/27/2022 20:10   CT Angio Chest PE W and/or Wo Contrast  Result Date: 10/27/2022 CLINICAL DATA:  Worsening right-sided pain, possible pulmonary embolus EXAM: CT ANGIOGRAPHY CHEST WITH CONTRAST TECHNIQUE: Multidetector CT imaging of the chest was performed using the standard protocol during bolus administration of intravenous contrast. Multiplanar CT image reconstructions and MIPs were obtained to evaluate the vascular anatomy. RADIATION DOSE REDUCTION: This exam was performed according to the departmental dose-optimization program which includes automated exposure control, adjustment of the mA and/or kV according to patient size and/or use of iterative reconstruction  technique. CONTRAST:  70mL OMNIPAQUE IOHEXOL 350 MG/ML SOLN COMPARISON:  Chest radiograph 03/22/2020 FINDINGS: Cardiovascular: Reduced caliber of the right upper lobe pulmonary artery due to mass effect from surrounding tumor. No definite filling defect is identified in the pulmonary arterial tree to suggest pulmonary embolus. Coronary, aortic arch, and branch vessel atherosclerotic vascular disease. Mild cardiomegaly. Mediastinum/Nodes: Right paratracheal node/mass 3.1 cm in short axis on image 49 series 5. Prevascular node 1.6 cm in short axis, image 48 series 5. Right hilar lymph node 2.5 cm in short axis, image 62 series 5. Additional indistinct prevascular lymph nodes noted on the right. Lungs/Pleura: There is a rind of density along the posterior pleural margin along the right lower lobe, suspicious for pleural tumor, about 1.9 cm in thickness on image 100 series 5. Adjacent erosion of the right ninth rib on image 42 series 10, with suspected tumor extension into the chest wall in the eighth intercostal space. Right upper lobe pulmonary nodule 1.8 by 1.2 cm on image 51 series 6. Right lower lobe pleural-based nodule along the major fissure, 0.9 by 0.5 cm on image 84 series 6. Nodularity along the pleural margin of the right posterior diaphragm measuring 2.7 by 1.1 cm on image 73 series 10. Airway thickening is present, suggesting bronchitis or reactive airways disease. Calcified granuloma in the superior segment right lower lobe (benign). Mild perihilar ground-glass opacity in the right upper lobe is indistinct and likely inflammatory on image 50 series 6. Upper Abdomen: Hypodense 1.5 cm lesion in the right hepatic lobe on image 111 of series 5, fluid density and most likely to be a cyst. Thoracic spondylosis. Musculoskeletal: As noted above, the pleural mass on the right is associated with a erosion of the top of the right ninth rib and extension into the right eighth intercostal space compatible chest wall  invasion. Review of the MIP images confirms the above findings. IMPRESSION: 1. No filling defect in the pulmonary arterial tree to suggest pulmonary embolus. Reduced caliber of the right upper lobe pulmonary artery due to mass effect from the surrounding tumor. 2. Pleural mass along the right lower lobe posteriorly with erosion of the right ninth rib and extension into the right eighth intercostal space compatible with chest wall invasion. 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 3. Right upper lobe pulmonary nodule 1.8 by 1.2 cm, suspicious for malignancy. 4. Right hilar, right paratracheal, and prevascular adenopathy compatible with malignancy. 5. Airway thickening is present, suggesting bronchitis or reactive airways disease. 6. Mild cardiomegaly. Coronary, aortic arch,  and branch vessel atherosclerotic vascular disease. 7. Hypodense 1.5 cm lesion in the right hepatic lobe is fluid density and most likely to be a cyst. 8. Aortic atherosclerosis. 9. Mild perihilar ground-glass opacity in the right upper lobe is indistinct and likely inflammatory. Aortic Atherosclerosis (ICD10-I70.0). Electronically Signed   By: Van Clines M.D.   On: 10/27/2022 19:57   CT Abdomen Pelvis W Contrast  Result Date: 10/27/2022 CLINICAL DATA:  Abdominal pain EXAM: CT ABDOMEN AND PELVIS WITH CONTRAST TECHNIQUE: Multidetector CT imaging of the abdomen and pelvis was performed using the standard protocol following bolus administration of intravenous contrast. RADIATION DOSE REDUCTION: This exam was performed according to the departmental dose-optimization program which includes automated exposure control, adjustment of the mA and/or kV according to patient size and/or use of iterative reconstruction technique. CONTRAST:  29mL OMNIPAQUE IOHEXOL 350 MG/ML SOLN COMPARISON:  Right upper quadrant ultrasound obtained the same day, hip radiographs 06/30/2022, pelvis radiographs 08/14/2022, lumbar spine MRI 04/14/2021  FINDINGS: Lower chest: There is soft tissue attenuating pleural thickening along the posterior right lower lobe (3-3) measuring up to 1.0 cm in thickness and 5.1 cm transverse. There is additional nodular pleural thickening along the medial aspect of the right hemidiaphragm measuring up to 2.7 cm x 1.0 cm (6-62). The imaged heart is unremarkable. Hepatobiliary: A 1.5 cm hypodense lesion in the right hepatic lobe most likely reflects a benign cyst requiring no specific imaging follow-up. There are no other focal liver lesions. The gallbladder is unremarkable. There is no biliary ductal dilatation. Pancreas: Unremarkable. Spleen: Unremarkable. Adrenals/Urinary Tract: The adrenals are unremarkable. There are multiple hypodense lesions in both kidneys likely reflecting benign cysts requiring no specific imaging follow-up. There are no suspicious renal lesions. There are no stones in either kidney or along the course of either ureter. There is symmetric excretion of contrast into the collecting systems on the delayed images. The bladder is unremarkable. Stomach/Bowel: The stomach is unremarkable. There is no evidence of bowel obstruction. There is extensive colonic diverticulosis. There is no convincing evidence of acute diverticulitis or primary colonic neoplasm. Appendix is normal. Vascular/Lymphatic: There is extensive mixed plaque in the nonaneurysmal abdominal aorta. 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 The major aortic branch vessels are patent. The main portal and splenic veins are patent. Reproductive: The prostate and seminal vesicles are grossly unremarkable though largely obscured by streak artifact from the right hip arthroplasty hardware. Other: There is no ascites or free air. There is a small fat containing umbilical hernia. Postsurgical changes are noted along the right ventral abdominal wall.  Musculoskeletal: There is a destructive lytic lesion in the right iliac bone with soft tissue extension measuring up to 5.8 cm TV x 4.3 cm AP x 4.7 cm cc. This lesion was not present on the lumbar spine MRI from 04/14/2021. There is a prominent Schmorl's node indenting the inferior L2 endplate. There is no other aggressive or destructive osseous lesion. Right hip arthroplasty hardware is noted. IMPRESSION: 1. 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 2. Soft tissue attenuating pleural thickening along the posterior right lower lobe and along the medial aspect of the right hemidiaphragm are suspicious for pleural metastases. Recommend oncologic workup and dedicated CT chest with contrast. 3. Extensive colonic diverticulosis without evidence of acute diverticulitis. No convincing  evidence of primary colonic neoplasm. 4. 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 Recommend vascular referral and nonemergent CTA abdomen/pelvis. Electronically Signed   By: Valetta Mole M.D.   On: 10/27/2022 12:40   US Abdomen Limited RUQ (LIVER/GB)  Result Date: 10/27/2022 CLINICAL DATA:  RIGHT upper quadrant pain EXAM: ULTRASOUND ABDOMEN LIMITED RIGHT UPPER QUADRANT COMPARISON:  February 2022 FINDINGS: Gallbladder: No gallstones or wall thickening visualized. No sonographic Murphy sign noted by sonographer. Common bile duct: Diameter: 5.5 mm, without intrahepatic biliary duct distension. Liver: Increased hepatic echogenicity without visible lesion on submitted images. Study limited by body habitus. Portal vein is patent on color Doppler imaging with normal direction of blood flow towards the liver. Other:  no ascites IMPRESSION: Hepatic steatosis without  acute findings. Electronically Signed   By: Zetta Bills M.D.   On: 10/27/2022 10:34    EKG: Independently reviewed. N/A  Assessment/Plan  New Destructive lytic lesion in the right iliac bone concern for Metastatic disease unknown primary   -associated  with soft tissue extension measuring up to 5.8 cm -not seen on lumbar spine MRI from 04/14/2021 -highly suspicious for metastatic disease. 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  -with associated flow-limiting stenosis  -extensive mixed plaque in the nonaneurysmal abdominal aorta  - CTA of abd /pelvis pending  -continue on anti platelet therapy  -vascular consult for further assistance    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  - continue on gabapentin    HTN -stable resume home regimen   GERD -ppi    Gout -stable on current flar e -check baseline uric acid  - continue allopurinol    HLD  -continue statin   Superior mesenteric artery stenosis  -previously known    DVT prophylaxis:  heparin Code Status: full Family Communication: none at bedside Disposition Plan: patient  expected to be admitted greater than 2 midnights  Consults called: vascular([facility])/oncology Feng Admission status: med Karen Chafe   Clance Boll MD [facility]  If 7PM-7AM, please contact night-coverage [email or web address] Password [email or web address]  10/27/2022, 9:11 PM

## 2022-10-28 ENCOUNTER — Inpatient Hospital Stay (HOSPITAL_COMMUNITY): Payer: Medicare HMO

## 2022-10-28 DIAGNOSIS — I719 Aortic aneurysm of unspecified site, without rupture: Secondary | ICD-10-CM | POA: Insufficient documentation

## 2022-10-28 DIAGNOSIS — R52 Pain, unspecified: Secondary | ICD-10-CM | POA: Diagnosis not present

## 2022-10-28 DIAGNOSIS — M898X5 Other specified disorders of bone, thigh: Secondary | ICD-10-CM | POA: Insufficient documentation

## 2022-10-28 LAB — COMPREHENSIVE METABOLIC PANEL
ALT: 15 U/L (ref 0–44)
AST: 14 U/L — ABNORMAL LOW (ref 15–41)
Albumin: 3.5 g/dL (ref 3.5–5.0)
Alkaline Phosphatase: 93 U/L (ref 38–126)
Anion gap: 8 (ref 5–15)
BUN: 13 mg/dL (ref 8–23)
CO2: 27 mmol/L (ref 22–32)
Calcium: 8.8 mg/dL — ABNORMAL LOW (ref 8.9–10.3)
Chloride: 104 mmol/L (ref 98–111)
Creatinine, Ser: 0.94 mg/dL (ref 0.61–1.24)
GFR, Estimated: >60 mL/min (ref >60)
Glucose, Bld: 102 mg/dL — ABNORMAL HIGH (ref 70–99)
Potassium: 4 mmol/L (ref 3.5–5.1)
Sodium: 139 mmol/L (ref 135–145)
Total Bilirubin: 0.5 mg/dL (ref 0.3–1.2)
Total Protein: 6.3 g/dL — ABNORMAL LOW (ref 6.5–8.1)

## 2022-10-28 LAB — CBC
HCT: 36.6 % — ABNORMAL LOW (ref 39.0–52.0)
Hemoglobin: 11.8 g/dL — ABNORMAL LOW (ref 13.0–17.0)
MCH: 26.3 pg (ref 26.0–34.0)
MCHC: 32.2 g/dL (ref 30.0–36.0)
MCV: 81.5 fL (ref 80.0–100.0)
Platelets: 285 10*3/uL (ref 150–400)
RBC: 4.49 MIL/uL (ref 4.22–5.81)
RDW: 14.8 % (ref 11.5–15.5)
WBC: 9.6 10*3/uL (ref 4.0–10.5)
nRBC: 0 % (ref 0.0–0.2)

## 2022-10-28 LAB — HIV ANTIBODY (ROUTINE TESTING W REFLEX): HIV Screen 4th Generation wRfx: NONREACTIVE

## 2022-10-28 MED ORDER — HYDROMORPHONE HCL 1 MG/ML IJ SOLN
2.0000 mg | INTRAMUSCULAR | Status: DC | PRN
  Administered 2022-10-28 – 2022-10-29 (×7): 2 mg via INTRAVENOUS
  Filled 2022-10-28 (×7): qty 2

## 2022-10-28 MED ORDER — IOHEXOL 350 MG/ML SOLN
75.0000 mL | Freq: Once | INTRAVENOUS | Status: AC | PRN
  Administered 2022-10-28: 75 mL via INTRAVENOUS

## 2022-10-28 MED ORDER — HYDROCODONE-ACETAMINOPHEN 5-325 MG PO TABS
1.0000 | ORAL_TABLET | Freq: Four times a day (QID) | ORAL | Status: DC | PRN
  Administered 2022-10-28: 1 via ORAL
  Filled 2022-10-28: qty 1

## 2022-10-28 MED ORDER — AMLODIPINE BESYLATE 5 MG PO TABS
5.0000 mg | ORAL_TABLET | Freq: Every day | ORAL | Status: DC
  Administered 2022-10-28 – 2022-11-02 (×6): 5 mg via ORAL
  Filled 2022-10-28 (×6): qty 1

## 2022-10-28 MED ORDER — HYDROCODONE-ACETAMINOPHEN 5-325 MG PO TABS
1.0000 | ORAL_TABLET | Freq: Two times a day (BID) | ORAL | Status: DC | PRN
  Administered 2022-10-28: 1 via ORAL
  Filled 2022-10-28: qty 1

## 2022-10-28 MED ORDER — ENSURE ENLIVE PO LIQD: 237.0000 mL | Freq: Two times a day (BID) | ORAL | Status: DC

## 2022-10-28 MED ORDER — METHOCARBAMOL 500 MG PO TABS
750.0000 mg | ORAL_TABLET | Freq: Three times a day (TID) | ORAL | Status: DC | PRN
  Administered 2022-10-28: 750 mg via ORAL
  Filled 2022-10-28: qty 1

## 2022-10-28 NOTE — Progress Notes (Signed)
Initial Nutrition Assessment  DOCUMENTATION CODES:   Not applicable  INTERVENTION:  Liberalize diet from a carb modified to a regular diet to provide widest variety of menu options to enhance nutritional adequacy Ensure Enlive po BID, each supplement provides 350 kcal and 20 grams of protein. Snacks TID to optimize PO intake  NUTRITION DIAGNOSIS:   Increased nutrient needs related to acute illness as evidenced by estimated needs.  GOAL:   Patient will meet greater than or equal to 90% of their needs  MONITOR:   PO intake, Supplement acceptance, Weight trends, TF tolerance  REASON FOR ASSESSMENT:   Malnutrition Screening Tool    ASSESSMENT:   Pt admitted with intractable RUQ abdominal pain. PMH significant for CVA, HTN, GERD, gout, HLD, superior mesenteric artery stenosis  Pt noted to have concerns for metastatic disease. Pending Oncology consult.   Spoke with pt and his family present at bedside. He reports eating at his baseline with no significant changes to his PO intake. His wife mentions that he had hip surgery in August and had been on steroids. They report this as being a suspected factor into his recent weight loss.   Pt has been experiencing intermittent nausea which is treated with medications.   Meal completions: 12/12: 100% breakfast  Pt reports unintentional weight loss within the last 2 months. He states that his weight was about 230 lbs and just upon admission was 197 lbs.   Can confirm this weight loss based on documented of weight history on file. Pt's weight noted to be 104.3 kg on 08/14 and current admit weight is 89.6 kg. This is a 14.1% weight loss which is clinically significant for time frame.   Pt does not currently meet criteria for malnutrition however is at high nutrition risk. He would benefit from the addition of nutrition supplements and a regular diet to best optimize his PO intake.   Medications: protonix  Labs: reviewed  NUTRITION -  FOCUSED PHYSICAL EXAM:  Flowsheet Row Most Recent Value  Orbital Region No depletion  Upper Arm Region No depletion  Thoracic and Lumbar Region No depletion  Buccal Region No depletion  Temple Region No depletion  Clavicle Bone Region No depletion  Clavicle and Acromion Bone Region No depletion  Scapular Bone Region No depletion  Dorsal Hand No depletion  Patellar Region No depletion  Anterior Thigh Region No depletion  Posterior Calf Region No depletion  Edema (RD Assessment) None  Hair Reviewed  Eyes Reviewed  Mouth Reviewed  Skin Reviewed  Nails Reviewed       Diet Order:   Diet Order             Diet regular Room service appropriate? Yes; Fluid consistency: Thin  Diet effective now                   EDUCATION NEEDS:   Education needs have been addressed  Skin:  Skin Assessment: Reviewed RN Assessment  Last BM:  12/11  Height:   Ht Readings from Last 1 Encounters:  10/27/22 5\' 11"  (1.803 m)    Weight:   Wt Readings from Last 1 Encounters:  10/28/22 89.6 kg   BMI:  Body mass index is 27.55 kg/m.  Estimated Nutritional Needs:   Kcal:  9509-3267  Protein:  115-130g  Fluid:  >/=2L  Clayborne Dana, RDN, LDN Clinical Nutrition

## 2022-10-28 NOTE — ED Provider Notes (Incomplete)
[facility] EMERGENCY DEPARTMENT Provider Note   CSN: [id number] Arrival date & time: 10/27/22  [id number]     History {Add pertinent medical, surgical, social history, OB history to HPI:1} Chief Complaint  Patient presents with  . Abdominal Pain    Thomas [name] is a 71 y.o. male.   Abdominal Pain Patient presents with right-sided chest/abdominal pain.  Has had for months now.  States he is lost around 30 pounds and has been nauseous.  PCP is ordered CT scan but pain is uncontrolled.  No fevers.  No dysuria.  Pain not worse with eating.  Feels the pain is worse with breathing.  Does not necessarily feel short of breath however.  Did have hip replacement previously.  Is on tramadol for pain for the hip.    Past Medical History:  Diagnosis Date  . Abdominal wall pain 04/02/2021  . Acute ischemic stroke ([place]) 03/22/2020  . Avulsed toenail, initial encounter 05/04/2018  . Dyspnea on exertion 02/10/2020  . ED (erectile dysfunction)   . Epidermoid cyst of neck 10/16/2017  . Essential hypertension 07/21/2017  . GERD (gastroesophageal reflux disease)   . Gout of multiple sites 07/21/2017  . History of kidney stones   . History of squamous cell carcinoma excision   . Hyperlipidemia   . Hypertension not at goal 01/19/2014  . It band syndrome, left 03/19/2020  . 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 Left ureteral calculus   . Lower abdominal pain 08/27/2015  . Metatarsalgia of both feet 03/19/2020  . Multiple joint pain 07/16/2018  . Pain in joint, shoulder region 12/11/2015  . Right flank pain 04/02/2021  . 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 Urgency of urination   . URI (upper respiratory infection) 11/08/2014  . Wears glasses     Home Medications Prior to Admission medications   Medication Sig Start Date End Date Taking? Authorizing Provider  acetaminophen (TYLENOL) 650 MG CR tablet Take 1,300 mg by mouth in the  morning and at bedtime.   Yes [provider]  Alirocumab (PRALUENT) 150 MG/ML SOAJ Inject 1 Pen into the skin every 14 (fourteen) days. Patient taking differently: Inject 1 Pen into the skin every 14 (fourteen) days. Every other Sunday 08/22/22  Yes Park Liter, MD  allopurinol (ZYLOPRIM) 300 MG tablet Take 300 mg by mouth daily. 05/14/20  Yes [provider]  amLODipine (NORVASC) 5 MG tablet Take 1 tablet (5 mg total) by mouth daily. Patient taking differently: Take 5 mg by mouth every evening. 06/06/22  Yes Weaver, Scott T, PA-C  docusate sodium (COLACE) 100 MG capsule Take 1 capsule (100 mg total) by mouth daily as needed. Patient taking differently: Take 100 mg by mouth daily as needed for mild constipation or moderate constipation. 06/22/22 06/22/23 Yes Aundra Dubin, PA-C  gabapentin (NEURONTIN) 300 MG capsule Take 900 mg by mouth at bedtime.   Yes [provider]  HYDROcodone-acetaminophen (NORCO/VICODIN) 5-325 MG tablet Take 1 tablet by mouth 2 (two) times daily as needed (for breakthrough pain). 10/07/22  Yes [provider]  ibuprofen (ADVIL) 200 MG tablet Take 600 mg by mouth every 8 (eight) hours as needed for moderate pain.   Yes [provider]  methocarbamol (ROBAXIN) 750 MG tablet Take 1 tablet (750 mg total) by mouth every 8 (eight) hours as needed for muscle spasms. 09/29/22  Yes Hudnall, Sharyn Lull, MD  minoxidil (LONITEN) 10 MG tablet Take 1-1.5 tablets (10-15 mg  total) by mouth 2 (two) times daily. TAKE 1 TABLET IN THE MORNING AND TAKE 1/2 TABLET AT NIGHT Patient taking differently: Take 10 mg by mouth 2 (two) times daily. 09/12/22  Yes Park Liter, MD  ondansetron (ZOFRAN) 4 MG tablet Take 1 tablet (4 mg total) by mouth every 8 (eight) hours as needed for nausea or vomiting. 10/03/22  Yes Hudnall, Sharyn Lull, MD  pantoprazole (PROTONIX) 40 MG tablet TAKE 1 TABLET BY MOUTH TWICE A DAY Patient taking differently: Take 40 mg by  mouth 2 (two) times daily. 08/04/22  Yes Mansouraty, Telford Nab., MD  PRESCRIPTION MEDICATION Apply 1 Application topically at bedtime as needed (pain). Compounded pain cream ([place])   Yes [provider]  traMADol (ULTRAM) 50 MG tablet Take 1-2 tablets (50-100 mg total) by mouth daily as needed. Patient taking differently: Take 150 mg by mouth daily as needed for moderate pain or severe pain. 07/07/22  Yes Leandrew Koyanagi, MD  valsartan (DIOVAN) 320 MG tablet TAKE 1 TABLET BY MOUTH EVERY DAY Patient taking differently: Take 320 mg by mouth daily. 08/20/22  Yes Park Liter, MD  aspirin EC 81 MG tablet Take 1 tablet (81 mg total) by mouth 2 (two) times daily. To be taken after surgery to prevent blood clots Patient not taking: Reported on 10/27/2022 06/22/22 06/22/23  Aundra Dubin, PA-C      Allergies    Statins    Review of Systems   Review of Systems  Gastrointestinal:  Positive for abdominal pain.    Physical Exam Updated Vital Signs BP (!) 150/69 (BP Location: Right Arm)   Pulse 80   Temp 98.4 F (36.9 C) (Oral)   Resp 16   Ht 5\' 11"  (1.803 m)   Wt 88.5 kg   SpO2 96%   BMI 27.20 kg/m  Physical Exam Vitals and nursing note reviewed.  Cardiovascular:     Rate and Rhythm: Normal rate and regular rhythm.  Pulmonary:     Breath sounds: Normal breath sounds.     Comments: Some tenderness right posterior chest wall. Chest:     Chest wall: Tenderness present.  Abdominal:     General: Abdomen is scaphoid.     Tenderness: There is no abdominal tenderness.  Skin:    General: Skin is warm.     Capillary Refill: Capillary refill takes less than 2 seconds.  Neurological:     Mental Status: He is alert and oriented to person, place, and time.     ED Results / Procedures / Treatments   Labs (all labs ordered are listed, but only abnormal results are displayed) Labs Reviewed  CBC WITH DIFFERENTIAL/PLATELET - Abnormal; Notable for the following  components:      Result Value   Hemoglobin 12.8 (*)    Neutro Abs 8.7 (*)    All other components within normal limits  COMPREHENSIVE METABOLIC PANEL - Abnormal; Notable for the following components:   Glucose, Bld 135 (*)    All other components within normal limits  LIPASE, BLOOD  URINALYSIS, ROUTINE W REFLEX MICROSCOPIC  COMPREHENSIVE METABOLIC PANEL  CBC  HIV ANTIBODY (ROUTINE TESTING W REFLEX)    EKG None  Radiology CT Angio Abd/Pel W and/or Wo Contrast  Result Date: 10/27/2022 CLINICAL DATA:  Mesenteric ischemia, chronic EXAM: CTA ABDOMEN AND PELVIS WITH CONTRAST TECHNIQUE: Multidetector CT imaging of the abdomen and pelvis was performed using the standard protocol during bolus administration of intravenous contrast. Multiplanar reconstructed images and  MIPs were obtained and reviewed to evaluate the vascular anatomy. RADIATION DOSE REDUCTION: This exam was performed according to the departmental dose-optimization program which includes automated exposure control, adjustment of the mA and/or kV according to patient size and/or use of iterative reconstruction technique. CONTRAST:  31mL OMNIPAQUE IOHEXOL 350 MG/ML SOLN COMPARISON:  CT abdomen pelvis 10/27/2022 12:05 p.m., CT abdomen pelvis 01/14/2021 FINDINGS: VASCULAR Aorta: Slightly limited evaluation due to timing of contrast. Severe atherosclerotic plaque. Narrowing of the infrarenal abdominal aorta caliber down to 1.1 cm at the level of the kidneys. Otherwise normal aorta without aneurysm, dissection, vasculitis. Celiac: Patent without evidence of aneurysm, dissection, vasculitis or significant stenosis. SMA: Patent without evidence of aneurysm, dissection, vasculitis or significant stenosis. Renals: Both renal arteries are patent without evidence of aneurysm, dissection, vasculitis, fibromuscular dysplasia or significant stenosis. IMA: Patent without evidence of aneurysm, dissection, vasculitis or significant stenosis. Inflow: Patent  without evidence of aneurysm, dissection, vasculitis or significant stenosis. Proximal Outflow: Bilateral common femoral and visualized portions of the superficial and profunda femoral arteries are patent without evidence of aneurysm, dissection, vasculitis or significant stenosis. Veins: No portal venous or mesenteric venous gas. The main portal, splenic, superior mesenteric veins are patent. Review of the MIP images confirms the above findings. NON-VASCULAR Lower chest: Please see separately dictated CT angiography chest 10/27/2022. Hepatobiliary: There is a 1.2 cm right hepatic lobe fluid density lesion that likely represents a hepatic cyst. Subcentimeter hypodensity too small to characterize no gallstones, gallbladder wall thickening, or pericholecystic fluid. No biliary dilatation. Pancreas: No focal lesion. Normal pancreatic contour. No surrounding inflammatory changes. No main pancreatic ductal dilatation. Spleen: Normal in size without focal abnormality. Adrenals/Urinary Tract: No adrenal nodule bilaterally.  Fluid Bilateral kidneys enhance symmetrically. Density lesions within the kidneys likely represent simple renal cysts. Simple renal cysts, in the absence of clinically indicated signs/symptoms, require no independent follow-up. Subcentimeter hypodensities are too small to characterize-no further follow-up indicated. No hydronephrosis. No hydroureter. The urinary bladder is unremarkable. There is no urothelial wall thickening and there are no filling defects in the opacified portions of the bilateral collecting systems or ureters. Stomach/Bowel: Stomach is within normal limits. No evidence of bowel wall thickening or dilatation. Colonic diverticulosis. Appendix appears normal. Lymphatic: No lymphadenopathy. Reproductive: The prostate is enlarged measuring up to 5 cm. Other: Surgical clips noted within the right lower quadrant. No intraperitoneal free fluid. No intraperitoneal free gas. No organized fluid  collection. Musculoskeletal: Tiny fat containing umbilical hernia. Destructive lytic lesion of the right iliac with associated 5.7 x 4.6 cm soft tissue density. No acute displaced fracture. Multilevel degenerative changes of the spine. Total right hip arthroplasty. IMPRESSION: VASCULAR 1. Aortic Atherosclerosis (ICD10-I70.0) -severe leading to infrarenal abdominal aortic stenosis with caliber down to 1 cm. NON-VASCULAR 1. Colonic diverticulosis with no acute diverticulitis. 2. Prostatomegaly. 3. Destructive lytic lesion of the right iliac with associated 5.7 x 4.6 cm soft tissue density. Finding concerning for malignancy/metastatic disease. 4. Please see separately CT angiography chest 10/27/2022 Electronically Signed   By: Iven Finn M.D.   On: 10/27/2022 20:10   CT Angio Chest PE W and/or Wo Contrast  Result Date: 10/27/2022 CLINICAL DATA:  Worsening right-sided pain, possible pulmonary embolus EXAM: CT ANGIOGRAPHY CHEST WITH CONTRAST TECHNIQUE: Multidetector CT imaging of the chest was performed using the standard protocol during bolus administration of intravenous contrast. Multiplanar CT image reconstructions and MIPs were obtained to evaluate the vascular anatomy. RADIATION DOSE REDUCTION: This exam was performed according to the departmental dose-optimization program  which includes automated exposure control, adjustment of the mA and/or kV according to patient size and/or use of iterative reconstruction technique. CONTRAST:  30mL OMNIPAQUE IOHEXOL 350 MG/ML SOLN COMPARISON:  Chest radiograph 03/22/2020 FINDINGS: Cardiovascular: Reduced caliber of the right upper lobe pulmonary artery due to mass effect from surrounding tumor. No definite filling defect is identified in the pulmonary arterial tree to suggest pulmonary embolus. Coronary, aortic arch, and branch vessel atherosclerotic vascular disease. Mild cardiomegaly. Mediastinum/Nodes: Right paratracheal node/mass 3.1 cm in short axis on image 49  series 5. Prevascular node 1.6 cm in short axis, image 48 series 5. Right hilar lymph node 2.5 cm in short axis, image 62 series 5. Additional indistinct prevascular lymph nodes noted on the right. Lungs/Pleura: There is a rind of density along the posterior pleural margin along the right lower lobe, suspicious for pleural tumor, about 1.9 cm in thickness on image 100 series 5. Adjacent erosion of the right ninth rib on image 42 series 10, with suspected tumor extension into the chest wall in the eighth intercostal space. Right upper lobe pulmonary nodule 1.8 by 1.2 cm on image 51 series 6. Right lower lobe pleural-based nodule along the major fissure, 0.9 by 0.5 cm on image 84 series 6. Nodularity along the pleural margin of the right posterior diaphragm measuring 2.7 by 1.1 cm on image 73 series 10. Airway thickening is present, suggesting bronchitis or reactive airways disease. Calcified granuloma in the superior segment right lower lobe (benign). Mild perihilar ground-glass opacity in the right upper lobe is indistinct and likely inflammatory on image 50 series 6. Upper Abdomen: Hypodense 1.5 cm lesion in the right hepatic lobe on image 111 of series 5, fluid density and most likely to be a cyst. Thoracic spondylosis. Musculoskeletal: As noted above, the pleural mass on the right is associated with a erosion of the top of the right ninth rib and extension into the right eighth intercostal space compatible chest wall invasion. Review of the MIP images confirms the above findings. IMPRESSION: 1. No filling defect in the pulmonary arterial tree to suggest pulmonary embolus. Reduced caliber of the right upper lobe pulmonary artery due to mass effect from the surrounding tumor. 2. Pleural mass along the right lower lobe posteriorly with erosion of the right ninth rib and extension into the right eighth intercostal space compatible with chest wall invasion. 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 3.  Right upper lobe pulmonary nodule 1.8 by 1.2 cm, suspicious for malignancy. 4. Right hilar, right paratracheal, and prevascular adenopathy compatible with malignancy. 5. Airway thickening is present, suggesting bronchitis or reactive airways disease. 6. Mild cardiomegaly. Coronary, aortic arch, and branch vessel atherosclerotic vascular disease. 7. Hypodense 1.5 cm lesion in the right hepatic lobe is fluid density and most likely to be a cyst. 8. Aortic atherosclerosis. 9. Mild perihilar ground-glass opacity in the right upper lobe is indistinct and likely inflammatory. Aortic Atherosclerosis (ICD10-I70.0). Electronically Signed   By: Van Clines M.D.   On: 10/27/2022 19:57   CT Abdomen Pelvis W Contrast  Result Date: 10/27/2022 CLINICAL DATA:  Abdominal pain EXAM: CT ABDOMEN AND PELVIS WITH CONTRAST TECHNIQUE: Multidetector CT imaging of the abdomen and pelvis was performed using the standard protocol following bolus administration of intravenous contrast. RADIATION DOSE REDUCTION: This exam was performed according to the departmental dose-optimization program which includes automated exposure control, adjustment of the mA and/or kV according to patient size and/or use of iterative reconstruction technique. CONTRAST:  15mL OMNIPAQUE IOHEXOL  350 MG/ML SOLN COMPARISON:  Right upper quadrant ultrasound obtained the same day, hip radiographs 06/30/2022, pelvis radiographs 08/14/2022, lumbar spine MRI 04/14/2021 FINDINGS: Lower chest: There is soft tissue attenuating pleural thickening along the posterior right lower lobe (3-3) measuring up to 1.0 cm in thickness and 5.1 cm transverse. There is additional nodular pleural thickening along the medial aspect of the right hemidiaphragm measuring up to 2.7 cm x 1.0 cm (6-62). The imaged heart is unremarkable. Hepatobiliary: A 1.5 cm hypodense lesion in the right hepatic lobe most likely reflects a benign cyst requiring no specific imaging follow-up. There are no  other focal liver lesions. The gallbladder is unremarkable. There is no biliary ductal dilatation. Pancreas: Unremarkable. Spleen: Unremarkable. Adrenals/Urinary Tract: The adrenals are unremarkable. There are multiple hypodense lesions in both kidneys likely reflecting benign cysts requiring no specific imaging follow-up. There are no suspicious renal lesions. There are no stones in either kidney or along the course of either ureter. There is symmetric excretion of contrast into the collecting systems on the delayed images. The bladder is unremarkable. Stomach/Bowel: The stomach is unremarkable. There is no evidence of bowel obstruction. There is extensive colonic diverticulosis. There is no convincing evidence of acute diverticulitis or primary colonic neoplasm. Appendix is normal. Vascular/Lymphatic: There is extensive mixed plaque in the nonaneurysmal abdominal aorta. 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 The major aortic branch vessels are patent. The main portal and splenic veins are patent. Reproductive: The prostate and seminal vesicles are grossly unremarkable though largely obscured by streak artifact from the right hip arthroplasty hardware. Other: There is no ascites or free air. There is a small fat containing umbilical hernia. Postsurgical changes are noted along the right ventral abdominal wall. Musculoskeletal: There is a destructive lytic lesion in the right iliac bone with soft tissue extension measuring up to 5.8 cm TV x 4.3 cm AP x 4.7 cm cc. This lesion was not present on the lumbar spine MRI from 04/14/2021. There is a prominent Schmorl's node indenting the inferior L2 endplate. There is no other aggressive or destructive osseous lesion. Right hip arthroplasty hardware is noted. IMPRESSION: 1. Destructive lytic lesion in the right iliac bone with soft tissue extension measuring up to 5.8  cm, new since the lumbar spine MRI from 04/14/2021, highly suspicious for metastatic disease. 2. Soft tissue attenuating pleural thickening along the posterior right lower lobe and along the medial aspect of the right hemidiaphragm are suspicious for pleural metastases. Recommend oncologic workup and dedicated CT chest with contrast. 3. Extensive colonic diverticulosis without evidence of acute diverticulitis. No convincing evidence of primary colonic neoplasm. 4. 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 Recommend vascular referral and nonemergent CTA abdomen/pelvis. Electronically Signed   By: Valetta Mole M.D.   On: 10/27/2022 12:40   US Abdomen Limited RUQ (LIVER/GB)  Result Date: 10/27/2022 CLINICAL DATA:  RIGHT upper quadrant pain EXAM: ULTRASOUND ABDOMEN LIMITED RIGHT UPPER QUADRANT COMPARISON:  February 2022 FINDINGS: Gallbladder: No gallstones or wall thickening visualized. No sonographic Murphy sign noted by sonographer. Common bile duct: Diameter: 5.5 mm, without intrahepatic biliary duct distension. Liver: Increased hepatic echogenicity without visible lesion on submitted images. Study limited by body habitus. Portal vein is patent on color Doppler imaging with normal direction of blood flow towards the liver. Other:  no ascites IMPRESSION: Hepatic  steatosis without acute findings. Electronically Signed   By: Zetta Bills M.D.   On: 10/27/2022 10:34    Procedures Procedures  {Document cardiac monitor, telemetry assessment procedure when appropriate:1}  Medications Ordered in ED Medications  HYDROmorphone (DILAUDID) injection 1 mg (1 mg Intravenous Given 10/27/22 2229)  allopurinol (ZYLOPRIM) tablet 300 mg (has no administration in time range)  acetaminophen (TYLENOL) tablet 1,000 mg (1,000 mg Oral Given 10/27/22 2302)  Alirocumab SOAJ 1 Pen (has no  administration in time range)  irbesartan (AVAPRO) tablet 300 mg (has no administration in time range)  docusate sodium (COLACE) capsule 100 mg (has no administration in time range)  pantoprazole (PROTONIX) EC tablet 40 mg (40 mg Oral Given 10/27/22 2302)  gabapentin (NEURONTIN) capsule 900 mg (900 mg Oral Given 10/27/22 2302)  heparin injection 5,000 Units (5,000 Units Subcutaneous Given 10/27/22 2302)  0.9 %  sodium chloride infusion ( Intravenous New Bag/Given 10/27/22 2302)  ondansetron (ZOFRAN) tablet 4 mg (has no administration in time range)    Or  ondansetron (ZOFRAN) injection 4 mg (has no administration in time range)  albuterol (PROVENTIL) (2.5 MG/3ML) 0.083% nebulizer solution 2.5 mg (has no administration in time range)  oxyCODONE-acetaminophen (PERCOCET/ROXICET) 5-325 MG per tablet 1 tablet (1 tablet Oral Given 10/27/22 1008)  iohexol (OMNIPAQUE) 350 MG/ML injection 75 mL (75 mLs Intravenous Contrast Given 10/27/22 1208)  HYDROmorphone (DILAUDID) injection 0.5 mg (0.5 mg Intravenous Given 10/27/22 1703)  HYDROmorphone (DILAUDID) injection 1 mg (1 mg Intravenous Given 10/27/22 1821)  iohexol (OMNIPAQUE) 350 MG/ML injection 85 mL (85 mLs Intravenous Contrast Given 10/27/22 1912)  HYDROmorphone (DILAUDID) injection 2 mg (2 mg Intravenous Given 10/27/22 2044)    ED Course/ Medical Decision Making/ A&P                           Medical Decision Making Amount and/or Complexity of Data Reviewed Radiology: ordered.  Risk Prescription drug management. Decision regarding hospitalization.   Patient presents with pain on the right flank/lower chest for around 9 weeks now.  Did have previous hip replacement.  Has been seeing various doctors including sports medicine and pain medicine.  Has lost around 30 pounds.  Outpatient CT had been ordered.  Blood work reassuring.  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  Will get CT  scan of the chest to evaluate for PE with likely malignancy.  Also will get angiogram of the abdomen pelvis since patient has had reported SMA narrowing in the past.  Although since this is not associate with eating I feel this is somewhat less likely.  Scans done and show likely malignancy.  Does have nodes and right pleural mass.  Also lung nodule.  Does erosion of the bone I think this is the cause of the pain.  Requiring IV narcotics for pain control.  I think he needs admission for pain control and hopefully expedited workup of the likely malignancy. Also has aortic narrowing in his abdomen.  I think this is unlikely because of the pain.    {Document critical care time when appropriate:1} {Document review of labs and clinical decision tools ie heart score, Chads2Vasc2 etc:1}  {Document your independent review of radiology images, and any outside records:1} {Document your discussion with family members, caretakers, and with consultants:1} {Document social determinants of health affecting pt's care:1} {Document your decision making why or why not admission, treatments were needed:1} Final Clinical Impression(s) /  ED Diagnoses Final diagnoses:  Intractable pain  Metastatic malignant neoplasm, unspecified site [facility])    Rx / DC Orders ED Discharge Orders     None

## 2022-10-28 NOTE — Consult Note (Signed)
Thomas Thomas [phone]   [phone]  CONSULT NOTE  REFERRING PHYSICIAN: Darliss Cheney, MD   REASON FOR CONSULTATION:  71 years old white male with highly suspicious metastatic neoplasm.  HPI Thomas Thomas is a 71 y.o. male with medical history significant for hypertension, acute ischemic stroke, GERD, gout, history of kidney stones as well as osteoarthritis as well as superior mesenteric artery stenosis.  The patient has remote history of smoking for around 10 years but quit 1995.  He was admitted to the hospital with persistent right upper quadrant abdominal pain that has been going on for the last 2 months.  He also lost around 30 pounds in the last few months.  He was evaluated by his primary care physician and was undergoing imaging studies which was still pending at the time of his presentation to the emergency department.  The patient had CT scan of the abdomen and pelvis performed on October 27, 2022 and that showed destructive lytic lesion in the right iliac bone with soft tissue extension measuring up to 5.8 cm new since the previous lumbar spine MRI on Apr 14 2021 highly suspicious for metastatic disease.  There was also soft tissue attenuation of pleural thickening along the posterior right lower lobe and along the medial aspect of the right hemidiaphragm suspicious for pleural metastasis.  There was no evidence of primary colonic neoplasm.  The patient had CT angiogram of the chest and that showed no filling defect in the pulmonary artery tree to suggest pulmonary embolus but there was reduced caliber of the right upper lobe pulmonary artery due to mass effect from the surrounding tumor.  There was also a pleural mass along the right lower lobe posteriorly with erosion of the right ninth rib and extension into the right eighth intercostal space compatible with chest wall invasion.  There was also a pleural tumor along the right hemidiaphragm.  The  scan also showed right upper lobe pulmonary nodule measuring 1.8 x 1.2 cm suspicious for malignancy in addition to right hilar, right paratracheal and prevascular adenopathy compatible with malignancy.  There was also hypodense 1.5 cm lesion in the right hepatic lobe likely a cyst. I was consulted to see the patient today for evaluation and recommendation regarding his condition.  He continues to have significant amount of pain in his current Dilaudid every 4 hours is not very effective and he needed more frequently.  He denied having any shortness of breath, cough or hemoptysis.  He has no nausea, vomiting, diarrhea or constipation but he continues to have the right upper quadrant pain.  He has no headache or visual changes. History significant for sister who had Hodgkin's lymphoma in early age and then breast cancer secondary to radiotherapy. Mother had heart disease and father had hypertension. The patient is married and has 2 children and son and daughter.  His wife Thomas Thomas was available by phone during the visit.  He works as a Pharmacist, hospital.  He has a history for smoking for around 10 years in his early age and quit in 1995.  He drinks alcohol occasionally and no history of drug abuse.  HPI  Past Medical History:  Diagnosis Date   Abdominal wall pain 04/02/2021   Acute ischemic stroke ([facility]) 03/22/2020   Avulsed toenail, initial encounter 05/04/2018   Dyspnea on exertion 02/10/2020   ED (erectile dysfunction)    Epidermoid cyst of neck 10/16/2017   Essential hypertension 07/21/2017   GERD (gastroesophageal reflux disease)  Gout of multiple sites 07/21/2017   History of kidney stones    History of squamous cell carcinoma excision    Hyperlipidemia    Hypertension not at goal 01/19/2014   It band syndrome, left 03/19/2020   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/24/2020   Left ureteral calculus    Lower abdominal pain 08/27/2015   Metatarsalgia of Thomas Thomas 03/19/2020   Multiple  joint pain 07/16/2018   Pain in joint, shoulder region 12/11/2015   Right flank pain 04/02/2021   Superior mesenteric artery stenosis ([place]) 02/14/2020   Urgency of urination    URI (upper respiratory infection) 11/08/2014   Wears glasses     Past Surgical History:  Procedure Laterality Date   COLONOSCOPY WITH PROPOFOL  2017   CYSTOSCOPY/RETROGRADE/URETEROSCOPY/STONE EXTRACTION WITH BASKET Left 04/16/2018   Procedure: CYSTOSCOPY/RETROGRADE/URETEROSCOPY/STONE EXTRACTION WITH BASKET/ HOLMIUM LASER LITHOTRIPSY/ STENT PLACEMENT;  Surgeon: Ardis Hughs, MD;  Location: [facility];  Service: Urology;  Laterality: Left;   HERNIA REPAIR Right 2022   inguinal   HOLMIUM LASER APPLICATION Left 16/08/9603   Procedure: HOLMIUM LASER APPLICATION;  Surgeon: Ardis Hughs, MD;  Location: [facility];  Service: Urology;  Laterality: Left;   TOTAL HIP ARTHROPLASTY Right 06/30/2022   Procedure: RIGHT TOTAL HIP ARTHROPLASTY ANTERIOR APPROACH;  Surgeon: Leandrew Koyanagi, MD;  Location: [facility];  Service: Orthopedics;  Laterality: Right;  3-C    Family History  Problem Relation Age of Onset   Heart disease Mother 3   Alcohol abuse Mother    Depression Mother    Hypertension Father    Sleep apnea Father    Cancer Sister        hodgkins, breast   Colon cancer Neg Hx    Pancreatic cancer Neg Hx    Rectal cancer Neg Hx    Stomach cancer Neg Hx    Esophageal cancer Neg Hx    Inflammatory bowel disease Neg Hx    Liver disease Neg Hx     Social History Social History   Tobacco Use   Smoking status: Former    Packs/day: 0.30    Years: 20.00    Total pack years: 6.00    Types: Cigarettes    Quit date: 10/17/1994    Years since quitting: 28.0   Smokeless tobacco: Never  Substance Use Topics   Alcohol use: Not Currently   Drug use: No    Allergies  Allergen Reactions   Statins     Jaw tightness and severe muscle pain- Pravastatin     Current  Facility-Administered Medications  Medication Dose Route Frequency Provider Last Rate Last Admin   0.9 %  sodium chloride infusion   Intravenous Continuous Clance Boll, MD 75 mL/hr at 10/28/22 0528 Infusion Verify at 10/28/22 0528   acetaminophen (TYLENOL) tablet 1,000 mg  1,000 mg Oral Q8H Myles Rosenthal A, MD   1,000 mg at 10/28/22 1323   albuterol (PROVENTIL) (2.5 MG/3ML) 0.083% nebulizer solution 2.5 mg  2.5 mg Nebulization Q2H PRN Clance Boll, MD       Alirocumab SOAJ 1 Pen  1 Pen Subcutaneous Q14 Days Clance Boll, MD       allopurinol (ZYLOPRIM) tablet 300 mg  300 mg Oral Daily Myles Rosenthal A, MD   300 mg at 10/28/22 0933   amLODipine (NORVASC) tablet 5 mg  5 mg Oral Daily Thomas Cheney, MD   5 mg at 10/28/22 1236   docusate sodium (COLACE) capsule  100 mg  100 mg Oral Daily PRN Clance Boll, MD       feeding supplement (ENSURE ENLIVE / ENSURE PLUS) liquid 237 mL  237 mL Oral BID BM Thomas Cheney, MD       gabapentin (NEURONTIN) capsule 900 mg  900 mg Oral QHS Myles Rosenthal A, MD   900 mg at 10/27/22 2302   heparin injection 5,000 Units  5,000 Units Subcutaneous Q8H Clance Boll, MD   5,000 Units at 10/28/22 1324   HYDROcodone-acetaminophen (NORCO/VICODIN) 5-325 MG per tablet 1 tablet  1 tablet Oral Q6H PRN Thomas Cheney, MD   1 tablet at 10/28/22 1644   HYDROmorphone (DILAUDID) injection 1 mg  1 mg Intravenous Q4H PRN Clance Boll, MD   1 mg at 10/28/22 1731   irbesartan (AVAPRO) tablet 300 mg  300 mg Oral Daily Myles Rosenthal A, MD   300 mg at 10/28/22 4665   methocarbamol (ROBAXIN) tablet 750 mg  750 mg Oral Q8H PRN Thomas Cheney, MD   750 mg at 10/28/22 1236   ondansetron (ZOFRAN) tablet 4 mg  4 mg Oral Q6H PRN Clance Boll, MD       Or   ondansetron [facility]) injection 4 mg  4 mg Intravenous Q6H PRN Clance Boll, MD   4 mg at 10/28/22 0933   pantoprazole (PROTONIX) EC tablet 40 mg  40 mg Oral BID Clance Boll, MD   40 mg at 10/28/22 9935    Review of Systems  Constitutional: positive for fatigue and weight loss Eyes: negative Ears, nose, mouth, throat, and face: negative Respiratory: negative Cardiovascular: negative Gastrointestinal: positive for abdominal pain Genitourinary:negative Integument/breast: negative Hematologic/lymphatic: negative Musculoskeletal:positive for arthralgias and bone pain Neurological: negative Behavioral/Psych: negative Endocrine: negative Allergic/Immunologic: negative  Physical Exam  TSV:XBLTJ, healthy, no distress, well nourished, and well developed SKIN: skin color, texture, turgor are normal, no rashes or significant lesions HEAD: Normocephalic, No masses, lesions, tenderness or abnormalities EYES: normal, PERRLA, Conjunctiva are pink and non-injected EARS: External ears normal, Canals clear OROPHARYNX:no exudate, no erythema, and lips, buccal mucosa, and tongue normal  NECK: supple, no adenopathy, no JVD LYMPH:  no palpable lymphadenopathy, no hepatosplenomegaly LUNGS: clear to auscultation , and palpation HEART: regular rate & rhythm, no murmurs, and no gallops ABDOMEN:abdomen soft, non-tender, normal bowel sounds, and no masses or organomegaly BACK: Back symmetric, no curvature., No CVA tenderness EXTREMITIES:no joint deformities, effusion, or inflammation, no edema  NEURO: alert & oriented x 3 with fluent speech, no focal motor/sensory deficits  PERFORMANCE STATUS: ECOG 1  LABORATORY DATA: Lab Results  Component Value Date   WBC 9.6 10/28/2022   HGB 11.8 (L) 10/28/2022   HCT 36.6 (L) 10/28/2022   MCV 81.5 10/28/2022   PLT 285 10/28/2022    @LASTCHEM @  RADIOGRAPHIC STUDIES: CT Angio Chest/Abd/Pel for Dissection W and/or W/WO  Result Date: 10/28/2022 CLINICAL DATA:  Aortic aneurysm suspected or dissection. EXAM: CT ANGIOGRAPHY CHEST, ABDOMEN AND PELVIS TECHNIQUE: Non-contrast CT of the chest was initially obtained. Multidetector CT  imaging through the chest, abdomen and pelvis was performed using the standard protocol during bolus administration of intravenous contrast. Multiplanar reconstructed images and MIPs were obtained and reviewed to evaluate the vascular anatomy. RADIATION DOSE REDUCTION: This exam was performed according to the departmental dose-optimization program which includes automated exposure control, adjustment of the mA and/or kV according to patient size and/or use of iterative reconstruction technique. CONTRAST:  12mL OMNIPAQUE IOHEXOL 350 MG/ML SOLN COMPARISON:  CTA chest and subsequent abdomen and pelvis CT yesterday at 6:50 p.m., CT abdomen and pelvis with IV contrast 01/14/2021. FINDINGS: CTA CHEST FINDINGS Cardiovascular: The heart is slightly enlarged. There is a small anterior pericardial effusion. There are calcifications in the distal left main, proximal LAD and right coronary arteries. The pulmonary arteries and veins are normal in caliber except for again noted encasement and narrowing of the right upper lobe pulmonary artery by adenopathy. The pulmonary arteries are centrally clear. There is scattered aortic mixed plaque, scattered calcific plaques in the great vessels without stenosis, aneurysm or dissection. Mediastinum/Nodes: Right paratracheal nodal mass measures 4.5 x 3.3 cm on 7:49. A portion of this encases the right upper lobe pulmonary artery with narrowing. A prevascular lymph node right in front of the SVC measures 1.5 x 2.2 cm on 7:51. There is a right hilar lymph node just above the distal right main pulmonary artery measuring 2.4 cm in short axis on 7:61. There is a posterior right hilar node measuring 1.5 cm in short axis on 7:64 and a right mid hilar node of 1 cm in short axis on 7:77. Additional prevascular space node to the right is 2.3 x 1.8 cm on 7:37. No thyroid or axillary mass is seen. The trachea is clear. The thoracic esophagus unremarkable. Lungs/Pleura: Small layering right pleural  effusion possibly malignant. Nodular pleural thickening measuring 3.3 x 1.4 cm is noted posterior to the right hemidiaphragm on 7:117 most likely indicating pleural metastasis. Tumor extension into the chest wall is suspected through the right eighth intercostal space posteriorly and there is a rind of pleural thickening in this area as well (see series 7 axial images 93-101). There is a right upper lobe pleural-based pulmonary nodule anteromedially on 6:22 measuring 1.7 x 1.3 cm with small lobulations most likely a neoplasm and possibly the primary neoplasm. In the anterior basal segment of the right lower lobe there is a pleural-based nodule measuring 8 x 6 mm on 6:43. An additional pleural nodular focus suspected on 7:51 in the posterior right upper lung field. There is patchy ground-glass opacity posteriorly in the right upper lobe which could be due to pneumonitis or lymphangitic carcinomatosis. No other focal infiltrate or further nodules are seen. No left pleural effusion. Musculoskeletal: There is erosion in the anterior aspect of the posterior right ninth rib where the pleural thickening extends through the eighth intercostal space consistent with early metastatic rib destruction. No other focal bone lesion is seen. There is no pathologic fracture. Review of the MIP images confirms the above findings. CTA ABDOMEN AND PELVIS FINDINGS VASCULAR Aorta: There is heavy mixed plaque. The aortic lumen is narrowed to 1 cm focally in the proximal infrarenal segment, otherwise does not show flow limiting stenosis. In the mid infrarenal aorta there is a small saccular aneurysm along the left wall measuring 9 mm at the base and 5 mm in height. There is no dissection. Celiac: There is 75% focal stenosis 1 cm distal to the vessel origin due to compression by the median arcuate ligament of the diaphragm. The vessel otherwise opacifies well. SMA: No flow-limiting stenosis, aneurysm or dissection. Renals: Single left renal  artery is widely patent. There are 2 right renal arteries, the larger of which supplies the upper to midpole, the smaller of which arises anterior to the larger artery and supplies the inferior pole. Thomas are widely patent. IMA: No flow-limiting stenosis. Inflow: There is mixed plaque in the common iliac arteries with up to 50% stenosis on  the left, no stenosis on the right. The internal iliac arteries show patchy calcific plaques without stenosis. Thomas external iliac arteries are widely patent. Proximal outflow arteries: Small amount of nonstenosing calcification in the right common femoral artery. No other significant findings of the proximal outflow vessels. Veins: Patent portal vein.  Other veins are unopacified. Review of the MIP images confirms the above findings. NON-VASCULAR Hepatobiliary: The liver is 21 cm length with mild steatosis. There is a 1.2 cm cyst in the right lobe laterally. Subcentimeter hypodensity more inferiorly in the right lobe is too small to characterize but was present on Thomas prior studies. No mass enhancement is seen. Gallbladder is distended, otherwise unremarkable no biliary dilatation. Pancreas: No abnormality. Spleen: No abnormality. Adrenals/Urinary Tract: There is no adrenal mass. There are small bilateral renal cysts and a few nonobstructive caliceal stones in the kidneys. Additional subcentimeter renal hypodensities are too small characterize but no follow-up is recommended. There is no hydronephrosis or obstructing stone. No bladder thickening is seen. Stomach/Bowel: Unremarkable stomach and small bowel, normal appendix. Moderate stool retention ascending colon. Left colonic diverticulosis without evidence of diverticulitis. Lymphatic: No appreciable adenopathy. Reproductive: Mildly enlarged but poorly visualized prostate due to artifact from right hip replacement. Other: Right lower quadrant surgical clips. No free air, hemorrhage or free fluid. Small umbilical fat hernia.  Musculoskeletal: Again noted is a destructive lesion of the medial right iliac crest measuring 5.6 x 5.1 cm on 7:235. There is ankylosis across the SI joints. Degenerative changes lumbar spine. Posterior endplate Schmorl's nodes L2-3. No other visible structure bone lesion. Review of the MIP images confirms the above findings. IMPRESSION: 1. Aortic and coronary artery atherosclerosis 2. Small anterior pericardial effusion.  Mild cardiomegaly. 3. No findings of acute aortic syndrome. 4. 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 5. 1.7 x 1.3 cm medial right upper lobe pleural-based nodule most likely a neoplasm and possibly the primary neoplasm. 6. Posterior segment right upper lobe ground-glass opacities consistent with pneumonitis or lymphangitic carcinomatosis. 7. Right hilar and mediastinal adenopathy with a 4.5 x 3.3 cm right paratracheal nodal mass encasing and narrowing the right upper lobe pulmonary artery. 8. 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 9. Metastatic erosion of the anterior aspect of the posterior right ninth rib, and a 5.6 x 5.1 cm destructive lesion of the medial right iliac crest. 10. Nonobstructive nephrolithiasis. 11. Constipation and diverticulosis. 12. 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 13. Additional findings described above. Aortic Atherosclerosis (ICD10-I70.0). Electronically Signed   By: Telford Nab M.D.   On: 10/28/2022 06:37   CT Angio Abd/Pel W and/or Wo Contrast  Result Date: 10/27/2022 CLINICAL DATA:  Mesenteric ischemia, chronic EXAM: CTA ABDOMEN AND PELVIS WITH CONTRAST TECHNIQUE: Multidetector CT imaging of the abdomen and pelvis was performed  using the standard protocol during bolus administration of intravenous contrast. Multiplanar reconstructed images and MIPs were obtained and reviewed to evaluate the vascular anatomy. RADIATION DOSE REDUCTION: This exam was performed according to the departmental dose-optimization program which includes automated exposure control, adjustment of the mA and/or kV according to patient size and/or use of iterative reconstruction technique. CONTRAST:  49mL OMNIPAQUE IOHEXOL 350 MG/ML SOLN COMPARISON:  CT abdomen pelvis 10/27/2022  12:05 p.m., CT abdomen pelvis 01/14/2021 FINDINGS: VASCULAR Aorta: Slightly limited evaluation due to timing of contrast. Severe atherosclerotic plaque. Narrowing of the infrarenal abdominal aorta caliber down to 1.1 cm at the level of the kidneys. Otherwise normal aorta without aneurysm, dissection, vasculitis. Celiac: Patent without evidence of aneurysm, dissection, vasculitis or significant stenosis. SMA: Patent without evidence of aneurysm, dissection, vasculitis or significant stenosis. Renals: Thomas renal arteries are patent without evidence of aneurysm, dissection, vasculitis, fibromuscular dysplasia or significant stenosis. IMA: Patent without evidence of aneurysm, dissection, vasculitis or significant stenosis. Inflow: Patent without evidence of aneurysm, dissection, vasculitis or significant stenosis. Proximal Outflow: Bilateral common femoral and visualized portions of the superficial and profunda femoral arteries are patent without evidence of aneurysm, dissection, vasculitis or significant stenosis. Veins: No portal venous or mesenteric venous gas. The main portal, splenic, superior mesenteric veins are patent. Review of the MIP images confirms the above findings. NON-VASCULAR Lower chest: Please see separately dictated CT angiography chest 10/27/2022. Hepatobiliary: There is a 1.2 cm right hepatic lobe fluid density lesion that likely represents a hepatic cyst. Subcentimeter  hypodensity too small to characterize no gallstones, gallbladder wall thickening, or pericholecystic fluid. No biliary dilatation. Pancreas: No focal lesion. Normal pancreatic contour. No surrounding inflammatory changes. No main pancreatic ductal dilatation. Spleen: Normal in size without focal abnormality. Adrenals/Urinary Tract: No adrenal nodule bilaterally.  Fluid Bilateral kidneys enhance symmetrically. Density lesions within the kidneys likely represent simple renal cysts. Simple renal cysts, in the absence of clinically indicated signs/symptoms, require no independent follow-up. Subcentimeter hypodensities are too small to characterize-no further follow-up indicated. No hydronephrosis. No hydroureter. The urinary bladder is unremarkable. There is no urothelial wall thickening and there are no filling defects in the opacified portions of the bilateral collecting systems or ureters. Stomach/Bowel: Stomach is within normal limits. No evidence of bowel wall thickening or dilatation. Colonic diverticulosis. Appendix appears normal. Lymphatic: No lymphadenopathy. Reproductive: The prostate is enlarged measuring up to 5 cm. Other: Surgical clips noted within the right lower quadrant. No intraperitoneal free fluid. No intraperitoneal free gas. No organized fluid collection. Musculoskeletal: Tiny fat containing umbilical hernia. Destructive lytic lesion of the right iliac with associated 5.7 x 4.6 cm soft tissue density. No acute displaced fracture. Multilevel degenerative changes of the spine. Total right hip arthroplasty. IMPRESSION: VASCULAR 1. Aortic Atherosclerosis (ICD10-I70.0) -severe leading to infrarenal abdominal aortic stenosis with caliber down to 1 cm. NON-VASCULAR 1. Colonic diverticulosis with no acute diverticulitis. 2. Prostatomegaly. 3. Destructive lytic lesion of the right iliac with associated 5.7 x 4.6 cm soft tissue density. Finding concerning for malignancy/metastatic disease. 4. Please see  separately CT angiography chest 10/27/2022 Electronically Signed   By: Iven Finn M.D.   On: 10/27/2022 20:10   CT Angio Chest PE W and/or Wo Contrast  Result Date: 10/27/2022 CLINICAL DATA:  Worsening right-sided pain, possible pulmonary embolus EXAM: CT ANGIOGRAPHY CHEST WITH CONTRAST TECHNIQUE: Multidetector CT imaging of the chest was performed using the standard protocol during bolus administration of intravenous contrast. Multiplanar CT image reconstructions and MIPs were obtained to evaluate the vascular anatomy. RADIATION DOSE REDUCTION: This exam was performed according to the departmental dose-optimization program which includes automated exposure control, adjustment of the mA and/or kV according to patient size and/or use of iterative reconstruction technique. CONTRAST:  19mL OMNIPAQUE IOHEXOL 350 MG/ML SOLN COMPARISON:  Chest radiograph 03/22/2020 FINDINGS: Cardiovascular: Reduced caliber of the right upper lobe pulmonary artery due to mass effect from surrounding tumor. No definite filling defect is identified  in the pulmonary arterial tree to suggest pulmonary embolus. Coronary, aortic arch, and branch vessel atherosclerotic vascular disease. Mild cardiomegaly. Mediastinum/Nodes: Right paratracheal node/mass 3.1 cm in short axis on image 49 series 5. Prevascular node 1.6 cm in short axis, image 48 series 5. Right hilar lymph node 2.5 cm in short axis, image 62 series 5. Additional indistinct prevascular lymph nodes noted on the right. Lungs/Pleura: There is a rind of density along the posterior pleural margin along the right lower lobe, suspicious for pleural tumor, about 1.9 cm in thickness on image 100 series 5. Adjacent erosion of the right ninth rib on image 42 series 10, with suspected tumor extension into the chest wall in the eighth intercostal space. Right upper lobe pulmonary nodule 1.8 by 1.2 cm on image 51 series 6. Right lower lobe pleural-based nodule along the major fissure, 0.9  by 0.5 cm on image 84 series 6. Nodularity along the pleural margin of the right posterior diaphragm measuring 2.7 by 1.1 cm on image 73 series 10. Airway thickening is present, suggesting bronchitis or reactive airways disease. Calcified granuloma in the superior segment right lower lobe (benign). Mild perihilar ground-glass opacity in the right upper lobe is indistinct and likely inflammatory on image 50 series 6. Upper Abdomen: Hypodense 1.5 cm lesion in the right hepatic lobe on image 111 of series 5, fluid density and most likely to be a cyst. Thoracic spondylosis. Musculoskeletal: As noted above, the pleural mass on the right is associated with a erosion of the top of the right ninth rib and extension into the right eighth intercostal space compatible chest wall invasion. Review of the MIP images confirms the above findings. IMPRESSION: 1. No filling defect in the pulmonary arterial tree to suggest pulmonary embolus. Reduced caliber of the right upper lobe pulmonary artery due to mass effect from the surrounding tumor. 2. Pleural mass along the right lower lobe posteriorly with erosion of the right ninth rib and extension into the right eighth intercostal space compatible with chest wall invasion. 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 3. Right upper lobe pulmonary nodule 1.8 by 1.2 cm, suspicious for malignancy. 4. Right hilar, right paratracheal, and prevascular adenopathy compatible with malignancy. 5. Airway thickening is present, suggesting bronchitis or reactive airways disease. 6. Mild cardiomegaly. Coronary, aortic arch, and branch vessel atherosclerotic vascular disease. 7. Hypodense 1.5 cm lesion in the right hepatic lobe is fluid density and most likely to be a cyst. 8. Aortic atherosclerosis. 9. Mild perihilar ground-glass opacity in the right upper lobe is indistinct and likely inflammatory. Aortic Atherosclerosis (ICD10-I70.0). Electronically Signed   By: Van Clines  M.D.   On: 10/27/2022 19:57   CT Abdomen Pelvis W Contrast  Result Date: 10/27/2022 CLINICAL DATA:  Abdominal pain EXAM: CT ABDOMEN AND PELVIS WITH CONTRAST TECHNIQUE: Multidetector CT imaging of the abdomen and pelvis was performed using the standard protocol following bolus administration of intravenous contrast. RADIATION DOSE REDUCTION: This exam was performed according to the departmental dose-optimization program which includes automated exposure control, adjustment of the mA and/or kV according to patient size and/or use of iterative reconstruction technique. CONTRAST:  67mL OMNIPAQUE IOHEXOL 350 MG/ML SOLN COMPARISON:  Right upper quadrant ultrasound obtained the same day, hip radiographs 06/30/2022, pelvis radiographs 08/14/2022, lumbar spine MRI 04/14/2021 FINDINGS: Lower chest: There is soft tissue attenuating pleural thickening along the posterior right lower lobe (3-3) measuring up to 1.0 cm in thickness and 5.1 cm transverse. There is additional nodular pleural thickening along  the medial aspect of the right hemidiaphragm measuring up to 2.7 cm x 1.0 cm (6-62). The imaged heart is unremarkable. Hepatobiliary: A 1.5 cm hypodense lesion in the right hepatic lobe most likely reflects a benign cyst requiring no specific imaging follow-up. There are no other focal liver lesions. The gallbladder is unremarkable. There is no biliary ductal dilatation. Pancreas: Unremarkable. Spleen: Unremarkable. Adrenals/Urinary Tract: The adrenals are unremarkable. There are multiple hypodense lesions in Thomas kidneys likely reflecting benign cysts requiring no specific imaging follow-up. There are no suspicious renal lesions. There are no stones in either kidney or along the course of either ureter. There is symmetric excretion of contrast into the collecting systems on the delayed images. The bladder is unremarkable. Stomach/Bowel: The stomach is unremarkable. There is no evidence of bowel obstruction. There is  extensive colonic diverticulosis. There is no convincing evidence of acute diverticulitis or primary colonic neoplasm. Appendix is normal. Vascular/Lymphatic: There is extensive mixed plaque in the nonaneurysmal abdominal aorta. 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 The major aortic branch vessels are patent. The main portal and splenic veins are patent. Reproductive: The prostate and seminal vesicles are grossly unremarkable though largely obscured by streak artifact from the right hip arthroplasty hardware. Other: There is no ascites or free air. There is a small fat containing umbilical hernia. Postsurgical changes are noted along the right ventral abdominal wall. Musculoskeletal: There is a destructive lytic lesion in the right iliac bone with soft tissue extension measuring up to 5.8 cm TV x 4.3 cm AP x 4.7 cm cc. This lesion was not present on the lumbar spine MRI from 04/14/2021. There is a prominent Schmorl's node indenting the inferior L2 endplate. There is no other aggressive or destructive osseous lesion. Right hip arthroplasty hardware is noted. IMPRESSION: 1. 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 2. Soft tissue attenuating pleural thickening along the posterior right lower lobe and along the medial aspect of the right hemidiaphragm are suspicious for pleural metastases. Recommend oncologic workup and dedicated CT chest with contrast. 3. Extensive colonic diverticulosis without evidence of acute diverticulitis. No convincing evidence of primary colonic neoplasm. 4. 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 Recommend  vascular referral and nonemergent CTA abdomen/pelvis. Electronically Signed   By: Valetta Mole M.D.   On: 10/27/2022 12:40   US Abdomen Limited RUQ (LIVER/GB)  Result Date: 10/27/2022 CLINICAL DATA:  RIGHT upper quadrant pain EXAM: ULTRASOUND ABDOMEN LIMITED RIGHT UPPER QUADRANT COMPARISON:  February 2022 FINDINGS: Gallbladder: No gallstones or wall thickening visualized. No sonographic Murphy sign noted by sonographer. Common bile duct: Diameter: 5.5 mm, without intrahepatic biliary duct distension. Liver: Increased hepatic echogenicity without visible lesion on submitted images. Study limited by body habitus. Portal vein is patent on color Doppler imaging with normal direction of blood flow towards the liver. Other:  no ascites IMPRESSION: Hepatic steatosis without acute findings. Electronically Signed   By: Zetta Bills M.D.   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  He presented with pleural-based metastasis in addition to right upper  lobe lung nodule and right hilar and mediastinal lymphadenopathy in addition to lytic lesions in the right iliac bone.   PLAN: I had a lengthy discussion with the patient and his wife today about his current condition and further investigation to confirm his diagnosis. I personally and independently reviewed the imaging studies and discussed results with the patient and his wife. I explained to the patient that we will need several studies for confirmation of his diagnosis and to rule out any other additional metastatic disease. I recommended for the patient to have MRI of the brain to rule out brain metastasis. I will also arrange for the patient to have a PET scan on outpatient basis. I will arrange for the patient to have CT-guided core biopsy of the right iliac soft tissue mass for confirmation of the tissue  diagnosis and if not sufficient, I will refer the patient to pulmonary medicine for consideration of bronchoscopy and biopsy of the lung nodule as well as right hilar and mediastinal lymphadenopathy. I will also request consultation with radiation oncology for palliative radiotherapy to the painful bone lesion. For pain management I will increase his Dilaudid dose to 2 mg every 3 hours as needed for pain.  I will also consult the palliative care team for adjustment of his pain medication. I will continue to monitor the patient closely and arrange for him a follow-up appointment with me at the cancer center for more detailed discussion of his treatment options based on the final pathology and staging workup. The patient and his wife are in agreement with the current plan.  The patient voices understanding of current disease status and treatment options and is in agreement with the current care plan.  All questions were answered. The patient knows to call the clinic with any problems, questions or concerns. We can certainly see the patient much sooner if necessary.  Thank you so much for allowing me to participate in the care of Thomas Thomas. I will continue to follow up the patient with you and assist in his care.   Disclaimer: This note was dictated with voice recognition software. Similar sounding words can inadvertently be transcribed and may not be corrected upon review.   Eilleen Kempf October 28, 2022, 6:05 PM

## 2022-10-28 NOTE — Progress Notes (Signed)
PROGRESS NOTE    Thomas [name]  WUX:[id number] DOB: [birth year] DOA: 10/27/2022 PCP: Michael Boston, MD   Brief Narrative:  HPI: Thomas [name] is a 71 y.o. male with medical history significant of  CVA, HTN,GERD, Gout, HLD ,superior mesenteric artery stenosis who presents to ED with history of progressive RUQ abdominal pain since 9/23. Patient has followed up with pcp and was undergoing outpatient evaluation with CT scan pending however, due to increasing pain presented to ED for further evaluation. Patient notes no associated fever /chills but does note weight loss non-intentional weight loss over the last 2 months. He describes pain as a soreness as well as sharp with radiation to his back.  He denies sob/chest pain / n/v/d/dysuria. 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  Waiting for further recommendations.  Patient will likely benefit from biopsy to find out the primary malignancy.   Probable penetrating atherosclerotic ulcer  -with associated flow-limiting stenosis  -extensive mixed plaque in the nonaneurysmal abdominal aorta  Seen by vascular surgery.  No emergent intervention recommended.  They recommended follow-up with them as outpatient and repeating CTA in 6 months.   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   GERD -ppi     Gout: Continue allopurinol.    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  No concern raised.  DVT prophylaxis: heparin injection 5,000 Units Start: 10/27/22 2330   Code Status: Full Code  Family Communication:  None present at bedside.  Plan of care discussed with patient in length and he/she verbalized understanding and agreed with it.  Status is: Inpatient Remains inpatient appropriate because: Awaiting on-call evaluation.  Might need biopsy.   Estimated body mass index is 27.55 kg/m as calculated from the following:   Height as of this encounter: 5\' 11"  (1.803 m).   Weight as of this encounter: 89.6 kg.    Nutritional Assessment: Body mass index is 27.55 kg/m.Marland Kitchen Seen by dietician.  I agree with the assessment and plan as outlined below: Nutrition Status:        . Skin Assessment: I have examined the patient's skin and I agree with the wound assessment as performed by the wound care RN as outlined below:    Consultants:  Oncology  Procedures:  None  Antimicrobials:  Anti-infectives (From admission, onward)    None         Subjective: Patient seen and examined.  Still complains of abdominal pain, right flank pain and back pain.  No other complaint.  Appears comfortable  though.  He tells me that he has lost at least 30 pounds in about 3 months.  Objective: Vitals:   10/27/22 2145 10/27/22 2224 10/28/22 0347 10/28/22 0808  BP: (!) 153/60 (!) 150/69 (!) 156/61 (!) 164/65  Pulse: 68 80 71 76  Resp: 15 16 17 17   Temp:  98.4 F (36.9 C) 98.6 F (37 C) 98 F (36.7 C)  TempSrc:  Oral Oral Oral  SpO2: 96% 96% 97% 97%  Weight:   89.6 kg   Height:        Intake/Output Summary (Last 24 hours) at  10/28/2022 1029 Last data filed at 10/28/2022 0528 Gross per 24 hour  Intake 427.26 ml  Output --  Net 427.26 ml   Filed Weights   10/27/22 0942 10/28/22 0347  Weight: 88.5 kg 89.6 kg    Examination:  General exam: Appears calm and comfortable  Respiratory system: Clear to auscultation. Respiratory effort normal. Cardiovascular system: S1 & S2 heard, RRR. No JVD, murmurs, rubs, gallops or clicks. No pedal edema. Gastrointestinal system: Abdomen is nondistended, soft and nontender. No organomegaly or masses felt. Normal bowel sounds heard. Central nervous system: Alert and oriented. No focal neurological deficits. Extremities: Symmetric 5 x 5 power. Skin: No rashes, lesions or ulcers Psychiatry: Judgement and insight appear normal. Mood & affect appropriate.    Data Reviewed: I have personally reviewed following labs and imaging studies  CBC: Recent Labs  Lab 10/27/22 1002 10/28/22 0204  WBC 10.5 9.6  NEUTROABS 8.7*  --   HGB 12.8* 11.8*  HCT 41.2 36.6*  MCV 83.6 81.5  PLT 306 124   Basic Metabolic Panel: Recent Labs  Lab 10/27/22 1002 10/28/22 0204  NA 138 139  K 4.0 4.0  CL 100 104  CO2 27 27  GLUCOSE 135* 102*  BUN 16 13  CREATININE 1.04 0.94  CALCIUM 9.3 8.8*   GFR: Estimated Creatinine Clearance: 79 mL/min (by C-G formula based on SCr of 0.94 mg/dL). Liver Function Tests: Recent Labs  Lab 10/27/22 1002 10/28/22 0204  AST 17 14*  ALT 17 15  ALKPHOS 104 93  BILITOT 0.5 0.5  PROT 7.0 6.3*  ALBUMIN 4.1 3.5   Recent Labs  Lab 10/27/22 1002  LIPASE 34   No results for input(s): "AMMONIA" in the last 168 hours. Coagulation Profile: No results for input(s): "INR", "PROTIME" in the last 168 hours. Cardiac Enzymes: No results for input(s): "CKTOTAL", "CKMB", "CKMBINDEX", "TROPONINI" in the last 168 hours. BNP (last 3 results) No results for input(s): "PROBNP" in the last 8760 hours. HbA1C: No results for input(s): "HGBA1C" in the last 72  hours. CBG: No results for input(s): "GLUCAP" in the last 168 hours. Lipid Profile: No results for input(s): "CHOL", "HDL", "LDLCALC", "TRIG", "CHOLHDL", "LDLDIRECT" in the last 72 hours. Thyroid Function Tests: No results for input(s): "TSH", "T4TOTAL", "FREET4", "T3FREE", "THYROIDAB" in the last 72 hours. Anemia Panel: No results for input(s): "VITAMINB12", "FOLATE", "FERRITIN", "TIBC", "IRON", "RETICCTPCT" in the last 72 hours. Sepsis Labs: No results for input(s): "PROCALCITON", "LATICACIDVEN" in the last 168 hours.  No results found for this or any previous visit (from the past 240 hour(s)).   Radiology Studies: CT Angio Chest/Abd/Pel for Dissection W and/or W/WO  Result Date: 10/28/2022 CLINICAL DATA:  Aortic aneurysm suspected or dissection. EXAM: CT ANGIOGRAPHY CHEST, ABDOMEN AND PELVIS TECHNIQUE: Non-contrast CT of the chest was initially obtained. Multidetector CT imaging through the chest, abdomen and pelvis was performed using the standard protocol during bolus administration of  intravenous contrast. Multiplanar reconstructed images and MIPs were obtained and reviewed to evaluate the vascular anatomy. RADIATION DOSE REDUCTION: This exam was performed according to the departmental dose-optimization program which includes automated exposure control, adjustment of the mA and/or kV according to patient size and/or use of iterative reconstruction technique. CONTRAST:  20mL OMNIPAQUE IOHEXOL 350 MG/ML SOLN COMPARISON:  CTA chest and subsequent abdomen and pelvis CT yesterday at 6:50 p.m., CT abdomen and pelvis with IV contrast 01/14/2021. FINDINGS: CTA CHEST FINDINGS Cardiovascular: The heart is slightly enlarged. There is a small anterior pericardial effusion. There are calcifications in the distal left main, proximal LAD and right coronary arteries. The pulmonary arteries and veins are normal in caliber except for again noted encasement and narrowing of the right upper lobe pulmonary artery  by adenopathy. The pulmonary arteries are centrally clear. There is scattered aortic mixed plaque, scattered calcific plaques in the great vessels without stenosis, aneurysm or dissection. Mediastinum/Nodes: Right paratracheal nodal mass measures 4.5 x 3.3 cm on 7:49. A portion of this encases the right upper lobe pulmonary artery with narrowing. A prevascular lymph node right in front of the SVC measures 1.5 x 2.2 cm on 7:51. There is a right hilar lymph node just above the distal right main pulmonary artery measuring 2.4 cm in short axis on 7:61. There is a posterior right hilar node measuring 1.5 cm in short axis on 7:64 and a right mid hilar node of 1 cm in short axis on 7:77. Additional prevascular space node to the right is 2.3 x 1.8 cm on 7:37. No thyroid or axillary mass is seen. The trachea is clear. The thoracic esophagus unremarkable. Lungs/Pleura: Small layering right pleural effusion possibly malignant. Nodular pleural thickening measuring 3.3 x 1.4 cm is noted posterior to the right hemidiaphragm on 7:117 most likely indicating pleural metastasis. Tumor extension into the chest wall is suspected through the right eighth intercostal space posteriorly and there is a rind of pleural thickening in this area as well (see series 7 axial images 93-101). There is a right upper lobe pleural-based pulmonary nodule anteromedially on 6:22 measuring 1.7 x 1.3 cm with small lobulations most likely a neoplasm and possibly the primary neoplasm. In the anterior basal segment of the right lower lobe there is a pleural-based nodule measuring 8 x 6 mm on 6:43. An additional pleural nodular focus suspected on 7:51 in the posterior right upper lung field. There is patchy ground-glass opacity posteriorly in the right upper lobe which could be due to pneumonitis or lymphangitic carcinomatosis. No other focal infiltrate or further nodules are seen. No left pleural effusion. Musculoskeletal: There is erosion in the anterior  aspect of the posterior right ninth rib where the pleural thickening extends through the eighth intercostal space consistent with early metastatic rib destruction. No other focal bone lesion is seen. There is no pathologic fracture. Review of the MIP images confirms the above findings. CTA ABDOMEN AND PELVIS FINDINGS VASCULAR Aorta: There is heavy mixed plaque. The aortic lumen is narrowed to 1 cm focally in the proximal infrarenal segment, otherwise does not show flow limiting stenosis. In the mid infrarenal aorta there is a small saccular aneurysm along the left wall measuring 9 mm at the base and 5 mm in height. There is no dissection. Celiac: There is 75% focal stenosis 1 cm distal to the vessel origin due to compression by the median arcuate ligament of the diaphragm. The vessel otherwise opacifies well. SMA: No flow-limiting stenosis, aneurysm or dissection. Renals: Single  left renal artery is widely patent. There are 2 right renal arteries, the larger of which supplies the upper to midpole, the smaller of which arises anterior to the larger artery and supplies the inferior pole. Both are widely patent. IMA: No flow-limiting stenosis. Inflow: There is mixed plaque in the common iliac arteries with up to 50% stenosis on the left, no stenosis on the right. The internal iliac arteries show patchy calcific plaques without stenosis. Both external iliac arteries are widely patent. Proximal outflow arteries: Small amount of nonstenosing calcification in the right common femoral artery. No other significant findings of the proximal outflow vessels. Veins: Patent portal vein.  Other veins are unopacified. Review of the MIP images confirms the above findings. NON-VASCULAR Hepatobiliary: The liver is 21 cm length with mild steatosis. There is a 1.2 cm cyst in the right lobe laterally. Subcentimeter hypodensity more inferiorly in the right lobe is too small to characterize but was present on both prior studies. No mass  enhancement is seen. Gallbladder is distended, otherwise unremarkable no biliary dilatation. Pancreas: No abnormality. Spleen: No abnormality. Adrenals/Urinary Tract: There is no adrenal mass. There are small bilateral renal cysts and a few nonobstructive caliceal stones in the kidneys. Additional subcentimeter renal hypodensities are too small characterize but no follow-up is recommended. There is no hydronephrosis or obstructing stone. No bladder thickening is seen. Stomach/Bowel: Unremarkable stomach and small bowel, normal appendix. Moderate stool retention ascending colon. Left colonic diverticulosis without evidence of diverticulitis. Lymphatic: No appreciable adenopathy. Reproductive: Mildly enlarged but poorly visualized prostate due to artifact from right hip replacement. Other: Right lower quadrant surgical clips. No free air, hemorrhage or free fluid. Small umbilical fat hernia. Musculoskeletal: Again noted is a destructive lesion of the medial right iliac crest measuring 5.6 x 5.1 cm on 7:235. There is ankylosis across the SI joints. Degenerative changes lumbar spine. Posterior endplate Schmorl's nodes L2-3. No other visible structure bone lesion. Review of the MIP images confirms the above findings. IMPRESSION: 1. Aortic and coronary artery atherosclerosis 2. Small anterior pericardial effusion.  Mild cardiomegaly. 3. No findings of acute aortic syndrome. 4. 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 5. 1.7 x 1.3 cm medial right upper lobe pleural-based nodule most likely a neoplasm and possibly the primary neoplasm. 6. Posterior segment right upper lobe ground-glass opacities consistent with pneumonitis or lymphangitic carcinomatosis. 7. Right hilar and mediastinal adenopathy with a 4.5 x 3.3 cm right paratracheal nodal mass encasing and narrowing the right upper lobe pulmonary artery. 8. 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 9. Metastatic erosion of the anterior aspect of the posterior right ninth rib, and a 5.6 x 5.1 cm destructive lesion of the medial right iliac crest. 10. Nonobstructive nephrolithiasis. 11. Constipation and diverticulosis. 12. 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 13. Additional findings described above. Aortic Atherosclerosis (ICD10-I70.0). Electronically Signed   By: Telford Nab M.D.   On: 10/28/2022 06:37   CT Angio Abd/Pel W and/or Wo Contrast  Result Date: 10/27/2022 CLINICAL DATA:  Mesenteric ischemia, chronic EXAM: CTA ABDOMEN AND PELVIS WITH CONTRAST TECHNIQUE: Multidetector CT imaging of the abdomen and pelvis was performed using the standard protocol during bolus administration of intravenous contrast. Multiplanar reconstructed  images and MIPs were obtained and reviewed to evaluate the vascular anatomy. RADIATION DOSE REDUCTION: This exam was performed according to the departmental dose-optimization program which includes automated exposure control, adjustment of the mA and/or kV according to patient size and/or use of iterative reconstruction technique. CONTRAST:  68mL OMNIPAQUE IOHEXOL 350 MG/ML SOLN COMPARISON:  CT abdomen pelvis 10/27/2022 12:05 p.m., CT abdomen pelvis 01/14/2021 FINDINGS: VASCULAR Aorta: Slightly limited evaluation due to timing of contrast. Severe atherosclerotic plaque. Narrowing of the infrarenal abdominal aorta caliber down to 1.1 cm at the level of the kidneys. Otherwise normal aorta without aneurysm, dissection, vasculitis. Celiac: Patent without evidence of aneurysm, dissection, vasculitis or significant stenosis. SMA: Patent without evidence of aneurysm, dissection, vasculitis or significant stenosis. Renals: Both renal  arteries are patent without evidence of aneurysm, dissection, vasculitis, fibromuscular dysplasia or significant stenosis. IMA: Patent without evidence of aneurysm, dissection, vasculitis or significant stenosis. Inflow: Patent without evidence of aneurysm, dissection, vasculitis or significant stenosis. Proximal Outflow: Bilateral common femoral and visualized portions of the superficial and profunda femoral arteries are patent without evidence of aneurysm, dissection, vasculitis or significant stenosis. Veins: No portal venous or mesenteric venous gas. The main portal, splenic, superior mesenteric veins are patent. Review of the MIP images confirms the above findings. NON-VASCULAR Lower chest: Please see separately dictated CT angiography chest 10/27/2022. Hepatobiliary: There is a 1.2 cm right hepatic lobe fluid density lesion that likely represents a hepatic cyst. Subcentimeter hypodensity too small to characterize no gallstones, gallbladder wall thickening, or pericholecystic fluid. No biliary dilatation. Pancreas: No focal lesion. Normal pancreatic contour. No surrounding inflammatory changes. No main pancreatic ductal dilatation. Spleen: Normal in size without focal abnormality. Adrenals/Urinary Tract: No adrenal nodule bilaterally.  Fluid Bilateral kidneys enhance symmetrically. Density lesions within the kidneys likely represent simple renal cysts. Simple renal cysts, in the absence of clinically indicated signs/symptoms, require no independent follow-up. Subcentimeter hypodensities are too small to characterize-no further follow-up indicated. No hydronephrosis. No hydroureter. The urinary bladder is unremarkable. There is no urothelial wall thickening and there are no filling defects in the opacified portions of the bilateral collecting systems or ureters. Stomach/Bowel: Stomach is within normal limits. No evidence of bowel wall thickening or dilatation. Colonic diverticulosis. Appendix appears normal.  Lymphatic: No lymphadenopathy. Reproductive: The prostate is enlarged measuring up to 5 cm. Other: Surgical clips noted within the right lower quadrant. No intraperitoneal free fluid. No intraperitoneal free gas. No organized fluid collection. Musculoskeletal: Tiny fat containing umbilical hernia. Destructive lytic lesion of the right iliac with associated 5.7 x 4.6 cm soft tissue density. No acute displaced fracture. Multilevel degenerative changes of the spine. Total right hip arthroplasty. IMPRESSION: VASCULAR 1. Aortic Atherosclerosis (ICD10-I70.0) -severe leading to infrarenal abdominal aortic stenosis with caliber down to 1 cm. NON-VASCULAR 1. Colonic diverticulosis with no acute diverticulitis. 2. Prostatomegaly. 3. Destructive lytic lesion of the right iliac with associated 5.7 x 4.6 cm soft tissue density. Finding concerning for malignancy/metastatic disease. 4. Please see separately CT angiography chest 10/27/2022 Electronically Signed   By: Iven Finn M.D.   On: 10/27/2022 20:10   CT Angio Chest PE W and/or Wo Contrast  Result Date: 10/27/2022 CLINICAL DATA:  Worsening right-sided pain, possible pulmonary embolus EXAM: CT ANGIOGRAPHY CHEST WITH CONTRAST TECHNIQUE: Multidetector CT imaging of the chest was performed using the standard protocol during bolus administration of intravenous contrast. Multiplanar CT image reconstructions and MIPs were obtained to evaluate the vascular anatomy. RADIATION DOSE REDUCTION: This exam was performed according to the departmental  dose-optimization program which includes automated exposure control, adjustment of the mA and/or kV according to patient size and/or use of iterative reconstruction technique. CONTRAST:  56mL OMNIPAQUE IOHEXOL 350 MG/ML SOLN COMPARISON:  Chest radiograph 03/22/2020 FINDINGS: Cardiovascular: Reduced caliber of the right upper lobe pulmonary artery due to mass effect from surrounding tumor. No definite filling defect is identified in  the pulmonary arterial tree to suggest pulmonary embolus. Coronary, aortic arch, and branch vessel atherosclerotic vascular disease. Mild cardiomegaly. Mediastinum/Nodes: Right paratracheal node/mass 3.1 cm in short axis on image 49 series 5. Prevascular node 1.6 cm in short axis, image 48 series 5. Right hilar lymph node 2.5 cm in short axis, image 62 series 5. Additional indistinct prevascular lymph nodes noted on the right. Lungs/Pleura: There is a rind of density along the posterior pleural margin along the right lower lobe, suspicious for pleural tumor, about 1.9 cm in thickness on image 100 series 5. Adjacent erosion of the right ninth rib on image 42 series 10, with suspected tumor extension into the chest wall in the eighth intercostal space. Right upper lobe pulmonary nodule 1.8 by 1.2 cm on image 51 series 6. Right lower lobe pleural-based nodule along the major fissure, 0.9 by 0.5 cm on image 84 series 6. Nodularity along the pleural margin of the right posterior diaphragm measuring 2.7 by 1.1 cm on image 73 series 10. Airway thickening is present, suggesting bronchitis or reactive airways disease. Calcified granuloma in the superior segment right lower lobe (benign). Mild perihilar ground-glass opacity in the right upper lobe is indistinct and likely inflammatory on image 50 series 6. Upper Abdomen: Hypodense 1.5 cm lesion in the right hepatic lobe on image 111 of series 5, fluid density and most likely to be a cyst. Thoracic spondylosis. Musculoskeletal: As noted above, the pleural mass on the right is associated with a erosion of the top of the right ninth rib and extension into the right eighth intercostal space compatible chest wall invasion. Review of the MIP images confirms the above findings. IMPRESSION: 1. No filling defect in the pulmonary arterial tree to suggest pulmonary embolus. Reduced caliber of the right upper lobe pulmonary artery due to mass effect from the surrounding tumor. 2. Pleural  mass along the right lower lobe posteriorly with erosion of the right ninth rib and extension into the right eighth intercostal space compatible with chest wall invasion. 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 3. Right upper lobe pulmonary nodule 1.8 by 1.2 cm, suspicious for malignancy. 4. Right hilar, right paratracheal, and prevascular adenopathy compatible with malignancy. 5. Airway thickening is present, suggesting bronchitis or reactive airways disease. 6. Mild cardiomegaly. Coronary, aortic arch, and branch vessel atherosclerotic vascular disease. 7. Hypodense 1.5 cm lesion in the right hepatic lobe is fluid density and most likely to be a cyst. 8. Aortic atherosclerosis. 9. Mild perihilar ground-glass opacity in the right upper lobe is indistinct and likely inflammatory. Aortic Atherosclerosis (ICD10-I70.0). Electronically Signed   By: Van Clines M.D.   On: 10/27/2022 19:57   CT Abdomen Pelvis W Contrast  Result Date: 10/27/2022 CLINICAL DATA:  Abdominal pain EXAM: CT ABDOMEN AND PELVIS WITH CONTRAST TECHNIQUE: Multidetector CT imaging of the abdomen and pelvis was performed using the standard protocol following bolus administration of intravenous contrast. RADIATION DOSE REDUCTION: This exam was performed according to the departmental dose-optimization program which includes automated exposure control, adjustment of the mA and/or kV according to patient size and/or use of iterative reconstruction technique. CONTRAST:  24mL  OMNIPAQUE IOHEXOL 350 MG/ML SOLN COMPARISON:  Right upper quadrant ultrasound obtained the same day, hip radiographs 06/30/2022, pelvis radiographs 08/14/2022, lumbar spine MRI 04/14/2021 FINDINGS: Lower chest: There is soft tissue attenuating pleural thickening along the posterior right lower lobe (3-3) measuring up to 1.0 cm in thickness and 5.1 cm transverse. There is additional nodular pleural thickening along the medial aspect of the right  hemidiaphragm measuring up to 2.7 cm x 1.0 cm (6-62). The imaged heart is unremarkable. Hepatobiliary: A 1.5 cm hypodense lesion in the right hepatic lobe most likely reflects a benign cyst requiring no specific imaging follow-up. There are no other focal liver lesions. The gallbladder is unremarkable. There is no biliary ductal dilatation. Pancreas: Unremarkable. Spleen: Unremarkable. Adrenals/Urinary Tract: The adrenals are unremarkable. There are multiple hypodense lesions in both kidneys likely reflecting benign cysts requiring no specific imaging follow-up. There are no suspicious renal lesions. There are no stones in either kidney or along the course of either ureter. There is symmetric excretion of contrast into the collecting systems on the delayed images. The bladder is unremarkable. Stomach/Bowel: The stomach is unremarkable. There is no evidence of bowel obstruction. There is extensive colonic diverticulosis. There is no convincing evidence of acute diverticulitis or primary colonic neoplasm. Appendix is normal. Vascular/Lymphatic: There is extensive mixed plaque in the nonaneurysmal abdominal aorta. 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 The major aortic branch vessels are patent. The main portal and splenic veins are patent. Reproductive: The prostate and seminal vesicles are grossly unremarkable though largely obscured by streak artifact from the right hip arthroplasty hardware. Other: There is no ascites or free air. There is a small fat containing umbilical hernia. Postsurgical changes are noted along the right ventral abdominal wall. Musculoskeletal: There is a destructive lytic lesion in the right iliac bone with soft tissue extension measuring up to 5.8 cm TV x 4.3 cm AP x 4.7 cm cc. This lesion was not present on the lumbar spine MRI from 04/14/2021. There is a prominent Schmorl's node  indenting the inferior L2 endplate. There is no other aggressive or destructive osseous lesion. Right hip arthroplasty hardware is noted. IMPRESSION: 1. 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 2. Soft tissue attenuating pleural thickening along the posterior right lower lobe and along the medial aspect of the right hemidiaphragm are suspicious for pleural metastases. Recommend oncologic workup and dedicated CT chest with contrast. 3. Extensive colonic diverticulosis without evidence of acute diverticulitis. No convincing evidence of primary colonic neoplasm. 4. 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 Recommend vascular referral and nonemergent CTA abdomen/pelvis. Electronically Signed   By: Valetta Mole M.D.   On: 10/27/2022 12:40   US Abdomen Limited RUQ (LIVER/GB)  Result Date: 10/27/2022 CLINICAL DATA:  RIGHT upper quadrant pain EXAM: ULTRASOUND ABDOMEN LIMITED RIGHT UPPER QUADRANT COMPARISON:  February 2022 FINDINGS: Gallbladder: No gallstones or wall thickening visualized. No sonographic Murphy sign noted by sonographer. Common bile duct: Diameter: 5.5 mm, without intrahepatic biliary duct distension. Liver: Increased hepatic echogenicity without visible lesion on submitted images. Study limited by body habitus. Portal vein is patent on color Doppler imaging with normal direction of blood flow towards the liver. Other:  no ascites  IMPRESSION: Hepatic steatosis without acute findings. Electronically Signed   By: Zetta Bills M.D.   On: 10/27/2022 10:34    Scheduled Meds:  acetaminophen  1,000 mg Oral Q8H   Alirocumab  1 Pen Subcutaneous Q14 Days   allopurinol  300 mg Oral Daily   gabapentin  900 mg Oral QHS   heparin   5,000 Units Subcutaneous Q8H   irbesartan  300 mg Oral Daily   pantoprazole  40 mg Oral BID   Continuous Infusions:  sodium chloride 75 mL/hr at 10/28/22 0528     LOS: 1 day   Darliss Cheney, MD [facility]  10/28/2022, 10:29 AM   *Please note that this is a verbal dictation therefore any spelling or grammatical errors are due to the "[employer]" system interpretation.  Please page via [place] and do not message via secure chat for urgent patient care matters. Secure chat can be used for non urgent patient care matters.  How to contact the [facility] Attending or Consulting provider [place] or covering provider during after hours [place], for this patient?  Check the care team in [facility] and look for a) attending/consulting [facility] provider listed and b) the [facility] team listed. Page or secure chat [phone]. Log into [email or web address] and use [place]'s universal password to access. If you do not have the password, please contact the hospital operator. Locate the [facility] provider you are looking for under [facility] and page to a number that you can be directly reached. If you still have difficulty reaching the provider, please page the [facility] (Director on Call) for the Hospitalists listed on amion for assistance.

## 2022-10-28 NOTE — Consult Note (Signed)
Hospital Consult    Reason for Consult: PAU abdominal aorta Referring Physician: Hospitalist MRN #:  [id number]  History of Present Illness: This is a 71 y.o. 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  Patient states he has had persistent pain since at least September on the right side of his abdomen and into his back.  He has no pain in his legs when he walks.  CT scan with evidence of metastatic disease with destructive lytic lesion in the right iliac bone.    Past Medical History:  Diagnosis Date   Abdominal wall pain 04/02/2021   Acute ischemic stroke ([place]) 03/22/2020   Avulsed toenail, initial encounter 05/04/2018   Dyspnea on exertion 02/10/2020   ED (erectile dysfunction)    Epidermoid cyst of neck 10/16/2017   Essential hypertension 07/21/2017   GERD (gastroesophageal reflux disease)    Gout of multiple sites 07/21/2017   History of kidney stones    History of squamous cell carcinoma excision    Hyperlipidemia    Hypertension not at goal 01/19/2014   It band syndrome, left 03/19/2020   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/24/2020   Left ureteral calculus    Lower abdominal pain 08/27/2015   Metatarsalgia of both feet 03/19/2020   Multiple joint pain 07/16/2018   Pain in joint, shoulder region 12/11/2015   Right flank pain 04/02/2021   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   Urgency of urination    URI (upper respiratory infection) 11/08/2014   Wears glasses     Past Surgical History:  Procedure Laterality Date   COLONOSCOPY WITH PROPOFOL  2017   CYSTOSCOPY/RETROGRADE/URETEROSCOPY/STONE EXTRACTION WITH BASKET Left 04/16/2018   Procedure: CYSTOSCOPY/RETROGRADE/URETEROSCOPY/STONE EXTRACTION WITH BASKET/ HOLMIUM LASER LITHOTRIPSY/ STENT PLACEMENT;  Surgeon: Ardis Hughs,  MD;  [facility];  Service: Urology;  Laterality: Left;   HERNIA REPAIR Right 2022   inguinal   HOLMIUM LASER APPLICATION Left 82/50/[id number]   Procedure: HOLMIUM LASER APPLICATION;  Surgeon: Ardis Hughs, MD;  Location: [facility];  Service: Urology;  Laterality: Left;   TOTAL HIP ARTHROPLASTY Right 06/30/2022   Procedure: RIGHT TOTAL HIP ARTHROPLASTY ANTERIOR APPROACH;  Surgeon: Leandrew Koyanagi, MD;  Location: [facility];  Service: Orthopedics;  Laterality: Right;  3-C    Allergies  Allergen Reactions   Statins     Jaw tightness and severe muscle pain- Pravastatin     Prior to Admission medications   Medication Sig Start Date End Date Taking? Authorizing Provider  acetaminophen (TYLENOL) 650 MG CR tablet Take 1,300 mg by mouth in the morning and at bedtime.   Yes [provider]  Alirocumab (PRALUENT) 150 MG/ML SOAJ Inject 1 Pen into the skin every 14 (fourteen) days. Patient taking differently: Inject 1 Pen into the skin every 14 (fourteen) days. Every other Sunday 08/22/22  Yes Park Liter, MD  allopurinol (ZYLOPRIM) 300 MG tablet Take 300 mg by mouth daily. 05/14/20  Yes [provider]  amLODipine (NORVASC) 5 MG tablet Take 1 tablet (5 mg total) by mouth daily. Patient taking differently: Take 5 mg by mouth every evening. 06/06/22  Yes Weaver, Scott T, PA-C  docusate sodium (COLACE) 100 MG capsule Take 1 capsule (100 mg total) by mouth daily as needed. Patient taking differently: Take 100  mg by mouth daily as needed for mild constipation or moderate constipation. 06/22/22 06/22/23 Yes Aundra Dubin, PA-C  gabapentin (NEURONTIN) 300 MG capsule Take 900 mg by mouth at bedtime.   Yes [provider]  HYDROcodone-acetaminophen (NORCO/VICODIN) 5-325 MG tablet Take 1 tablet by mouth 2 (two) times daily as needed (for breakthrough pain). 10/07/22  Yes [provider]  ibuprofen (ADVIL) 200 MG tablet Take 600 mg by  mouth every 8 (eight) hours as needed for moderate pain.   Yes [provider]  methocarbamol (ROBAXIN) 750 MG tablet Take 1 tablet (750 mg total) by mouth every 8 (eight) hours as needed for muscle spasms. 09/29/22  Yes Hudnall, Sharyn Lull, MD  minoxidil (LONITEN) 10 MG tablet Take 1-1.5 tablets (10-15 mg total) by mouth 2 (two) times daily. TAKE 1 TABLET IN THE MORNING AND TAKE 1/2 TABLET AT NIGHT Patient taking differently: Take 10 mg by mouth 2 (two) times daily. 09/12/22  Yes Park Liter, MD  ondansetron (ZOFRAN) 4 MG tablet Take 1 tablet (4 mg total) by mouth every 8 (eight) hours as needed for nausea or vomiting. 10/03/22  Yes Hudnall, Sharyn Lull, MD  pantoprazole (PROTONIX) 40 MG tablet TAKE 1 TABLET BY MOUTH TWICE A DAY Patient taking differently: Take 40 mg by mouth 2 (two) times daily. 08/04/22  Yes Mansouraty, Telford Nab., MD  PRESCRIPTION MEDICATION Apply 1 Application topically at bedtime as needed (pain). Compounded pain cream ([place])   Yes [provider]  traMADol (ULTRAM) 50 MG tablet Take 1-2 tablets (50-100 mg total) by mouth daily as needed. Patient taking differently: Take 150 mg by mouth daily as needed for moderate pain or severe pain. 07/07/22  Yes Leandrew Koyanagi, MD  valsartan (DIOVAN) 320 MG tablet TAKE 1 TABLET BY MOUTH EVERY DAY Patient taking differently: Take 320 mg by mouth daily. 08/20/22  Yes Park Liter, MD  aspirin EC 81 MG tablet Take 1 tablet (81 mg total) by mouth 2 (two) times daily. To be taken after surgery to prevent blood clots Patient not taking: Reported on 10/27/2022 06/22/22 06/22/23  Aundra Dubin, PA-C    Social History   Socioeconomic History   Marital status: Married    Spouse name: [name]   Number of children: 2   Years of education: Not on file   Highest education level: Not on file  Occupational History   Occupation: teacher  Tobacco Use   Smoking status: Former    Packs/day: 0.30    Years: 20.00     Total pack years: 6.00    Types: Cigarettes    Quit date: 10/17/1994    Years since quitting: 28.0   Smokeless tobacco: Never  Vaping Use   Vaping Use: Not on file  Substance and Sexual Activity   Alcohol use: Not Currently   Drug use: No   Sexual activity: Yes  Other Topics Concern   Not on file  Social History Narrative   Exercise 1 hour a day ---3-4 days a week   Social Determinants of Health   Financial Resource Strain: Not on file  Food Insecurity: No Food Insecurity (10/27/2022)   Hunger Vital Sign    Worried About Running Out of Food in the Last Year: Never true    Ran Out of Food in the Last Year: Never true  Transportation Needs: No Transportation Needs (10/27/2022)   PRAPARE - Transportation    Lack of Transportation (Medical): No    Lack of  Transportation (Non-Medical): No  Physical Activity: Not on file  Stress: Not on file  Social Connections: Not on file  Intimate Partner Violence: Not At Risk (10/27/2022)   Humiliation, Afraid, Rape, and Kick questionnaire    Fear of Current or Ex-Partner: No    Emotionally Abused: No    Physically Abused: No    Sexually Abused: No     Family History  Problem Relation Age of Onset   Heart disease Mother 36   Alcohol abuse Mother    Depression Mother    Hypertension Father    Sleep apnea Father    Cancer Sister        hodgkins, breast   Colon cancer Neg Hx    Pancreatic cancer Neg Hx    Rectal cancer Neg Hx    Stomach cancer Neg Hx    Esophageal cancer Neg Hx    Inflammatory bowel disease Neg Hx    Liver disease Neg Hx     ROS: [x]  Positive   [ ]  Negative   [ ]  All sytems reviewed and are negative  Cardiovascular: []  chest pain/pressure []  palpitations []  SOB lying flat []  DOE []  pain in legs while walking []  pain in legs at rest []  pain in legs at night []  non-healing ulcers []  hx of DVT []  swelling in legs  Pulmonary: []  productive cough []  asthma/wheezing []  home O2  Neurologic: []   weakness in []  arms []  legs []  numbness in []  arms []  legs []  hx of CVA []  mini stroke [] difficulty speaking or slurred speech []  temporary loss of vision in one eye []  dizziness  Hematologic: []  hx of cancer []  bleeding problems []  problems with blood clotting easily  Endocrine:   []  diabetes []  thyroid disease  GI []  vomiting blood []  blood in stool  GU: []  CKD/renal failure []  HD--[]  M/W/F or []  T/T/S []  burning with urination []  blood in urine  Psychiatric: []  anxiety []  depression  Musculoskeletal: []  arthritis []  joint pain  Integumentary: []  rashes []  ulcers  Constitutional: []  fever []  chills   Physical Examination  Vitals:   10/27/22 2224 10/28/22 0347  BP: (!) 150/69 (!) 156/61  Pulse: 80 71  Resp: 16 17  Temp: 98.4 F (36.9 C) 98.6 F (37 C)  SpO2: 96% 97%   Body mass index is 27.55 kg/m.  General:  NAD Gait: Not observed HENT: WNL, normocephalic Pulmonary: normal non-labored breathing Cardiac: regular, without  Murmurs, rubs or gallops Abdomen:  soft, mild right sided pain Vascular Exam/Pulses: Bilateral femoral pulses palpable Bilateral PT pulses palpable Extremities: without ischemic changes Musculoskeletal: no muscle wasting or atrophy  Neurologic: A&O X 3; Appropriate Affect ; SENSATION: normal; MOTOR FUNCTION:  moving all extremities equally. Speech is fluent/normal   CBC    Component Value Date/Time   WBC 9.6 10/28/2022 0204   RBC 4.49 10/28/2022 0204   HGB 11.8 (L) 10/28/2022 0204   HCT 36.6 (L) 10/28/2022 0204   PLT 285 10/28/2022 0204   MCV 81.5 10/28/2022 0204   MCH 26.3 10/28/2022 0204   MCHC 32.2 10/28/2022 0204   RDW 14.8 10/28/2022 0204   LYMPHSABS 1.2 10/27/2022 1002   MONOABS 0.5 10/27/2022 1002   EOSABS 0.1 10/27/2022 1002   BASOSABS 0.0 10/27/2022 1002    BMET    Component Value Date/Time   NA 139 10/28/2022 0204   NA 140 06/07/2021 0952   K 4.0 10/28/2022 0204   CL 104 10/28/2022 0204   CO2 27  10/28/2022 0204   GLUCOSE 102 (H) 10/28/2022 0204   BUN 13 10/28/2022 0204   BUN 17 06/07/2021 0952   CREATININE 0.94 10/28/2022 0204   CREATININE 0.92 11/14/2016 1652   CALCIUM 8.8 (L) 10/28/2022 0204   GFRNONAA >60 10/28/2022 0204   GFRAA 76 08/24/2020 1351    COAGS: Lab Results  Component Value Date   INR 0.9 03/22/2020     Non-Invasive Vascular Imaging:    CTA reviewed that shows 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  ASSESSMENT/PLAN: This is a 71 y.o. 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  I discussed with the patient that this is an incidental finding.  It does not explain his clinical presentation.  Unfortunately he has had chronic right-sided back, hip, and abdominal pain with a large destructive lesion in the right iliac crest.  I think this explains his symptoms.  He also has likely evidence of metastatic disease.  Discussed we will follow his penetrating aortic ulcer in the office for ongoing aneurysmal degeneration.  This does not require treatment at this time.  I will repeat a CTA in 6 months.  Will need to monitor for aneurysmal degeneration.  He has no lower extremity claudication symptoms to suggest this is flow-limiting and has palpable pulses on exam in his lower extremities.  Marty Heck, MD [facility] Office: [place]

## 2022-10-28 NOTE — Progress Notes (Signed)
PT Cancellation Note  Patient Details Name: Thomas [name] MRN: [id number] DOB: July 10, 1951   Cancelled Treatment:    Reason Eval/Treat Not Completed: Other (comment).  Pt feels he is at baseline for mobility and declines PT visit.  Agreed to let nursing know if his mobility began to decline so PT can return for visit.  Signing off for now.   Ramond Dial 10/28/2022, 1:43 PM  Mee Hives, PT PhD Acute Rehab Dept. Number: [place] and [place]

## 2022-10-29 ENCOUNTER — Ambulatory Visit
Admission: RE | Admit: 2022-10-29 | Discharge: 2022-10-29 | Disposition: A | Discharge status: Home / Self Care | Payer: Medicare HMO | Source: Ambulatory Visit | Attending: Radiation Oncology | Admitting: Radiation Oncology

## 2022-10-29 ENCOUNTER — Inpatient Hospital Stay (HOSPITAL_COMMUNITY): Payer: Medicare HMO

## 2022-10-29 ENCOUNTER — Ambulatory Visit
Admit: 2022-10-29 | Discharge: 2022-10-29 | Disposition: A | Discharge status: Home / Self Care | Payer: Medicare HMO | Attending: Radiation Oncology | Admitting: Radiation Oncology

## 2022-10-29 DIAGNOSIS — Z510 Encounter for antineoplastic radiation therapy: Secondary | ICD-10-CM | POA: Insufficient documentation

## 2022-10-29 DIAGNOSIS — M898X5 Other specified disorders of bone, thigh: Secondary | ICD-10-CM

## 2022-10-29 DIAGNOSIS — C7951 Secondary malignant neoplasm of bone: Secondary | ICD-10-CM | POA: Insufficient documentation

## 2022-10-29 DIAGNOSIS — R222 Localized swelling, mass and lump, trunk: Secondary | ICD-10-CM

## 2022-10-29 DIAGNOSIS — Z515 Encounter for palliative care: Secondary | ICD-10-CM | POA: Diagnosis not present

## 2022-10-29 DIAGNOSIS — R52 Pain, unspecified: Secondary | ICD-10-CM | POA: Diagnosis not present

## 2022-10-29 DIAGNOSIS — C3411 Malignant neoplasm of upper lobe, right bronchus or lung: Secondary | ICD-10-CM | POA: Insufficient documentation

## 2022-10-29 LAB — COMPREHENSIVE METABOLIC PANEL
ALT: 17 U/L (ref 0–44)
AST: 17 U/L (ref 15–41)
Albumin: 3.6 g/dL (ref 3.5–5.0)
Alkaline Phosphatase: 89 U/L (ref 38–126)
Anion gap: 9 (ref 5–15)
BUN: 16 mg/dL (ref 8–23)
CO2: 25 mmol/L (ref 22–32)
Calcium: 8.8 mg/dL — ABNORMAL LOW (ref 8.9–10.3)
Chloride: 107 mmol/L (ref 98–111)
Creatinine, Ser: 1 mg/dL (ref 0.61–1.24)
GFR, Estimated: >60 mL/min (ref >60)
Glucose, Bld: 110 mg/dL — ABNORMAL HIGH (ref 70–99)
Potassium: 4.2 mmol/L (ref 3.5–5.1)
Sodium: 141 mmol/L (ref 135–145)
Total Bilirubin: 0.5 mg/dL (ref 0.3–1.2)
Total Protein: 6.1 g/dL — ABNORMAL LOW (ref 6.5–8.1)

## 2022-10-29 LAB — PROTEIN ELECTROPHORESIS, SERUM
A/G Ratio: 1.3 (ref 0.7–1.7)
Albumin ELP: 3.6 g/dL (ref 2.9–4.4)
Alpha-1-Globulin: 0.3 g/dL (ref 0.0–0.4)
Alpha-2-Globulin: 0.9 g/dL (ref 0.4–1.0)
Beta Globulin: 0.9 g/dL (ref 0.7–1.3)
Gamma Globulin: 0.6 g/dL (ref 0.4–1.8)
Globulin, Total: 2.7 g/dL (ref 2.2–3.9)
Total Protein ELP: 6.3 g/dL (ref 6.0–8.5)

## 2022-10-29 LAB — BASIC METABOLIC PANEL
Anion gap: 10 (ref 5–15)
BUN: 16 mg/dL (ref 8–23)
CO2: 26 mmol/L (ref 22–32)
Calcium: 8.6 mg/dL — ABNORMAL LOW (ref 8.9–10.3)
Chloride: 103 mmol/L (ref 98–111)
Creatinine, Ser: 1.05 mg/dL (ref 0.61–1.24)
GFR, Estimated: >60 mL/min (ref >60)
Glucose, Bld: 111 mg/dL — ABNORMAL HIGH (ref 70–99)
Potassium: 4.1 mmol/L (ref 3.5–5.1)
Sodium: 139 mmol/L (ref 135–145)

## 2022-10-29 MED ORDER — HYDROMORPHONE 1 MG/ML IV SOLN
INTRAVENOUS | Status: DC
  Administered 2022-10-29: 30 mg via INTRAVENOUS
  Administered 2022-10-29: 5 mg via INTRAVENOUS
  Administered 2022-10-30: 2.78 mg via INTRAVENOUS
  Administered 2022-10-30: 2.39 mg via INTRAVENOUS
  Administered 2022-10-30: 3.66 mg via INTRAVENOUS
  Filled 2022-10-29: qty 30

## 2022-10-29 MED ORDER — HYDROMORPHONE HCL 1 MG/ML IJ SOLN
2.0000 mg | INTRAMUSCULAR | Status: DC | PRN
  Administered 2022-10-29 – 2022-10-30 (×2): 2 mg via INTRAVENOUS
  Filled 2022-10-29 (×2): qty 2

## 2022-10-29 MED ORDER — DIPHENHYDRAMINE HCL 12.5 MG/5ML PO ELIX: 12.5000 mg | ORAL_SOLUTION | Freq: Four times a day (QID) | ORAL | Status: DC | PRN

## 2022-10-29 MED ORDER — OXYCODONE HCL 5 MG PO TABS
5.0000 mg | ORAL_TABLET | Freq: Four times a day (QID) | ORAL | Status: DC | PRN
  Administered 2022-10-29: 5 mg via ORAL
  Filled 2022-10-29 (×2): qty 1

## 2022-10-29 MED ORDER — TRAZODONE HCL 50 MG PO TABS
25.0000 mg | ORAL_TABLET | Freq: Every day | ORAL | Status: DC
  Administered 2022-10-29 – 2022-11-01 (×4): 25 mg via ORAL
  Filled 2022-10-29 (×4): qty 1

## 2022-10-29 MED ORDER — DOCUSATE SODIUM 100 MG PO CAPS
100.0000 mg | ORAL_CAPSULE | Freq: Two times a day (BID) | ORAL | Status: DC
  Administered 2022-10-29 – 2022-10-30 (×2): 100 mg via ORAL
  Filled 2022-10-29 (×2): qty 1

## 2022-10-29 MED ORDER — HYDROMORPHONE HCL 1 MG/ML IJ SOLN
2.0000 mg | INTRAMUSCULAR | Status: AC
  Administered 2022-10-29: 2 mg via INTRAVENOUS

## 2022-10-29 MED ORDER — SENNA 8.6 MG PO TABS
1.0000 | ORAL_TABLET | Freq: Every day | ORAL | Status: DC
  Administered 2022-10-30 – 2022-10-31 (×2): 8.6 mg via ORAL
  Filled 2022-10-29 (×2): qty 1

## 2022-10-29 MED ORDER — LIDOCAINE 5 % EX PTCH
1.0000 | MEDICATED_PATCH | CUTANEOUS | Status: DC
  Administered 2022-10-30: 1 via TRANSDERMAL
  Filled 2022-10-29 (×4): qty 1

## 2022-10-29 MED ORDER — SODIUM CHLORIDE 0.9% FLUSH: 9.0000 mL | INTRAVENOUS | Status: DC | PRN

## 2022-10-29 MED ORDER — LORAZEPAM 2 MG/ML IJ SOLN
0.5000 mg | Freq: Once | INTRAMUSCULAR | Status: DC
  Filled 2022-10-29: qty 1

## 2022-10-29 MED ORDER — POLYETHYLENE GLYCOL 3350 17 G PO PACK
17.0000 g | PACK | Freq: Every day | ORAL | Status: DC
  Administered 2022-10-31: 17 g via ORAL
  Filled 2022-10-29 (×2): qty 1

## 2022-10-29 MED ORDER — ACETAMINOPHEN 325 MG PO TABS
650.0000 mg | ORAL_TABLET | Freq: Three times a day (TID) | ORAL | Status: DC
  Administered 2022-10-29 – 2022-11-02 (×11): 650 mg via ORAL
  Filled 2022-10-29 (×11): qty 2

## 2022-10-29 MED ORDER — DIPHENHYDRAMINE HCL 50 MG/ML IJ SOLN: 12.5000 mg | Freq: Four times a day (QID) | INTRAMUSCULAR | Status: DC | PRN

## 2022-10-29 MED ORDER — NALOXONE HCL 0.4 MG/ML IJ SOLN: 0.4000 mg | INTRAMUSCULAR | Status: DC | PRN

## 2022-10-29 MED ORDER — HYDROMORPHONE HCL 1 MG/ML IJ SOLN
1.0000 mg | Freq: Once | INTRAMUSCULAR | Status: AC | PRN
  Administered 2022-10-29: 1 mg via INTRAVENOUS
  Filled 2022-10-29: qty 1

## 2022-10-29 NOTE — Progress Notes (Addendum)
PROGRESS NOTE    Thomas [name]  UVO:[id number] DOB: [birth year] DOA: 10/27/2022 PCP: Michael Boston, MD   Brief Narrative: 89 past medical history significant for CVA with residual neuropathic pain, hypertension, GERD, gout, hyperlipidemia, superior mesenteric artery stenosis presents to the ED with history of progressive right upper quadrant abdominal pain since 9/23.  He has follow-up with his PCP and was undergoing outpatient evaluation with CT scan pending however he presented to the ED with worsening pain.  He report weight loss nonintentional for the last 2 months.  He was found to have new destructive lytic lesion in the right iliac bone with concern for metastatic disease, medial right upper lobe pleural-based nodule most likely neoplasm of the lung.  He was also found to have a probably penetrating atherosclerotic ulcer, evaluated by vascular surgery who recommend repeat CTA in 6 months.  He was evaluated by Dr. Earlie Server 12/12 who recommended CT-guided biopsy of the lytic lesion, radiation oncology consultation and MRI of the brain.  IR will help arrange biopsy  at  [facility] or at [place] [facility] depending on availability.  He will be transferred to [facility] to start radiation simulation and hopefully radiation treatment.   Assessment & Plan:   Principal Problem:   Intractable pain Active Problems:   Essential hypertension   Superior mesenteric artery stenosis (HCC)   Hyperlipidemia   Lytic bone lesion of hip   Penetrating atherosclerotic ulcer of aorta (HCC)  1-New destructive lytic lesion in the right iliac bone concerning for metastatic disease Medial right upper lobe pleural-based nodule -CTA: Reduced caliber of the right upper lobe pulmonary artery due to mass effect from the surrounding tumor. Pleural mass along the right lower lobe posteriorly with erosion of the right ninth rib and extension into the right eighth intercostal space compatible with chest wall invasion.  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 Right upper lobe pulmonary nodule 1.8 by 1.2 cm, suspicious for malignancy.Right hilar, right paratracheal, and prevascular adenopathy compatible with malignancy. -Plan for CT-guided biopsy of the right iliac lesion.  -IR will help arrange Bx here at [facility] or at [facility] depending on availability and patient location.  -Palliative care consulted for pain management. They will see patient today.  -Lidocaine patch ordered.  -IV dilaudid Q 3 Hour PRN  -plan to transfer to [facility] for Radiation simulation.  -Will reduce tylenol to 650 mg TID. Check LFT in am.   2-Probably penetrating atherosclerotic Ulcer on CT scan;  Evaluated by Vascular. He needs CTA follow up in 6 month.  CTA;  Severe atherosclerotic plaque. Narrowing of the infrarenal abdominal aorta caliber down to 1.1 cm at the level of the kidneys  3-History of CVA, residual neuropathic pain.  Continue with Gabapentin.   HTN; Continue with Norvasc and Avapro.   GERD; Continue with PPI.   HLD; on statins.  History of Superior mesenteric artery stenosis.  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 No concern raised.   Constipation;  Start Miralax, senna, docusate to prevent constipation in setting of opioid use.  Nutrition Problem: Increased nutrient needs Etiology: acute illness    Signs/Symptoms: estimated needs    Interventions: Ensure Enlive (each supplement provides 350kcal and 20 grams of protein), MVI, Snacks  Estimated body mass index is 27.55 kg/m as calculated from the following:   Height as of this encounter: 5\' 11"  (1.803 m).   Weight as of this encounter: 89.6 kg.   DVT  prophylaxis: Heparin Code Status: Full code Family Communication: Family at bedside.  Disposition Plan:  Status is: Inpatient Remains inpatient appropriate because: management of iliac lesion, pain management. Undergoing work up.      Consultants:  Oncology, Dr Julien Nordmann.  Radiation Oncology.  IR>   Procedures:    Antimicrobials:    Subjective: He is alert. Report still having pain, pain medication doesn't last the 3 hours. He has used at home lidocaine and warm bottle water apply to area. He agrees to transfer to [place] [facility] for radiation simulation and treatment if needed.   Will try dilaudid prior to MRI to help with pain and anxiety.  He is concern about   Objective: Vitals:   10/28/22 1643 10/28/22 2300 10/29/22 0450 10/29/22 0735  BP: (!) 156/67 (!) 153/58 135/71 (!) 157/64  Pulse: 68 71 70 72  Resp: 17 18 18 16   Temp: 98.3 F (36.8 C) 98.3 F (36.8 C) 98.5 F (36.9 C) 98.3 F (36.8 C)  TempSrc: Oral Oral Oral Oral  SpO2: 97% 94% 98% 97%  Weight:      Height:        Intake/Output Summary (Last 24 hours) at 10/29/2022 0801 Last data filed at 10/28/2022 2300 Gross per 24 hour  Intake 240 ml  Output --  Net 240 ml   Filed Weights   10/27/22 0942 10/28/22 0347  Weight: 88.5 kg 89.6 kg    Examination:  General exam: Appears calm and comfortable  Respiratory system: Clear to auscultation. Respiratory effort normal. Cardiovascular system: S1 & S2 heard, RRR. No JVD, murmurs, rubs, gallops or clicks. No pedal edema. Gastrointestinal system: Abdomen is nondistended, soft and nontender. No organomegaly or masses felt. Normal bowel sounds heard. Central nervous system: Alert and oriented.  Extremities: Symmetric 5 x 5 power.     Data Reviewed: I have personally reviewed following labs and imaging studies  CBC: Recent Labs  Lab 10/27/22 1002 10/28/22 0204  WBC 10.5 9.6  NEUTROABS 8.7*  --   HGB 12.8* 11.8*  HCT 41.2 36.6*  MCV 83.6 81.5  PLT 306 353   Basic Metabolic Panel: Recent Labs  Lab 10/27/22 1002 10/28/22 0204  NA 138 139  K 4.0 4.0  CL 100 104  CO2 27 27  GLUCOSE 135* 102*  BUN 16 13  CREATININE 1.04 0.94  CALCIUM 9.3 8.8*   GFR: Estimated Creatinine  Clearance: 79 mL/min (by C-G formula based on SCr of 0.94 mg/dL). Liver Function Tests: Recent Labs  Lab 10/27/22 1002 10/28/22 0204  AST 17 14*  ALT 17 15  ALKPHOS 104 93  BILITOT 0.5 0.5  PROT 7.0 6.3*  ALBUMIN 4.1 3.5   Recent Labs  Lab 10/27/22 1002  LIPASE 34   No results for input(s): "AMMONIA" in the last 168 hours. Coagulation Profile: No results for input(s): "INR", "PROTIME" in the last 168 hours. Cardiac Enzymes: No results for input(s): "CKTOTAL", "CKMB", "CKMBINDEX", "TROPONINI" in the last 168 hours. BNP (last 3 results) No results for input(s): "PROBNP" in the last 8760 hours. HbA1C: No results for input(s): "HGBA1C" in the last 72 hours. CBG: No results for input(s): "GLUCAP" in the last 168 hours. Lipid Profile: No results for input(s): "CHOL", "HDL", "LDLCALC", "TRIG", "CHOLHDL", "LDLDIRECT" in the last 72 hours. Thyroid Function Tests: No results for input(s): "TSH", "T4TOTAL", "FREET4", "T3FREE", "THYROIDAB" in the last 72 hours. Anemia Panel: No results for input(s): "VITAMINB12", "FOLATE", "FERRITIN", "TIBC", "IRON", "RETICCTPCT" in the last 72 hours. Sepsis Labs: No results  for input(s): "PROCALCITON", "LATICACIDVEN" in the last 168 hours.  No results found for this or any previous visit (from the past 240 hour(s)).       Radiology Studies: CT Angio Chest/Abd/Pel for Dissection W and/or W/WO  Result Date: 10/28/2022 CLINICAL DATA:  Aortic aneurysm suspected or dissection. EXAM: CT ANGIOGRAPHY CHEST, ABDOMEN AND PELVIS TECHNIQUE: Non-contrast CT of the chest was initially obtained. Multidetector CT imaging through the chest, abdomen and pelvis was performed using the standard protocol during bolus administration of intravenous contrast. Multiplanar reconstructed images and MIPs were obtained and reviewed to evaluate the vascular anatomy. RADIATION DOSE REDUCTION: This exam was performed according to the departmental dose-optimization program which  includes automated exposure control, adjustment of the mA and/or kV according to patient size and/or use of iterative reconstruction technique. CONTRAST:  65mL OMNIPAQUE IOHEXOL 350 MG/ML SOLN COMPARISON:  CTA chest and subsequent abdomen and pelvis CT yesterday at 6:50 p.m., CT abdomen and pelvis with IV contrast 01/14/2021. FINDINGS: CTA CHEST FINDINGS Cardiovascular: The heart is slightly enlarged. There is a small anterior pericardial effusion. There are calcifications in the distal left main, proximal LAD and right coronary arteries. The pulmonary arteries and veins are normal in caliber except for again noted encasement and narrowing of the right upper lobe pulmonary artery by adenopathy. The pulmonary arteries are centrally clear. There is scattered aortic mixed plaque, scattered calcific plaques in the great vessels without stenosis, aneurysm or dissection. Mediastinum/Nodes: Right paratracheal nodal mass measures 4.5 x 3.3 cm on 7:49. A portion of this encases the right upper lobe pulmonary artery with narrowing. A prevascular lymph node right in front of the SVC measures 1.5 x 2.2 cm on 7:51. There is a right hilar lymph node just above the distal right main pulmonary artery measuring 2.4 cm in short axis on 7:61. There is a posterior right hilar node measuring 1.5 cm in short axis on 7:64 and a right mid hilar node of 1 cm in short axis on 7:77. Additional prevascular space node to the right is 2.3 x 1.8 cm on 7:37. No thyroid or axillary mass is seen. The trachea is clear. The thoracic esophagus unremarkable. Lungs/Pleura: Small layering right pleural effusion possibly malignant. Nodular pleural thickening measuring 3.3 x 1.4 cm is noted posterior to the right hemidiaphragm on 7:117 most likely indicating pleural metastasis. Tumor extension into the chest wall is suspected through the right eighth intercostal space posteriorly and there is a rind of pleural thickening in this area as well (see series 7  axial images 93-101). There is a right upper lobe pleural-based pulmonary nodule anteromedially on 6:22 measuring 1.7 x 1.3 cm with small lobulations most likely a neoplasm and possibly the primary neoplasm. In the anterior basal segment of the right lower lobe there is a pleural-based nodule measuring 8 x 6 mm on 6:43. An additional pleural nodular focus suspected on 7:51 in the posterior right upper lung field. There is patchy ground-glass opacity posteriorly in the right upper lobe which could be due to pneumonitis or lymphangitic carcinomatosis. No other focal infiltrate or further nodules are seen. No left pleural effusion. Musculoskeletal: There is erosion in the anterior aspect of the posterior right ninth rib where the pleural thickening extends through the eighth intercostal space consistent with early metastatic rib destruction. No other focal bone lesion is seen. There is no pathologic fracture. Review of the MIP images confirms the above findings. CTA ABDOMEN AND PELVIS FINDINGS VASCULAR Aorta: There is heavy mixed plaque. The aortic  lumen is narrowed to 1 cm focally in the proximal infrarenal segment, otherwise does not show flow limiting stenosis. In the mid infrarenal aorta there is a small saccular aneurysm along the left wall measuring 9 mm at the base and 5 mm in height. There is no dissection. Celiac: There is 75% focal stenosis 1 cm distal to the vessel origin due to compression by the median arcuate ligament of the diaphragm. The vessel otherwise opacifies well. SMA: No flow-limiting stenosis, aneurysm or dissection. Renals: Single left renal artery is widely patent. There are 2 right renal arteries, the larger of which supplies the upper to midpole, the smaller of which arises anterior to the larger artery and supplies the inferior pole. Both are widely patent. IMA: No flow-limiting stenosis. Inflow: There is mixed plaque in the common iliac arteries with up to 50% stenosis on the left, no  stenosis on the right. The internal iliac arteries show patchy calcific plaques without stenosis. Both external iliac arteries are widely patent. Proximal outflow arteries: Small amount of nonstenosing calcification in the right common femoral artery. No other significant findings of the proximal outflow vessels. Veins: Patent portal vein.  Other veins are unopacified. Review of the MIP images confirms the above findings. NON-VASCULAR Hepatobiliary: The liver is 21 cm length with mild steatosis. There is a 1.2 cm cyst in the right lobe laterally. Subcentimeter hypodensity more inferiorly in the right lobe is too small to characterize but was present on both prior studies. No mass enhancement is seen. Gallbladder is distended, otherwise unremarkable no biliary dilatation. Pancreas: No abnormality. Spleen: No abnormality. Adrenals/Urinary Tract: There is no adrenal mass. There are small bilateral renal cysts and a few nonobstructive caliceal stones in the kidneys. Additional subcentimeter renal hypodensities are too small characterize but no follow-up is recommended. There is no hydronephrosis or obstructing stone. No bladder thickening is seen. Stomach/Bowel: Unremarkable stomach and small bowel, normal appendix. Moderate stool retention ascending colon. Left colonic diverticulosis without evidence of diverticulitis. Lymphatic: No appreciable adenopathy. Reproductive: Mildly enlarged but poorly visualized prostate due to artifact from right hip replacement. Other: Right lower quadrant surgical clips. No free air, hemorrhage or free fluid. Small umbilical fat hernia. Musculoskeletal: Again noted is a destructive lesion of the medial right iliac crest measuring 5.6 x 5.1 cm on 7:235. There is ankylosis across the SI joints. Degenerative changes lumbar spine. Posterior endplate Schmorl's nodes L2-3. No other visible structure bone lesion. Review of the MIP images confirms the above findings. IMPRESSION: 1. Aortic and  coronary artery atherosclerosis 2. Small anterior pericardial effusion.  Mild cardiomegaly. 3. No findings of acute aortic syndrome. 4. 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 5. 1.7 x 1.3 cm medial right upper lobe pleural-based nodule most likely a neoplasm and possibly the primary neoplasm. 6. Posterior segment right upper lobe ground-glass opacities consistent with pneumonitis or lymphangitic carcinomatosis. 7. Right hilar and mediastinal adenopathy with a 4.5 x 3.3 cm right paratracheal nodal mass encasing and narrowing the right upper lobe pulmonary artery. 8. 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 9. Metastatic erosion of the anterior aspect of the posterior right ninth rib, and a 5.6 x 5.1 cm destructive lesion of the medial right iliac crest. 10. Nonobstructive nephrolithiasis. 11. Constipation and diverticulosis. 12.  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 13. Additional findings described above. Aortic Atherosclerosis (ICD10-I70.0). Electronically Signed   By: Telford Nab M.D.   On: 10/28/2022 06:37   CT Angio Abd/Pel W and/or Wo Contrast  Result Date: 10/27/2022 CLINICAL DATA:  Mesenteric ischemia, chronic EXAM: CTA ABDOMEN AND PELVIS WITH CONTRAST TECHNIQUE: Multidetector CT imaging of the abdomen and pelvis was performed using the standard protocol during bolus administration of intravenous contrast. Multiplanar reconstructed images and MIPs were obtained and reviewed to evaluate the vascular anatomy. RADIATION DOSE REDUCTION: This exam was performed according to the departmental dose-optimization program which includes automated exposure control, adjustment of the mA  and/or kV according to patient size and/or use of iterative reconstruction technique. CONTRAST:  76mL OMNIPAQUE IOHEXOL 350 MG/ML SOLN COMPARISON:  CT abdomen pelvis 10/27/2022 12:05 p.m., CT abdomen pelvis 01/14/2021 FINDINGS: VASCULAR Aorta: Slightly limited evaluation due to timing of contrast. Severe atherosclerotic plaque. Narrowing of the infrarenal abdominal aorta caliber down to 1.1 cm at the level of the kidneys. Otherwise normal aorta without aneurysm, dissection, vasculitis. Celiac: Patent without evidence of aneurysm, dissection, vasculitis or significant stenosis. SMA: Patent without evidence of aneurysm, dissection, vasculitis or significant stenosis. Renals: Both renal arteries are patent without evidence of aneurysm, dissection, vasculitis, fibromuscular dysplasia or significant stenosis. IMA: Patent without evidence of aneurysm, dissection, vasculitis or significant stenosis. Inflow: Patent without evidence of aneurysm, dissection, vasculitis or significant stenosis. Proximal Outflow: Bilateral common femoral and visualized portions of the superficial and profunda femoral arteries are patent without evidence of aneurysm, dissection, vasculitis or significant stenosis. Veins: No portal venous or mesenteric venous gas. The main portal, splenic, superior mesenteric veins are patent. Review of the MIP images confirms the above findings. NON-VASCULAR Lower chest: Please see separately dictated CT angiography chest 10/27/2022. Hepatobiliary: There is a 1.2 cm right hepatic lobe fluid density lesion that likely represents a hepatic cyst. Subcentimeter hypodensity too small to characterize no gallstones, gallbladder wall thickening, or pericholecystic fluid. No biliary dilatation. Pancreas: No focal lesion. Normal pancreatic contour. No surrounding inflammatory changes. No main pancreatic ductal dilatation. Spleen: Normal in size without focal abnormality. Adrenals/Urinary Tract: No adrenal nodule  bilaterally.  Fluid Bilateral kidneys enhance symmetrically. Density lesions within the kidneys likely represent simple renal cysts. Simple renal cysts, in the absence of clinically indicated signs/symptoms, require no independent follow-up. Subcentimeter hypodensities are too small to characterize-no further follow-up indicated. No hydronephrosis. No hydroureter. The urinary bladder is unremarkable. There is no urothelial wall thickening and there are no filling defects in the opacified portions of the bilateral collecting systems or ureters. Stomach/Bowel: Stomach is within normal limits. No evidence of bowel wall thickening or dilatation. Colonic diverticulosis. Appendix appears normal. Lymphatic: No lymphadenopathy. Reproductive: The prostate is enlarged measuring up to 5 cm. Other: Surgical clips noted within the right lower quadrant. No intraperitoneal free fluid. No intraperitoneal free gas. No organized fluid collection. Musculoskeletal: Tiny fat containing umbilical hernia. Destructive lytic lesion of the right iliac with associated 5.7 x 4.6 cm soft tissue density. No acute displaced fracture. Multilevel degenerative changes of the spine. Total right hip arthroplasty. IMPRESSION: VASCULAR 1. Aortic Atherosclerosis (ICD10-I70.0) -severe leading to infrarenal abdominal aortic stenosis with caliber down to 1 cm. NON-VASCULAR 1. Colonic diverticulosis with no acute diverticulitis. 2. Prostatomegaly. 3. Destructive lytic lesion of the right iliac with associated 5.7 x 4.6 cm soft tissue density. Finding concerning for malignancy/metastatic disease. 4. Please see separately CT angiography chest  10/27/2022 Electronically Signed   By: Iven Finn M.D.   On: 10/27/2022 20:10   CT Angio Chest PE W and/or Wo Contrast  Result Date: 10/27/2022 CLINICAL DATA:  Worsening right-sided pain, possible pulmonary embolus EXAM: CT ANGIOGRAPHY CHEST WITH CONTRAST TECHNIQUE: Multidetector CT imaging of the chest was  performed using the standard protocol during bolus administration of intravenous contrast. Multiplanar CT image reconstructions and MIPs were obtained to evaluate the vascular anatomy. RADIATION DOSE REDUCTION: This exam was performed according to the departmental dose-optimization program which includes automated exposure control, adjustment of the mA and/or kV according to patient size and/or use of iterative reconstruction technique. CONTRAST:  49mL OMNIPAQUE IOHEXOL 350 MG/ML SOLN COMPARISON:  Chest radiograph 03/22/2020 FINDINGS: Cardiovascular: Reduced caliber of the right upper lobe pulmonary artery due to mass effect from surrounding tumor. No definite filling defect is identified in the pulmonary arterial tree to suggest pulmonary embolus. Coronary, aortic arch, and branch vessel atherosclerotic vascular disease. Mild cardiomegaly. Mediastinum/Nodes: Right paratracheal node/mass 3.1 cm in short axis on image 49 series 5. Prevascular node 1.6 cm in short axis, image 48 series 5. Right hilar lymph node 2.5 cm in short axis, image 62 series 5. Additional indistinct prevascular lymph nodes noted on the right. Lungs/Pleura: There is a rind of density along the posterior pleural margin along the right lower lobe, suspicious for pleural tumor, about 1.9 cm in thickness on image 100 series 5. Adjacent erosion of the right ninth rib on image 42 series 10, with suspected tumor extension into the chest wall in the eighth intercostal space. Right upper lobe pulmonary nodule 1.8 by 1.2 cm on image 51 series 6. Right lower lobe pleural-based nodule along the major fissure, 0.9 by 0.5 cm on image 84 series 6. Nodularity along the pleural margin of the right posterior diaphragm measuring 2.7 by 1.1 cm on image 73 series 10. Airway thickening is present, suggesting bronchitis or reactive airways disease. Calcified granuloma in the superior segment right lower lobe (benign). Mild perihilar ground-glass opacity in the right  upper lobe is indistinct and likely inflammatory on image 50 series 6. Upper Abdomen: Hypodense 1.5 cm lesion in the right hepatic lobe on image 111 of series 5, fluid density and most likely to be a cyst. Thoracic spondylosis. Musculoskeletal: As noted above, the pleural mass on the right is associated with a erosion of the top of the right ninth rib and extension into the right eighth intercostal space compatible chest wall invasion. Review of the MIP images confirms the above findings. IMPRESSION: 1. No filling defect in the pulmonary arterial tree to suggest pulmonary embolus. Reduced caliber of the right upper lobe pulmonary artery due to mass effect from the surrounding tumor. 2. Pleural mass along the right lower lobe posteriorly with erosion of the right ninth rib and extension into the right eighth intercostal space compatible with chest wall invasion. 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 3. Right upper lobe pulmonary nodule 1.8 by 1.2 cm, suspicious for malignancy. 4. Right hilar, right paratracheal, and prevascular adenopathy compatible with malignancy. 5. Airway thickening is present, suggesting bronchitis or reactive airways disease. 6. Mild cardiomegaly. Coronary, aortic arch, and branch vessel atherosclerotic vascular disease. 7. Hypodense 1.5 cm lesion in the right hepatic lobe is fluid density and most likely to be a cyst. 8. Aortic atherosclerosis. 9. Mild perihilar ground-glass opacity in the right upper lobe is indistinct and likely inflammatory. Aortic Atherosclerosis (ICD10-I70.0). Electronically Signed   By: Van Clines  M.D.   On: 10/27/2022 19:57   CT Abdomen Pelvis W Contrast  Result Date: 10/27/2022 CLINICAL DATA:  Abdominal pain EXAM: CT ABDOMEN AND PELVIS WITH CONTRAST TECHNIQUE: Multidetector CT imaging of the abdomen and pelvis was performed using the standard protocol following bolus administration of intravenous contrast. RADIATION DOSE REDUCTION:  This exam was performed according to the departmental dose-optimization program which includes automated exposure control, adjustment of the mA and/or kV according to patient size and/or use of iterative reconstruction technique. CONTRAST:  4mL OMNIPAQUE IOHEXOL 350 MG/ML SOLN COMPARISON:  Right upper quadrant ultrasound obtained the same day, hip radiographs 06/30/2022, pelvis radiographs 08/14/2022, lumbar spine MRI 04/14/2021 FINDINGS: Lower chest: There is soft tissue attenuating pleural thickening along the posterior right lower lobe (3-3) measuring up to 1.0 cm in thickness and 5.1 cm transverse. There is additional nodular pleural thickening along the medial aspect of the right hemidiaphragm measuring up to 2.7 cm x 1.0 cm (6-62). The imaged heart is unremarkable. Hepatobiliary: A 1.5 cm hypodense lesion in the right hepatic lobe most likely reflects a benign cyst requiring no specific imaging follow-up. There are no other focal liver lesions. The gallbladder is unremarkable. There is no biliary ductal dilatation. Pancreas: Unremarkable. Spleen: Unremarkable. Adrenals/Urinary Tract: The adrenals are unremarkable. There are multiple hypodense lesions in both kidneys likely reflecting benign cysts requiring no specific imaging follow-up. There are no suspicious renal lesions. There are no stones in either kidney or along the course of either ureter. There is symmetric excretion of contrast into the collecting systems on the delayed images. The bladder is unremarkable. Stomach/Bowel: The stomach is unremarkable. There is no evidence of bowel obstruction. There is extensive colonic diverticulosis. There is no convincing evidence of acute diverticulitis or primary colonic neoplasm. Appendix is normal. Vascular/Lymphatic: There is extensive mixed plaque in the nonaneurysmal abdominal aorta. 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 The major aortic branch vessels are patent. The main portal and splenic veins are patent. Reproductive: The prostate and seminal vesicles are grossly unremarkable though largely obscured by streak artifact from the right hip arthroplasty hardware. Other: There is no ascites or free air. There is a small fat containing umbilical hernia. Postsurgical changes are noted along the right ventral abdominal wall. Musculoskeletal: There is a destructive lytic lesion in the right iliac bone with soft tissue extension measuring up to 5.8 cm TV x 4.3 cm AP x 4.7 cm cc. This lesion was not present on the lumbar spine MRI from 04/14/2021. There is a prominent Schmorl's node indenting the inferior L2 endplate. There is no other aggressive or destructive osseous lesion. Right hip arthroplasty hardware is noted. IMPRESSION: 1. 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 2. Soft tissue attenuating pleural thickening along the posterior right lower lobe and along the medial aspect of the right hemidiaphragm are suspicious for pleural metastases. Recommend oncologic workup and dedicated CT chest with contrast. 3. Extensive colonic diverticulosis without evidence of acute diverticulitis. No convincing evidence of primary colonic neoplasm. 4. 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 Recommend vascular referral and nonemergent CTA abdomen/pelvis. Electronically Signed   By: Valetta Mole M.D.   On: 10/27/2022 12:40   US Abdomen  Limited RUQ (LIVER/GB)  Result Date: 10/27/2022 CLINICAL DATA:  RIGHT upper quadrant pain EXAM: ULTRASOUND ABDOMEN LIMITED RIGHT UPPER QUADRANT COMPARISON:  February 2022 FINDINGS: Gallbladder: No gallstones or wall  thickening visualized. No sonographic Murphy sign noted by sonographer. Common bile duct: Diameter: 5.5 mm, without intrahepatic biliary duct distension. Liver: Increased hepatic echogenicity without visible lesion on submitted images. Study limited by body habitus. Portal vein is patent on color Doppler imaging with normal direction of blood flow towards the liver. Other:  no ascites IMPRESSION: Hepatic steatosis without acute findings. Electronically Signed   By: Zetta Bills M.D.   On: 10/27/2022 10:34        Scheduled Meds:  acetaminophen  1,000 mg Oral Q8H   Alirocumab  1 Pen Subcutaneous Q14 Days   allopurinol  300 mg Oral Daily   amLODipine  5 mg Oral Daily   feeding supplement  237 mL Oral BID BM   gabapentin  900 mg Oral QHS   heparin  5,000 Units Subcutaneous Q8H   irbesartan  300 mg Oral Daily   pantoprazole  40 mg Oral BID   Continuous Infusions:   LOS: 2 days    Time spent: 35 minutes    Mercia Dowe A Devon Kingdon, MD [facility]   If 7PM-7AM, please contact night-coverage [email or web address]  10/29/2022, 8:01 AM

## 2022-10-29 NOTE — Consult Note (Signed)
Chief Complaint: Patient was seen in consultation today for right iliac lytic lesion   Referring Physician(s): Dr Curt Bears  Supervising Physician: Mir, Sharen Heck  Patient Status: [facility] - In-pt  History of Present Illness: Thomas [name] is a 71 y.o. male with PMH significant for acute ischemic stroke in 2021, hypertension, GERD, and several months of RUQ abdominal pain. Patient was admitted to [facility] on 10/27/22 via the ED for progressive RUQ abdominal pain since September 2023. Patient also reported unintentional weight loss over the prior 2 months. CT Angio Abdomen/Pelvis on 12/11 revealed the presence of a destructive lytic lesions of the right iliac bone and an associated soft tissue density measuring 5.7 x 4.6 cm which was concerning for metastatic disease. CT on 12/12 also revealed the presence of a medial right upper lobe pleural-based nodule which is "possibly the primary neoplasm." IR was consulted to perform image-guided biopsy of right iliac lytic lesion.  Past Medical History:  Diagnosis Date   Abdominal wall pain 04/02/2021   Acute ischemic stroke ([place]) 03/22/2020   Avulsed toenail, initial encounter 05/04/2018   Dyspnea on exertion 02/10/2020   ED (erectile dysfunction)    Epidermoid cyst of neck 10/16/2017   Essential hypertension 07/21/2017   GERD (gastroesophageal reflux disease)    Gout of multiple sites 07/21/2017   History of kidney stones    History of squamous cell carcinoma excision    Hyperlipidemia    Hypertension not at goal 01/19/2014   It band syndrome, left 03/19/2020   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/24/2020   Left ureteral calculus    Lower abdominal pain 08/27/2015   Metatarsalgia of both feet 03/19/2020   Multiple joint pain 07/16/2018   Pain in joint, shoulder region 12/11/2015   Right flank pain 04/02/2021   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   Urgency of urination    URI (upper respiratory infection)  11/08/2014   Wears glasses     Past Surgical History:  Procedure Laterality Date   COLONOSCOPY WITH PROPOFOL  2017   CYSTOSCOPY/RETROGRADE/URETEROSCOPY/STONE EXTRACTION WITH BASKET Left 04/16/2018   Procedure: CYSTOSCOPY/RETROGRADE/URETEROSCOPY/STONE EXTRACTION WITH BASKET/ HOLMIUM LASER LITHOTRIPSY/ STENT PLACEMENT;  Surgeon: Ardis Hughs, MD;  Location: [facility];  Service: Urology;  Laterality: Left;   HERNIA REPAIR Right 2022   inguinal   HOLMIUM LASER APPLICATION Left [id number]   Procedure: HOLMIUM LASER APPLICATION;  Surgeon: Ardis Hughs, MD;  Location: [facility];  Service: Urology;  Laterality: Left;   TOTAL HIP ARTHROPLASTY Right 06/30/2022   Procedure: RIGHT TOTAL HIP ARTHROPLASTY ANTERIOR APPROACH;  Surgeon: Leandrew Koyanagi, MD;  Location: [place];  Service: Orthopedics;  Laterality: Right;  3-C    Allergies: Statins  Medications: Prior to Admission medications   Medication Sig Start Date End Date Taking? Authorizing Provider  acetaminophen (TYLENOL) 650 MG CR tablet Take 1,300 mg by mouth in the morning and at bedtime.   Yes [provider]  Alirocumab (PRALUENT) 150 MG/ML SOAJ Inject 1 Pen into the skin every 14 (fourteen) days. Patient taking differently: Inject 1 Pen into the skin every 14 (fourteen) days. Every other Sunday 08/22/22  Yes Park Liter, MD  allopurinol (ZYLOPRIM) 300 MG tablet Take 300 mg by mouth daily. 05/14/20  Yes [provider]  amLODipine (NORVASC) 5 MG tablet Take 1 tablet (5 mg total) by mouth daily. Patient taking differently: Take 5 mg by mouth every evening. 06/06/22  Yes Kathlen Mody, Scott T, PA-C  docusate  sodium (COLACE) 100 MG capsule Take 1 capsule (100 mg total) by mouth daily as needed. Patient taking differently: Take 100 mg by mouth daily as needed for mild constipation or moderate constipation. 06/22/22 06/22/23 Yes Aundra Dubin, PA-C  gabapentin (NEURONTIN) 300 MG capsule  Take 900 mg by mouth at bedtime.   Yes [provider]  HYDROcodone-acetaminophen (NORCO/VICODIN) 5-325 MG tablet Take 1 tablet by mouth 2 (two) times daily as needed (for breakthrough pain). 10/07/22  Yes [provider]  ibuprofen (ADVIL) 200 MG tablet Take 600 mg by mouth every 8 (eight) hours as needed for moderate pain.   Yes [provider]  methocarbamol (ROBAXIN) 750 MG tablet Take 1 tablet (750 mg total) by mouth every 8 (eight) hours as needed for muscle spasms. 09/29/22  Yes Hudnall, Sharyn Lull, MD  minoxidil (LONITEN) 10 MG tablet Take 1-1.5 tablets (10-15 mg total) by mouth 2 (two) times daily. TAKE 1 TABLET IN THE MORNING AND TAKE 1/2 TABLET AT NIGHT Patient taking differently: Take 10 mg by mouth 2 (two) times daily. 09/12/22  Yes Park Liter, MD  ondansetron (ZOFRAN) 4 MG tablet Take 1 tablet (4 mg total) by mouth every 8 (eight) hours as needed for nausea or vomiting. 10/03/22  Yes Hudnall, Sharyn Lull, MD  pantoprazole (PROTONIX) 40 MG tablet TAKE 1 TABLET BY MOUTH TWICE A DAY Patient taking differently: Take 40 mg by mouth 2 (two) times daily. 08/04/22  Yes Mansouraty, Telford Nab., MD  PRESCRIPTION MEDICATION Apply 1 Application topically at bedtime as needed (pain). Compounded pain cream ([place])   Yes [provider]  traMADol (ULTRAM) 50 MG tablet Take 1-2 tablets (50-100 mg total) by mouth daily as needed. Patient taking differently: Take 150 mg by mouth daily as needed for moderate pain or severe pain. 07/07/22  Yes Leandrew Koyanagi, MD  valsartan (DIOVAN) 320 MG tablet TAKE 1 TABLET BY MOUTH EVERY DAY Patient taking differently: Take 320 mg by mouth daily. 08/20/22  Yes Park Liter, MD  aspirin EC 81 MG tablet Take 1 tablet (81 mg total) by mouth 2 (two) times daily. To be taken after surgery to prevent blood clots Patient not taking: Reported on 10/27/2022 06/22/22 06/22/23  Aundra Dubin, PA-C     Family History   Problem Relation Age of Onset   Heart disease Mother 86   Alcohol abuse Mother    Depression Mother    Hypertension Father    Sleep apnea Father    Cancer Sister        hodgkins, breast   Colon cancer Neg Hx    Pancreatic cancer Neg Hx    Rectal cancer Neg Hx    Stomach cancer Neg Hx    Esophageal cancer Neg Hx    Inflammatory bowel disease Neg Hx    Liver disease Neg Hx     Social History   Socioeconomic History   Marital status: Married    Spouse name: Thomas [name]   Number of children: 2   Years of education: Not on file   Highest education level: Not on file  Occupational History   Occupation: Pharmacist, hospital  Tobacco Use   Smoking status: Former    Packs/day: 0.30    Years: 20.00    Total pack years: 6.00    Types: Cigarettes    Quit date: 10/17/1994    Years since quitting: 28.0   Smokeless tobacco: Never  Vaping Use   Vaping Use: Not on file  Substance and Sexual Activity   Alcohol use: Not Currently   Drug use: No   Sexual activity: Yes  Other Topics Concern   Not on file  Social History Narrative   Exercise 1 hour a day ---3-4 days a week   Social Determinants of Health   Financial Resource Strain: Not on file  Food Insecurity: No Food Insecurity (10/27/2022)   Hunger Vital Sign    Worried About Running Out of Food in the Last Year: Never true    Ran Out of Food in the Last Year: Never true  Transportation Needs: No Transportation Needs (10/27/2022)   PRAPARE - Hydrologist (Medical): No    Lack of Transportation (Non-Medical): No  Physical Activity: Not on file  Stress: Not on file  Social Connections: Not on file    Review of Systems: A 12 point ROS discussed and pertinent positives are indicated in the HPI above.  All other systems are negative.  Review of Systems  Constitutional:  Negative for chills and fever.  Respiratory:  Negative for cough, chest tightness and shortness of breath.   Cardiovascular:  Positive for  chest pain. Negative for leg swelling.       Patient describes right lower chest/right upper abdominal pain, but denies substernal chest pain  Gastrointestinal:  Positive for abdominal pain and nausea. Negative for constipation, diarrhea and vomiting.  Neurological:  Positive for dizziness. Negative for headaches.  Psychiatric/Behavioral:  Negative for confusion.     Vital Signs: BP (!) 157/64 (BP Location: Left Arm)   Pulse 72   Temp 98.3 F (36.8 C) (Oral)   Resp 16   Ht 5\' 11"  (1.803 m)   Wt 197 lb 8.5 oz (89.6 kg)   SpO2 97%   BMI 27.55 kg/m    Physical Exam Vitals reviewed.  Constitutional:      General: He is not in acute distress.    Appearance: He is not ill-appearing.  HENT:     Mouth/Throat:     Mouth: Mucous membranes are moist.  Cardiovascular:     Rate and Rhythm: Normal rate and regular rhythm.     Pulses: Normal pulses.     Heart sounds: Normal heart sounds.  Pulmonary:     Effort: Pulmonary effort is normal.     Breath sounds: Normal breath sounds.  Abdominal:     General: Bowel sounds are normal.     Palpations: Abdomen is soft.     Tenderness: There is abdominal tenderness.  Musculoskeletal:     Right lower leg: No edema.     Left lower leg: No edema.  Skin:    General: Skin is warm and dry.  Neurological:     Mental Status: He is alert and oriented to person, place, and time.  Psychiatric:        [name] and Affect: [name] normal.        Behavior: Behavior normal.     Imaging: CT Angio Chest/Abd/Pel for Dissection W and/or W/WO  Result Date: 10/28/2022 CLINICAL DATA:  Aortic aneurysm suspected or dissection. EXAM: CT ANGIOGRAPHY CHEST, ABDOMEN AND PELVIS TECHNIQUE: Non-contrast CT of the chest was initially obtained. Multidetector CT imaging through the chest, abdomen and pelvis was performed using the standard protocol during bolus administration of intravenous contrast. Multiplanar reconstructed images and MIPs were obtained and reviewed to  evaluate the vascular anatomy. RADIATION DOSE REDUCTION: This exam was performed according to the departmental dose-optimization program which includes automated exposure control,  adjustment of the mA and/or kV according to patient size and/or use of iterative reconstruction technique. CONTRAST:  47mL OMNIPAQUE IOHEXOL 350 MG/ML SOLN COMPARISON:  CTA chest and subsequent abdomen and pelvis CT yesterday at 6:50 p.m., CT abdomen and pelvis with IV contrast 01/14/2021. FINDINGS: CTA CHEST FINDINGS Cardiovascular: The heart is slightly enlarged. There is a small anterior pericardial effusion. There are calcifications in the distal left main, proximal LAD and right coronary arteries. The pulmonary arteries and veins are normal in caliber except for again noted encasement and narrowing of the right upper lobe pulmonary artery by adenopathy. The pulmonary arteries are centrally clear. There is scattered aortic mixed plaque, scattered calcific plaques in the great vessels without stenosis, aneurysm or dissection. Mediastinum/Nodes: Right paratracheal nodal mass measures 4.5 x 3.3 cm on 7:49. A portion of this encases the right upper lobe pulmonary artery with narrowing. A prevascular lymph node right in front of the SVC measures 1.5 x 2.2 cm on 7:51. There is a right hilar lymph node just above the distal right main pulmonary artery measuring 2.4 cm in short axis on 7:61. There is a posterior right hilar node measuring 1.5 cm in short axis on 7:64 and a right mid hilar node of 1 cm in short axis on 7:77. Additional prevascular space node to the right is 2.3 x 1.8 cm on 7:37. No thyroid or axillary mass is seen. The trachea is clear. The thoracic esophagus unremarkable. Lungs/Pleura: Small layering right pleural effusion possibly malignant. Nodular pleural thickening measuring 3.3 x 1.4 cm is noted posterior to the right hemidiaphragm on 7:117 most likely indicating pleural metastasis. Tumor extension into the chest wall is  suspected through the right eighth intercostal space posteriorly and there is a rind of pleural thickening in this area as well (see series 7 axial images 93-101). There is a right upper lobe pleural-based pulmonary nodule anteromedially on 6:22 measuring 1.7 x 1.3 cm with small lobulations most likely a neoplasm and possibly the primary neoplasm. In the anterior basal segment of the right lower lobe there is a pleural-based nodule measuring 8 x 6 mm on 6:43. An additional pleural nodular focus suspected on 7:51 in the posterior right upper lung field. There is patchy ground-glass opacity posteriorly in the right upper lobe which could be due to pneumonitis or lymphangitic carcinomatosis. No other focal infiltrate or further nodules are seen. No left pleural effusion. Musculoskeletal: There is erosion in the anterior aspect of the posterior right ninth rib where the pleural thickening extends through the eighth intercostal space consistent with early metastatic rib destruction. No other focal bone lesion is seen. There is no pathologic fracture. Review of the MIP images confirms the above findings. CTA ABDOMEN AND PELVIS FINDINGS VASCULAR Aorta: There is heavy mixed plaque. The aortic lumen is narrowed to 1 cm focally in the proximal infrarenal segment, otherwise does not show flow limiting stenosis. In the mid infrarenal aorta there is a small saccular aneurysm along the left wall measuring 9 mm at the base and 5 mm in height. There is no dissection. Celiac: There is 75% focal stenosis 1 cm distal to the vessel origin due to compression by the median arcuate ligament of the diaphragm. The vessel otherwise opacifies well. SMA: No flow-limiting stenosis, aneurysm or dissection. Renals: Single left renal artery is widely patent. There are 2 right renal arteries, the larger of which supplies the upper to midpole, the smaller of which arises anterior to the larger artery and supplies the inferior  pole. Both are widely  patent. IMA: No flow-limiting stenosis. Inflow: There is mixed plaque in the common iliac arteries with up to 50% stenosis on the left, no stenosis on the right. The internal iliac arteries show patchy calcific plaques without stenosis. Both external iliac arteries are widely patent. Proximal outflow arteries: Small amount of nonstenosing calcification in the right common femoral artery. No other significant findings of the proximal outflow vessels. Veins: Patent portal vein.  Other veins are unopacified. Review of the MIP images confirms the above findings. NON-VASCULAR Hepatobiliary: The liver is 21 cm length with mild steatosis. There is a 1.2 cm cyst in the right lobe laterally. Subcentimeter hypodensity more inferiorly in the right lobe is too small to characterize but was present on both prior studies. No mass enhancement is seen. Gallbladder is distended, otherwise unremarkable no biliary dilatation. Pancreas: No abnormality. Spleen: No abnormality. Adrenals/Urinary Tract: There is no adrenal mass. There are small bilateral renal cysts and a few nonobstructive caliceal stones in the kidneys. Additional subcentimeter renal hypodensities are too small characterize but no follow-up is recommended. There is no hydronephrosis or obstructing stone. No bladder thickening is seen. Stomach/Bowel: Unremarkable stomach and small bowel, normal appendix. Moderate stool retention ascending colon. Left colonic diverticulosis without evidence of diverticulitis. Lymphatic: No appreciable adenopathy. Reproductive: Mildly enlarged but poorly visualized prostate due to artifact from right hip replacement. Other: Right lower quadrant surgical clips. No free air, hemorrhage or free fluid. Small umbilical fat hernia. Musculoskeletal: Again noted is a destructive lesion of the medial right iliac crest measuring 5.6 x 5.1 cm on 7:235. There is ankylosis across the SI joints. Degenerative changes lumbar spine. Posterior endplate  Schmorl's nodes L2-3. No other visible structure bone lesion. Review of the MIP images confirms the above findings. IMPRESSION: 1. Aortic and coronary artery atherosclerosis 2. Small anterior pericardial effusion.  Mild cardiomegaly. 3. No findings of acute aortic syndrome. 4. 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 5. 1.7 x 1.3 cm medial right upper lobe pleural-based nodule most likely a neoplasm and possibly the primary neoplasm. 6. Posterior segment right upper lobe ground-glass opacities consistent with pneumonitis or lymphangitic carcinomatosis. 7. Right hilar and mediastinal adenopathy with a 4.5 x 3.3 cm right paratracheal nodal mass encasing and narrowing the right upper lobe pulmonary artery. 8. 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 9. Metastatic erosion of the anterior aspect of the posterior right ninth rib, and a 5.6 x 5.1 cm destructive lesion of the medial right iliac crest. 10. Nonobstructive nephrolithiasis. 11. Constipation and diverticulosis. 12. 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 13. Additional findings described above. Aortic Atherosclerosis (ICD10-I70.0). Electronically Signed   By: Telford Nab M.D.   On: 10/28/2022 06:37   CT Angio Abd/Pel W and/or Wo Contrast  Result Date: 10/27/2022 CLINICAL DATA:  Mesenteric ischemia, chronic EXAM: CTA ABDOMEN AND PELVIS WITH CONTRAST TECHNIQUE: Multidetector CT imaging of the abdomen and pelvis was performed using the standard protocol during bolus administration of intravenous contrast. Multiplanar reconstructed images and MIPs were obtained and reviewed to evaluate the vascular anatomy. RADIATION DOSE REDUCTION: This  exam was performed according to the departmental dose-optimization program which includes automated exposure control, adjustment of the mA  and/or kV according to patient size and/or use of iterative reconstruction technique. CONTRAST:  66mL OMNIPAQUE IOHEXOL 350 MG/ML SOLN COMPARISON:  CT abdomen pelvis 10/27/2022 12:05 p.m., CT abdomen pelvis 01/14/2021 FINDINGS: VASCULAR Aorta: Slightly limited evaluation due to timing of contrast. Severe atherosclerotic plaque. Narrowing of the infrarenal abdominal aorta caliber down to 1.1 cm at the level of the kidneys. Otherwise normal aorta without aneurysm, dissection, vasculitis. Celiac: Patent without evidence of aneurysm, dissection, vasculitis or significant stenosis. SMA: Patent without evidence of aneurysm, dissection, vasculitis or significant stenosis. Renals: Both renal arteries are patent without evidence of aneurysm, dissection, vasculitis, fibromuscular dysplasia or significant stenosis. IMA: Patent without evidence of aneurysm, dissection, vasculitis or significant stenosis. Inflow: Patent without evidence of aneurysm, dissection, vasculitis or significant stenosis. Proximal Outflow: Bilateral common femoral and visualized portions of the superficial and profunda femoral arteries are patent without evidence of aneurysm, dissection, vasculitis or significant stenosis. Veins: No portal venous or mesenteric venous gas. The main portal, splenic, superior mesenteric veins are patent. Review of the MIP images confirms the above findings. NON-VASCULAR Lower chest: Please see separately dictated CT angiography chest 10/27/2022. Hepatobiliary: There is a 1.2 cm right hepatic lobe fluid density lesion that likely represents a hepatic cyst. Subcentimeter hypodensity too small to characterize no gallstones, gallbladder wall thickening, or pericholecystic fluid. No biliary dilatation. Pancreas: No focal lesion. Normal pancreatic contour. No surrounding inflammatory changes.  No main pancreatic ductal dilatation. Spleen: Normal in size without focal abnormality. Adrenals/Urinary Tract: No adrenal nodule bilaterally.  Fluid Bilateral kidneys enhance symmetrically. Density lesions within the kidneys likely represent simple renal cysts. Simple renal cysts, in the absence of clinically indicated signs/symptoms, require no independent follow-up. Subcentimeter hypodensities are too small to characterize-no further follow-up indicated. No hydronephrosis. No hydroureter. The urinary bladder is unremarkable. There is no urothelial wall thickening and there are no filling defects in the opacified portions of the bilateral collecting systems or ureters. Stomach/Bowel: Stomach is within normal limits. No evidence of bowel wall thickening or dilatation. Colonic diverticulosis. Appendix appears normal. Lymphatic: No lymphadenopathy. Reproductive: The prostate is enlarged measuring up to 5 cm. Other: Surgical clips noted within the right lower quadrant. No intraperitoneal free fluid. No intraperitoneal free gas. No organized fluid collection. Musculoskeletal: Tiny fat containing umbilical hernia. Destructive lytic lesion of the right iliac with associated 5.7 x 4.6 cm soft tissue density. No acute displaced fracture. Multilevel degenerative changes of the spine. Total right hip arthroplasty. IMPRESSION: VASCULAR 1. Aortic Atherosclerosis (ICD10-I70.0) -severe leading to infrarenal abdominal aortic stenosis with caliber down to 1 cm. NON-VASCULAR 1. Colonic diverticulosis with no acute diverticulitis. 2. Prostatomegaly. 3. Destructive lytic lesion of the right iliac with associated 5.7 x 4.6 cm soft tissue density. Finding concerning for malignancy/metastatic disease. 4. Please see separately CT angiography chest 10/27/2022 Electronically Signed   By: Iven Finn M.D.   On: 10/27/2022 20:10   CT Angio Chest PE W and/or Wo Contrast  Result Date: 10/27/2022 CLINICAL DATA:  Worsening right-sided  pain, possible pulmonary embolus EXAM: CT ANGIOGRAPHY CHEST WITH CONTRAST TECHNIQUE: Multidetector CT imaging of the chest was performed using the standard protocol during bolus administration of intravenous contrast. Multiplanar CT image reconstructions and MIPs were obtained to evaluate the vascular anatomy. RADIATION DOSE REDUCTION: This exam was performed according to the departmental dose-optimization program which includes automated exposure control, adjustment of the mA and/or kV according to patient size and/or use of iterative reconstruction technique. CONTRAST:  23mL OMNIPAQUE IOHEXOL 350 MG/ML SOLN COMPARISON:  Chest  radiograph 03/22/2020 FINDINGS: Cardiovascular: Reduced caliber of the right upper lobe pulmonary artery due to mass effect from surrounding tumor. No definite filling defect is identified in the pulmonary arterial tree to suggest pulmonary embolus. Coronary, aortic arch, and branch vessel atherosclerotic vascular disease. Mild cardiomegaly. Mediastinum/Nodes: Right paratracheal node/mass 3.1 cm in short axis on image 49 series 5. Prevascular node 1.6 cm in short axis, image 48 series 5. Right hilar lymph node 2.5 cm in short axis, image 62 series 5. Additional indistinct prevascular lymph nodes noted on the right. Lungs/Pleura: There is a rind of density along the posterior pleural margin along the right lower lobe, suspicious for pleural tumor, about 1.9 cm in thickness on image 100 series 5. Adjacent erosion of the right ninth rib on image 42 series 10, with suspected tumor extension into the chest wall in the eighth intercostal space. Right upper lobe pulmonary nodule 1.8 by 1.2 cm on image 51 series 6. Right lower lobe pleural-based nodule along the major fissure, 0.9 by 0.5 cm on image 84 series 6. Nodularity along the pleural margin of the right posterior diaphragm measuring 2.7 by 1.1 cm on image 73 series 10. Airway thickening is present, suggesting bronchitis or reactive airways  disease. Calcified granuloma in the superior segment right lower lobe (benign). Mild perihilar ground-glass opacity in the right upper lobe is indistinct and likely inflammatory on image 50 series 6. Upper Abdomen: Hypodense 1.5 cm lesion in the right hepatic lobe on image 111 of series 5, fluid density and most likely to be a cyst. Thoracic spondylosis. Musculoskeletal: As noted above, the pleural mass on the right is associated with a erosion of the top of the right ninth rib and extension into the right eighth intercostal space compatible chest wall invasion. Review of the MIP images confirms the above findings. IMPRESSION: 1. No filling defect in the pulmonary arterial tree to suggest pulmonary embolus. Reduced caliber of the right upper lobe pulmonary artery due to mass effect from the surrounding tumor. 2. Pleural mass along the right lower lobe posteriorly with erosion of the right ninth rib and extension into the right eighth intercostal space compatible with chest wall invasion. 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 3. Right upper lobe pulmonary nodule 1.8 by 1.2 cm, suspicious for malignancy. 4. Right hilar, right paratracheal, and prevascular adenopathy compatible with malignancy. 5. Airway thickening is present, suggesting bronchitis or reactive airways disease. 6. Mild cardiomegaly. Coronary, aortic arch, and branch vessel atherosclerotic vascular disease. 7. Hypodense 1.5 cm lesion in the right hepatic lobe is fluid density and most likely to be a cyst. 8. Aortic atherosclerosis. 9. Mild perihilar ground-glass opacity in the right upper lobe is indistinct and likely inflammatory. Aortic Atherosclerosis (ICD10-I70.0). Electronically Signed   By: Van Clines M.D.   On: 10/27/2022 19:57   CT Abdomen Pelvis W Contrast  Result Date: 10/27/2022 CLINICAL DATA:  Abdominal pain EXAM: CT ABDOMEN AND PELVIS WITH CONTRAST TECHNIQUE: Multidetector CT imaging of the abdomen and  pelvis was performed using the standard protocol following bolus administration of intravenous contrast. RADIATION DOSE REDUCTION: This exam was performed according to the departmental dose-optimization program which includes automated exposure control, adjustment of the mA and/or kV according to patient size and/or use of iterative reconstruction technique. CONTRAST:  39mL OMNIPAQUE IOHEXOL 350 MG/ML SOLN COMPARISON:  Right upper quadrant ultrasound obtained the same day, hip radiographs 06/30/2022, pelvis radiographs 08/14/2022, lumbar spine MRI 04/14/2021 FINDINGS: Lower chest: There is soft tissue attenuating pleural  thickening along the posterior right lower lobe (3-3) measuring up to 1.0 cm in thickness and 5.1 cm transverse. There is additional nodular pleural thickening along the medial aspect of the right hemidiaphragm measuring up to 2.7 cm x 1.0 cm (6-62). The imaged heart is unremarkable. Hepatobiliary: A 1.5 cm hypodense lesion in the right hepatic lobe most likely reflects a benign cyst requiring no specific imaging follow-up. There are no other focal liver lesions. The gallbladder is unremarkable. There is no biliary ductal dilatation. Pancreas: Unremarkable. Spleen: Unremarkable. Adrenals/Urinary Tract: The adrenals are unremarkable. There are multiple hypodense lesions in both kidneys likely reflecting benign cysts requiring no specific imaging follow-up. There are no suspicious renal lesions. There are no stones in either kidney or along the course of either ureter. There is symmetric excretion of contrast into the collecting systems on the delayed images. The bladder is unremarkable. Stomach/Bowel: The stomach is unremarkable. There is no evidence of bowel obstruction. There is extensive colonic diverticulosis. There is no convincing evidence of acute diverticulitis or primary colonic neoplasm. Appendix is normal. Vascular/Lymphatic: There is extensive mixed plaque in the nonaneurysmal abdominal  aorta. 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 The major aortic branch vessels are patent. The main portal and splenic veins are patent. Reproductive: The prostate and seminal vesicles are grossly unremarkable though largely obscured by streak artifact from the right hip arthroplasty hardware. Other: There is no ascites or free air. There is a small fat containing umbilical hernia. Postsurgical changes are noted along the right ventral abdominal wall. Musculoskeletal: There is a destructive lytic lesion in the right iliac bone with soft tissue extension measuring up to 5.8 cm TV x 4.3 cm AP x 4.7 cm cc. This lesion was not present on the lumbar spine MRI from 04/14/2021. There is a prominent Schmorl's node indenting the inferior L2 endplate. There is no other aggressive or destructive osseous lesion. Right hip arthroplasty hardware is noted. IMPRESSION: 1. 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 2. Soft tissue attenuating pleural thickening along the posterior right lower lobe and along the medial aspect of the right hemidiaphragm are suspicious for pleural metastases. Recommend oncologic workup and dedicated CT chest with contrast. 3. Extensive colonic diverticulosis without evidence of acute diverticulitis. No convincing evidence of primary colonic neoplasm. 4. 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 Recommend vascular referral and nonemergent CTA abdomen/pelvis. Electronically Signed   By: Valetta Mole M.D.   On: 10/27/2022 12:40   US Abdomen Limited RUQ (LIVER/GB)  Result Date: 10/27/2022 CLINICAL DATA:  RIGHT upper quadrant  pain EXAM: ULTRASOUND ABDOMEN LIMITED RIGHT UPPER QUADRANT COMPARISON:  February 2022 FINDINGS: Gallbladder: No gallstones or wall thickening visualized. No sonographic Murphy sign noted by sonographer. Common bile duct: Diameter: 5.5 mm, without intrahepatic biliary duct distension. Liver: Increased hepatic echogenicity without visible lesion on submitted images. Study limited by body habitus. Portal vein is patent on color Doppler imaging with normal direction of blood flow towards the liver. Other:  no ascites IMPRESSION: Hepatic steatosis without acute findings. Electronically Signed   By: Zetta Bills M.D.   On: 10/27/2022 10:34    Labs:  CBC: Recent Labs    06/19/22 0843 07/01/22  0700 10/27/22 1002 10/28/22 0204  WBC 8.7 10.6* 10.5 9.6  HGB 14.6 11.6* 12.8* 11.8*  HCT 44.0 35.5* 41.2 36.6*  PLT 268 213 306 285    COAGS: No results for input(s): "INR", "APTT" in the last 8760 hours.  BMP: Recent Labs    06/19/22 0843 10/27/22 1002 10/28/22 0204  NA 138 138 139  K 3.8 4.0 4.0  CL 100 100 104  CO2 31 27 27   GLUCOSE 110* 135* 102*  BUN 21 16 13   CALCIUM 9.7 9.3 8.8*  CREATININE 1.30* 1.04 0.94  GFRNONAA 59* >60 >60    LIVER FUNCTION TESTS: Recent Labs    06/19/22 0843 10/27/22 1002 10/28/22 0204  BILITOT 0.6 0.5 0.5  AST 22 17 14*  ALT 23 17 15   ALKPHOS 68 104 93  PROT 7.5 7.0 6.3*  ALBUMIN 4.4 4.1 3.5    TUMOR MARKERS: No results for input(s): "AFPTM", "CEA", "CA199", "CHROMGRNA" in the last 8760 hours.  Assessment and Plan:  Sanjit Mcmichael is a 70 yo male being seen today in relation to recently discovered right iliac lytic lesion. CT imaging on 12/11 revealed the previously mentioned lytic lesion, which is concerning for metastatic disease. IR was consulted for image-guided biopsy of this lytic lesion. Case was reviewed and approved by Dr Dwaine Gale. The patient was seen at bedside today accompanied by family, and was enthusiastic about having the biopsy  performed as soon as possible.  Risks and benefits of image-guided right iliac lytic lesion biopsy was discussed with the patient and/or patient's family including, but not limited to bleeding, infection, damage to adjacent structures or low yield requiring additional tests.  All of the questions were answered and there is agreement to proceed.  Consent signed and in chart.    Thank you for this interesting consult.  I greatly enjoyed meeting CARLY APPLEGATE and look forward to participating in their care.  A copy of this report was sent to the requesting provider on this date.  Electronically Signed: Lura Em, PA-C 10/29/2022, 9:35 AM   I spent a total of 55 Miinutes    in face to face in clinical consultation, greater than 50% of which was counseling/coordinating care for right iliac lytic lesion.

## 2022-10-29 NOTE — Progress Notes (Signed)
Report given to [facility] and [place] [facility].

## 2022-10-29 NOTE — Progress Notes (Signed)
Report given to Lottie Dawson RN.

## 2022-10-29 NOTE — Consult Note (Addendum)
Consultation Note Date: 10/29/2022   [name]  DOB: 1951-03-31  MRN: [id number]  Age / Sex: 71 y.o., male  PCP: Jacalyn Lefevre [name], MD Referring Physician: Elmarie Shiley, MD  Reason for Consultation: pain management  HPI/Patient Profile: 71 y.o. male  with past medical history of stroke, hypertension, gout, GERD, superior mesenteric artery stenosis admitted on 10/27/2022 with worsening pain.  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  Oncology consulted and in plan for biopsy and staging scans.  He had an MRI of the brain today which was negative for any signs of metastatic disease.  Palliative medicine consulted for assistance with pain management.  Primary Decision Maker PATIENT  Discussion: Chart reviewed including labs, progress notes, imaging from this and previous encounters.   On evaluation patient was awake, alert and oriented.  Noted to be in some discomfort with restlessness. 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  He is currently working from home. He values being able to work and to spend time with his family.  He does note that his pain has interfered some with his ability to work, his ability to  sleep, and his relationship with his family.  His goals are to have his pain controlled so that he is able to continue working and be functional and relieve stress from his family. He has pain in his back on the right side that is in the area of about the ninth posterior rib this is consistent with his mass he also has pain in his right lower hip that is consistent with the iliac lytic lesion. We discussed cancer pain and options for management. He has been using IV Dilaudid which provides relief however the pain quickly returns before his next dose is due.  He has had a total of 14 mg in the last 24 hours. CT scan noted some constipation he has had a bowel movement every day except today.    SUMMARY OF RECOMMENDATIONS -Hydromorphone PCA- continuous infusion 0.5mg  with bolus dosing with eventual goal of transitioning to long acting opioid when pain is well controlled (we discussed possible oxycontin, fentanyl patch, or Hydromorphone ER) -Trazodone 25mg  QHS for sleep -Senna 1 po nightly for prophylaxis of opioid induced constipation, miralax 17gm daily as needed for constipation- if he does not have a BM in the next 24 hours recommend changing senna dose to 2 nightly and starting Miralax daily -He is  transferring to [facility] today- will be seen by PMT provider there - Will place referral for outpatient Palliative clinic at [facility] for continued followup after discharge -He would benefit from low dose steroid, but will defer until decisions are made about his Oncology treatment plan so as not to interfere if immunotherapy is planned- recommend dexamethasone 1mg  po qam- he was previously on prednisone 30mg  daily in the past and had significant side effects with mood swings, hot flashes and he wishes to avoid this     Code Status/Advance Care Planning: Full code   Prognosis:   Unable to determine  Discharge Planning: To Be Determined  Primary Diagnoses: Present on Admission:   Intractable pain  Essential hypertension  Hyperlipidemia  Superior mesenteric artery stenosis (HCC)   Review of Systems  Constitutional:  Positive for activity change and appetite change.  Psychiatric/Behavioral:  Positive for sleep disturbance.     Physical Exam Vitals and nursing note reviewed.  Pulmonary:     Effort: Pulmonary effort is normal.  Neurological:     General: No focal deficit present.     Mental Status: He is alert and oriented to person, place, and time.  Psychiatric:        Mood and Affect: Mood normal.        Behavior: Behavior normal.        Thought Content: Thought content normal.        Judgment: Judgment normal.     Vital Signs: BP (!) 187/75   Pulse 71   Temp 98.6 F (37 C)   Resp 16   Ht 5\' 11"  (1.803 m)   Wt 89.6 kg   SpO2 100%   BMI 27.55 kg/m  Pain Scale: 0-10   Pain Score: Asleep   SpO2: SpO2: 100 % O2 Device:SpO2: 100 % O2 Flow Rate: .   IO: Intake/output summary:  Intake/Output Summary (Last 24 hours) at 10/29/2022 1344 Last data filed at 10/28/2022 2300 Gross per 24 hour  Intake 120 ml  Output --  Net 120 ml    LBM: Last BM Date : 10/27/22 Baseline Weight: Weight: 88.5 kg Most recent weight: Weight: 89.6 kg       Thank you for this consult. Palliative medicine will continue to follow and assist as needed.  Time Total:  90 minutes Greater than 50%  of this time was spent counseling and coordinating care related to the above assessment and plan.  Signed by: Mariana Kaufman, AGNP-C Palliative Medicine    Please contact Palliative Medicine Team phone at [phone] for questions and concerns.  For individual provider: See Shea Evans

## 2022-10-29 NOTE — Progress Notes (Signed)
Radiation Oncology         ([phone]) [phone] ________________________________  Inpatient Consultation Note   Name: Thomas [name]        MRN: [id number]  Date of Service: 10/29/2022 DOB: 1951/02/09  AT:FTDD, [name], MD  Curt Bears, MD     REFERRING PHYSICIAN: Curt Bears, MD   DIAGNOSIS: The encounter diagnosis was Lytic bone lesion of hip.   HISTORY OF PRESENT ILLNESS: Thomas [name] is a 71 y.o. male seen at the request of Dr. Julien Nordmann for discussion of palliative radiation to a painful right iliac bone lesion. Patient was admitted to the hospital with persistent right upper quadrant abdominal pain. He lost around 30 pounds in the last few months. The patient was being evaluated by his primary care physician and was undergoing imaging studies when he presented to the ED on 10/27/22 for abdominal pain. CT scan on 10/27/22 showed showed destructive lytic lesion in the right iliac bone with soft tissue extension measuring up to 5.8 cm new since the previous lumbar spine MRI on Apr 14 2021 highly suspicious for metastatic disease.  There was also soft tissue attenuation of pleural thickening along the posterior right lower lobe and along the medial aspect of the right hemidiaphragm suspicious for pleural metastasis. There was no evidence of colonic neoplasm. CT angiogram of the chest showed no filling defect in the pulmonary artery tree to suggest pulmonary embolus, but did show reduced caliber of the right upper lobe pulmonary artery due to mass effect from the surrounding tumor. There was also a pleural mass along the right lower lobe posteriorly with erosion of the right ninth rib and extension into the right eighth intercostal space compatible with chest wall invasion. There was also a right upper lobe pulmonary nodule 1.8 by 1.2 cm, suspicious for malignancy along with right hilar, right paratracheal, and prevascular adenopathy compatible with malignancy. CT angiogram of the abdomen and  pelvis showed a destructive lytic lesion of the right iliac with associated 5.7 x 4.6 cm soft tissue density concerning for malignancy or metastatic disease. There was also a hypodense 1.5 cm lesion in the right hepatic lobe, likely a cyst.  Dr. Julien Nordmann was consulted on 10/28/22 for evaluation. At that time he continued to have a significant amount of pain while on Dilaudid every 4 hours. He increased Dilaudid dose to 2 mg every 3 hours as needed and consulted palliative care for adjustment of his pain medication. He arranged for biopsy of the right iliac soft tissue mass for confirmation of tissue diagnosis. If not sufficient, he will refer patient to pulmonary medicine for consideration of bronchoscopy and biopsy of the lung nodule as well as the right hilar and mediastinal lymphadenopathy. MRI ordered to rule out brain metastasis. PET scan scheduled on outpatient basis to rule out metastatic disease.    CT angio of the chest abdomen and pelvis on 10/28/22 showed a 1.7 x 1.3 cm medial right upper lobe pleural-based nodule; right hilar and mediastinal adenopathy with a 4.5 x 3.3 cm right paratracheal nodal mass encasing and narrowing the right upper lobe pulmonary artery; right-sided nodular pleural thickening most likely due to pleural metastases with a small layering right pleural effusion possibly malignant, and suspected chest wall invasion through the eighth intercostal space posteriorly; and metastatic erosion of the anterior aspect of the posterior right ninth rib, and a 5.6 x 5.1 cm destructive lesion of the medial right iliac crest.  The patient is married and has 2 children and  son and daughter. He works as a Pharmacist, hospital. He has a history for smoking for around 10 years in his early age and quit in 1995. He drinks alcohol occasionally and no history of drug abuse. Of note, patient does have a history of a right total hip replacement.   Today he reports to be having significant pain in both his right  upper pelvis and along with pain in his right upper-mid back that radiates to his side. These areas are consistent with lesions visualized on CT. He states he started experiencing this pain about 2 months ago but it has progressively gotten worse. He was transferred to [facility] today for treatment planning which he stated was a very painful process for him. He will remain at [facility] for the remainder of his care until stable for discharge.  PREVIOUS RADIATION THERAPY: No   PAST MEDICAL HISTORY:  Past Medical History:  Diagnosis Date   Abdominal wall pain 04/02/2021   Acute ischemic stroke ([place]) 03/22/2020   Avulsed toenail, initial encounter 05/04/2018   Dyspnea on exertion 02/10/2020   ED (erectile dysfunction)    Epidermoid cyst of neck 10/16/2017   Essential hypertension 07/21/2017   GERD (gastroesophageal reflux disease)    Gout of multiple sites 07/21/2017   History of kidney stones    History of squamous cell carcinoma excision    Hyperlipidemia    Hypertension not at goal 01/19/2014   It band syndrome, left 03/19/2020   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/24/2020   Left ureteral calculus    Lower abdominal pain 08/27/2015   Metatarsalgia of both feet 03/19/2020   Multiple joint pain 07/16/2018   Pain in joint, shoulder region 12/11/2015   Right flank pain 04/02/2021   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   Urgency of urination    URI (upper respiratory infection) 11/08/2014   Wears glasses        PAST SURGICAL HISTORY: Past Surgical History:  Procedure Laterality Date   COLONOSCOPY WITH PROPOFOL  2017   CYSTOSCOPY/RETROGRADE/URETEROSCOPY/STONE EXTRACTION WITH BASKET Left 04/16/2018   Procedure: CYSTOSCOPY/RETROGRADE/URETEROSCOPY/STONE EXTRACTION WITH BASKET/ HOLMIUM LASER LITHOTRIPSY/ STENT PLACEMENT;  Surgeon: Ardis Hughs, MD;  Location: [facility];  Service: Urology;  Laterality: Left;   HERNIA REPAIR Right  2022   inguinal   HOLMIUM LASER APPLICATION Left 41/66/0630   Procedure: HOLMIUM LASER APPLICATION;  Surgeon: Ardis Hughs, MD;  Location: [facility];  Service: Urology;  Laterality: Left;   TOTAL HIP ARTHROPLASTY Right 06/30/2022   Procedure: RIGHT TOTAL HIP ARTHROPLASTY ANTERIOR APPROACH;  Surgeon: Leandrew Koyanagi, MD;  Location: [place];  Service: Orthopedics;  Laterality: Right;  3-C     FAMILY HISTORY:  Family History  Problem Relation Age of Onset   Heart disease Mother 94   Alcohol abuse Mother    Depression Mother    Hypertension Father    Sleep apnea Father    Cancer Sister        hodgkins, breast   Colon cancer Neg Hx    Pancreatic cancer Neg Hx    Rectal cancer Neg Hx    Stomach cancer Neg Hx    Esophageal cancer Neg Hx    Inflammatory bowel disease Neg Hx    Liver disease Neg Hx      SOCIAL HISTORY:  reports that he quit smoking about 28 years ago. His smoking use included cigarettes. He has a 6.00 pack-year smoking history. He has never used smokeless  tobacco. He reports that he does not currently use alcohol. He reports that he does not use drugs.   ALLERGIES: Statins   MEDICATIONS:  No current facility-administered medications for this encounter.   No current outpatient medications on file.   Facility-Administered Medications Ordered in Other Encounters  Medication Dose Route Frequency Provider Last Rate Last Admin   acetaminophen (TYLENOL) tablet 650 mg  650 mg Oral Q8H Mahan, Kasie J, NP       albuterol (PROVENTIL) (2.5 MG/3ML) 0.083% nebulizer solution 2.5 mg  2.5 mg Nebulization Q2H PRN Regalado, Belkys A, MD       [START ON 11/09/2022] alirocumab (PRALUENT) 150 mg  150 mg Subcutaneous Q14 Days Regalado, Belkys A, MD       allopurinol (ZYLOPRIM) tablet 300 mg  300 mg Oral Daily Regalado, Belkys A, MD   300 mg at 10/29/22 0826   amLODipine (NORVASC) tablet 5 mg  5 mg Oral Daily Regalado, Belkys A, MD   5 mg at 10/29/22 0827    diphenhydrAMINE (BENADRYL) injection 12.5 mg  12.5 mg Intravenous Q6H PRN Earlie Counts, NP       Or   diphenhydrAMINE (BENADRYL) 12.5 MG/5ML elixir 12.5 mg  12.5 mg Oral Q6H PRN Earlie Counts, NP       docusate sodium (COLACE) capsule 100 mg  100 mg Oral BID Regalado, Belkys A, MD       feeding supplement (ENSURE ENLIVE / ENSURE PLUS) liquid 237 mL  237 mL Oral BID BM Regalado, Belkys A, MD       gabapentin (NEURONTIN) capsule 900 mg  900 mg Oral QHS Regalado, Belkys A, MD   900 mg at 10/28/22 2252   heparin injection 5,000 Units  5,000 Units Subcutaneous Q8H Regalado, Belkys A, MD   5,000 Units at 10/29/22 0500   HYDROmorphone (DILAUDID) 1 mg/mL PCA injection   Intravenous Q4H Earlie Counts, NP   30 mg at 10/29/22 1553   HYDROmorphone (DILAUDID) injection 2 mg  2 mg Intravenous Q1H PRN Earlie Counts, NP   2 mg at 10/29/22 1537   irbesartan (AVAPRO) tablet 300 mg  300 mg Oral Daily Regalado, Belkys A, MD   300 mg at 10/29/22 0827   lidocaine (LIDODERM) 5 % 1 patch  1 patch Transdermal Q24H Earlie Counts, NP       methocarbamol (ROBAXIN) tablet 750 mg  750 mg Oral Q8H PRN Regalado, Belkys A, MD   750 mg at 10/28/22 1236   naloxone (NARCAN) injection 0.4 mg  0.4 mg Intravenous PRN Earlie Counts, NP       And   sodium chloride flush (NS) 0.9 % injection 9 mL  9 mL Intravenous PRN Earlie Counts, NP       ondansetron (ZOFRAN) tablet 4 mg  4 mg Oral Q6H PRN Regalado, Belkys A, MD       Or   ondansetron (ZOFRAN) injection 4 mg  4 mg Intravenous Q6H PRN Regalado, Belkys A, MD   4 mg at 10/28/22 0933   oxyCODONE (Oxy IR/ROXICODONE) immediate release tablet 5 mg  5 mg Oral Q6H PRN Earlie Counts, NP       pantoprazole (PROTONIX) EC tablet 40 mg  40 mg Oral BID Regalado, Belkys A, MD   40 mg at 10/29/22 0826   polyethylene glycol (MIRALAX / GLYCOLAX) packet 17 g  17 g Oral Daily Earlie Counts, NP       senna (SENOKOT) tablet 8.6  mg  1 tablet Oral Daily Regalado, Belkys A, MD       traZODone  (DESYREL) tablet 25 mg  25 mg Oral QHS Earlie Counts, NP         REVIEW OF SYSTEMS:  He denies any shortness of breath, cough, fevers, chills, night sweats, unintended weight changes. Positive for chest pain, negative for leg swelling. Positive for nausea and abdominal pain. Negative for constipation, diarrhea and vomiting. Positive for extreme back and hip pain. A complete review of systems is obtained and is otherwise negative.     PHYSICAL EXAM:  Wt Readings from Last 3 Encounters:  10/28/22 197 lb 8.5 oz (89.6 kg)  09/24/22 204 lb (92.5 kg)  09/10/22 210 lb (95.3 kg)   Temp Readings from Last 3 Encounters:  10/29/22 98.6 F (37 C)  07/01/22 99.2 F (37.3 C) (Oral)  06/19/22 98.2 F (36.8 C) (Oral)   BP Readings from Last 3 Encounters:  10/29/22 (!) 187/75  09/24/22 (!) 172/70  09/10/22 (!) 170/72   Pulse Readings from Last 3 Encounters:  10/29/22 71  08/21/22 70  07/01/22 86    /10  In general this is a well appearing gentleman in no acute distress. He's alert and oriented x4 and appropriate throughout the examination. Cardiopulmonary assessment is negative for acute distress and he exhibits normal effort.     ECOG = 2  0 - Asymptomatic (Fully active, able to carry on all predisease activities without restriction)  1 - Symptomatic but completely ambulatory (Restricted in physically strenuous activity but ambulatory and able to carry out work of a light or sedentary nature. For example, light housework, office work)  2 - Symptomatic, <50% in bed during the day (Ambulatory and capable of all self care but unable to carry out any work activities. Up and about more than 50% of waking hours)  3 - Symptomatic, >50% in bed, but not bedbound (Capable of only limited self-care, confined to bed or chair 50% or more of waking hours)  4 - Bedbound (Completely disabled. Cannot carry on any self-care. Totally confined to bed or chair)  5 - Death   Eustace Pen MM, Creech RH, Tormey  DC, et al. [id number]). "Toxicity and response criteria of the [facility]". [place] Oncol. 5 (6): 649-55    LABORATORY DATA:  Lab Results  Component Value Date   WBC 9.6 10/28/2022   HGB 11.8 (L) 10/28/2022   HCT 36.6 (L) 10/28/2022   MCV 81.5 10/28/2022   PLT 285 10/28/2022   Lab Results  Component Value Date   NA 139 10/29/2022   NA 141 10/29/2022   K 4.1 10/29/2022   K 4.2 10/29/2022   CL 103 10/29/2022   CL 107 10/29/2022   CO2 26 10/29/2022   CO2 25 10/29/2022   Lab Results  Component Value Date   ALT 17 10/29/2022   AST 17 10/29/2022   ALKPHOS 89 10/29/2022   BILITOT 0.5 10/29/2022      RADIOGRAPHY: MR BRAIN WO CONTRAST  Result Date: 10/29/2022 CLINICAL DATA:  Non-small-cell lung cancer, staging EXAM: MRI HEAD WITHOUT CONTRAST TECHNIQUE: Multiplanar, multiecho pulse sequences of the brain and surrounding structures were obtained without intravenous contrast. COMPARISON:  MRI head 03/22/2020 FINDINGS: Brain: No evidence of metastatic disease on unenhanced imaging. Note that intravenous contrast significantly increases sensitivity for metastatic disease. Ventricle size and cerebral volume normal. Negative for acute infarct or hemorrhage. Scattered T2 and FLAIR hyperintensities in the white matter similar  to the prior study and most likely chronic microvascular ischemia. Vascular: Normal arterial flow voids Skull and upper cervical spine: No focal lesion. Sinuses/Orbits: Mild mucosal edema paranasal sinuses. Negative orbit Other: None IMPRESSION: 1. Negative for metastatic disease on unenhanced imaging. 2. Mild chronic microvascular ischemic change in the white matter. Electronically Signed   By: Franchot Gallo M.D.   On: 10/29/2022 11:49   CT Angio Chest/Abd/Pel for Dissection W and/or W/WO  Result Date: 10/28/2022 CLINICAL DATA:  Aortic aneurysm suspected or dissection. EXAM: CT ANGIOGRAPHY CHEST, ABDOMEN AND PELVIS TECHNIQUE: Non-contrast CT of  the chest was initially obtained. Multidetector CT imaging through the chest, abdomen and pelvis was performed using the standard protocol during bolus administration of intravenous contrast. Multiplanar reconstructed images and MIPs were obtained and reviewed to evaluate the vascular anatomy. RADIATION DOSE REDUCTION: This exam was performed according to the departmental dose-optimization program which includes automated exposure control, adjustment of the mA and/or kV according to patient size and/or use of iterative reconstruction technique. CONTRAST:  61mL OMNIPAQUE IOHEXOL 350 MG/ML SOLN COMPARISON:  CTA chest and subsequent abdomen and pelvis CT yesterday at 6:50 p.m., CT abdomen and pelvis with IV contrast 01/14/2021. FINDINGS: CTA CHEST FINDINGS Cardiovascular: The heart is slightly enlarged. There is a small anterior pericardial effusion. There are calcifications in the distal left main, proximal LAD and right coronary arteries. The pulmonary arteries and veins are normal in caliber except for again noted encasement and narrowing of the right upper lobe pulmonary artery by adenopathy. The pulmonary arteries are centrally clear. There is scattered aortic mixed plaque, scattered calcific plaques in the great vessels without stenosis, aneurysm or dissection. Mediastinum/Nodes: Right paratracheal nodal mass measures 4.5 x 3.3 cm on 7:49. A portion of this encases the right upper lobe pulmonary artery with narrowing. A prevascular lymph node right in front of the SVC measures 1.5 x 2.2 cm on 7:51. There is a right hilar lymph node just above the distal right main pulmonary artery measuring 2.4 cm in short axis on 7:61. There is a posterior right hilar node measuring 1.5 cm in short axis on 7:64 and a right mid hilar node of 1 cm in short axis on 7:77. Additional prevascular space node to the right is 2.3 x 1.8 cm on 7:37. No thyroid or axillary mass is seen. The trachea is clear. The thoracic esophagus  unremarkable. Lungs/Pleura: Small layering right pleural effusion possibly malignant. Nodular pleural thickening measuring 3.3 x 1.4 cm is noted posterior to the right hemidiaphragm on 7:117 most likely indicating pleural metastasis. Tumor extension into the chest wall is suspected through the right eighth intercostal space posteriorly and there is a rind of pleural thickening in this area as well (see series 7 axial images 93-101). There is a right upper lobe pleural-based pulmonary nodule anteromedially on 6:22 measuring 1.7 x 1.3 cm with small lobulations most likely a neoplasm and possibly the primary neoplasm. In the anterior basal segment of the right lower lobe there is a pleural-based nodule measuring 8 x 6 mm on 6:43. An additional pleural nodular focus suspected on 7:51 in the posterior right upper lung field. There is patchy ground-glass opacity posteriorly in the right upper lobe which could be due to pneumonitis or lymphangitic carcinomatosis. No other focal infiltrate or further nodules are seen. No left pleural effusion. Musculoskeletal: There is erosion in the anterior aspect of the posterior right ninth rib where the pleural thickening extends through the eighth intercostal space consistent with early metastatic rib  destruction. No other focal bone lesion is seen. There is no pathologic fracture. Review of the MIP images confirms the above findings. CTA ABDOMEN AND PELVIS FINDINGS VASCULAR Aorta: There is heavy mixed plaque. The aortic lumen is narrowed to 1 cm focally in the proximal infrarenal segment, otherwise does not show flow limiting stenosis. In the mid infrarenal aorta there is a small saccular aneurysm along the left wall measuring 9 mm at the base and 5 mm in height. There is no dissection. Celiac: There is 75% focal stenosis 1 cm distal to the vessel origin due to compression by the median arcuate ligament of the diaphragm. The vessel otherwise opacifies well. SMA: No flow-limiting  stenosis, aneurysm or dissection. Renals: Single left renal artery is widely patent. There are 2 right renal arteries, the larger of which supplies the upper to midpole, the smaller of which arises anterior to the larger artery and supplies the inferior pole. Both are widely patent. IMA: No flow-limiting stenosis. Inflow: There is mixed plaque in the common iliac arteries with up to 50% stenosis on the left, no stenosis on the right. The internal iliac arteries show patchy calcific plaques without stenosis. Both external iliac arteries are widely patent. Proximal outflow arteries: Small amount of nonstenosing calcification in the right common femoral artery. No other significant findings of the proximal outflow vessels. Veins: Patent portal vein.  Other veins are unopacified. Review of the MIP images confirms the above findings. NON-VASCULAR Hepatobiliary: The liver is 21 cm length with mild steatosis. There is a 1.2 cm cyst in the right lobe laterally. Subcentimeter hypodensity more inferiorly in the right lobe is too small to characterize but was present on both prior studies. No mass enhancement is seen. Gallbladder is distended, otherwise unremarkable no biliary dilatation. Pancreas: No abnormality. Spleen: No abnormality. Adrenals/Urinary Tract: There is no adrenal mass. There are small bilateral renal cysts and a few nonobstructive caliceal stones in the kidneys. Additional subcentimeter renal hypodensities are too small characterize but no follow-up is recommended. There is no hydronephrosis or obstructing stone. No bladder thickening is seen. Stomach/Bowel: Unremarkable stomach and small bowel, normal appendix. Moderate stool retention ascending colon. Left colonic diverticulosis without evidence of diverticulitis. Lymphatic: No appreciable adenopathy. Reproductive: Mildly enlarged but poorly visualized prostate due to artifact from right hip replacement. Other: Right lower quadrant surgical clips. No free  air, hemorrhage or free fluid. Small umbilical fat hernia. Musculoskeletal: Again noted is a destructive lesion of the medial right iliac crest measuring 5.6 x 5.1 cm on 7:235. There is ankylosis across the SI joints. Degenerative changes lumbar spine. Posterior endplate Schmorl's nodes L2-3. No other visible structure bone lesion. Review of the MIP images confirms the above findings. IMPRESSION: 1. Aortic and coronary artery atherosclerosis 2. Small anterior pericardial effusion.  Mild cardiomegaly. 3. No findings of acute aortic syndrome. 4. 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 5. 1.7 x 1.3 cm medial right upper lobe pleural-based nodule most likely a neoplasm and possibly the primary neoplasm. 6. Posterior segment right upper lobe ground-glass opacities consistent with pneumonitis or lymphangitic carcinomatosis. 7. Right hilar and mediastinal adenopathy with a 4.5 x 3.3 cm right paratracheal nodal mass encasing and narrowing the right upper lobe pulmonary artery. 8. Right-sided nodular pleural thickening most likely due to pleural metastases with a small layering right pleural effusion possibly malignant, and suspected chest wall invasion through the eighth intercostal space  posteriorly. 9. Metastatic erosion of the anterior aspect of the posterior right ninth rib, and a 5.6 x 5.1 cm destructive lesion of the medial right iliac crest. 10. Nonobstructive nephrolithiasis. 11. Constipation and diverticulosis. 12. 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 13. Additional findings described above. Aortic Atherosclerosis (ICD10-I70.0). Electronically Signed   By: Telford Nab M.D.   On: 10/28/2022 06:37   CT Angio Abd/Pel W and/or Wo Contrast  Result Date: 10/27/2022 CLINICAL DATA:  Mesenteric ischemia, chronic EXAM: CTA ABDOMEN AND PELVIS WITH CONTRAST TECHNIQUE:  Multidetector CT imaging of the abdomen and pelvis was performed using the standard protocol during bolus administration of intravenous contrast. Multiplanar reconstructed images and MIPs were obtained and reviewed to evaluate the vascular anatomy. RADIATION DOSE REDUCTION: This exam was performed according to the departmental dose-optimization program which includes automated exposure control, adjustment of the mA and/or kV according to patient size and/or use of iterative reconstruction technique. CONTRAST:  28mL OMNIPAQUE IOHEXOL 350 MG/ML SOLN COMPARISON:  CT abdomen pelvis 10/27/2022 12:05 p.m., CT abdomen pelvis 01/14/2021 FINDINGS: VASCULAR Aorta: Slightly limited evaluation due to timing of contrast. Severe atherosclerotic plaque. Narrowing of the infrarenal abdominal aorta caliber down to 1.1 cm at the level of the kidneys. Otherwise normal aorta without aneurysm, dissection, vasculitis. Celiac: Patent without evidence of aneurysm, dissection, vasculitis or significant stenosis. SMA: Patent without evidence of aneurysm, dissection, vasculitis or significant stenosis. Renals: Both renal arteries are patent without evidence of aneurysm, dissection, vasculitis, fibromuscular dysplasia or significant stenosis. IMA: Patent without evidence of aneurysm, dissection, vasculitis or significant stenosis. Inflow: Patent without evidence of aneurysm, dissection, vasculitis or significant stenosis. Proximal Outflow: Bilateral common femoral and visualized portions of the superficial and profunda femoral arteries are patent without evidence of aneurysm, dissection, vasculitis or significant stenosis. Veins: No portal venous or mesenteric venous gas. The main portal, splenic, superior mesenteric veins are patent. Review of the MIP images confirms the above findings. NON-VASCULAR Lower chest: Please see separately dictated CT angiography chest 10/27/2022. Hepatobiliary: There is a 1.2 cm right hepatic lobe fluid density  lesion that likely represents a hepatic cyst. Subcentimeter hypodensity too small to characterize no gallstones, gallbladder wall thickening, or pericholecystic fluid. No biliary dilatation. Pancreas: No focal lesion. Normal pancreatic contour. No surrounding inflammatory changes. No main pancreatic ductal dilatation. Spleen: Normal in size without focal abnormality. Adrenals/Urinary Tract: No adrenal nodule bilaterally.  Fluid Bilateral kidneys enhance symmetrically. Density lesions within the kidneys likely represent simple renal cysts. Simple renal cysts, in the absence of clinically indicated signs/symptoms, require no independent follow-up. Subcentimeter hypodensities are too small to characterize-no further follow-up indicated. No hydronephrosis. No hydroureter. The urinary bladder is unremarkable. There is no urothelial wall thickening and there are no filling defects in the opacified portions of the bilateral collecting systems or ureters. Stomach/Bowel: Stomach is within normal limits. No evidence of bowel wall thickening or dilatation. Colonic diverticulosis. Appendix appears normal. Lymphatic: No lymphadenopathy. Reproductive: The prostate is enlarged measuring up to 5 cm. Other: Surgical clips noted within the right lower quadrant. No intraperitoneal free fluid. No intraperitoneal free gas. No organized fluid collection. Musculoskeletal: Tiny fat containing umbilical hernia. Destructive lytic lesion of the right iliac with associated 5.7 x 4.6 cm soft tissue density. No acute displaced fracture. Multilevel degenerative changes of the spine. Total right hip arthroplasty. IMPRESSION: VASCULAR 1. Aortic Atherosclerosis (ICD10-I70.0) -severe leading to infrarenal abdominal aortic stenosis with caliber down to 1 cm. NON-VASCULAR 1. Colonic  diverticulosis with no acute diverticulitis. 2. Prostatomegaly. 3. Destructive lytic lesion of the right iliac with associated 5.7 x 4.6 cm soft tissue density. Finding  concerning for malignancy/metastatic disease. 4. Please see separately CT angiography chest 10/27/2022 Electronically Signed   By: Iven Finn M.D.   On: 10/27/2022 20:10   CT Angio Chest PE W and/or Wo Contrast  Result Date: 10/27/2022 CLINICAL DATA:  Worsening right-sided pain, possible pulmonary embolus EXAM: CT ANGIOGRAPHY CHEST WITH CONTRAST TECHNIQUE: Multidetector CT imaging of the chest was performed using the standard protocol during bolus administration of intravenous contrast. Multiplanar CT image reconstructions and MIPs were obtained to evaluate the vascular anatomy. RADIATION DOSE REDUCTION: This exam was performed according to the departmental dose-optimization program which includes automated exposure control, adjustment of the mA and/or kV according to patient size and/or use of iterative reconstruction technique. CONTRAST:  45mL OMNIPAQUE IOHEXOL 350 MG/ML SOLN COMPARISON:  Chest radiograph 03/22/2020 FINDINGS: Cardiovascular: Reduced caliber of the right upper lobe pulmonary artery due to mass effect from surrounding tumor. No definite filling defect is identified in the pulmonary arterial tree to suggest pulmonary embolus. Coronary, aortic arch, and branch vessel atherosclerotic vascular disease. Mild cardiomegaly. Mediastinum/Nodes: Right paratracheal node/mass 3.1 cm in short axis on image 49 series 5. Prevascular node 1.6 cm in short axis, image 48 series 5. Right hilar lymph node 2.5 cm in short axis, image 62 series 5. Additional indistinct prevascular lymph nodes noted on the right. Lungs/Pleura: There is a rind of density along the posterior pleural margin along the right lower lobe, suspicious for pleural tumor, about 1.9 cm in thickness on image 100 series 5. Adjacent erosion of the right ninth rib on image 42 series 10, with suspected tumor extension into the chest wall in the eighth intercostal space. Right upper lobe pulmonary nodule 1.8 by 1.2 cm on image 51 series 6. Right  lower lobe pleural-based nodule along the major fissure, 0.9 by 0.5 cm on image 84 series 6. Nodularity along the pleural margin of the right posterior diaphragm measuring 2.7 by 1.1 cm on image 73 series 10. Airway thickening is present, suggesting bronchitis or reactive airways disease. Calcified granuloma in the superior segment right lower lobe (benign). Mild perihilar ground-glass opacity in the right upper lobe is indistinct and likely inflammatory on image 50 series 6. Upper Abdomen: Hypodense 1.5 cm lesion in the right hepatic lobe on image 111 of series 5, fluid density and most likely to be a cyst. Thoracic spondylosis. Musculoskeletal: As noted above, the pleural mass on the right is associated with a erosion of the top of the right ninth rib and extension into the right eighth intercostal space compatible chest wall invasion. Review of the MIP images confirms the above findings. IMPRESSION: 1. No filling defect in the pulmonary arterial tree to suggest pulmonary embolus. Reduced caliber of the right upper lobe pulmonary artery due to mass effect from the surrounding tumor. 2. Pleural mass along the right lower lobe posteriorly with erosion of the right ninth rib and extension into the right eighth intercostal space compatible with chest wall invasion. 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 3. Right upper lobe pulmonary nodule 1.8 by 1.2 cm, suspicious for malignancy. 4. Right hilar, right paratracheal, and prevascular adenopathy compatible with malignancy. 5. Airway thickening is present, suggesting bronchitis or reactive airways disease. 6. Mild cardiomegaly. Coronary, aortic arch, and branch vessel atherosclerotic vascular disease. 7. Hypodense 1.5 cm lesion in the right hepatic lobe is fluid density  and most likely to be a cyst. 8. Aortic atherosclerosis. 9. Mild perihilar ground-glass opacity in the right upper lobe is indistinct and likely inflammatory. Aortic Atherosclerosis  (ICD10-I70.0). Electronically Signed   By: Van Clines M.D.   On: 10/27/2022 19:57   CT Abdomen Pelvis W Contrast  Result Date: 10/27/2022 CLINICAL DATA:  Abdominal pain EXAM: CT ABDOMEN AND PELVIS WITH CONTRAST TECHNIQUE: Multidetector CT imaging of the abdomen and pelvis was performed using the standard protocol following bolus administration of intravenous contrast. RADIATION DOSE REDUCTION: This exam was performed according to the departmental dose-optimization program which includes automated exposure control, adjustment of the mA and/or kV according to patient size and/or use of iterative reconstruction technique. CONTRAST:  78mL OMNIPAQUE IOHEXOL 350 MG/ML SOLN COMPARISON:  Right upper quadrant ultrasound obtained the same day, hip radiographs 06/30/2022, pelvis radiographs 08/14/2022, lumbar spine MRI 04/14/2021 FINDINGS: Lower chest: There is soft tissue attenuating pleural thickening along the posterior right lower lobe (3-3) measuring up to 1.0 cm in thickness and 5.1 cm transverse. There is additional nodular pleural thickening along the medial aspect of the right hemidiaphragm measuring up to 2.7 cm x 1.0 cm (6-62). The imaged heart is unremarkable. Hepatobiliary: A 1.5 cm hypodense lesion in the right hepatic lobe most likely reflects a benign cyst requiring no specific imaging follow-up. There are no other focal liver lesions. The gallbladder is unremarkable. There is no biliary ductal dilatation. Pancreas: Unremarkable. Spleen: Unremarkable. Adrenals/Urinary Tract: The adrenals are unremarkable. There are multiple hypodense lesions in both kidneys likely reflecting benign cysts requiring no specific imaging follow-up. There are no suspicious renal lesions. There are no stones in either kidney or along the course of either ureter. There is symmetric excretion of contrast into the collecting systems on the delayed images. The bladder is unremarkable. Stomach/Bowel: The stomach is  unremarkable. There is no evidence of bowel obstruction. There is extensive colonic diverticulosis. There is no convincing evidence of acute diverticulitis or primary colonic neoplasm. Appendix is normal. Vascular/Lymphatic: There is extensive mixed plaque in the nonaneurysmal abdominal aorta. 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 The major aortic branch vessels are patent. The main portal and splenic veins are patent. Reproductive: The prostate and seminal vesicles are grossly unremarkable though largely obscured by streak artifact from the right hip arthroplasty hardware. Other: There is no ascites or free air. There is a small fat containing umbilical hernia. Postsurgical changes are noted along the right ventral abdominal wall. Musculoskeletal: There is a destructive lytic lesion in the right iliac bone with soft tissue extension measuring up to 5.8 cm TV x 4.3 cm AP x 4.7 cm cc. This lesion was not present on the lumbar spine MRI from 04/14/2021. There is a prominent Schmorl's node indenting the inferior L2 endplate. There is no other aggressive or destructive osseous lesion. Right hip arthroplasty hardware is noted. IMPRESSION: 1. 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 2. Soft tissue attenuating pleural thickening along the posterior right lower lobe and along the medial aspect of the right hemidiaphragm are suspicious for pleural metastases. Recommend oncologic workup and dedicated CT chest with contrast. 3. Extensive colonic diverticulosis without evidence of acute diverticulitis. No convincing evidence of primary colonic neoplasm. 4. Extensive mixed plaque in the nonaneurysmal abdominal aorta with probable penetrating atherosclerotic ulcer  and  flow-limiting stenosis just inferior to the level of the ulcer. Recommend vascular referral and nonemergent CTA abdomen/pelvis. Electronically Signed   By: Valetta Mole M.D.   On: 10/27/2022 12:40   US Abdomen Limited RUQ (LIVER/GB)  Result Date: 10/27/2022 CLINICAL DATA:  RIGHT upper quadrant pain EXAM: ULTRASOUND ABDOMEN LIMITED RIGHT UPPER QUADRANT COMPARISON:  February 2022 FINDINGS: Gallbladder: No gallstones or wall thickening visualized. No sonographic Murphy sign noted by sonographer. Common bile duct: Diameter: 5.5 mm, without intrahepatic biliary duct distension. Liver: Increased hepatic echogenicity without visible lesion on submitted images. Study limited by body habitus. Portal vein is patent on color Doppler imaging with normal direction of blood flow towards the liver. Other:  no ascites IMPRESSION: Hepatic steatosis without acute findings. Electronically Signed   By: Zetta Bills M.D.   On: 10/27/2022 10:34       IMPRESSION/PLAN: 1. Thomas [name] is a 72 yo male being seen today for his recently discovered right iliac lesion. CT on 12/11 revealed the lesion concerning for metastatic disease. He is experiencing a significant amount of pain in his right hip. He also reports pain in his mid-upper back that radiates to his right side. This is consistent with CT from 10/28/22 findings of metastatic erosion of the anterior aspect of the posterior right ninth rib, and a 5.6 x 5.1 cm destructive lesion of the medial right iliac crest.   He would be a good candidate for short course of palliative radiation therapy directed to both of these areas.  Today we discussed the workup and natural course of metastatic bone lesions, highlighting the role of radiotherapy in the management. Patient is a good candidate for palliative radiotherapy to decrease his symptoms and decrease risk of further disease spread. Accordingly, I recommend 30 Gy in 10 fractions to the right pelvis and right posterior lower  chest region. We discussed the available radiation techniques, and focused on the details and logistics of delivery. We discussed and outlined the risks, benefits, short and long-term effects associated with radiotherapy. He was encouraged to ask questions that were answered to his stated satisfaction. Consent form was signed and CT simulation is planned for today. Will hopefully begin treatment tomorrow.   In a visit lasting 60 minutes, greater than 50% of the time was spent face to face discussing the patient's condition, in preparation for the discussion, and coordinating the patient's care.     Leona Singleton, [place]  Blair Promise, MD, PhD

## 2022-10-29 NOTE — Progress Notes (Signed)
Patient refused Ativan as per order.  Wasted to [employer] as witness.

## 2022-10-29 NOTE — Care Management (Signed)
  Transition of Care [facility]) Screening Note   Patient Details  Name: Thomas [name] Date of Birth: 19-Apr-1951   Transition of Care [facility]) CM/SW Contact:    Carles Collet, RN Phone Number: 10/29/2022, 8:06 AM    Transition of Care Department [facility]) has reviewed patient and is following advancement through interdisciplinary progression rounds. If new patient transition needs arise, please place a TOC consult.  Palliative consult pending

## 2022-10-30 ENCOUNTER — Ambulatory Visit
Admission: RE | Admit: 2022-10-30 | Discharge: 2022-10-30 | Disposition: A | Discharge status: Home / Self Care | Payer: Medicare HMO | Source: Ambulatory Visit | Attending: Radiation Oncology | Admitting: Radiation Oncology

## 2022-10-30 ENCOUNTER — Inpatient Hospital Stay (HOSPITAL_COMMUNITY): Payer: Medicare HMO

## 2022-10-30 ENCOUNTER — Other Ambulatory Visit: Payer: Self-pay

## 2022-10-30 DIAGNOSIS — M898X5 Other specified disorders of bone, thigh: Secondary | ICD-10-CM

## 2022-10-30 LAB — RAD ONC ARIA SESSION SUMMARY

## 2022-10-30 LAB — CBC
HCT: 42.6 % (ref 39.0–52.0)
Hemoglobin: 12.8 g/dL — ABNORMAL LOW (ref 13.0–17.0)
MCH: 25.7 pg — ABNORMAL LOW (ref 26.0–34.0)
MCHC: 30 g/dL (ref 30.0–36.0)
MCV: 85.5 fL (ref 80.0–100.0)
Platelets: 311 10*3/uL (ref 150–400)
RBC: 4.98 MIL/uL (ref 4.22–5.81)
RDW: 15.2 % (ref 11.5–15.5)
WBC: 9.6 10*3/uL (ref 4.0–10.5)
nRBC: 0 % (ref 0.0–0.2)

## 2022-10-30 LAB — PSA (REFLEX TO FREE) (SERIAL): Prostate Specific Ag, Serum: 1.3 ng/mL (ref 0.0–4.0)

## 2022-10-30 MED ORDER — HYDROMORPHONE HCL 2 MG PO TABS: 2.0000 mg | ORAL_TABLET | ORAL | Status: DC | PRN

## 2022-10-30 MED ORDER — HYDROMORPHONE HCL 2 MG PO TABS
2.0000 mg | ORAL_TABLET | Freq: Four times a day (QID) | ORAL | Status: DC
  Administered 2022-10-30 – 2022-10-31 (×4): 2 mg via ORAL
  Filled 2022-10-30 (×4): qty 1

## 2022-10-30 MED ORDER — FENTANYL CITRATE (PF) 100 MCG/2ML IJ SOLN
INTRAMUSCULAR | Status: AC
  Filled 2022-10-30: qty 4

## 2022-10-30 MED ORDER — MIDAZOLAM HCL 2 MG/2ML IJ SOLN
INTRAMUSCULAR | Status: AC
  Filled 2022-10-30: qty 4

## 2022-10-30 MED ORDER — FENTANYL CITRATE (PF) 100 MCG/2ML IJ SOLN
INTRAMUSCULAR | Status: AC | PRN
  Administered 2022-10-30: 25 ug via INTRAVENOUS
  Administered 2022-10-30: 50 ug via INTRAVENOUS
  Administered 2022-10-30: 25 ug via INTRAVENOUS

## 2022-10-30 MED ORDER — MIDAZOLAM HCL 2 MG/2ML IJ SOLN
INTRAMUSCULAR | Status: AC | PRN
  Administered 2022-10-30: 1 mg via INTRAVENOUS
  Administered 2022-10-30 (×2): .5 mg via INTRAVENOUS

## 2022-10-30 MED ORDER — ONDANSETRON 4 MG PO TBDP
4.0000 mg | ORAL_TABLET | Freq: Three times a day (TID) | ORAL | Status: DC
  Administered 2022-10-31 – 2022-11-01 (×3): 4 mg via ORAL
  Filled 2022-10-30 (×6): qty 1

## 2022-10-30 MED ORDER — HYDROMORPHONE HCL 2 MG/ML IJ SOLN
2.0000 mg | INTRAMUSCULAR | Status: DC
  Administered 2022-10-30 – 2022-10-31 (×8): 2 mg via INTRAVENOUS
  Filled 2022-10-30 (×8): qty 1

## 2022-10-30 MED ORDER — FLUMAZENIL 0.5 MG/5ML IV SOLN
INTRAVENOUS | Status: AC
  Filled 2022-10-30: qty 5

## 2022-10-30 MED ORDER — NALOXONE HCL 0.4 MG/ML IJ SOLN
INTRAMUSCULAR | Status: AC
  Filled 2022-10-30: qty 1

## 2022-10-30 NOTE — Care Management Important Message (Signed)
Important Message  Patient Details IM Letter placed in Patient's room. Name: Thomas [name] MRN: [id number] Date of Birth: 05/10/1951   Medicare Important Message Given:  Yes     Kerin Salen 10/30/2022, 10:47 AM

## 2022-10-30 NOTE — Progress Notes (Signed)
Daily Progress Note   [name]       Date: 10/30/2022 DOB: October 28, 1951  Age: 71 y.o. MRN#: [id number] Attending Physician: Antonieta Pert, MD Primary Care Physician: Michael Boston, MD Admit Date: 10/27/2022  Reason for Consultation/Follow-up: Pain control  Subjective: Complains of uncontrolled pain, wife and daughter arrived at bedside. R pelvic bone met core biopsy done earlier in am on 10-30-22.  Complains of nausea and constipation  Length of Stay: 3  Current Medications: Scheduled Meds:   acetaminophen  650 mg Oral Q8H   [START ON 11/09/2022] Alirocumab  150 mg Subcutaneous Q14 Days   allopurinol  300 mg Oral Daily   amLODipine  5 mg Oral Daily   feeding supplement  237 mL Oral BID BM   gabapentin  900 mg Oral QHS   heparin  5,000 Units Subcutaneous Q8H    HYDROmorphone (DILAUDID) injection  2 mg Intravenous Q3H   HYDROmorphone  2 mg Oral Q6H   irbesartan  300 mg Oral Daily   lidocaine  1 patch Transdermal Q24H   ondansetron  4 mg Oral Q8H   pantoprazole  40 mg Oral BID   polyethylene glycol  17 g Oral Daily   senna  1 tablet Oral Daily   traZODone  25 mg Oral QHS    Continuous Infusions:   PRN Meds: albuterol, HYDROmorphone **AND** HYDROmorphone, methocarbamol, ondansetron **OR** ondansetron (ZOFRAN) IV  Physical Exam         Awake alert resting in bed Complains of pain Regular work of breathing No edema  Vital Signs: BP (!) 153/62 (BP Location: Right Arm)   Pulse 70   Temp 98.5 F (36.9 C) (Oral)   Resp 11   Ht _0  (1.803 m)   Wt 89.6 kg   SpO2 100%   BMI 27.55 kg/m  SpO2: SpO2: 100 % O2 Device: O2 Device: Nasal Cannula O2 Flow Rate: O2 Flow Rate (L/min): 2 L/min  Intake/output summary:  Intake/Output Summary (Last 24 hours) at  10/30/2022 1345 Last data filed at 10/30/2022 0022 Gross per 24 hour  Intake 240 ml  Output --  Net 240 ml   LBM: Last BM Date : 10/27/22 Baseline Weight: Weight: 88.5 kg Most recent weight: Weight: 89.6 kg       Palliative Assessment/Data:  Patient Active Problem List   Diagnosis Date Noted   Lytic bone lesion of hip 10/28/2022   Penetrating atherosclerotic ulcer of aorta ([place]) 10/28/2022   Intractable pain 10/27/2022   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   Preoperative cardiovascular examination 06/05/2022   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   Primary osteoarthritis of left hip 03/19/2022   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   Chronic gouty arthritis 04/23/2021   Primary osteoarthritis 04/23/2021   Abdominal wall pain 04/02/2021   Dysphagia 04/02/2021   Right flank pain 04/02/2021   Wears glasses    Left ureteral calculus    Hypertension    Hyperlipidemia    History of squamous cell carcinoma excision    Chronic gout    Right sided abdominal pain 01/14/2021   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/24/2020   Acute ischemic stroke ([place]) 03/22/2020   It band syndrome, left 03/19/2020   Metatarsalgia of both feet 03/19/2020   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 Earlie Server for high suspicion for metastatic neoplasm-likely lung cancer however other malignancy cannot be excluded.  Patient presented with pleural-based metastases in addition to right upper lobe lung nodule and right hilar and mediastinal adenopathy History of stroke with neuropathic pain Lytic bone lesion on hip on the right iliac bone, status post biopsy today. Recommendations/Plan: Patient has uncontrolled pain, patient has  nausea and constipation.  We discussed extensively about symptom management.  Medication history reviewed.  PCA interrogated. Pain: Discontinue PCA IV Dilaudid scheduled for at least the next 24 hours P.o. Dilaudid to be used on an as-needed basis Will likely need long-acting opioids based on the current opioid use Augment bowel regimen Scheduled Zofran for nausea, continue as needed antiemetic regimen PMT to follow.  Goals of Care and Additional Recommendations: Limitations on Scope of Treatment: Full Scope Treatment  Code Status:    Code Status Orders  (From admission, onward)           Start     Ordered   10/27/22 2232  Full code  Continuous        10/27/22 2236           Code Status History     Date Active Date Inactive Code Status Order ID Comments User Context   06/30/2022 1107 07/01/2022 1612 Full Code [id number]  Leandrew Koyanagi, MD Inpatient   03/22/2020 2039 03/23/2020 2037 Full Code [id number]  Modena Jansky, MD Inpatient       Prognosis:  Unable to determine  Discharge Planning: To Be Determined  Care plan was discussed with patient wife and daughter.  Thank you for allowing the Palliative Medicine Team to assist in the care of this patient. High MDM     Greater than 50%  of this time was spent counseling and coordinating care related to the above assessment and plan.  Loistine Chance, MD  Please contact Palliative Medicine Team phone at [phone] for questions and concerns.

## 2022-10-30 NOTE — Sedation Documentation (Signed)
Sample obtained

## 2022-10-30 NOTE — Progress Notes (Cosign Needed)
Wasted 40mL dilaudid PCA syringe with Beulah Gandy, RN.

## 2022-10-30 NOTE — Addendum Note (Signed)
Encounter addended by: Marylene Land, LPN on: 32/99/2426 8:34 AM  Actions taken: Charge Capture section accepted

## 2022-10-30 NOTE — Procedures (Signed)
Interventional Radiology Procedure Note  Procedure: CT RT PELVIC BONE MET CORE BX    Complications: None  Estimated Blood Loss:  MIN  Findings: 31 G CORE X 3    M. Daryll Brod, MD

## 2022-10-30 NOTE — TOC Initial Note (Signed)
Transition of Care [facility]) - Initial/Assessment Note    Patient Details  Name: Thomas [name] MRN: [id number] Date of Birth: 1951-06-10  Transition of Care [facility]) CM/SW Contact:    Angelita Ingles, RN Phone Number:[phone]  10/30/2022, 7:09 PM  Clinical Narrative:                  Transition of Care [facility]) Screening Note   Patient Details  Name: Thomas [name] Date of Birth: 09/03/1951   Transition of Care [facility]) CM/SW Contact:    Angelita Ingles, RN Phone Number: 10/30/2022, 7:09 PM    Transition of Care Department [facility]) has reviewed patient and no TOC needs have been identified at this time. We will continue to monitor patient advancement through interdisciplinary progression rounds. If new patient transition needs arise, please place a TOC consult.          Patient Goals and CMS Choice        Expected Discharge Plan and Services                                                Prior Living Arrangements/Services                       Activities of Daily Living Home Assistive Devices/Equipment: None ADL Screening (condition at time of admission) Patient's cognitive ability adequate to safely complete daily activities?: Yes Is the patient deaf or have difficulty hearing?: No Does the patient have difficulty seeing, even when wearing glasses/contacts?: No Does the patient have difficulty concentrating, remembering, or making decisions?: No Patient able to express need for assistance with ADLs?: Yes Does the patient have difficulty dressing or bathing?: No Independently performs ADLs?: Yes (appropriate for developmental age) Does the patient have difficulty walking or climbing stairs?: No Weakness of Legs: None Weakness of Arms/Hands: None  Permission Sought/Granted                  Emotional Assessment              Admission diagnosis:  Intractable pain [R52] Metastatic malignant neoplasm, unspecified site [facility])  [C79.9] Patient Active Problem List   Diagnosis Date Noted   Lytic bone lesion of hip 10/28/2022   Penetrating atherosclerotic ulcer of aorta ([place]) 10/28/2022   Intractable pain 10/27/2022   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   Preoperative cardiovascular examination 06/05/2022   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   Primary osteoarthritis of left hip 03/19/2022   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   Chronic gouty arthritis 04/23/2021   Primary osteoarthritis 04/23/2021   Abdominal wall pain 04/02/2021   Dysphagia 04/02/2021   Right flank pain 04/02/2021   Wears glasses    Left ureteral calculus    Hypertension    Hyperlipidemia    History of squamous cell carcinoma excision    Chronic gout    Right sided abdominal pain 01/14/2021   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/24/2020   Acute ischemic stroke ([place]) 03/22/2020   It band syndrome, left 03/19/2020   Metatarsalgia of both feet 03/19/2020   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

## 2022-10-30 NOTE — Hospital Course (Addendum)
85yom w/ hx of CVA with residual neuropathic pain, hypertension, GERD, gout, hyperlipidemia, superior mesenteric artery stenosis presented w/ progressive RUQ abdominal pain since 9/23.  He has follow-up with his PCP and was undergoing outpatient evaluation with CT scan pending however he presented to the ED with worsening pain. He report weight loss nonintentional for the last 2 months. He was found to have new destructive lytic lesion in the right iliac bone with concern for metastatic disease, medial right upper lobe pleural-based nodule most likely neoplasm of the lung.  He was also found to have a probably penetrating atherosclerotic ulcer, evaluated by vascular surgery who recommend repeat CTA in 6 months. Seen by Dr. Earlie Server 12/12  advised CT-guided biopsy of the lytic lesion, radiation oncology consultation and MRI of the brain.  Patient was transferred to [facility] 12/14 to help with radiation treatment 12/14: Underwent biopsy of right pelvic bone, started on xrt and pain management.  Pain is now controlled on po dilaudid fentanyl patch.  He feels improved and is being discharged home instructed to follow-up with outpatient radiation oncology.

## 2022-10-30 NOTE — Progress Notes (Signed)
PROGRESS NOTE Thomas [name]  HAL:[id number] DOB: Mar 18, 1951 DOA: 10/27/2022 PCP: Michael Boston, MD   Brief Narrative/Hospital Course: 62yom w/ hx of CVA with residual neuropathic pain, hypertension, GERD, gout, hyperlipidemia, superior mesenteric artery stenosis presented w/ progressive RUQ abdominal pain since 9/23.  He has follow-up with his PCP and was undergoing outpatient evaluation with CT scan pending however he presented to the ED with worsening pain. He report weight loss nonintentional for the last 2 months. He was found to have new destructive lytic lesion in the right iliac bone with concern for metastatic disease, medial right upper lobe pleural-based nodule most likely neoplasm of the lung.  He was also found to have a probably penetrating atherosclerotic ulcer, evaluated by vascular surgery who recommend repeat CTA in 6 months. Seen by Dr. Earlie Server 12/12  advised CT-guided biopsy of the lytic lesion, radiation oncology consultation and MRI of the brain.  Patient was transferred to [facility] 12/14 to help with radiation treatment   Subjective: Seen and examined this morning.  Patient resting comfortably. Pain is controlled on PCA pump. He is waiting for biopsy and XRT today  Assessment and Plan: Principal Problem:   Intractable pain Active Problems:   Essential hypertension   Superior mesenteric artery stenosis (HCC)   Hyperlipidemia   Lytic bone lesion of hip   Penetrating atherosclerotic ulcer of aorta (HCC)  Right paratracheal nodal mass 4.5 X3.3 cm encasing/narrowing right upper lobe pulmonary artery Right iliac lytic lesion Posterior right ninth rib metastatic erosion 1.7X 1.3 cm medial right upper lobe pleural-based nodule Right hilar and basilar lymphadenopathy: Finding concerning for metastatic disease: Oncology IR radiation treatment team on board> got CT right pelvic bone core biopsy today.  He is also up for radiation treatment.He had extensive imaging with CT  angio chest abdomen pelvis > finding concerning for metastatic disease- see report for details. MRI brain negative for metastatic lesion 12/13.  On pain management with lidocaine, IV Dilaudid, Tylenol.  Palliative care consulted as well for pain control.  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 He needs CTA follow up in 6 month.CTA  Severe atherosclerotic plaque. Narrowing of the infrarenal abdominal aorta, caliber down to 1.1 cm at the level of the kidneys.  History of CVA with residual neuropathic pain on gabapentin Hypertension: Poorly controlled, on amlodipine and Avapro. Cont prns GERD on PPI HLD-cont statin Constipation: Multiple laxatives on board, in the setting of opiate use  DVT prophylaxis: heparin injection 5,000 Units Start: 10/27/22 2330 Code Status:   Code Status: Full Code Family Communication: plan of care discussed with patient at bedside. Patient status is: Inpatient because of biopsy and radiation treatment Level of care: Med-Surg   Dispo: The patient is from: home            Anticipated disposition: home >2-3 days  Mobility Assessment (last 72 hours)     Mobility Assessment     Row Name 10/30/22 1100 10/29/22 2000 10/28/22 2024 10/28/22 2000 10/27/22 2200   Does patient have an order for bedrest or is patient medically unstable No - Continue assessment No - Continue assessment No - Continue assessment No - Continue assessment No - Continue assessment   What is the highest level of mobility based on the progressive mobility assessment? Level 6 (Walks independently in room and hall) - Balance while walking in  room without assist - Complete -- Level 6 (Walks independently in room and hall) - Balance while walking in room without assist - Complete Level 6 (Walks independently in room and hall) - Balance while  walking in room without assist - Complete Level 6 (Walks independently in room and hall) - Balance while walking in room without assist - Complete             Objective: Vitals last 24 hrs: Vitals:   10/30/22 1040 10/30/22 1045 10/30/22 1050 10/30/22 1055  BP: (!) 170/83 (!) 191/69 (!) 166/72 (!) 153/62  Pulse: 68 67 74 70  Resp: 16 11 11 11   Temp:      TempSrc:      SpO2: 100% 100% 100% 100%  Weight:      Height:       Weight change:   Physical Examination: General exam: alert awake, older than stated age HEENT:Oral mucosa moist, Ear/Nose WNL grossly Respiratory system: bilaterally clear BS,no use of accessory muscle Cardiovascular system: S1 & S2 +, No JVD. Gastrointestinal system: Abdomen soft,NT,ND, BS+ Nervous System:Alert, awake, moving extremities. Extremities: LE edema neg,distal peripheral pulses palpable.  Skin: No rashes,no icterus. MSK: Normal muscle bulk,tone, power  Medications reviewed:  Scheduled Meds:  acetaminophen  650 mg Oral Q8H   [START ON 11/09/2022] Alirocumab  150 mg Subcutaneous Q14 Days   allopurinol  300 mg Oral Daily   amLODipine  5 mg Oral Daily   feeding supplement  237 mL Oral BID BM   gabapentin  900 mg Oral QHS   heparin  5,000 Units Subcutaneous Q8H    HYDROmorphone (DILAUDID) injection  2 mg Intravenous Q3H   HYDROmorphone  2 mg Oral Q6H   irbesartan  300 mg Oral Daily   lidocaine  1 patch Transdermal Q24H   pantoprazole  40 mg Oral BID   polyethylene glycol  17 g Oral Daily   senna  1 tablet Oral Daily   traZODone  25 mg Oral QHS   Continuous Infusions:    Diet Order             Diet regular Room service appropriate? Yes; Fluid consistency: Thin  Diet effective now                    Nutrition Problem: Increased nutrient needs Etiology: acute illness Signs/Symptoms: estimated needs Interventions: Ensure Enlive (each supplement provides 350kcal and 20 grams of protein), MVI, Snacks   Intake/Output Summary  (Last 24 hours) at 10/30/2022 1301 Last data filed at 10/30/2022 0022 Gross per 24 hour  Intake 240 ml  Output --  Net 240 ml   Net IO Since Admission: 907.26 mL [10/30/22 1301]  Wt Readings from Last 3 Encounters:  10/28/22 89.6 kg  09/24/22 92.5 kg  09/10/22 95.3 kg     Unresulted Labs (From admission, onward)     Start     Ordered   10/28/22 0411  UPEP/UIFE/Light Chains/TP, 24-Hr Ur  Once,   R        10/28/22 0410          Data Reviewed: I have personally reviewed following labs and imaging studies CBC: Recent Labs  Lab 10/27/22 1002 10/28/22 0204 10/30/22 0616  WBC 10.5 9.6 9.6  NEUTROABS 8.7*  --   --   HGB 12.8* 11.8* 12.8*  HCT 41.2 36.6* 42.6  MCV 83.6 81.5 85.5  PLT 306 285 601   Basic Metabolic Panel: Recent Labs  Lab 10/27/22 1002 10/28/22  9030 10/29/22 0944  NA 138 139 141  139  K 4.0 4.0 4.2  4.1  CL 100 104 107  103  CO2 27 27 25  26   GLUCOSE 135* 102* 110*  111*  BUN 16 13 16  16   CREATININE 1.04 0.94 1.00  1.05  CALCIUM 9.3 8.8* 8.8*  8.6*   GFR: Estimated Creatinine Clearance: 70.7 mL/min (by C-G formula based on SCr of 1.05 mg/dL). Liver Function Tests: Recent Labs  Lab 10/27/22 1002 10/28/22 0204 10/29/22 0944  AST 17 14* 17  ALT 17 15 17   ALKPHOS 104 93 89  BILITOT 0.5 0.5 0.5  PROT 7.0 6.3* 6.1*  ALBUMIN 4.1 3.5 3.6   Recent Labs  Lab 10/27/22 1002  LIPASE 34   No results for input(s): "AMMONIA" in the last 168 hours. Coagulation Profile: No results for input(s): "INR", "PROTIME" in the last 168 hours. BNP (last 3 results) No results for input(s): "PROBNP" in the last 8760 hours. HbA1C: No results for input(s): "HGBA1C" in the last 72 hours. CBG: No results for input(s): "GLUCAP" in the last 168 hours. Lipid Profile: No results for input(s): "CHOL", "HDL", "LDLCALC", "TRIG", "CHOLHDL", "LDLDIRECT" in the last 72 hours. Thyroid Function Tests: No results for input(s): "TSH", "T4TOTAL", "FREET4", "T3FREE",  "THYROIDAB" in the last 72 hours. Sepsis Labs: No results for input(s): "PROCALCITON", "LATICACIDVEN" in the last 168 hours.  No results found for this or any previous visit (from the past 240 hour(s)).  Antimicrobials: Anti-infectives (From admission, onward)    None      Culture/Microbiology No results found for: "SDES", "SPECREQUEST", "CULT", "REPTSTATUS"  Other culture-see note  Radiology Studies: CT BIOPSY  Result Date: 11-15-2022 INDICATION: Concern for metastatic lung cancer by imaging. Large right posterior iliac destructive bone lesion metastatic disease EXAM: CT-GUIDED BIOPSY RIGHT ILIAC BONE METASTASIS MEDICATIONS: % LIDOCAINE LOCAL ANESTHESIA/SEDATION: 1.0 mg IV Versed; 50 mcg IV Fentanyl Moderate Sedation Time:  10 minutes The patient was continuously monitored during the procedure by the interventional radiology nurse under my direct supervision. PROCEDURE: The procedure, risks, benefits, and alternatives were explained to the patient. Questions regarding the procedure were encouraged and answered. The patient understands and consents to the procedure. Previous imaging reviewed. Patient positioned prone. Noncontrast localization CT performed. The posterior right iliac destructive bone lesion with a large soft tissue component was localized and marked for a posterior approach. Under sterile conditions and local anesthesia, the 17 gauge 6.8 cm guide was advanced to the lesion. Needle position confirmed with CT. 318 gauge cores obtained through the access. Samples were intact and non fragmented. These were placed in formalin. Needle removed. Postprocedure imaging demonstrates no hemorrhage or hematoma. Patient tolerated the procedure well without complication. Vital sign monitoring by nursing staff during the procedure will continue as patient is in the special procedures unit for post procedure observation. FINDINGS: The images document guide needle placement within the posterior right  iliac bone lesion. Post biopsy images demonstrate no complicating feature. COMPLICATIONS: None immediate. IMPRESSION: Successful CT-guided core biopsy of the right iliac bone metastasis RADIATION DOSE REDUCTION: This exam was performed according to the departmental dose-optimization program which includes automated exposure control, adjustment of the mA and/or kV according to patient size and/or use of iterative reconstruction technique. Electronically Signed   By: Jerilynn Mages.  Shick M.D.   On: Nov 15, 2022 11:24   MR BRAIN WO CONTRAST  Result Date: 10/29/2022 CLINICAL DATA:  Non-small-cell lung cancer, staging EXAM: MRI HEAD WITHOUT CONTRAST TECHNIQUE: Multiplanar, multiecho pulse sequences  of the brain and surrounding structures were obtained without intravenous contrast. COMPARISON:  MRI head 03/22/2020 FINDINGS: Brain: No evidence of metastatic disease on unenhanced imaging. Note that intravenous contrast significantly increases sensitivity for metastatic disease. Ventricle size and cerebral volume normal. Negative for acute infarct or hemorrhage. Scattered T2 and FLAIR hyperintensities in the white matter similar to the prior study and most likely chronic microvascular ischemia. Vascular: Normal arterial flow voids Skull and upper cervical spine: No focal lesion. Sinuses/Orbits: Mild mucosal edema paranasal sinuses. Negative orbit Other: None IMPRESSION: 1. Negative for metastatic disease on unenhanced imaging. 2. Mild chronic microvascular ischemic change in the white matter. Electronically Signed   By: Franchot Gallo M.D.   On: 10/29/2022 11:49     LOS: 3 days   Antonieta Pert, MD [facility]  10/30/2022, 1:01 PM

## 2022-10-31 ENCOUNTER — Other Ambulatory Visit: Payer: Self-pay

## 2022-10-31 ENCOUNTER — Ambulatory Visit
Admission: RE | Admit: 2022-10-31 | Discharge: 2022-10-31 | Disposition: A | Discharge status: Home / Self Care | Payer: Medicare HMO | Source: Ambulatory Visit | Attending: Radiation Oncology | Admitting: Radiation Oncology

## 2022-10-31 LAB — RAD ONC ARIA SESSION SUMMARY

## 2022-10-31 MED ORDER — HYDROMORPHONE HCL 2 MG/ML IJ SOLN
2.0000 mg | INTRAMUSCULAR | Status: DC
  Administered 2022-10-31 – 2022-11-01 (×7): 2 mg via INTRAVENOUS
  Filled 2022-10-31 (×7): qty 1

## 2022-10-31 MED ORDER — HYDROMORPHONE HCL 2 MG PO TABS
2.0000 mg | ORAL_TABLET | ORAL | Status: DC | PRN
  Administered 2022-10-31 – 2022-11-01 (×2): 4 mg via ORAL
  Filled 2022-10-31 (×4): qty 2

## 2022-10-31 MED ORDER — SENNA 8.6 MG PO TABS
2.0000 | ORAL_TABLET | Freq: Two times a day (BID) | ORAL | Status: DC
  Administered 2022-10-31 – 2022-11-02 (×4): 17.2 mg via ORAL
  Filled 2022-10-31 (×4): qty 2

## 2022-10-31 MED ORDER — POLYETHYLENE GLYCOL 3350 17 G PO PACK
17.0000 g | PACK | Freq: Two times a day (BID) | ORAL | Status: DC
  Administered 2022-10-31 – 2022-11-01 (×3): 17 g via ORAL
  Filled 2022-10-31 (×4): qty 1

## 2022-10-31 MED ORDER — HYDROMORPHONE HCL 2 MG PO TABS
2.0000 mg | ORAL_TABLET | ORAL | Status: DC
  Administered 2022-10-31 – 2022-11-01 (×6): 2 mg via ORAL
  Filled 2022-10-31 (×6): qty 1

## 2022-10-31 MED ORDER — HYDROMORPHONE HCL 1 MG/ML IJ SOLN
0.5000 mg | Freq: Three times a day (TID) | INTRAMUSCULAR | Status: DC | PRN
  Administered 2022-10-31 – 2022-11-01 (×2): 0.5 mg via INTRAVENOUS
  Filled 2022-10-31 (×2): qty 0.5

## 2022-10-31 NOTE — Progress Notes (Signed)
Daily Progress Note   [name]       Date: 10/31/2022 DOB: 14-May-1951  Age: 71 y.o. MRN#: [id number] Attending Physician: Antonieta Pert, MD Primary Care Physician: Michael Boston, MD Admit Date: 10/27/2022  Reason for Consultation/Follow-up: Pain control  Subjective: Complains of uncontrolled pain episodically, wife and daughter also at bedside. R pelvic bone met core biopsy done on 10-30-22.  Complains of nausea and ongoing constipation, tolerating radiation, however, radiation experience also causes pain.   Length of Stay: 4  Current Medications: Scheduled Meds:   acetaminophen  650 mg Oral Q8H   [START ON 11/09/2022] Alirocumab  150 mg Subcutaneous Q14 Days   allopurinol  300 mg Oral Daily   amLODipine  5 mg Oral Daily   feeding supplement  237 mL Oral BID BM   gabapentin  900 mg Oral QHS   heparin  5,000 Units Subcutaneous Q8H    HYDROmorphone (DILAUDID) injection  2 mg Intravenous Q3H   HYDROmorphone  2 mg Oral Q4H   irbesartan  300 mg Oral Daily   lidocaine  1 patch Transdermal Q24H   ondansetron  4 mg Oral Q8H   pantoprazole  40 mg Oral BID   polyethylene glycol  17 g Oral BID   senna  2 tablet Oral BID   traZODone  25 mg Oral QHS    Continuous Infusions:   PRN Meds: albuterol, HYDROmorphone **AND** HYDROmorphone, methocarbamol, ondansetron **OR** ondansetron (ZOFRAN) IV  Physical Exam         Awake alert resting in bed Complains of pain Regular work of breathing No edema Abdomen is not distended  Vital Signs: BP (!) 158/79 (BP Location: Left Arm)   Pulse 72   Temp 97.6 F (36.4 C) (Oral)   Resp 14   Ht _0  (1.803 m)   Wt 89.2 kg   SpO2 96%   BMI 27.43 kg/m  SpO2: SpO2: 96 % O2 Device: O2 Device: Room Air O2 Flow Rate: O2 Flow  Rate (L/min): 2 L/min  Intake/output summary: No intake or output data in the 24 hours ending 10/31/22 1200  LBM: Last BM Date : 10/27/22 Baseline Weight: Weight: 88.5 kg Most recent weight: Weight: 89.2 kg       Palliative Assessment/Data:      Patient Active Problem List  Diagnosis Date Noted   Lytic bone lesion of hip 10/28/2022   Penetrating atherosclerotic ulcer of aorta ([place]) 10/28/2022   Intractable pain 10/27/2022   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   Preoperative cardiovascular examination 06/05/2022   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   Primary osteoarthritis of left hip 03/19/2022   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   Chronic gouty arthritis 04/23/2021   Primary osteoarthritis 04/23/2021   Abdominal wall pain 04/02/2021   Dysphagia 04/02/2021   Right flank pain 04/02/2021   Wears glasses    Left ureteral calculus    Hypertension    Hyperlipidemia    History of squamous cell carcinoma excision    Chronic gout    Right sided abdominal pain 01/14/2021   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/24/2020   Acute ischemic stroke ([place]) 03/22/2020   It band syndrome, left 03/19/2020   Metatarsalgia of both feet 03/19/2020   Superior mesenteric artery stenosis ([place]) 02/24/2020   Polyarthralgia 07/16/2018   Essential hypertension 07/21/2017   Gout of multiple sites 07/21/2017    Palliative Care Assessment & Plan   Patient Profile:    Assessment: Uncontrolled pain Recently evaluated by Dr. Earlie Server for high suspicion for metastatic neoplasm-likely lung cancer however other malignancy cannot be excluded.  Patient presented with pleural-based metastases in addition to right upper lobe lung nodule and right hilar and mediastinal adenopathy History of stroke with neuropathic pain Lytic bone lesion on hip on the right iliac bone, status post biopsy today. Recommendations/Plan: Patient has uncontrolled pain in episodic  intervals, patient has nausea and ongoing constipation.  We discussed extensively about symptom management again today and also discussed with Dr Lupita Leash.     Pain:   IV Dilaudid scheduled for at least the next 24 hours P.o. Dilaudid 2-4 mg to be used on an as-needed basis Will likely need long-acting opioids based on the current opioid use: we talked about starting trans dermal fentanyl patch from 11-01-22, patient had nausea with oxycodone and does not believe he can tolerate long acting PO medication.  Augment bowel regimen: change Senokot and Miralax to BID, and consider dulcolax suppository or enema is constipation persists.  Scheduled Zofran for nausea, continue as needed antiemetic regimen PMT to follow.  Goals of Care and Additional Recommendations: Limitations on Scope of Treatment: Full Scope Treatment  Code Status:    Code Status Orders  (From admission, onward)           Start     Ordered   10/27/22 2232  Full code  Continuous        10/27/22 2236           Code Status History     Date Active Date Inactive Code Status Order ID Comments User Context   06/30/2022 1107 07/01/2022 1612 Full Code [id number]  Leandrew Koyanagi, MD Inpatient   03/22/2020 2039 03/23/2020 2037 Full Code [id number]  Modena Jansky, MD Inpatient       Prognosis:  Unable to determine  Discharge Planning: To Be Determined  Care plan was discussed with patient wife and daughter.  Thank you for allowing the Palliative Medicine Team to assist in the care of this patient. High MDM     Greater than 50%  of this time was spent counseling and coordinating care related to the above assessment and plan.  Loistine Chance, MD  Please contact Palliative Medicine Team phone at [phone] for questions and concerns.

## 2022-10-31 NOTE — Progress Notes (Signed)
PROGRESS NOTE Thomas [name]  FBP:[id number] DOB: [birth year] DOA: 10/27/2022 PCP: Michael Boston, MD   Brief Narrative/Hospital Course: 15yom w/ hx of CVA with residual neuropathic pain, hypertension, GERD, gout, hyperlipidemia, superior mesenteric artery stenosis presented w/ progressive RUQ abdominal pain since 9/23.  He has follow-up with his PCP and was undergoing outpatient evaluation with CT scan pending however he presented to the ED with worsening pain. He report weight loss nonintentional for the last 2 months. He was found to have new destructive lytic lesion in the right iliac bone with concern for metastatic disease, medial right upper lobe pleural-based nodule most likely neoplasm of the lung.  He was also found to have a probably penetrating atherosclerotic ulcer, evaluated by vascular surgery who recommend repeat CTA in 6 months. Seen by Dr. Earlie Server 12/12  advised CT-guided biopsy of the lytic lesion, radiation oncology consultation and MRI of the brain.  Patient was transferred to [facility] 12/14 to help with radiation treatment 12/14: Underwent biopsy of right pelvic bone    Subjective: Seen and examined this morning family at the bedside Reports he did not get his pain medication 1 time yesterday and somewhat upset about it I requested him to let nurse know to reach out to me or Dr Anwar/On call MD if he has an issue with the pain management Overnight afebrile, BP 824M to 353I systolic. Off PCA pump yesterday and changed to scheduled IV Dilaudid Requesting to change his Dilaudid for 2 PM before radiation therapy today He feels that radiation is working  Assessment and Plan: Principal Problem:   Intractable pain Active Problems:   Essential hypertension   Superior mesenteric artery stenosis (HCC)   Hyperlipidemia   Lytic bone lesion of hip   Penetrating atherosclerotic ulcer of aorta (HCC)  Right paratracheal nodal mass 4.5 X3.3 cm encasing/narrowing right upper lobe  pulmonary artery Right iliac lytic lesion Posterior right ninth rib metastatic erosion 1.7X 1.3 cm medial right upper lobe pleural-based nodule Right hilar and basilar lymphadenopathy Cancer related pain:  Finding concerning for metastatic disease: Oncology IR radiation treatment team on board> s/p CT right pelvic bone core biopsy 10/30/22 and plan for radiation treatment.He had extensive imaging with CT angio chest abdomen pelvis > finding concerning for metastatic disease- see report for details. MRI brain negative for metastatic lesion 12/13.  Palliative care following closely for pain management, Dilaudid PCA discontinued 12/14, on Dilaudid p.o. and IV being adjusted per palliative care, on his scheduled Tylenol.  Timing of the pain medication changed per his request.  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 He needs CTA follow up in 6 month.CTA  Severe atherosclerotic plaque. Narrowing of the infrarenal abdominal aorta, caliber down to 1.1 cm at the level of the kidneys.  History of CVA with residual neuropathic pain on gabapentin Hypertension: Borderline controlled, continue on amlodipine and Avapro. Cont prns> adjust as PRN GERD continue PPI HLD-cont statin Constipation: Multiple laxatives on board> changed to MiraLAX twice daily, senonot 2 tablet twice daily, constipation in the setting of opiate use.  Monitor and adjust laxatives.   DVT prophylaxis: heparin injection 5,000 Units Start: 10/27/22 2330 Code Status:   Code Status: Full Code Family Communication: plan of care discussed with patient at bedside. Patient status is: Inpatient because of radiation treatment Level of care: Med-Surg   Dispo: The patient  is from: home            Anticipated disposition: home likely over the weekend if pain control is  obtained  Objective: Vitals last 24 hrs: Vitals:   10/30/22 1050 10/30/22 1055 10/30/22 1359 10/31/22 0451  BP: (!) 166/72 (!) 153/62 (!) 160/66 (!) 158/79  Pulse: 74 70 74 72  Resp: 11 11 (!) 22 14  Temp:   98.3 F (36.8 C) 97.6 F (36.4 C)  TempSrc:   Oral Oral  SpO2: 100% 100% 95% 96%  Weight:    89.2 kg  Height:       Weight change:   Physical Examination: General exam: AAox3, ambulatory HEENT:Oral mucosa moist, Ear/Nose WNL grossly, dentition normal. Respiratory system: bilaterally clear BS,no use of accessory muscle Cardiovascular system: S1 & S2 +, regular rate. Gastrointestinal system: Abdomen soft,NT,ND,BS+ Nervous System:Alert, awake, moving extremities and grossly nonfocal Extremities: LE ankle edema neg, lower extremities warm Skin: No rashes,no icterus. MSK: Normal muscle bulk,tone, power   Medications reviewed:  Scheduled Meds:  acetaminophen  650 mg Oral Q8H   [START ON 11/09/2022] Alirocumab  150 mg Subcutaneous Q14 Days   allopurinol  300 mg Oral Daily   amLODipine  5 mg Oral Daily   feeding supplement  237 mL Oral BID BM   gabapentin  900 mg Oral QHS   heparin  5,000 Units Subcutaneous Q8H    HYDROmorphone (DILAUDID) injection  2 mg Intravenous Q3H   HYDROmorphone  2 mg Oral Q6H   irbesartan  300 mg Oral Daily   lidocaine  1 patch Transdermal Q24H   ondansetron  4 mg Oral Q8H   pantoprazole  40 mg Oral BID   polyethylene glycol  17 g Oral Daily   senna  1 tablet Oral Daily   traZODone  25 mg Oral QHS   Continuous Infusions:    Diet Order             Diet regular Room service appropriate? Yes; Fluid consistency: Thin  Diet effective now                    Nutrition Problem: Increased nutrient needs Etiology: acute illness Signs/Symptoms: estimated needs Interventions: Ensure Enlive (each supplement provides 350kcal and 20 grams of protein), MVI, Snacks  No intake or output data in the 24 hours ending 10/31/22 0813  Net IO Since  Admission: 907.26 mL [10/31/22 0813]  Wt Readings from Last 3 Encounters:  10/31/22 89.2 kg  09/24/22 92.5 kg  09/10/22 95.3 kg     Unresulted Labs (From admission, onward)     Start     Ordered   10/28/22 0411  UPEP/UIFE/Light Chains/TP, 24-Hr Ur  Once,   R        10/28/22 0410          Data Reviewed: I have personally reviewed following labs and imaging studies CBC: Recent Labs  Lab 10/27/22 1002 10/28/22 0204 10/30/22 0616  WBC 10.5 9.6 9.6  NEUTROABS 8.7*  --   --   HGB 12.8* 11.8* 12.8*  HCT 41.2 36.6* 42.6  MCV 83.6 81.5 85.5  PLT 306 285 035   Basic Metabolic Panel: Recent Labs  Lab 10/27/22 1002 10/28/22 0204 10/29/22 0944  NA 138 139 141  139  K 4.0 4.0 4.2  4.1  CL 100 104 107  103  CO2 27 27 25  26   GLUCOSE 135* 102* 110*  111*  BUN 16 13 16  16   CREATININE  1.04 0.94 1.00  1.05  CALCIUM 9.3 8.8* 8.8*  8.6*   GFR: Estimated Creatinine Clearance: 70.7 mL/min (by C-G formula based on SCr of 1.05 mg/dL). Liver Function Tests: Recent Labs  Lab 10/27/22 1002 10/28/22 0204 10/29/22 0944  AST 17 14* 17  ALT 17 15 17   ALKPHOS 104 93 89  BILITOT 0.5 0.5 0.5  PROT 7.0 6.3* 6.1*  ALBUMIN 4.1 3.5 3.6   Culture/Microbiology No results found for: "SDES", "SPECREQUEST", "CULT", "REPTSTATUS"  Other culture-see note  Radiology Studies: CT BIOPSY  Result Date: 11/06/2022 INDICATION: Concern for metastatic lung cancer by imaging. Large right posterior iliac destructive bone lesion metastatic disease EXAM: CT-GUIDED BIOPSY RIGHT ILIAC BONE METASTASIS MEDICATIONS: % LIDOCAINE LOCAL ANESTHESIA/SEDATION: 1.0 mg IV Versed; 50 mcg IV Fentanyl Moderate Sedation Time:  10 minutes The patient was continuously monitored during the procedure by the interventional radiology nurse under my direct supervision. PROCEDURE: The procedure, risks, benefits, and alternatives were explained to the patient. Questions regarding the procedure were encouraged and answered.  The patient understands and consents to the procedure. Previous imaging reviewed. Patient positioned prone. Noncontrast localization CT performed. The posterior right iliac destructive bone lesion with a large soft tissue component was localized and marked for a posterior approach. Under sterile conditions and local anesthesia, the 17 gauge 6.8 cm guide was advanced to the lesion. Needle position confirmed with CT. 318 gauge cores obtained through the access. Samples were intact and non fragmented. These were placed in formalin. Needle removed. Postprocedure imaging demonstrates no hemorrhage or hematoma. Patient tolerated the procedure well without complication. Vital sign monitoring by nursing staff during the procedure will continue as patient is in the special procedures unit for post procedure observation. FINDINGS: The images document guide needle placement within the posterior right iliac bone lesion. Post biopsy images demonstrate no complicating feature. COMPLICATIONS: None immediate. IMPRESSION: Successful CT-guided core biopsy of the right iliac bone metastasis RADIATION DOSE REDUCTION: This exam was performed according to the departmental dose-optimization program which includes automated exposure control, adjustment of the mA and/or kV according to patient size and/or use of iterative reconstruction technique. Electronically Signed   By: Jerilynn Mages.  Shick M.D.   On: 11/06/2022 11:24   MR BRAIN WO CONTRAST  Result Date: 10/29/2022 CLINICAL DATA:  Non-small-cell lung cancer, staging EXAM: MRI HEAD WITHOUT CONTRAST TECHNIQUE: Multiplanar, multiecho pulse sequences of the brain and surrounding structures were obtained without intravenous contrast. COMPARISON:  MRI head 03/22/2020 FINDINGS: Brain: No evidence of metastatic disease on unenhanced imaging. Note that intravenous contrast significantly increases sensitivity for metastatic disease. Ventricle size and cerebral volume normal. Negative for acute infarct  or hemorrhage. Scattered T2 and FLAIR hyperintensities in the white matter similar to the prior study and most likely chronic microvascular ischemia. Vascular: Normal arterial flow voids Skull and upper cervical spine: No focal lesion. Sinuses/Orbits: Mild mucosal edema paranasal sinuses. Negative orbit Other: None IMPRESSION: 1. Negative for metastatic disease on unenhanced imaging. 2. Mild chronic microvascular ischemic change in the white matter. Electronically Signed   By: Franchot Gallo M.D.   On: 10/29/2022 11:49     LOS: 4 days   Antonieta Pert, MD [facility]  10/31/2022, 8:13 AM

## 2022-11-01 DIAGNOSIS — R52 Pain, unspecified: Secondary | ICD-10-CM | POA: Diagnosis not present

## 2022-11-01 MED ORDER — ONDANSETRON HCL 4 MG/2ML IJ SOLN: 4.0000 mg | INTRAMUSCULAR | Status: DC | PRN

## 2022-11-01 MED ORDER — BISACODYL 10 MG RE SUPP
10.0000 mg | Freq: Once | RECTAL | Status: AC
  Administered 2022-11-01: 10 mg via RECTAL
  Filled 2022-11-01: qty 1

## 2022-11-01 MED ORDER — ONDANSETRON HCL 4 MG PO TABS: 4.0000 mg | ORAL_TABLET | ORAL | Status: DC | PRN

## 2022-11-01 MED ORDER — HYDROMORPHONE HCL 2 MG PO TABS
2.0000 mg | ORAL_TABLET | ORAL | Status: DC
  Administered 2022-11-01 – 2022-11-02 (×7): 2 mg via ORAL
  Filled 2022-11-01 (×7): qty 1

## 2022-11-01 MED ORDER — HYDROMORPHONE HCL 2 MG PO TABS
2.0000 mg | ORAL_TABLET | ORAL | Status: DC | PRN
  Administered 2022-11-01 – 2022-11-02 (×2): 4 mg via ORAL
  Administered 2022-11-02: 2 mg via ORAL
  Filled 2022-11-01 (×3): qty 2

## 2022-11-01 MED ORDER — HYDROMORPHONE HCL 2 MG/ML IJ SOLN
2.0000 mg | Freq: Four times a day (QID) | INTRAMUSCULAR | Status: DC
  Administered 2022-11-01 – 2022-11-02 (×4): 2 mg via INTRAVENOUS
  Filled 2022-11-01 (×4): qty 1

## 2022-11-01 MED ORDER — FENTANYL 25 MCG/HR TD PT72
1.0000 | MEDICATED_PATCH | TRANSDERMAL | Status: DC
  Administered 2022-11-01: 1 via TRANSDERMAL
  Filled 2022-11-01: qty 1

## 2022-11-01 MED ORDER — ONDANSETRON 4 MG PO TBDP
4.0000 mg | ORAL_TABLET | ORAL | Status: DC | PRN
  Administered 2022-11-01: 4 mg via ORAL
  Filled 2022-11-01 (×2): qty 1

## 2022-11-01 MED ORDER — LIDOCAINE 5 % EX PTCH
2.0000 | MEDICATED_PATCH | CUTANEOUS | Status: DC
  Administered 2022-11-01 – 2022-11-02 (×2): 2 via TRANSDERMAL
  Filled 2022-11-01 (×2): qty 2

## 2022-11-01 NOTE — Progress Notes (Signed)
PROGRESS NOTE Thomas [name]  UMP:[id number] DOB: Feb 20, 1951 DOA: 10/27/2022 PCP: Michael Boston, MD   Brief Narrative/Hospital Course: 76yom w/ hx of CVA with residual neuropathic pain, hypertension, GERD, gout, hyperlipidemia, superior mesenteric artery stenosis presented w/ progressive RUQ abdominal pain since 9/23.  He has follow-up with his PCP and was undergoing outpatient evaluation with CT scan pending however he presented to the ED with worsening pain. He report weight loss nonintentional for the last 2 months. He was found to have new destructive lytic lesion in the right iliac bone with concern for metastatic disease, medial right upper lobe pleural-based nodule most likely neoplasm of the lung.  He was also found to have a probably penetrating atherosclerotic ulcer, evaluated by vascular surgery who recommend repeat CTA in 6 months. Seen by Dr. Earlie Server 12/12  advised CT-guided biopsy of the lytic lesion, radiation oncology consultation and MRI of the brain.  Patient was transferred to [facility] 12/14 to help with radiation treatment 12/14: Underwent biopsy of right pelvic bone    Subjective: Seen and examined this morning.  Reports pain has been worse after radiation  Has been needing pain medication around-the-clock during night  Also complains of constipation  Assessment and Plan: Principal Problem:   Intractable pain Active Problems:   Essential hypertension   Superior mesenteric artery stenosis (HCC)   Hyperlipidemia   Lytic bone lesion of hip   Penetrating atherosclerotic ulcer of aorta (HCC)  Right paratracheal nodal mass 4.5 X3.3 cm encasing/narrowing right upper lobe pulmonary artery Right iliac lytic lesion Posterior right ninth rib metastatic erosion 1.7X 1.3 cm medial right upper lobe pleural-based nodule Right hilar and basilar lymphadenopathy Cancer related pain:  Finding concerning for metastatic disease: Oncology IR radiation treatment team on board> s/p CT  right pelvic bone core biopsy 10/30/22 and started on radiation treatment.He had extensive imaging with CT angio chest abdomen pelvis > finding concerning for metastatic disease- see report for details. MRI brain negative for metastatic lesion 12/13.  Palliative care following closely for pain management>Dilaudid PCA discontinued 12/14>on Dilaudid p.o. and IV being adjusted per palliative care> on scheduled Tylenol> starting fentanyl patch 25 mcg, he had nausea with oxycodone, continue PRNs as ordered.  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 He needs CTA follow up in 6 month.CTA  Severe atherosclerotic plaque. Narrowing of the infrarenal abdominal aorta, caliber down to 1.1 cm at the level of the kidneys.  History of CVA with residual neuropathic pain on gabapentin Hypertension: Controlled. Cont amlodipine and Avapro. Cont prns GERD continue PPI HLD-cont statin Constipation: Multiple laxatives on board> cont MiraLAX/stool softeners, added Dulcolax suppository x 1 this morning  DVT prophylaxis: heparin injection 5,000 Units Start: 10/27/22 2330 Code Status:   Code Status: Full Code Family Communication: plan of care discussed with patient at bedside. Patient status is: Inpatient because of radiation treatment Level of care: Med-Surg   Dispo: The patient is from: home            Anticipated disposition: home likely tomorrow if pain is controlled   Objective: Vitals last 24 hrs: Vitals:   10/31/22 1941 11/01/22 0454 11/01/22 0500 11/01/22 0837  BP: (!) 180/73 (!) 143/67  (!) 147/67  Pulse: 74 66  76  Resp: 16 18  16   Temp: 97.9 F (36.6  C) 97.8 F (36.6 C)  (!) 97.4 F (36.3 C)  TempSrc: Oral Oral  Oral  SpO2: 97% 95%  96%  Weight:   87.6 kg   Height:       Weight  change: -1.6 kg  Physical Examination: General exam: AAox3, weak,older appearing HEENT:Oral mucosa moist, Ear/Nose WNL grossly, dentition normal. Respiratory system:Bilaterally clear BS,no use of accessory muscle Cardiovascular system:S1 & S2 +, regular rate. Gastrointestinal system:Abdomen soft,NT,ND,BS+ Nervous System:Alert, awake, moving extremities and grossly nonfocal Extremities:LE ankle edema neg, lower extremities warm Skin:No rashes,no icterus. JGG:EZMOQH muscle bulk,tone, power    Medications reviewed:  Scheduled Meds:  acetaminophen  650 mg Oral Q8H   [START ON 11/09/2022] Alirocumab  150 mg Subcutaneous Q14 Days   allopurinol  300 mg Oral Daily   amLODipine  5 mg Oral Daily   feeding supplement  237 mL Oral BID BM   fentaNYL  1 patch Transdermal Q72H   gabapentin  900 mg Oral QHS   heparin  5,000 Units Subcutaneous Q8H    HYDROmorphone (DILAUDID) injection  2 mg Intravenous Q6H   HYDROmorphone  2 mg Oral Q4H   irbesartan  300 mg Oral Daily   lidocaine  2 patch Transdermal Q24H   pantoprazole  40 mg Oral BID   polyethylene glycol  17 g Oral BID   senna  2 tablet Oral BID   traZODone  25 mg Oral QHS   Continuous Infusions:    Diet Order             Diet regular Room service appropriate? Yes; Fluid consistency: Thin  Diet effective now                    Nutrition Problem: Increased nutrient needs Etiology: acute illness Signs/Symptoms: estimated needs Interventions: Ensure Enlive (each supplement provides 350kcal and 20 grams of protein), MVI, Snacks   Intake/Output Summary (Last 24 hours) at 11/01/2022 1400 Last data filed at 11/01/2022 0900 Gross per 24 hour  Intake 480 ml  Output --  Net 480 ml    Net IO Since Admission: 1,867.26 mL [11/01/22 1400]  Wt Readings from Last 3 Encounters:  11/01/22 87.6 kg  09/24/22 92.5 kg  09/10/22 95.3 kg     Unresulted Labs (From admission, onward)     Start     Ordered   10/28/22 0411   UPEP/UIFE/Light Chains/TP, 24-Hr Ur  Once,   R        10/28/22 0410          Data Reviewed: I have personally reviewed following labs and imaging studies CBC: Recent Labs  Lab 10/27/22 1002 10/28/22 0204 10/30/22 0616  WBC 10.5 9.6 9.6  NEUTROABS 8.7*  --   --   HGB 12.8* 11.8* 12.8*  HCT 41.2 36.6* 42.6  MCV 83.6 81.5 85.5  PLT 306 285 476    Basic Metabolic Panel: Recent Labs  Lab 10/27/22 1002 10/28/22 0204 10/29/22 0944  NA 138 139 141  139  K 4.0 4.0 4.2  4.1  CL 100 104 107  103  CO2 27 27 25  26   GLUCOSE 135* 102* 110*  111*  BUN 16 13 16  16   CREATININE 1.04 0.94 1.00  1.05  CALCIUM 9.3 8.8* 8.8*  8.6*    GFR: Estimated Creatinine Clearance: 70.7 mL/min (by C-G formula based on SCr of 1.05 mg/dL). Liver Function Tests: Recent Labs  Lab 10/27/22 1002 10/28/22 0204 10/29/22 0944  AST 17 14* 17  ALT 17 15 17   ALKPHOS 104 93 89  BILITOT 0.5 0.5 0.5  PROT 7.0 6.3* 6.1*  ALBUMIN 4.1 3.5 3.6    Culture/Microbiology No results found for: "SDES", "SPECREQUEST", "CULT", "REPTSTATUS"  Other culture-see note  Radiology Studies: No results found.   LOS: 5 days   Antonieta Pert, MD [facility]  11/01/2022, 2:00 PM

## 2022-11-01 NOTE — Progress Notes (Signed)
Daily Progress Note   [name]       Date: 11/01/2022 DOB: 1951/10/16  Age: 71 y.o. MRN#: [id number] Attending Physician: Antonieta Pert, MD Primary Care Physician: Michael Boston, MD Admit Date: 10/27/2022  Reason for Consultation/Follow-up: Pain control  Subjective: Discussed with patient and wife about pain regimen and bowel regime, see below.  Length of Stay: 5  Current Medications: Scheduled Meds:   acetaminophen  650 mg Oral Q8H   [START ON 11/09/2022] Alirocumab  150 mg Subcutaneous Q14 Days   allopurinol  300 mg Oral Daily   amLODipine  5 mg Oral Daily   feeding supplement  237 mL Oral BID BM   fentaNYL  1 patch Transdermal Q72H   gabapentin  900 mg Oral QHS   heparin  5,000 Units Subcutaneous Q8H    HYDROmorphone (DILAUDID) injection  2 mg Intravenous Q6H   HYDROmorphone  2 mg Oral Q4H   irbesartan  300 mg Oral Daily   lidocaine  2 patch Transdermal Q24H   pantoprazole  40 mg Oral BID   polyethylene glycol  17 g Oral BID   senna  2 tablet Oral BID   traZODone  25 mg Oral QHS    Continuous Infusions:   PRN Meds: albuterol, HYDROmorphone **AND** HYDROmorphone, methocarbamol, ondansetron **OR** ondansetron (ZOFRAN) IV  Physical Exam         Awake alert resting in bed Complains of pain: L sided and also generalized Regular work of breathing No edema Abdomen is not distended  Vital Signs: BP (!) 147/67 (BP Location: Right Arm)   Pulse 76   Temp (!) 97.4 F (36.3 C) (Oral)   Resp 16   Ht 5\' 11"  (1.803 m)   Wt 87.6 kg   SpO2 96%   BMI 26.94 kg/m  SpO2: SpO2: 96 % O2 Device: O2 Device: Room Air O2 Flow Rate: O2 Flow Rate (L/min): 2 L/min  Intake/output summary:  Intake/Output Summary (Last 24 hours) at 11/01/2022 1049 Last data filed at  11/01/2022 0900 Gross per 24 hour  Intake 720 ml  Output --  Net 720 ml    LBM: Last BM Date : 10/27/22 Baseline Weight: Weight: 88.5 kg Most recent weight: Weight: 87.6 kg       Palliative Assessment/Data:      Patient  Active Problem List   Diagnosis Date Noted   Lytic bone lesion of hip 10/28/2022   Penetrating atherosclerotic ulcer of aorta ([place]) 10/28/2022   Intractable pain 10/27/2022   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   Preoperative cardiovascular examination 06/05/2022   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   Primary osteoarthritis of left hip 03/19/2022   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   Chronic gouty arthritis 04/23/2021   Primary osteoarthritis 04/23/2021   Abdominal wall pain 04/02/2021   Dysphagia 04/02/2021   Right flank pain 04/02/2021   Wears glasses    Left ureteral calculus    Hypertension    Hyperlipidemia    History of squamous cell carcinoma excision    Chronic gout    Right sided abdominal pain 01/14/2021   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/24/2020   Acute ischemic stroke ([place]) 03/22/2020   It band syndrome, left 03/19/2020   Metatarsalgia of both feet 03/19/2020   Superior mesenteric artery stenosis ([place]) 02/24/2020   Polyarthralgia 07/16/2018   Essential hypertension 07/21/2017   Gout of multiple sites 07/21/2017    Palliative Care Assessment & Plan   Patient Profile:    Assessment: Uncontrolled pain Recently evaluated by Dr. Earlie Server for high suspicion for metastatic neoplasm-likely lung cancer however other malignancy cannot be excluded.  Patient presented with pleural-based metastases in addition to right upper lobe lung nodule and right hilar and mediastinal adenopathy History of stroke with neuropathic pain Lytic bone lesion on hip on the right iliac bone, status post biopsy results pending.   Recommendations/Plan: Pain management, nausea and constipation management options  discussed today:    IV Dilaudid 2 mg IV Q 6 hours   P.o. Dilaudid 2-4 mg to be used on an as-needed basis Start 25 mcg trans dermal fentanyl patch from 11-01-22, patient had nausea with oxycodone and does not believe he can tolerate long acting PO medication.  Augment bowel regimen: change Senokot and Miralax to BID, and start dulcolax suppository today   continue as needed antiemetic regimen TOC consult to discuss disposition Anticipate d/c home with home health and outpatient palliative on 11-02-22.  PMT to follow.  Goals of Care and Additional Recommendations: Limitations on Scope of Treatment: Full Scope Treatment  Code Status:    Code Status Orders  (From admission, onward)           Start     Ordered   10/27/22 2232  Full code  Continuous        10/27/22 2236           Code Status History     Date Active Date Inactive Code Status Order ID Comments User Context   06/30/2022 1107 07/01/2022 1612 Full Code [id number]  Leandrew Koyanagi, MD Inpatient   03/22/2020 2039 03/23/2020 2037 Full Code [id number]  Modena Jansky, MD Inpatient       Prognosis:  Unable to determine  Discharge Planning: To Be Determined  Care plan was discussed with patient wife    Thank you for allowing the Palliative Medicine Team to assist in the care of this patient. High MDM     Greater than 50%  of this time was spent counseling and coordinating care related to the above assessment and plan.  Loistine Chance, MD  Please contact Palliative Medicine Team phone at [phone] for questions and concerns.

## 2022-11-02 DIAGNOSIS — R52 Pain, unspecified: Secondary | ICD-10-CM | POA: Diagnosis not present

## 2022-11-02 MED ORDER — HYDROMORPHONE HCL 2 MG PO TABS: 2.0000 mg | ORAL_TABLET | ORAL | 0 refills | Status: DC | PRN

## 2022-11-02 MED ORDER — FENTANYL 25 MCG/HR TD PT72: 1.0000 | MEDICATED_PATCH | TRANSDERMAL | 0 refills | Status: DC

## 2022-11-02 MED ORDER — POLYETHYLENE GLYCOL 3350 17 G PO PACK: 17.0000 g | PACK | Freq: Two times a day (BID) | ORAL | 0 refills | Status: DC

## 2022-11-02 MED ORDER — SENNA 8.6 MG PO TABS: 2.0000 | ORAL_TABLET | Freq: Two times a day (BID) | ORAL | 0 refills | Status: DC

## 2022-11-02 MED ORDER — ONDANSETRON 4 MG PO TBDP: 4.0000 mg | ORAL_TABLET | ORAL | 0 refills | Status: DC | PRN

## 2022-11-02 MED ORDER — DICLOFENAC SODIUM 1 % EX GEL: 2.0000 g | Freq: Three times a day (TID) | CUTANEOUS | 0 refills | Status: AC | PRN

## 2022-11-02 NOTE — Progress Notes (Signed)
[facility] Hospital Liaison Note  Notified by TOC manager of patient/family request for [facility] palliative services at home after discharge.   [facility] hospital liaison will follow patient for discharge disposition.   Please call with any hospice or outpatient palliative care related questions.   Thank you for the opportunity to participate in this patient's care.   Shanita Wicker, LCSW [facility] Hospital Liaison [phone]  

## 2022-11-02 NOTE — Discharge Summary (Signed)
Physician Discharge Summary  Thomas [name] SWN:[id number] DOB: 10/01/1951 DOA: 10/27/2022  PCP: Michael Boston, MD  Admit date: 10/27/2022 Discharge date: 11/02/2022 Recommendations for Outpatient Follow-up:  Follow up with PCP in 1 weeks-call for appointment Please obtain BMP/CBC in one week  Discharge Dispo: home Discharge Condition: Stable Code Status:   Code Status: Full Code Diet recommendation:  Diet Order             Diet regular Room service appropriate? Yes; Fluid consistency: Thin  Diet effective now                    Brief/Interim Summary: 68yom w/ hx of CVA with residual neuropathic pain, hypertension, GERD, gout, hyperlipidemia, superior mesenteric artery stenosis presented w/ progressive RUQ abdominal pain since 9/23.  He has follow-up with his PCP and was undergoing outpatient evaluation with CT scan pending however he presented to the ED with worsening pain. He report weight loss nonintentional for the last 2 months. He was found to have new destructive lytic lesion in the right iliac bone with concern for metastatic disease, medial right upper lobe pleural-based nodule most likely neoplasm of the lung.  He was also found to have a probably penetrating atherosclerotic ulcer, evaluated by vascular surgery who recommend repeat CTA in 6 months. Seen by Dr. Earlie Server 12/12  advised CT-guided biopsy of the lytic lesion, radiation oncology consultation and MRI of the brain.  Patient was transferred to [facility] 12/14 to help with radiation treatment 12/14: Underwent biopsy of right pelvic bone, started on xrt and pain management.  Pain is now controlled on po dilaudid fentanyl patch.  He feels improved and is being discharged home instructed to follow-up with outpatient radiation oncology.   Discharge Diagnoses:  Principal Problem:   Intractable pain Active Problems:   Essential hypertension   Superior mesenteric artery stenosis (HCC)   Hyperlipidemia   Lytic bone  lesion of hip   Penetrating atherosclerotic ulcer of aorta (HCC)  Right paratracheal nodal mass 4.5 X3.3 cm encasing/narrowing right upper lobe pulmonary artery Right iliac lytic lesion Posterior right ninth rib metastatic erosion 1.7X 1.3 cm medial right upper lobe pleural-based nodule Right hilar and basilar lymphadenopathy Cancer related pain:  Finding concerning for metastatic disease: Oncology IR radiation treatment team on board> s/p CT right pelvic bone core biopsy 10/30/22 and started on radiation treatment.He had extensive imaging with CT angio chest abdomen pelvis > finding concerning for metastatic disease- see report for details. MRI brain negative for metastatic lesion 12/13.  Palliative care following closely for pain management>Dilaudid PCA discontinued 12/14>on Dilaudid p.o. and IV being adjusted per palliative care> on scheduled Tylenol> starting fentanyl patch 25 mcg, he had nausea with oxycodone, continue PRNs as ordered. pain is controlled and discharging home on fentanyl patch and Dilaudid p.o. 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 He needs CTA follow up in 6 month.CTA  Severe atherosclerotic plaque. Narrowing of the infrarenal abdominal aorta, caliber down to 1.1 cm at the level of the kidneys.   History of CVA with residual neuropathic pain on gabapentin Hypertension: Controlled. Cont amlodipine and Avapro. Cont prns GERD continue PPI HLD-cont statin Constipation: Multiple laxatives on board> cont MiraLAX/stool softeners, added Dulcolax suppository x 1 this morning  Consults: PMD, oncology, radiation oncology Subjective:  Alert awake oriented resting Pain is controlled and feels like to go home today  Discharge Exam: Vitals:   11/01/22 1935 11/02/22 0455   BP: (!) 156/76 (!) 162/73  Pulse: 73 70  Resp: 18 18  Temp: 98.5 F (36.9 C) (!) 97.5 F (36.4 C)  SpO2: 96% 96%   General: Pt is alert, awake, not in acute distress Cardiovascular: RRR, S1/S2 +, no rubs, no gallops Respiratory: CTA bilaterally, no wheezing, no rhonchi Abdominal: Soft, NT, ND, bowel sounds + Extremities: no edema, no cyanosis  Discharge Instructions  Discharge Instructions     Amb Referral to Palliative Care   Complete by: As directed    Discharge instructions   Complete by: As directed    Please call call MD or return to ER for similar or worsening recurring problem that brought you to hospital or if any fever,nausea/vomiting,abdominal pain, uncontrolled pain, chest pain,  shortness of breath or any other alarming symptoms.  Please follow-up your doctor as instructed in a week time and call the office for appointment.  Please avoid alcohol, smoking, or any other illicit substance and maintain healthy habits including taking your regular medications as prescribed.  You were cared for by a hospitalist during your hospital stay. If you have any questions about your discharge medications or the care you received while you were in the hospital after you are discharged, you can call the unit and ask to speak with the hospitalist on call if the hospitalist that took care of you is not available.  Once you are discharged, your primary care physician will handle any further medical issues. Please note that NO REFILLS for any discharge medications will be authorized once you are discharged, as it is imperative that you return to your primary care physician (or establish a relationship with a primary care physician if you do not have one) for your aftercare needs so that they can reassess your need for medications and monitor your lab values   Increase activity slowly   Complete by: As directed    No wound care   Complete by: As directed       Allergies as of 11/02/2022        Reactions   Statins    Jaw tightness and severe muscle pain- Pravastatin         Medication List     STOP taking these medications    aspirin EC 81 MG tablet   docusate sodium 100 MG capsule Commonly known as: Colace   HYDROcodone-acetaminophen 5-325 MG tablet Commonly known as: NORCO/VICODIN   ondansetron 4 MG tablet Commonly known as: Zofran   traMADol 50 MG tablet Commonly known as: ULTRAM       TAKE these medications    acetaminophen 650 MG CR tablet Commonly known as: TYLENOL Take 1,300 mg by mouth in the morning and at bedtime.   allopurinol 300 MG tablet Commonly known as: ZYLOPRIM Take 300 mg by mouth daily.   amLODipine 5 MG tablet Commonly known as: NORVASC Take 1 tablet (5 mg total) by mouth daily. What changed: when to take this   diclofenac Sodium 1 % Gel Commonly known as: VOLTAREN Apply 2 g topically 3 (three) times daily as needed for up to 7 days.   fentaNYL 25 MCG/HR Commonly known as: [place] 1 patch onto the skin every 3 (three) days. Start taking on: November 04, 2022   gabapentin 300 MG capsule Commonly known as: NEURONTIN Take 900 mg by mouth at  bedtime.   HYDROmorphone 2 MG tablet Commonly known as: DILAUDID Take 1-2 tablets (2-4 mg total) by mouth every 4 (four) hours as needed for severe pain. 2 mg PO Q4 hr prn pain 1-5/10;4 mg p q4 hr prn pain 6-10/10   ibuprofen 200 MG tablet Commonly known as: ADVIL Take 600 mg by mouth every 8 (eight) hours as needed for moderate pain.   methocarbamol 750 MG tablet Commonly known as: ROBAXIN Take 1 tablet (750 mg total) by mouth every 8 (eight) hours as needed for muscle spasms.   minoxidil 10 MG tablet Commonly known as: LONITEN Take 1-1.5 tablets (10-15 mg total) by mouth 2 (two) times daily. TAKE 1 TABLET IN THE MORNING AND TAKE 1/2 TABLET AT NIGHT What changed:  how much to take additional instructions   ondansetron 4 MG disintegrating tablet Commonly known  as: ZOFRAN-ODT Take 1 tablet (4 mg total) by mouth every 4 (four) hours as needed for nausea or vomiting.   pantoprazole 40 MG tablet Commonly known as: PROTONIX TAKE 1 TABLET BY MOUTH TWICE A DAY   polyethylene glycol 17 g packet Commonly known as: MIRALAX / GLYCOLAX Take 17 g by mouth 2 (two) times daily.   Praluent 150 MG/ML Soaj Generic drug: Alirocumab Inject 1 Pen into the skin every 14 (fourteen) days. What changed: additional instructions   PRESCRIPTION MEDICATION Apply 1 Application topically at bedtime as needed (pain). Compounded pain cream ([facility])   senna 8.6 MG Tabs tablet Commonly known as: SENOKOT Take 2 tablets (17.2 mg total) by mouth 2 (two) times daily.   valsartan 320 MG tablet Commonly known as: DIOVAN TAKE 1 TABLET BY MOUTH [facility] .   Specialty: Radiology Contact information: [address] [id number] [place] [id number] [phone]        Michael Boston, MD Follow up in 1 week(s).   Specialty: Internal Medicine Contact information: [address] [place]  [address] [phone]                Allergies  Allergen Reactions   Statins     Jaw tightness and severe muscle pain- Pravastatin     The results of significant diagnostics from this hospitalization (including imaging, microbiology, ancillary and laboratory) are listed below for reference.    Microbiology: No results found for this or any previous visit (from the past 240 hour(s)).  Procedures/Studies: CT BIOPSY  Result Date: Nov 09, 2022 INDICATION: Concern for metastatic lung cancer by imaging. Large right posterior iliac destructive bone lesion metastatic disease EXAM: CT-GUIDED BIOPSY RIGHT ILIAC BONE METASTASIS MEDICATIONS: % LIDOCAINE LOCAL ANESTHESIA/SEDATION: 1.0 mg IV Versed; 50 mcg IV Fentanyl Moderate Sedation Time:  10 minutes The patient was  continuously monitored during the procedure by the interventional radiology nurse under my direct supervision. PROCEDURE: The procedure, risks, benefits, and alternatives were explained to the patient. Questions regarding the procedure were encouraged and answered. The patient understands and consents to the procedure. Previous imaging reviewed. Patient positioned prone. Noncontrast localization CT performed. The posterior right iliac destructive bone lesion with a large soft tissue component was localized and marked for a posterior approach. Under sterile conditions and local anesthesia, the 17 gauge 6.8 cm guide was advanced to the lesion. Needle position confirmed with CT. 318 gauge cores obtained through the access. Samples were intact and non fragmented. These were placed in formalin. Needle removed. Postprocedure imaging  demonstrates no hemorrhage or hematoma. Patient tolerated the procedure well without complication. Vital sign monitoring by nursing staff during the procedure will continue as patient is in the special procedures unit for post procedure observation. FINDINGS: The images document guide needle placement within the posterior right iliac bone lesion. Post biopsy images demonstrate no complicating feature. COMPLICATIONS: None immediate. IMPRESSION: Successful CT-guided core biopsy of the right iliac bone metastasis RADIATION DOSE REDUCTION: This exam was performed according to the departmental dose-optimization program which includes automated exposure control, adjustment of the mA and/or kV according to patient size and/or use of iterative reconstruction technique. Electronically Signed   By: Jerilynn Mages.  Shick M.D.   On: 10/30/2022 11:24   MR BRAIN WO CONTRAST  Result Date: 10/29/2022 CLINICAL DATA:  Non-small-cell lung cancer, staging EXAM: MRI HEAD WITHOUT CONTRAST TECHNIQUE: Multiplanar, multiecho pulse sequences of the brain and surrounding structures were obtained without intravenous contrast.  COMPARISON:  MRI head 03/22/2020 FINDINGS: Brain: No evidence of metastatic disease on unenhanced imaging. Note that intravenous contrast significantly increases sensitivity for metastatic disease. Ventricle size and cerebral volume normal. Negative for acute infarct or hemorrhage. Scattered T2 and FLAIR hyperintensities in the white matter similar to the prior study and most likely chronic microvascular ischemia. Vascular: Normal arterial flow voids Skull and upper cervical spine: No focal lesion. Sinuses/Orbits: Mild mucosal edema paranasal sinuses. Negative orbit Other: None IMPRESSION: 1. Negative for metastatic disease on unenhanced imaging. 2. Mild chronic microvascular ischemic change in the white matter. Electronically Signed   By: Franchot Gallo M.D.   On: 10/29/2022 11:49   CT Angio Chest/Abd/Pel for Dissection W and/or W/WO  Result Date: 10/28/2022 CLINICAL DATA:  Aortic aneurysm suspected or dissection. EXAM: CT ANGIOGRAPHY CHEST, ABDOMEN AND PELVIS TECHNIQUE: Non-contrast CT of the chest was initially obtained. Multidetector CT imaging through the chest, abdomen and pelvis was performed using the standard protocol during bolus administration of intravenous contrast. Multiplanar reconstructed images and MIPs were obtained and reviewed to evaluate the vascular anatomy. RADIATION DOSE REDUCTION: This exam was performed according to the departmental dose-optimization program which includes automated exposure control, adjustment of the mA and/or kV according to patient size and/or use of iterative reconstruction technique. CONTRAST:  5mL OMNIPAQUE IOHEXOL 350 MG/ML SOLN COMPARISON:  CTA chest and subsequent abdomen and pelvis CT yesterday at 6:50 p.m., CT abdomen and pelvis with IV contrast 01/14/2021. FINDINGS: CTA CHEST FINDINGS Cardiovascular: The heart is slightly enlarged. There is a small anterior pericardial effusion. There are calcifications in the distal left main, proximal LAD and right  coronary arteries. The pulmonary arteries and veins are normal in caliber except for again noted encasement and narrowing of the right upper lobe pulmonary artery by adenopathy. The pulmonary arteries are centrally clear. There is scattered aortic mixed plaque, scattered calcific plaques in the great vessels without stenosis, aneurysm or dissection. Mediastinum/Nodes: Right paratracheal nodal mass measures 4.5 x 3.3 cm on 7:49. A portion of this encases the right upper lobe pulmonary artery with narrowing. A prevascular lymph node right in front of the SVC measures 1.5 x 2.2 cm on 7:51. There is a right hilar lymph node just above the distal right main pulmonary artery measuring 2.4 cm in short axis on 7:61. There is a posterior right hilar node measuring 1.5 cm in short axis on 7:64 and a right mid hilar node of 1 cm in short axis on 7:77. Additional prevascular space node to the right is 2.3 x 1.8 cm on 7:37. No thyroid or axillary  mass is seen. The trachea is clear. The thoracic esophagus unremarkable. Lungs/Pleura: Small layering right pleural effusion possibly malignant. Nodular pleural thickening measuring 3.3 x 1.4 cm is noted posterior to the right hemidiaphragm on 7:117 most likely indicating pleural metastasis. Tumor extension into the chest wall is suspected through the right eighth intercostal space posteriorly and there is a rind of pleural thickening in this area as well (see series 7 axial images 93-101). There is a right upper lobe pleural-based pulmonary nodule anteromedially on 6:22 measuring 1.7 x 1.3 cm with small lobulations most likely a neoplasm and possibly the primary neoplasm. In the anterior basal segment of the right lower lobe there is a pleural-based nodule measuring 8 x 6 mm on 6:43. An additional pleural nodular focus suspected on 7:51 in the posterior right upper lung field. There is patchy ground-glass opacity posteriorly in the right upper lobe which could be due to pneumonitis or  lymphangitic carcinomatosis. No other focal infiltrate or further nodules are seen. No left pleural effusion. Musculoskeletal: There is erosion in the anterior aspect of the posterior right ninth rib where the pleural thickening extends through the eighth intercostal space consistent with early metastatic rib destruction. No other focal bone lesion is seen. There is no pathologic fracture. Review of the MIP images confirms the above findings. CTA ABDOMEN AND PELVIS FINDINGS VASCULAR Aorta: There is heavy mixed plaque. The aortic lumen is narrowed to 1 cm focally in the proximal infrarenal segment, otherwise does not show flow limiting stenosis. In the mid infrarenal aorta there is a small saccular aneurysm along the left wall measuring 9 mm at the base and 5 mm in height. There is no dissection. Celiac: There is 75% focal stenosis 1 cm distal to the vessel origin due to compression by the median arcuate ligament of the diaphragm. The vessel otherwise opacifies well. SMA: No flow-limiting stenosis, aneurysm or dissection. Renals: Single left renal artery is widely patent. There are 2 right renal arteries, the larger of which supplies the upper to midpole, the smaller of which arises anterior to the larger artery and supplies the inferior pole. Both are widely patent. IMA: No flow-limiting stenosis. Inflow: There is mixed plaque in the common iliac arteries with up to 50% stenosis on the left, no stenosis on the right. The internal iliac arteries show patchy calcific plaques without stenosis. Both external iliac arteries are widely patent. Proximal outflow arteries: Small amount of nonstenosing calcification in the right common femoral artery. No other significant findings of the proximal outflow vessels. Veins: Patent portal vein.  Other veins are unopacified. Review of the MIP images confirms the above findings. NON-VASCULAR Hepatobiliary: The liver is 21 cm length with mild steatosis. There is a 1.2 cm cyst in the  right lobe laterally. Subcentimeter hypodensity more inferiorly in the right lobe is too small to characterize but was present on both prior studies. No mass enhancement is seen. Gallbladder is distended, otherwise unremarkable no biliary dilatation. Pancreas: No abnormality. Spleen: No abnormality. Adrenals/Urinary Tract: There is no adrenal mass. There are small bilateral renal cysts and a few nonobstructive caliceal stones in the kidneys. Additional subcentimeter renal hypodensities are too small characterize but no follow-up is recommended. There is no hydronephrosis or obstructing stone. No bladder thickening is seen. Stomach/Bowel: Unremarkable stomach and small bowel, normal appendix. Moderate stool retention ascending colon. Left colonic diverticulosis without evidence of diverticulitis. Lymphatic: No appreciable adenopathy. Reproductive: Mildly enlarged but poorly visualized prostate due to artifact from right hip replacement. Other:  Right lower quadrant surgical clips. No free air, hemorrhage or free fluid. Small umbilical fat hernia. Musculoskeletal: Again noted is a destructive lesion of the medial right iliac crest measuring 5.6 x 5.1 cm on 7:235. There is ankylosis across the SI joints. Degenerative changes lumbar spine. Posterior endplate Schmorl's nodes L2-3. No other visible structure bone lesion. Review of the MIP images confirms the above findings. IMPRESSION: 1. Aortic and coronary artery atherosclerosis 2. Small anterior pericardial effusion.  Mild cardiomegaly. 3. No findings of acute aortic syndrome. 4. 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 5. 1.7 x 1.3 cm medial right upper lobe pleural-based nodule most likely a neoplasm and possibly the primary neoplasm. 6. Posterior segment right upper lobe ground-glass opacities consistent with pneumonitis or lymphangitic carcinomatosis. 7. Right hilar and  mediastinal adenopathy with a 4.5 x 3.3 cm right paratracheal nodal mass encasing and narrowing the right upper lobe pulmonary artery. 8. 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 9. Metastatic erosion of the anterior aspect of the posterior right ninth rib, and a 5.6 x 5.1 cm destructive lesion of the medial right iliac crest. 10. Nonobstructive nephrolithiasis. 11. Constipation and diverticulosis. 12. 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 13. Additional findings described above. Aortic Atherosclerosis (ICD10-I70.0). Electronically Signed   By: Telford Nab M.D.   On: 10/28/2022 06:37   CT Angio Abd/Pel W and/or Wo Contrast  Result Date: 10/27/2022 CLINICAL DATA:  Mesenteric ischemia, chronic EXAM: CTA ABDOMEN AND PELVIS WITH CONTRAST TECHNIQUE: Multidetector CT imaging of the abdomen and pelvis was performed using the standard protocol during bolus administration of intravenous contrast. Multiplanar reconstructed images and MIPs were obtained and reviewed to evaluate the vascular anatomy. RADIATION DOSE REDUCTION: This exam was performed according to the departmental dose-optimization program which includes automated exposure control, adjustment of the mA and/or kV according to patient size and/or use of iterative reconstruction technique. CONTRAST:  26mL OMNIPAQUE IOHEXOL 350 MG/ML SOLN COMPARISON:  CT abdomen pelvis 10/27/2022 12:05 p.m., CT abdomen pelvis 01/14/2021 FINDINGS: VASCULAR Aorta: Slightly limited evaluation due to timing of contrast. Severe atherosclerotic plaque. Narrowing of the infrarenal abdominal aorta caliber down to 1.1 cm at the level of the kidneys. Otherwise normal aorta without aneurysm, dissection, vasculitis. Celiac: Patent without evidence of  aneurysm, dissection, vasculitis or significant stenosis. SMA: Patent without evidence of aneurysm, dissection, vasculitis or significant stenosis. Renals: Both renal arteries are patent without evidence of aneurysm, dissection, vasculitis, fibromuscular dysplasia or significant stenosis. IMA: Patent without evidence of aneurysm, dissection, vasculitis or significant stenosis. Inflow: Patent without evidence of aneurysm, dissection, vasculitis or significant stenosis. Proximal Outflow: Bilateral common femoral and visualized portions of the superficial and profunda femoral arteries are patent without evidence of aneurysm, dissection, vasculitis or significant stenosis. Veins: No portal venous or mesenteric venous gas. The main portal, splenic, superior mesenteric veins are patent. Review of the MIP images confirms the above findings. NON-VASCULAR Lower chest: Please see separately dictated CT angiography chest 10/27/2022. Hepatobiliary: There is a 1.2 cm right hepatic lobe fluid density lesion that likely represents a hepatic cyst. Subcentimeter hypodensity too small to characterize no gallstones, gallbladder wall thickening, or pericholecystic fluid. No biliary dilatation. Pancreas: No focal lesion. Normal pancreatic contour. No surrounding  inflammatory changes. No main pancreatic ductal dilatation. Spleen: Normal in size without focal abnormality. Adrenals/Urinary Tract: No adrenal nodule bilaterally.  Fluid Bilateral kidneys enhance symmetrically. Density lesions within the kidneys likely represent simple renal cysts. Simple renal cysts, in the absence of clinically indicated signs/symptoms, require no independent follow-up. Subcentimeter hypodensities are too small to characterize-no further follow-up indicated. No hydronephrosis. No hydroureter. The urinary bladder is unremarkable. There is no urothelial wall thickening and there are no filling defects in the opacified portions of the bilateral collecting  systems or ureters. Stomach/Bowel: Stomach is within normal limits. No evidence of bowel wall thickening or dilatation. Colonic diverticulosis. Appendix appears normal. Lymphatic: No lymphadenopathy. Reproductive: The prostate is enlarged measuring up to 5 cm. Other: Surgical clips noted within the right lower quadrant. No intraperitoneal free fluid. No intraperitoneal free gas. No organized fluid collection. Musculoskeletal: Tiny fat containing umbilical hernia. Destructive lytic lesion of the right iliac with associated 5.7 x 4.6 cm soft tissue density. No acute displaced fracture. Multilevel degenerative changes of the spine. Total right hip arthroplasty. IMPRESSION: VASCULAR 1. Aortic Atherosclerosis (ICD10-I70.0) -severe leading to infrarenal abdominal aortic stenosis with caliber down to 1 cm. NON-VASCULAR 1. Colonic diverticulosis with no acute diverticulitis. 2. Prostatomegaly. 3. Destructive lytic lesion of the right iliac with associated 5.7 x 4.6 cm soft tissue density. Finding concerning for malignancy/metastatic disease. 4. Please see separately CT angiography chest 10/27/2022 Electronically Signed   By: Iven Finn M.D.   On: 10/27/2022 20:10   CT Angio Chest PE W and/or Wo Contrast  Result Date: 10/27/2022 CLINICAL DATA:  Worsening right-sided pain, possible pulmonary embolus EXAM: CT ANGIOGRAPHY CHEST WITH CONTRAST TECHNIQUE: Multidetector CT imaging of the chest was performed using the standard protocol during bolus administration of intravenous contrast. Multiplanar CT image reconstructions and MIPs were obtained to evaluate the vascular anatomy. RADIATION DOSE REDUCTION: This exam was performed according to the departmental dose-optimization program which includes automated exposure control, adjustment of the mA and/or kV according to patient size and/or use of iterative reconstruction technique. CONTRAST:  10mL OMNIPAQUE IOHEXOL 350 MG/ML SOLN COMPARISON:  Chest radiograph 03/22/2020  FINDINGS: Cardiovascular: Reduced caliber of the right upper lobe pulmonary artery due to mass effect from surrounding tumor. No definite filling defect is identified in the pulmonary arterial tree to suggest pulmonary embolus. Coronary, aortic arch, and branch vessel atherosclerotic vascular disease. Mild cardiomegaly. Mediastinum/Nodes: Right paratracheal node/mass 3.1 cm in short axis on image 49 series 5. Prevascular node 1.6 cm in short axis, image 48 series 5. Right hilar lymph node 2.5 cm in short axis, image 62 series 5. Additional indistinct prevascular lymph nodes noted on the right. Lungs/Pleura: There is a rind of density along the posterior pleural margin along the right lower lobe, suspicious for pleural tumor, about 1.9 cm in thickness on image 100 series 5. Adjacent erosion of the right ninth rib on image 42 series 10, with suspected tumor extension into the chest wall in the eighth intercostal space. Right upper lobe pulmonary nodule 1.8 by 1.2 cm on image 51 series 6. Right lower lobe pleural-based nodule along the major fissure, 0.9 by 0.5 cm on image 84 series 6. Nodularity along the pleural margin of the right posterior diaphragm measuring 2.7 by 1.1 cm on image 73 series 10. Airway thickening is present, suggesting bronchitis or reactive airways disease. Calcified granuloma in the superior segment right lower lobe (benign). Mild perihilar ground-glass opacity in the right upper lobe is indistinct and likely inflammatory on image 50  series 6. Upper Abdomen: Hypodense 1.5 cm lesion in the right hepatic lobe on image 111 of series 5, fluid density and most likely to be a cyst. Thoracic spondylosis. Musculoskeletal: As noted above, the pleural mass on the right is associated with a erosion of the top of the right ninth rib and extension into the right eighth intercostal space compatible chest wall invasion. Review of the MIP images confirms the above findings. IMPRESSION: 1. No filling defect in  the pulmonary arterial tree to suggest pulmonary embolus. Reduced caliber of the right upper lobe pulmonary artery due to mass effect from the surrounding tumor. 2. Pleural mass along the right lower lobe posteriorly with erosion of the right ninth rib and extension into the right eighth intercostal space compatible with chest wall invasion. 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 3. Right upper lobe pulmonary nodule 1.8 by 1.2 cm, suspicious for malignancy. 4. Right hilar, right paratracheal, and prevascular adenopathy compatible with malignancy. 5. Airway thickening is present, suggesting bronchitis or reactive airways disease. 6. Mild cardiomegaly. Coronary, aortic arch, and branch vessel atherosclerotic vascular disease. 7. Hypodense 1.5 cm lesion in the right hepatic lobe is fluid density and most likely to be a cyst. 8. Aortic atherosclerosis. 9. Mild perihilar ground-glass opacity in the right upper lobe is indistinct and likely inflammatory. Aortic Atherosclerosis (ICD10-I70.0). Electronically Signed   By: Van Clines M.D.   On: 10/27/2022 19:57   CT Abdomen Pelvis W Contrast  Result Date: 10/27/2022 CLINICAL DATA:  Abdominal pain EXAM: CT ABDOMEN AND PELVIS WITH CONTRAST TECHNIQUE: Multidetector CT imaging of the abdomen and pelvis was performed using the standard protocol following bolus administration of intravenous contrast. RADIATION DOSE REDUCTION: This exam was performed according to the departmental dose-optimization program which includes automated exposure control, adjustment of the mA and/or kV according to patient size and/or use of iterative reconstruction technique. CONTRAST:  95mL OMNIPAQUE IOHEXOL 350 MG/ML SOLN COMPARISON:  Right upper quadrant ultrasound obtained the same day, hip radiographs 06/30/2022, pelvis radiographs 08/14/2022, lumbar spine MRI 04/14/2021 FINDINGS: Lower chest: There is soft tissue attenuating pleural thickening along the posterior  right lower lobe (3-3) measuring up to 1.0 cm in thickness and 5.1 cm transverse. There is additional nodular pleural thickening along the medial aspect of the right hemidiaphragm measuring up to 2.7 cm x 1.0 cm (6-62). The imaged heart is unremarkable. Hepatobiliary: A 1.5 cm hypodense lesion in the right hepatic lobe most likely reflects a benign cyst requiring no specific imaging follow-up. There are no other focal liver lesions. The gallbladder is unremarkable. There is no biliary ductal dilatation. Pancreas: Unremarkable. Spleen: Unremarkable. Adrenals/Urinary Tract: The adrenals are unremarkable. There are multiple hypodense lesions in both kidneys likely reflecting benign cysts requiring no specific imaging follow-up. There are no suspicious renal lesions. There are no stones in either kidney or along the course of either ureter. There is symmetric excretion of contrast into the collecting systems on the delayed images. The bladder is unremarkable. Stomach/Bowel: The stomach is unremarkable. There is no evidence of bowel obstruction. There is extensive colonic diverticulosis. There is no convincing evidence of acute diverticulitis or primary colonic neoplasm. Appendix is normal. Vascular/Lymphatic: There is extensive mixed plaque in the nonaneurysmal abdominal aorta. 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 The major aortic branch vessels are patent. The main portal and splenic veins are patent. Reproductive: The prostate and seminal vesicles are grossly unremarkable  though largely obscured by streak artifact from the right hip arthroplasty hardware. Other: There is no ascites or free air. There is a small fat containing umbilical hernia. Postsurgical changes are noted along the right ventral abdominal wall. Musculoskeletal: There is a destructive lytic lesion in the right iliac bone with soft tissue  extension measuring up to 5.8 cm TV x 4.3 cm AP x 4.7 cm cc. This lesion was not present on the lumbar spine MRI from 04/14/2021. There is a prominent Schmorl's node indenting the inferior L2 endplate. There is no other aggressive or destructive osseous lesion. Right hip arthroplasty hardware is noted. IMPRESSION: 1. 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 2. Soft tissue attenuating pleural thickening along the posterior right lower lobe and along the medial aspect of the right hemidiaphragm are suspicious for pleural metastases. Recommend oncologic workup and dedicated CT chest with contrast. 3. Extensive colonic diverticulosis without evidence of acute diverticulitis. No convincing evidence of primary colonic neoplasm. 4. 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 Recommend vascular referral and nonemergent CTA abdomen/pelvis. Electronically Signed   By: Valetta Mole M.D.   On: 10/27/2022 12:40   US Abdomen Limited RUQ (LIVER/GB)  Result Date: 10/27/2022 CLINICAL DATA:  RIGHT upper quadrant pain EXAM: ULTRASOUND ABDOMEN LIMITED RIGHT UPPER QUADRANT COMPARISON:  February 2022 FINDINGS: Gallbladder: No gallstones or wall thickening visualized. No sonographic Murphy sign noted by sonographer. Common bile duct: Diameter: 5.5 mm, without intrahepatic biliary duct distension. Liver: Increased hepatic echogenicity without visible lesion on submitted images. Study limited by body habitus. Portal vein is patent on color Doppler imaging with normal direction of blood flow towards the liver. Other:  no ascites IMPRESSION: Hepatic steatosis without acute findings. Electronically Signed   By: Zetta Bills M.D.   On: 10/27/2022 10:34     Labs: BNP (last 3 results) No results for input(s): "BNP" in the last 8760 hours. Basic Metabolic Panel: Recent Labs  Lab 10/27/22 1002 10/28/22 0204 10/29/22 0944  NA 138 139 141  139  K 4.0 4.0 4.2  4.1  CL 100 104 107  103  CO2 27 27 25  26   GLUCOSE 135* 102* 110*  111*  BUN 16 13 16  16   CREATININE 1.04 0.94 1.00  1.05  CALCIUM 9.3 8.8* 8.8*  8.6*   Liver Function Tests: Recent Labs  Lab 10/27/22 1002 10/28/22 0204 10/29/22 0944  AST 17 14* 17  ALT 17 15 17   ALKPHOS 104 93 89  BILITOT 0.5 0.5 0.5  PROT 7.0 6.3* 6.1*  ALBUMIN 4.1 3.5 3.6   Recent Labs  Lab 10/27/22 1002  LIPASE 34   No results for input(s): "AMMONIA" in the last 168 hours. CBC: Recent Labs  Lab 10/27/22 1002 10/28/22 0204 10/30/22 0616  WBC 10.5 9.6 9.6  NEUTROABS 8.7*  --   --   HGB 12.8* 11.8* 12.8*  HCT 41.2 36.6* 42.6  MCV 83.6 81.5 85.5  PLT 306 285 311   Cardiac Enzymes: No results for input(s): "CKTOTAL", "CKMB", "CKMBINDEX", "TROPONINI" in the last 168 hours. BNP: Invalid input(s): "POCBNP" CBG: No results for input(s): "GLUCAP" in the last 168 hours. D-Dimer No results for input(s): "DDIMER" in the last 72 hours. Hgb A1c No results for input(s): "HGBA1C" in the  last 72 hours. Lipid Profile No results for input(s): "CHOL", "HDL", "LDLCALC", "TRIG", "CHOLHDL", "LDLDIRECT" in the last 72 hours. Thyroid function studies No results for input(s): "TSH", "T4TOTAL", "T3FREE", "THYROIDAB" in the last 72 hours.  Invalid input(s): "FREET3" Anemia work up No results for input(s): "VITAMINB12", "FOLATE", "FERRITIN", "TIBC", "IRON", "RETICCTPCT" in the last 72 hours. Urinalysis    Component Value Date/Time   COLORURINE YELLOW 10/27/2022 1016   APPEARANCEUR CLEAR 10/27/2022 1016   LABSPEC 1.019 10/27/2022 1016   PHURINE 5.0 10/27/2022 1016   GLUCOSEU NEGATIVE 10/27/2022 1016   HGBUR NEGATIVE 10/27/2022 1016   BILIRUBINUR NEGATIVE 10/27/2022 1016   BILIRUBINUR neg  08/27/2015 1103   KETONESUR NEGATIVE 10/27/2022 1016   PROTEINUR NEGATIVE 10/27/2022 1016   UROBILINOGEN 0.2 08/27/2015 1103   NITRITE NEGATIVE 10/27/2022 1016   LEUKOCYTESUR NEGATIVE 10/27/2022 1016   Sepsis Labs Recent Labs  Lab 10/27/22 1002 10/28/22 0204 10/30/22 0616  WBC 10.5 9.6 9.6   Microbiology No results found for this or any previous visit (from the past 240 hour(s)).   Time coordinating discharge: 35 minutes  SIGNED: Antonieta Pert, MD  [facility] 11/02/2022, 11:37 AM  If 7PM-7AM, please contact night-coverage [email or web address]

## 2022-11-02 NOTE — TOC Transition Note (Signed)
Transition of Care [facility]) - CM/SW Discharge Note   Patient Details  Name: Thomas [name] MRN: [id number] Date of Birth: Jul 26, 1951  Transition of Care [facility]) CM/SW Contact:  Lennart Pall, LCSW Phone Number: 11/02/2022, 1:40 PM   Clinical Narrative:     Met with pt who is agreeable for TOC/ CSW to make referral for community Palliative Care and pt chooses [facility].  Referral placed with Roselee Nova, RN and pt dc home with family.    Final next level of care: Home/Self Care (with Palliative Care) Barriers to Discharge: No Barriers Identified   Patient Goals and CMS Choice Patient states their goals for this hospitalization and ongoing recovery are:: return home      Discharge Placement                       Discharge Plan and Services                DME Arranged: N/A DME Agency: NA                  Social Determinants of Health (SDOH) Interventions     Readmission Risk Interventions    11/02/2022    1:39 PM  Readmission Risk Prevention Plan  Post Dischage Appt Complete  Medication Screening Complete  Transportation Screening Complete

## 2022-11-02 NOTE — Progress Notes (Signed)
Daily Progress Note   [name]       Date: 11/02/2022 DOB: Mar 14, 1951  Age: 71 y.o. MRN#: [id number] Attending Physician: Antonieta Pert, MD Primary Care Physician: Michael Boston, MD Admit Date: 10/27/2022  Reason for Consultation/Follow-up: Pain control  Subjective: Discussed with patient and wife about pain regimen and bowel regimen, anticipate discharge today,see below.  Length of Stay: 6  Current Medications: Scheduled Meds:   acetaminophen  650 mg Oral Q8H   [START ON 11/09/2022] Alirocumab  150 mg Subcutaneous Q14 Days   allopurinol  300 mg Oral Daily   amLODipine  5 mg Oral Daily   fentaNYL  1 patch Transdermal Q72H   gabapentin  900 mg Oral QHS   heparin  5,000 Units Subcutaneous Q8H    HYDROmorphone (DILAUDID) injection  2 mg Intravenous Q6H   HYDROmorphone  2 mg Oral Q4H   irbesartan  300 mg Oral Daily   lidocaine  2 patch Transdermal Q24H   pantoprazole  40 mg Oral BID   polyethylene glycol  17 g Oral BID   senna  2 tablet Oral BID   traZODone  25 mg Oral QHS    Continuous Infusions:   PRN Meds: albuterol, HYDROmorphone **AND** HYDROmorphone, methocarbamol, ondansetron (ZOFRAN) IV, ondansetron  Physical Exam         Awake alert resting in bed Complains of pain: L sided and also generalized Complains of generalized pain Regular work of breathing No edema Abdomen is not distended  Vital Signs: BP (!) 162/73 (BP Location: Left Arm)   Pulse 70   Temp (!) 97.5 F (36.4 C) (Oral)   Resp 18   Ht 5\' 11"  (1.803 m)   Wt 85.5 kg   SpO2 96%   BMI 26.29 kg/m  SpO2: SpO2: 96 % O2 Device: O2 Device: Room Air O2 Flow Rate: O2 Flow Rate (L/min): 2 L/min  Intake/output summary: No intake or output data in the 24 hours ending 11/02/22 1236  LBM:  Last BM Date : 11/02/22 Baseline Weight: Weight: 88.5 kg Most recent weight: Weight: 85.5 kg       Palliative Assessment/Data:      Patient Active Problem List   Diagnosis Date Noted   Lytic bone lesion of hip 10/28/2022   Penetrating atherosclerotic ulcer of aorta ([place]) 10/28/2022  Intractable pain 10/27/2022   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   Preoperative cardiovascular examination 06/05/2022   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   Primary osteoarthritis of left hip 03/19/2022   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   Chronic gouty arthritis 04/23/2021   Primary osteoarthritis 04/23/2021   Abdominal wall pain 04/02/2021   Dysphagia 04/02/2021   Right flank pain 04/02/2021   Wears glasses    Left ureteral calculus    Hypertension    Hyperlipidemia    History of squamous cell carcinoma excision    Chronic gout    Right sided abdominal pain 01/14/2021   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/24/2020   Acute ischemic stroke ([place]) 03/22/2020   It band syndrome, left 03/19/2020   Metatarsalgia of both feet 03/19/2020   Superior mesenteric artery stenosis ([place]) 02/24/2020   Polyarthralgia 07/16/2018   Essential hypertension 07/21/2017   Gout of multiple sites 07/21/2017    Palliative Care Assessment & Plan   Patient Profile:    Assessment: Uncontrolled pain Recently evaluated by Dr. Earlie Server for high suspicion for metastatic neoplasm-likely lung cancer however other malignancy cannot be excluded.  Patient presented with pleural-based metastases in addition to right upper lobe lung nodule and right hilar and mediastinal adenopathy History of stroke with neuropathic pain Lytic bone lesion on hip on the right iliac bone, status post biopsy results pending.   Recommendations/Plan: Pain management, nausea and constipation management options discussed today:       P.o. Dilaudid 2-4 mg to be used on an as-needed basis  25 mcg  trans dermal fentanyl patch from 11-01-22, patient had nausea with oxycodone and does not believe he can tolerate long acting PO medication.    bowel regimen: change Senokot and Miralax to BID,     continue as needed antiemetic regimen TOC consult to discuss disposition Anticipate d/c home with home health and outpatient palliative on 11-02-22. Discussed with nursing colleague discussed with [facility] MD.  Patient will need palliative follow-up at [place] with Ms. Jene Every, NP.  Opioid regimen discussed with [facility] MD on the discharge medication list.  Goals of Care and Additional Recommendations: Limitations on Scope of Treatment: Full Scope Treatment  Code Status:    Code Status Orders  (From admission, onward)           Start     Ordered   10/27/22 2232  Full code  Continuous        10/27/22 2236           Code Status History     Date Active Date Inactive Code Status Order ID Comments User Context   06/30/2022 1107 07/01/2022 1612 Full Code [id number]  Leandrew Koyanagi, MD Inpatient   03/22/2020 2039 03/23/2020 2037 Full Code [id number]  Modena Jansky, MD Inpatient       Prognosis:  Unable to determine  Discharge Planning: To Be Determined  Care plan was discussed with patient wife    Thank you for allowing the Palliative Medicine Team to assist in the care of this patient. High MDM     Greater than 50%  of this time was spent counseling and coordinating care related to the above assessment and plan.  Loistine Chance, MD  Please contact Palliative Medicine Team phone at [phone] for questions and concerns.

## 2022-11-02 NOTE — Progress Notes (Signed)
Wayne Both to be D/C'd per MD order. Discussed with the patient and all questions fully answered. ? VSS, Skin clean, dry and intact without evidence of skin break down. ? IV catheter discontinued intact. Site without signs and symptoms of complications. Dressing and pressure applied. ? An After Visit Summary was printed and given to the patient. Patient informed where to pickup prescriptions. ? D/c education completed with patient/family including follow up instructions, medication list, d/c activities limitations if indicated, with other d/c instructions as indicated by MD - patient able to verbalize understanding, all questions fully answered.  ? Patient instructed to return to ED, call 911, or call MD for any changes in condition.   All belongings returned to patient including cell phone, tablet, clothing, cpap. ? Patient to be escorted via [place], and D/C home via private auto.

## 2022-11-03 ENCOUNTER — Other Ambulatory Visit: Payer: Self-pay

## 2022-11-03 ENCOUNTER — Other Ambulatory Visit: Payer: Self-pay | Admitting: Internal Medicine

## 2022-11-03 ENCOUNTER — Encounter: Payer: Self-pay | Admitting: Nurse Practitioner

## 2022-11-03 ENCOUNTER — Inpatient Hospital Stay: Payer: Medicare HMO | Attending: Nurse Practitioner | Admitting: Nurse Practitioner

## 2022-11-03 ENCOUNTER — Inpatient Hospital Stay: Payer: Medicare HMO

## 2022-11-03 ENCOUNTER — Ambulatory Visit
Admission: RE | Admit: 2022-11-03 | Discharge: 2022-11-03 | Disposition: A | Discharge status: Home / Self Care | Payer: Medicare HMO | Source: Ambulatory Visit | Attending: Radiation Oncology | Admitting: Radiation Oncology

## 2022-11-03 VITALS — BP 165/64 | HR 93 | Temp 98.6°F | Resp 16 | Wt 196.4 lb

## 2022-11-03 DIAGNOSIS — Z79899 Other long term (current) drug therapy: Secondary | ICD-10-CM | POA: Insufficient documentation

## 2022-11-03 DIAGNOSIS — K760 Fatty (change of) liver, not elsewhere classified: Secondary | ICD-10-CM | POA: Insufficient documentation

## 2022-11-03 DIAGNOSIS — M792 Neuralgia and neuritis, unspecified: Secondary | ICD-10-CM

## 2022-11-03 DIAGNOSIS — I3139 Other pericardial effusion (noninflammatory): Secondary | ICD-10-CM | POA: Diagnosis not present

## 2022-11-03 DIAGNOSIS — R918 Other nonspecific abnormal finding of lung field: Secondary | ICD-10-CM | POA: Diagnosis not present

## 2022-11-03 DIAGNOSIS — Z87442 Personal history of urinary calculi: Secondary | ICD-10-CM | POA: Diagnosis not present

## 2022-11-03 DIAGNOSIS — M898X5 Other specified disorders of bone, thigh: Secondary | ICD-10-CM

## 2022-11-03 DIAGNOSIS — G893 Neoplasm related pain (acute) (chronic): Secondary | ICD-10-CM | POA: Diagnosis not present

## 2022-11-03 DIAGNOSIS — R530 Neoplastic (malignant) related fatigue: Secondary | ICD-10-CM | POA: Diagnosis not present

## 2022-11-03 DIAGNOSIS — M109 Gout, unspecified: Secondary | ICD-10-CM | POA: Diagnosis not present

## 2022-11-03 DIAGNOSIS — Z7189 Other specified counseling: Secondary | ICD-10-CM

## 2022-11-03 DIAGNOSIS — E785 Hyperlipidemia, unspecified: Secondary | ICD-10-CM | POA: Insufficient documentation

## 2022-11-03 DIAGNOSIS — Z87891 Personal history of nicotine dependence: Secondary | ICD-10-CM | POA: Diagnosis not present

## 2022-11-03 DIAGNOSIS — K5900 Constipation, unspecified: Secondary | ICD-10-CM | POA: Insufficient documentation

## 2022-11-03 DIAGNOSIS — Z510 Encounter for antineoplastic radiation therapy: Secondary | ICD-10-CM | POA: Diagnosis not present

## 2022-11-03 DIAGNOSIS — K5730 Diverticulosis of large intestine without perforation or abscess without bleeding: Secondary | ICD-10-CM | POA: Insufficient documentation

## 2022-11-03 DIAGNOSIS — Z515 Encounter for palliative care: Secondary | ICD-10-CM | POA: Diagnosis not present

## 2022-11-03 DIAGNOSIS — J90 Pleural effusion, not elsewhere classified: Secondary | ICD-10-CM | POA: Insufficient documentation

## 2022-11-03 DIAGNOSIS — I6782 Cerebral ischemia: Secondary | ICD-10-CM | POA: Insufficient documentation

## 2022-11-03 DIAGNOSIS — K828 Other specified diseases of gallbladder: Secondary | ICD-10-CM | POA: Diagnosis not present

## 2022-11-03 DIAGNOSIS — C7951 Secondary malignant neoplasm of bone: Secondary | ICD-10-CM | POA: Diagnosis not present

## 2022-11-03 DIAGNOSIS — C3411 Malignant neoplasm of upper lobe, right bronchus or lung: Secondary | ICD-10-CM | POA: Diagnosis not present

## 2022-11-03 DIAGNOSIS — I10 Essential (primary) hypertension: Secondary | ICD-10-CM | POA: Insufficient documentation

## 2022-11-03 DIAGNOSIS — K219 Gastro-esophageal reflux disease without esophagitis: Secondary | ICD-10-CM | POA: Insufficient documentation

## 2022-11-03 DIAGNOSIS — C3490 Malignant neoplasm of unspecified part of unspecified bronchus or lung: Secondary | ICD-10-CM

## 2022-11-03 DIAGNOSIS — C3431 Malignant neoplasm of lower lobe, right bronchus or lung: Secondary | ICD-10-CM | POA: Diagnosis not present

## 2022-11-03 LAB — RAD ONC ARIA SESSION SUMMARY
Course Elapsed Days: 4
Plan Fractions Treated to Date: 3
Plan Fractions Treated to Date: 3
Plan Prescribed Dose Per Fraction: 3 Gy
Plan Prescribed Dose Per Fraction: 3 Gy
Plan Total Fractions Prescribed: 10
Plan Total Fractions Prescribed: 10
Plan Total Prescribed Dose: 30 Gy
Plan Total Prescribed Dose: 30 Gy
Reference Point Dosage Given to Date: 9 Gy
Reference Point Dosage Given to Date: 9 Gy
Reference Point Session Dosage Given: 3 Gy
Reference Point Session Dosage Given: 3 Gy
Session Number: 3

## 2022-11-03 MED ORDER — HYDROMORPHONE HCL 2 MG PO TABS: 2.0000 mg | ORAL_TABLET | ORAL | 0 refills | Status: DC | PRN

## 2022-11-03 MED ORDER — KETOROLAC TROMETHAMINE 30 MG/ML IJ SOLN
30.0000 mg | Freq: Once | INTRAMUSCULAR | Status: AC
  Administered 2022-11-03: 30 mg via INTRAMUSCULAR

## 2022-11-03 MED ORDER — KETOROLAC TROMETHAMINE 30 MG/ML IJ SOLN
30.0000 mg | Freq: Once | INTRAMUSCULAR | Status: DC
  Filled 2022-11-03: qty 1

## 2022-11-03 MED ORDER — FENTANYL 12 MCG/HR TD PT72: 1.0000 | MEDICATED_PATCH | TRANSDERMAL | 0 refills | Status: DC

## 2022-11-03 NOTE — Patient Instructions (Addendum)
-   pick up the 42mcg patch of fentanyl and use it WITH the 10mcg patch, place a new 69mcg patch AND 52mcg patch tomorrow (11/04/22) for a slow release of pain medication. - pick up refill of dilaudid - try to time your dilaudid 30 mins to 1 hour prior to radiation  -continue with your laxatives for good bowel movements as pain meds can cause constipation  - let us know if you have any dizziness or nausea or vomiting, if you are experiencing a medical emergency (chest pain, new shortness of breath, etc.) call 911 - call the office [phone] with any question or concerns or MyChart message Lexine Baton [employer] on [place])

## 2022-11-03 NOTE — Progress Notes (Unsigned)
[place]  [phone] [phone]) [phone]   Name: Thomas [name] Date: 11/03/2022 MRN: [id number]  DOB: 03/13/1951  Patient Care Team: Jimmy Footman, NP as PCP - General (Nurse Practitioner) Park Liter, MD as PCP - Cardiology (Cardiology)    REASON FOR CONSULTATION: Thomas [name] is a 71 y.o. male with oncologic medical history including right iliac lytic lesion, right upper lob nodule (pending pathology), right paratracheal nodule, and right hilar and mediastinal adenopathy. He is currently undergoing radiation to right iliac and right rib. Pending additional oncology interventions based on pathology report.  Palliative ask to see for symptom management and goals of care.    SOCIAL HISTORY:     reports that he quit smoking about 28 years ago. His smoking use included cigarettes. He has a 6.00 pack-year smoking history. He has never used smokeless tobacco. He reports that he does not currently use alcohol. He reports that he does not use drugs.  ADVANCE DIRECTIVES:    CODE STATUS:   PAST MEDICAL HISTORY: Past Medical History:  Diagnosis Date   Abdominal wall pain 04/02/2021   Acute ischemic stroke ([place]) 03/22/2020   Avulsed toenail, initial encounter 05/04/2018   Dyspnea on exertion 02/10/2020   ED (erectile dysfunction)    Epidermoid cyst of neck 10/16/2017   Essential hypertension 07/21/2017   GERD (gastroesophageal reflux disease)    Gout of multiple sites 07/21/2017   History of kidney stones    History of squamous cell carcinoma excision    Hyperlipidemia    Hypertension not at goal 01/19/2014   It band syndrome, left 03/19/2020   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/24/2020   Left ureteral calculus    Lower abdominal pain 08/27/2015   Metatarsalgia of both feet 03/19/2020   Multiple joint pain 07/16/2018   Pain in joint, shoulder region 12/11/2015   Right flank pain  04/02/2021   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   Urgency of urination    URI (upper respiratory infection) 11/08/2014   Wears glasses     PAST SURGICAL HISTORY:  Past Surgical History:  Procedure Laterality Date   COLONOSCOPY WITH PROPOFOL  2017   CYSTOSCOPY/RETROGRADE/URETEROSCOPY/STONE EXTRACTION WITH BASKET Left 04/16/2018   Procedure: CYSTOSCOPY/RETROGRADE/URETEROSCOPY/STONE EXTRACTION WITH BASKET/ HOLMIUM LASER LITHOTRIPSY/ STENT PLACEMENT;  Surgeon: Ardis Hughs, MD;  Location: [facility];  Service: Urology;  Laterality: Left;   HERNIA REPAIR Right 2022   inguinal   HOLMIUM LASER APPLICATION Left 00/93/8182   Procedure: HOLMIUM LASER APPLICATION;  Surgeon: Ardis Hughs, MD;  Location: [facility];  Service: Urology;  Laterality: Left;   TOTAL HIP ARTHROPLASTY Right 06/30/2022   Procedure: RIGHT TOTAL HIP ARTHROPLASTY ANTERIOR APPROACH;  Surgeon: Leandrew Koyanagi, MD;  Location: [place];  Service: Orthopedics;  Laterality: Right;  3-C    HEMATOLOGY/ONCOLOGY HISTORY:  Oncology History   No history exists.    ALLERGIES:  is allergic to statins.  MEDICATIONS:  Current Outpatient Medications  Medication Sig Dispense Refill   fentaNYL (DURAGESIC) 12 MCG/HR Place 1 patch onto the skin every 3 (three) days. Apply in addition to 71mcg patch to total 30mcg. 10 patch 0   acetaminophen (TYLENOL) 650 MG CR tablet Take 1,300 mg by mouth in the morning and at bedtime.     Alirocumab (PRALUENT) 150 MG/ML SOAJ Inject 1 Pen into the skin every 14 (fourteen) days. (Patient taking differently: Inject 1 Pen into the skin every  14 (fourteen) days. Every other Sunday) 2 mL 11   allopurinol (ZYLOPRIM) 300 MG tablet Take 300 mg by mouth daily.     amLODipine (NORVASC) 5 MG tablet Take 1 tablet (5 mg total) by mouth daily. (Patient taking differently: Take 5 mg by mouth every evening.) 190 tablet 3   diclofenac Sodium (VOLTAREN) 1 % GEL Apply  2 g topically 3 (three) times daily as needed for up to 7 days. 50 g 0   [START ON 11/04/2022] fentaNYL (DURAGESIC) 25 MCG/HR Place 1 patch onto the skin every 3 (three) days. 5 patch 0   gabapentin (NEURONTIN) 300 MG capsule Take 900 mg by mouth at bedtime.     HYDROmorphone (DILAUDID) 2 MG tablet Take 1-2 tablets (2-4 mg total) by mouth every 4 (four) hours as needed for severe pain. 2 mg PO Q4 hr prn pain 1-5/10;4 mg p q4 hr prn pain 6-10/10 90 tablet 0   ibuprofen (ADVIL) 200 MG tablet Take 600 mg by mouth every 8 (eight) hours as needed for moderate pain.     methocarbamol (ROBAXIN) 750 MG tablet Take 1 tablet (750 mg total) by mouth every 8 (eight) hours as needed for muscle spasms. 60 tablet 1   minoxidil (LONITEN) 10 MG tablet Take 1-1.5 tablets (10-15 mg total) by mouth 2 (two) times daily. TAKE 1 TABLET IN THE MORNING AND TAKE 1/2 TABLET AT NIGHT (Patient taking differently: Take 10 mg by mouth 2 (two) times daily.) 135 tablet 1   ondansetron (ZOFRAN-ODT) 4 MG disintegrating tablet Take 1 tablet (4 mg total) by mouth every 4 (four) hours as needed for nausea or vomiting. 20 tablet 0   pantoprazole (PROTONIX) 40 MG tablet TAKE 1 TABLET BY MOUTH TWICE A DAY (Patient taking differently: Take 40 mg by mouth 2 (two) times daily.) 180 tablet 1   polyethylene glycol (MIRALAX / GLYCOLAX) 17 g packet Take 17 g by mouth 2 (two) times daily. 14 each 0   PRESCRIPTION MEDICATION Apply 1 Application topically at bedtime as needed (pain). Compounded pain cream ([facility])     senna (SENOKOT) 8.6 MG TABS tablet Take 2 tablets (17.2 mg total) by mouth 2 (two) times daily. 120 tablet 0   valsartan (DIOVAN) 320 MG tablet TAKE 1 TABLET BY MOUTH EVERY DAY (Patient taking differently: Take 320 mg by mouth daily.) 90 tablet 3   No current facility-administered medications for this visit.   Facility-Administered Medications Ordered in Other Visits  Medication Dose Route Frequency Provider Last Rate  Last Admin   ketorolac (TORADOL) 30 MG/ML injection 30 mg  30 mg Intravenous Once Pickenpack-Cousar, Yaire Kreher N, NP        VITAL SIGNS: BP (!) 165/64 (BP Location: Left Arm, Patient Position: Sitting)   Pulse 93   Temp 98.6 F (37 C) (Oral)   Resp 16   Wt 196 lb 6 oz (89.1 kg)   SpO2 99%   BMI 27.39 kg/m  Filed Weights   11/03/22 1417  Weight: 196 lb 6 oz (89.1 kg)    Estimated body mass index is 27.39 kg/m as calculated from the following:   Height as of 10/27/22: 5\' 11"  (1.803 m).   Weight as of this encounter: 196 lb 6 oz (89.1 kg).  LABS: CBC:    Component Value Date/Time   WBC 9.6 10/30/2022 0616   HGB 12.8 (L) 10/30/2022 0616   HCT 42.6 10/30/2022 0616   PLT 311 10/30/2022 0616   MCV 85.5 10/30/2022 0616  NEUTROABS 8.7 (H) 10/27/2022 1002   LYMPHSABS 1.2 10/27/2022 1002   MONOABS 0.5 10/27/2022 1002   EOSABS 0.1 10/27/2022 1002   BASOSABS 0.0 10/27/2022 1002   Comprehensive Metabolic Panel:    Component Value Date/Time   NA 139 10/29/2022 0944   NA 141 10/29/2022 0944   NA 140 06/07/2021 0952   K 4.1 10/29/2022 0944   K 4.2 10/29/2022 0944   CL 103 10/29/2022 0944   CL 107 10/29/2022 0944   CO2 26 10/29/2022 0944   CO2 25 10/29/2022 0944   BUN 16 10/29/2022 0944   BUN 16 10/29/2022 0944   BUN 17 06/07/2021 0952   CREATININE 1.05 10/29/2022 0944   CREATININE 1.00 10/29/2022 0944   CREATININE 0.92 11/14/2016 1652   GLUCOSE 111 (H) 10/29/2022 0944   GLUCOSE 110 (H) 10/29/2022 0944   CALCIUM 8.6 (L) 10/29/2022 0944   CALCIUM 8.8 (L) 10/29/2022 0944   AST 17 10/29/2022 0944   ALT 17 10/29/2022 0944   ALKPHOS 89 10/29/2022 0944   BILITOT 0.5 10/29/2022 0944   PROT 6.1 (L) 10/29/2022 0944   ALBUMIN 3.6 10/29/2022 0944    RADIOGRAPHIC STUDIES: MR BRAIN WO CONTRAST  Result Date: 10/29/2022 CLINICAL DATA:  Non-small-cell lung cancer, staging EXAM: MRI HEAD WITHOUT CONTRAST TECHNIQUE: Multiplanar, multiecho pulse sequences of the brain and surrounding  structures were obtained without intravenous contrast. COMPARISON:  MRI head 03/22/2020 FINDINGS: Brain: No evidence of metastatic disease on unenhanced imaging. Note that intravenous contrast significantly increases sensitivity for metastatic disease. Ventricle size and cerebral volume normal. Negative for acute infarct or hemorrhage. Scattered T2 and FLAIR hyperintensities in the white matter similar to the prior study and most likely chronic microvascular ischemia. Vascular: Normal arterial flow voids Skull and upper cervical spine: No focal lesion. Sinuses/Orbits: Mild mucosal edema paranasal sinuses. Negative orbit Other: None IMPRESSION: 1. Negative for metastatic disease on unenhanced imaging. 2. Mild chronic microvascular ischemic change in the white matter. Electronically Signed   By: Franchot Gallo M.D.   On: 10/29/2022 11:49   CT Angio Chest/Abd/Pel for Dissection W and/or W/WO  Result Date: 10/28/2022 CLINICAL DATA:  Aortic aneurysm suspected or dissection. EXAM: CT ANGIOGRAPHY CHEST, ABDOMEN AND PELVIS TECHNIQUE: Non-contrast CT of the chest was initially obtained. Multidetector CT imaging through the chest, abdomen and pelvis was performed using the standard protocol during bolus administration of intravenous contrast. Multiplanar reconstructed images and MIPs were obtained and reviewed to evaluate the vascular anatomy. RADIATION DOSE REDUCTION: This exam was performed according to the departmental dose-optimization program which includes automated exposure control, adjustment of the mA and/or kV according to patient size and/or use of iterative reconstruction technique. CONTRAST:  66mL OMNIPAQUE IOHEXOL 350 MG/ML SOLN COMPARISON:  CTA chest and subsequent abdomen and pelvis CT yesterday at 6:50 p.m., CT abdomen and pelvis with IV contrast 01/14/2021. FINDINGS: CTA CHEST FINDINGS Cardiovascular: The heart is slightly enlarged. There is a small anterior pericardial effusion. There are  calcifications in the distal left main, proximal LAD and right coronary arteries. The pulmonary arteries and veins are normal in caliber except for again noted encasement and narrowing of the right upper lobe pulmonary artery by adenopathy. The pulmonary arteries are centrally clear. There is scattered aortic mixed plaque, scattered calcific plaques in the great vessels without stenosis, aneurysm or dissection. Mediastinum/Nodes: Right paratracheal nodal mass measures 4.5 x 3.3 cm on 7:49. A portion of this encases the right upper lobe pulmonary artery with narrowing. A prevascular lymph node right in front  of the SVC measures 1.5 x 2.2 cm on 7:51. There is a right hilar lymph node just above the distal right main pulmonary artery measuring 2.4 cm in short axis on 7:61. There is a posterior right hilar node measuring 1.5 cm in short axis on 7:64 and a right mid hilar node of 1 cm in short axis on 7:77. Additional prevascular space node to the right is 2.3 x 1.8 cm on 7:37. No thyroid or axillary mass is seen. The trachea is clear. The thoracic esophagus unremarkable. Lungs/Pleura: Small layering right pleural effusion possibly malignant. Nodular pleural thickening measuring 3.3 x 1.4 cm is noted posterior to the right hemidiaphragm on 7:117 most likely indicating pleural metastasis. Tumor extension into the chest wall is suspected through the right eighth intercostal space posteriorly and there is a rind of pleural thickening in this area as well (see series 7 axial images 93-101). There is a right upper lobe pleural-based pulmonary nodule anteromedially on 6:22 measuring 1.7 x 1.3 cm with small lobulations most likely a neoplasm and possibly the primary neoplasm. In the anterior basal segment of the right lower lobe there is a pleural-based nodule measuring 8 x 6 mm on 6:43. An additional pleural nodular focus suspected on 7:51 in the posterior right upper lung field. There is patchy ground-glass opacity  posteriorly in the right upper lobe which could be due to pneumonitis or lymphangitic carcinomatosis. No other focal infiltrate or further nodules are seen. No left pleural effusion. Musculoskeletal: There is erosion in the anterior aspect of the posterior right ninth rib where the pleural thickening extends through the eighth intercostal space consistent with early metastatic rib destruction. No other focal bone lesion is seen. There is no pathologic fracture. Review of the MIP images confirms the above findings. CTA ABDOMEN AND PELVIS FINDINGS VASCULAR Aorta: There is heavy mixed plaque. The aortic lumen is narrowed to 1 cm focally in the proximal infrarenal segment, otherwise does not show flow limiting stenosis. In the mid infrarenal aorta there is a small saccular aneurysm along the left wall measuring 9 mm at the base and 5 mm in height. There is no dissection. Celiac: There is 75% focal stenosis 1 cm distal to the vessel origin due to compression by the median arcuate ligament of the diaphragm. The vessel otherwise opacifies well. SMA: No flow-limiting stenosis, aneurysm or dissection. Renals: Single left renal artery is widely patent. There are 2 right renal arteries, the larger of which supplies the upper to midpole, the smaller of which arises anterior to the larger artery and supplies the inferior pole. Both are widely patent. IMA: No flow-limiting stenosis. Inflow: There is mixed plaque in the common iliac arteries with up to 50% stenosis on the left, no stenosis on the right. The internal iliac arteries show patchy calcific plaques without stenosis. Both external iliac arteries are widely patent. Proximal outflow arteries: Small amount of nonstenosing calcification in the right common femoral artery. No other significant findings of the proximal outflow vessels. Veins: Patent portal vein.  Other veins are unopacified. Review of the MIP images confirms the above findings. NON-VASCULAR Hepatobiliary: The  liver is 21 cm length with mild steatosis. There is a 1.2 cm cyst in the right lobe laterally. Subcentimeter hypodensity more inferiorly in the right lobe is too small to characterize but was present on both prior studies. No mass enhancement is seen. Gallbladder is distended, otherwise unremarkable no biliary dilatation. Pancreas: No abnormality. Spleen: No abnormality. Adrenals/Urinary Tract: There is no adrenal  mass. There are small bilateral renal cysts and a few nonobstructive caliceal stones in the kidneys. Additional subcentimeter renal hypodensities are too small characterize but no follow-up is recommended. There is no hydronephrosis or obstructing stone. No bladder thickening is seen. Stomach/Bowel: Unremarkable stomach and small bowel, normal appendix. Moderate stool retention ascending colon. Left colonic diverticulosis without evidence of diverticulitis. Lymphatic: No appreciable adenopathy. Reproductive: Mildly enlarged but poorly visualized prostate due to artifact from right hip replacement. Other: Right lower quadrant surgical clips. No free air, hemorrhage or free fluid. Small umbilical fat hernia. Musculoskeletal: Again noted is a destructive lesion of the medial right iliac crest measuring 5.6 x 5.1 cm on 7:235. There is ankylosis across the SI joints. Degenerative changes lumbar spine. Posterior endplate Schmorl's nodes L2-3. No other visible structure bone lesion. Review of the MIP images confirms the above findings. IMPRESSION: 1. Aortic and coronary artery atherosclerosis 2. Small anterior pericardial effusion.  Mild cardiomegaly. 3. No findings of acute aortic syndrome. 4. 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 5. 1.7 x 1.3 cm medial right upper lobe pleural-based nodule most likely a neoplasm and possibly the primary neoplasm. 6. Posterior segment right upper lobe ground-glass opacities consistent  with pneumonitis or lymphangitic carcinomatosis. 7. Right hilar and mediastinal adenopathy with a 4.5 x 3.3 cm right paratracheal nodal mass encasing and narrowing the right upper lobe pulmonary artery. 8. 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 9. Metastatic erosion of the anterior aspect of the posterior right ninth rib, and a 5.6 x 5.1 cm destructive lesion of the medial right iliac crest. 10. Nonobstructive nephrolithiasis. 11. Constipation and diverticulosis. 12. 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 13. Additional findings described above. Aortic Atherosclerosis (ICD10-I70.0). Electronically Signed   By: Telford Nab M.D.   On: 10/28/2022 06:37   US Abdomen Limited RUQ (LIVER/GB) Result Date: 10/27/2022 CLINICAL DATA:  RIGHT upper quadrant pain EXAM: ULTRASOUND ABDOMEN LIMITED RIGHT UPPER QUADRANT COMPARISON:  February 2022 FINDINGS: Gallbladder: No gallstones or wall thickening visualized. No sonographic Murphy sign noted by sonographer. Common bile duct: Diameter: 5.5 mm, without intrahepatic biliary duct distension. Liver: Increased hepatic echogenicity without visible lesion on submitted images. Study limited by body habitus. Portal vein is patent on color Doppler imaging with normal direction of blood flow towards the liver. Other:  no ascites IMPRESSION: Hepatic steatosis without acute findings. Electronically Signed   By: Zetta Bills M.D.   On: 10/27/2022 10:34    PERFORMANCE STATUS (ECOG) : 1 - Symptomatic but completely ambulatory  Review of Systems  Constitutional:  Positive for appetite change and fatigue.  Musculoskeletal:  Positive for arthralgias and back pain.  Unless otherwise noted, a complete review of systems is negative.  Physical  Exam General: NAD Cardiovascular: regular rate and rhythm Pulmonary: clear ant fields, normal breathing pattern  Abdomen: soft, nontender, + bowel sounds Extremities: no edema, no joint deformities Skin: no rashes Neurological:AAO x3, mood appropriate   IMPRESSION: This is Thomas [name] initial visit at the cancer center. He was initially seen by our Palliative team during recent admission. His wife and daughter are present during visit. No acute distress. Does endorse ongoing pain. Ambulatory without devices. Alert and able to engage appropriately  in discussions.   I introduced myself, [place] RN, and Palliative's role in collaboration with the oncology team. Concept of Palliative Care was introduced as specialized medical care for people and their families living with serious illness.  It focuses on providing relief from the symptoms and stress of a serious illness.  The goal is to improve quality of life for both the patient and the family. Values and goals of care important to patient and family were attempted to be elicited.   Thomas [name] lives in the home with his wife of more than 30 years. They have 2 children. 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 He enjoys music, being outside, gardening, and spending time with his family.   He is able to perform ADLs independently. Does required some breaks due to ongoing pain. He is not able to do some of the things he once enjoyed due to his pain.    We discussed his current illness and what it means in the larger context of his on-going co-morbidities. Natural disease trajectory and expectations were discussed.  Thomas [name] and family are realistic in their understanding. They are anxiously awaiting next steps once biopsy/pathology reports are completed. He is remaining hopeful for stability and improvement in his quality of life. Clear in expressed wishes to treat the  treatable aggressively allowing him every opportunity to thrive.   Neoplasm related pain Thomas [name] expresses his concern for ongoing pain. He was initially seen during recent hospitalization by my colleague Dr. Rowe Pavy. At that time he was started on hydromorphone 2-4mg  every 4 hours as needed and fentanyl 76mcg patch. Patient reports tolerating well however pain escalates within 3-4 hours.   We discussed at length goals for his pain. He rates worst pain 9/10. Pain does decreased to 6-7/10 at times. He would be most comfortable and able to manage at 4/10. We discussed ways to make that happen with awareness we may have to make adjustments along the way. He and family verbalized understanding and appreciation.   We will continue with hydromorphone 2-4 mg every 4 hours as needed. Education provided on use of fentanyl patch for long-acting management. We will increase dose to 31mcg (adding 37mcg patch) currently wearing 25mg  patch. He start new patch tomorrow to align with same day changes every 3 days. Extensive education on potential side effects and when to seek care.   We will continue to closely monitor and support. Making adjustments as needed.   Constipation  Denies concerns with constipation. Does have Miralax on hand. Education provided on use of bowel regimen in the setting of opioid use.   I discussed the importance of continued conversation with family and their medical providers regarding overall plan of care and treatment options, ensuring decisions are within the context of the patients values and GOCs.  PLAN: Established therapeutic relationship. Education provided on palliative's role in collaboration with their Oncology/Radiation team. Fentanyl 37 mcg patch (25 mcg / 12 mcg) Hydromorphone 2-4 mg every 4 hours as needed for breakthrough pain. MiraLAX for bowel regimen I will plan to see patient back in 2-4 weeks in collaboration to other oncology appointments.  We will plan to follow-up  by phone within the next week for close evaluation of pain and other symptoms.   Patient expressed understanding and was in agreement with this plan. He also understands that He can call the clinic at any time with any questions, concerns, or  complaints.   Thank you for your referral and allowing Palliative to assist in Mr.  Thomas [name]'s care.   Number and complexity of problems addressed: HIGH - 1 or more chronic illnesses with SEVERE exacerbation, progression, or side effects of treatment - advanced cancer, pain. Any controlled substances utilized were prescribed in the context of palliative care.  Time Total: 55 min   Visit consisted of counseling and education dealing with the complex and emotionally intense issues of symptom management and palliative care in the setting of serious and potentially life-threatening illness.Greater than 50%  of this time was spent counseling and coordinating care related to the above assessment and plan.  Signed by: Alda Lea, AGPCNP-BC Palliative Medicine Team/[place]

## 2022-11-04 ENCOUNTER — Other Ambulatory Visit: Payer: Medicare HMO

## 2022-11-04 ENCOUNTER — Ambulatory Visit
Admission: RE | Admit: 2022-11-04 | Discharge: 2022-11-04 | Disposition: A | Discharge status: Home / Self Care | Payer: Medicare HMO | Source: Ambulatory Visit | Attending: Radiation Oncology | Admitting: Radiation Oncology

## 2022-11-04 ENCOUNTER — Other Ambulatory Visit: Payer: Self-pay

## 2022-11-04 DIAGNOSIS — C7951 Secondary malignant neoplasm of bone: Secondary | ICD-10-CM | POA: Diagnosis not present

## 2022-11-04 DIAGNOSIS — C3411 Malignant neoplasm of upper lobe, right bronchus or lung: Secondary | ICD-10-CM | POA: Diagnosis not present

## 2022-11-04 DIAGNOSIS — Z510 Encounter for antineoplastic radiation therapy: Secondary | ICD-10-CM | POA: Diagnosis not present

## 2022-11-04 DIAGNOSIS — Z87891 Personal history of nicotine dependence: Secondary | ICD-10-CM | POA: Diagnosis not present

## 2022-11-04 DIAGNOSIS — C3431 Malignant neoplasm of lower lobe, right bronchus or lung: Secondary | ICD-10-CM | POA: Diagnosis not present

## 2022-11-04 LAB — RAD ONC ARIA SESSION SUMMARY

## 2022-11-04 NOTE — Telephone Encounter (Signed)
Called patient following his most recent message.  He is in good hands with Dr. Julien Nordmann and the care team.  Advised to call or message me if I can help in any way.

## 2022-11-05 ENCOUNTER — Other Ambulatory Visit: Payer: Self-pay | Admitting: Internal Medicine

## 2022-11-05 ENCOUNTER — Ambulatory Visit
Admission: RE | Admit: 2022-11-05 | Discharge: 2022-11-05 | Disposition: A | Discharge status: Home / Self Care | Payer: Medicare HMO | Source: Ambulatory Visit | Attending: Radiation Oncology | Admitting: Radiation Oncology

## 2022-11-05 ENCOUNTER — Other Ambulatory Visit: Payer: Self-pay

## 2022-11-05 ENCOUNTER — Inpatient Hospital Stay: Payer: Medicare HMO

## 2022-11-05 DIAGNOSIS — C3411 Malignant neoplasm of upper lobe, right bronchus or lung: Secondary | ICD-10-CM | POA: Diagnosis not present

## 2022-11-05 DIAGNOSIS — I119 Hypertensive heart disease without heart failure: Secondary | ICD-10-CM | POA: Diagnosis not present

## 2022-11-05 DIAGNOSIS — K551 Chronic vascular disorders of intestine: Secondary | ICD-10-CM | POA: Diagnosis not present

## 2022-11-05 DIAGNOSIS — C7951 Secondary malignant neoplasm of bone: Secondary | ICD-10-CM | POA: Diagnosis not present

## 2022-11-05 DIAGNOSIS — R7303 Prediabetes: Secondary | ICD-10-CM | POA: Diagnosis not present

## 2022-11-05 DIAGNOSIS — C3431 Malignant neoplasm of lower lobe, right bronchus or lung: Secondary | ICD-10-CM

## 2022-11-05 DIAGNOSIS — Z510 Encounter for antineoplastic radiation therapy: Secondary | ICD-10-CM | POA: Diagnosis not present

## 2022-11-05 DIAGNOSIS — G8929 Other chronic pain: Secondary | ICD-10-CM | POA: Diagnosis not present

## 2022-11-05 DIAGNOSIS — C3490 Malignant neoplasm of unspecified part of unspecified bronchus or lung: Secondary | ICD-10-CM | POA: Diagnosis not present

## 2022-11-05 DIAGNOSIS — Z87891 Personal history of nicotine dependence: Secondary | ICD-10-CM | POA: Diagnosis not present

## 2022-11-05 DIAGNOSIS — M899 Disorder of bone, unspecified: Secondary | ICD-10-CM | POA: Diagnosis not present

## 2022-11-05 LAB — RAD ONC ARIA SESSION SUMMARY

## 2022-11-05 LAB — SURGICAL PATHOLOGY

## 2022-11-06 ENCOUNTER — Other Ambulatory Visit: Payer: Self-pay

## 2022-11-06 ENCOUNTER — Inpatient Hospital Stay (HOSPITAL_BASED_OUTPATIENT_CLINIC_OR_DEPARTMENT_OTHER): Payer: Medicare HMO | Admitting: Nurse Practitioner

## 2022-11-06 ENCOUNTER — Encounter: Payer: Self-pay | Admitting: Nurse Practitioner

## 2022-11-06 ENCOUNTER — Ambulatory Visit
Admission: RE | Admit: 2022-11-06 | Discharge: 2022-11-06 | Disposition: A | Discharge status: Home / Self Care | Payer: Medicare HMO | Source: Ambulatory Visit | Attending: Radiation Oncology | Admitting: Radiation Oncology

## 2022-11-06 DIAGNOSIS — Z515 Encounter for palliative care: Secondary | ICD-10-CM

## 2022-11-06 DIAGNOSIS — R530 Neoplastic (malignant) related fatigue: Secondary | ICD-10-CM

## 2022-11-06 DIAGNOSIS — Z87891 Personal history of nicotine dependence: Secondary | ICD-10-CM | POA: Diagnosis not present

## 2022-11-06 DIAGNOSIS — C3411 Malignant neoplasm of upper lobe, right bronchus or lung: Secondary | ICD-10-CM | POA: Diagnosis not present

## 2022-11-06 DIAGNOSIS — C3431 Malignant neoplasm of lower lobe, right bronchus or lung: Secondary | ICD-10-CM | POA: Diagnosis not present

## 2022-11-06 DIAGNOSIS — C7951 Secondary malignant neoplasm of bone: Secondary | ICD-10-CM | POA: Diagnosis not present

## 2022-11-06 DIAGNOSIS — M792 Neuralgia and neuritis, unspecified: Secondary | ICD-10-CM | POA: Diagnosis not present

## 2022-11-06 DIAGNOSIS — C3490 Malignant neoplasm of unspecified part of unspecified bronchus or lung: Secondary | ICD-10-CM | POA: Diagnosis not present

## 2022-11-06 DIAGNOSIS — G893 Neoplasm related pain (acute) (chronic): Secondary | ICD-10-CM | POA: Diagnosis not present

## 2022-11-06 DIAGNOSIS — M898X5 Other specified disorders of bone, thigh: Secondary | ICD-10-CM | POA: Diagnosis not present

## 2022-11-06 DIAGNOSIS — Z510 Encounter for antineoplastic radiation therapy: Secondary | ICD-10-CM | POA: Diagnosis not present

## 2022-11-06 LAB — RAD ONC ARIA SESSION SUMMARY

## 2022-11-06 MED ORDER — HYDROMORPHONE HCL 4 MG PO TABS: 4.0000 mg | ORAL_TABLET | ORAL | 0 refills | Status: DC | PRN

## 2022-11-06 MED ORDER — HYDROMORPHONE HCL 2 MG PO TABS: 2.0000 mg | ORAL_TABLET | ORAL | 0 refills | Status: DC | PRN

## 2022-11-06 NOTE — Progress Notes (Signed)
[name]  [phone] [phone]   Name: Thomas [name] Date: 11/06/2022 MRN: [id number]  DOB: 1951/04/02  Patient Care Team: Jimmy Footman, NP as PCP - General (Nurse Practitioner) Park Liter, MD as PCP - Cardiology (Cardiology) Pickenpack-Cousar, Carlena Sax, NP as Nurse Practitioner (Nurse Practitioner)   I connected with Thomas [name] on 11/06/22 at 12:30 PM EST by Phone and verified that I am speaking with the correct person using two identifiers.   I discussed the limitations, risks, security and privacy concerns of performing an evaluation and management service by telemedicine and the availability of in-person appointments. I also discussed with the patient that there may be a patient responsible charge related to this service. The patient expressed understanding and agreed to proceed.   Other persons participating in the visit and their role in the encounter: [place], RN, patient's wife and daughter   Patient's location: Home   Provider's location: [place]    Chief Complaint: Symptom Management Follow-Up   INTERVAL HISTORY: Thomas [name] is a 71 y.o. male with oncologic medical history including right iliac lytic lesion, right upper lob nodule (pending pathology), right paratracheal nodule, and right hilar and mediastinal adenopathy. He is currently undergoing radiation to right iliac and right rib. Pending additional oncology interventions based on pathology report.  Palliative ask to see for symptom management and goals of care.   SOCIAL HISTORY:     reports that he quit smoking about 28 years ago. His smoking use included cigarettes. He has a 6.00 pack-year smoking history. He has never used smokeless tobacco. He reports that he does not currently use alcohol. He reports that he does not use drugs.  ADVANCE DIRECTIVES:    CODE STATUS:   PAST MEDICAL HISTORY: Past Medical  History:  Diagnosis Date   Abdominal wall pain 04/02/2021   Acute ischemic stroke ([place]) 03/22/2020   Avulsed toenail, initial encounter 05/04/2018   Dyspnea on exertion 02/10/2020   ED (erectile dysfunction)    Epidermoid cyst of neck 10/16/2017   Essential hypertension 07/21/2017   GERD (gastroesophageal reflux disease)    Gout of multiple sites 07/21/2017   History of kidney stones    History of squamous cell carcinoma excision    Hyperlipidemia    Hypertension not at goal 01/19/2014   It band syndrome, left 03/19/2020   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/24/2020   Left ureteral calculus    Lower abdominal pain 08/27/2015   Metatarsalgia of [name] feet 03/19/2020   Multiple joint pain 07/16/2018   Pain in joint, shoulder region 12/11/2015   Right flank pain 04/02/2021   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  MEDICATIONS:  Current Outpatient Medications  Medication Sig Dispense Refill   acetaminophen (TYLENOL) 650 MG CR tablet Take 1,300 mg by mouth in the morning and at bedtime.     Alirocumab (PRALUENT) 150 MG/ML SOAJ Inject 1 Pen into the skin every 14 (fourteen) days. (Patient taking differently: Inject 1 Pen into the skin every 14 (fourteen) days. Every other Sunday) 2 mL 11   allopurinol (ZYLOPRIM) 300 MG tablet Take 300 mg by mouth daily.     amLODipine (NORVASC) 5 MG tablet Take 1 tablet (5 mg total) by mouth daily. (Patient taking differently: Take 5 mg by  mouth every evening.) 190 tablet 3   diclofenac Sodium (VOLTAREN) 1 % GEL Apply 2 g topically 3 (three) times daily as needed for up to 7 days. 50 g 0   fentaNYL (DURAGESIC) 12 MCG/HR Place 1 patch onto the skin every 3 (three) days. Apply in addition to 89mcg patch to total 36mcg. 10 patch 0   fentaNYL (DURAGESIC) 25 MCG/HR Place 1 patch onto the skin every 3  (three) days. 5 patch 0   gabapentin (NEURONTIN) 300 MG capsule Take 900 mg by mouth at bedtime.     HYDROmorphone (DILAUDID) 2 MG tablet Take 1-2 tablets (2-4 mg total) by mouth every 4 (four) hours as needed for severe pain. 2 mg PO Q4 hr prn pain 1-5/10;4 mg p q4 hr prn pain 6-10/10 90 tablet 0   ibuprofen (ADVIL) 200 MG tablet Take 600 mg by mouth every 8 (eight) hours as needed for moderate pain.     methocarbamol (ROBAXIN) 750 MG tablet Take 1 tablet (750 mg total) by mouth every 8 (eight) hours as needed for muscle spasms. 60 tablet 1   minoxidil (LONITEN) 10 MG tablet Take 1-1.5 tablets (10-15 mg total) by mouth 2 (two) times daily. TAKE 1 TABLET IN THE MORNING AND TAKE 1/2 TABLET AT NIGHT (Patient taking differently: Take 10 mg by mouth 2 (two) times daily.) 135 tablet 1   ondansetron (ZOFRAN-ODT) 4 MG disintegrating tablet Take 1 tablet (4 mg total) by mouth every 4 (four) hours as needed for nausea or vomiting. 20 tablet 0   pantoprazole (PROTONIX) 40 MG tablet TAKE 1 TABLET BY MOUTH TWICE A DAY (Patient taking differently: Take 40 mg by mouth 2 (two) times daily.) 180 tablet 1   polyethylene glycol (MIRALAX / GLYCOLAX) 17 g packet Take 17 g by mouth 2 (two) times daily. 14 each 0   PRESCRIPTION MEDICATION Apply 1 Application topically at bedtime as needed (pain). Compounded pain cream ([facility])     senna (SENOKOT) 8.6 MG TABS tablet Take 2 tablets (17.2 mg total) by mouth 2 (two) times daily. 120 tablet 0   valsartan (DIOVAN) 320 MG tablet TAKE 1 TABLET BY MOUTH EVERY DAY (Patient taking differently: Take 320 mg by mouth daily.) 90 tablet 3   No current facility-administered medications for this visit.    VITAL SIGNS: There were no vitals taken for this visit. There were no vitals filed for this visit.  Estimated body mass index is 27.39 kg/m as calculated from the following:   Height as of 10/27/22: 5\' 11"  (1.803 m).   Weight as of 11/03/22: 196 lb 6 oz (89.1  kg).   PERFORMANCE STATUS (ECOG) : 1 - Symptomatic but completely ambulatory    IMPRESSION: I spoke with Mr. Levene by phone for symptom management follow-up. He is trying to remain as active as possible unfortunately his ongoing pain limits his ability to do so. Endorses some new fatigue. Discussed potential causes for fatigue. Appetite is fair.   Neoplasm related pain  Jonn expresses minimal improvement in his pain despite recent increase in fentanyl patch. Current regimen prior to discharge seemed efficient however we will continue to adjust as needed. He did have some decreased rib pain after dose of IM Toradol. We will repeat this on Friday prior to radiation.   We discussed at length current regimen. Mr. Knaggs rates his pain 7/10.  He had previous hydrocodone available which he tried as directed but unfortunately pain increased within 2 hours.  Reports pain was  intense after his radiation for several hours on yesterday.  He is realistic in his understanding that we may not be able to completely resolve his pain but with a goal of getting him to a comfortable and productive level which will be around a 3-4/10.  I instructed patient to increase his hydromorphone to 4-6 mg every 4 hours as needed for breakthrough pain.  Continue fentanyl patch 37 mcg.  We will continue to closely monitor and make adjustments as needed.  If pain continues to be uncontrolled we will plan to increase fentanyl patch to 50 mcg.  Education provided on the use of Celebrex which can help with pain associated with inflammation.  Wise states he has used in the past with minimal to no relief.  Feels he gets better response from Advil.  He is taking 900 mg of gabapentin at bedtime.  States he previously attempted to take during the daytime however it made him drowsy.  He is continue to try to work from home as does not wish to take the daytime dose as this will prevent him from working.  Will continue to closely monitor  and adjust medications accordingly.  May need to strongly consider adding daytime gabapentin dosing.  Given patient is able to tolerate high doses of hydromorphone during the day hopefully daytime gabapentin would not contribute to his drowsiness compared to other pain medications.    Constipation  MiraLAX and senna daily for constipation in the setting of opioid use.  I discussed the importance of continued conversation with family and their medical providers regarding overall plan of care and treatment options, ensuring decisions are within the context of the patients values and GOCs.  PLAN: Fentanyl 37 mcg patch.  Will consider increasing dose if pain continues to be uncontrolled. Hydromorphone 4 to 6 mg as needed for breakthrough pain Gabapentin 900 mg at bedtime MiraLAX/senna daily for bowel regimen Toradol 30 mg IM on Friday prior to radiation. We will continue to closely monitor and adjust medications as needed.   Patient expressed understanding and was in agreement with this plan. He also understands that He can call the clinic at any time with any questions, concerns, or complaints.   Any controlled substances utilized were prescribed in the context of palliative care. PDMP has been reviewed.   Time Total: 40 min   Visit consisted of counseling and education dealing with the complex and emotionally intense issues of symptom management and palliative care in the setting of serious and potentially life-threatening illness.Greater than 50%  of this time was spent counseling and coordinating care related to the above assessment and plan.  Alda Lea, AGPCNP-BC  Palliative Medicine Team/[facility]

## 2022-11-07 ENCOUNTER — Other Ambulatory Visit: Payer: Self-pay

## 2022-11-07 ENCOUNTER — Inpatient Hospital Stay: Payer: Medicare HMO | Admitting: Nurse Practitioner

## 2022-11-07 ENCOUNTER — Ambulatory Visit
Admission: RE | Admit: 2022-11-07 | Discharge: 2022-11-07 | Disposition: A | Discharge status: Home / Self Care | Payer: Medicare HMO | Source: Ambulatory Visit | Attending: Radiation Oncology | Admitting: Radiation Oncology

## 2022-11-07 DIAGNOSIS — K760 Fatty (change of) liver, not elsewhere classified: Secondary | ICD-10-CM | POA: Diagnosis not present

## 2022-11-07 DIAGNOSIS — K5900 Constipation, unspecified: Secondary | ICD-10-CM | POA: Diagnosis not present

## 2022-11-07 DIAGNOSIS — C3431 Malignant neoplasm of lower lobe, right bronchus or lung: Secondary | ICD-10-CM | POA: Diagnosis not present

## 2022-11-07 DIAGNOSIS — C7951 Secondary malignant neoplasm of bone: Secondary | ICD-10-CM | POA: Diagnosis not present

## 2022-11-07 DIAGNOSIS — I6782 Cerebral ischemia: Secondary | ICD-10-CM | POA: Diagnosis not present

## 2022-11-07 DIAGNOSIS — K219 Gastro-esophageal reflux disease without esophagitis: Secondary | ICD-10-CM | POA: Diagnosis not present

## 2022-11-07 DIAGNOSIS — E785 Hyperlipidemia, unspecified: Secondary | ICD-10-CM | POA: Diagnosis not present

## 2022-11-07 DIAGNOSIS — Z79899 Other long term (current) drug therapy: Secondary | ICD-10-CM | POA: Diagnosis not present

## 2022-11-07 DIAGNOSIS — M109 Gout, unspecified: Secondary | ICD-10-CM | POA: Diagnosis not present

## 2022-11-07 DIAGNOSIS — R918 Other nonspecific abnormal finding of lung field: Secondary | ICD-10-CM | POA: Diagnosis not present

## 2022-11-07 DIAGNOSIS — Z510 Encounter for antineoplastic radiation therapy: Secondary | ICD-10-CM | POA: Diagnosis not present

## 2022-11-07 DIAGNOSIS — K828 Other specified diseases of gallbladder: Secondary | ICD-10-CM | POA: Diagnosis not present

## 2022-11-07 DIAGNOSIS — K5730 Diverticulosis of large intestine without perforation or abscess without bleeding: Secondary | ICD-10-CM | POA: Diagnosis not present

## 2022-11-07 DIAGNOSIS — I10 Essential (primary) hypertension: Secondary | ICD-10-CM | POA: Diagnosis not present

## 2022-11-07 DIAGNOSIS — Z87442 Personal history of urinary calculi: Secondary | ICD-10-CM | POA: Diagnosis not present

## 2022-11-07 DIAGNOSIS — G893 Neoplasm related pain (acute) (chronic): Secondary | ICD-10-CM

## 2022-11-07 DIAGNOSIS — Z87891 Personal history of nicotine dependence: Secondary | ICD-10-CM | POA: Diagnosis not present

## 2022-11-07 DIAGNOSIS — C3490 Malignant neoplasm of unspecified part of unspecified bronchus or lung: Secondary | ICD-10-CM

## 2022-11-07 DIAGNOSIS — J90 Pleural effusion, not elsewhere classified: Secondary | ICD-10-CM | POA: Diagnosis not present

## 2022-11-07 DIAGNOSIS — M792 Neuralgia and neuritis, unspecified: Secondary | ICD-10-CM

## 2022-11-07 DIAGNOSIS — C3411 Malignant neoplasm of upper lobe, right bronchus or lung: Secondary | ICD-10-CM | POA: Diagnosis not present

## 2022-11-07 DIAGNOSIS — I3139 Other pericardial effusion (noninflammatory): Secondary | ICD-10-CM | POA: Diagnosis not present

## 2022-11-07 LAB — RAD ONC ARIA SESSION SUMMARY

## 2022-11-07 MED ORDER — KETOROLAC TROMETHAMINE 30 MG/ML IJ SOLN
30.0000 mg | Freq: Once | INTRAMUSCULAR | Status: AC
  Administered 2022-11-07: 30 mg via INTRAMUSCULAR
  Filled 2022-11-07: qty 1

## 2022-11-07 NOTE — Progress Notes (Signed)
Pt came in for toradol injection, administered in R deltoid muscle. Pt tolerated well, arrived to radiation, ambulatory to lobby no further needs at this point.

## 2022-11-07 NOTE — Patient Instructions (Signed)
Ketorolac Injection What is this medication? KETOROLAC (kee toe ROLE ak) treats short-term moderate to severe pain. It works by decreasing inflammation. It belongs to a group of medications called NSAIDs. This medicine may be used for other purposes; ask your health care provider or pharmacist if you have questions. COMMON BRAND NAME(S): Toradol What should I tell my care team before I take this medication? They need to know if you have any of these conditions: Bleeding disorder Coronary artery bypass graft (CABG) within the past 2 weeks Frequently drink alcohol Heart attack Heart disease Heart failure High blood pressure Kidney disease Liver disease Lung or breathing disease, such as asthma or COPD Receiving steroids, such as dexamethasone or prednisone Stomach bleeding Stomach ulcers, other stomach or intestine problems Take medication to treat or prevent blood clots Tobacco use An unusual or allergic reaction to ketorolac, other medications, foods, dyes, or preservatives Pregnant or trying to get pregnant Breastfeeding How should I use this medication? This medication is injected into a vein or muscle. It is given by your care team in a hospital or clinic setting. A special MedGuide will be given to you before each treatment. Be sure to read this information carefully each time. Talk to your care team about the use of this medication in children. Special care may be needed. People 65 years and older may have a stronger reaction and need a smaller dose. Overdosage: If you think you have taken too much of this medicine contact a poison control center or emergency room at once. NOTE: This medicine is only for you. Do not share this medicine with others. What if I miss a dose? This does not apply. This medication is not for regular use. What may interact with this medication? Do not take this medication with any of the following: Aspirin and aspirin-like  medications Cidofovir Methotrexate NSAIDs, medications for pain and inflammation, such as ibuprofen or naproxen Pentoxifylline Probenecid This medication may also interact with the following: Alcohol Alendronate Alprazolam Carbamazepine Diuretics Flavocoxid Fluoxetine Ginkgo Lithium Medications for blood pressure, such as enalapril Medications that affect platelets, such as pentoxifylline Medications that prevent or treat blood clots, such as heparin or warfarin Medications that relax muscles Pemetrexed Phenytoin Thiothixene This list may not describe all possible interactions. Give your health care provider a list of all the medicines, herbs, non-prescription drugs, or dietary supplements you use. Also tell them if you smoke, drink alcohol, or use illegal drugs. Some items may interact with your medicine. What should I watch for while using this medication? Your condition will be monitored carefully while you are receiving this medication. Do not use this medication for more than 5 days. It is only used for short-term treatment of moderate to severe pain. The risk of side effects such as kidney damage and stomach bleeding are higher if used for more than 5 days. Do not take other medications that contain aspirin, ibuprofen, or naproxen with this medication. Side effects such as stomach upset, nausea, or ulcers may be more likely to occur. Many non-prescription medications contain aspirin, ibuprofen, or naproxen. Always read labels carefully. This medication can cause serious ulcers and bleeding in the stomach. It can happen with no warning. Tobacco, alcohol, older age, and poor health can also increase risks. Call your care team right away if you have stomach pain or blood in your vomit or stool. This medication may cause serious skin reactions. They can happen weeks to months after starting the medication. Contact your care team right away  if you notice fevers or flu-like symptoms with  a rash. The rash may be red or purple and then turn into blisters or peeling of the skin. You may also notice a red rash with swelling of the face, lips, or lymph nodes in your neck or under your arms. Talk to your care team if you wish to become pregnant or think you might be pregnant. This medication can cause serious birth defects. This medication does not prevent a heart attack or stroke. This medication may increase the chance of a heart attack or stroke. The chance may increase the longer you use this medication or if you have heart disease. If you take aspirin to prevent a heart attack or stroke, talk to your care team about using this medication. This medication may affect your coordination, reaction time, or judgment. Do not drive or operate machinery until you know how this medication affects you. Sit up or stand slowly to reduce the risk of dizzy or fainting spells. Drinking alcohol with this medication can increase the risk of these side effects. What side effects may I notice from receiving this medication? Side effects that you should report to your care team as soon as possible: Allergic reactions--skin rash, itching, hives, swelling of the face, lips, tongue, or throat Heart attack--pain or tightness in the chest, shoulders, arms, or jaw, nausea, shortness of breath, cold or clammy skin, feeling faint or lightheaded Heart failure--shortness of breath, swelling of ankles, feet, or hands, sudden weight gain, unusual weakness or fatigue Increase in blood pressure Kidney injury--decrease in the amount of urine, swelling of the ankles, hands, or feet Liver injury--right upper belly pain, loss of appetite, nausea, light-colored stool, dark yellow or brown urine, yellowing skin or eyes, unusual weakness or fatigue Low red blood cell count--unusual weakness or fatigue, dizziness, headache, trouble breathing Rash, fever, and swollen lymph nodes Redness, blistering, peeling, or loosening of the  skin, including inside the mouth Stomach bleeding--bloody or black, tar-like stools, vomiting blood or brown material that looks like coffee grounds Stroke--sudden numbness or weakness of the face, arm, or leg, trouble speaking, confusion, trouble walking, loss of balance or coordination, dizziness, severe headache, change in vision Side effects that usually do not require medical attention (report to your care team if they continue or are bothersome): Constipation Diarrhea Dizziness Headache Nausea Stomach pain This list may not describe all possible side effects. Call your doctor for medical advice about side effects. You may report side effects to FDA at 1-800-FDA-1088. Where should I keep my medication? This medication is given in a hospital or clinic. It will not be stored at home. NOTE: This sheet is a summary. It may not cover all possible information. If you have questions about this medicine, talk to your doctor, pharmacist, or health care provider.  2023 [employer]/Gold Standard (2021-08-05 00:00:00)

## 2022-11-11 ENCOUNTER — Other Ambulatory Visit: Payer: Self-pay

## 2022-11-11 ENCOUNTER — Ambulatory Visit
Admission: RE | Admit: 2022-11-11 | Discharge: 2022-11-11 | Disposition: A | Discharge status: Home / Self Care | Payer: Medicare HMO | Source: Ambulatory Visit | Attending: Radiation Oncology | Admitting: Radiation Oncology

## 2022-11-11 ENCOUNTER — Telehealth: Payer: Self-pay

## 2022-11-11 DIAGNOSIS — C3431 Malignant neoplasm of lower lobe, right bronchus or lung: Secondary | ICD-10-CM | POA: Diagnosis not present

## 2022-11-11 DIAGNOSIS — C7951 Secondary malignant neoplasm of bone: Secondary | ICD-10-CM | POA: Diagnosis not present

## 2022-11-11 DIAGNOSIS — C3411 Malignant neoplasm of upper lobe, right bronchus or lung: Secondary | ICD-10-CM | POA: Diagnosis not present

## 2022-11-11 DIAGNOSIS — Z510 Encounter for antineoplastic radiation therapy: Secondary | ICD-10-CM | POA: Diagnosis not present

## 2022-11-11 DIAGNOSIS — Z87891 Personal history of nicotine dependence: Secondary | ICD-10-CM | POA: Diagnosis not present

## 2022-11-11 DIAGNOSIS — C3492 Malignant neoplasm of unspecified part of left bronchus or lung: Secondary | ICD-10-CM | POA: Diagnosis not present

## 2022-11-11 LAB — RAD ONC ARIA SESSION SUMMARY
Course Elapsed Days: 12
Plan Fractions Treated to Date: 8
Plan Fractions Treated to Date: 8
Plan Prescribed Dose Per Fraction: 3 Gy
Plan Prescribed Dose Per Fraction: 3 Gy
Plan Total Fractions Prescribed: 10
Plan Total Fractions Prescribed: 10
Plan Total Prescribed Dose: 30 Gy
Plan Total Prescribed Dose: 30 Gy
Reference Point Dosage Given to Date: 24 Gy
Reference Point Dosage Given to Date: 24 Gy
Reference Point Session Dosage Given: 3 Gy
Reference Point Session Dosage Given: 3 Gy
Session Number: 8

## 2022-11-11 NOTE — Telephone Encounter (Signed)
(  3:33 pm) PC SW scheduled initial palliative care consult/visit. Consult scheduled for 11/12/22 @ 10:15 am with palliative care RN.

## 2022-11-11 NOTE — Progress Notes (Unsigned)
[place]  [phone] [phone]) [phone]   Name: Thomas [name] Date: 11/11/2022 MRN: [id number]  DOB: 1951-09-16  Patient Care Team: Jimmy Footman, NP as PCP - General (Nurse Practitioner) Park Liter, MD as PCP - Cardiology (Cardiology) Pickenpack-Cousar, Carlena Sax, NP as Nurse Practitioner (Nurse Practitioner)     INTERVAL HISTORY: Thomas [name] is a 71 y.o. male with oncologic medical history including right iliac lytic lesion, right upper lob nodule (pending pathology), right paratracheal nodule, and right hilar and mediastinal adenopathy. He is currently undergoing radiation to right iliac and right rib. Pending additional oncology interventions based on pathology report.  Palliative ask to see for symptom management and goals of care.   SOCIAL HISTORY:     reports that he quit smoking about 28 years ago. His smoking use included cigarettes. He has a 6.00 pack-year smoking history. He has never used smokeless tobacco. He reports that he does not currently use alcohol. He reports that he does not use drugs.  ADVANCE DIRECTIVES:    CODE STATUS:   PAST MEDICAL HISTORY: Past Medical History:  Diagnosis Date   Abdominal wall pain 04/02/2021   Acute ischemic stroke ([place]) 03/22/2020   Avulsed toenail, initial encounter 05/04/2018   Dyspnea on exertion 02/10/2020   ED (erectile dysfunction)    Epidermoid cyst of neck 10/16/2017   Essential hypertension 07/21/2017   GERD (gastroesophageal reflux disease)    Gout of multiple sites 07/21/2017   History of kidney stones    History of squamous cell carcinoma excision    Hyperlipidemia    Hypertension not at goal 01/19/2014   It band syndrome, left 03/19/2020   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/24/2020   Left ureteral calculus    Lower abdominal pain 08/27/2015   Metatarsalgia of both feet 03/19/2020   Multiple joint pain 07/16/2018    Pain in joint, shoulder region 12/11/2015   Right flank pain 04/02/2021   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  MEDICATIONS:  Current Outpatient Medications  Medication Sig Dispense Refill   acetaminophen (TYLENOL) 650 MG CR tablet Take 1,300 mg by mouth in the morning and at bedtime.     Alirocumab (PRALUENT) 150 MG/ML SOAJ Inject 1 Pen into the skin every 14 (fourteen) days. (Patient taking differently: Inject 1 Pen into the skin every 14 (fourteen) days. Every other Sunday) 2 mL 11   allopurinol (ZYLOPRIM) 300 MG tablet Take 300 mg by mouth daily.     amLODipine (NORVASC) 5 MG tablet Take 1 tablet (5 mg total) by mouth daily. (Patient taking differently: Take 5 mg by mouth every evening.) 190 tablet 3   fentaNYL (DURAGESIC) 12 MCG/HR Place 1 patch onto the skin every 3 (three) days. Apply in addition to 28mcg patch to total 12mcg. 10 patch 0   fentaNYL (DURAGESIC) 25 MCG/HR Place 1 patch onto the skin every 3 (three) days. 5 patch 0   gabapentin (NEURONTIN) 300 MG capsule Take 900 mg by mouth at bedtime.     HYDROmorphone (DILAUDID) 2 MG tablet Take 1 tablet (2 mg total) by mouth every 4 (four) hours as needed for severe pain. In addition to one 4mg  tablet to total 6mg   as needed for severe pain. 90 tablet 0   HYDROmorphone (DILAUDID) 4 MG tablet Take  1 tablet (4 mg total) by mouth every 4 (four) hours as needed for severe pain. 90 tablet 0   ibuprofen (ADVIL) 200 MG tablet Take 600 mg by mouth every 8 (eight) hours as needed for moderate pain.     minoxidil (LONITEN) 10 MG tablet Take 1-1.5 tablets (10-15 mg total) by mouth 2 (two) times daily. TAKE 1 TABLET IN THE MORNING AND TAKE 1/2 TABLET AT NIGHT (Patient taking differently: Take 10 mg by mouth 2 (two) times daily.) 135 tablet 1   ondansetron (ZOFRAN-ODT) 4 MG disintegrating tablet Take 1  tablet (4 mg total) by mouth every 4 (four) hours as needed for nausea or vomiting. 20 tablet 0   pantoprazole (PROTONIX) 40 MG tablet TAKE 1 TABLET BY MOUTH TWICE A DAY (Patient taking differently: Take 40 mg by mouth 2 (two) times daily.) 180 tablet 1   polyethylene glycol (MIRALAX / GLYCOLAX) 17 g packet Take 17 g by mouth 2 (two) times daily. 14 each 0   PRESCRIPTION MEDICATION Apply 1 Application topically at bedtime as needed (pain). Compounded pain cream ([facility])     senna (SENOKOT) 8.6 MG TABS tablet Take 2 tablets (17.2 mg total) by mouth 2 (two) times daily. 120 tablet 0   valsartan (DIOVAN) 320 MG tablet TAKE 1 TABLET BY MOUTH EVERY DAY (Patient taking differently: Take 320 mg by mouth daily.) 90 tablet 3   No current facility-administered medications for this visit.    VITAL SIGNS: There were no vitals taken for this visit. There were no vitals filed for this visit.  Estimated body mass index is 27.88 kg/m as calculated from the following:   Height as of 10/27/22: 5\' 11"  (1.803 m).   Weight as of 11/07/22: 199 lb 14.4 oz (90.7 kg).   PERFORMANCE STATUS (ECOG) : 1 - Symptomatic but completely ambulatory  Physical Assessment NAD, ambulatory Normal breathing pattern  AAO x3, mood appropriate     IMPRESSION:  Thomas [name] presented to the clinic today for symptom management follow-up. He is ambulatory without assistive devices. Sitting on exam table. He is trying to remain as active as possible however continues to feel his ongoing pain limits his ability to do so. Home visit with [facility] today.   Neoplasm related pain  Thomas [name] continues to endorse ongoing pain in right hip, flank, and back area. He rates pain level 5-6/10. His goal would be at a 4 out of 10. Reports he spends most of his time in bed due to pain.   We reviewed at length current regimen: hydromorphone 6 mg every 4 hours for breakthrough pain, fentanyl 50 mcg patch, robaxin as needed for muscle  spasm, gabapentin 900 mg at bedtime. I advised him to take gabapentin 300 mg twice daily in addition to night dose however he does not feel much improvement and is not taking consistently during the day. He does note relief of neuropathic pain. Education provided on use of gabapentin and it's effectiveness. He has tried celebrex in the past with no relief and prefers not to try again. We discussed extensively his pain history. I have reviewed electronic documents from Dr. Wyline Mood ([facility]) and Dr. Warnell Forester [facility]). Thomas [name] has received nerve blocks in the past and was on a lengthy course of prednisone which he endorses some improvement for his pain. 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   We discussed ongoing pain management with realistic goals. Education provided on pain contract also given recent increase.  I emphasized safe use of medication, using as directed, and protocol for receiving refills on medications. He and wife verbalized understanding. Thomas [name] denies use of illicit drugs as well as alcohol use. He willingly signed pain contract which is required for ongoing pain management.   Education provided on short term use of dexamethasone in the setting of bone pain and inflammation. He is aware dose will be titrated down over course of 3 weeks with some hopes of improvement. We will administer Toradol injection today while in the office as he does endorse some relief after previous injections.   No adjustments on current regimen. Education provided on use of fentanyl patch. He understands refills will be sent in for 50 mc patch and he will then only need to apply 1 patch compared to 3 patches that he using now. His last radiation treatment is scheduled for tomorrow.   We will continue to closely monitor.   Constipation  Controlled.   I discussed the importance of continued conversation with family and their medical providers regarding overall  plan of care and treatment options, ensuring decisions are within the context of the patients values and GOCs.  PLAN: Fentanyl 50 mcg patch.   Hydromorphone 4 to 6 mg as needed for breakthrough pain. Is taking 6mg  around the clock.  Gabapentin 900 mg at bedtime. Does not feel any relief when taking 300 mg twice daily in addition to night dose. Toradol 30 mg IM today Zofran 4-8 mg as needed for nausea  Dexamethasone 4mg  x1 week, 2mg  x1 week and then every other to wean off.  MiraLAX/senna daily for bowel regimen We will continue to closely monitor and adjust medications as needed. I will plan to see patient back in 2-3 weeks. Sooner if needed.    Patient expressed understanding and was in agreement with this plan. He also understands that He can call the clinic at any time with any questions, concerns, or complaints.    Any controlled substances utilized were prescribed in the context of palliative care. PDMP has been reviewed.   Time Total: 45 min   Visit consisted of counseling and education dealing with the complex and emotionally intense issues of symptom management and palliative care in the setting of serious and potentially life-threatening illness.Greater than 50%  of this time was spent counseling and coordinating care related to the above assessment and plan.  Alda Lea, AGPCNP-BC  Palliative Medicine Team/[place]

## 2022-11-12 ENCOUNTER — Other Ambulatory Visit: Payer: Self-pay

## 2022-11-12 ENCOUNTER — Encounter: Payer: Self-pay | Admitting: Nurse Practitioner

## 2022-11-12 ENCOUNTER — Inpatient Hospital Stay (HOSPITAL_BASED_OUTPATIENT_CLINIC_OR_DEPARTMENT_OTHER): Payer: Medicare HMO | Admitting: Nurse Practitioner

## 2022-11-12 ENCOUNTER — Ambulatory Visit
Admission: RE | Admit: 2022-11-12 | Discharge: 2022-11-12 | Disposition: A | Discharge status: Home / Self Care | Payer: Medicare HMO | Source: Ambulatory Visit | Attending: Radiation Oncology | Admitting: Radiation Oncology

## 2022-11-12 ENCOUNTER — Encounter: Payer: Self-pay | Admitting: Internal Medicine

## 2022-11-12 ENCOUNTER — Inpatient Hospital Stay: Payer: Medicare HMO | Admitting: Nurse Practitioner

## 2022-11-12 ENCOUNTER — Other Ambulatory Visit: Payer: Medicare HMO

## 2022-11-12 VITALS — BP 165/66 | HR 90 | Temp 100.2°F | Resp 18 | Ht 71.0 in | Wt 199.4 lb

## 2022-11-12 DIAGNOSIS — E785 Hyperlipidemia, unspecified: Secondary | ICD-10-CM | POA: Diagnosis not present

## 2022-11-12 DIAGNOSIS — Z79899 Other long term (current) drug therapy: Secondary | ICD-10-CM | POA: Diagnosis not present

## 2022-11-12 DIAGNOSIS — K5730 Diverticulosis of large intestine without perforation or abscess without bleeding: Secondary | ICD-10-CM | POA: Diagnosis not present

## 2022-11-12 DIAGNOSIS — Z515 Encounter for palliative care: Secondary | ICD-10-CM

## 2022-11-12 DIAGNOSIS — J90 Pleural effusion, not elsewhere classified: Secondary | ICD-10-CM | POA: Diagnosis not present

## 2022-11-12 DIAGNOSIS — Z87891 Personal history of nicotine dependence: Secondary | ICD-10-CM | POA: Diagnosis not present

## 2022-11-12 DIAGNOSIS — Z510 Encounter for antineoplastic radiation therapy: Secondary | ICD-10-CM | POA: Diagnosis not present

## 2022-11-12 DIAGNOSIS — C7951 Secondary malignant neoplasm of bone: Secondary | ICD-10-CM | POA: Diagnosis not present

## 2022-11-12 DIAGNOSIS — R918 Other nonspecific abnormal finding of lung field: Secondary | ICD-10-CM | POA: Diagnosis not present

## 2022-11-12 DIAGNOSIS — C3490 Malignant neoplasm of unspecified part of unspecified bronchus or lung: Secondary | ICD-10-CM

## 2022-11-12 DIAGNOSIS — M109 Gout, unspecified: Secondary | ICD-10-CM | POA: Diagnosis not present

## 2022-11-12 DIAGNOSIS — G4733 Obstructive sleep apnea (adult) (pediatric): Secondary | ICD-10-CM | POA: Diagnosis not present

## 2022-11-12 DIAGNOSIS — R530 Neoplastic (malignant) related fatigue: Secondary | ICD-10-CM | POA: Diagnosis not present

## 2022-11-12 DIAGNOSIS — G893 Neoplasm related pain (acute) (chronic): Secondary | ICD-10-CM

## 2022-11-12 DIAGNOSIS — C3431 Malignant neoplasm of lower lobe, right bronchus or lung: Secondary | ICD-10-CM | POA: Diagnosis not present

## 2022-11-12 DIAGNOSIS — M792 Neuralgia and neuritis, unspecified: Secondary | ICD-10-CM

## 2022-11-12 DIAGNOSIS — I10 Essential (primary) hypertension: Secondary | ICD-10-CM | POA: Diagnosis not present

## 2022-11-12 DIAGNOSIS — Z87442 Personal history of urinary calculi: Secondary | ICD-10-CM | POA: Diagnosis not present

## 2022-11-12 DIAGNOSIS — K5900 Constipation, unspecified: Secondary | ICD-10-CM | POA: Diagnosis not present

## 2022-11-12 DIAGNOSIS — C3411 Malignant neoplasm of upper lobe, right bronchus or lung: Secondary | ICD-10-CM | POA: Diagnosis not present

## 2022-11-12 DIAGNOSIS — I3139 Other pericardial effusion (noninflammatory): Secondary | ICD-10-CM | POA: Diagnosis not present

## 2022-11-12 DIAGNOSIS — K828 Other specified diseases of gallbladder: Secondary | ICD-10-CM | POA: Diagnosis not present

## 2022-11-12 DIAGNOSIS — K219 Gastro-esophageal reflux disease without esophagitis: Secondary | ICD-10-CM | POA: Diagnosis not present

## 2022-11-12 DIAGNOSIS — K760 Fatty (change of) liver, not elsewhere classified: Secondary | ICD-10-CM | POA: Diagnosis not present

## 2022-11-12 DIAGNOSIS — M898X5 Other specified disorders of bone, thigh: Secondary | ICD-10-CM

## 2022-11-12 DIAGNOSIS — I6782 Cerebral ischemia: Secondary | ICD-10-CM | POA: Diagnosis not present

## 2022-11-12 LAB — RAD ONC ARIA SESSION SUMMARY
Course Elapsed Days: 13
Plan Fractions Treated to Date: 9
Plan Fractions Treated to Date: 9
Plan Prescribed Dose Per Fraction: 3 Gy
Plan Prescribed Dose Per Fraction: 3 Gy
Plan Total Fractions Prescribed: 10
Plan Total Fractions Prescribed: 10
Plan Total Prescribed Dose: 30 Gy
Plan Total Prescribed Dose: 30 Gy
Reference Point Dosage Given to Date: 27 Gy
Reference Point Dosage Given to Date: 27 Gy
Reference Point Session Dosage Given: 3 Gy
Reference Point Session Dosage Given: 3 Gy
Session Number: 9

## 2022-11-12 MED ORDER — FENTANYL 25 MCG/HR TD PT72: 1.0000 | MEDICATED_PATCH | TRANSDERMAL | 0 refills | Status: AC

## 2022-11-12 MED ORDER — FENTANYL 12 MCG/HR TD PT72: 2.0000 | MEDICATED_PATCH | TRANSDERMAL | 0 refills | Status: AC

## 2022-11-12 MED ORDER — ONDANSETRON 4 MG PO TBDP: 4.0000 mg | ORAL_TABLET | Freq: Four times a day (QID) | ORAL | 1 refills | Status: DC | PRN

## 2022-11-12 MED ORDER — KETOROLAC TROMETHAMINE 30 MG/ML IJ SOLN
30.0000 mg | Freq: Once | INTRAMUSCULAR | Status: AC
  Administered 2022-11-12: 30 mg via INTRAMUSCULAR
  Filled 2022-11-12: qty 1

## 2022-11-12 MED ORDER — FENTANYL 50 MCG/HR TD PT72: 1.0000 | MEDICATED_PATCH | TRANSDERMAL | 0 refills | Status: DC

## 2022-11-12 MED ORDER — HYDROMORPHONE HCL 4 MG PO TABS: 4.0000 mg | ORAL_TABLET | ORAL | 0 refills | Status: DC | PRN

## 2022-11-12 MED ORDER — HYDROMORPHONE HCL 2 MG PO TABS: 2.0000 mg | ORAL_TABLET | ORAL | 0 refills | Status: DC | PRN

## 2022-11-12 MED ORDER — DEXAMETHASONE 2 MG PO TABS: ORAL_TABLET | ORAL | 0 refills | Status: DC

## 2022-11-12 NOTE — Patient Instructions (Signed)
Ketorolac Injection What is this medication? KETOROLAC (kee toe ROLE ak) treats short-term moderate to severe pain. It works by decreasing inflammation. It belongs to a group of medications called NSAIDs. This medicine may be used for other purposes; ask your health care provider or pharmacist if you have questions. COMMON BRAND NAME(S): Toradol What should I tell my care team before I take this medication? They need to know if you have any of these conditions: Bleeding disorder Coronary artery bypass graft (CABG) within the past 2 weeks Frequently drink alcohol Heart attack Heart disease Heart failure High blood pressure Kidney disease Liver disease Lung or breathing disease, such as asthma or COPD Receiving steroids, such as dexamethasone or prednisone Stomach bleeding Stomach ulcers, other stomach or intestine problems Take medication to treat or prevent blood clots Tobacco use An unusual or allergic reaction to ketorolac, other medications, foods, dyes, or preservatives Pregnant or trying to get pregnant Breastfeeding How should I use this medication? This medication is injected into a vein or muscle. It is given by your care team in a hospital or clinic setting. A special MedGuide will be given to you before each treatment. Be sure to read this information carefully each time. Talk to your care team about the use of this medication in children. Special care may be needed. People 65 years and older may have a stronger reaction and need a smaller dose. Overdosage: If you think you have taken too much of this medicine contact a poison control center or emergency room at once. NOTE: This medicine is only for you. Do not share this medicine with others. What if I miss a dose? This does not apply. This medication is not for regular use. What may interact with this medication? Do not take this medication with any of the following: Aspirin and aspirin-like  medications Cidofovir Methotrexate NSAIDs, medications for pain and inflammation, such as ibuprofen or naproxen Pentoxifylline Probenecid This medication may also interact with the following: Alcohol Alendronate Alprazolam Carbamazepine Diuretics Flavocoxid Fluoxetine Ginkgo Lithium Medications for blood pressure, such as enalapril Medications that affect platelets, such as pentoxifylline Medications that prevent or treat blood clots, such as heparin or warfarin Medications that relax muscles Pemetrexed Phenytoin Thiothixene This list may not describe all possible interactions. Give your health care provider a list of all the medicines, herbs, non-prescription drugs, or dietary supplements you use. Also tell them if you smoke, drink alcohol, or use illegal drugs. Some items may interact with your medicine. What should I watch for while using this medication? Your condition will be monitored carefully while you are receiving this medication. Do not use this medication for more than 5 days. It is only used for short-term treatment of moderate to severe pain. The risk of side effects such as kidney damage and stomach bleeding are higher if used for more than 5 days. Do not take other medications that contain aspirin, ibuprofen, or naproxen with this medication. Side effects such as stomach upset, nausea, or ulcers may be more likely to occur. Many non-prescription medications contain aspirin, ibuprofen, or naproxen. Always read labels carefully. This medication can cause serious ulcers and bleeding in the stomach. It can happen with no warning. Tobacco, alcohol, older age, and poor health can also increase risks. Call your care team right away if you have stomach pain or blood in your vomit or stool. This medication may cause serious skin reactions. They can happen weeks to months after starting the medication. Contact your care team right away  if you notice fevers or flu-like symptoms with  a rash. The rash may be red or purple and then turn into blisters or peeling of the skin. You may also notice a red rash with swelling of the face, lips, or lymph nodes in your neck or under your arms. Talk to your care team if you wish to become pregnant or think you might be pregnant. This medication can cause serious birth defects. This medication does not prevent a heart attack or stroke. This medication may increase the chance of a heart attack or stroke. The chance may increase the longer you use this medication or if you have heart disease. If you take aspirin to prevent a heart attack or stroke, talk to your care team about using this medication. This medication may affect your coordination, reaction time, or judgment. Do not drive or operate machinery until you know how this medication affects you. Sit up or stand slowly to reduce the risk of dizzy or fainting spells. Drinking alcohol with this medication can increase the risk of these side effects. What side effects may I notice from receiving this medication? Side effects that you should report to your care team as soon as possible: Allergic reactions--skin rash, itching, hives, swelling of the face, lips, tongue, or throat Heart attack--pain or tightness in the chest, shoulders, arms, or jaw, nausea, shortness of breath, cold or clammy skin, feeling faint or lightheaded Heart failure--shortness of breath, swelling of ankles, feet, or hands, sudden weight gain, unusual weakness or fatigue Increase in blood pressure Kidney injury--decrease in the amount of urine, swelling of the ankles, hands, or feet Liver injury--right upper belly pain, loss of appetite, nausea, light-colored stool, dark yellow or brown urine, yellowing skin or eyes, unusual weakness or fatigue Low red blood cell count--unusual weakness or fatigue, dizziness, headache, trouble breathing Rash, fever, and swollen lymph nodes Redness, blistering, peeling, or loosening of the  skin, including inside the mouth Stomach bleeding--bloody or black, tar-like stools, vomiting blood or brown material that looks like coffee grounds Stroke--sudden numbness or weakness of the face, arm, or leg, trouble speaking, confusion, trouble walking, loss of balance or coordination, dizziness, severe headache, change in vision Side effects that usually do not require medical attention (report to your care team if they continue or are bothersome): Constipation Diarrhea Dizziness Headache Nausea Stomach pain This list may not describe all possible side effects. Call your doctor for medical advice about side effects. You may report side effects to FDA at 1-800-FDA-1088. Where should I keep my medication? This medication is given in a hospital or clinic. It will not be stored at home. NOTE: This sheet is a summary. It may not cover all possible information. If you have questions about this medicine, talk to your doctor, pharmacist, or health care provider.  2023 [employer]/Gold Standard (2021-08-05 00:00:00)

## 2022-11-12 NOTE — Patient Instructions (Addendum)
-   you can increase your zofran to 2 tablets if needed but take it every 6 hours, if not needed continue to take 1 tab every 4 hours - the fentanyl patches will be refilled for a 50 mcg patch, so you will only need to place one on at that time - you can increase your robaxin (muscle relaxer) to take every 6-8 hours as needed - pick up and take dexamethasone (steroid) and take as directed on the box

## 2022-11-12 NOTE — Progress Notes (Signed)
1017 Palliative Care Note  I connected with  Thomas [name] on 11/12/22 by telephone and verified that I am speaking with the correct person using two identifiers.   I discussed the limitations of evaluation and management by telemedicine. The patient expressed understanding and agreed to proceed.   Disease Process:  Pt reports that he had been having pain in back right side and right hip for sometime. Underwent hip replacement in 08/23. Dx in early 12/23 with cancer. Knows he  has bone cancer. States, " they think the primary site was lungs. We are waiting on pet scan scheduled for Jan 4 to see where else it has spread to. Once we know that, I meet with oncologist on Jan 10 to come up with a plan for chemo." Pt is currently undergoing radiation treatments daily with last treatment (#10) tomorrow 11/13/22. Pt does want aggressive treatment if possible.  Cognitive: Pt is alert and oriented. Continues to work from home.   Eating: Pt reports that there has been a decrease in oral intake. Reports that he gets nauseous a lot. Denies difficulty with drinking liquids. RN discussed options for aiding with nausea such as peppermint tea, ginger drops,etc.  Mobility: Pt ambulates without assistance. Reports using wheelchair initially after hip replacement graduating to using a cane and then nothing.   Pain: Pt reports having constant pain. Reports that he uses one Fentanyl 25 mg and 2 Fentanyl 12 mg patches that he changes every 72 hours (due to be changed tomorrow 11/13/22). Also uses dilaudid tablets. Takes 6 mg every four hours. Currently rates pain to back, right mid shoulder blade area 5 on 1-10 scale. States, " I really can function optimally if my pain is down around a 4". Reports pain is worse with movement and usage of right arm.  Socially: pt lives at home with wife and daughter. Pt still works from home and wants to pursue treatment as long as possible. Had a sister who had hx of hodgkins lymphoma,  underwent chemo/radiation, went into remission, and then died from breast cancer later on as an adult. Independent with ADLs at this point.   Palliative Care/ Hospice- RN discussed purpose of palliative care and difference in the two services. Pt states, "well talking about that is a little premature."   Next appt is 11/28/21@1000 . Pt does not want people in his house d/t risk of infection and flu season. RN will call.

## 2022-11-13 ENCOUNTER — Ambulatory Visit
Admission: RE | Admit: 2022-11-13 | Discharge: 2022-11-13 | Disposition: A | Discharge status: Home / Self Care | Payer: Medicare HMO | Source: Ambulatory Visit | Attending: Radiation Oncology | Admitting: Radiation Oncology

## 2022-11-13 ENCOUNTER — Other Ambulatory Visit: Payer: Self-pay

## 2022-11-13 ENCOUNTER — Telehealth: Payer: Self-pay

## 2022-11-13 DIAGNOSIS — Z87891 Personal history of nicotine dependence: Secondary | ICD-10-CM | POA: Diagnosis not present

## 2022-11-13 DIAGNOSIS — Z510 Encounter for antineoplastic radiation therapy: Secondary | ICD-10-CM | POA: Diagnosis not present

## 2022-11-13 DIAGNOSIS — C7951 Secondary malignant neoplasm of bone: Secondary | ICD-10-CM | POA: Diagnosis not present

## 2022-11-13 DIAGNOSIS — C3431 Malignant neoplasm of lower lobe, right bronchus or lung: Secondary | ICD-10-CM | POA: Diagnosis not present

## 2022-11-13 DIAGNOSIS — C3411 Malignant neoplasm of upper lobe, right bronchus or lung: Secondary | ICD-10-CM | POA: Diagnosis not present

## 2022-11-13 LAB — RAD ONC ARIA SESSION SUMMARY
Course Elapsed Days: 14
Plan Fractions Treated to Date: 10
Plan Fractions Treated to Date: 10
Plan Prescribed Dose Per Fraction: 3 Gy
Plan Prescribed Dose Per Fraction: 3 Gy
Plan Total Fractions Prescribed: 10
Plan Total Fractions Prescribed: 10
Plan Total Prescribed Dose: 30 Gy
Plan Total Prescribed Dose: 30 Gy
Reference Point Dosage Given to Date: 30 Gy
Reference Point Dosage Given to Date: 30 Gy
Reference Point Session Dosage Given: 3 Gy
Reference Point Session Dosage Given: 3 Gy
Session Number: 10

## 2022-11-13 LAB — GUARDANT 360

## 2022-11-13 NOTE — Telephone Encounter (Signed)
Pt called reporting that he was unable to pick up hydromorphone 2mg  tabs at his pharmacy. This RN called the pharmacy and pharmacy confirmed it was a prior authorization issue, this RN called pt insurance company and spoke with Dominica Severin there. Dominica Severin assisted in obtaining a prior [place]. Which was approved for this medication. Rob, the pharmacist at pt [facility] called to confirm approved authorization. Pt also called back to update on medication status. Pt also made aware that it can take the pharmacy some time to receive the notification of the approved status of a drug and he should call the pharmacy for a pick up time. Pt and wife verbalized understanding and gratitude. No further questions or concerns at this time.

## 2022-11-16 DIAGNOSIS — C3492 Malignant neoplasm of unspecified part of left bronchus or lung: Secondary | ICD-10-CM | POA: Diagnosis not present

## 2022-11-18 ENCOUNTER — Encounter (HOSPITAL_COMMUNITY): Payer: Self-pay | Admitting: Internal Medicine

## 2022-11-19 ENCOUNTER — Encounter: Payer: Self-pay | Admitting: Medical Oncology

## 2022-11-19 ENCOUNTER — Telehealth: Payer: Self-pay | Admitting: Medical Oncology

## 2022-11-19 ENCOUNTER — Other Ambulatory Visit: Payer: Self-pay | Admitting: Nurse Practitioner

## 2022-11-19 ENCOUNTER — Encounter: Payer: Self-pay | Admitting: Pharmacist

## 2022-11-19 ENCOUNTER — Encounter: Payer: Self-pay | Admitting: Internal Medicine

## 2022-11-19 DIAGNOSIS — G893 Neoplasm related pain (acute) (chronic): Secondary | ICD-10-CM

## 2022-11-19 DIAGNOSIS — M792 Neuralgia and neuritis, unspecified: Secondary | ICD-10-CM

## 2022-11-19 DIAGNOSIS — M898X5 Other specified disorders of bone, thigh: Secondary | ICD-10-CM

## 2022-11-19 MED ORDER — DULOXETINE HCL 30 MG PO CPEP: 30.0000 mg | ORAL_CAPSULE | Freq: Every day | ORAL | 1 refills | Status: DC

## 2022-11-19 NOTE — Telephone Encounter (Signed)
Why was his PET scan cancelled?

## 2022-11-20 ENCOUNTER — Ambulatory Visit (HOSPITAL_COMMUNITY): Payer: BLUE CROSS/BLUE SHIELD

## 2022-11-20 ENCOUNTER — Telehealth: Payer: Self-pay | Admitting: Medical Oncology

## 2022-11-20 ENCOUNTER — Ambulatory Visit (HOSPITAL_COMMUNITY)
Admission: RE | Admit: 2022-11-20 | Discharge: 2022-11-20 | Disposition: A | Discharge status: Home / Self Care | Payer: Medicare Other | Source: Ambulatory Visit | Attending: Internal Medicine | Admitting: Internal Medicine

## 2022-11-20 DIAGNOSIS — C3490 Malignant neoplasm of unspecified part of unspecified bronchus or lung: Secondary | ICD-10-CM | POA: Diagnosis present

## 2022-11-20 DIAGNOSIS — R911 Solitary pulmonary nodule: Secondary | ICD-10-CM | POA: Diagnosis not present

## 2022-11-20 MED ORDER — FLUDEOXYGLUCOSE F - 18 (FDG) INJECTION
10.1300 | Freq: Once | INTRAVENOUS | Status: AC | PRN
  Administered 2022-11-20: 10.13 via INTRAVENOUS

## 2022-11-20 NOTE — Telephone Encounter (Signed)
PET scan completed. It was not authorized for DOS  under his new [employer].   Message sent to [facility] for assistance.

## 2022-11-25 ENCOUNTER — Ambulatory Visit: Payer: Medicare HMO | Admitting: Gastroenterology

## 2022-11-25 NOTE — Radiation Completion Notes (Signed)
[name], Thomas [name] MRN: [id number] Date of Birth: 08/19/1951 Referring Physician: Cristie Hem, M.D. Date of Service: 2022-11-25 Radiation Oncologist: Teryl Lucy, M.D. [place]                             Radiation Oncology End of Treatment Note     Diagnosis: C79.51 Secondary malignant neoplasm of bone Intent: Palliative     ==========DELIVERED PLANS==========  First Treatment Date: 2022-10-30 - Last Treatment Date: 2022-11-13   Plan Name: Chest_R_Rib Site: Ribs, Right Technique: 3D Mode: Photon Dose Per Fraction: 3 Gy Prescribed Dose (Delivered / Prescribed): 30 Gy / 30 Gy Prescribed Fxs (Delivered / Prescribed): 10 / 10   Plan Name: Pelvis_R_Ilia Site: Ilium, Right Technique: 3D Mode: Photon Dose Per Fraction: 3 Gy Prescribed Dose (Delivered / Prescribed): 30 Gy / 30 Gy Prescribed Fxs (Delivered / Prescribed): 10 / 10     ==========ON TREATMENT VISIT DATES========== 2022-11-04, 2022-11-11     ==========UPCOMING VISITS==========       ==========APPENDIX - ON TREATMENT VISIT NOTES==========   ImpPlan 2022-11-04 The patient is tolerating radiation. Continue treatment as planned.   PatEd 2022-11-04 Ongoing education performed.   PhysExam 2022-11-04 Alert, no acute distress.   ProgNote 2022-11-04 Specific Site [ RT ribs, RT Ilium ] Changes from last week/visit? [ No ] Pain? [ Yes 8/10 ] Fatigue? [ Yes ] Skin irritation? [ No ] Current medication regimen: [ Yes- Pain meds adjusted ] Need refills: [ No ] Additional  Weekly Progress Notes [  ]    PatEd 2022-11-05 Ongoing education performed.   ImpPlan 2022-11-05 The patient is tolerating radiation. Continue treatment as planned.   PhysExam 2022-11-05 Alert, no acute distress.   PatEd 2022-11-11 Ongoing education performed.   ImpPlan 2022-11-11 The patient is tolerating radiation. Continue treatment as planned.   PhysExam 2022-11-11 Alert, no acute  distress.   ProgNote 2022-11-11 Specific Site [ RT ribs ] Changes from last week/visit? [ No ] Pain? [ 6/10 ] Fatigue? [ Yes ] Skin irritation? [ No ] Current medication regimen: [ Yes ] Need refills: [ No ] Additional  Weekly Progress Notes [  ]

## 2022-11-26 ENCOUNTER — Other Ambulatory Visit: Payer: Self-pay

## 2022-11-26 ENCOUNTER — Inpatient Hospital Stay: Payer: Medicare Other | Attending: Internal Medicine | Admitting: Internal Medicine

## 2022-11-26 ENCOUNTER — Inpatient Hospital Stay: Payer: Medicare Other

## 2022-11-26 VITALS — BP 196/73 | HR 95 | Resp 17 | Wt 200.2 lb

## 2022-11-26 DIAGNOSIS — I2510 Atherosclerotic heart disease of native coronary artery without angina pectoris: Secondary | ICD-10-CM | POA: Diagnosis not present

## 2022-11-26 DIAGNOSIS — I700 Atherosclerosis of aorta: Secondary | ICD-10-CM | POA: Diagnosis not present

## 2022-11-26 DIAGNOSIS — I3139 Other pericardial effusion (noninflammatory): Secondary | ICD-10-CM | POA: Insufficient documentation

## 2022-11-26 DIAGNOSIS — N200 Calculus of kidney: Secondary | ICD-10-CM | POA: Insufficient documentation

## 2022-11-26 DIAGNOSIS — C3491 Malignant neoplasm of unspecified part of right bronchus or lung: Secondary | ICD-10-CM

## 2022-11-26 DIAGNOSIS — Z923 Personal history of irradiation: Secondary | ICD-10-CM | POA: Insufficient documentation

## 2022-11-26 DIAGNOSIS — K760 Fatty (change of) liver, not elsewhere classified: Secondary | ICD-10-CM | POA: Insufficient documentation

## 2022-11-26 DIAGNOSIS — E785 Hyperlipidemia, unspecified: Secondary | ICD-10-CM | POA: Insufficient documentation

## 2022-11-26 DIAGNOSIS — Z5112 Encounter for antineoplastic immunotherapy: Secondary | ICD-10-CM | POA: Insufficient documentation

## 2022-11-26 DIAGNOSIS — M47814 Spondylosis without myelopathy or radiculopathy, thoracic region: Secondary | ICD-10-CM | POA: Insufficient documentation

## 2022-11-26 DIAGNOSIS — M47816 Spondylosis without myelopathy or radiculopathy, lumbar region: Secondary | ICD-10-CM | POA: Diagnosis not present

## 2022-11-26 DIAGNOSIS — Z7952 Long term (current) use of systemic steroids: Secondary | ICD-10-CM | POA: Insufficient documentation

## 2022-11-26 DIAGNOSIS — M109 Gout, unspecified: Secondary | ICD-10-CM | POA: Insufficient documentation

## 2022-11-26 DIAGNOSIS — K219 Gastro-esophageal reflux disease without esophagitis: Secondary | ICD-10-CM | POA: Insufficient documentation

## 2022-11-26 DIAGNOSIS — G893 Neoplasm related pain (acute) (chronic): Secondary | ICD-10-CM

## 2022-11-26 DIAGNOSIS — Z87442 Personal history of urinary calculi: Secondary | ICD-10-CM | POA: Diagnosis not present

## 2022-11-26 DIAGNOSIS — K5730 Diverticulosis of large intestine without perforation or abscess without bleeding: Secondary | ICD-10-CM | POA: Diagnosis not present

## 2022-11-26 DIAGNOSIS — J910 Malignant pleural effusion: Secondary | ICD-10-CM | POA: Insufficient documentation

## 2022-11-26 DIAGNOSIS — I6782 Cerebral ischemia: Secondary | ICD-10-CM | POA: Insufficient documentation

## 2022-11-26 DIAGNOSIS — I10 Essential (primary) hypertension: Secondary | ICD-10-CM | POA: Diagnosis not present

## 2022-11-26 DIAGNOSIS — C3411 Malignant neoplasm of upper lobe, right bronchus or lung: Secondary | ICD-10-CM | POA: Insufficient documentation

## 2022-11-26 DIAGNOSIS — Z79899 Other long term (current) drug therapy: Secondary | ICD-10-CM | POA: Insufficient documentation

## 2022-11-26 DIAGNOSIS — Z515 Encounter for palliative care: Secondary | ICD-10-CM

## 2022-11-26 DIAGNOSIS — C7800 Secondary malignant neoplasm of unspecified lung: Secondary | ICD-10-CM | POA: Insufficient documentation

## 2022-11-26 DIAGNOSIS — C7951 Secondary malignant neoplasm of bone: Secondary | ICD-10-CM | POA: Diagnosis not present

## 2022-11-26 DIAGNOSIS — C782 Secondary malignant neoplasm of pleura: Secondary | ICD-10-CM | POA: Diagnosis not present

## 2022-11-26 DIAGNOSIS — C3490 Malignant neoplasm of unspecified part of unspecified bronchus or lung: Secondary | ICD-10-CM

## 2022-11-26 DIAGNOSIS — Z5111 Encounter for antineoplastic chemotherapy: Secondary | ICD-10-CM | POA: Diagnosis not present

## 2022-11-26 DIAGNOSIS — N281 Cyst of kidney, acquired: Secondary | ICD-10-CM | POA: Insufficient documentation

## 2022-11-26 DIAGNOSIS — K5900 Constipation, unspecified: Secondary | ICD-10-CM | POA: Insufficient documentation

## 2022-11-26 MED ORDER — CYANOCOBALAMIN 1000 MCG/ML IJ SOLN
1000.0000 ug | Freq: Once | INTRAMUSCULAR | Status: AC
  Administered 2022-11-26: 1000 ug via INTRAMUSCULAR
  Filled 2022-11-26: qty 1

## 2022-11-26 MED ORDER — KETOROLAC TROMETHAMINE 30 MG/ML IJ SOLN
30.0000 mg | Freq: Once | INTRAMUSCULAR | Status: AC
  Administered 2022-11-26: 30 mg via INTRAMUSCULAR
  Filled 2022-11-26: qty 1

## 2022-11-26 MED ORDER — PANTOPRAZOLE SODIUM 40 MG PO TBEC: 40.0000 mg | DELAYED_RELEASE_TABLET | Freq: Two times a day (BID) | ORAL | 2 refills | Status: DC

## 2022-11-26 MED ORDER — FOLIC ACID 1 MG PO TABS: 1.0000 mg | ORAL_TABLET | Freq: Every day | ORAL | 4 refills | Status: DC

## 2022-11-26 NOTE — Progress Notes (Signed)
Orders for IM Toradol injection placed under signed and held per Lexine Baton, NP, Protonix refill sent to pharmacy.

## 2022-11-26 NOTE — Progress Notes (Signed)
START ON PATHWAY REGIMEN - Non-Small Cell Lung     A cycle is every 21 days:     Pembrolizumab      Pemetrexed      Carboplatin   **Always confirm dose/schedule in your pharmacy ordering system**  Patient Characteristics: Stage IV Metastatic, Nonsquamous, Molecular Analysis Completed, Molecular Alteration Present and Targeted Therapy Exhausted OR EGFR Exon 20+ or KRAS G12C+ or HER2+ Present and No Prior Chemo/Immunotherapy OR No Alteration Present, Initial  Chemotherapy/Immunotherapy, PS = 0, 1, BRAF/MET/KRAS/HER2 Mutation Positive, Candidate for Immunotherapy, PD-L1 Expression Positive 1-49% (TPS) / Negative / Not Tested / Awaiting Test Results and Immunotherapy Candidate Therapeutic Status: Stage IV Metastatic Histology: Nonsquamous Cell Broad Molecular Profiling Status: Molecular Analysis Completed Molecular Analysis Results: KRAS G12C Mutation Present and No Prior Chemo/Immunotherapy ECOG Performance Status: 1 Chemotherapy/Immunotherapy Line of Therapy: Initial Chemotherapy/Immunotherapy Immunotherapy Candidate Status: Candidate for Immunotherapy PD-L1 Expression Status: PD-L1 Negative Intent of Therapy: Non-Curative / Palliative Intent, Discussed with Patient

## 2022-11-26 NOTE — Addendum Note (Signed)
Addended by: Rolland Bimler on: 11/26/2022 04:57 PM   Modules accepted: Orders

## 2022-11-26 NOTE — Progress Notes (Signed)
[facility] [phone]   [phone]  OFFICE PROGRESS NOTE  Pickenpack-Cousar, Carlena Sax, NP [address] [place] [address]  DIAGNOSIS: Stage IV (T1b, N3, M1 C) non-small cell lung cancer, adenocarcinoma presented with right upper lobe lung nodule in addition to mediastinal and right hilar adenopathy as well as right-sided pleural metastatic disease with malignant right pleural effusion and multiple osseous metastatic lesions and single hypermetabolic retroperitoneal lymph node diagnosed in December 2023  Biomarker Findings Microsatellite status - MS-Stable Tumor Mutational Burden - 2 Muts/Mb Genomic Findings For a complete list of the genes assayed, please refer to the Appendix. KRAS I95J KEAP1 splice site [id number]>S MTAP loss CDKN2A/B CDKN2A loss, CDKN2B loss MDM4 S367L - subclonal? 7 Disease relevant genes with no reportable alterations: ALK, BRAF, EGFR, ERBB2, MET, RET, ROS1  PDL1 Expression: 0%  PRIOR THERAPY: Status post palliative radiotherapy to the metastatic disease in the right hip area under the care of Dr. Sondra Come  CURRENT THERAPY: Systemic chemotherapy with carboplatin for AUC of 5, Alimta 500 Mg/M2 and Keytruda 200 Mg IV every 3 weeks.  First dose December 03, 2022.  INTERVAL HISTORY: Thomas [name] 72 y.o. male returns to the clinic today for hospital follow-up visit.  The patient was seen during radiation when he presented with right-sided pleural effusion as well as pleural-based right lung masses, mediastinal lymphadenopathy as well as lytic lesion in the hip area.  This was biopsied and it was consistent with metastatic adenocarcinoma.  He had molecular studies performed recently that showed positive KRAS G12C mutation but negative PD-L1 expression.  The patient underwent palliative radiotherapy to the right hip area under the care of Dr. Sondra Come.  His pain in the hip area much improved but he continues to have pain on  the right side of the chest.  He is currently on fentanyl patch 50 mcg/hour every 3 days in addition to Dilaudid 6 mg every 4 hours for breakthrough pain management.  He continues to have significant amount of pain on the right side of the chest and he is followed by the palliative care team at the cancer center for adjustment of his medication.  He denied having any current pain express has mild cough with no hemoptysis.  He has no nausea, vomiting, diarrhea but occasional constipation.  He has no headache or visual changes.  He had a recent PET scan performed and he is here for evaluation and discussion of his treatment options based on the molecular studies and the PET scan results.  MEDICAL HISTORY: Past Medical History:  Diagnosis Date   Abdominal wall pain 04/02/2021   Acute ischemic stroke ([facility]) 03/22/2020   Avulsed toenail, initial encounter 05/04/2018   Dyspnea on exertion 02/10/2020   ED (erectile dysfunction)    Epidermoid cyst of neck 10/16/2017   Essential hypertension 07/21/2017   GERD (gastroesophageal reflux disease)    Gout of multiple sites 07/21/2017   History of kidney stones    History of squamous cell carcinoma excision    Hyperlipidemia    Hypertension not at goal 01/19/2014   It band syndrome, left 03/19/2020   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/24/2020   Left ureteral calculus    Lower abdominal pain 08/27/2015   Metatarsalgia of both feet 03/19/2020   Multiple joint pain 07/16/2018   Pain in joint, shoulder region 12/11/2015   Right flank pain 04/02/2021   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  MEDICATIONS:  Current Outpatient Medications  Medication Sig Dispense Refill   acetaminophen (TYLENOL) 650 MG CR tablet Take 1,300 mg by mouth in the morning and at bedtime.     Alirocumab (PRALUENT) 150 MG/ML  SOAJ Inject 1 Pen into the skin every 14 (fourteen) days. (Patient taking differently: Inject 1 Pen into the skin every 14 (fourteen) days. Every other Sunday) 2 mL 11   allopurinol (ZYLOPRIM) 300 MG tablet Take 300 mg by mouth daily.     amLODipine (NORVASC) 5 MG tablet Take 1 tablet (5 mg total) by mouth daily. (Patient taking differently: Take 5 mg by mouth every evening.) 190 tablet 3   dexamethasone (DECADRON) 2 MG tablet Take 2 tablets (4mg ) for 7 days. Then decrease to 1 (2mg ) tablet for 7 days. Then decrease to 1 (2mg ) tablet every other day. 26 tablet 0   DULoxetine (CYMBALTA) 30 MG capsule Take 1 capsule (30 mg total) by mouth daily. 30 capsule 1   fentaNYL (DURAGESIC) 50 MCG/HR Place 1 patch onto the skin every 3 (three) days. 10 patch 0   gabapentin (NEURONTIN) 300 MG capsule Take 900 mg by mouth at bedtime.     HYDROmorphone (DILAUDID) 2 MG tablet Take 1 tablet (2 mg total) by mouth every 4 (four) hours as needed for severe pain. In addition to one 4mg  tablet to total 6mg   as needed for severe pain. 90 tablet 0   HYDROmorphone (DILAUDID) 4 MG tablet Take 1 tablet (4 mg total) by mouth every 4 (four) hours as needed for severe pain. 90 tablet 0   ibuprofen (ADVIL) 200 MG tablet Take 600 mg by mouth every 8 (eight) hours as needed for moderate pain.     minoxidil (LONITEN) 10 MG tablet Take 1-1.5 tablets (10-15 mg total) by mouth 2 (two) times daily. TAKE 1 TABLET IN THE MORNING AND TAKE 1/2 TABLET AT NIGHT (Patient taking differently: Take 10 mg by mouth 2 (two) times daily.) 135 tablet 1   ondansetron (ZOFRAN-ODT) 4 MG disintegrating tablet Take 1-2 tablets (4-8 mg total) by mouth every 6 (six) hours as needed for nausea or vomiting. 60 tablet 1   pantoprazole (PROTONIX) 40 MG tablet Take 1 tablet (40 mg total) by mouth 2 (two) times daily. 60 tablet 2   polyethylene glycol (MIRALAX / GLYCOLAX) 17 g packet Take 17 g by mouth 2 (two) times daily. 14 each 0   PRESCRIPTION MEDICATION Apply 1  Application topically at bedtime as needed (pain). Compounded pain cream ([facility])     senna (SENOKOT) 8.6 MG TABS tablet Take 2 tablets (17.2 mg total) by mouth 2 (two) times daily. 120 tablet 0   valsartan (DIOVAN) 320 MG tablet TAKE 1 TABLET BY MOUTH EVERY DAY (Patient taking differently: Take 320 mg by mouth daily.) 90 tablet 3   No current facility-administered medications for this visit.    SURGICAL HISTORY:  Past Surgical History:  Procedure Laterality Date   COLONOSCOPY WITH PROPOFOL  2017   CYSTOSCOPY/RETROGRADE/URETEROSCOPY/STONE EXTRACTION WITH BASKET Left 04/16/2018   Procedure: CYSTOSCOPY/RETROGRADE/URETEROSCOPY/STONE EXTRACTION WITH BASKET/ HOLMIUM LASER LITHOTRIPSY/ STENT PLACEMENT;  Surgeon: Ardis Hughs, MD;  Location: [facility];  Service: Urology;  Laterality: Left;   HERNIA REPAIR Right 2022   inguinal   HOLMIUM LASER APPLICATION Left [id number]   Procedure: HOLMIUM LASER APPLICATION;  Surgeon: Louis Meckel,  Viona Gilmore, MD;  Location[facility];  Service: Urology;  Laterality: Left;   TOTAL HIP ARTHROPLASTY Right 06/30/2022   Procedure: RIGHT TOTAL HIP ARTHROPLASTY ANTERIOR APPROACH;  Surgeon: Leandrew Koyanagi, MD;  Location: [facility];  Service: Orthopedics;  Laterality: Right;  3-C    REVIEW OF SYSTEMS:  Constitutional: positive for fatigue Eyes: negative Ears, nose, mouth, throat, and face: negative Respiratory: positive for cough and pleurisy/chest pain Cardiovascular: negative Gastrointestinal: positive for constipation Genitourinary:negative Integument/breast: negative Hematologic/lymphatic: negative Musculoskeletal:negative Neurological: negative Behavioral/Psych: negative Endocrine: negative Allergic/Immunologic: negative   PHYSICAL EXAMINATION: General appearance: alert, cooperative, fatigued, and no distress Head: Normocephalic, without obvious abnormality, atraumatic Neck: no adenopathy, no JVD, supple,  symmetrical, trachea midline, and thyroid not enlarged, symmetric, no tenderness/mass/nodules Lymph nodes: Cervical, supraclavicular, and axillary nodes normal. Resp: diminished breath sounds RLL and dullness to percussion RLL Back: symmetric, no curvature. ROM normal. No CVA tenderness. Cardio: regular rate and rhythm, S1, S2 normal, no murmur, click, rub or gallop GI: soft, non-tender; bowel sounds normal; no masses,  no organomegaly Extremities: extremities normal, atraumatic, no cyanosis or edema Neurologic: Alert and oriented X 3, normal strength and tone. Normal symmetric reflexes. Normal coordination and gait  ECOG PERFORMANCE STATUS: 1 - Symptomatic but completely ambulatory  Blood pressure (!) 196/73, pulse 95, resp. rate 17, weight 200 lb 3 oz (90.8 kg), SpO2 99 %.  LABORATORY DATA: Lab Results  Component Value Date   WBC 9.6 10/30/2022   HGB 12.8 (L) 10/30/2022   HCT 42.6 10/30/2022   MCV 85.5 10/30/2022   PLT 311 10/30/2022      Chemistry      Component Value Date/Time   NA 139 10/29/2022 0944   NA 141 10/29/2022 0944   NA 140 06/07/2021 0952   K 4.1 10/29/2022 0944   K 4.2 10/29/2022 0944   CL 103 10/29/2022 0944   CL 107 10/29/2022 0944   CO2 26 10/29/2022 0944   CO2 25 10/29/2022 0944   BUN 16 10/29/2022 0944   BUN 16 10/29/2022 0944   BUN 17 06/07/2021 0952   CREATININE 1.05 10/29/2022 0944   CREATININE 1.00 10/29/2022 0944   CREATININE 0.92 11/14/2016 1652      Component Value Date/Time   CALCIUM 8.6 (L) 10/29/2022 0944   CALCIUM 8.8 (L) 10/29/2022 0944   ALKPHOS 89 10/29/2022 0944   AST 17 10/29/2022 0944   ALT 17 10/29/2022 0944   BILITOT 0.5 10/29/2022 0944       RADIOGRAPHIC STUDIES: NM PET Image Initial (PI) Skull Base To Thigh (F-18 FDG)  Result Date: 11/21/2022 CLINICAL DATA:  Initial treatment strategy for non-small cell lung cancer. EXAM: NUCLEAR MEDICINE PET SKULL BASE TO THIGH TECHNIQUE: 10.13 mCi F-18 FDG was injected intravenously.  Full-ring PET imaging was performed from the skull base to thigh after the radiotracer. CT data was obtained and used for attenuation correction and anatomic localization. Fasting blood glucose: 98 mg/dl COMPARISON:  CT scan 10/28/2022 FINDINGS: Mediastinal blood pool activity: SUV max 2.05 Liver activity: SUV max NA NECK: No hypermetabolic lymph nodes in the neck. Incidental CT findings: 12 mm low-attenuation lesion associated with the left thyroid lobe does not show any hypermetabolism and is likely benign. No further imaging evaluation or follow-up is necessary. CHEST: Medial right upper lobe pulmonary nodule measuring 14 mm demonstrates hypermetabolism with SUV max of 4.17. Extensive right-sided paratracheal and hilar necrotic adenopathy with SUV max of 14.84. Moderate-sized right pleural effusion with high attenuation material demonstrating hypermetabolism.  This is likely pleural metastatic disease. The adjacent right ninth rib demonstrates cortical destruction of the deep cortex. SUV max is 6.12. Second hypermetabolic pleural nodule is noted more superiorly with SUV max of 4.42. No obvious pulmonary metastatic nodules. No chest wall mass is identified. Small, 6 mm left supraclavicular lymph node on image 70/3 has an SUV max of 2.26 and could be metastatic disease or reactive change. Incidental CT findings: Aortic and coronary artery calcifications. ABDOMEN/PELVIS: No evidence of hepatic or adrenal gland metastasis. Hypermetabolic lymph node between the celiac axis and the IVC measures 10 mm and has an SUV max of 5.03. Moderate scattered uptake in the colon stomach but no definite lesions. Incidental CT findings: Age advanced atherosclerotic calcifications involving the aorta and iliac arteries but no aneurysm. Surgical changes noted in the right abdomen. Small periumbilical abdominal wall hernia containing fat. SKELETON: Large destructive metastatic bone lesion involving the right iliac bone demonstrates  hypermetabolism with SUV max of 9.76. Right eleventh rib metastasis versus is adjacent implant. Incidental CT findings: None. IMPRESSION: 1. Hypermetabolic right upper lobe pulmonary lesion and adjacent extensive necrotic hypermetabolic mediastinal and right hilar adenopathy. 2. Right-sided pleural metastatic lesions in malignant right pleural effusion. 3. Osseous metastatic disease. 4. Single hypermetabolic retroperitoneal lymph node is likely metastatic. No hepatic or adrenal gland metastasis. 5. Small hypermetabolic left supraclavicular node could be reactive or metastatic disease. 6. Age advanced vascular disease. Electronically Signed   By: Marijo Sanes M.D.   On: 11/21/2022 17:19   CT BIOPSY  Result Date: 10/30/2022 INDICATION: Concern for metastatic lung cancer by imaging. Large right posterior iliac destructive bone lesion metastatic disease EXAM: CT-GUIDED BIOPSY RIGHT ILIAC BONE METASTASIS MEDICATIONS: % LIDOCAINE LOCAL ANESTHESIA/SEDATION: 1.0 mg IV Versed; 50 mcg IV Fentanyl Moderate Sedation Time:  10 minutes The patient was continuously monitored during the procedure by the interventional radiology nurse under my direct supervision. PROCEDURE: The procedure, risks, benefits, and alternatives were explained to the patient. Questions regarding the procedure were encouraged and answered. The patient understands and consents to the procedure. Previous imaging reviewed. Patient positioned prone. Noncontrast localization CT performed. The posterior right iliac destructive bone lesion with a large soft tissue component was localized and marked for a posterior approach. Under sterile conditions and local anesthesia, the 17 gauge 6.8 cm guide was advanced to the lesion. Needle position confirmed with CT. 318 gauge cores obtained through the access. Samples were intact and non fragmented. These were placed in formalin. Needle removed. Postprocedure imaging demonstrates no hemorrhage or hematoma. Patient  tolerated the procedure well without complication. Vital sign monitoring by nursing staff during the procedure will continue as patient is in the special procedures unit for post procedure observation. FINDINGS: The images document guide needle placement within the posterior right iliac bone lesion. Post biopsy images demonstrate no complicating feature. COMPLICATIONS: None immediate. IMPRESSION: Successful CT-guided core biopsy of the right iliac bone metastasis RADIATION DOSE REDUCTION: This exam was performed according to the departmental dose-optimization program which includes automated exposure control, adjustment of the mA and/or kV according to patient size and/or use of iterative reconstruction technique. Electronically Signed   By: Jerilynn Mages.  Shick M.D.   On: 10/30/2022 11:24   MR BRAIN WO CONTRAST  Result Date: 10/29/2022 CLINICAL DATA:  Non-small-cell lung cancer, staging EXAM: MRI HEAD WITHOUT CONTRAST TECHNIQUE: Multiplanar, multiecho pulse sequences of the brain and surrounding structures were obtained without intravenous contrast. COMPARISON:  MRI head 03/22/2020 FINDINGS: Brain: No evidence of metastatic disease on unenhanced imaging.  Note that intravenous contrast significantly increases sensitivity for metastatic disease. Ventricle size and cerebral volume normal. Negative for acute infarct or hemorrhage. Scattered T2 and FLAIR hyperintensities in the white matter similar to the prior study and most likely chronic microvascular ischemia. Vascular: Normal arterial flow voids Skull and upper cervical spine: No focal lesion. Sinuses/Orbits: Mild mucosal edema paranasal sinuses. Negative orbit Other: None IMPRESSION: 1. Negative for metastatic disease on unenhanced imaging. 2. Mild chronic microvascular ischemic change in the white matter. Electronically Signed   By: Franchot Gallo M.D.   On: 10/29/2022 11:49   CT Angio Chest/Abd/Pel for Dissection W and/or W/WO  Result Date: 10/28/2022 CLINICAL  DATA:  Aortic aneurysm suspected or dissection. EXAM: CT ANGIOGRAPHY CHEST, ABDOMEN AND PELVIS TECHNIQUE: Non-contrast CT of the chest was initially obtained. Multidetector CT imaging through the chest, abdomen and pelvis was performed using the standard protocol during bolus administration of intravenous contrast. Multiplanar reconstructed images and MIPs were obtained and reviewed to evaluate the vascular anatomy. RADIATION DOSE REDUCTION: This exam was performed according to the departmental dose-optimization program which includes automated exposure control, adjustment of the mA and/or kV according to patient size and/or use of iterative reconstruction technique. CONTRAST:  35mL OMNIPAQUE IOHEXOL 350 MG/ML SOLN COMPARISON:  CTA chest and subsequent abdomen and pelvis CT yesterday at 6:50 p.m., CT abdomen and pelvis with IV contrast 01/14/2021. FINDINGS: CTA CHEST FINDINGS Cardiovascular: The heart is slightly enlarged. There is a small anterior pericardial effusion. There are calcifications in the distal left main, proximal LAD and right coronary arteries. The pulmonary arteries and veins are normal in caliber except for again noted encasement and narrowing of the right upper lobe pulmonary artery by adenopathy. The pulmonary arteries are centrally clear. There is scattered aortic mixed plaque, scattered calcific plaques in the great vessels without stenosis, aneurysm or dissection. Mediastinum/Nodes: Right paratracheal nodal mass measures 4.5 x 3.3 cm on 7:49. A portion of this encases the right upper lobe pulmonary artery with narrowing. A prevascular lymph node right in front of the SVC measures 1.5 x 2.2 cm on 7:51. There is a right hilar lymph node just above the distal right main pulmonary artery measuring 2.4 cm in short axis on 7:61. There is a posterior right hilar node measuring 1.5 cm in short axis on 7:64 and a right mid hilar node of 1 cm in short axis on 7:77. Additional prevascular space node to  the right is 2.3 x 1.8 cm on 7:37. No thyroid or axillary mass is seen. The trachea is clear. The thoracic esophagus unremarkable. Lungs/Pleura: Small layering right pleural effusion possibly malignant. Nodular pleural thickening measuring 3.3 x 1.4 cm is noted posterior to the right hemidiaphragm on 7:117 most likely indicating pleural metastasis. Tumor extension into the chest wall is suspected through the right eighth intercostal space posteriorly and there is a rind of pleural thickening in this area as well (see series 7 axial images 93-101). There is a right upper lobe pleural-based pulmonary nodule anteromedially on 6:22 measuring 1.7 x 1.3 cm with small lobulations most likely a neoplasm and possibly the primary neoplasm. In the anterior basal segment of the right lower lobe there is a pleural-based nodule measuring 8 x 6 mm on 6:43. An additional pleural nodular focus suspected on 7:51 in the posterior right upper lung field. There is patchy ground-glass opacity posteriorly in the right upper lobe which could be due to pneumonitis or lymphangitic carcinomatosis. No other focal infiltrate or further nodules are seen. No  left pleural effusion. Musculoskeletal: There is erosion in the anterior aspect of the posterior right ninth rib where the pleural thickening extends through the eighth intercostal space consistent with early metastatic rib destruction. No other focal bone lesion is seen. There is no pathologic fracture. Review of the MIP images confirms the above findings. CTA ABDOMEN AND PELVIS FINDINGS VASCULAR Aorta: There is heavy mixed plaque. The aortic lumen is narrowed to 1 cm focally in the proximal infrarenal segment, otherwise does not show flow limiting stenosis. In the mid infrarenal aorta there is a small saccular aneurysm along the left wall measuring 9 mm at the base and 5 mm in height. There is no dissection. Celiac: There is 75% focal stenosis 1 cm distal to the vessel origin due to  compression by the median arcuate ligament of the diaphragm. The vessel otherwise opacifies well. SMA: No flow-limiting stenosis, aneurysm or dissection. Renals: Single left renal artery is widely patent. There are 2 right renal arteries, the larger of which supplies the upper to midpole, the smaller of which arises anterior to the larger artery and supplies the inferior pole. Both are widely patent. IMA: No flow-limiting stenosis. Inflow: There is mixed plaque in the common iliac arteries with up to 50% stenosis on the left, no stenosis on the right. The internal iliac arteries show patchy calcific plaques without stenosis. Both external iliac arteries are widely patent. Proximal outflow arteries: Small amount of nonstenosing calcification in the right common femoral artery. No other significant findings of the proximal outflow vessels. Veins: Patent portal vein.  Other veins are unopacified. Review of the MIP images confirms the above findings. NON-VASCULAR Hepatobiliary: The liver is 21 cm length with mild steatosis. There is a 1.2 cm cyst in the right lobe laterally. Subcentimeter hypodensity more inferiorly in the right lobe is too small to characterize but was present on both prior studies. No mass enhancement is seen. Gallbladder is distended, otherwise unremarkable no biliary dilatation. Pancreas: No abnormality. Spleen: No abnormality. Adrenals/Urinary Tract: There is no adrenal mass. There are small bilateral renal cysts and a few nonobstructive caliceal stones in the kidneys. Additional subcentimeter renal hypodensities are too small characterize but no follow-up is recommended. There is no hydronephrosis or obstructing stone. No bladder thickening is seen. Stomach/Bowel: Unremarkable stomach and small bowel, normal appendix. Moderate stool retention ascending colon. Left colonic diverticulosis without evidence of diverticulitis. Lymphatic: No appreciable adenopathy. Reproductive: Mildly enlarged but  poorly visualized prostate due to artifact from right hip replacement. Other: Right lower quadrant surgical clips. No free air, hemorrhage or free fluid. Small umbilical fat hernia. Musculoskeletal: Again noted is a destructive lesion of the medial right iliac crest measuring 5.6 x 5.1 cm on 7:235. There is ankylosis across the SI joints. Degenerative changes lumbar spine. Posterior endplate Schmorl's nodes L2-3. No other visible structure bone lesion. Review of the MIP images confirms the above findings. IMPRESSION: 1. Aortic and coronary artery atherosclerosis 2. Small anterior pericardial effusion.  Mild cardiomegaly. 3. No findings of acute aortic syndrome. 4. 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 5. 1.7 x 1.3 cm medial right upper lobe pleural-based nodule most likely a neoplasm and possibly the primary neoplasm. 6. Posterior segment right upper lobe ground-glass opacities consistent with pneumonitis or lymphangitic carcinomatosis. 7. Right hilar and mediastinal adenopathy with a 4.5 x 3.3 cm right paratracheal nodal mass encasing and narrowing the right upper lobe  pulmonary artery. 8. 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 9. Metastatic erosion of the anterior aspect of the posterior right ninth rib, and a 5.6 x 5.1 cm destructive lesion of the medial right iliac crest. 10. Nonobstructive nephrolithiasis. 11. Constipation and diverticulosis. 12. 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 13. Additional findings described above. Aortic Atherosclerosis (ICD10-I70.0). Electronically Signed   By: Telford Nab M.D.   On: 10/28/2022 06:37   CT Angio Abd/Pel W and/or Wo Contrast  Result  Date: 10/27/2022 CLINICAL DATA:  Mesenteric ischemia, chronic EXAM: CTA ABDOMEN AND PELVIS WITH CONTRAST TECHNIQUE: Multidetector CT imaging of the abdomen and pelvis was performed using the standard protocol during bolus administration of intravenous contrast. Multiplanar reconstructed images and MIPs were obtained and reviewed to evaluate the vascular anatomy. RADIATION DOSE REDUCTION: This exam was performed according to the departmental dose-optimization program which includes automated exposure control, adjustment of the mA and/or kV according to patient size and/or use of iterative reconstruction technique. CONTRAST:  18mL OMNIPAQUE IOHEXOL 350 MG/ML SOLN COMPARISON:  CT abdomen pelvis 10/27/2022 12:05 p.m., CT abdomen pelvis 01/14/2021 FINDINGS: VASCULAR Aorta: Slightly limited evaluation due to timing of contrast. Severe atherosclerotic plaque. Narrowing of the infrarenal abdominal aorta caliber down to 1.1 cm at the level of the kidneys. Otherwise normal aorta without aneurysm, dissection, vasculitis. Celiac: Patent without evidence of aneurysm, dissection, vasculitis or significant stenosis. SMA: Patent without evidence of aneurysm, dissection, vasculitis or significant stenosis. Renals: Both renal arteries are patent without evidence of aneurysm, dissection, vasculitis, fibromuscular dysplasia or significant stenosis. IMA: Patent without evidence of aneurysm, dissection, vasculitis or significant stenosis. Inflow: Patent without evidence of aneurysm, dissection, vasculitis or significant stenosis. Proximal Outflow: Bilateral common femoral and visualized portions of the superficial and profunda femoral arteries are patent without evidence of aneurysm, dissection, vasculitis or significant stenosis. Veins: No portal venous or mesenteric venous gas. The main portal, splenic, superior mesenteric veins are patent. Review of the MIP images confirms the above findings. NON-VASCULAR Lower chest: Please see  separately dictated CT angiography chest 10/27/2022. Hepatobiliary: There is a 1.2 cm right hepatic lobe fluid density lesion that likely represents a hepatic cyst. Subcentimeter hypodensity too small to characterize no gallstones, gallbladder wall thickening, or pericholecystic fluid. No biliary dilatation. Pancreas: No focal lesion. Normal pancreatic contour. No surrounding inflammatory changes. No main pancreatic ductal dilatation. Spleen: Normal in size without focal abnormality. Adrenals/Urinary Tract: No adrenal nodule bilaterally.  Fluid Bilateral kidneys enhance symmetrically. Density lesions within the kidneys likely represent simple renal cysts. Simple renal cysts, in the absence of clinically indicated signs/symptoms, require no independent follow-up. Subcentimeter hypodensities are too small to characterize-no further follow-up indicated. No hydronephrosis. No hydroureter. The urinary bladder is unremarkable. There is no urothelial wall thickening and there are no filling defects in the opacified portions of the bilateral collecting systems or ureters. Stomach/Bowel: Stomach is within normal limits. No evidence of bowel wall thickening or dilatation. Colonic diverticulosis. Appendix appears normal. Lymphatic: No lymphadenopathy. Reproductive: The prostate is enlarged measuring up to 5 cm. Other: Surgical clips noted within the right lower quadrant. No intraperitoneal free fluid. No intraperitoneal free gas. No organized fluid collection. Musculoskeletal: Tiny fat containing umbilical hernia. Destructive lytic lesion of the right iliac with associated 5.7 x 4.6 cm soft tissue density. No acute displaced fracture.  Multilevel degenerative changes of the spine. Total right hip arthroplasty. IMPRESSION: VASCULAR 1. Aortic Atherosclerosis (ICD10-I70.0) -severe leading to infrarenal abdominal aortic stenosis with caliber down to 1 cm. NON-VASCULAR 1. Colonic diverticulosis with no acute diverticulitis. 2.  Prostatomegaly. 3. Destructive lytic lesion of the right iliac with associated 5.7 x 4.6 cm soft tissue density. Finding concerning for malignancy/metastatic disease. 4. Please see separately CT angiography chest 10/27/2022 Electronically Signed   By: Iven Finn M.D.   On: 10/27/2022 20:10   CT Angio Chest PE W and/or Wo Contrast  Result Date: 10/27/2022 CLINICAL DATA:  Worsening right-sided pain, possible pulmonary embolus EXAM: CT ANGIOGRAPHY CHEST WITH CONTRAST TECHNIQUE: Multidetector CT imaging of the chest was performed using the standard protocol during bolus administration of intravenous contrast. Multiplanar CT image reconstructions and MIPs were obtained to evaluate the vascular anatomy. RADIATION DOSE REDUCTION: This exam was performed according to the departmental dose-optimization program which includes automated exposure control, adjustment of the mA and/or kV according to patient size and/or use of iterative reconstruction technique. CONTRAST:  63mL OMNIPAQUE IOHEXOL 350 MG/ML SOLN COMPARISON:  Chest radiograph 03/22/2020 FINDINGS: Cardiovascular: Reduced caliber of the right upper lobe pulmonary artery due to mass effect from surrounding tumor. No definite filling defect is identified in the pulmonary arterial tree to suggest pulmonary embolus. Coronary, aortic arch, and branch vessel atherosclerotic vascular disease. Mild cardiomegaly. Mediastinum/Nodes: Right paratracheal node/mass 3.1 cm in short axis on image 49 series 5. Prevascular node 1.6 cm in short axis, image 48 series 5. Right hilar lymph node 2.5 cm in short axis, image 62 series 5. Additional indistinct prevascular lymph nodes noted on the right. Lungs/Pleura: There is a rind of density along the posterior pleural margin along the right lower lobe, suspicious for pleural tumor, about 1.9 cm in thickness on image 100 series 5. Adjacent erosion of the right ninth rib on image 42 series 10, with suspected tumor extension into the  chest wall in the eighth intercostal space. Right upper lobe pulmonary nodule 1.8 by 1.2 cm on image 51 series 6. Right lower lobe pleural-based nodule along the major fissure, 0.9 by 0.5 cm on image 84 series 6. Nodularity along the pleural margin of the right posterior diaphragm measuring 2.7 by 1.1 cm on image 73 series 10. Airway thickening is present, suggesting bronchitis or reactive airways disease. Calcified granuloma in the superior segment right lower lobe (benign). Mild perihilar ground-glass opacity in the right upper lobe is indistinct and likely inflammatory on image 50 series 6. Upper Abdomen: Hypodense 1.5 cm lesion in the right hepatic lobe on image 111 of series 5, fluid density and most likely to be a cyst. Thoracic spondylosis. Musculoskeletal: As noted above, the pleural mass on the right is associated with a erosion of the top of the right ninth rib and extension into the right eighth intercostal space compatible chest wall invasion. Review of the MIP images confirms the above findings. IMPRESSION: 1. No filling defect in the pulmonary arterial tree to suggest pulmonary embolus. Reduced caliber of the right upper lobe pulmonary artery due to mass effect from the surrounding tumor. 2. Pleural mass along the right lower lobe posteriorly with erosion of the right ninth rib and extension into the right eighth intercostal space compatible with chest wall invasion. 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 3. Right upper lobe pulmonary nodule 1.8 by 1.2 cm, suspicious for malignancy. 4. Right hilar, right paratracheal, and prevascular adenopathy compatible with malignancy. 5. Airway thickening is  present, suggesting bronchitis or reactive airways disease. 6. Mild cardiomegaly. Coronary, aortic arch, and branch vessel atherosclerotic vascular disease. 7. Hypodense 1.5 cm lesion in the right hepatic lobe is fluid density and most likely to be a cyst. 8. Aortic atherosclerosis. 9. Mild  perihilar ground-glass opacity in the right upper lobe is indistinct and likely inflammatory. Aortic Atherosclerosis (ICD10-I70.0). Electronically Signed   By: Van Clines M.D.   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 Molecular studies by foundation 1 showed positive KRAS G12C mutation and negative PD-L1 expression I had a lengthy discussion with the patient and his family today about his current condition as well as treatment options.  I personally and independently reviewed the scan images and discussed the result and showed the images of the PET scan to the patient and his family. I explained to the patient that he has incurable condition and all the treatment will be of palliative nature.  I explained to the patient that surgical resection is not an option with his metastatic disease. I discussed with him the option of palliative care and hospice referral with an average survival of around 3-6 months versus consideration of systemic therapy with carboplatin, Alimta and Keytruda with expected survival of around 2 years.  I also explained to the patient that with the positive KRAS G12C mutation he could be a candidate for treatment with Alfred Levins (Adagrasib) or Lumakras (Sotorasib) as a second line treatment option if he has disease progression on the frontline chemotherapy with immunotherapy. I discussed with the patient the adverse effect of the chemotherapy including but not limited to alopecia, myelosuppression, nausea and vomiting, peripheral neuropathy, liver or renal  dysfunction as well as immunotherapy adverse effects.  He is interested in proceeding with the treatment and he will be treated with carboplatin for AUC of 5, Alimta 500 Mg/M2 and Keytruda 200 Mg IV every 3 weeks.  He is expected to start the first cycle of this treatment next week. The patient will receive vitamin B12 injection today. I will call his pharmacy with prescription for folic acid 1 mg p.o. daily. For the pain management he will continue with his current pain medication and he is followed by the palliative care team at the cancer center and he will need adjustment of his medication.  Will try to get the patient an earlier appointment with them for adjustment of his medications.  He is also scheduled to see a pain clinic person at [facility] for consideration of nerve block.  He may at some point benefit from palliative radiotherapy to the pleural-based right lung metastasis by Dr. Sondra Come. I will arrange for the patient to have a chemotherapy education class before the first dose of his treatment. The patient will come back for follow-up visit in 2 weeks for evaluation and management of any adverse effect of his treatment. The patient has a lot of questions today and I answered them completely to his satisfaction. He was advised to call immediately if he has any other concerning symptoms in the interval. The patient voices understanding of current disease status and treatment options and is in  agreement with the current care plan.  All questions were answered. The patient knows to call the clinic with any problems, questions or concerns. We can certainly see the patient much sooner if necessary.  The total time spent in the appointment was 60 minutes.  Disclaimer: This note was dictated with voice recognition software. Similar sounding words can inadvertently be transcribed and may not be corrected upon review.

## 2022-11-27 ENCOUNTER — Encounter: Payer: Self-pay | Admitting: Internal Medicine

## 2022-11-27 ENCOUNTER — Other Ambulatory Visit: Payer: Self-pay | Admitting: Nurse Practitioner

## 2022-11-27 MED ORDER — SENNA 8.6 MG PO TABS: 2.0000 | ORAL_TABLET | Freq: Two times a day (BID) | ORAL | 6 refills | Status: DC

## 2022-11-28 ENCOUNTER — Encounter: Payer: Self-pay | Admitting: Internal Medicine

## 2022-11-28 ENCOUNTER — Telehealth: Payer: Self-pay | Admitting: Medical Oncology

## 2022-11-28 ENCOUNTER — Telehealth: Payer: Self-pay

## 2022-11-28 ENCOUNTER — Other Ambulatory Visit: Payer: Self-pay | Admitting: Nurse Practitioner

## 2022-11-28 ENCOUNTER — Other Ambulatory Visit (HOSPITAL_COMMUNITY): Payer: Self-pay

## 2022-11-28 ENCOUNTER — Other Ambulatory Visit: Payer: Medicare Other

## 2022-11-28 DIAGNOSIS — M792 Neuralgia and neuritis, unspecified: Secondary | ICD-10-CM

## 2022-11-28 DIAGNOSIS — Z515 Encounter for palliative care: Secondary | ICD-10-CM

## 2022-11-28 DIAGNOSIS — C3490 Malignant neoplasm of unspecified part of unspecified bronchus or lung: Secondary | ICD-10-CM

## 2022-11-28 DIAGNOSIS — M898X5 Other specified disorders of bone, thigh: Secondary | ICD-10-CM

## 2022-11-28 DIAGNOSIS — G893 Neoplasm related pain (acute) (chronic): Secondary | ICD-10-CM

## 2022-11-28 MED ORDER — HYDROMORPHONE HCL 2 MG PO TABS: 2.0000 mg | ORAL_TABLET | ORAL | 0 refills | Status: DC | PRN

## 2022-11-28 NOTE — Telephone Encounter (Signed)
Pharmacy Patient Advocate Encounter   Received notification from [facility] that prior authorization for Praluent 150MG /ML is required/requested.    PA submitted on 1.12.24 to (ins) [employer] via CoverMyMeds Key [id number] Status is pending

## 2022-11-28 NOTE — Telephone Encounter (Signed)
Faxed [facility] report to [facility].

## 2022-11-28 NOTE — Telephone Encounter (Signed)
Pt sent in MyChart message and left a VM reporting that he was unable to refill his 2mg  tablets of hydromorphone. This RN and NP spoke with pt daughter and pt, confirmed he was taking the correct medications at the correct times. RN called the pharmacy, per pharmacy there was an extra order that was on hold for the 2mg  hydromorphone tabs, order canceled per Lexine Baton, NP, and correct order placed to [facility] on [address] (where medication was in stock). Information relayed to pt and family, no further questions or concerns at this time.

## 2022-12-01 ENCOUNTER — Other Ambulatory Visit (HOSPITAL_COMMUNITY): Payer: BLUE CROSS/BLUE SHIELD

## 2022-12-01 ENCOUNTER — Telehealth: Payer: Self-pay

## 2022-12-01 ENCOUNTER — Telehealth: Payer: Self-pay | Admitting: Cardiology

## 2022-12-01 ENCOUNTER — Other Ambulatory Visit (HOSPITAL_COMMUNITY): Payer: Self-pay

## 2022-12-01 ENCOUNTER — Telehealth: Payer: Self-pay | Admitting: Internal Medicine

## 2022-12-01 NOTE — Progress Notes (Deleted)
[facility] OFFICE PROGRESS NOTE  Pickenpack-Cousar, Carlena Sax, NP [place] [address]  DIAGNOSIS: ***  PRIOR THERAPY:  CURRENT THERAPY:  INTERVAL HISTORY: Thomas [name] 72 y.o. male returns for *** regular *** visit for followup of ***   MEDICAL HISTORY: Past Medical History:  Diagnosis Date   Abdominal wall pain 04/02/2021   Acute ischemic stroke ([place]) 03/22/2020   Avulsed toenail, initial encounter 05/04/2018   Dyspnea on exertion 02/10/2020   ED (erectile dysfunction)    Epidermoid cyst of neck 10/16/2017   Essential hypertension 07/21/2017   GERD (gastroesophageal reflux disease)    Gout of multiple sites 07/21/2017   History of kidney stones    History of squamous cell carcinoma excision    Hyperlipidemia    Hypertension not at goal 01/19/2014   It band syndrome, left 03/19/2020   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/24/2020   Left ureteral calculus    Lower abdominal pain 08/27/2015   Metatarsalgia of both feet 03/19/2020   Multiple joint pain 07/16/2018   Pain in joint, shoulder region 12/11/2015   Right flank pain 04/02/2021   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  MEDICATIONS:  Current Outpatient Medications  Medication Sig Dispense Refill   acetaminophen (TYLENOL) 650 MG CR tablet Take 1,300 mg by mouth in the morning and at bedtime.     Alirocumab (PRALUENT) 150 MG/ML SOAJ Inject 1 Pen into the skin every 14 (fourteen) days. (Patient taking differently: Inject 1 Pen into the skin every 14 (fourteen) days. Every other Sunday) 2 mL 11   allopurinol (ZYLOPRIM) 300 MG tablet Take 300 mg by mouth daily.     amLODipine (NORVASC) 5 MG tablet Take 1 tablet (5 mg total) by mouth daily. (Patient taking differently: Take 5 mg by mouth every evening.) 190 tablet 3    dexamethasone (DECADRON) 2 MG tablet Take 2 tablets (4mg ) for 7 days. Then decrease to 1 (2mg ) tablet for 7 days. Then decrease to 1 (2mg ) tablet every other day. 26 tablet 0   DULoxetine (CYMBALTA) 30 MG capsule Take 1 capsule (30 mg total) by mouth daily. 30 capsule 1   fentaNYL (DURAGESIC) 50 MCG/HR Place 1 patch onto the skin every 3 (three) days. 10 patch 0   folic acid (FOLVITE) 1 MG tablet Take 1 tablet (1 mg total) by mouth daily. 30 tablet 4   gabapentin (NEURONTIN) 300 MG capsule Take 900 mg by mouth at bedtime.     HYDROmorphone (DILAUDID) 2 MG tablet Take 1 tablet (2 mg total) by mouth every 4 (four) hours as needed for severe pain. In addition to one 4mg  tablet to total 6mg   as needed for severe pain. 90 tablet 0   HYDROmorphone (DILAUDID) 4 MG tablet Take 1 tablet (4 mg total) by mouth every 4 (four) hours as needed for severe pain. 90 tablet 0   ibuprofen (ADVIL) 200 MG tablet Take 600 mg by mouth every 8 (eight) hours as needed for moderate pain.     minoxidil (LONITEN) 10 MG tablet Take 1-1.5 tablets (10-15 mg total) by mouth 2 (two) times daily. TAKE 1 TABLET IN THE MORNING AND TAKE 1/2 TABLET AT NIGHT (Patient taking differently: Take 10 mg by mouth 2 (two) times daily.) 135 tablet 1   ondansetron (ZOFRAN-ODT) 4 MG  disintegrating tablet Take 1-2 tablets (4-8 mg total) by mouth every 6 (six) hours as needed for nausea or vomiting. 60 tablet 1   pantoprazole (PROTONIX) 40 MG tablet Take 1 tablet (40 mg total) by mouth 2 (two) times daily. 60 tablet 2   polyethylene glycol (MIRALAX / GLYCOLAX) 17 g packet Take 17 g by mouth 2 (two) times daily. 14 each 0   PRESCRIPTION MEDICATION Apply 1 Application topically at bedtime as needed (pain). Compounded pain cream ([facility])     senna (SENOKOT) 8.6 MG TABS tablet Take 2 tablets (17.2 mg total) by mouth 2 (two) times daily. 120 tablet 6   valsartan (DIOVAN) 320 MG tablet TAKE 1 TABLET BY MOUTH EVERY DAY (Patient taking  differently: Take 320 mg by mouth daily.) 90 tablet 3   No current facility-administered medications for this visit.    SURGICAL HISTORY:  Past Surgical History:  Procedure Laterality Date   COLONOSCOPY WITH PROPOFOL  2017   CYSTOSCOPY/RETROGRADE/URETEROSCOPY/STONE EXTRACTION WITH BASKET Left 04/16/2018   Procedure: CYSTOSCOPY/RETROGRADE/URETEROSCOPY/STONE EXTRACTION WITH BASKET/ HOLMIUM LASER LITHOTRIPSY/ STENT PLACEMENT;  Surgeon: Crist Fat, MD;  Location: [facility];  Service: Urology;  Laterality: Left;   HERNIA REPAIR Right 2022   inguinal   HOLMIUM LASER APPLICATION Left 04/16/2018   Procedure: HOLMIUM LASER APPLICATION;  Surgeon: Crist Fat, MD;  Location: [facility];  Service: Urology;  Laterality: Left;   TOTAL HIP ARTHROPLASTY Right 06/30/2022   Procedure: RIGHT TOTAL HIP ARTHROPLASTY ANTERIOR APPROACH;  Surgeon: Tarry Kos, MD;  Location: [facility] OR;  Service: Orthopedics;  Laterality: Right;  3-C    REVIEW OF SYSTEMS:   Review of Systems  Constitutional: Negative for appetite change, chills, fatigue, fever and unexpected weight change.  HENT:   Negative for mouth sores, nosebleeds, sore throat and trouble swallowing.   Eyes: Negative for eye problems and icterus.  Respiratory: Negative for cough, hemoptysis, shortness of breath and wheezing.   Cardiovascular: Negative for chest pain and leg swelling.  Gastrointestinal: Negative for abdominal pain, constipation, diarrhea, nausea and vomiting.  Genitourinary: Negative for bladder incontinence, difficulty urinating, dysuria, frequency and hematuria.   Musculoskeletal: Negative for back pain, gait problem, neck pain and neck stiffness.  Skin: Negative for itching and rash.  Neurological: Negative for dizziness, extremity weakness, gait problem, headaches, light-headedness and seizures.  Hematological: Negative for adenopathy. Does not bruise/bleed easily.  Psychiatric/Behavioral:  Negative for confusion, depression and sleep disturbance. The patient is not nervous/anxious.     PHYSICAL EXAMINATION:  There were no vitals taken for this visit.  ECOG PERFORMANCE STATUS: {CHL ONC ECOG [id number]  Physical Exam  Constitutional: Oriented to person, place, and time and well-developed, well-nourished, and in no distress. No distress.  HENT:  Head: Normocephalic and atraumatic.  Mouth/Throat: Oropharynx is clear and moist. No oropharyngeal exudate.  Eyes: Conjunctivae are normal. Right eye exhibits no discharge. Left eye exhibits no discharge. No scleral icterus.  Neck: Normal range of motion. Neck supple.  Cardiovascular: Normal rate, regular rhythm, normal heart sounds and intact distal pulses.   Pulmonary/Chest: Effort normal and breath sounds normal. No respiratory distress. No wheezes. No rales.  Abdominal: Soft. Bowel sounds are normal. Exhibits no distension and no mass. There is no tenderness.  Musculoskeletal: Normal range of motion. Exhibits no edema.  Lymphadenopathy:    No cervical adenopathy.  Neurological: Alert and oriented to person, place, and time. Exhibits normal muscle tone. Gait normal. Coordination normal.  Skin: Skin is warm  and dry. No rash noted. Not diaphoretic. No erythema. No pallor.  Psychiatric: Mood, memory and judgment normal.  Vitals reviewed.  LABORATORY DATA: Lab Results  Component Value Date   WBC 9.6 10/30/2022   HGB 12.8 (L) 10/30/2022   HCT 42.6 10/30/2022   MCV 85.5 10/30/2022   PLT 311 10/30/2022      Chemistry      Component Value Date/Time   NA 139 10/29/2022 0944   NA 141 10/29/2022 0944   NA 140 06/07/2021 0952   K 4.1 10/29/2022 0944   K 4.2 10/29/2022 0944   CL 103 10/29/2022 0944   CL 107 10/29/2022 0944   CO2 26 10/29/2022 0944   CO2 25 10/29/2022 0944   BUN 16 10/29/2022 0944   BUN 16 10/29/2022 0944   BUN 17 06/07/2021 0952   CREATININE 1.05 10/29/2022 0944   CREATININE 1.00 10/29/2022 0944    CREATININE 0.92 11/14/2016 1652      Component Value Date/Time   CALCIUM 8.6 (L) 10/29/2022 0944   CALCIUM 8.8 (L) 10/29/2022 0944   ALKPHOS 89 10/29/2022 0944   AST 17 10/29/2022 0944   ALT 17 10/29/2022 0944   BILITOT 0.5 10/29/2022 0944       RADIOGRAPHIC STUDIES:  NM PET Image Initial (PI) Skull Base To Thigh (F-18 FDG)  Result Date: 11/21/2022 CLINICAL DATA:  Initial treatment strategy for non-small cell lung cancer. EXAM: NUCLEAR MEDICINE PET SKULL BASE TO THIGH TECHNIQUE: 10.13 mCi F-18 FDG was injected intravenously. Full-ring PET imaging was performed from the skull base to thigh after the radiotracer. CT data was obtained and used for attenuation correction and anatomic localization. Fasting blood glucose: 98 mg/dl COMPARISON:  CT scan 63/21/1269 FINDINGS: Mediastinal blood pool activity: SUV max 2.05 Liver activity: SUV max NA NECK: No hypermetabolic lymph nodes in the neck. Incidental CT findings: 12 mm low-attenuation lesion associated with the left thyroid lobe does not show any hypermetabolism and is likely benign. No further imaging evaluation or follow-up is necessary. CHEST: Medial right upper lobe pulmonary nodule measuring 14 mm demonstrates hypermetabolism with SUV max of 4.17. Extensive right-sided paratracheal and hilar necrotic adenopathy with SUV max of 14.84. Moderate-sized right pleural effusion with high attenuation material demonstrating hypermetabolism. This is likely pleural metastatic disease. The adjacent right ninth rib demonstrates cortical destruction of the deep cortex. SUV max is 6.12. Second hypermetabolic pleural nodule is noted more superiorly with SUV max of 4.42. No obvious pulmonary metastatic nodules. No chest wall mass is identified. Small, 6 mm left supraclavicular lymph node on image 70/3 has an SUV max of 2.26 and could be metastatic disease or reactive change. Incidental CT findings: Aortic and coronary artery calcifications. ABDOMEN/PELVIS: No  evidence of hepatic or adrenal gland metastasis. Hypermetabolic lymph node between the celiac axis and the IVC measures 10 mm and has an SUV max of 5.03. Moderate scattered uptake in the colon stomach but no definite lesions. Incidental CT findings: Age advanced atherosclerotic calcifications involving the aorta and iliac arteries but no aneurysm. Surgical changes noted in the right abdomen. Small periumbilical abdominal wall hernia containing fat. SKELETON: Large destructive metastatic bone lesion involving the right iliac bone demonstrates hypermetabolism with SUV max of 9.76. Right eleventh rib metastasis versus is adjacent implant. Incidental CT findings: None. IMPRESSION: 1. Hypermetabolic right upper lobe pulmonary lesion and adjacent extensive necrotic hypermetabolic mediastinal and right hilar adenopathy. 2. Right-sided pleural metastatic lesions in malignant right pleural effusion. 3. Osseous metastatic disease. 4. Single hypermetabolic retroperitoneal lymph  node is likely metastatic. No hepatic or adrenal gland metastasis. 5. Small hypermetabolic left supraclavicular node could be reactive or metastatic disease. 6. Age advanced vascular disease. Electronically Signed   By: Rudie Meyer M.D.   On: 11/21/2022 17:19     ASSESSMENT/PLAN:  No problem-specific Assessment & Plan notes found for this encounter.   No orders of the defined types were placed in this encounter.    I spent {CHL ONC TIME VISIT - ZWACA:[id number]} counseling the patient face to face. The total time spent in the appointment was {CHL ONC TIME VISIT - IJAZM:[id number]}.  Emmah Bratcher L Caedan Sumler, PA-C 12/01/22

## 2022-12-01 NOTE — Telephone Encounter (Signed)
Called patient regarding upcoming appointments, patient is notified. 

## 2022-12-01 NOTE — Telephone Encounter (Signed)
Pharmacy Patient Advocate Encounter  Prior Authorization for Repatha 140mg /ml sureclick has been approved.    KEY: [id number] Effective dates: 1.15.24 through 1.15.25

## 2022-12-01 NOTE — Telephone Encounter (Signed)
Pt c/o medication issue:  1. Name of Medication: repatha sure click  2. How are you currently taking this medication (dosage and times per day)?   3. Are you having a reaction (difficulty breathing--STAT)? no  4. What is your medication issue? Levonne Lapping with [facility] Medicaid calling to inform the medicare part d approved the medication and the effective date is today 1/152024 to 1/152025. She says the member was notified and if there are any questions their number is: [phone] option 5

## 2022-12-01 NOTE — Telephone Encounter (Signed)
Pharmacy Patient Advocate Encounter   Received notification from [facility] that prior authorization for Repatha 140mg /ml sureclick  is required/requested.    PA submitted on 1.15.24 to (ins) [employer] via CoverMyMeds Key [id number] Status is pending

## 2022-12-01 NOTE — Telephone Encounter (Signed)
Please see that ''Repatha 140 mg/ml sureclick'' has been prior authoization has been approved and will need an active script from here.

## 2022-12-01 NOTE — Telephone Encounter (Signed)
Pharmacy Patient Advocate Encounter  Received notification from [facility] that the request for prior authorization for Praluent 150mg /ml has been denied due to    However, ''Repatha 140mg  sureclick'' has been submitted to insurance as an formulary alternative medication. Currently awaiting response

## 2022-12-02 ENCOUNTER — Telehealth: Payer: Self-pay | Admitting: Medical Oncology

## 2022-12-02 ENCOUNTER — Encounter: Payer: Self-pay | Admitting: Internal Medicine

## 2022-12-02 ENCOUNTER — Other Ambulatory Visit: Payer: Medicare Other

## 2022-12-02 MED ORDER — REPATHA SURECLICK 140 MG/ML ~~LOC~~ SOAJ: 1.0000 mL | SUBCUTANEOUS | 11 refills | Status: DC

## 2022-12-02 MED FILL — Fosaprepitant Dimeglumine For IV Infusion 150 MG (Base Eq): INTRAVENOUS | Qty: 5 | Status: AC

## 2022-12-02 MED FILL — Dexamethasone Sodium Phosphate Inj 100 MG/10ML: INTRAMUSCULAR | Qty: 1 | Status: AC

## 2022-12-02 NOTE — Addendum Note (Signed)
Addended by: Malena Peer D on: 12/02/2022 09:06 AM   Modules accepted: Orders

## 2022-12-02 NOTE — Telephone Encounter (Signed)
Patient made aware that preferred PCSK9i is now Repatha. Patient ok with change. Rx sent to pharmacy.

## 2022-12-02 NOTE — Telephone Encounter (Signed)
Message sent to pt education providers.

## 2022-12-02 NOTE — Telephone Encounter (Signed)
Thomas [name] did not attend an eduction session. He would like someone to talk to him and his wife about expectations , side effects, etc. . He also has some anxiety about his increased symptoms. He feels that the can wait until tomorrow to address with [facility] and education nurse. Marland Kitchen

## 2022-12-02 NOTE — Telephone Encounter (Addendum)
FYI-appt tomorrow.  Pain in mid back getting worse in chest up high. More congestion.  Recently getting more short of breath x 2-3 days." I am  having localized pain , up in front above solar plexus ,extending into neck. Worse at hight". Sleeps upright and sleeps ok . Uses C-PAP.  The decadron has been decreased to 1 pill qod.

## 2022-12-03 ENCOUNTER — Other Ambulatory Visit: Payer: Self-pay | Admitting: Medical Oncology

## 2022-12-03 ENCOUNTER — Inpatient Hospital Stay: Payer: Medicare Other

## 2022-12-03 ENCOUNTER — Inpatient Hospital Stay: Payer: Medicare Other | Admitting: Physician Assistant

## 2022-12-03 ENCOUNTER — Inpatient Hospital Stay (HOSPITAL_BASED_OUTPATIENT_CLINIC_OR_DEPARTMENT_OTHER): Payer: Medicare Other | Admitting: Nurse Practitioner

## 2022-12-03 VITALS — BP 170/67 | HR 67 | Temp 98.0°F | Resp 17 | Wt 199.6 lb

## 2022-12-03 DIAGNOSIS — M792 Neuralgia and neuritis, unspecified: Secondary | ICD-10-CM

## 2022-12-03 DIAGNOSIS — K5900 Constipation, unspecified: Secondary | ICD-10-CM | POA: Diagnosis not present

## 2022-12-03 DIAGNOSIS — C3491 Malignant neoplasm of unspecified part of right bronchus or lung: Secondary | ICD-10-CM

## 2022-12-03 DIAGNOSIS — C3490 Malignant neoplasm of unspecified part of unspecified bronchus or lung: Secondary | ICD-10-CM

## 2022-12-03 DIAGNOSIS — G893 Neoplasm related pain (acute) (chronic): Secondary | ICD-10-CM | POA: Diagnosis not present

## 2022-12-03 DIAGNOSIS — Z515 Encounter for palliative care: Secondary | ICD-10-CM

## 2022-12-03 DIAGNOSIS — R530 Neoplastic (malignant) related fatigue: Secondary | ICD-10-CM

## 2022-12-03 DIAGNOSIS — C3411 Malignant neoplasm of upper lobe, right bronchus or lung: Secondary | ICD-10-CM | POA: Diagnosis not present

## 2022-12-03 DIAGNOSIS — F419 Anxiety disorder, unspecified: Secondary | ICD-10-CM

## 2022-12-03 LAB — CBC WITH DIFFERENTIAL (CANCER CENTER ONLY)
Abs Immature Granulocytes: 0.03 10*3/uL (ref 0.00–0.07)
Basophils Absolute: 0 10*3/uL (ref 0.0–0.1)
Basophils Relative: 0 %
Eosinophils Absolute: 0.1 10*3/uL (ref 0.0–0.5)
Eosinophils Relative: 1 %
HCT: 36.2 % — ABNORMAL LOW (ref 39.0–52.0)
Hemoglobin: 11.6 g/dL — ABNORMAL LOW (ref 13.0–17.0)
Immature Granulocytes: 0 %
Lymphocytes Relative: 10 %
Lymphs Abs: 0.8 10*3/uL (ref 0.7–4.0)
MCH: 26.4 pg (ref 26.0–34.0)
MCHC: 32 g/dL (ref 30.0–36.0)
MCV: 82.5 fL (ref 80.0–100.0)
Monocytes Absolute: 0.7 10*3/uL (ref 0.1–1.0)
Monocytes Relative: 9 %
Neutro Abs: 6.2 10*3/uL (ref 1.7–7.7)
Neutrophils Relative %: 80 %
Platelet Count: 223 10*3/uL (ref 150–400)
RBC: 4.39 MIL/uL (ref 4.22–5.81)
RDW: 16.3 % — ABNORMAL HIGH (ref 11.5–15.5)
WBC Count: 7.7 10*3/uL (ref 4.0–10.5)
nRBC: 0 % (ref 0.0–0.2)

## 2022-12-03 LAB — CMP (CANCER CENTER ONLY)
ALT: 14 U/L (ref 0–44)
AST: 11 U/L — ABNORMAL LOW (ref 15–41)
Albumin: 3.9 g/dL (ref 3.5–5.0)
Alkaline Phosphatase: 79 U/L (ref 38–126)
Anion gap: 7 (ref 5–15)
BUN: 16 mg/dL (ref 8–23)
CO2: 28 mmol/L (ref 22–32)
Calcium: 9.1 mg/dL (ref 8.9–10.3)
Chloride: 104 mmol/L (ref 98–111)
Creatinine: 0.9 mg/dL (ref 0.61–1.24)
GFR, Estimated: >60 mL/min (ref >60)
Glucose, Bld: 80 mg/dL (ref 70–99)
Potassium: 3.7 mmol/L (ref 3.5–5.1)
Sodium: 139 mmol/L (ref 135–145)
Total Bilirubin: 0.4 mg/dL (ref 0.3–1.2)
Total Protein: 6.4 g/dL — ABNORMAL LOW (ref 6.5–8.1)

## 2022-12-03 LAB — TSH: TSH: 1.266 u[IU]/mL (ref 0.350–4.500)

## 2022-12-03 MED ORDER — SODIUM CHLORIDE 0.9 % IV SOLN
10.0000 mg | Freq: Once | INTRAVENOUS | Status: AC
  Administered 2022-12-03: 10 mg via INTRAVENOUS
  Filled 2022-12-03: qty 10

## 2022-12-03 MED ORDER — PALONOSETRON HCL INJECTION 0.25 MG/5ML
0.2500 mg | Freq: Once | INTRAVENOUS | Status: AC
  Administered 2022-12-03: 0.25 mg via INTRAVENOUS
  Filled 2022-12-03: qty 5

## 2022-12-03 MED ORDER — SODIUM CHLORIDE 0.9 % IV SOLN
500.0000 mg/m2 | Freq: Once | INTRAVENOUS | Status: AC
  Administered 2022-12-03: 1100 mg via INTRAVENOUS
  Filled 2022-12-03: qty 40

## 2022-12-03 MED ORDER — PROCHLORPERAZINE MALEATE 10 MG PO TABS: 10.0000 mg | ORAL_TABLET | Freq: Four times a day (QID) | ORAL | 0 refills | Status: DC | PRN

## 2022-12-03 MED ORDER — SODIUM CHLORIDE 0.9 % IV SOLN
150.0000 mg | Freq: Once | INTRAVENOUS | Status: AC
  Administered 2022-12-03: 150 mg via INTRAVENOUS
  Filled 2022-12-03: qty 150

## 2022-12-03 MED ORDER — SODIUM CHLORIDE 0.9 % IV SOLN: Freq: Once | INTRAVENOUS | Status: AC

## 2022-12-03 MED ORDER — SODIUM CHLORIDE 0.9 % IV SOLN
572.5000 mg | Freq: Once | INTRAVENOUS | Status: AC
  Administered 2022-12-03: 550 mg via INTRAVENOUS
  Filled 2022-12-03: qty 55

## 2022-12-03 MED ORDER — SODIUM CHLORIDE 0.9 % IV SOLN
200.0000 mg | Freq: Once | INTRAVENOUS | Status: AC
  Administered 2022-12-03: 200 mg via INTRAVENOUS
  Filled 2022-12-03: qty 200

## 2022-12-03 NOTE — Progress Notes (Signed)
Palliative Medicine [facility]  [phone]) [phone] [phone]) [phone]   Name: Thomas [name] Date: 12/03/2022 MRN: [id number]  DOB: Jun 27, 1951  Patient Care Team: Glee Arvin, NP as PCP - General (Nurse Practitioner) Georgeanna Lea, MD as PCP - Cardiology (Cardiology) Pickenpack-Cousar, Arty Baumgartner, NP as Nurse Practitioner (Nurse Practitioner)     INTERVAL HISTORY: Thomas [name] is a 72 y.o. male with oncologic medical history including right iliac lytic lesion, right upper lob nodule (pending pathology), right paratracheal nodule, and right hilar and mediastinal adenopathy. He is currently undergoing radiation to right iliac and right rib. Pending additional oncology interventions based on pathology report.  Palliative ask to see for symptom management and goals of care.   SOCIAL HISTORY:     reports that he quit smoking about 28 years ago. His smoking use included cigarettes. He has a 6.00 pack-year smoking history. He has never used smokeless tobacco. He reports that he does not currently use alcohol. He reports that he does not use drugs.  ADVANCE DIRECTIVES:    CODE STATUS:   PAST MEDICAL HISTORY: Past Medical History:  Diagnosis Date   Abdominal wall pain 04/02/2021   Acute ischemic stroke (HCC) 03/22/2020   Avulsed toenail, initial encounter 05/04/2018   Dyspnea on exertion 02/10/2020   ED (erectile dysfunction)    Epidermoid cyst of neck 10/16/2017   Essential hypertension 07/21/2017   GERD (gastroesophageal reflux disease)    Gout of multiple sites 07/21/2017   History of kidney stones    History of squamous cell carcinoma excision    Hyperlipidemia    Hypertension not at goal 01/19/2014   It band syndrome, left 03/19/2020   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/24/2020   Left ureteral calculus    Lower abdominal pain 08/27/2015   Metatarsalgia of both feet 03/19/2020   Multiple joint pain 07/16/2018    Pain in joint, shoulder region 12/11/2015   Right flank pain 04/02/2021   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  MEDICATIONS:  Current Outpatient Medications  Medication Sig Dispense Refill   acetaminophen (TYLENOL) 650 MG CR tablet Take 1,300 mg by mouth in the morning and at bedtime.     allopurinol (ZYLOPRIM) 300 MG tablet Take 300 mg by mouth daily.     amLODipine (NORVASC) 5 MG tablet Take 1 tablet (5 mg total) by mouth daily. (Patient taking differently: Take 5 mg by mouth every evening.) 190 tablet 3   dexamethasone (DECADRON) 2 MG tablet Take 2 tablets (4mg ) for 7 days. Then decrease to 1 (2mg ) tablet for 7 days. Then decrease to 1 (2mg ) tablet every other day. 26 tablet 0   DULoxetine (CYMBALTA) 30 MG capsule Take 1 capsule (30 mg total) by mouth daily. 30 capsule 1   Evolocumab (REPATHA SURECLICK) 140 MG/ML SOAJ Inject 140 mg into the skin every 14 (fourteen) days. 2 mL 11   fentaNYL (DURAGESIC) 50 MCG/HR Place 1 patch onto the skin every 3 (three) days. 10 patch 0   folic acid (FOLVITE) 1 MG tablet Take 1 tablet (1 mg total) by mouth daily. 30 tablet 4   gabapentin (NEURONTIN) 300 MG capsule Take 900 mg by mouth at bedtime.     HYDROmorphone (DILAUDID) 2 MG tablet Take 1 tablet (2 mg total) by mouth every 4 (four) hours  as needed for severe pain. In addition to one 4mg  tablet to total 6mg   as needed for severe pain. 90 tablet 0   HYDROmorphone (DILAUDID) 4 MG tablet Take 1 tablet (4 mg total) by mouth every 4 (four) hours as needed for severe pain. 90 tablet 0   ibuprofen (ADVIL) 200 MG tablet Take 600 mg by mouth every 8 (eight) hours as needed for moderate pain.     minoxidil (LONITEN) 10 MG tablet Take 1-1.5 tablets (10-15 mg total) by mouth 2 (two) times daily. TAKE 1 TABLET IN THE MORNING AND TAKE 1/2 TABLET AT NIGHT (Patient taking  differently: Take 10 mg by mouth 2 (two) times daily.) 135 tablet 1   ondansetron (ZOFRAN-ODT) 4 MG disintegrating tablet Take 1-2 tablets (4-8 mg total) by mouth every 6 (six) hours as needed for nausea or vomiting. 60 tablet 1   pantoprazole (PROTONIX) 40 MG tablet Take 1 tablet (40 mg total) by mouth 2 (two) times daily. 60 tablet 2   polyethylene glycol (MIRALAX / GLYCOLAX) 17 g packet Take 17 g by mouth 2 (two) times daily. 14 each 0   PRESCRIPTION MEDICATION Apply 1 Application topically at bedtime as needed (pain). Compounded pain cream ([facility])     senna (SENOKOT) 8.6 MG TABS tablet Take 2 tablets (17.2 mg total) by mouth 2 (two) times daily. 120 tablet 6   valsartan (DIOVAN) 320 MG tablet TAKE 1 TABLET BY MOUTH EVERY DAY (Patient taking differently: Take 320 mg by mouth daily.) 90 tablet 3   No current facility-administered medications for this visit.    VITAL SIGNS: There were no vitals taken for this visit. There were no vitals filed for this visit.  Estimated body mass index is 27.92 kg/m as calculated from the following:   Height as of 11/12/22: 5\' 11"  (1.803 m).   Weight as of 11/26/22: 200 lb 3 oz (90.8 kg).   PERFORMANCE STATUS (ECOG) : 1 - Symptomatic but completely ambulatory  Physical Assessment NAD, resting comfortably in recliner Normal breathing pattern  AAO x3, mood appropriate     IMPRESSION: I saw Thomas [name] during his infusion today. He is tolerating initial treatment without difficulties. His wife is at chair side. No acute distress noted. Patient shares he is spending most of his time in the home due to pain and discomfort. Appetite is good. Denies nausea, vomiting, constipation, or diarrhea.   Neoplasm related pain  Thomas [name] continues to endorse ongoing pain in right hip, flank, right chest wall, and back area. During previous discussions he expressed his wishes would be for pain goal of a 4 out of 10. He is able to continue working from home at  this time. Does spend majority of his days at home due to discomfort and recent diagnosis of cancer.   Over the past week he is reporting increased right chest wall discomfort, congestion, with some shortness of breath. Does note some improvement today. Shares anxiety around his symptoms and the what "ifs". Encouraged patient the medical team is available and will continue to closely monitor and support him.   We reviewed at length current regimen: hydromorphone 6 mg every 4 hours for breakthrough pain, fentanyl 50 mcg patch, robaxin as needed for muscle spasm, gabapentin 900 mg at bedtime. He recently completed course of dexamethasone which he reports additional relief. He understands given start of treatment this will not be continued. We also attempted to start patient on Cymbalta at lowest dose of 20 mg to further  assist in pain management as well as mood. Unfortunately patient self-discontinued as he did not appreciate how it made him feel. He is aware we will not initiate other medications for anxiety at this time.   Thomas [name] reports his pain continues to escalate at times despite current regimen. He has been instructed to increase hydromorphone to a range of 6-8mg  as needed for severe pain. Education provided on appropriateness of when to administer 6 mg versus 8mg . No changes in other medications.   Patient has signed pain contract on file. Of note I have completed extensive pain review including past contractes and electronic documents from Dr. Wyline Mood ([facility]) and Dr. Warnell Forester [facility]). Thomas [name] has received nerve blocks in the past and was on a lengthy course of prednisone which he endorses some improvement for his pain at that time. 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   We will continue to closely monitor and make safe adjustments as needed.   Constipation  Controlled.   I discussed the importance of continued conversation with family and  their medical providers regarding overall plan of care and treatment options, ensuring decisions are within the context of the patients values and GOCs.  PLAN: Fentanyl 50 mcg patch.   Hydromorphone 6 to 8 mg as needed for breakthrough pain. Is taking 6mg  around the clock. Education provided on range and how to differentiate dose needed.  Gabapentin 900 mg at bedtime. Does not feel any relief when taking 300 mg twice daily in addition to night dose. Zofran 4-8 mg as needed for nausea   MiraLAX/senna daily for bowel regimen We will continue to closely monitor and adjust medications as needed. I will plan to see patient back in 2-3 weeks. Phone follow-up on Monday. Sooner if needed.    Patient expressed understanding and was in agreement with this plan. He also understands that He can call the clinic at any time with any questions, concerns, or complaints.     Any controlled substances utilized were prescribed in the context of palliative care. PDMP has been reviewed.   Time Total: 45 min   Visit consisted of counseling and education dealing with the complex and emotionally intense issues of symptom management and palliative care in the setting of serious and potentially life-threatening illness.Greater than 50%  of this time was spent counseling and coordinating care related to the above assessment and plan.  Thomas [name], AGPCNP-BC  Palliative Medicine Team/[place]

## 2022-12-03 NOTE — Patient Instructions (Signed)
[facility]ICAL [facility]COL[facility]  Discharge Instructions: Thank you for choosing [facility] to provide your oncology and hematology care.   If you have a lab appointment with the Cancer Center, please go directly to the Cancer Center and check in at the registration area.   Wear comfortable clothing and clothing appropriate for easy access to any Portacath or PICC line.   We strive to give you quality time with your provider. You may need to reschedule your appointment if you arrive late (15 or more minutes).  Arriving late affects you and other patients whose appointments are after yours.  Also, if you miss three or more appointments without notifying the office, you may be dismissed from the clinic at the provider's discretion.      For prescription refill requests, have your pharmacy contact our office and allow 72 hours for refills to be completed.    Today you received the following chemotherapy and/or immunotherapy agents: Keytruda, alimta, carboplatin      To help prevent nausea and vomiting after your treatment, we encourage you to take your nausea medication as directed.  BELOW ARE SYMPTOMS THAT SHOULD BE REPORTED IMMEDIATELY: *FEVER GREATER THAN 100.4 F (38 C) OR HIGHER *CHILLS OR SWEATING *NAUSEA AND VOMITING THAT IS NOT CONTROLLED WITH YOUR NAUSEA MEDICATION *UNUSUAL SHORTNESS OF BREATH *UNUSUAL BRUISING OR BLEEDING *URINARY PROBLEMS (pain or burning when urinating, or frequent urination) *BOWEL PROBLEMS (unusual diarrhea, constipation, pain near the anus) TENDERNESS IN MOUTH AND THROAT WITH OR WITHOUT PRESENCE OF ULCERS (sore throat, sores in mouth, or a toothache) UNUSUAL RASH, SWELLING OR PAIN  UNUSUAL VAGINAL DISCHARGE OR ITCHING   Items with * indicate a potential emergency and should be followed up as soon as possible or go to the Emergency Department if any problems should occur.  Please show the CHEMOTHERAPY ALERT CARD or IMMUNOTHERAPY ALERT  CARD at check-in to the Emergency Department and triage nurse.  Should you have questions after your visit or need to cancel or reschedule your appointment, please contact [facility]  Dept: [phone]  and follow the prompts.  Office hours are 8:00 a.m. to 4:30 p.m. Monday - Friday. Please note that voicemails left after 4:00 p.m. may not be returned until the following business day.  We are closed weekends and major holidays. You have access to a nurse at all times for urgent questions. Please call the main number to the clinic Dept: [phone] and follow the prompts.   For any non-urgent questions, you may also contact your provider using MyChart. We now offer e-Visits for anyone 51 and older to request care online for non-urgent symptoms. For details visit [email or web address].   Also download the MyChart app! Go to the app store, search "MyChart", open the app, select [facility], and log in with your MyChart username and password.  Pembrolizumab Injection What is this medication? PEMBROLIZUMAB (PEM broe LIZ ue mab) treats some types of cancer. It works by helping your immune system slow or stop the spread of cancer cells. It is a monoclonal antibody. This medicine may be used for other purposes; ask your health care provider or pharmacist if you have questions. COMMON BRAND NAME(S): Keytruda What should I tell my care team before I take this medication? They need to know if you have any of these conditions: Allogeneic stem cell transplant (uses someone else's stem cells) Autoimmune diseases, such as Crohn disease, ulcerative colitis, lupus History of chest radiation Nervous system problems,  such as Guillain-Barre syndrome, myasthenia gravis Organ transplant An unusual or allergic reaction to pembrolizumab, other medications, foods, dyes, or preservatives Pregnant or trying to get pregnant Breast-feeding How should I use this medication? This  medication is injected into a vein. It is given by your care team in a hospital or clinic setting. A special MedGuide will be given to you before each treatment. Be sure to read this information carefully each time. Talk to your care team about the use of this medication in children. While it may be prescribed for children as young as 6 months for selected conditions, precautions do apply. Overdosage: If you think you have taken too much of this medicine contact a poison control center or emergency room at once. NOTE: This medicine is only for you. Do not share this medicine with others. What if I miss a dose? Keep appointments for follow-up doses. It is important not to miss your dose. Call your care team if you are unable to keep an appointment. What may interact with this medication? Interactions have not been studied. This list may not describe all possible interactions. Give your health care provider a list of all the medicines, herbs, non-prescription drugs, or dietary supplements you use. Also tell them if you smoke, drink alcohol, or use illegal drugs. Some items may interact with your medicine. What should I watch for while using this medication? Your condition will be monitored carefully while you are receiving this medication. You may need blood work while taking this medication. This medication may cause serious skin reactions. They can happen weeks to months after starting the medication. Contact your care team right away if you notice fevers or flu-like symptoms with a rash. The rash may be red or purple and then turn into blisters or peeling of the skin. You may also notice a red rash with swelling of the face, lips, or lymph nodes in your neck or under your arms. Tell your care team right away if you have any change in your eyesight. Talk to your care team if you may be pregnant. Serious birth defects can occur if you take this medication during pregnancy and for 4 months after the last  dose. You will need a negative pregnancy test before starting this medication. Contraception is recommended while taking this medication and for 4 months after the last dose. Your care team can help you find the option that works for you. Do not breastfeed while taking this medication and for 4 months after the last dose. What side effects may I notice from receiving this medication? Side effects that you should report to your care team as soon as possible: Allergic reactions--skin rash, itching, hives, swelling of the face, lips, tongue, or throat Dry cough, shortness of breath or trouble breathing Eye pain, redness, irritation, or discharge with blurry or decreased vision Heart muscle inflammation--unusual weakness or fatigue, shortness of breath, chest pain, fast or irregular heartbeat, dizziness, swelling of the ankles, feet, or hands Hormone gland problems--headache, sensitivity to light, unusual weakness or fatigue, dizziness, fast or irregular heartbeat, increased sensitivity to cold or heat, excessive sweating, constipation, hair loss, increased thirst or amount of urine, tremors or shaking, irritability Infusion reactions--chest pain, shortness of breath or trouble breathing, feeling faint or lightheaded Kidney injury (glomerulonephritis)--decrease in the amount of urine, red or dark brown urine, foamy or bubbly urine, swelling of the ankles, hands, or feet Liver injury--right upper belly pain, loss of appetite, nausea, light-colored stool, dark yellow or brown urine,  yellowing skin or eyes, unusual weakness or fatigue Pain, tingling, or numbness in the hands or feet, muscle weakness, change in vision, confusion or trouble speaking, loss of balance or coordination, trouble walking, seizures Rash, fever, and swollen lymph nodes Redness, blistering, peeling, or loosening of the skin, including inside the mouth Sudden or severe stomach pain, bloody diarrhea, fever, nausea, vomiting Side effects  that usually do not require medical attention (report to your care team if they continue or are bothersome): Bone, joint, or muscle pain Diarrhea Fatigue Loss of appetite Nausea Skin rash This list may not describe all possible side effects. Call your doctor for medical advice about side effects. You may report side effects to FDA at 1-800-FDA-1088. Where should I keep my medication? This medication is given in a hospital or clinic. It will not be stored at home. NOTE: This sheet is a summary. It may not cover all possible information. If you have questions about this medicine, talk to your doctor, pharmacist, or health care provider.  2023 Elsevier/Gold Standard (2013-07-25 00:00:00)  Pemetrexed Injection What is this medication? PEMETREXED (PEM e TREX ed) treats some types of cancer. It works by slowing down the growth of cancer cells. This medicine may be used for other purposes; ask your health care provider or pharmacist if you have questions. COMMON BRAND NAME(S): Alimta, PEMFEXY What should I tell my care team before I take this medication? They need to know if you have any of these conditions: Infection, such as chickenpox, cold sores, or herpes Kidney disease Low blood cell levels (white cells, red cells, and platelets) Lung or breathing disease, such as asthma Radiation therapy An unusual or allergic reaction to pemetrexed, other medications, foods, dyes, or preservatives If you or your partner are pregnant or trying to get pregnant Breast-feeding How should I use this medication? This medication is injected into a vein. It is given by your care team in a hospital or clinic setting. Talk to your care team about the use of this medication in children. Special care may be needed. Overdosage: If you think you have taken too much of this medicine contact a poison control center or emergency room at once. NOTE: This medicine is only for you. Do not share this medicine with  others. What if I miss a dose? Keep appointments for follow-up doses. It is important not to miss your dose. Call your care team if you are unable to keep an appointment. What may interact with this medication? Do not take this medication with any of the following: Live virus vaccines This medication may also interact with the following: Ibuprofen This list may not describe all possible interactions. Give your health care provider a list of all the medicines, herbs, non-prescription drugs, or dietary supplements you use. Also tell them if you smoke, drink alcohol, or use illegal drugs. Some items may interact with your medicine. What should I watch for while using this medication? Your condition will be monitored carefully while you are receiving this medication. This medication may make you feel generally unwell. This is not uncommon as chemotherapy can affect healthy cells as well as cancer cells. Report any side effects. Continue your course of treatment even though you feel ill unless your care team tells you to stop. This medication can cause serious side effects. To reduce the risk, your care team may give you other medications to take before receiving this one. Be sure to follow the directions from your care team. This medication can cause  a rash or redness in areas of the body that have previously had radiation therapy. If you have had radiation therapy, tell your care team if you notice a rash in this area. This medication may increase your risk of getting an infection. Call your care team for advice if you get a fever, chills, sore throat, or other symptoms of a cold or flu. Do not treat yourself. Try to avoid being around people who are sick. Be careful brushing or flossing your teeth or using a toothpick because you may get an infection or bleed more easily. If you have any dental work done, tell your dentist you are receiving this medication. Avoid taking medications that contain  aspirin, acetaminophen, ibuprofen, naproxen, or ketoprofen unless instructed by your care team. These medications may hide a fever. Check with your care team if you have severe diarrhea, nausea, and vomiting, or if you sweat a lot. The loss of too much body fluid may make it dangerous for you to take this medication. Talk to your care team if you or your partner wish to become pregnant or think either of you might be pregnant. This medication can cause serious birth defects if taken during pregnancy and for 6 months after the last dose. A negative pregnancy test is required before starting this medication. A reliable form of contraception is recommended while taking this medication and for 6 months after the last dose. Talk to your care team about reliable forms of contraception. Do not father a child while taking this medication and for 3 months after the last dose. Use a condom while having sex during this time period. Do not breastfeed while taking this medication and for 1 week after the last dose. This medication may cause infertility. Talk to your care team if you are concerned about your fertility. What side effects may I notice from receiving this medication? Side effects that you should report to your care team as soon as possible: Allergic reactions--skin rash, itching, hives, swelling of the face, lips, tongue, or throat Dry cough, shortness of breath or trouble breathing Infection--fever, chills, cough, sore throat, wounds that don't heal, pain or trouble when passing urine, general feeling of discomfort or being unwell Kidney injury--decrease in the amount of urine, swelling of the ankles, hands, or feet Low red blood cell level--unusual weakness or fatigue, dizziness, headache, trouble breathing Redness, blistering, peeling, or loosening of the skin, including inside the mouth Unusual bruising or bleeding Side effects that usually do not require medical attention (report to your care team  if they continue or are bothersome): Fatigue Loss of appetite Nausea Vomiting This list may not describe all possible side effects. Call your doctor for medical advice about side effects. You may report side effects to FDA at 1-800-FDA-1088. Where should I keep my medication? This medication is given in a hospital or clinic. It will not be stored at home. NOTE: This sheet is a summary. It may not cover all possible information. If you have questions about this medicine, talk to your doctor, pharmacist, or health care provider.  2023 Elsevier/Gold Standard (2022-03-10 00:00:00) Carboplatin Injection What is this medication? CARBOPLATIN (KAR boe pla tin) treats some types of cancer. It works by slowing down the growth of cancer cells. This medicine may be used for other purposes; ask your health care provider or pharmacist if you have questions. COMMON BRAND NAME(S): Paraplatin What should I tell my care team before I take this medication? They need to know if you  have any of these conditions: Blood disorders Hearing problems Kidney disease Recent or ongoing radiation therapy An unusual or allergic reaction to carboplatin, cisplatin, other medications, foods, dyes, or preservatives Pregnant or trying to get pregnant Breast-feeding How should I use this medication? This medication is injected into a vein. It is given by your care team in a hospital or clinic setting. Talk to your care team about the use of this medication in children. Special care may be needed. Overdosage: If you think you have taken too much of this medicine contact a poison control center or emergency room at once. NOTE: This medicine is only for you. Do not share this medicine with others. What if I miss a dose? Keep appointments for follow-up doses. It is important not to miss your dose. Call your care team if you are unable to keep an appointment. What may interact with this medication? Medications for  seizures Some antibiotics, such as amikacin, gentamicin, neomycin, streptomycin, tobramycin Vaccines This list may not describe all possible interactions. Give your health care provider a list of all the medicines, herbs, non-prescription drugs, or dietary supplements you use. Also tell them if you smoke, drink alcohol, or use illegal drugs. Some items may interact with your medicine. What should I watch for while using this medication? Your condition will be monitored carefully while you are receiving this medication. You may need blood work while taking this medication. This medication may make you feel generally unwell. This is not uncommon, as chemotherapy can affect healthy cells as well as cancer cells. Report any side effects. Continue your course of treatment even though you feel ill unless your care team tells you to stop. In some cases, you may be given additional medications to help with side effects. Follow all directions for their use. This medication may increase your risk of getting an infection. Call your care team for advice if you get a fever, chills, sore throat, or other symptoms of a cold or flu. Do not treat yourself. Try to avoid being around people who are sick. Avoid taking medications that contain aspirin, acetaminophen, ibuprofen, naproxen, or ketoprofen unless instructed by your care team. These medications may hide a fever. Be careful brushing or flossing your teeth or using a toothpick because you may get an infection or bleed more easily. If you have any dental work done, tell your dentist you are receiving this medication. Talk to your care team if you wish to become pregnant or think you might be pregnant. This medication can cause serious birth defects. Talk to your care team about effective forms of contraception. Do not breast-feed while taking this medication. What side effects may I notice from receiving this medication? Side effects that you should report to your  care team as soon as possible: Allergic reactions--skin rash, itching, hives, swelling of the face, lips, tongue, or throat Infection--fever, chills, cough, sore throat, wounds that don't heal, pain or trouble when passing urine, general feeling of discomfort or being unwell Low red blood cell level--unusual weakness or fatigue, dizziness, headache, trouble breathing Pain, tingling, or numbness in the hands or feet, muscle weakness, change in vision, confusion or trouble speaking, loss of balance or coordination, trouble walking, seizures Unusual bruising or bleeding Side effects that usually do not require medical attention (report to your care team if they continue or are bothersome): Hair loss Nausea Unusual weakness or fatigue Vomiting This list may not describe all possible side effects. Call your doctor for medical advice about  side effects. You may report side effects to FDA at 1-800-FDA-1088. Where should I keep my medication? This medication is given in a hospital or clinic. It will not be stored at home. NOTE: This sheet is a summary. It may not cover all possible information. If you have questions about this medicine, talk to your doctor, pharmacist, or health care provider.  2023 Elsevier/Gold Standard (2022-02-17 00:00:00)

## 2022-12-04 ENCOUNTER — Telehealth: Payer: Self-pay

## 2022-12-04 ENCOUNTER — Encounter: Payer: Self-pay | Admitting: Nurse Practitioner

## 2022-12-04 ENCOUNTER — Encounter: Payer: Self-pay | Admitting: Internal Medicine

## 2022-12-04 DIAGNOSIS — C7951 Secondary malignant neoplasm of bone: Secondary | ICD-10-CM | POA: Diagnosis not present

## 2022-12-04 DIAGNOSIS — C3411 Malignant neoplasm of upper lobe, right bronchus or lung: Secondary | ICD-10-CM | POA: Diagnosis not present

## 2022-12-04 LAB — T4: T4, Total: 7.8 ug/dL (ref 4.5–12.0)

## 2022-12-04 NOTE — Progress Notes (Signed)
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  Treatment sites- Right chest and Right pelvis. Patient completed treatment on   Does the patient complain of any of the following: Pain:*** Shortness of breath w/wo exertion: *** Cough: *** Hemoptysis: *** Pain with swallowing: *** Swallowing/choking concerns: *** Appetite: *** Energy Level: *** Urinary/bowel issues Post radiation skin Changes: ***    Additional comments if applicable:

## 2022-12-04 NOTE — Telephone Encounter (Signed)
Thomas [name] states that he is doing fine.  He is eating, drinking and urinating well. He knows to call the office at [phone] if he has any questions or concerns.

## 2022-12-04 NOTE — Telephone Encounter (Signed)
-----  Message from Frutoso Schatz, RN sent at 12/03/2022  3:43 PM EST ----- Regarding: follow up 1st time keytruda, alimta, carboplatin

## 2022-12-08 ENCOUNTER — Inpatient Hospital Stay (HOSPITAL_BASED_OUTPATIENT_CLINIC_OR_DEPARTMENT_OTHER): Payer: Medicare Other | Admitting: Nurse Practitioner

## 2022-12-08 ENCOUNTER — Encounter: Payer: Self-pay | Admitting: Nurse Practitioner

## 2022-12-08 ENCOUNTER — Other Ambulatory Visit (HOSPITAL_COMMUNITY): Payer: Self-pay

## 2022-12-08 DIAGNOSIS — M898X5 Other specified disorders of bone, thigh: Secondary | ICD-10-CM | POA: Diagnosis not present

## 2022-12-08 DIAGNOSIS — G893 Neoplasm related pain (acute) (chronic): Secondary | ICD-10-CM

## 2022-12-08 DIAGNOSIS — R197 Diarrhea, unspecified: Secondary | ICD-10-CM

## 2022-12-08 DIAGNOSIS — Z515 Encounter for palliative care: Secondary | ICD-10-CM

## 2022-12-08 DIAGNOSIS — R530 Neoplastic (malignant) related fatigue: Secondary | ICD-10-CM

## 2022-12-08 DIAGNOSIS — C3490 Malignant neoplasm of unspecified part of unspecified bronchus or lung: Secondary | ICD-10-CM

## 2022-12-08 MED ORDER — HYDROMORPHONE HCL 8 MG PO TABS
8.0000 mg | ORAL_TABLET | ORAL | 0 refills | Status: DC | PRN
  Filled 2022-12-08: qty 90, 15d supply, fill #0

## 2022-12-08 NOTE — Progress Notes (Signed)
Palliative Medicine [facility]  [phone] [phone]   Name: Thomas [name] Date: 12/08/2022 MRN: [id number]  DOB: 1951-07-26  Patient Care Team: Glee Arvin, NP as PCP - General (Nurse Practitioner) Georgeanna Lea, MD as PCP - Cardiology (Cardiology) Pickenpack-Cousar, Arty Baumgartner, NP as Nurse Practitioner (Nurse Practitioner)   I connected with Melchor Amour on 12/08/22 at 10:30 AM EST by phone and verified that I am speaking with the correct person using two identifiers.   I discussed the limitations, risks, security and privacy concerns of performing an evaluation and management service by telemedicine and the availability of in-person appointments. I also discussed with the patient that there may be a patient responsible charge related to this service. The patient expressed understanding and agreed to proceed.   Other persons participating in the visit and their role in the encounter: [place], RN   Patient's location: home   Provider's location: [facility]   Chief Complaint: f/u of pain and symptom management      INTERVAL HISTORY: Thomas [name] is a 72 y.o. male with oncologic medical history including right iliac lytic lesion, right upper lob nodule (pending pathology), right paratracheal nodule, and right hilar and mediastinal adenopathy. He is currently undergoing radiation to right iliac and right rib. Pending additional oncology interventions based on pathology report.  Palliative ask to see for symptom management and goals of care.   SOCIAL HISTORY:     reports that he quit smoking about 28 years ago. His smoking use included cigarettes. He has a 6.00 pack-year smoking history. He has never used smokeless tobacco. He reports that he does not currently use alcohol. He reports that he does not use drugs.  ADVANCE DIRECTIVES:    CODE STATUS:   PAST MEDICAL HISTORY: Past Medical History:  Diagnosis Date    Abdominal wall pain 04/02/2021   Acute ischemic stroke (HCC) 03/22/2020   Avulsed toenail, initial encounter 05/04/2018   Dyspnea on exertion 02/10/2020   ED (erectile dysfunction)    Epidermoid cyst of neck 10/16/2017   Essential hypertension 07/21/2017   GERD (gastroesophageal reflux disease)    Gout of multiple sites 07/21/2017   History of kidney stones    History of squamous cell carcinoma excision    Hyperlipidemia    Hypertension not at goal 01/19/2014   It band syndrome, left 03/19/2020   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/24/2020   Left ureteral calculus    Lower abdominal pain 08/27/2015   Metatarsalgia of both feet 03/19/2020   Multiple joint pain 07/16/2018   Pain in joint, shoulder region 12/11/2015   Right flank pain 04/02/2021   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  MEDICATIONS:  Current Outpatient Medications  Medication Sig Dispense Refill   acetaminophen (TYLENOL) 650 MG CR tablet Take 1,300 mg by mouth in the morning and at bedtime.     allopurinol (ZYLOPRIM) 300 MG tablet Take 300 mg by mouth daily.     amLODipine (NORVASC) 5 MG tablet Take 1 tablet (5 mg total) by mouth daily. (Patient taking differently: Take 5 mg by mouth every evening.) 190 tablet 3   dexamethasone (DECADRON) 2 MG tablet Take 2 tablets (4mg ) for 7 days. Then decrease to 1 (2mg ) tablet for 7 days. Then decrease to 1 (2mg ) tablet every other  day. 26 tablet 0   DULoxetine (CYMBALTA) 30 MG capsule Take 1 capsule (30 mg total) by mouth daily. 30 capsule 1   Evolocumab (REPATHA SURECLICK) 161 MG/ML SOAJ Inject 140 mg into the skin every 14 (fourteen) days. 2 mL 11   fentaNYL (DURAGESIC) 50 MCG/HR Place 1 patch onto the skin every 3 (three) days. 10 patch 0   folic acid (FOLVITE) 1 MG tablet Take 1 tablet (1 mg total) by mouth daily. 30  tablet 4   gabapentin (NEURONTIN) 300 MG capsule Take 900 mg by mouth at bedtime.     HYDROmorphone (DILAUDID) 2 MG tablet Take 1 tablet (2 mg total) by mouth every 4 (four) hours as needed for severe pain. In addition to one 4mg  tablet to total 6mg   as needed for severe pain. 90 tablet 0   HYDROmorphone (DILAUDID) 4 MG tablet Take 1 tablet (4 mg total) by mouth every 4 (four) hours as needed for severe pain. 90 tablet 0   ibuprofen (ADVIL) 200 MG tablet Take 600 mg by mouth every 8 (eight) hours as needed for moderate pain.     methocarbamol (ROBAXIN) 750 MG tablet Take 750 mg by mouth every 8 (eight) hours as needed for muscle spasms.     minoxidil (LONITEN) 10 MG tablet Take 1-1.5 tablets (10-15 mg total) by mouth 2 (two) times daily. TAKE 1 TABLET IN THE MORNING AND TAKE 1/2 TABLET AT NIGHT (Patient taking differently: Take 10 mg by mouth 2 (two) times daily.) 135 tablet 1   ondansetron (ZOFRAN-ODT) 4 MG disintegrating tablet Take 1-2 tablets (4-8 mg total) by mouth every 6 (six) hours as needed for nausea or vomiting. 60 tablet 1   pantoprazole (PROTONIX) 40 MG tablet Take 1 tablet (40 mg total) by mouth 2 (two) times daily. 60 tablet 2   polyethylene glycol (MIRALAX / GLYCOLAX) 17 g packet Take 17 g by mouth 2 (two) times daily. 14 each 0   PRESCRIPTION MEDICATION Apply 1 Application topically at bedtime as needed (pain). Compounded pain cream ([facility])     prochlorperazine (COMPAZINE) 10 MG tablet Take 1 tablet (10 mg total) by mouth every 6 (six) hours as needed for nausea or vomiting. 30 tablet 0   senna (SENOKOT) 8.6 MG TABS tablet Take 2 tablets (17.2 mg total) by mouth 2 (two) times daily. 120 tablet 6   valsartan (DIOVAN) 320 MG tablet TAKE 1 TABLET BY MOUTH EVERY DAY (Patient taking differently: Take 320 mg by mouth daily.) 90 tablet 3   No current facility-administered medications for this visit.    VITAL SIGNS: There were no vitals taken for this visit. There were no  vitals filed for this visit.  Estimated body mass index is 27.84 kg/m as calculated from the following:   Height as of 11/12/22: 5\' 11"  (1.803 m).   Weight as of 12/03/22: 199 lb 10 oz (90.5 kg).   PERFORMANCE STATUS (ECOG) : 1 - Symptomatic but completely ambulatory   IMPRESSION:  I connected with Mr. Schlosser by phone for symptom management follow-up. He completed his first cycle on 1/17. Unfortunately patient states over the weekend he has experienced increase in fatigue, diarrhea, and occasional nausea. He is taking zofran as needed. Reports has not had to take this much. He has not taken anything for his loose stools. Is pushing fluids and trying to eat.   I recommended nutritional support of BRAT diet for the next 24 hours in addition to Immodium for better bowel control.  Patient verbalized understanding.   Neoplasm related pain  Witt shares some improvement in pain with recent increase of hydromorphone to 8mg  (increased from 6mg ). He does endorse chest wall discomfort. Has been taking Claritin as recommended with improvement of allergy symptoms. Denies significant cough, congestion, or shortness of breath. Advised if symptoms worsen to reach out to medical team.   We have reviewed current regimen: hydromorphone 8mg  every 4 hours as needed for breakthrough pain and fentanyl 50 mcg patch. He should also be taking gabapentin 900 mg at bedtime. We previously attempted Cymbalta at lowest dose of 20 mg to further assist in pain management as well as mood. Unfortunately patient self-discontinued as he did not appreciate how it made him feel.  Given recent changes to medication regimen and start of new treatment. No additional changes to be made at this time. Education provided that pain should improve now that he has started treatment. I will be reluctant to make changes to fentanyl patch early on in treatment course as to not overdo medications. Hopeful with treatment symptoms will decrease  allowing for necessary adjustments.   Patient has signed pain contract on file. Of note I have completed extensive pain review including past contractes and electronic documents from Dr. Wyline Mood ([facility]) and Dr. Warnell Forester [facility]). Chucky has received nerve blocks in the past and was on a lengthy course of prednisone which he endorses some improvement for his pain at that time. 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   We will continue to closely monitor and make safe adjustments as needed.   I discussed the importance of continued conversation with family and their medical providers regarding overall plan of care and treatment options, ensuring decisions are within the context of the patients values and GOCs.  PLAN: Fentanyl 50 mcg patch.   Hydromorphone 8 mg as needed for breakthrough pain.  Gabapentin 900 mg at bedtime. Does not feel any relief when taking 300 mg twice daily in addition to night dose. Zofran 4-8 mg as needed for nausea   MiraLAX/senna daily for bowel regimen (on hold due to loose stools) We will continue to closely monitor and adjust medications as needed. I will plan to see patient back later this week for close management.    Patient expressed understanding and was in agreement with this plan. He also understands that He can call the clinic at any time with any questions, concerns, or complaints.       Any controlled substances utilized were prescribed in the context of palliative care. PDMP has been reviewed.    Time Total: 20 min   Visit consisted of counseling and education dealing with the complex and emotionally intense issues of symptom management and palliative care in the setting of serious and potentially life-threatening illness.Greater than 50%  of this time was spent counseling and coordinating care related to the above assessment and plan.  Alda Lea, AGPCNP-BC  Palliative Medicine Team/[place]  [place]

## 2022-12-08 NOTE — Progress Notes (Unsigned)
[facility] OFFICE PROGRESS NOTE  Thomas [name], Thomas Baumgartner, NP [address] [place] [address]  DIAGNOSIS: Stage IV (T1b, N3, M1 C) non-small cell lung cancer, adenocarcinoma presented with right upper lobe lung nodule in addition to mediastinal and right hilar adenopathy as well as right-sided pleural metastatic disease with malignant right pleural effusion and multiple osseous metastatic lesions and single hypermetabolic retroperitoneal lymph node diagnosed in December 2023   Biomarker Findings Microsatellite status - MS-Stable Tumor Mutational Burden - 2 Muts/Mb Genomic Findings For a complete list of the genes assayed, please refer to the Appendix. KRAS G12C KEAP1 splice site 640-1G>A MTAP loss CDKN2A/B CDKN2A loss, CDKN2B loss MDM4 S367L - subclonal? 7 Disease relevant genes with no reportable alterations: ALK, BRAF, EGFR, ERBB2, MET, RET, ROS1   PDL1 Expression: 0%   PRIOR THERAPY: Status post palliative radiotherapy to the metastatic disease in the right hip area under the care of Dr. Roselind Messier   CURRENT THERAPY: Systemic chemotherapy with carboplatin for AUC of 5, Alimta 500 Mg/M2 and Keytruda 200 Mg IV every 3 weeks. First dose December 03, 2022. Status post 1 cycle.   INTERVAL HISTORY: Thomas [name] 72 y.o. male returns to the clinic today for a follow-up visit accompanied by his wife.  The patient was unfortunately recently diagnosed with stage IV lung cancer.  The patient completed palliative radiation to care of Dr. Roselind Messier to the right hip area.  He still is having significant bone pain for which he is following with palliative care.  The patient is on 8 mg of Dilaudid every 4 hours, 900 mg of gabapentin at bedtime, and fentanyl patches 50 mcg.  Of note the patient did recently see Dr. Abbey Chatters at [facility] on 12/04/2021.  He also had an appointment at the [facility] pain clinic on 12/09/2022.  He did cancel his appointment because he was not sure if the nerve block  was going to be effective for his pain.    Regarding his systemic treatment, the patient underwent cycle #1 of carboplatin, Alimta, and Keytruda.  He overall tolerated it fair except for some nausea and loose stool.  Of note, the patient was taking MiraLAX and senna for bowel prophylaxis.  He had at the most 2 loose stools per day.  He started taking Imodium 1 to 2 days ago which has helped.  He also had only been taking his Zofran for nausea.  He is going to try to alternate with Compazine moving forward..  Today he denies any fever, chills, or night sweats.  His weight is stable.  He reports baseline dyspnea on exertion which may be a little bit worse secondary to pain.  He denies any cough, or hemoptysis. He continues to have right-sided rib pain secondary to his metastatic bone lesions.   Denies any rashes or skin changes except worsening dry skin. He sometimes has headaches.  His staging brain MRI was negative for metastatic disease to the brain. The patient is here today for evaluation and 1 week follow-up visit to manage any adverse side effects of treatment.        MEDICAL HISTORY: Past Medical History:  Diagnosis Date   Abdominal wall pain 04/02/2021   Acute ischemic stroke (HCC) 03/22/2020   Avulsed toenail, initial encounter 05/04/2018   Dyspnea on exertion 02/10/2020   ED (erectile dysfunction)    Epidermoid cyst of neck 10/16/2017   Essential hypertension 07/21/2017   GERD (gastroesophageal reflux disease)    Gout of multiple  sites 07/21/2017   History of kidney stones    History of radiation therapy    Right Chest, Right Pelvis- 10/30/22-11/13/22- Thomas [name]   History of squamous cell carcinoma excision    Hyperlipidemia    Hypertension not at goal 01/19/2014   It band syndrome, left 03/19/2020   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/24/2020   Left ureteral calculus    Lower abdominal pain 08/27/2015   Metatarsalgia of both feet 03/19/2020   Multiple  joint pain 07/16/2018   Pain in joint, shoulder region 12/11/2015   Right flank pain 04/02/2021   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  MEDICATIONS:  Current Outpatient Medications  Medication Sig Dispense Refill   acetaminophen (TYLENOL) 650 MG CR tablet Take 1,300 mg by mouth in the morning and at bedtime.     allopurinol (ZYLOPRIM) 300 MG tablet Take 300 mg by mouth daily.     amLODipine (NORVASC) 5 MG tablet Take 1 tablet (5 mg total) by mouth daily. (Patient taking differently: Take 5 mg by mouth every evening.) 190 tablet 3   Evolocumab (REPATHA SURECLICK) 811 MG/ML SOAJ Inject 140 mg into the skin every 14 (fourteen) days. 2 mL 11   folic acid (FOLVITE) 1 MG tablet Take 1 tablet (1 mg total) by mouth daily. 30 tablet 4   gabapentin (NEURONTIN) 300 MG capsule Take 900 mg by mouth at bedtime.     HYDROmorphone (DILAUDID) 8 MG tablet Take 1 tablet (8 mg total) by mouth every 4 (four) hours as needed for severe pain. 90 tablet 0   minoxidil (LONITEN) 10 MG tablet Take 1-1.5 tablets (10-15 mg total) by mouth 2 (two) times daily. TAKE 1 TABLET IN THE MORNING AND TAKE 1/2 TABLET AT NIGHT (Patient taking differently: Take 10 mg by mouth 2 (two) times daily.) 135 tablet 1   ondansetron (ZOFRAN-ODT) 4 MG disintegrating tablet Take 1-2 tablets (4-8 mg total) by mouth every 6 (six) hours as needed for nausea or vomiting. 60 tablet 1   pantoprazole (PROTONIX) 40 MG tablet Take 1 tablet (40 mg total) by mouth 2 (two) times daily. 60 tablet 2   polyethylene glycol (MIRALAX / GLYCOLAX) 17 g packet Take 17 g by mouth 2 (two) times daily. 14 each 0   PRESCRIPTION MEDICATION Apply 1 Application topically at bedtime as needed (pain). Compounded pain cream ([facility])     prochlorperazine (COMPAZINE) 10 MG tablet Take 1 tablet (10 mg total) by mouth  every 6 (six) hours as needed for nausea or vomiting. 30 tablet 0   senna (SENOKOT) 8.6 MG TABS tablet Take 2 tablets (17.2 mg total) by mouth 2 (two) times daily. 120 tablet 6   valsartan (DIOVAN) 320 MG tablet TAKE 1 TABLET BY MOUTH EVERY DAY (Patient taking differently: Take 320 mg by mouth daily.) 90 tablet 3   dexamethasone (DECADRON) 2 MG tablet Take 2 tablets (4mg ) for 7 days. Then decrease to 1 (2mg ) tablet for 7 days. Then decrease to 1 (2mg ) tablet every other day. 26 tablet 0   DULoxetine (CYMBALTA) 30 MG capsule Take 1 capsule (30 mg total) by mouth daily. (Patient not taking: Reported on 12/10/2022) 30 capsule 1   [START ON 12/12/2022] fentaNYL (DURAGESIC) 50 MCG/HR Place 1 patch onto the skin every 3 (three) days. 10 patch 0   ibuprofen (  ADVIL) 200 MG tablet Take 600 mg by mouth every 8 (eight) hours as needed for moderate pain.     methocarbamol (ROBAXIN) 750 MG tablet Take 750 mg by mouth every 8 (eight) hours as needed for muscle spasms.     No current facility-administered medications for this visit.    SURGICAL HISTORY:  Past Surgical History:  Procedure Laterality Date   COLONOSCOPY WITH PROPOFOL  2017   CYSTOSCOPY/RETROGRADE/URETEROSCOPY/STONE EXTRACTION WITH BASKET Left 04/16/2018   Procedure: CYSTOSCOPY/RETROGRADE/URETEROSCOPY/STONE EXTRACTION WITH BASKET/ HOLMIUM LASER LITHOTRIPSY/ STENT PLACEMENT;  Surgeon: Thomas Fat, MD;  Location: [facility];  Service: Urology;  Laterality: Left;   HERNIA REPAIR Right 2022   inguinal   HOLMIUM LASER APPLICATION Left 04/16/2018   Procedure: HOLMIUM LASER APPLICATION;  Surgeon: Thomas Fat, MD;  Location: [facility];  Service: Urology;  Laterality: Left;   TOTAL HIP ARTHROPLASTY Right 06/30/2022   Procedure: RIGHT TOTAL HIP ARTHROPLASTY ANTERIOR APPROACH;  Surgeon: Thomas Kos, MD;  Location: [facility] OR;  Service: Orthopedics;  Laterality: Right;  3-C    REVIEW OF SYSTEMS:   Review of  Systems  Constitutional: Also for fatigue.  Negative for appetite change, chills, fever and unexpected weight change.  HENT: Negative for mouth sores, nosebleeds, sore throat and trouble swallowing.   Eyes: Negative for eye problems and icterus.  Respiratory: Positive for dyspnea on exertion.  Negative for cough, hemoptysis, and wheezing.   Cardiovascular: Positive for right-sided rib pain.  Negative for leg swelling.  Gastrointestinal: Positive for nausea and some diarrhea.  Negative for abdominal pain, constipation, and vomiting.  Genitourinary: Negative for bladder incontinence, difficulty urinating, dysuria, frequency and hematuria.   Musculoskeletal: Positive for myalgias.  Positive for arthralgias.  Negative for gait problem, neck pain and neck stiffness.  Skin: Positive for dry skin. Neurological: Negative for dizziness, extremity weakness, gait problem, headaches, light-headedness and seizures.  Hematological: Negative for adenopathy. Does not bruise/bleed easily.  Psychiatric/Behavioral: Negative for confusion, depression and sleep disturbance. The patient is not nervous/anxious.     PHYSICAL EXAMINATION:  Weight 199 lb 9.6 oz (90.5 kg).  ECOG PERFORMANCE STATUS: 1  Physical Exam  Constitutional: Oriented to person, place, and time and well-developed, well-nourished, and in no distress.  HENT:  Head: Normocephalic and atraumatic.  Mouth/Throat: Oropharynx is clear and moist. No oropharyngeal exudate.  Eyes: Conjunctivae are normal. Right eye exhibits no discharge. Left eye exhibits no discharge. No scleral icterus.  Neck: Normal range of motion. Neck supple.  Cardiovascular: Normal rate, regular rhythm, normal heart sounds and intact distal pulses.   Pulmonary/Chest: Effort normal and breath sounds normal. No respiratory distress. No wheezes. No rales.  Abdominal: Soft. Bowel sounds are normal. Exhibits no distension and no mass. There is no tenderness.  Musculoskeletal: Normal  range of motion. Exhibits no edema.  Lymphadenopathy:    No cervical adenopathy.  Neurological: Alert and oriented to person, place, and time. Exhibits normal muscle tone. Gait normal. Coordination normal.  Skin: Skin is warm and dry. No rash noted. Not diaphoretic. No erythema. No pallor.  Psychiatric: Mood, memory and judgment normal.  Vitals reviewed.  LABORATORY DATA: Lab Results  Component Value Date   WBC 1.0 (L) 12/10/2022   HGB 11.3 (L) 12/10/2022   HCT 34.9 (L) 12/10/2022   MCV 81.5 12/10/2022   PLT 172 12/10/2022      Chemistry      Component Value Date/Time   NA 136 12/10/2022 1022   NA 140 06/07/2021 0952  K 4.1 12/10/2022 1022   CL 99 12/10/2022 1022   CO2 32 12/10/2022 1022   BUN 19 12/10/2022 1022   BUN 17 06/07/2021 0952   CREATININE 1.24 12/10/2022 1022   CREATININE 0.92 11/14/2016 1652      Component Value Date/Time   CALCIUM 8.6 (L) 12/10/2022 1022   ALKPHOS 83 12/10/2022 1022   AST 30 12/10/2022 1022   ALT 55 (H) 12/10/2022 1022   BILITOT 0.5 12/10/2022 1022       RADIOGRAPHIC STUDIES:  NM PET Image Initial (PI) Skull Base To Thigh (F-18 FDG)  Result Date: 11/21/2022 CLINICAL DATA:  Initial treatment strategy for non-small cell lung cancer. EXAM: NUCLEAR MEDICINE PET SKULL BASE TO THIGH TECHNIQUE: 10.13 mCi F-18 FDG was injected intravenously. Full-ring PET imaging was performed from the skull base to thigh after the radiotracer. CT data was obtained and used for attenuation correction and anatomic localization. Fasting blood glucose: 98 mg/dl COMPARISON:  CT scan 10/28/2022 FINDINGS: Mediastinal blood pool activity: SUV max 2.05 Liver activity: SUV max NA NECK: No hypermetabolic lymph nodes in the neck. Incidental CT findings: 12 mm low-attenuation lesion associated with the left thyroid lobe does not show any hypermetabolism and is likely benign. No further imaging evaluation or follow-up is necessary. CHEST: Medial right upper lobe pulmonary nodule  measuring 14 mm demonstrates hypermetabolism with SUV max of 4.17. Extensive right-sided paratracheal and hilar necrotic adenopathy with SUV max of 14.84. Moderate-sized right pleural effusion with high attenuation material demonstrating hypermetabolism. This is likely pleural metastatic disease. The adjacent right ninth rib demonstrates cortical destruction of the deep cortex. SUV max is 6.12. Second hypermetabolic pleural nodule is noted more superiorly with SUV max of 4.42. No obvious pulmonary metastatic nodules. No chest wall mass is identified. Small, 6 mm left supraclavicular lymph node on image 70/3 has an SUV max of 2.26 and could be metastatic disease or reactive change. Incidental CT findings: Aortic and coronary artery calcifications. ABDOMEN/PELVIS: No evidence of hepatic or adrenal gland metastasis. Hypermetabolic lymph node between the celiac axis and the IVC measures 10 mm and has an SUV max of 5.03. Moderate scattered uptake in the colon stomach but no definite lesions. Incidental CT findings: Age advanced atherosclerotic calcifications involving the aorta and iliac arteries but no aneurysm. Surgical changes noted in the right abdomen. Small periumbilical abdominal wall hernia containing [name]. SKELETON: Large destructive metastatic bone lesion involving the right iliac bone demonstrates hypermetabolism with SUV max of 9.76. Right eleventh rib metastasis versus is adjacent implant. Incidental CT findings: None. IMPRESSION: 1. Hypermetabolic right upper lobe pulmonary lesion and adjacent extensive necrotic hypermetabolic mediastinal and right hilar adenopathy. 2. Right-sided pleural metastatic lesions in malignant right pleural effusion. 3. Osseous metastatic disease. 4. Single hypermetabolic retroperitoneal lymph node is likely metastatic. No hepatic or adrenal gland metastasis. 5. Small hypermetabolic left supraclavicular node could be reactive or metastatic disease. 6. Age advanced vascular disease.  Electronically Signed   By: Thomas [name] M.D.   On: 11/21/2022 17:19     ASSESSMENT/PLAN:  This is a very pleasant 72 year old Caucasian male diagnosed with stage IV (T1b, N3, and 1C) non-small cell lung cancer, adenocarcinoma.  The patient presented with a right upper lobe lung nodule in addition to mediastinal and hilar adenopathy as well as right-sided pleural metastatic disease and malignant right pleural effusion and multiple osseous metastatic lesions and a single hypermetabolic retroperitoneal lymph node.  He was diagnosed in December 2023.  The patient is positive for a K-ras G12  C mutation which can be used in the second line setting.  He is negative for PD-L1 expression.  The patient completed palliative radiation under the care of Dr. Roselind Messier to the right hip and chest?  He continues to have quite a bit of pain in this area.  He follows closely palliative care locally for which she is on fentanyl and Dilaudid.  For his systemic treatment, the patient is currently on carboplatin for AUC of 5, Alimta 500 mg/m, Keytruda 200 mg IV every 3 weeks.  The patient is status post 1 cycle and he tolerated it fairly well except for mild nausea and diarrhea   Labs were reviewed.  The patient's labs show total white blood cell count low at 1.0 today and ANC of 0.6.  We will arrange for him to receive Zarzio injections for the next 3 days.  Likely starting tomorrow morning.  He denies any signs and symptoms of infection and is well-appearing today.  He knows to call us if he develops any signs of infection for further testing/evaluation.  We also reviewed neutropenic precautions.  Encouraged him to alternate his Zofran and Compazine for better control of his nausea.  Also encouraged him to take Imodium if needed.  However if the patient notices that his diarrhea starts after taking his stool softeners and laxatives, he was encouraged to cut back on these.    Recommend that he continue on the same  treatment at the same dose.  We will see him back for follow-up visit in 2 weeks for evaluation repeat blood work before undergoing his next cycle of treatment.   We will arrange for weekly labs.   He was encouraged to use Tylenol for his headache.  His brain MRI did not show any metastatic disease to the brain.  He may at some point benefit from palliative radiotherapy to the pleural-based right lung metastasis by Dr. Roselind Messier.   He canceled his appointment for a nerve block with [facility].  If he is interested in seeing if this would help his pain, I encouraged him to call them back to reschedule. Otherwise, he met with palliative care today about his pain medication regimen.  The patient was advised to call immediately if she has any concerning symptoms in the interval. The patient voices understanding of current disease status and treatment options and is in agreement with the current care plan. All questions were answered. The patient knows to call the clinic with any problems, questions or concerns. We can certainly see the patient much sooner if necessary   No orders of the defined types were placed in this encounter.   The total time spent in the appointment was 20-29 minutes.   Arlo Buffone L Finn Altemose, PA-C 12/10/22

## 2022-12-09 ENCOUNTER — Other Ambulatory Visit: Payer: Self-pay | Admitting: Family Medicine

## 2022-12-09 ENCOUNTER — Encounter: Payer: Self-pay | Admitting: Radiation Oncology

## 2022-12-09 ENCOUNTER — Encounter: Payer: Self-pay | Admitting: Internal Medicine

## 2022-12-09 ENCOUNTER — Other Ambulatory Visit: Payer: Self-pay | Admitting: Physician Assistant

## 2022-12-09 DIAGNOSIS — C3491 Malignant neoplasm of unspecified part of right bronchus or lung: Secondary | ICD-10-CM

## 2022-12-09 NOTE — Progress Notes (Signed)
Called pt to introduce myself as his Dance movement psychotherapist and to discuss the [employer].  I left a msg requesting he return my call if he's interested in applying for the grant.

## 2022-12-10 ENCOUNTER — Inpatient Hospital Stay (HOSPITAL_BASED_OUTPATIENT_CLINIC_OR_DEPARTMENT_OTHER): Payer: Medicare Other | Admitting: Physician Assistant

## 2022-12-10 ENCOUNTER — Encounter: Payer: Self-pay | Admitting: Nurse Practitioner

## 2022-12-10 ENCOUNTER — Inpatient Hospital Stay: Payer: Medicare Other

## 2022-12-10 ENCOUNTER — Telehealth: Payer: Self-pay | Admitting: Medical Oncology

## 2022-12-10 ENCOUNTER — Inpatient Hospital Stay (HOSPITAL_BASED_OUTPATIENT_CLINIC_OR_DEPARTMENT_OTHER): Payer: Medicare Other | Admitting: Nurse Practitioner

## 2022-12-10 ENCOUNTER — Other Ambulatory Visit (HOSPITAL_COMMUNITY): Payer: Self-pay

## 2022-12-10 VITALS — Wt 199.6 lb

## 2022-12-10 VITALS — BP 143/60 | HR 103 | Temp 99.1°F | Resp 15 | Ht 71.0 in | Wt 199.4 lb

## 2022-12-10 DIAGNOSIS — M898X5 Other specified disorders of bone, thigh: Secondary | ICD-10-CM | POA: Diagnosis not present

## 2022-12-10 DIAGNOSIS — Z8673 Personal history of transient ischemic attack (TIA), and cerebral infarction without residual deficits: Secondary | ICD-10-CM | POA: Diagnosis not present

## 2022-12-10 DIAGNOSIS — I959 Hypotension, unspecified: Secondary | ICD-10-CM | POA: Diagnosis not present

## 2022-12-10 DIAGNOSIS — Z6827 Body mass index (BMI) 27.0-27.9, adult: Secondary | ICD-10-CM | POA: Diagnosis not present

## 2022-12-10 DIAGNOSIS — J90 Pleural effusion, not elsewhere classified: Secondary | ICD-10-CM | POA: Diagnosis not present

## 2022-12-10 DIAGNOSIS — R6510 Systemic inflammatory response syndrome (SIRS) of non-infectious origin without acute organ dysfunction: Secondary | ICD-10-CM | POA: Diagnosis not present

## 2022-12-10 DIAGNOSIS — Z87891 Personal history of nicotine dependence: Secondary | ICD-10-CM | POA: Diagnosis not present

## 2022-12-10 DIAGNOSIS — K219 Gastro-esophageal reflux disease without esophagitis: Secondary | ICD-10-CM | POA: Diagnosis present

## 2022-12-10 DIAGNOSIS — C3491 Malignant neoplasm of unspecified part of right bronchus or lung: Secondary | ICD-10-CM | POA: Diagnosis not present

## 2022-12-10 DIAGNOSIS — D709 Neutropenia, unspecified: Secondary | ICD-10-CM | POA: Diagnosis not present

## 2022-12-10 DIAGNOSIS — G893 Neoplasm related pain (acute) (chronic): Secondary | ICD-10-CM

## 2022-12-10 DIAGNOSIS — C3490 Malignant neoplasm of unspecified part of unspecified bronchus or lung: Secondary | ICD-10-CM | POA: Diagnosis not present

## 2022-12-10 DIAGNOSIS — Z96641 Presence of right artificial hip joint: Secondary | ICD-10-CM | POA: Diagnosis present

## 2022-12-10 DIAGNOSIS — K5732 Diverticulitis of large intestine without perforation or abscess without bleeding: Secondary | ICD-10-CM | POA: Diagnosis present

## 2022-12-10 DIAGNOSIS — A419 Sepsis, unspecified organism: Secondary | ICD-10-CM | POA: Diagnosis not present

## 2022-12-10 DIAGNOSIS — R519 Headache, unspecified: Secondary | ICD-10-CM | POA: Diagnosis not present

## 2022-12-10 DIAGNOSIS — I10 Essential (primary) hypertension: Secondary | ICD-10-CM | POA: Diagnosis not present

## 2022-12-10 DIAGNOSIS — R000 Tachycardia, unspecified: Secondary | ICD-10-CM | POA: Diagnosis not present

## 2022-12-10 DIAGNOSIS — Z923 Personal history of irradiation: Secondary | ICD-10-CM | POA: Diagnosis not present

## 2022-12-10 DIAGNOSIS — Z85828 Personal history of other malignant neoplasm of skin: Secondary | ICD-10-CM | POA: Diagnosis not present

## 2022-12-10 DIAGNOSIS — Z1152 Encounter for screening for COVID-19: Secondary | ICD-10-CM | POA: Diagnosis not present

## 2022-12-10 DIAGNOSIS — R197 Diarrhea, unspecified: Secondary | ICD-10-CM | POA: Diagnosis not present

## 2022-12-10 DIAGNOSIS — Z515 Encounter for palliative care: Secondary | ICD-10-CM

## 2022-12-10 DIAGNOSIS — E785 Hyperlipidemia, unspecified: Secondary | ICD-10-CM | POA: Diagnosis present

## 2022-12-10 DIAGNOSIS — Z888 Allergy status to other drugs, medicaments and biological substances status: Secondary | ICD-10-CM | POA: Diagnosis not present

## 2022-12-10 DIAGNOSIS — R630 Anorexia: Secondary | ICD-10-CM

## 2022-12-10 DIAGNOSIS — Z79899 Other long term (current) drug therapy: Secondary | ICD-10-CM | POA: Diagnosis not present

## 2022-12-10 DIAGNOSIS — K828 Other specified diseases of gallbladder: Secondary | ICD-10-CM | POA: Diagnosis not present

## 2022-12-10 DIAGNOSIS — R530 Neoplastic (malignant) related fatigue: Secondary | ICD-10-CM

## 2022-12-10 DIAGNOSIS — K5792 Diverticulitis of intestine, part unspecified, without perforation or abscess without bleeding: Secondary | ICD-10-CM | POA: Diagnosis not present

## 2022-12-10 DIAGNOSIS — N179 Acute kidney failure, unspecified: Secondary | ICD-10-CM | POA: Diagnosis not present

## 2022-12-10 DIAGNOSIS — C7951 Secondary malignant neoplasm of bone: Secondary | ICD-10-CM | POA: Diagnosis present

## 2022-12-10 DIAGNOSIS — M109 Gout, unspecified: Secondary | ICD-10-CM | POA: Diagnosis present

## 2022-12-10 DIAGNOSIS — E441 Mild protein-calorie malnutrition: Secondary | ICD-10-CM | POA: Diagnosis present

## 2022-12-10 DIAGNOSIS — K429 Umbilical hernia without obstruction or gangrene: Secondary | ICD-10-CM | POA: Diagnosis not present

## 2022-12-10 DIAGNOSIS — R911 Solitary pulmonary nodule: Secondary | ICD-10-CM | POA: Diagnosis not present

## 2022-12-10 DIAGNOSIS — Z9221 Personal history of antineoplastic chemotherapy: Secondary | ICD-10-CM | POA: Diagnosis not present

## 2022-12-10 DIAGNOSIS — D649 Anemia, unspecified: Secondary | ICD-10-CM | POA: Diagnosis present

## 2022-12-10 DIAGNOSIS — R509 Fever, unspecified: Secondary | ICD-10-CM | POA: Diagnosis not present

## 2022-12-10 DIAGNOSIS — E860 Dehydration: Secondary | ICD-10-CM | POA: Diagnosis present

## 2022-12-10 DIAGNOSIS — G4733 Obstructive sleep apnea (adult) (pediatric): Secondary | ICD-10-CM | POA: Diagnosis not present

## 2022-12-10 DIAGNOSIS — R6520 Severe sepsis without septic shock: Secondary | ICD-10-CM | POA: Diagnosis present

## 2022-12-10 LAB — CBC WITH DIFFERENTIAL (CANCER CENTER ONLY)
Abs Immature Granulocytes: 0.01 10*3/uL (ref 0.00–0.07)
Basophils Absolute: 0 10*3/uL (ref 0.0–0.1)
Basophils Relative: 1 %
Eosinophils Absolute: 0 10*3/uL (ref 0.0–0.5)
Eosinophils Relative: 3 %
HCT: 34.9 % — ABNORMAL LOW (ref 39.0–52.0)
Hemoglobin: 11.3 g/dL — ABNORMAL LOW (ref 13.0–17.0)
Immature Granulocytes: 1 %
Lymphocytes Relative: 21 %
Lymphs Abs: 0.2 10*3/uL — ABNORMAL LOW (ref 0.7–4.0)
MCH: 26.4 pg (ref 26.0–34.0)
MCHC: 32.4 g/dL (ref 30.0–36.0)
MCV: 81.5 fL (ref 80.0–100.0)
Monocytes Absolute: 0.2 10*3/uL (ref 0.1–1.0)
Monocytes Relative: 17 %
Neutro Abs: 0.6 10*3/uL — ABNORMAL LOW (ref 1.7–7.7)
Neutrophils Relative %: 57 %
Platelet Count: 172 10*3/uL (ref 150–400)
RBC: 4.28 MIL/uL (ref 4.22–5.81)
RDW: 15.5 % (ref 11.5–15.5)
WBC Count: 1 10*3/uL — ABNORMAL LOW (ref 4.0–10.5)
nRBC: 0 % (ref 0.0–0.2)

## 2022-12-10 LAB — CMP (CANCER CENTER ONLY)
ALT: 55 U/L — ABNORMAL HIGH (ref 0–44)
AST: 30 U/L (ref 15–41)
Albumin: 3.5 g/dL (ref 3.5–5.0)
Alkaline Phosphatase: 83 U/L (ref 38–126)
Anion gap: 5 (ref 5–15)
BUN: 19 mg/dL (ref 8–23)
CO2: 32 mmol/L (ref 22–32)
Calcium: 8.6 mg/dL — ABNORMAL LOW (ref 8.9–10.3)
Chloride: 99 mmol/L (ref 98–111)
Creatinine: 1.24 mg/dL (ref 0.61–1.24)
GFR, Estimated: >60 mL/min (ref >60)
Glucose, Bld: 107 mg/dL — ABNORMAL HIGH (ref 70–99)
Potassium: 4.1 mmol/L (ref 3.5–5.1)
Sodium: 136 mmol/L (ref 135–145)
Total Bilirubin: 0.5 mg/dL (ref 0.3–1.2)
Total Protein: 6.6 g/dL (ref 6.5–8.1)

## 2022-12-10 MED ORDER — FENTANYL 50 MCG/HR TD PT72
1.0000 | MEDICATED_PATCH | TRANSDERMAL | 0 refills | Status: DC
  Filled 2022-12-12 – 2022-12-15 (×2): qty 10, 30d supply, fill #0
  Filled ????-??-??: fill #0

## 2022-12-10 NOTE — Telephone Encounter (Addendum)
Transferred his call to Dr Lois Huxley nurse re his appt today.

## 2022-12-10 NOTE — Progress Notes (Signed)
  Radiation Oncology         [phone] ________________________________  [name] MRN: [id number] DOB: 01/10/1951 Referring Physician: Curt Bears (Profile Not Attached) Date of Service: 11/13/2022 [facility], [place]                                                        End Of Treatment Note  Diagnoses: C79.51-Secondary malignant neoplasm of bone  Cancer Staging: The encounter diagnosis was Lytic bone lesion of hip.    Cancer Staging  Adenocarcinoma of right lung, stage 4 (HCC) Staging form: Lung, AJCC 8th Edition - Clinical: Stage IVB (cT1b, cN3, cM1c) - Signed by Curt Bears, MD on 11/26/2022  Intent: Palliative  Radiation Treatment Dates: 10/30/2022 through 11/13/2022 Site Technique Total Dose (Gy) Dose per Fx (Gy) Completed Fx Beam Energies  Ribs, Right: Chest_R 3D 30/30 3 10/10 6X, 10X  Ilium, Right: Pelvis_R 3D 30/30 3 10/10 10X   Narrative: The patient tolerated radiation therapy relatively well. During his final weekly treatment check on 11/11/22, the patient reported some pain (rated at a 6/10) and fatigue. He reported that his pain did not improve with his treatments thus far. Particularly the patient complained of pain along the right flank radiating into his right upper abdomen. He had an additional Durogesic patch placed near the time of his final treatment which hopefully provided more pain relief. We discussed nonsteroidals but he is already taking these medications in addition.  Plan: The patient will follow-up with radiation oncology in one month .  ________________________________________________ -----------------------------------  Blair Promise, PhD, MD  This document serves as a record of services personally performed by Gery Pray, MD. It was created on his behalf by Roney Mans, a trained medical scribe. The creation of this record is based on the scribe's personal observations and the provider's  statements to them. This document has been checked and approved by the attending provider.

## 2022-12-10 NOTE — Progress Notes (Signed)
Radiation Oncology         [phone] ________________________________  Name: Thomas [name] MRN: [id number]  Date: 12/11/2022  DOB: 12-May-1951  Follow-Up Visit Note   CC: Pickenpack-Cousar, Carlena Sax, NP  Curt Bears, MD  No diagnosis found.  Diagnosis: The encounter diagnosis was Lytic bone lesion of hip.    Interval Since Last Radiation: 28 days   Intent: Palliative  Radiation Treatment Dates: 10/30/2022 through 11/13/2022 Site Technique Total Dose (Gy) Dose per Fx (Gy) Completed Fx Beam Energies  Ribs, Right: Chest_R 3D 30/30 3 10/10 6X, 10X  Ilium, Right: Pelvis_R 3D 30/30 3 10/10 10X   Narrative: The patient presents today via telephone for his 1 month routine follow-up visit.   Since his initial consultation date on 10/29/22, the patient underwent a CT guided biopsy of the right pelvic/iliac lesion. 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 (Guardant 360 (blood) showed KRAS G12c).   Since completing radiation therapy to the right pelvis and right chest, the patient presented for a PET scan on 11/20/21 which demonstrated: the hypermetabolic right upper lobe pulmonary lesion; adjacent extensive necrotic hypermetabolic mediastinal and right hilar adenopathy; right-sided pleural metastatic lesions based in a malignant right pleural effusion; the large destructive metastatic bone lesion involving the right iliac bone (SUV of 9.76); the right 11th rib metastasis; a single hypermetabolic retroperitoneal lymph node which is likely Metastatic; and a small hypermetabolic left supraclavicular node which could be reactive in etiology or possibly reflective metastatic disease.    Most notably, the patient began chemotherapy consisting of carboplatin, Alimta, and Keytruda on 12/03/22 under Dr. Julien Nordmann. Per his most recent follow-up with medical oncology yesterday, the patient tolerated his first cycle of chemotherapy well other than some nausea and loose  stools. Labs collected yesterday during this visit showed a low white count and the patient was arranged to receive Zarzio injections x 3 days. The patient will return to Dr. Julien Nordmann in 2 weeks for evaluation and repeat blood work before undergoing his next cycle of treatment.                                 The patient continues to struggle with pain. In addition to following with [facility], the patient is scheduled to meet with [facility] pain management. His current pain management regimen includes Dilaudid every 4 hours, 900 mg of gabapentin at bedtime, and fentanyl patches at 50 mcg.   Of note: MRI of the brain on 10/29/22 showed no evidence of intracranial metastatic disease.   ***  Allergies:  is allergic to statins.  Meds: Current Outpatient Medications  Medication Sig Dispense Refill   acetaminophen (TYLENOL) 650 MG CR tablet Take 1,300 mg by mouth in the morning and at bedtime.     allopurinol (ZYLOPRIM) 300 MG tablet Take 300 mg by mouth daily.     amLODipine (NORVASC) 5 MG tablet Take 1 tablet (5 mg total) by mouth daily. (Patient taking differently: Take 5 mg by mouth every evening.) 190 tablet 3   dexamethasone (DECADRON) 2 MG tablet Take 2 tablets (4mg ) for 7 days. Then decrease to 1 (2mg ) tablet for 7 days. Then decrease to 1 (2mg ) tablet every other day. 26 tablet 0   DULoxetine (CYMBALTA) 30 MG capsule Take 1 capsule (30 mg total) by mouth daily. (Patient not taking: Reported on 12/10/2022) 30 capsule 1   Evolocumab (REPATHA SURECLICK) 643 MG/ML SOAJ Inject 140 mg  into the skin every 14 (fourteen) days. 2 mL 11   [START ON 12/12/2022] fentaNYL (DURAGESIC) 50 MCG/HR Place 1 patch onto the skin every 3 (three) days. 10 patch 0   folic acid (FOLVITE) 1 MG tablet Take 1 tablet (1 mg total) by mouth daily. 30 tablet 4   gabapentin (NEURONTIN) 300 MG capsule Take 900 mg by mouth at bedtime.     HYDROmorphone (DILAUDID) 8 MG tablet Take 1 tablet (8 mg total) by mouth every 4  (four) hours as needed for severe pain. 90 tablet 0   ibuprofen (ADVIL) 200 MG tablet Take 600 mg by mouth every 8 (eight) hours as needed for moderate pain.     methocarbamol (ROBAXIN) 750 MG tablet Take 750 mg by mouth every 8 (eight) hours as needed for muscle spasms.     minoxidil (LONITEN) 10 MG tablet Take 1-1.5 tablets (10-15 mg total) by mouth 2 (two) times daily. TAKE 1 TABLET IN THE MORNING AND TAKE 1/2 TABLET AT NIGHT (Patient taking differently: Take 10 mg by mouth 2 (two) times daily.) 135 tablet 1   ondansetron (ZOFRAN-ODT) 4 MG disintegrating tablet Take 1-2 tablets (4-8 mg total) by mouth every 6 (six) hours as needed for nausea or vomiting. 60 tablet 1   pantoprazole (PROTONIX) 40 MG tablet Take 1 tablet (40 mg total) by mouth 2 (two) times daily. 60 tablet 2   polyethylene glycol (MIRALAX / GLYCOLAX) 17 g packet Take 17 g by mouth 2 (two) times daily. 14 each 0   PRESCRIPTION MEDICATION Apply 1 Application topically at bedtime as needed (pain). Compounded pain cream ([facility])     prochlorperazine (COMPAZINE) 10 MG tablet Take 1 tablet (10 mg total) by mouth every 6 (six) hours as needed for nausea or vomiting. 30 tablet 0   senna (SENOKOT) 8.6 MG TABS tablet Take 2 tablets (17.2 mg total) by mouth 2 (two) times daily. 120 tablet 6   valsartan (DIOVAN) 320 MG tablet TAKE 1 TABLET BY MOUTH EVERY DAY (Patient taking differently: Take 320 mg by mouth daily.) 90 tablet 3   No current facility-administered medications for this encounter.    Physical Findings: The patient is in no acute distress. Patient is alert and oriented.  vitals were not taken for this visit. .  No significant changes. Lungs are clear to auscultation bilaterally. Heart has regular rate and rhythm. No palpable cervical, supraclavicular, or axillary adenopathy. Abdomen soft, non-tender, normal bowel sounds.   Lab Findings: Lab Results  Component Value Date   WBC 1.0 (L) 12/10/2022   HGB 11.3 (L)  12/10/2022   HCT 34.9 (L) 12/10/2022   MCV 81.5 12/10/2022   PLT 172 12/10/2022    Radiographic Findings: NM PET Image Initial (PI) Skull Base To Thigh (F-18 FDG)  Result Date: 11/21/2022 CLINICAL DATA:  Initial treatment strategy for non-small cell lung cancer. EXAM: NUCLEAR MEDICINE PET SKULL BASE TO THIGH TECHNIQUE: 10.13 mCi F-18 FDG was injected intravenously. Full-ring PET imaging was performed from the skull base to thigh after the radiotracer. CT data was obtained and used for attenuation correction and anatomic localization. Fasting blood glucose: 98 mg/dl COMPARISON:  CT scan 10/28/2022 FINDINGS: Mediastinal blood pool activity: SUV max 2.05 Liver activity: SUV max NA NECK: No hypermetabolic lymph nodes in the neck. Incidental CT findings: 12 mm low-attenuation lesion associated with the left thyroid lobe does not show any hypermetabolism and is likely benign. No further imaging evaluation or follow-up is necessary. CHEST: Medial right upper  lobe pulmonary nodule measuring 14 mm demonstrates hypermetabolism with SUV max of 4.17. Extensive right-sided paratracheal and hilar necrotic adenopathy with SUV max of 14.84. Moderate-sized right pleural effusion with high attenuation material demonstrating hypermetabolism. This is likely pleural metastatic disease. The adjacent right ninth rib demonstrates cortical destruction of the deep cortex. SUV max is 6.12. Second hypermetabolic pleural nodule is noted more superiorly with SUV max of 4.42. No obvious pulmonary metastatic nodules. No chest wall mass is identified. Small, 6 mm left supraclavicular lymph node on image 70/3 has an SUV max of 2.26 and could be metastatic disease or reactive change. Incidental CT findings: Aortic and coronary artery calcifications. ABDOMEN/PELVIS: No evidence of hepatic or adrenal gland metastasis. Hypermetabolic lymph node between the celiac axis and the IVC measures 10 mm and has an SUV max of 5.03. Moderate scattered  uptake in the colon stomach but no definite lesions. Incidental CT findings: Age advanced atherosclerotic calcifications involving the aorta and iliac arteries but no aneurysm. Surgical changes noted in the right abdomen. Small periumbilical abdominal wall hernia containing fat. SKELETON: Large destructive metastatic bone lesion involving the right iliac bone demonstrates hypermetabolism with SUV max of 9.76. Right eleventh rib metastasis versus is adjacent implant. Incidental CT findings: None. IMPRESSION: 1. Hypermetabolic right upper lobe pulmonary lesion and adjacent extensive necrotic hypermetabolic mediastinal and right hilar adenopathy. 2. Right-sided pleural metastatic lesions in malignant right pleural effusion. 3. Osseous metastatic disease. 4. Single hypermetabolic retroperitoneal lymph node is likely metastatic. No hepatic or adrenal gland metastasis. 5. Small hypermetabolic left supraclavicular node could be reactive or metastatic disease. 6. Age advanced vascular disease. Electronically Signed   By: Marijo Sanes M.D.   On: 11/21/2022 17:19    Impression:  The encounter diagnosis was Lytic bone lesion of hip.    The patient is recovering from the effects of radiation.  ***  Plan:  ***   *** minutes of total time was spent for this patient encounter, including preparation, face-to-face counseling with the patient and coordination of care, physical exam, and documentation of the encounter. ____________________________________  Blair Promise, PhD, MD  This document serves as a record of services personally performed by Gery Pray, MD. It was created on his behalf by Roney Mans, a trained medical scribe. The creation of this record is based on the scribe's personal observations and the provider's statements to them. This document has been checked and approved by the attending provider.

## 2022-12-10 NOTE — Progress Notes (Signed)
[facility]  Telephone:(336) [phone] Fax:(336) [phone]   Name: Thomas [name] Date: 12/10/2022 MRN: [id number]  DOB: Jun 11, 1951  Patient Care Team: Jimmy Footman, NP as PCP - General (Nurse Practitioner) Park Liter, MD as PCP - Cardiology (Cardiology) Pickenpack-Cousar, Carlena Sax, NP as Nurse Practitioner (Nurse Practitioner)     INTERVAL HISTORY: Thomas [name] is a 72 y.o. male with oncologic medical history including right iliac lytic lesion, right upper lob nodule (pending pathology), right paratracheal nodule, and right hilar and mediastinal adenopathy. He is currently undergoing radiation to right iliac and right rib. Pending additional oncology interventions based on pathology report.  Palliative ask to see for symptom management and goals of care.   SOCIAL HISTORY:     reports that he quit smoking about 28 years ago. His smoking use included cigarettes. He has a 6.00 pack-year smoking history. He has never used smokeless tobacco. He reports that he does not currently use alcohol. He reports that he does not use drugs.  ADVANCE DIRECTIVES:    CODE STATUS:   PAST MEDICAL HISTORY: Past Medical History:  Diagnosis Date   Abdominal wall pain 04/02/2021   Acute ischemic stroke ([place]) 03/22/2020   Avulsed toenail, initial encounter 05/04/2018   Dyspnea on exertion 02/10/2020   ED (erectile dysfunction)    Epidermoid cyst of neck 10/16/2017   Essential hypertension 07/21/2017   GERD (gastroesophageal reflux disease)    Gout of multiple sites 07/21/2017   History of kidney stones    History of radiation therapy    Right Chest, Right Pelvis- 10/30/22-11/13/22- Sr. 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  MEDICATIONS:  Current Outpatient Medications  Medication Sig Dispense Refill   acetaminophen (TYLENOL) 650 MG CR tablet Take 1,300 mg by mouth in the morning and at bedtime.     allopurinol (ZYLOPRIM) 300 MG tablet Take 300 mg by mouth daily.     amLODipine (NORVASC) 5 MG tablet Take 1 tablet (5 mg total) by mouth daily. (Patient taking differently: Take 5 mg by mouth every evening.) 190 tablet 3   dexamethasone (DECADRON) 2 MG tablet Take 2 tablets (4mg ) for 7 days. Then decrease to 1 (2mg ) tablet for 7 days. Then decrease to 1 (2mg ) tablet every other day. 26 tablet 0   DULoxetine (CYMBALTA) 30 MG capsule Take 1 capsule (30 mg total) by mouth daily. 30 capsule 1   Evolocumab (REPATHA SURECLICK) 676 MG/ML SOAJ Inject 140 mg into the skin every 14 (fourteen) days. 2 mL 11   fentaNYL (DURAGESIC) 50 MCG/HR Place 1 patch onto the skin every 3 (three) days. 10 patch 0   folic acid (FOLVITE) 1 MG tablet Take 1 tablet (1 mg total) by mouth daily. 30 tablet 4   gabapentin (NEURONTIN) 300 MG capsule Take 900 mg by mouth at bedtime.  HYDROmorphone (DILAUDID) 8 MG tablet Take 1 tablet (8 mg total) by mouth every 4 (four) hours as needed for severe pain. 90 tablet 0   ibuprofen (ADVIL) 200 MG tablet Take 600 mg by mouth every 8 (eight) hours as needed for moderate pain.     methocarbamol (ROBAXIN) 750 MG tablet Take 750 mg by mouth every 8 (eight) hours as needed for muscle spasms.     minoxidil (LONITEN) 10 MG tablet Take 1-1.5 tablets (10-15 mg total) by mouth 2 (two) times daily. TAKE 1 TABLET IN THE MORNING AND TAKE 1/2 TABLET AT NIGHT  (Patient taking differently: Take 10 mg by mouth 2 (two) times daily.) 135 tablet 1   ondansetron (ZOFRAN-ODT) 4 MG disintegrating tablet Take 1-2 tablets (4-8 mg total) by mouth every 6 (six) hours as needed for nausea or vomiting. 60 tablet 1   pantoprazole (PROTONIX) 40 MG tablet Take 1 tablet (40 mg total) by mouth 2 (two) times daily. 60 tablet 2   polyethylene glycol (MIRALAX / GLYCOLAX) 17 g packet Take 17 g by mouth 2 (two) times daily. 14 each 0   PRESCRIPTION MEDICATION Apply 1 Application topically at bedtime as needed (pain). Compounded pain cream ([facility])     prochlorperazine (COMPAZINE) 10 MG tablet Take 1 tablet (10 mg total) by mouth every 6 (six) hours as needed for nausea or vomiting. 30 tablet 0   senna (SENOKOT) 8.6 MG TABS tablet Take 2 tablets (17.2 mg total) by mouth 2 (two) times daily. 120 tablet 6   valsartan (DIOVAN) 320 MG tablet TAKE 1 TABLET BY MOUTH EVERY DAY (Patient taking differently: Take 320 mg by mouth daily.) 90 tablet 3   No current facility-administered medications for this visit.    VITAL SIGNS: There were no vitals taken for this visit. There were no vitals filed for this visit.  Estimated body mass index is 27.84 kg/m as calculated from the following:   Height as of 11/12/22: 5\' 11"  (1.803 m).   Weight as of 12/03/22: 199 lb 10 oz (90.5 kg).   PERFORMANCE STATUS (ECOG) : 1 - Symptomatic but completely ambulatory  Assessment NAD, sitting in recliner position in chair RRR Normal breathing pattern AAO x3, mood appropriate   IMPRESSION: Thomas [name] presents to clinic today for symptom management follow-up.  No acute distress noted.  His wife is at bedside.  Patient is trying to remain as active as possible however with some limitations due to ongoing fatigue, nausea, diarrhea, and pain.  Recently completed his first cycle of chemotherapy.  Shares he tolerated well until several days later when he began having ongoing nausea and  diarrhea.  Patient was advised to begin taking Imodium for his diarrhea.  Education provided on use of BRAT diet.  Wife shares he has been able to eat some foods.  Weight is remaining stable at 199 pounds.  Weight on 1/10 was 200 pounds.  Education also provided on protein drinks as well as Gatorade or Pedialyte.  He confirms he has these items in his home.   Neoplasm related pain  Salome shares some improvement in pain with recent increase of hydromorphone to 8mg  (increased from 6mg ). He does endorse right flank/inguinal discomfort.   We have reviewed current regimen: hydromorphone 8mg  every 4 hours as needed for breakthrough pain and fentanyl 50 mcg patch. He should also be taking gabapentin 900 mg at bedtime. We previously attempted Cymbalta at lowest dose of 20 mg to further assist in pain management as  well as mood. Unfortunately patient self-discontinued as he did not appreciate how it made him feel.  Given recent changes to medication regimen and start of new treatment. No additional changes to be made at this time. Education provided that pain should improve now that he has started treatment. I will be reluctant to make changes to fentanyl patch early on in treatment course as to not overdo medications. Hopeful with treatment symptoms will decrease allowing for necessary improvement.  We discussed managing all symptoms collectively.  We will continue to closely monitor pain and make adjustments as needed.  Again hopeful that with ongoing treatment this will begin to improve as he is coming up on his second cycle.  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  Per reports from medical team previously he has a scheduled appointment for further consideration of nerve block.  I think this will be ideal in assisting his pain management and minimizing changes to oral medications given this is a chronic condition.  Aws also shares his struggle with insomnia over the past  several days s/p treatment.  Advised hopefully this will resolve once symptoms began to resolve.  Will continue to closely monitor.  Patient has signed pain contract on file. Of note I have completed extensive pain review including past contractes and electronic documents from Dr. Wyline Mood ([facility]) and Dr. Warnell Forester [facility]). Aldwin has received nerve blocks in the past and was on a lengthy course of prednisone which he endorses some improvement for his pain at that time. 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   We will continue to closely monitor and make safe adjustments as needed.   I discussed the importance of continued conversation with family and their medical providers regarding overall plan of care and treatment options, ensuring decisions are within the context of the patients values and GOCs.  PLAN: Fentanyl 50 mcg patch.   Hydromorphone 8 mg as needed for breakthrough pain.  Gabapentin 900 mg at bedtime. Does not feel any relief when taking 300 mg twice daily in addition to night dose. Zofran 4-8 mg as needed for nausea   MiraLAX/senna daily for bowel regimen (on hold due to loose stools) Education provided on nutrition in the setting of nausea and diarrhea.  Encourage patient to drink Gatorade/Pedialyte for additional support. We will continue to closely monitor and adjust medications as needed. I will plan to see patient back in 3-4 weeks in collaboration with his other oncology appointments.  We will plan to follow-up on Monday via phone for close symptom management.   Patient expressed understanding and was in agreement with this plan. He also understands that He can call the clinic at any time with any questions, concerns, or complaints.       Any controlled substances utilized were prescribed in the context of palliative care. PDMP has been reviewed.    Time Total: 30 min   Visit consisted of counseling and education dealing  with the complex and emotionally intense issues of symptom management and palliative care in the setting of serious and potentially life-threatening illness.Greater than 50%  of this time was spent counseling and coordinating care related to the above assessment and plan.  Alda Lea, AGPCNP-BC  Palliative Medicine Team/[facility]

## 2022-12-11 ENCOUNTER — Other Ambulatory Visit: Payer: Self-pay | Admitting: Physician Assistant

## 2022-12-11 ENCOUNTER — Other Ambulatory Visit: Payer: Self-pay

## 2022-12-11 ENCOUNTER — Ambulatory Visit
Admission: RE | Admit: 2022-12-11 | Discharge: 2022-12-11 | Disposition: A | Discharge status: Home / Self Care | Payer: Medicare Other | Source: Ambulatory Visit | Attending: Radiation Oncology | Admitting: Radiation Oncology

## 2022-12-11 ENCOUNTER — Inpatient Hospital Stay: Payer: Medicare Other

## 2022-12-11 ENCOUNTER — Encounter: Payer: Self-pay | Admitting: Radiation Oncology

## 2022-12-11 VITALS — BP 153/60 | HR 100 | Resp 16

## 2022-12-11 DIAGNOSIS — D709 Neutropenia, unspecified: Secondary | ICD-10-CM

## 2022-12-11 DIAGNOSIS — C3491 Malignant neoplasm of unspecified part of right bronchus or lung: Secondary | ICD-10-CM

## 2022-12-11 HISTORY — DX: Personal history of irradiation: Z92.3

## 2022-12-11 MED ORDER — FILGRASTIM-SNDZ 480 MCG/0.8ML IJ SOSY
480.0000 ug | PREFILLED_SYRINGE | Freq: Once | INTRAMUSCULAR | Status: AC
  Administered 2022-12-11: 480 ug via SUBCUTANEOUS
  Filled 2022-12-11: qty 0.8

## 2022-12-11 NOTE — Patient Instructions (Signed)
Filgrastim Injection What is this medication? FILGRASTIM (fil GRA stim) lowers the risk of infection in people who are receiving chemotherapy. It works by Building control surveyor make more white blood cells, which protects your body from infection. It may also be used to help people who have been exposed to high doses of radiation. It can be used to help prepare your body before a stem cell transplant. It works by helping your bone marrow make and release stem cells into the blood. This medicine may be used for other purposes; ask your health care provider or pharmacist if you have questions. COMMON BRAND NAME(S): Neupogen, Nivestym, Releuko, Zarxio What should I tell my care team before I take this medication? They need to know if you have any of these conditions: History of blood diseases, such as sickle cell anemia Kidney disease Recent or ongoing radiation An unusual or allergic reaction to filgrastim, pegfilgrastim, latex, rubber, other medications, foods, dyes, or preservatives Pregnant or trying to get pregnant Breast-feeding How should I use this medication? This medication is injected under the skin or into a vein. It is usually given by your care team in a hospital or clinic setting. It may be given at home. If you get this medication at home, you will be taught how to prepare and give it. Use exactly as directed. Take it as directed on the prescription label at the same time every day. Keep taking it unless your care team tells you to stop. It is important that you put your used needles and syringes in a special sharps container. Do not put them in a trash can. If you do not have a sharps container, call your pharmacist or care team to get one. This medication comes with INSTRUCTIONS FOR USE. Ask your pharmacist for directions on how to use this medication. Read the information carefully. Talk to your pharmacist or care team if you have questions. Talk to your care team about the use of this  medication in children. While it may be prescribed for children for selected conditions, precautions do apply. Overdosage: If you think you have taken too much of this medicine contact a poison control center or emergency room at once. NOTE: This medicine is only for you. Do not share this medicine with others. What if I miss a dose? It is important not to miss any doses. Talk to your care team about what to do if you miss a dose. What may interact with this medication? Medications that may cause a release of neutrophils, such as lithium This list may not describe all possible interactions. Give your health care provider a list of all the medicines, herbs, non-prescription drugs, or dietary supplements you use. Also tell them if you smoke, drink alcohol, or use illegal drugs. Some items may interact with your medicine. What should I watch for while using this medication? Your condition will be monitored carefully while you are receiving this medication. You may need bloodwork while taking this medication. Talk to your care team about your risk of cancer. You may be more at risk for certain types of cancer if you take this medication. What side effects may I notice from receiving this medication? Side effects that you should report to your care team as soon as possible: Allergic reactions--skin rash, itching, hives, swelling of the face, lips, tongue, or throat Capillary leak syndrome--stomach or muscle pain, unusual weakness or fatigue, feeling faint or lightheaded, decrease in the amount of urine, swelling of the ankles, hands, or  feet, trouble breathing High white blood cell level--fever, fatigue, trouble breathing, night sweats, change in vision, weight loss Inflammation of the aorta--fever, fatigue, back, chest, or stomach pain, severe headache Kidney injury (glomerulonephritis)--decrease in the amount of urine, red or dark brown urine, foamy or bubbly urine, swelling of the ankles, hands, or  feet Shortness of breath or trouble breathing Spleen injury--pain in upper left stomach or shoulder Unusual bruising or bleeding Side effects that usually do not require medical attention (report to your care team if they continue or are bothersome): Back pain Bone pain Fatigue Fever Headache Nausea This list may not describe all possible side effects. Call your doctor for medical advice about side effects. You may report side effects to FDA at 1-800-FDA-1088. Where should I keep my medication? Keep out of the reach of children and pets. Keep this medication in the original packaging until you are ready to take it. Protect from light. See product for storage information. Each product may have different instructions. Get rid of any unused medication after the expiration date. To get rid of medications that are no longer needed or have expired: Take the medication to a medications take-back program. Check with your pharmacy or law enforcement to find a location. If you cannot return the medication, ask your pharmacist or care team how to get rid of this medication safely. NOTE: This sheet is a summary. It may not cover all possible information. If you have questions about this medicine, talk to your doctor, pharmacist, or health care provider.  2023 [employer] (2022-02-11 00:00:00)

## 2022-12-12 ENCOUNTER — Emergency Department (HOSPITAL_COMMUNITY): Payer: Medicare Other

## 2022-12-12 ENCOUNTER — Other Ambulatory Visit: Payer: Self-pay

## 2022-12-12 ENCOUNTER — Other Ambulatory Visit (HOSPITAL_COMMUNITY): Payer: Self-pay

## 2022-12-12 ENCOUNTER — Encounter (HOSPITAL_COMMUNITY): Payer: Self-pay | Admitting: Internal Medicine

## 2022-12-12 ENCOUNTER — Inpatient Hospital Stay: Payer: Medicare Other

## 2022-12-12 ENCOUNTER — Inpatient Hospital Stay (HOSPITAL_COMMUNITY)
Admission: EM | Admit: 2022-12-12 | Discharge: 2022-12-15 | DRG: 872 | Disposition: A | Discharge status: Home / Self Care | Payer: Medicare Other | Source: Ambulatory Visit | Attending: Internal Medicine | Admitting: Internal Medicine

## 2022-12-12 VITALS — BP 104/50 | HR 119 | Temp 101.2°F | Resp 16

## 2022-12-12 DIAGNOSIS — Z8673 Personal history of transient ischemic attack (TIA), and cerebral infarction without residual deficits: Secondary | ICD-10-CM

## 2022-12-12 DIAGNOSIS — T451X5A Adverse effect of antineoplastic and immunosuppressive drugs, initial encounter: Secondary | ICD-10-CM

## 2022-12-12 DIAGNOSIS — C3491 Malignant neoplasm of unspecified part of right bronchus or lung: Secondary | ICD-10-CM | POA: Diagnosis present

## 2022-12-12 DIAGNOSIS — D649 Anemia, unspecified: Secondary | ICD-10-CM | POA: Diagnosis present

## 2022-12-12 DIAGNOSIS — A419 Sepsis, unspecified organism: Principal | ICD-10-CM | POA: Diagnosis present

## 2022-12-12 DIAGNOSIS — Z811 Family history of alcohol abuse and dependence: Secondary | ICD-10-CM

## 2022-12-12 DIAGNOSIS — Z9221 Personal history of antineoplastic chemotherapy: Secondary | ICD-10-CM

## 2022-12-12 DIAGNOSIS — R6520 Severe sepsis without septic shock: Secondary | ICD-10-CM | POA: Diagnosis present

## 2022-12-12 DIAGNOSIS — C7951 Secondary malignant neoplasm of bone: Secondary | ICD-10-CM | POA: Diagnosis present

## 2022-12-12 DIAGNOSIS — M109 Gout, unspecified: Secondary | ICD-10-CM | POA: Diagnosis present

## 2022-12-12 DIAGNOSIS — K219 Gastro-esophageal reflux disease without esophagitis: Secondary | ICD-10-CM | POA: Diagnosis present

## 2022-12-12 DIAGNOSIS — Z85828 Personal history of other malignant neoplasm of skin: Secondary | ICD-10-CM

## 2022-12-12 DIAGNOSIS — E860 Dehydration: Secondary | ICD-10-CM | POA: Diagnosis present

## 2022-12-12 DIAGNOSIS — Z818 Family history of other mental and behavioral disorders: Secondary | ICD-10-CM

## 2022-12-12 DIAGNOSIS — Z87891 Personal history of nicotine dependence: Secondary | ICD-10-CM

## 2022-12-12 DIAGNOSIS — E785 Hyperlipidemia, unspecified: Secondary | ICD-10-CM | POA: Diagnosis present

## 2022-12-12 DIAGNOSIS — K5732 Diverticulitis of large intestine without perforation or abscess without bleeding: Secondary | ICD-10-CM | POA: Diagnosis present

## 2022-12-12 DIAGNOSIS — K5792 Diverticulitis of intestine, part unspecified, without perforation or abscess without bleeding: Secondary | ICD-10-CM | POA: Diagnosis present

## 2022-12-12 DIAGNOSIS — N179 Acute kidney failure, unspecified: Secondary | ICD-10-CM | POA: Diagnosis present

## 2022-12-12 DIAGNOSIS — I10 Essential (primary) hypertension: Secondary | ICD-10-CM | POA: Diagnosis present

## 2022-12-12 DIAGNOSIS — E440 Moderate protein-calorie malnutrition: Secondary | ICD-10-CM | POA: Diagnosis present

## 2022-12-12 DIAGNOSIS — Z1152 Encounter for screening for COVID-19: Secondary | ICD-10-CM

## 2022-12-12 DIAGNOSIS — Z923 Personal history of irradiation: Secondary | ICD-10-CM

## 2022-12-12 DIAGNOSIS — R6510 Systemic inflammatory response syndrome (SIRS) of non-infectious origin without acute organ dysfunction: Secondary | ICD-10-CM

## 2022-12-12 DIAGNOSIS — E441 Mild protein-calorie malnutrition: Secondary | ICD-10-CM | POA: Diagnosis present

## 2022-12-12 DIAGNOSIS — Z79899 Other long term (current) drug therapy: Secondary | ICD-10-CM

## 2022-12-12 DIAGNOSIS — Z96641 Presence of right artificial hip joint: Secondary | ICD-10-CM | POA: Diagnosis present

## 2022-12-12 DIAGNOSIS — Z6827 Body mass index (BMI) 27.0-27.9, adult: Secondary | ICD-10-CM

## 2022-12-12 DIAGNOSIS — Z807 Family history of other malignant neoplasms of lymphoid, hematopoietic and related tissues: Secondary | ICD-10-CM

## 2022-12-12 DIAGNOSIS — D709 Neutropenia, unspecified: Secondary | ICD-10-CM | POA: Diagnosis present

## 2022-12-12 DIAGNOSIS — R197 Diarrhea, unspecified: Secondary | ICD-10-CM

## 2022-12-12 DIAGNOSIS — Z515 Encounter for palliative care: Secondary | ICD-10-CM

## 2022-12-12 DIAGNOSIS — Z8249 Family history of ischemic heart disease and other diseases of the circulatory system: Secondary | ICD-10-CM

## 2022-12-12 DIAGNOSIS — Z888 Allergy status to other drugs, medicaments and biological substances status: Secondary | ICD-10-CM

## 2022-12-12 LAB — CBC WITH DIFFERENTIAL/PLATELET
Abs Immature Granulocytes: 0.05 10*3/uL (ref 0.00–0.07)
Basophils Absolute: 0 10*3/uL (ref 0.0–0.1)
Basophils Relative: 1 %
Eosinophils Absolute: 0 10*3/uL (ref 0.0–0.5)
Eosinophils Relative: 1 %
HCT: 34 % — ABNORMAL LOW (ref 39.0–52.0)
Hemoglobin: 10.8 g/dL — ABNORMAL LOW (ref 13.0–17.0)
Immature Granulocytes: 1 %
Lymphocytes Relative: 7 %
Lymphs Abs: 0.3 10*3/uL — ABNORMAL LOW (ref 0.7–4.0)
MCH: 26.1 pg (ref 26.0–34.0)
MCHC: 31.8 g/dL (ref 30.0–36.0)
MCV: 82.1 fL (ref 80.0–100.0)
Monocytes Absolute: 0.5 10*3/uL (ref 0.1–1.0)
Monocytes Relative: 12 %
Neutro Abs: 3.5 10*3/uL (ref 1.7–7.7)
Neutrophils Relative %: 78 %
Platelets: 151 10*3/uL (ref 150–400)
RBC: 4.14 MIL/uL — ABNORMAL LOW (ref 4.22–5.81)
RDW: 15.5 % (ref 11.5–15.5)
WBC: 4.4 10*3/uL (ref 4.0–10.5)
nRBC: 0 % (ref 0.0–0.2)

## 2022-12-12 LAB — RESP PANEL BY RT-PCR (RSV, FLU A&B, COVID)  RVPGX2
Influenza A by PCR: NEGATIVE
Influenza B by PCR: NEGATIVE
Resp Syncytial Virus by PCR: NEGATIVE
SARS Coronavirus 2 by RT PCR: NEGATIVE

## 2022-12-12 LAB — COMPREHENSIVE METABOLIC PANEL
ALT: 43 U/L (ref 0–44)
AST: 26 U/L (ref 15–41)
Albumin: 3.3 g/dL — ABNORMAL LOW (ref 3.5–5.0)
Alkaline Phosphatase: 74 U/L (ref 38–126)
Anion gap: 13 (ref 5–15)
BUN: 40 mg/dL — ABNORMAL HIGH (ref 8–23)
CO2: 24 mmol/L (ref 22–32)
Calcium: 8.4 mg/dL — ABNORMAL LOW (ref 8.9–10.3)
Chloride: 98 mmol/L (ref 98–111)
Creatinine, Ser: 2.23 mg/dL — ABNORMAL HIGH (ref 0.61–1.24)
GFR, Estimated: 31 mL/min — ABNORMAL LOW (ref >60)
Glucose, Bld: 95 mg/dL (ref 70–99)
Potassium: 3.7 mmol/L (ref 3.5–5.1)
Sodium: 135 mmol/L (ref 135–145)
Total Bilirubin: 0.8 mg/dL (ref 0.3–1.2)
Total Protein: 6.6 g/dL (ref 6.5–8.1)

## 2022-12-12 LAB — PROTIME-INR
INR: 1.2 (ref 0.8–1.2)
Prothrombin Time: 14.9 seconds (ref 11.4–15.2)

## 2022-12-12 LAB — URINALYSIS, ROUTINE W REFLEX MICROSCOPIC
Bilirubin Urine: NEGATIVE
Glucose, UA: NEGATIVE mg/dL
Hgb urine dipstick: NEGATIVE
Ketones, ur: 20 mg/dL — AB
Leukocytes,Ua: NEGATIVE
Nitrite: NEGATIVE
Protein, ur: NEGATIVE mg/dL
Specific Gravity, Urine: >1.046 — ABNORMAL HIGH (ref 1.005–1.030)
pH: 5 (ref 5.0–8.0)

## 2022-12-12 LAB — APTT: aPTT: 35 seconds (ref 24–36)

## 2022-12-12 LAB — TROPONIN I (HIGH SENSITIVITY)
Troponin I (High Sensitivity): 12 ng/L (ref <18)
Troponin I (High Sensitivity): 14 ng/L (ref <18)

## 2022-12-12 LAB — LACTIC ACID, PLASMA
Lactic Acid, Venous: 1 mmol/L (ref 0.5–1.9)
Lactic Acid, Venous: 4.8 mmol/L (ref 0.5–1.9)
Lactic Acid, Venous: 1.4 mmol/L (ref 0.5–1.9)

## 2022-12-12 LAB — BRAIN NATRIURETIC PEPTIDE: B Natriuretic Peptide: 62.8 pg/mL (ref 0.0–100.0)

## 2022-12-12 MED ORDER — PANTOPRAZOLE SODIUM 40 MG IV SOLR
40.0000 mg | Freq: Every day | INTRAVENOUS | Status: DC
  Administered 2022-12-12: 40 mg via INTRAVENOUS
  Filled 2022-12-12: qty 10

## 2022-12-12 MED ORDER — TBO-FILGRASTIM 300 MCG/0.5ML ~~LOC~~ SOSY: 480.0000 ug | PREFILLED_SYRINGE | SUBCUTANEOUS | Status: DC

## 2022-12-12 MED ORDER — LACTATED RINGERS IV BOLUS (SEPSIS)
1000.0000 mL | Freq: Once | INTRAVENOUS | Status: AC
  Administered 2022-12-12: 1000 mL via INTRAVENOUS

## 2022-12-12 MED ORDER — LACTATED RINGERS IV SOLN: INTRAVENOUS | Status: AC

## 2022-12-12 MED ORDER — ACETAMINOPHEN 650 MG RE SUPP: 650.0000 mg | Freq: Four times a day (QID) | RECTAL | Status: DC | PRN

## 2022-12-12 MED ORDER — FILGRASTIM-SNDZ 480 MCG/0.8ML IJ SOSY
480.0000 ug | PREFILLED_SYRINGE | Freq: Once | INTRAMUSCULAR | Status: AC
  Administered 2022-12-12: 480 ug via SUBCUTANEOUS
  Filled 2022-12-12: qty 0.8

## 2022-12-12 MED ORDER — DIPHENOXYLATE-ATROPINE 2.5-0.025 MG PO TABS: 1.0000 | ORAL_TABLET | ORAL | Status: DC | PRN

## 2022-12-12 MED ORDER — ONDANSETRON HCL 4 MG/2ML IJ SOLN
4.0000 mg | Freq: Four times a day (QID) | INTRAMUSCULAR | Status: DC | PRN
  Administered 2022-12-13 – 2022-12-14 (×4): 4 mg via INTRAVENOUS
  Filled 2022-12-12 (×4): qty 2

## 2022-12-12 MED ORDER — LACTATED RINGERS IV BOLUS
1000.0000 mL | Freq: Once | INTRAVENOUS | Status: AC
  Administered 2022-12-12: 1000 mL via INTRAVENOUS

## 2022-12-12 MED ORDER — DIPHENOXYLATE-ATROPINE 2.5-0.025 MG PO TABS: 1.0000 | ORAL_TABLET | ORAL | 0 refills | Status: DC | PRN

## 2022-12-12 MED ORDER — ONDANSETRON HCL 4 MG PO TABS: 4.0000 mg | ORAL_TABLET | Freq: Four times a day (QID) | ORAL | Status: DC | PRN

## 2022-12-12 MED ORDER — ALLOPURINOL 300 MG PO TABS
300.0000 mg | ORAL_TABLET | Freq: Every day | ORAL | Status: DC
  Administered 2022-12-13 – 2022-12-15 (×3): 300 mg via ORAL
  Filled 2022-12-12 (×3): qty 1

## 2022-12-12 MED ORDER — GABAPENTIN 300 MG PO CAPS
900.0000 mg | ORAL_CAPSULE | Freq: Every day | ORAL | Status: DC
  Administered 2022-12-12 – 2022-12-14 (×3): 900 mg via ORAL
  Filled 2022-12-12 (×3): qty 3

## 2022-12-12 MED ORDER — SODIUM CHLORIDE 0.9 % IV SOLN: 2.0000 g | Freq: Two times a day (BID) | INTRAVENOUS | Status: DC

## 2022-12-12 MED ORDER — ORAL CARE MOUTH RINSE: 15.0000 mL | OROMUCOSAL | Status: DC | PRN

## 2022-12-12 MED ORDER — ONDANSETRON HCL 4 MG/2ML IJ SOLN
4.0000 mg | Freq: Once | INTRAMUSCULAR | Status: AC
  Administered 2022-12-12: 4 mg via INTRAVENOUS
  Filled 2022-12-12: qty 2

## 2022-12-12 MED ORDER — IOHEXOL 350 MG/ML SOLN
80.0000 mL | Freq: Once | INTRAVENOUS | Status: AC | PRN
  Administered 2022-12-12: 80 mL via INTRAVENOUS

## 2022-12-12 MED ORDER — HYDROMORPHONE HCL 2 MG PO TABS
8.0000 mg | ORAL_TABLET | ORAL | Status: DC | PRN
  Administered 2022-12-12 – 2022-12-13 (×4): 8 mg via ORAL
  Filled 2022-12-12 (×4): qty 4

## 2022-12-12 MED ORDER — TBO-FILGRASTIM 480 MCG/0.8ML ~~LOC~~ SOSY
480.0000 ug | PREFILLED_SYRINGE | Freq: Once | SUBCUTANEOUS | Status: AC
  Administered 2022-12-13: 480 ug via SUBCUTANEOUS
  Filled 2022-12-12: qty 0.8
  Filled 2022-12-12: qty 1

## 2022-12-12 MED ORDER — FENTANYL 50 MCG/HR TD PT72
1.0000 | MEDICATED_PATCH | TRANSDERMAL | Status: DC
  Administered 2022-12-13: 1 via TRANSDERMAL
  Filled 2022-12-12: qty 1

## 2022-12-12 MED ORDER — SODIUM CHLORIDE 0.9 % IV SOLN
2.0000 g | Freq: Once | INTRAVENOUS | Status: AC
  Administered 2022-12-12: 2 g via INTRAVENOUS
  Filled 2022-12-12: qty 12.5

## 2022-12-12 MED ORDER — HYDROMORPHONE HCL 1 MG/ML IJ SOLN
1.0000 mg | Freq: Once | INTRAMUSCULAR | Status: AC
  Administered 2022-12-12: 1 mg via INTRAVENOUS
  Filled 2022-12-12: qty 1

## 2022-12-12 MED ORDER — METRONIDAZOLE 500 MG/100ML IV SOLN
500.0000 mg | Freq: Once | INTRAVENOUS | Status: AC
  Administered 2022-12-12: 500 mg via INTRAVENOUS
  Filled 2022-12-12: qty 100

## 2022-12-12 MED ORDER — HYDROMORPHONE HCL 1 MG/ML IJ SOLN: 1.0000 mg | INTRAMUSCULAR | Status: DC | PRN

## 2022-12-12 MED ORDER — METRONIDAZOLE 500 MG/100ML IV SOLN
500.0000 mg | Freq: Two times a day (BID) | INTRAVENOUS | Status: DC
  Administered 2022-12-12 – 2022-12-14 (×5): 500 mg via INTRAVENOUS
  Filled 2022-12-12 (×6): qty 100

## 2022-12-12 MED ORDER — PANTOPRAZOLE SODIUM 40 MG PO TBEC
40.0000 mg | DELAYED_RELEASE_TABLET | Freq: Two times a day (BID) | ORAL | Status: DC
  Administered 2022-12-12 – 2022-12-15 (×6): 40 mg via ORAL
  Filled 2022-12-12 (×6): qty 1

## 2022-12-12 MED ORDER — SODIUM CHLORIDE 0.9 % IV SOLN
2.0000 g | Freq: Two times a day (BID) | INTRAVENOUS | Status: DC
  Administered 2022-12-12: 2 g via INTRAVENOUS
  Filled 2022-12-12: qty 12.5

## 2022-12-12 MED ORDER — ACETAMINOPHEN 325 MG PO TABS
650.0000 mg | ORAL_TABLET | Freq: Four times a day (QID) | ORAL | Status: DC | PRN
  Administered 2022-12-12 – 2022-12-15 (×7): 650 mg via ORAL
  Filled 2022-12-12 (×7): qty 2

## 2022-12-12 NOTE — ED Provider Notes (Addendum)
= [facility] Provider Note  CSN: [id number] Arrival date & time: 12/12/22 1105  Chief Complaint(s) Fever  HPI Thomas [name] is a 72 y.o. male with past medical history as below, significant for Adenocarcinoma of right lung stage 4, With multiple osseous metastatic Lesions, malignant right-sided pleural effusion.  Dr Sondra Come palliative radiation R hip, tx > Carboplatin, Alimta, Keytruda [facility] Dr Ready  who presents to the ED with complaint of fever, diarrhea, tachycardia, malaise.  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  He was at oncology office getting chemo treatment today when nursing noticed that he was febrile 101, tachycardic, hypotensive explained.  Sent to the ED for evaluation.  Patient with paroxysms of nighttime diarrhea over the past 2 days.  Nausea without vomiting, abdominal pain primarily right lower quadrant right upper quadrant.  No dyspnea or palpitations.  Compliant with home medications.  Does report feeling lightheaded, mild headache, nausea.  No recent falls.  Denies any recent antibiotics, denies history of C. difficile, stool is mostly liquid, brown, no BRBPR or melena  accompanied by spouse who assists with history     Past Medical History Past Medical History:  Diagnosis Date   Abdominal wall pain 04/02/2021   Acute ischemic stroke ([place]) 03/22/2020   Avulsed toenail, initial encounter 05/04/2018   Dyspnea on exertion 02/10/2020   ED (erectile dysfunction)    Epidermoid cyst of neck 10/16/2017   Essential hypertension 07/21/2017   GERD (gastroesophageal reflux disease)    Gout of multiple sites 07/21/2017   History of kidney stones    History of radiation therapy    Right Chest, Right Pelvis- 10/30/22-11/13/22- Sr. 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   Urgency of urination    URI (upper respiratory infection) 11/08/2014   Wears glasses    Patient Active Problem List   Diagnosis Date Noted   Sepsis due to undetermined organism ([place]) 12/12/2022   Neutropenia ([place]) 12/10/2022   Adenocarcinoma of right lung, stage 4 ([place]) 11/26/2022   Lytic bone lesion of hip 10/28/2022   Penetrating atherosclerotic ulcer of aorta ([place]) 10/28/2022   Intractable pain 10/27/2022   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   Preoperative cardiovascular examination 06/05/2022   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   Primary osteoarthritis of left hip 03/19/2022   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   Chronic gouty arthritis 04/23/2021   Primary osteoarthritis 04/23/2021   Abdominal wall pain 04/02/2021   Dysphagia 04/02/2021   Right flank pain 04/02/2021   Wears glasses    Left ureteral calculus    Hypertension    Hyperlipidemia    History of squamous cell carcinoma excision    Chronic gout    Right sided abdominal pain 01/14/2021   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/24/2020   Acute ischemic stroke ([place]) 03/22/2020   It band syndrome, left 03/19/2020   Metatarsalgia of both feet 03/19/2020   Superior mesenteric artery stenosis (  [place]) 02/24/2020   Polyarthralgia 07/16/2018   Essential hypertension 07/21/2017   Gout of multiple sites 07/21/2017   Home Medication(s) Prior to Admission medications   Medication Sig Start Date End Date Taking? Authorizing Provider  acetaminophen (TYLENOL) 650 MG CR tablet Take 1,300 mg by mouth in the morning and at bedtime.     [provider]  allopurinol (ZYLOPRIM) 300 MG tablet Take 300 mg by mouth daily. 05/14/20   [provider]  amLODipine (NORVASC) 5 MG tablet Take 1 tablet (5 mg total) by mouth daily. Patient taking differently: Take 5 mg by mouth every evening. 06/06/22   Richardson Dopp T, PA-C  dexamethasone (DECADRON) 2 MG tablet Take 2 tablets (4mg ) for 7 days. Then decrease to 1 (2mg ) tablet for 7 days. Then decrease to 1 (2mg ) tablet every other day. 11/12/22   Pickenpack-Cousar, Carlena Sax, NP  diphenoxylate-atropine (LOMOTIL) 2.5-0.025 MG tablet Take 1 tablet by mouth every 4 (four) hours as needed for diarrhea or loose stools. Take 2 tablets after the first stool, then take as directed. 12/12/22   Pickenpack-Cousar, Carlena Sax, NP  DULoxetine (CYMBALTA) 30 MG capsule Take 1 capsule (30 mg total) by mouth daily. Patient not taking: Reported on 12/10/2022 11/19/22   Pickenpack-Cousar, Carlena Sax, NP  Evolocumab (REPATHA SURECLICK) 672 MG/ML SOAJ Inject 140 mg into the skin every 14 (fourteen) days. 12/02/22   Park Liter, MD  fentaNYL (DURAGESIC) 50 MCG/HR Place 1 patch onto the skin every 3 (three) days. 12/12/22   Pickenpack-Cousar, Carlena Sax, NP  folic acid (FOLVITE) 1 MG tablet Take 1 tablet (1 mg total) by mouth daily. 11/26/22   Curt Bears, MD  gabapentin (NEURONTIN) 300 MG capsule Take 900 mg by mouth at bedtime.    [provider]  HYDROmorphone (DILAUDID) 8 MG tablet Take 1 tablet (8 mg total) by mouth every 4 (four) hours as needed for severe pain. 12/08/22   Pickenpack-Cousar, Carlena Sax, NP  ibuprofen (ADVIL) 200 MG tablet Take 600 mg by mouth every 8 (eight) hours as needed for moderate pain.    [provider]  methocarbamol (ROBAXIN) 750 MG tablet Take 750 mg by mouth every 8 (eight) hours as needed for muscle spasms. 11/11/22   [provider]  minoxidil (LONITEN) 10 MG tablet Take 1-1.5 tablets (10-15 mg total) by mouth 2 (two) times daily. TAKE 1  TABLET IN THE MORNING AND TAKE 1/2 TABLET AT NIGHT Patient taking differently: Take 10 mg by mouth 2 (two) times daily. 09/12/22   Park Liter, MD  ondansetron (ZOFRAN-ODT) 4 MG disintegrating tablet Take 1-2 tablets (4-8 mg total) by mouth every 6 (six) hours as needed for nausea or vomiting. 11/12/22   Pickenpack-Cousar, Carlena Sax, NP  pantoprazole (PROTONIX) 40 MG tablet Take 1 tablet (40 mg total) by mouth 2 (two) times daily. 11/26/22   Pickenpack-Cousar, Carlena Sax, NP  polyethylene glycol (MIRALAX / GLYCOLAX) 17 g packet Take 17 g by mouth 2 (two) times daily. 11/02/22   Antonieta Pert, MD  PRESCRIPTION MEDICATION Apply 1 Application topically at bedtime as needed (pain). Compounded pain cream ([place])    [provider]  prochlorperazine (COMPAZINE) 10 MG tablet Take 1 tablet (10 mg total) by mouth every 6 (six) hours as needed for nausea or vomiting. 12/03/22   Curt Bears, MD  senna (SENOKOT) 8.6 MG TABS tablet Take 2 tablets (17.2 mg total) by mouth 2 (two) times daily. 11/27/22   Pickenpack-Cousar, Carlena Sax, NP  valsartan (  DIOVAN) 320 MG tablet TAKE 1 TABLET BY MOUTH EVERY DAY Patient taking differently: Take 320 mg by mouth daily. 08/20/22   Park Liter, MD                                                                                                                                    Past Surgical History Past Surgical History:  Procedure Laterality Date   COLONOSCOPY WITH PROPOFOL  2017   CYSTOSCOPY/RETROGRADE/URETEROSCOPY/STONE EXTRACTION WITH BASKET Left 04/16/2018   Procedure: CYSTOSCOPY/RETROGRADE/URETEROSCOPY/STONE EXTRACTION WITH BASKET/ HOLMIUM LASER LITHOTRIPSY/ STENT PLACEMENT;  Surgeon: Ardis Hughs, MD;  Location: [facility];  Service: Urology;  Laterality: Left;   HERNIA REPAIR Right 2022   inguinal   HOLMIUM LASER APPLICATION Left 67/89/3810   Procedure: HOLMIUM LASER APPLICATION;  Surgeon: Ardis Hughs,  MD;  Location: [facility];  Service: Urology;  Laterality: Left;   TOTAL HIP ARTHROPLASTY Right 06/30/2022   Procedure: RIGHT TOTAL HIP ARTHROPLASTY ANTERIOR APPROACH;  Surgeon: Leandrew Koyanagi, MD;  Location: [facility];  Service: Orthopedics;  Laterality: Right;  3-C   Family History Family History  Problem Relation Age of Onset   Heart disease Mother 36   Alcohol abuse Mother    Depression Mother    Hypertension Father    Sleep apnea Father    Cancer Sister        hodgkins, breast   Colon cancer Neg Hx    Pancreatic cancer Neg Hx    Rectal cancer Neg Hx    Stomach cancer Neg Hx    Esophageal cancer Neg Hx    Inflammatory bowel disease Neg Hx    Liver disease Neg Hx     Social History Social History   Tobacco Use   Smoking status: Former    Packs/day: 0.30    Years: 20.00    Total pack years: 6.00    Types: Cigarettes    Quit date: 10/17/1994    Years since quitting: 28.1   Smokeless tobacco: Never  Substance Use Topics   Alcohol use: Not Currently   Drug use: No   Allergies Statins  Review of Systems Review of Systems  Constitutional:  Positive for fatigue and fever. Negative for chills.  HENT:  Negative for facial swelling and trouble swallowing.   Eyes:  Negative for photophobia and discharge.  Respiratory:  Negative for cough and shortness of breath.   Cardiovascular:  Negative for chest pain and palpitations.  Gastrointestinal:  Positive for abdominal pain, diarrhea and nausea. Negative for vomiting.  Endocrine: Negative for polydipsia and polyuria.  Genitourinary:  Negative for difficulty urinating and hematuria.  Musculoskeletal:  Negative for gait problem and joint swelling.  Skin:  Negative for pallor and rash.  Neurological:  Positive for light-headedness and headaches. Negative for syncope.  Psychiatric/Behavioral:  Negative for agitation and confusion.     Physical Exam Vital Signs  I have  reviewed the triage vital signs BP (!) 111/52    Pulse (!) 120   Temp 98.4 F (36.9 C) (Oral)   Resp 20   SpO2 92%  Physical Exam Vitals and nursing note reviewed.  Constitutional:      General: He is not in acute distress.    Appearance: Normal appearance. He is well-developed. He is not ill-appearing or diaphoretic.  HENT:     Head: Normocephalic and atraumatic. No raccoon eyes, Battle's sign, right periorbital erythema or left periorbital erythema.     Right Ear: External ear normal.     Left Ear: External ear normal.     Mouth/Throat:     Mouth: Mucous membranes are moist.  Eyes:     General: No scleral icterus.    Extraocular Movements: Extraocular movements intact.     Pupils: Pupils are equal, round, and reactive to light.  Cardiovascular:     Rate and Rhythm: Regular rhythm. Tachycardia present.     Pulses: Normal pulses.     Heart sounds: Normal heart sounds.  Pulmonary:     Effort: Pulmonary effort is normal. No respiratory distress.     Breath sounds: Normal breath sounds.  Abdominal:     General: Abdomen is flat.     Palpations: Abdomen is soft.     Tenderness: There is no abdominal tenderness.  Musculoskeletal:        General: Normal range of motion.     Cervical back: No rigidity.     Right lower leg: No edema.     Left lower leg: No edema.  Skin:    General: Skin is warm and dry.     Capillary Refill: Capillary refill takes less than 2 seconds.  Neurological:     Mental Status: He is alert and oriented to person, place, and time.     GCS: GCS eye subscore is 4. GCS verbal subscore is 5. GCS motor subscore is 6.     Cranial Nerves: Cranial nerves 2-12 are intact.     Sensory: Sensation is intact.     Motor: Motor function is intact.     Coordination: Coordination is intact.     Comments: Gait not tested secondary to patient safety  Strength 5/5 BLUE BLLE  Psychiatric:        Mood and Affect: Mood normal.        Behavior: Behavior normal.     ED Results and Treatments Labs (all labs ordered  are listed, but only abnormal results are displayed) Labs Reviewed  COMPREHENSIVE METABOLIC PANEL - Abnormal; Notable for the following components:      Result Value   BUN 40 (*)    Creatinine, Ser 2.23 (*)    Calcium 8.4 (*)    Albumin 3.3 (*)    GFR, Estimated 31 (*)    All other components within normal limits  CBC WITH DIFFERENTIAL/PLATELET - Abnormal; Notable for the following components:   RBC 4.14 (*)    Hemoglobin 10.8 (*)    HCT 34.0 (*)    Lymphs Abs 0.3 (*)    All other components within normal limits  URINALYSIS, ROUTINE W REFLEX MICROSCOPIC - Abnormal; Notable for the following components:   Specific Gravity, Urine >1.046 (*)    Ketones, ur 20 (*)    All other components within normal limits  RESP PANEL BY RT-PCR (RSV, FLU A&B, COVID)  RVPGX2  CULTURE, BLOOD (ROUTINE X 2)  CULTURE, BLOOD (ROUTINE X 2)  LACTIC ACID, PLASMA  PROTIME-INR  APTT  BRAIN NATRIURETIC PEPTIDE  LACTIC ACID, PLASMA  TROPONIN I (HIGH SENSITIVITY)  TROPONIN I (HIGH SENSITIVITY)                                                                                                                          Radiology CT Angio Chest PE W and/or Wo Contrast  Result Date: 12/12/2022 CLINICAL DATA:  Pulmonary embolism (PE) suspected, high prob; RLQ abdominal pain n/v/d, abd pain RLQ, lung ca. History of lung cancer on chemotherapy EXAM: CT ANGIOGRAPHY CHEST CT ABDOMEN AND PELVIS WITH CONTRAST TECHNIQUE: Multidetector CT imaging of the chest was performed using the standard protocol during bolus administration of intravenous contrast. Multiplanar CT image reconstructions and MIPs were obtained to evaluate the vascular anatomy. Multidetector CT imaging of the abdomen and pelvis was performed using the standard protocol during bolus administration of intravenous contrast. RADIATION DOSE REDUCTION: This exam was performed according to the departmental dose-optimization program which includes automated exposure  control, adjustment of the mA and/or kV according to patient size and/or use of iterative reconstruction technique. CONTRAST:  69mL OMNIPAQUE IOHEXOL 350 MG/ML SOLN COMPARISON:  CT 10/28/2022, PET-CT 11/20/2022 FINDINGS: CTA CHEST FINDINGS Cardiovascular: Suboptimal evaluation of the pulmonary arteries secondary to contrast bolus timing, motion artifact, as well as beam hardening artifact secondary to patient's arms positioned against the body. Within this limitation, there is no evidence of a large or central pulmonary embolism. There is tumor encasement of the right upper lobe pulmonary artery which is severely narrowed (series 8, image 79). Heart size is within normal limits. No pericardial effusion. Thoracic aorta is nonaneurysmal. Atherosclerotic calcifications of the aorta and coronary arteries. Mediastinum/Nodes: Metastatic right paratracheal and right hilar lymphadenopathy, not appreciably changed in size from the recent PET-CT. No axillary or left hilar lymphadenopathy. Thyroid, trachea, and esophagus within normal limits. Lungs/Pleura: 1.6 cm pleural based pulmonary nodule in the medial right upper lobe, unchanged (series 7, image 55). Small right pleural effusion with areas of pleural metastatic disease at the lung bases. Left lung remains clear. No pneumothorax. Musculoskeletal: Erosive changes of the posterior right ninth rib, similar. No pathologic fracture. No new lytic or sclerotic bone lesions are identified. Review of the MIP images confirms the above findings. CT ABDOMEN and PELVIS FINDINGS Hepatobiliary: No new liver abnormality or evidence of hepatic metastatic disease. Moderately dilated gallbladder without hyperdense gallstone or pericholecystic inflammatory changes. No biliary dilatation. Pancreas: Unremarkable. No pancreatic ductal dilatation or surrounding inflammatory changes. Spleen: Normal in size without focal abnormality. Adrenals/Urinary Tract: No adrenal nodule. No solid renal mass.  No stone or hydronephrosis. Urinary bladder is obscured by metal artifact from patient's hip prosthesis. Stomach/Bowel: Stomach within normal limits. No abnormally dilated loops of bowel. Colonic diverticulosis with subtle pericolonic stranding of the proximal sigmoid colon (series 3, image 79). No pericolonic fluid collection or extraluminal air. Vascular/Lymphatic: Aortic atherosclerosis. Nonenlarged celiac chain lymph nodes, noted to be hypermetabolic on PET-CT (series 3, image 32). No new or  enlarging abdominopelvic lymph nodes. Reproductive: Prostate largely obscured by streak artifact. Other: No free fluid. No abdominopelvic fluid collection. No pneumoperitoneum. Tiny fat containing umbilical hernia. Musculoskeletal: Destructive mass of the posterior right iliac bone with extraosseous soft tissue component (series 3, image 65), not significantly changed. No new bone lesion. Review of the MIP images confirms the above findings. IMPRESSION: 1. Suboptimal evaluation of the pulmonary arteries. See above discussion. Within this limitation, there is no evidence of a large or central pulmonary embolism. There is tumor encasement of the right upper lobe pulmonary artery which is severely narrowed. 2. No significant interval progression of known metastatic lung cancer including right upper lobe lung lesion, mediastinal and right hilar lymphadenopathy, retroperitoneal lymph node involvement, as well as osseous metastatic involvement of the right ninth rib and right iliac bone. 3. Colonic diverticulosis with subtle pericolonic stranding of the proximal sigmoid colon, suggestive of mild acute uncomplicated diverticulitis. 4. Aortic atherosclerosis (ICD10-I70.0). Electronically Signed   By: Davina Poke D.O.   On: 12/12/2022 14:13   CT ABDOMEN PELVIS W CONTRAST  Result Date: 12/12/2022 CLINICAL DATA:  Pulmonary embolism (PE) suspected, high prob; RLQ abdominal pain n/v/d, abd pain RLQ, lung ca. History of lung  cancer on chemotherapy EXAM: CT ANGIOGRAPHY CHEST CT ABDOMEN AND PELVIS WITH CONTRAST TECHNIQUE: Multidetector CT imaging of the chest was performed using the standard protocol during bolus administration of intravenous contrast. Multiplanar CT image reconstructions and MIPs were obtained to evaluate the vascular anatomy. Multidetector CT imaging of the abdomen and pelvis was performed using the standard protocol during bolus administration of intravenous contrast. RADIATION DOSE REDUCTION: This exam was performed according to the departmental dose-optimization program which includes automated exposure control, adjustment of the mA and/or kV according to patient size and/or use of iterative reconstruction technique. CONTRAST:  23mL OMNIPAQUE IOHEXOL 350 MG/ML SOLN COMPARISON:  CT 10/28/2022, PET-CT 11/20/2022 FINDINGS: CTA CHEST FINDINGS Cardiovascular: Suboptimal evaluation of the pulmonary arteries secondary to contrast bolus timing, motion artifact, as well as beam hardening artifact secondary to patient's arms positioned against the body. Within this limitation, there is no evidence of a large or central pulmonary embolism. There is tumor encasement of the right upper lobe pulmonary artery which is severely narrowed (series 8, image 79). Heart size is within normal limits. No pericardial effusion. Thoracic aorta is nonaneurysmal. Atherosclerotic calcifications of the aorta and coronary arteries. Mediastinum/Nodes: Metastatic right paratracheal and right hilar lymphadenopathy, not appreciably changed in size from the recent PET-CT. No axillary or left hilar lymphadenopathy. Thyroid, trachea, and esophagus within normal limits. Lungs/Pleura: 1.6 cm pleural based pulmonary nodule in the medial right upper lobe, unchanged (series 7, image 55). Small right pleural effusion with areas of pleural metastatic disease at the lung bases. Left lung remains clear. No pneumothorax. Musculoskeletal: Erosive changes of the  posterior right ninth rib, similar. No pathologic fracture. No new lytic or sclerotic bone lesions are identified. Review of the MIP images confirms the above findings. CT ABDOMEN and PELVIS FINDINGS Hepatobiliary: No new liver abnormality or evidence of hepatic metastatic disease. Moderately dilated gallbladder without hyperdense gallstone or pericholecystic inflammatory changes. No biliary dilatation. Pancreas: Unremarkable. No pancreatic ductal dilatation or surrounding inflammatory changes. Spleen: Normal in size without focal abnormality. Adrenals/Urinary Tract: No adrenal nodule. No solid renal mass. No stone or hydronephrosis. Urinary bladder is obscured by metal artifact from patient's hip prosthesis. Stomach/Bowel: Stomach within normal limits. No abnormally dilated loops of bowel. Colonic diverticulosis with subtle pericolonic stranding of the proximal sigmoid colon (  series 3, image 79). No pericolonic fluid collection or extraluminal air. Vascular/Lymphatic: Aortic atherosclerosis. Nonenlarged celiac chain lymph nodes, noted to be hypermetabolic on PET-CT (series 3, image 32). No new or enlarging abdominopelvic lymph nodes. Reproductive: Prostate largely obscured by streak artifact. Other: No free fluid. No abdominopelvic fluid collection. No pneumoperitoneum. Tiny fat containing umbilical hernia. Musculoskeletal: Destructive mass of the posterior right iliac bone with extraosseous soft tissue component (series 3, image 65), not significantly changed. No new bone lesion. Review of the MIP images confirms the above findings. IMPRESSION: 1. Suboptimal evaluation of the pulmonary arteries. See above discussion. Within this limitation, there is no evidence of a large or central pulmonary embolism. There is tumor encasement of the right upper lobe pulmonary artery which is severely narrowed. 2. No significant interval progression of known metastatic lung cancer including right upper lobe lung lesion,  mediastinal and right hilar lymphadenopathy, retroperitoneal lymph node involvement, as well as osseous metastatic involvement of the right ninth rib and right iliac bone. 3. Colonic diverticulosis with subtle pericolonic stranding of the proximal sigmoid colon, suggestive of mild acute uncomplicated diverticulitis. 4. Aortic atherosclerosis (ICD10-I70.0). Electronically Signed   By: Davina Poke D.O.   On: 12/12/2022 14:13   CT Head Wo Contrast  Result Date: 12/12/2022 CLINICAL DATA:  Headache. EXAM: CT HEAD WITHOUT CONTRAST TECHNIQUE: Contiguous axial images were obtained from the base of the skull through the vertex without intravenous contrast. RADIATION DOSE REDUCTION: This exam was performed according to the departmental dose-optimization program which includes automated exposure control, adjustment of the mA and/or kV according to patient size and/or use of iterative reconstruction technique. COMPARISON:  MRI head 10/29/2022. FINDINGS: Brain: No evidence of acute infarction, hemorrhage, hydrocephalus, extra-axial collection or mass lesion/mass effect. Patchy white matter hypodensities, nonspecific but compatible with chronic microvascular ischemic disease. Vascular: No hyperdense vessel identified. Skull: No acute fracture. Sinuses/Orbits: Largely clear sinuses.  No acute orbital findings. Other: No mastoid effusions. IMPRESSION: No evidence of acute intracranial abnormality. Electronically Signed   By: Margaretha Sheffield M.D.   On: 12/12/2022 14:02   DG Chest Port 1 View  Result Date: 12/12/2022 CLINICAL DATA:  Fever, hypotension and diarrhea. Started chemotherapy yesterday for stage IV lung cancer. EXAM: PORTABLE CHEST 1 VIEW COMPARISON:  03/22/2020.  Chest CTA dated 11/08/2022 FINDINGS: Normal sized heart. Mildly tortuous and mildly calcified thoracic aorta. Ill-defined increased density at the medial aspect of the right upper lobe compatible with the patient's known malignancy and associated  metastatic adenopathy seen on the chest CTA. Additional small nodular density in the lateral aspect of the right upper lung zone with no corresponding abnormality on the previous CT. Possible small nodular density in the lateral aspect of the left upper lung zone in area of overlapping rib and scapula, not previously seen. Otherwise, clear lungs. No pleural fluid. Thoracic spine degenerative changes. IMPRESSION: 1. No acute abnormality. 2. Ill-defined increased density at the medial aspect of the right upper lobe compatible with the patient's known malignancy and associated metastatic adenopathy seen on the previous chest CTA. 3. Possible interval small nodule in the lateral aspect of each upper lung zone, potentially representing small metastases. Electronically Signed   By: Claudie Revering M.D.   On: 12/12/2022 12:14    Pertinent labs & imaging results that were available during my care of the patient were reviewed by me and considered in my medical decision making (see MDM for details).  Medications Ordered in ED Medications  lactated ringers infusion ( Intravenous New  Bag/Given 12/12/22 1353)  metroNIDAZOLE (FLAGYL) IVPB 500 mg (500 mg Intravenous New Bag/Given 12/12/22 1445)  lactated ringers bolus 1,000 mL (has no administration in time range)  metroNIDAZOLE (FLAGYL) IVPB 500 mg (has no administration in time range)  lactated ringers bolus 1,000 mL (has no administration in time range)  ceFEPIme (MAXIPIME) 2 g in sodium chloride 0.9 % 100 mL IVPB (has no administration in time range)  acetaminophen (TYLENOL) tablet 650 mg (has no administration in time range)    Or  acetaminophen (TYLENOL) suppository 650 mg (has no administration in time range)  HYDROmorphone (DILAUDID) injection 1 mg (has no administration in time range)  ondansetron (ZOFRAN) tablet 4 mg (has no administration in time range)    Or  ondansetron (ZOFRAN) injection 4 mg (has no administration in time range)  pantoprazole  (PROTONIX) injection 40 mg (has no administration in time range)  lactated ringers bolus 1,000 mL (0 mLs Intravenous Stopped 12/12/22 1355)  HYDROmorphone (DILAUDID) injection 1 mg (1 mg Intravenous Given 12/12/22 1257)  ondansetron (ZOFRAN) injection 4 mg (4 mg Intravenous Given 12/12/22 1256)  ceFEPIme (MAXIPIME) 2 g in sodium chloride 0.9 % 100 mL IVPB (0 g Intravenous Stopped 12/12/22 1400)  iohexol (OMNIPAQUE) 350 MG/ML injection 80 mL (80 mLs Intravenous Contrast Given 12/12/22 1325)                                                                                                                                     Procedures .Critical Care  Performed by: Jeanell Sparrow, DO Authorized by: Jeanell Sparrow, DO   Critical care provider statement:    Critical care time (minutes):  30   Critical care time was exclusive of:  Separately billable procedures and treating other patients   Critical care was necessary to treat or prevent imminent or life-threatening deterioration of the following conditions:  Sepsis   Critical care was time spent personally by me on the following activities:  Development of treatment plan with patient or surrogate, discussions with consultants, evaluation of patient's response to treatment, examination of patient, ordering and review of laboratory studies, ordering and review of radiographic studies, ordering and performing treatments and interventions, pulse oximetry, re-evaluation of patient's condition, review of old charts and obtaining history from patient or surrogate   Care discussed with: admitting provider     (including critical care time)  Medical Decision Making / ED Course   MDM:  DAVIED NOCITO is a 72 y.o. male with past medical history as below, significant for Adenocarcinoma of right lung stage 4, With multiple osseous metastatic Lesions, malignant right-sided pleural effusion.  Dr Sondra Come palliative radiation R hip, tx > Carboplatin, Alimta,  Keytruda [facility] Dr Ready  who presents to the ED with complaint of fever, diarrhea, tachycardia, malaise.. The complaint involves an extensive differential diagnosis and also carries with it a high risk of complications and morbidity.  Serious  etiology was considered. Ddx includes but is not limited to: Differential diagnosis for adult fever includes but is not exclusive to community-acquired pneumonia, urinary tract infection, acute cholecystitis, viral syndrome, cellulitis, tick bourne disease,  decubitus ulcer, necrotizing fasciitis, meningitis, encephalitis, influenza, Differential diagnosis includes but is not exclusive to acute appendicitis, renal colic, testicular torsion, urinary tract infection, prostatitis,  diverticulitis, small bowel obstruction, colitis, abdominal aortic aneurysm, gastroenteritis, constipation etc.    On initial assessment the patient is: Tachycardic, not hypoxic, blood pressure stable. Vital signs and nursing notes were reviewed  Clinical Course as of 12/12/22 1501  Fri Dec 12, 2022  1436 Fever 101.2 at oncology office [SG]  1437 Creatinine(!): 2.23 Cr was 1.24 two days ago [SG]  1740 Uncomplicated diverticulitis on CTAP; will add flagyl [SG]  1441 Pt with fever, persistent tachycardia, AKI (nearly doubled his Cr), mild dehydration, uncomplicated diverticulitis; recommend admission, pt/family agreeable.  [SG]  1443 Echo 5/21 LVEF 60-65%, unable to eval diast fxn. [SG]    Clinical Course User Index [SG] Jeanell Sparrow, DO   Continue IVF, will give 2nd LR bolus  Patient persistent tachycardia, give second bolus of IV fluids, lactate is normal.  He has been criteria for sepsis, source presumably diverticulitis.  Broaden cefepime with Flagyl.  Fortunately he is not neutropenic today.  He does have AKI.  Continue IV fluids.  Given sepsis we will recommend admission.  Spoke with Dr. Olevia Bowens who accepted patient for admission     Additional history  obtained: -Additional history obtained from family -External records from outside source obtained and reviewed including: Chart review including previous notes, labs, imaging, consultation notes including prior office notes, prior labs and imaging Patient was neutropenic on recent lab draw 2 days ago Ongoing chemotherapy, last dose today   Lab Tests: -I ordered, reviewed, and interpreted labs.   The pertinent results include:   Labs Reviewed  COMPREHENSIVE METABOLIC PANEL - Abnormal; Notable for the following components:      Result Value   BUN 40 (*)    Creatinine, Ser 2.23 (*)    Calcium 8.4 (*)    Albumin 3.3 (*)    GFR, Estimated 31 (*)    All other components within normal limits  CBC WITH DIFFERENTIAL/PLATELET - Abnormal; Notable for the following components:   RBC 4.14 (*)    Hemoglobin 10.8 (*)    HCT 34.0 (*)    Lymphs Abs 0.3 (*)    All other components within normal limits  URINALYSIS, ROUTINE W REFLEX MICROSCOPIC - Abnormal; Notable for the following components:   Specific Gravity, Urine >1.046 (*)    Ketones, ur 20 (*)    All other components within normal limits  RESP PANEL BY RT-PCR (RSV, FLU A&B, COVID)  [id number]  CULTURE, BLOOD (ROUTINE X 2)  CULTURE, BLOOD (ROUTINE X 2)  LACTIC ACID, PLASMA  PROTIME-INR  APTT  BRAIN NATRIURETIC PEPTIDE  LACTIC ACID, PLASMA  TROPONIN I (HIGH SENSITIVITY)  TROPONIN I (HIGH SENSITIVITY)    Notable for as above  EKG   EKG Interpretation  Date/Time:    Ventricular Rate:    PR Interval:    QRS Duration:   QT Interval:    QTC Calculation:   R Axis:     Text Interpretation:           Imaging Studies ordered: I ordered imaging studies including CTH, CTPE, CTAP, CXR I independently visualized the following imaging with scope of interpretation limited to determining acute life threatening conditions  related to emergency care: diverticulitis, metastatic disease I independently visualized and interpreted imaging. I  agree with the radiologist interpretation   Medicines ordered and prescription drug management: Meds ordered this encounter  Medications   lactated ringers infusion   lactated ringers bolus 1,000 mL    Order Specific Question:   Reason 30 mL/kg dose is not being ordered    Answer:   First Lactic Acid Pending   HYDROmorphone (DILAUDID) injection 1 mg   ondansetron (ZOFRAN) injection 4 mg   ceFEPIme (MAXIPIME) 2 g in sodium chloride 0.9 % 100 mL IVPB    Order Specific Question:   Antibiotic Indication:    Answer:   Febrile Neutropenia   iohexol (OMNIPAQUE) 350 MG/ML injection 80 mL   metroNIDAZOLE (FLAGYL) IVPB 500 mg    Order Specific Question:   Antibiotic Indication:    Answer:   Intra-abdominal Infection   lactated ringers bolus 1,000 mL   DISCONTD: ceFEPIme (MAXIPIME) 2 g in sodium chloride 0.9 % 100 mL IVPB    Adjust dosage as needed.    Order Specific Question:   Antibiotic Indication:    Answer:   Other Indication (list below)    Order Specific Question:   Other Indication:    Answer:   Intra-abdominal   metroNIDAZOLE (FLAGYL) IVPB 500 mg    Order Specific Question:   Antibiotic Indication:    Answer:   Intra-abdominal Infection   lactated ringers bolus 1,000 mL    Order Specific Question:   Reason 30 mL/kg dose is not being ordered    Answer:   Fluid given earlier or by EMS - completing remainder of 30 mL/kg by TBW   ceFEPIme (MAXIPIME) 2 g in sodium chloride 0.9 % 100 mL IVPB    Adjust dosage as needed.    Order Specific Question:   Antibiotic Indication:    Answer:   Other Indication (list below)    Order Specific Question:   Other Indication:    Answer:   Intra-abdominal   OR Linked Order Group    acetaminophen (TYLENOL) tablet 650 mg    acetaminophen (TYLENOL) suppository 650 mg   HYDROmorphone (DILAUDID) injection 1 mg   OR Linked Order Group    ondansetron (ZOFRAN) tablet 4 mg    ondansetron (ZOFRAN) injection 4 mg   pantoprazole (PROTONIX) injection 40 mg     -I have reviewed the patients home medicines and have made adjustments as needed   Consultations Obtained: na   Cardiac Monitoring: The patient was maintained on a cardiac monitor.  I personally viewed and interpreted the cardiac monitored which showed an underlying rhythm of: sinus tachy  Social Determinants of Health:  Diagnosis or treatment significantly limited by social determinants of health: former smoker   Reevaluation: After the interventions noted above, I reevaluated the patient and found that they have improved  Co morbidities that complicate the patient evaluation  Past Medical History:  Diagnosis Date   Abdominal wall pain 04/02/2021   Acute ischemic stroke ([place]) 03/22/2020   Avulsed toenail, initial encounter 05/04/2018   Dyspnea on exertion 02/10/2020   ED (erectile dysfunction)    Epidermoid cyst of neck 10/16/2017   Essential hypertension 07/21/2017   GERD (gastroesophageal reflux disease)    Gout of multiple sites 07/21/2017   History of kidney stones    History of radiation therapy    Right Chest, Right Pelvis- 10/30/22-11/13/22- Sr. 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    Final Clinical Impression(s) / ED Diagnoses Final diagnoses:  Diverticulitis  AKI (acute kidney injury) ([place])  SIRS (systemic  inflammatory response syndrome) ([place])     This chart was dictated using voice recognition software.  Despite best efforts to proofread,  errors can occur which can change the documentation meaning.    Jeanell Sparrow, DO 12/12/22 1455    Jeanell Sparrow, DO 12/12/22 1501

## 2022-12-12 NOTE — Plan of Care (Signed)
  Problem: Respiratory: Goal: Ability to maintain adequate ventilation will improve Outcome: Progressing   Problem: Education: Goal: Knowledge of General Education information will improve Description: Including pain rating scale, medication(s)/side effects and non-pharmacologic comfort measures Outcome: Progressing   Problem: Clinical Measurements: Goal: Respiratory complications will improve Outcome: Progressing   Problem: Activity: Goal: Risk for activity intolerance will decrease Outcome: Progressing

## 2022-12-12 NOTE — Progress Notes (Signed)
Pt reporting loose stools lomotil ordered per NP, pt educated on use

## 2022-12-12 NOTE — H&P (Signed)
History and Physical    Patient: Thomas [name] LNL:[id number] DOB: 1951-01-21 DOA: 12/12/2022 DOS: the patient was seen and examined on 12/12/2022 PCP: Patient, No Pcp Per  Patient coming from: Home  Chief Complaint:  Chief Complaint  Patient presents with   Fever   HPI: [name] is a 72 y.o. male with medical history significant of abdominal wall pain, acute ischemic stroke, [place] toenail, dyspnea, rectal dysfunction, epidermoid cyst of the neck, hypertension, GERD, gout arthritis of multiple sites, nephrolithiasis, skin squamous cell carcinoma, hyperlipidemia, superior mesenteric artery stenosis who is coming to the emergency department due to fever, hypotension and tachycardia after he arrived for his filgrastim injection at the cancer center.  He has been having multiple episodes of diarrhea daily for the past 2 days associated with nausea.  He has chronic RUQ pain. No emesis, melena or hematochezia.  He denied rhinorrhea, sore throat, wheezing or hemoptysis.  No chest pain, palpitations, diaphoresis, PND, orthopnea or pitting edema of the lower extremities.  No flank pain, dysuria, frequency or hematuria.  No polyuria, polydipsia, polyphagia or blurred vision.   ED course: Initial vital signs were temperature 99.2 F, pulse 117, respirations 21, BP 123/49 mmHg O2 sat 95% on room air.  The patient received LR 2000 mL bolus, ondansetron 4 mg IVP, hydromorphone 1 mg IVP, cefepime 2 g IVPB and metronidazole 500 mg IVPB.  Lab work: CBC showed a white count of 4.4, hemoglobin 10.8 g deciliter platelets 151.  Normal PT, INR and PTT.  Negative coronavirus, influenza A/B and RSV PCR.  Normal troponin, BNP and lactic acid.  CMP showed normal electrolytes after calcium correction, BUN was 40 and creatinine 2.23 mg/dL.  His creatinine level was 1.24 mg/deciliter 2 days ago.  Albumin level is 3.3 g/dL.  The rest of the LFTs were normal.  Imaging: Portable 1 view chest radiograph with no acute  abnormality.  Possible and several small nodule in the lateral aspect of each upper lung zone, potentially representing small metastasis.  CT head without contrast with no evidence of acute intracranial normality.  CTA chest was suboptimal for PE evaluation, but with within this limitation no PE seen.  There is no significant interval progression of known metastatic lung cancer.  CT abdomen/pelvis with contrast colonic diverticulosis with subtle pericolonic stranding of the proximal sigmoid colon, suggestive of mild acute uncomplicated diverticulitis.  Aortic atherosclerosis.   Review of Systems: As mentioned in the history of present illness. All other systems reviewed and are negative.  Past Medical History:  Diagnosis Date   Abdominal wall pain 04/02/2021   Acute ischemic stroke ([place]) 03/22/2020   [place] toenail, initial encounter 05/04/2018   Dyspnea on exertion 02/10/2020   ED (erectile dysfunction)    Epidermoid cyst of neck 10/16/2017   Essential hypertension 07/21/2017   GERD (gastroesophageal reflux disease)    Gout of multiple sites 07/21/2017   History of kidney stones    History of radiation therapy    Right Chest, Right Pelvis- 10/30/22-11/13/22- Sr. Gery Pray   History of squamous cell carcinoma excision    Hyperlipidemia    Hypertension not at goal 01/19/2014   It band syndrome, left 03/19/2020   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/24/2020   Left ureteral calculus    Lower abdominal pain 08/27/2015   Metatarsalgia of both feet 03/19/2020   Multiple joint pain 07/16/2018   Pain in joint, shoulder region 12/11/2015   Right flank pain 04/02/2021   Superior mesenteric artery stenosis (  [place]) 02/14/2020   Urgency of urination    URI (upper respiratory infection) 11/08/2014   Wears glasses    Past Surgical History:  Procedure Laterality Date   COLONOSCOPY WITH PROPOFOL  2017   CYSTOSCOPY/RETROGRADE/URETEROSCOPY/STONE EXTRACTION WITH BASKET Left 04/16/2018    Procedure: CYSTOSCOPY/RETROGRADE/URETEROSCOPY/STONE EXTRACTION WITH BASKET/ HOLMIUM LASER LITHOTRIPSY/ STENT PLACEMENT;  Surgeon: Ardis Hughs, MD;  Location: [facility];  Service: Urology;  Laterality: Left;   HERNIA REPAIR Right 2022   inguinal   HOLMIUM LASER APPLICATION Left 65/01/5464   Procedure: HOLMIUM LASER APPLICATION;  Surgeon: Ardis Hughs, MD;  Location: [facility];  Service: Urology;  Laterality: Left;   TOTAL HIP ARTHROPLASTY Right 06/30/2022   Procedure: RIGHT TOTAL HIP ARTHROPLASTY ANTERIOR APPROACH;  Surgeon: Leandrew Koyanagi, MD;  Location: [facility];  Service: Orthopedics;  Laterality: Right;  3-C   Social History:  reports that he quit smoking about 28 years ago. His smoking use included cigarettes. He has a 6.00 pack-year smoking history. He has never used smokeless tobacco. He reports that he does not currently use alcohol. He reports that he does not use drugs.  Allergies  Allergen Reactions   Statins     Jaw tightness and severe muscle pain- Pravastatin     Family History  Problem Relation Age of Onset   Heart disease Mother 59   Alcohol abuse Mother    Depression Mother    Hypertension Father    Sleep apnea Father    Cancer Sister        hodgkins, breast   Colon cancer Neg Hx    Pancreatic cancer Neg Hx    Rectal cancer Neg Hx    Stomach cancer Neg Hx    Esophageal cancer Neg Hx    Inflammatory bowel disease Neg Hx    Liver disease Neg Hx     Prior to Admission medications   Medication Sig Start Date End Date Taking? Authorizing Provider  acetaminophen (TYLENOL) 650 MG CR tablet Take 1,300 mg by mouth in the morning and at bedtime.    [provider]  allopurinol (ZYLOPRIM) 300 MG tablet Take 300 mg by mouth daily. 05/14/20   [provider]  amLODipine (NORVASC) 5 MG tablet Take 1 tablet (5 mg total) by mouth daily. Patient taking differently: Take 5 mg by mouth every evening. 06/06/22   Richardson Dopp T, PA-C  dexamethasone (DECADRON) 2 MG tablet Take 2 tablets (4mg ) for 7 days. Then decrease to 1 (2mg ) tablet for 7 days. Then decrease to 1 (2mg ) tablet every other day. 11/12/22   Pickenpack-Cousar, Carlena Sax, NP  diphenoxylate-atropine (LOMOTIL) 2.5-0.025 MG tablet Take 1 tablet by mouth every 4 (four) hours as needed for diarrhea or loose stools. Take 2 tablets after the first stool, then take as directed. 12/12/22   Pickenpack-Cousar, Carlena Sax, NP  DULoxetine (CYMBALTA) 30 MG capsule Take 1 capsule (30 mg total) by mouth daily. Patient not taking: Reported on 12/10/2022 11/19/22   Pickenpack-Cousar, Carlena Sax, NP  Evolocumab (REPATHA SURECLICK) 681 MG/ML SOAJ Inject 140 mg into the skin every 14 (fourteen) days. 12/02/22   Park Liter, MD  fentaNYL (DURAGESIC) 50 MCG/HR Place 1 patch onto the skin every 3 (three) days. 12/12/22   Pickenpack-Cousar, Carlena Sax, NP  folic acid (FOLVITE) 1 MG tablet Take 1 tablet (1 mg total) by mouth daily. 11/26/22   Curt Bears, MD  gabapentin (NEURONTIN) 300 MG capsule Take 900 mg by mouth at bedtime.  [provider]  HYDROmorphone (DILAUDID) 8 MG tablet Take 1 tablet (8 mg total) by mouth every 4 (four) hours as needed for severe pain. 12/08/22   Pickenpack-Cousar, Carlena Sax, NP  ibuprofen (ADVIL) 200 MG tablet Take 600 mg by mouth every 8 (eight) hours as needed for moderate pain.    [provider]  methocarbamol (ROBAXIN) 750 MG tablet Take 750 mg by mouth every 8 (eight) hours as needed for muscle spasms. 11/11/22   [provider]  minoxidil (LONITEN) 10 MG tablet Take 1-1.5 tablets (10-15 mg total) by mouth 2 (two) times daily. TAKE 1 TABLET IN THE MORNING AND TAKE 1/2 TABLET AT NIGHT Patient taking differently: Take 10 mg by mouth 2 (two) times daily. 09/12/22   Park Liter, MD  ondansetron (ZOFRAN-ODT) 4 MG disintegrating tablet Take 1-2 tablets (4-8 mg total) by mouth every 6 (six) hours as needed for nausea  or vomiting. 11/12/22   Pickenpack-Cousar, Carlena Sax, NP  pantoprazole (PROTONIX) 40 MG tablet Take 1 tablet (40 mg total) by mouth 2 (two) times daily. 11/26/22   Pickenpack-Cousar, Carlena Sax, NP  polyethylene glycol (MIRALAX / GLYCOLAX) 17 g packet Take 17 g by mouth 2 (two) times daily. 11/02/22   Antonieta Pert, MD  PRESCRIPTION MEDICATION Apply 1 Application topically at bedtime as needed (pain). Compounded pain cream ([place])    [provider]  prochlorperazine (COMPAZINE) 10 MG tablet Take 1 tablet (10 mg total) by mouth every 6 (six) hours as needed for nausea or vomiting. 12/03/22   Curt Bears, MD  senna (SENOKOT) 8.6 MG TABS tablet Take 2 tablets (17.2 mg total) by mouth 2 (two) times daily. 11/27/22   Pickenpack-Cousar, Carlena Sax, NP  valsartan (DIOVAN) 320 MG tablet TAKE 1 TABLET BY MOUTH EVERY DAY Patient taking differently: Take 320 mg by mouth daily. 08/20/22   Park Liter, MD    Physical Exam: Vitals:   12/12/22 1230 12/12/22 1245 12/12/22 1300 12/12/22 1422  BP: (!) 112/52 (!) 124/56 (!) 112/55 (!) 111/52  Pulse: (!) 109 (!) 110 (!) 103 (!) 120  Resp: 15 17 14 20   Temp:    98.4 F (36.9 C)  TempSrc:    Oral  SpO2: 96% (!) 79% 91% 92%   Physical Exam Vitals and nursing note reviewed.  Constitutional:      General: He is awake. He is not in acute distress.    Appearance: He is ill-appearing.  HENT:     Head: Normocephalic.     Nose: No rhinorrhea.     Mouth/Throat:     Mouth: Mucous membranes are moist.  Eyes:     General: No scleral icterus.    Pupils: Pupils are equal, round, and reactive to light.  Neck:     Vascular: No JVD.  Cardiovascular:     Rate and Rhythm: Regular rhythm. Tachycardia present.     Heart sounds: S1 normal and S2 normal.  Pulmonary:     Effort: Pulmonary effort is normal.     Breath sounds: Normal breath sounds. No wheezing, rhonchi or rales.  Abdominal:     General: Bowel sounds are normal. There is no  distension.     Palpations: Abdomen is soft.     Tenderness: There is abdominal tenderness in the right upper quadrant. There is right CVA tenderness. There is no left CVA tenderness, guarding or rebound.  Musculoskeletal:     Cervical back: Neck supple.     Right lower leg:  No edema.     Left lower leg: No edema.  Skin:    General: Skin is warm and dry.  Neurological:     General: No focal deficit present.     Mental Status: He is alert and oriented to person, place, and time.  Psychiatric:        Mood and Affect: Mood normal.        Behavior: Behavior normal. Behavior is cooperative.   Data Reviewed:  Results are pending, will review when available.  Assessment and Plan: Principal Problem:   Sepsis due to undetermined organism ([place]) In the setting of severe diarrhea for the past few days. Imaging showing mild sigmoid diverticulitis. He was also neutropenic earlier this week. Admit to telemetry/inpatient. Continue IV fluids. Continue cefepime 2 g every 8 hours.   Continue metronidazole 500 mg IVPB q 12 hr. Continue filgrastim 480 mg once in AM. Follow-up blood culture and sensitivity Follow CBC and CMP in a.m.  Active Problems:   AKI (acute kidney injury) ([place]). Continue IV fluids. Hold ARB/ACE. Avoid hypotension. Avoid nephrotoxins. Monitor intake and output. Monitor renal function electrolytes.    Adenocarcinoma of right lung, stage 4 ([place]) Continue treatment and follow-up as per oncology.    Normocytic anemia Monitor hematocrit and hemoglobin. Transfuse as needed.    Essential hypertension Hold antihypertensives for now. Resume in AM.    Gout of multiple sites Continue allopurinol 300 mg p.o. daily.    Hyperlipidemia Statin intolerant. Follow-up with PCP.    Mild protein malnutrition ([place]) In the setting of cancer/chemotherapy. Consider protein supplementation.    Advance Care Planning:   Code Status: Full Code   Consults:   Family  Communication: His wife was at bedside.  Severity of Illness: The appropriate patient status for this patient is INPATIENT. Inpatient status is judged to be reasonable and necessary in order to provide the required intensity of service to ensure the patient's safety. The patient's presenting symptoms, physical exam findings, and initial radiographic and laboratory data in the context of their chronic comorbidities is felt to place them at high risk for further clinical deterioration. Furthermore, it is not anticipated that the patient will be medically stable for discharge from the hospital within 2 midnights of admission.   * I certify that at the point of admission it is my clinical judgment that the patient will require inpatient hospital care spanning beyond 2 midnights from the point of admission due to high intensity of service, high risk for further deterioration and high frequency of surveillance required.*  Author: Reubin Milan, MD 12/12/2022 3:03 PM  For on call review [email or web address].   This document was prepared using Dragon voice recognition software and may contain some unintended transcription errors.

## 2022-12-12 NOTE — ED Notes (Signed)
Here from [facility]. Report received.

## 2022-12-12 NOTE — ED Notes (Signed)
EDP at [facility]. Pt here from [facility] [name]. Stage 4 Lung CA. Chemo started 1/17. Injection yesterday, and was here for 2nd of 3 injections. Brought to ED for low BP, fever, diarrhea. Fever 101.4. Expected to be neutropenic.  No IV access or port. BP was 103 systolic. SBP usually 150s. BSC in room. Wife at [facility].

## 2022-12-12 NOTE — Sepsis Progress Note (Signed)
Sepsis protocol is being followed by [facility]. 

## 2022-12-13 ENCOUNTER — Encounter: Payer: Self-pay | Admitting: Orthopaedic Surgery

## 2022-12-13 ENCOUNTER — Inpatient Hospital Stay: Payer: Medicare Other

## 2022-12-13 DIAGNOSIS — N179 Acute kidney failure, unspecified: Secondary | ICD-10-CM | POA: Diagnosis not present

## 2022-12-13 DIAGNOSIS — A419 Sepsis, unspecified organism: Secondary | ICD-10-CM | POA: Diagnosis not present

## 2022-12-13 DIAGNOSIS — K5792 Diverticulitis of intestine, part unspecified, without perforation or abscess without bleeding: Secondary | ICD-10-CM | POA: Diagnosis not present

## 2022-12-13 LAB — COMPREHENSIVE METABOLIC PANEL
ALT: 29 U/L (ref 0–44)
AST: 19 U/L (ref 15–41)
Albumin: 2.5 g/dL — ABNORMAL LOW (ref 3.5–5.0)
Alkaline Phosphatase: 56 U/L (ref 38–126)
Anion gap: 9 (ref 5–15)
BUN: 30 mg/dL — ABNORMAL HIGH (ref 8–23)
CO2: 25 mmol/L (ref 22–32)
Calcium: 7.9 mg/dL — ABNORMAL LOW (ref 8.9–10.3)
Chloride: 103 mmol/L (ref 98–111)
Creatinine, Ser: 1.43 mg/dL — ABNORMAL HIGH (ref 0.61–1.24)
GFR, Estimated: 53 mL/min — ABNORMAL LOW (ref >60)
Glucose, Bld: 86 mg/dL (ref 70–99)
Potassium: 3.8 mmol/L (ref 3.5–5.1)
Sodium: 137 mmol/L (ref 135–145)
Total Bilirubin: 0.6 mg/dL (ref 0.3–1.2)
Total Protein: 5.1 g/dL — ABNORMAL LOW (ref 6.5–8.1)

## 2022-12-13 LAB — CBC WITH DIFFERENTIAL/PLATELET
Abs Immature Granulocytes: 0.05 10*3/uL (ref 0.00–0.07)
Basophils Absolute: 0 10*3/uL (ref 0.0–0.1)
Basophils Relative: 1 %
Eosinophils Absolute: 0.1 10*3/uL (ref 0.0–0.5)
Eosinophils Relative: 1 %
HCT: 25.7 % — ABNORMAL LOW (ref 39.0–52.0)
Hemoglobin: 8.4 g/dL — ABNORMAL LOW (ref 13.0–17.0)
Immature Granulocytes: 1 %
Lymphocytes Relative: 7 %
Lymphs Abs: 0.4 10*3/uL — ABNORMAL LOW (ref 0.7–4.0)
MCH: 27.1 pg (ref 26.0–34.0)
MCHC: 32.7 g/dL (ref 30.0–36.0)
MCV: 82.9 fL (ref 80.0–100.0)
Monocytes Absolute: 0.5 10*3/uL (ref 0.1–1.0)
Monocytes Relative: 10 %
Neutro Abs: 4.2 10*3/uL (ref 1.7–7.7)
Neutrophils Relative %: 80 %
Platelets: 109 10*3/uL — ABNORMAL LOW (ref 150–400)
RBC: 3.1 MIL/uL — ABNORMAL LOW (ref 4.22–5.81)
RDW: 15.9 % — ABNORMAL HIGH (ref 11.5–15.5)
WBC: 5.3 10*3/uL (ref 4.0–10.5)
nRBC: 0 % (ref 0.0–0.2)

## 2022-12-13 MED ORDER — HYDROMORPHONE HCL 2 MG PO TABS
8.0000 mg | ORAL_TABLET | ORAL | Status: DC | PRN
  Administered 2022-12-13 – 2022-12-15 (×13): 8 mg via ORAL
  Filled 2022-12-13 (×13): qty 4

## 2022-12-13 MED ORDER — HYDROMORPHONE HCL 2 MG PO TABS: 8.0000 mg | ORAL_TABLET | ORAL | Status: DC

## 2022-12-13 MED ORDER — SODIUM CHLORIDE 0.9 % IV SOLN
2.0000 g | INTRAVENOUS | Status: DC
  Administered 2022-12-13 – 2022-12-14 (×2): 2 g via INTRAVENOUS
  Filled 2022-12-13 (×3): qty 20

## 2022-12-13 MED ORDER — LORATADINE 10 MG PO TABS
10.0000 mg | ORAL_TABLET | Freq: Every day | ORAL | Status: DC
  Administered 2022-12-13: 10 mg via ORAL
  Filled 2022-12-13: qty 1

## 2022-12-13 NOTE — Progress Notes (Signed)
[facility] PROGRESS NOTE    Progress Note  Thomas [name]  [name] DOB: 12/16/1950 DOA: 12/12/2022 PCP: Patient, No Pcp Per     Brief Narrative:   Thomas [name] is an 72 y.o. 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 Earlie [name] was gotten radiation but no chemotherapy, who comes into the ED due to fever hypotension and was found to be septic, chest x-ray was unremarkable except for right upper lobe density, CT of the head showed no acute findings, CT of the chest shows a tumor in caseating the right upper lobe pulmonary artery which is severely narrow no significant progression of known metastatic disease.  CT abdomen and pelvis showed colonic diverticulosis with subtle pericolonic stranding of the proximal colon, probably acute diverticulitis   Assessment/Plan:  Sepsis due to undetermined organism ([place]) due to acute diverticulitis: Started on IV fluids and IV cefepime and Flagyl. Will de-escalate to IV Rocephin. Neutropenia has improved, he was continued for filgrastim. Has defervesced. Along with diet.  Acute kidney injury: The setting of ACE inhibitor and sepsis. Started on IV fluid resuscitation. Creatinine is improving continue IV fluid hydration for additional 24 hours.  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 Resume fentanyl patch, the patient was insisting that he should get his Dilaudid every 4 hour scheduled that that is how he takes it at home, he even has an alarm that goes off of when he should take the oral Dilaudid, and I have asked them repeatedly and he says he always wakes up in the middle of the night to take his oral Dilaudid.  Normocytic anemia: Transfuse  if hemoglobin less than 7 or symptomatic.  Essential hypertension: Continue to hold antihypertensive medication.  Gout of multiple sites: Continue allopurinol.  Hyperlipidemia: Noted.  Mild protein caloric malnutrition: In the setting of cancer continue, protein supplementation. DVT prophylaxis: lovenox Family Communication:Wife Status is: Inpatient Remains inpatient appropriate because: sepsis due to acute diverticulitis    Code Status:     Code Status Orders  (From admission, onward)           Start     Ordered   12/12/22 1459  Full code  Continuous       Question:  By:  Answer:  Consent: discussion documented in EHR   12/12/22 1500           Code Status History     Date Active Date Inactive Code Status Order ID Comments User Context   10/27/2022 2236 11/02/2022 1834 Full Code [id number]  Thomas Boll, MD Inpatient   06/30/2022 1107 07/01/2022 1612 Full Code [id number]  Thomas Koyanagi, MD Inpatient   03/22/2020 2039 03/23/2020 2037 Full Code [id number]  Thomas Jansky, MD Inpatient         IV Access:   Peripheral IV   Procedures and diagnostic studies:   CT Angio Chest PE W and/or Wo Contrast  Result Date: 12/12/2022 CLINICAL DATA:  Pulmonary embolism (PE) suspected, high prob; RLQ abdominal pain n/v/d, abd pain RLQ, lung ca. History of lung cancer on chemotherapy EXAM: CT ANGIOGRAPHY CHEST CT ABDOMEN AND PELVIS WITH CONTRAST TECHNIQUE: Multidetector CT imaging of the chest was performed using the standard protocol during bolus administration of intravenous contrast. Multiplanar CT image reconstructions  and MIPs were obtained to evaluate the vascular anatomy. Multidetector CT imaging of the abdomen and pelvis was performed using the standard protocol during bolus administration of intravenous contrast. RADIATION DOSE REDUCTION: This exam was performed according to the departmental dose-optimization program which includes automated exposure control,  adjustment of the mA and/or kV according to patient size and/or use of iterative reconstruction technique. CONTRAST:  84mL OMNIPAQUE IOHEXOL 350 MG/ML SOLN COMPARISON:  CT 10/28/2022, PET-CT 11/20/2022 FINDINGS: CTA CHEST FINDINGS Cardiovascular: Suboptimal evaluation of the pulmonary arteries secondary to contrast bolus timing, motion artifact, as well as beam hardening artifact secondary to patient's arms positioned against the body. Within this limitation, there is no evidence of a large or central pulmonary embolism. There is tumor encasement of the right upper lobe pulmonary artery which is severely narrowed (series 8, image 79). Heart size is within normal limits. No pericardial effusion. Thoracic aorta is nonaneurysmal. Atherosclerotic calcifications of the aorta and coronary arteries. Mediastinum/Nodes: Metastatic right paratracheal and right hilar lymphadenopathy, not appreciably changed in size from the recent PET-CT. No axillary or left hilar lymphadenopathy. Thyroid, trachea, and esophagus within normal limits. Lungs/Pleura: 1.6 cm pleural based pulmonary nodule in the medial right upper lobe, unchanged (series 7, image 55). Small right pleural effusion with areas of pleural metastatic disease at the lung bases. Left lung remains clear. No pneumothorax. Musculoskeletal: Erosive changes of the posterior right ninth rib, similar. No pathologic fracture. No new lytic or sclerotic bone lesions are identified. Review of the MIP images confirms the above findings. CT ABDOMEN and PELVIS FINDINGS Hepatobiliary: No new liver abnormality or evidence of hepatic metastatic disease. Moderately dilated gallbladder without hyperdense gallstone or pericholecystic inflammatory changes. No biliary dilatation. Pancreas: Unremarkable. No pancreatic ductal dilatation or surrounding inflammatory changes. Spleen: Normal in size without focal abnormality. Adrenals/Urinary Tract: No adrenal nodule. No solid renal mass. No stone  or hydronephrosis. Urinary bladder is obscured by metal artifact from patient's hip prosthesis. Stomach/Bowel: Stomach within normal limits. No abnormally dilated loops of bowel. Colonic diverticulosis with subtle pericolonic stranding of the proximal sigmoid colon (series 3, image 79). No pericolonic fluid collection or extraluminal air. Vascular/Lymphatic: Aortic atherosclerosis. Nonenlarged celiac chain lymph nodes, noted to be hypermetabolic on PET-CT (series 3, image 32). No new or enlarging abdominopelvic lymph nodes. Reproductive: Prostate largely obscured by streak artifact. Other: No free fluid. No abdominopelvic fluid collection. No pneumoperitoneum. Tiny fat containing umbilical hernia. Musculoskeletal: Destructive mass of the posterior right iliac bone with extraosseous soft tissue component (series 3, image 65), not significantly changed. No new bone lesion. Review of the MIP images confirms the above findings. IMPRESSION: 1. Suboptimal evaluation of the pulmonary arteries. See above discussion. Within this limitation, there is no evidence of a large or central pulmonary embolism. There is tumor encasement of the right upper lobe pulmonary artery which is severely narrowed. 2. No significant interval progression of known metastatic lung cancer including right upper lobe lung lesion, mediastinal and right hilar lymphadenopathy, retroperitoneal lymph node involvement, as well as osseous metastatic involvement of the right ninth rib and right iliac bone. 3. Colonic diverticulosis with subtle pericolonic stranding of the proximal sigmoid colon, suggestive of mild acute uncomplicated diverticulitis. 4. Aortic atherosclerosis (ICD10-I70.0). Electronically Signed   By: Davina Poke D.O.   On: 12/12/2022 14:13   CT ABDOMEN PELVIS W CONTRAST  Result Date: 12/12/2022 CLINICAL DATA:  Pulmonary embolism (PE) suspected, high prob; RLQ abdominal pain n/v/d, abd pain RLQ, lung ca. History of lung cancer on  chemotherapy  EXAM: CT ANGIOGRAPHY CHEST CT ABDOMEN AND PELVIS WITH CONTRAST TECHNIQUE: Multidetector CT imaging of the chest was performed using the standard protocol during bolus administration of intravenous contrast. Multiplanar CT image reconstructions and MIPs were obtained to evaluate the vascular anatomy. Multidetector CT imaging of the abdomen and pelvis was performed using the standard protocol during bolus administration of intravenous contrast. RADIATION DOSE REDUCTION: This exam was performed according to the departmental dose-optimization program which includes automated exposure control, adjustment of the mA and/or kV according to patient size and/or use of iterative reconstruction technique. CONTRAST:  51mL OMNIPAQUE IOHEXOL 350 MG/ML SOLN COMPARISON:  CT 10/28/2022, PET-CT 11/20/2022 FINDINGS: CTA CHEST FINDINGS Cardiovascular: Suboptimal evaluation of the pulmonary arteries secondary to contrast bolus timing, motion artifact, as well as beam hardening artifact secondary to patient's arms positioned against the body. Within this limitation, there is no evidence of a large or central pulmonary embolism. There is tumor encasement of the right upper lobe pulmonary artery which is severely narrowed (series 8, image 79). Heart size is within normal limits. No pericardial effusion. Thoracic aorta is nonaneurysmal. Atherosclerotic calcifications of the aorta and coronary arteries. Mediastinum/Nodes: Metastatic right paratracheal and right hilar lymphadenopathy, not appreciably changed in size from the recent PET-CT. No axillary or left hilar lymphadenopathy. Thyroid, trachea, and esophagus within normal limits. Lungs/Pleura: 1.6 cm pleural based pulmonary nodule in the medial right upper lobe, unchanged (series 7, image 55). Small right pleural effusion with areas of pleural metastatic disease at the lung bases. Left lung remains clear. No pneumothorax. Musculoskeletal: Erosive changes of the posterior  right ninth rib, similar. No pathologic fracture. No new lytic or sclerotic bone lesions are identified. Review of the MIP images confirms the above findings. CT ABDOMEN and PELVIS FINDINGS Hepatobiliary: No new liver abnormality or evidence of hepatic metastatic disease. Moderately dilated gallbladder without hyperdense gallstone or pericholecystic inflammatory changes. No biliary dilatation. Pancreas: Unremarkable. No pancreatic ductal dilatation or surrounding inflammatory changes. Spleen: Normal in size without focal abnormality. Adrenals/Urinary Tract: No adrenal nodule. No solid renal mass. No stone or hydronephrosis. Urinary bladder is obscured by metal artifact from patient's hip prosthesis. Stomach/Bowel: Stomach within normal limits. No abnormally dilated loops of bowel. Colonic diverticulosis with subtle pericolonic stranding of the proximal sigmoid colon (series 3, image 79). No pericolonic fluid collection or extraluminal air. Vascular/Lymphatic: Aortic atherosclerosis. Nonenlarged celiac chain lymph nodes, noted to be hypermetabolic on PET-CT (series 3, image 32). No new or enlarging abdominopelvic lymph nodes. Reproductive: Prostate largely obscured by streak artifact. Other: No free fluid. No abdominopelvic fluid collection. No pneumoperitoneum. Tiny fat containing umbilical hernia. Musculoskeletal: Destructive mass of the posterior right iliac bone with extraosseous soft tissue component (series 3, image 65), not significantly changed. No new bone lesion. Review of the MIP images confirms the above findings. IMPRESSION: 1. Suboptimal evaluation of the pulmonary arteries. See above discussion. Within this limitation, there is no evidence of a large or central pulmonary embolism. There is tumor encasement of the right upper lobe pulmonary artery which is severely narrowed. 2. No significant interval progression of known metastatic lung cancer including right upper lobe lung lesion, mediastinal and  right hilar lymphadenopathy, retroperitoneal lymph node involvement, as well as osseous metastatic involvement of the right ninth rib and right iliac bone. 3. Colonic diverticulosis with subtle pericolonic stranding of the proximal sigmoid colon, suggestive of mild acute uncomplicated diverticulitis. 4. Aortic atherosclerosis (ICD10-I70.0). Electronically Signed   By: Davina Poke D.O.   On: 12/12/2022 14:13   CT  Head Wo Contrast  Result Date: 12/12/2022 CLINICAL DATA:  Headache. EXAM: CT HEAD WITHOUT CONTRAST TECHNIQUE: Contiguous axial images were obtained from the base of the skull through the vertex without intravenous contrast. RADIATION DOSE REDUCTION: This exam was performed according to the departmental dose-optimization program which includes automated exposure control, adjustment of the mA and/or kV according to patient size and/or use of iterative reconstruction technique. COMPARISON:  MRI head 10/29/2022. FINDINGS: Brain: No evidence of acute infarction, hemorrhage, hydrocephalus, extra-axial collection or mass lesion/mass effect. Patchy white matter hypodensities, nonspecific but compatible with chronic microvascular ischemic disease. Vascular: No hyperdense vessel identified. Skull: No acute fracture. Sinuses/Orbits: Largely clear sinuses.  No acute orbital findings. Other: No mastoid effusions. IMPRESSION: No evidence of acute intracranial abnormality. Electronically Signed   By: Margaretha Sheffield M.D.   On: 12/12/2022 14:02   DG Chest Port 1 View  Result Date: 12/12/2022 CLINICAL DATA:  Fever, hypotension and diarrhea. Started chemotherapy yesterday for stage IV lung cancer. EXAM: PORTABLE CHEST 1 VIEW COMPARISON:  03/22/2020.  Chest CTA dated 11/08/2022 FINDINGS: Normal sized heart. Mildly tortuous and mildly calcified thoracic aorta. Ill-defined increased density at the medial aspect of the right upper lobe compatible with the patient's known malignancy and associated metastatic  adenopathy seen on the chest CTA. Additional small nodular density in the lateral aspect of the right upper lung zone with no corresponding abnormality on the previous CT. Possible small nodular density in the lateral aspect of the left upper lung zone in area of overlapping rib and scapula, not previously seen. Otherwise, clear lungs. No pleural fluid. Thoracic spine degenerative changes. IMPRESSION: 1. No acute abnormality. 2. Ill-defined increased density at the medial aspect of the right upper lobe compatible with the patient's known malignancy and associated metastatic adenopathy seen on the previous chest CTA. 3. Possible interval small nodule in the lateral aspect of each upper lung zone, potentially representing small metastases. Electronically Signed   By: Claudie Revering M.D.   On: 12/12/2022 12:14     Medical Consultants:   None.   Subjective:    Thomas [name] laying comfortably in bed.  Objective:    Vitals:   12/12/22 1836 12/12/22 2049 12/13/22 0109 12/13/22 0542  BP:  (!) 129/53 (!) 127/52 (!) 130/57  Pulse:  (!) 104 (!) 109 94  Resp:  19 17 17   Temp:  99.5 F (37.5 C) 99.8 F (37.7 C) 98.7 F (37.1 C)  TempSrc:  Oral Oral Oral  SpO2:  91% 91% 94%  Weight: 90.5 kg     Height: 5\' 11"  (1.803 m)      SpO2: 94 %   Intake/Output Summary (Last 24 hours) at 12/13/2022 0701 Last data filed at 12/13/2022 0300 Gross per 24 hour  Intake 6307.5 ml  Output --  Net 6307.5 ml   Filed Weights   12/12/22 1836  Weight: 90.5 kg    Exam: General exam: In no acute distress. Respiratory system: Good air movement and clear to auscultation. Cardiovascular system: S1 & S2 heard, RRR. No JVD. Gastrointestinal system: Abdomen is nondistended, soft and nontender.  Extremities: No pedal edema. Skin: No rashes, lesions or ulcers Psychiatry: Judgement and insight appear normal. Mood & affect appropriate.    Data Reviewed:    Labs: Basic Metabolic Panel: Recent Labs  Lab  12/10/22 1022 12/12/22 1220 12/13/22 0610  NA 136 135 137  K 4.1 3.7 3.8  CL 99 98 103  CO2 32 24 25  GLUCOSE 107* 95 86  BUN 19 40* 30*  CREATININE 1.24 2.23* 1.43*  CALCIUM 8.6* 8.4* 7.9*   GFR Estimated Creatinine Clearance: 56.1 mL/min (A) (by C-G formula based on SCr of 1.43 mg/dL (H)). Liver Function Tests: Recent Labs  Lab 12/10/22 1022 12/12/22 1220 12/13/22 0610  AST 30 26 19   ALT 55* 43 29  ALKPHOS 83 74 56  BILITOT 0.5 0.8 0.6  PROT 6.6 6.6 5.1*  ALBUMIN 3.5 3.3* 2.5*   No results for input(s): "LIPASE", "AMYLASE" in the last 168 hours. No results for input(s): "AMMONIA" in the last 168 hours. Coagulation profile Recent Labs  Lab 12/12/22 1220  INR 1.2   COVID-19 Labs  No results for input(s): "DDIMER", "FERRITIN", "LDH", "CRP" in the last 72 hours.  Lab Results  Component Value Date   SARSCOV2NAA NEGATIVE 12/12/2022   [place] NEGATIVE 03/22/2020    CBC: Recent Labs  Lab 12/10/22 1022 12/12/22 1220 12/13/22 0610  WBC 1.0* 4.4 5.3  NEUTROABS 0.6* 3.5 PENDING  HGB 11.3* 10.8* 8.4*  HCT 34.9* 34.0* 25.7*  MCV 81.5 82.1 82.9  PLT 172 151 109*   Cardiac Enzymes: No results for input(s): "CKTOTAL", "CKMB", "CKMBINDEX", "TROPONINI" in the last 168 hours. BNP (last 3 results) No results for input(s): "PROBNP" in the last 8760 hours. CBG: No results for input(s): "GLUCAP" in the last 168 hours. D-Dimer: No results for input(s): "DDIMER" in the last 72 hours. Hgb A1c: No results for input(s): "HGBA1C" in the last 72 hours. Lipid Profile: No results for input(s): "CHOL", "HDL", "LDLCALC", "TRIG", "CHOLHDL", "LDLDIRECT" in the last 72 hours. Thyroid function studies: No results for input(s): "TSH", "T4TOTAL", "T3FREE", "THYROIDAB" in the last 72 hours.  Invalid input(s): "FREET3" Anemia work up: No results for input(s): "VITAMINB12", "FOLATE", "FERRITIN", "TIBC", "IRON", "RETICCTPCT" in the last 72 hours. Sepsis Labs: Recent Labs  Lab  12/10/22 1022 12/12/22 1220 12/12/22 1532 12/12/22 1819 12/13/22 0610  WBC 1.0* 4.4  --   --  5.3  LATICACIDVEN  --  1.0 4.8* 1.4  --    Microbiology Recent Results (from the past 240 hour(s))  Resp panel by RT-PCR (RSV, Flu A&B, Covid) Anterior Nasal Swab     Status: None   Collection Time: 12/12/22 11:40 AM   Specimen: Anterior Nasal Swab  Result Value Ref Range Status   SARS Coronavirus 2 by RT PCR NEGATIVE NEGATIVE Final    Comment: (NOTE) SARS-CoV-2 target nucleic acids are NOT DETECTED.  The SARS-CoV-2 RNA is generally detectable in upper respiratory specimens during the acute phase of infection. The lowest concentration of SARS-CoV-2 viral copies this assay can detect is 138 copies/mL. A negative result does not preclude SARS-Cov-2 infection and should not be used as the sole basis for treatment or other patient management decisions. A negative result may occur with  improper specimen collection/handling, submission of specimen other than nasopharyngeal swab, presence of viral mutation(s) within the areas targeted by this assay, and inadequate number of viral copies(<138 copies/mL). A negative result must be combined with clinical observations, patient history, and epidemiological information. The expected result is Negative.  Fact Sheet for Patients:  EntrepreneurPulse.com.au  Fact Sheet for Healthcare Providers:  IncredibleEmployment.be  This test is no t yet approved or cleared by the [place] FDA and  has been authorized for detection and/or diagnosis of SARS-CoV-2 by FDA under an Emergency Use Authorization (EUA). This EUA will remain  in effect (meaning this test can be used) for the duration of the COVID-19 declaration under Section 564(b)(1) of  the Act, 21 U.S.C.section 360bbb-3(b)(1), unless the authorization is terminated  or revoked sooner.       Influenza A by PCR NEGATIVE NEGATIVE Final   Influenza B by PCR  NEGATIVE NEGATIVE Final    Comment: (NOTE) The Xpert Xpress SARS-CoV-2/FLU/RSV plus assay is intended as an aid in the diagnosis of influenza from Nasopharyngeal swab specimens and should not be used as a sole basis for treatment. Nasal washings and aspirates are unacceptable for Xpert Xpress SARS-CoV-2/FLU/RSV testing.  Fact Sheet for Patients: EntrepreneurPulse.com.au  Fact Sheet for Healthcare Providers: IncredibleEmployment.be  This test is not yet approved or cleared by the [place] FDA and has been authorized for detection and/or diagnosis of SARS-CoV-2 by FDA under an Emergency Use Authorization (EUA). This EUA will remain in effect (meaning this test can be used) for the duration of the COVID-19 declaration under Section 564(b)(1) of the Act, 21 U.S.C. section 360bbb-3(b)(1), unless the authorization is terminated or revoked.     Resp Syncytial Virus by PCR NEGATIVE NEGATIVE Final    Comment: (NOTE) Fact Sheet for Patients: EntrepreneurPulse.com.au  Fact Sheet for Healthcare Providers: IncredibleEmployment.be  This test is not yet approved or cleared by the [place] FDA and has been authorized for detection and/or diagnosis of SARS-CoV-2 by FDA under an Emergency Use Authorization (EUA). This EUA will remain in effect (meaning this test can be used) for the duration of the COVID-19 declaration under Section 564(b)(1) of the Act, 21 U.S.C. section 360bbb-3(b)(1), unless the authorization is terminated or revoked.  Performed at [facility], [place] [address] [place], [place] [address]   Blood Culture (routine x 2)     Status: None (Preliminary result)   Collection Time: 12/12/22 12:20 PM   Specimen: Right Antecubital; Blood  Result Value Ref Range Status   Specimen Description   Final    RIGHT ANTECUBITAL Performed at [facility] [address], [place], [place] [address]    Special Requests   Final    BOTTLES DRAWN AEROBIC AND ANAEROBIC Blood Culture adequate volume Performed at [facility] [address] [place], [place] [address]    Culture   Final    NO GROWTH < 24 HOURS Performed at [facility] [address] [place], [place] [address]    Report Status PENDING  Incomplete  Blood Culture (routine x 2)     Status: None (Preliminary result)   Collection Time: 12/12/22 12:20 PM   Specimen: BLOOD RIGHT FOREARM  Result Value Ref Range Status   Specimen Description   Final    BLOOD RIGHT FOREARM Performed at [facility] [address], [place], [place] [address]    Special Requests   Final    BOTTLES DRAWN AEROBIC AND ANAEROBIC Blood Culture adequate volume Performed at [facility] [address] [place], [place] [address]    Culture   Final    NO GROWTH < 24 HOURS Performed at [facility] [address], [place], [place] [address]    Report Status PENDING  Incomplete     Medications:    allopurinol  300 mg Oral Daily   fentaNYL  1 patch Transdermal Q72H   gabapentin  900 mg Oral QHS   pantoprazole  40 mg Oral BID   Tbo-Filgrastim  480 mcg Subcutaneous Once   Continuous Infusions:  ceFEPime (MAXIPIME) IV 2 g (12/12/22 2057)   lactated ringers 150 mL/hr at 12/13/22 0418  metronidazole 500 mg (12/12/22 2059)      LOS: 1 day   Charlynne Cousins  [facility]  12/13/2022, 7:01 AM

## 2022-12-14 ENCOUNTER — Encounter: Payer: Self-pay | Admitting: Internal Medicine

## 2022-12-14 DIAGNOSIS — N179 Acute kidney failure, unspecified: Secondary | ICD-10-CM | POA: Diagnosis not present

## 2022-12-14 DIAGNOSIS — A419 Sepsis, unspecified organism: Secondary | ICD-10-CM | POA: Diagnosis not present

## 2022-12-14 DIAGNOSIS — I10 Essential (primary) hypertension: Secondary | ICD-10-CM

## 2022-12-14 DIAGNOSIS — K5792 Diverticulitis of intestine, part unspecified, without perforation or abscess without bleeding: Secondary | ICD-10-CM | POA: Diagnosis not present

## 2022-12-14 LAB — CBC WITH DIFFERENTIAL/PLATELET
Abs Immature Granulocytes: 0.67 10*3/uL — ABNORMAL HIGH (ref 0.00–0.07)
Basophils Absolute: 0.1 10*3/uL (ref 0.0–0.1)
Basophils Relative: 1 %
Eosinophils Absolute: 0.1 10*3/uL (ref 0.0–0.5)
Eosinophils Relative: 1 %
HCT: 32.2 % — ABNORMAL LOW (ref 39.0–52.0)
Hemoglobin: 10.1 g/dL — ABNORMAL LOW (ref 13.0–17.0)
Immature Granulocytes: 7 %
Lymphocytes Relative: 6 %
Lymphs Abs: 0.6 10*3/uL — ABNORMAL LOW (ref 0.7–4.0)
MCH: 26.3 pg (ref 26.0–34.0)
MCHC: 31.4 g/dL (ref 30.0–36.0)
MCV: 83.9 fL (ref 80.0–100.0)
Monocytes Absolute: 0.8 10*3/uL (ref 0.1–1.0)
Monocytes Relative: 9 %
Neutro Abs: 7.2 10*3/uL (ref 1.7–7.7)
Neutrophils Relative %: 76 %
Platelets: 133 10*3/uL — ABNORMAL LOW (ref 150–400)
RBC: 3.84 MIL/uL — ABNORMAL LOW (ref 4.22–5.81)
RDW: 15.9 % — ABNORMAL HIGH (ref 11.5–15.5)
WBC: 9.4 10*3/uL (ref 4.0–10.5)
nRBC: 0 % (ref 0.0–0.2)

## 2022-12-14 LAB — COMPREHENSIVE METABOLIC PANEL
ALT: 29 U/L (ref 0–44)
AST: 24 U/L (ref 15–41)
Albumin: 3 g/dL — ABNORMAL LOW (ref 3.5–5.0)
Alkaline Phosphatase: 73 U/L (ref 38–126)
Anion gap: 11 (ref 5–15)
BUN: 14 mg/dL (ref 8–23)
CO2: 27 mmol/L (ref 22–32)
Calcium: 8.3 mg/dL — ABNORMAL LOW (ref 8.9–10.3)
Chloride: 101 mmol/L (ref 98–111)
Creatinine, Ser: 1.13 mg/dL (ref 0.61–1.24)
GFR, Estimated: >60 mL/min (ref >60)
Glucose, Bld: 91 mg/dL (ref 70–99)
Potassium: 3.7 mmol/L (ref 3.5–5.1)
Sodium: 139 mmol/L (ref 135–145)
Total Bilirubin: 0.7 mg/dL (ref 0.3–1.2)
Total Protein: 6 g/dL — ABNORMAL LOW (ref 6.5–8.1)

## 2022-12-14 MED ORDER — AMLODIPINE BESYLATE 5 MG PO TABS
5.0000 mg | ORAL_TABLET | Freq: Every day | ORAL | Status: DC
  Administered 2022-12-14 – 2022-12-15 (×2): 5 mg via ORAL
  Filled 2022-12-14 (×2): qty 1

## 2022-12-14 MED ORDER — HYDROMORPHONE HCL 1 MG/ML IJ SOLN: 1.0000 mg | INTRAMUSCULAR | Status: DC | PRN

## 2022-12-14 MED ORDER — LORATADINE 10 MG PO TABS
10.0000 mg | ORAL_TABLET | Freq: Every day | ORAL | Status: DC
  Administered 2022-12-14 – 2022-12-15 (×2): 10 mg via ORAL
  Filled 2022-12-14 (×2): qty 1

## 2022-12-14 NOTE — Progress Notes (Signed)
[facility] PROGRESS NOTE    Progress Note  [name]  QJJ:[id number] DOB: August 02, 1951 DOA: 12/12/2022 PCP: Patient, No Pcp Per     Brief Narrative:   Thomas [name] is an 72 y.o. 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 Earlie Server was gotten radiation but no chemotherapy, who comes into the ED due to fever hypotension and was found to be septic, chest x-ray was unremarkable except for right upper lobe density, CT of the head showed no acute findings, CT of the chest shows a tumor in caseating the right upper lobe pulmonary artery which is severely narrow no significant progression of known metastatic disease.  CT abdomen and pelvis showed colonic diverticulosis with subtle pericolonic stranding of the proximal colon, probably acute diverticulitis   Assessment/Plan:  Sepsis due to undetermined organism ([place]) due to acute diverticulitis: Continue IV Rocephin and Flagyl. Neutropenia has improved, he was continued for filgrastim. Has defervesced. Tolerating his diet.  Acute kidney injury: The setting of ACE inhibitor and sepsis. He has been fluid sedated creatinine has returned to baseline.  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 Continue fentanyl and hydromorphone.  Normocytic anemia: Transfuse if hemoglobin less than 7 or symptomatic.  Essential hypertension: Pressure is rising slowly we will start resuming antihypertensive medication.  Gout of multiple sites: Continue allopurinol.  Hyperlipidemia: Noted.  Mild protein caloric malnutrition: In the setting of cancer continue, protein supplementation.  DVT prophylaxis: lovenox Family Communication:Wife Status is:  Inpatient Remains inpatient appropriate because: sepsis due to acute diverticulitis    Code Status:     Code Status Orders  (From admission, onward)           Start     Ordered   12/12/22 1459  Full code  Continuous       Question:  By:  Answer:  Consent: discussion documented in EHR   12/12/22 1500           Code Status History     Date Active Date Inactive Code Status Order ID Comments User Context   10/27/2022 2236 11/02/2022 1834 Full Code [id number]  Clance Boll, MD Inpatient   06/30/2022 1107 07/01/2022 1612 Full Code [id number]  Leandrew Koyanagi, MD Inpatient   03/22/2020 2039 03/23/2020 2037 Full Code [id number]  Modena Jansky, MD Inpatient         IV Access:   Peripheral IV   Procedures and diagnostic studies:   CT Angio Chest PE W and/or Wo Contrast  Result Date: 12/12/2022 CLINICAL DATA:  Pulmonary embolism (PE) suspected, high prob; RLQ abdominal pain n/v/d, abd pain RLQ, lung ca. History of lung cancer on chemotherapy EXAM: CT ANGIOGRAPHY CHEST CT ABDOMEN AND PELVIS WITH CONTRAST TECHNIQUE: Multidetector CT imaging of the chest was performed using the standard protocol during bolus administration of intravenous contrast. Multiplanar CT image reconstructions and MIPs were obtained to evaluate the vascular anatomy. Multidetector CT imaging of the abdomen and pelvis was performed using the standard protocol during bolus administration of intravenous contrast. RADIATION DOSE REDUCTION: This exam was performed according to the departmental dose-optimization program which includes automated exposure control, adjustment of the mA and/or kV according to patient size and/or use of iterative reconstruction technique. CONTRAST:  87mL OMNIPAQUE IOHEXOL 350 MG/ML SOLN  COMPARISON:  CT 10/28/2022, PET-CT 11/20/2022 FINDINGS: CTA CHEST FINDINGS Cardiovascular: Suboptimal evaluation of the pulmonary arteries secondary to contrast bolus timing, motion artifact, as well as  beam hardening artifact secondary to patient's arms positioned against the body. Within this limitation, there is no evidence of a large or central pulmonary embolism. There is tumor encasement of the right upper lobe pulmonary artery which is severely narrowed (series 8, image 79). Heart size is within normal limits. No pericardial effusion. Thoracic aorta is nonaneurysmal. Atherosclerotic calcifications of the aorta and coronary arteries. Mediastinum/Nodes: Metastatic right paratracheal and right hilar lymphadenopathy, not appreciably changed in size from the recent PET-CT. No axillary or left hilar lymphadenopathy. Thyroid, trachea, and esophagus within normal limits. Lungs/Pleura: 1.6 cm pleural based pulmonary nodule in the medial right upper lobe, unchanged (series 7, image 55). Small right pleural effusion with areas of pleural metastatic disease at the lung bases. Left lung remains clear. No pneumothorax. Musculoskeletal: Erosive changes of the posterior right ninth rib, similar. No pathologic fracture. No new lytic or sclerotic bone lesions are identified. Review of the MIP images confirms the above findings. CT ABDOMEN and PELVIS FINDINGS Hepatobiliary: No new liver abnormality or evidence of hepatic metastatic disease. Moderately dilated gallbladder without hyperdense gallstone or pericholecystic inflammatory changes. No biliary dilatation. Pancreas: Unremarkable. No pancreatic ductal dilatation or surrounding inflammatory changes. Spleen: Normal in size without focal abnormality. Adrenals/Urinary Tract: No adrenal nodule. No solid renal mass. No stone or hydronephrosis. Urinary bladder is obscured by metal artifact from patient's hip prosthesis. Stomach/Bowel: Stomach within normal limits. No abnormally dilated loops of bowel. Colonic diverticulosis with subtle pericolonic stranding of the proximal sigmoid colon (series 3, image 79). No pericolonic fluid collection or extraluminal air.  Vascular/Lymphatic: Aortic atherosclerosis. Nonenlarged celiac chain lymph nodes, noted to be hypermetabolic on PET-CT (series 3, image 32). No new or enlarging abdominopelvic lymph nodes. Reproductive: Prostate largely obscured by streak artifact. Other: No free fluid. No abdominopelvic fluid collection. No pneumoperitoneum. Tiny fat containing umbilical hernia. Musculoskeletal: Destructive mass of the posterior right iliac bone with extraosseous soft tissue component (series 3, image 65), not significantly changed. No new bone lesion. Review of the MIP images confirms the above findings. IMPRESSION: 1. Suboptimal evaluation of the pulmonary arteries. See above discussion. Within this limitation, there is no evidence of a large or central pulmonary embolism. There is tumor encasement of the right upper lobe pulmonary artery which is severely narrowed. 2. No significant interval progression of known metastatic lung cancer including right upper lobe lung lesion, mediastinal and right hilar lymphadenopathy, retroperitoneal lymph node involvement, as well as osseous metastatic involvement of the right ninth rib and right iliac bone. 3. Colonic diverticulosis with subtle pericolonic stranding of the proximal sigmoid colon, suggestive of mild acute uncomplicated diverticulitis. 4. Aortic atherosclerosis (ICD10-I70.0). Electronically Signed   By: Davina Poke D.O.   On: 12/12/2022 14:13   CT ABDOMEN PELVIS W CONTRAST  Result Date: 12/12/2022 CLINICAL DATA:  Pulmonary embolism (PE) suspected, high prob; RLQ abdominal pain n/v/d, abd pain RLQ, lung ca. History of lung cancer on chemotherapy EXAM: CT ANGIOGRAPHY CHEST CT ABDOMEN AND PELVIS WITH CONTRAST TECHNIQUE: Multidetector CT imaging of the chest was performed using the standard protocol during bolus administration of intravenous contrast. Multiplanar CT image reconstructions and MIPs were obtained to evaluate the vascular anatomy. Multidetector CT imaging of  the abdomen and pelvis was performed using the standard protocol during bolus administration of intravenous contrast. RADIATION DOSE REDUCTION: This exam was performed according to  the departmental dose-optimization program which includes automated exposure control, adjustment of the mA and/or kV according to patient size and/or use of iterative reconstruction technique. CONTRAST:  62mL OMNIPAQUE IOHEXOL 350 MG/ML SOLN COMPARISON:  CT 10/28/2022, PET-CT 11/20/2022 FINDINGS: CTA CHEST FINDINGS Cardiovascular: Suboptimal evaluation of the pulmonary arteries secondary to contrast bolus timing, motion artifact, as well as beam hardening artifact secondary to patient's arms positioned against the body. Within this limitation, there is no evidence of a large or central pulmonary embolism. There is tumor encasement of the right upper lobe pulmonary artery which is severely narrowed (series 8, image 79). Heart size is within normal limits. No pericardial effusion. Thoracic aorta is nonaneurysmal. Atherosclerotic calcifications of the aorta and coronary arteries. Mediastinum/Nodes: Metastatic right paratracheal and right hilar lymphadenopathy, not appreciably changed in size from the recent PET-CT. No axillary or left hilar lymphadenopathy. Thyroid, trachea, and esophagus within normal limits. Lungs/Pleura: 1.6 cm pleural based pulmonary nodule in the medial right upper lobe, unchanged (series 7, image 55). Small right pleural effusion with areas of pleural metastatic disease at the lung bases. Left lung remains clear. No pneumothorax. Musculoskeletal: Erosive changes of the posterior right ninth rib, similar. No pathologic fracture. No new lytic or sclerotic bone lesions are identified. Review of the MIP images confirms the above findings. CT ABDOMEN and PELVIS FINDINGS Hepatobiliary: No new liver abnormality or evidence of hepatic metastatic disease. Moderately dilated gallbladder without hyperdense gallstone or  pericholecystic inflammatory changes. No biliary dilatation. Pancreas: Unremarkable. No pancreatic ductal dilatation or surrounding inflammatory changes. Spleen: Normal in size without focal abnormality. Adrenals/Urinary Tract: No adrenal nodule. No solid renal mass. No stone or hydronephrosis. Urinary bladder is obscured by metal artifact from patient's hip prosthesis. Stomach/Bowel: Stomach within normal limits. No abnormally dilated loops of bowel. Colonic diverticulosis with subtle pericolonic stranding of the proximal sigmoid colon (series 3, image 79). No pericolonic fluid collection or extraluminal air. Vascular/Lymphatic: Aortic atherosclerosis. Nonenlarged celiac chain lymph nodes, noted to be hypermetabolic on PET-CT (series 3, image 32). No new or enlarging abdominopelvic lymph nodes. Reproductive: Prostate largely obscured by streak artifact. Other: No free fluid. No abdominopelvic fluid collection. No pneumoperitoneum. Tiny fat containing umbilical hernia. Musculoskeletal: Destructive mass of the posterior right iliac bone with extraosseous soft tissue component (series 3, image 65), not significantly changed. No new bone lesion. Review of the MIP images confirms the above findings. IMPRESSION: 1. Suboptimal evaluation of the pulmonary arteries. See above discussion. Within this limitation, there is no evidence of a large or central pulmonary embolism. There is tumor encasement of the right upper lobe pulmonary artery which is severely narrowed. 2. No significant interval progression of known metastatic lung cancer including right upper lobe lung lesion, mediastinal and right hilar lymphadenopathy, retroperitoneal lymph node involvement, as well as osseous metastatic involvement of the right ninth rib and right iliac bone. 3. Colonic diverticulosis with subtle pericolonic stranding of the proximal sigmoid colon, suggestive of mild acute uncomplicated diverticulitis. 4. Aortic atherosclerosis  (ICD10-I70.0). Electronically Signed   By: Davina Poke D.O.   On: 12/12/2022 14:13   CT Head Wo Contrast  Result Date: 12/12/2022 CLINICAL DATA:  Headache. EXAM: CT HEAD WITHOUT CONTRAST TECHNIQUE: Contiguous axial images were obtained from the base of the skull through the vertex without intravenous contrast. RADIATION DOSE REDUCTION: This exam was performed according to the departmental dose-optimization program which includes automated exposure control, adjustment of the mA and/or kV according to patient size and/or use of iterative reconstruction technique. COMPARISON:  MRI  head 10/29/2022. FINDINGS: Brain: No evidence of acute infarction, hemorrhage, hydrocephalus, extra-axial collection or mass lesion/mass effect. Patchy white matter hypodensities, nonspecific but compatible with chronic microvascular ischemic disease. Vascular: No hyperdense vessel identified. Skull: No acute fracture. Sinuses/Orbits: Largely clear sinuses.  No acute orbital findings. Other: No mastoid effusions. IMPRESSION: No evidence of acute intracranial abnormality. Electronically Signed   By: Margaretha Sheffield M.D.   On: 12/12/2022 14:02   DG Chest Port 1 View  Result Date: 12/12/2022 CLINICAL DATA:  Fever, hypotension and diarrhea. Started chemotherapy yesterday for stage IV lung cancer. EXAM: PORTABLE CHEST 1 VIEW COMPARISON:  03/22/2020.  Chest CTA dated 11/08/2022 FINDINGS: Normal sized heart. Mildly tortuous and mildly calcified thoracic aorta. Ill-defined increased density at the medial aspect of the right upper lobe compatible with the patient's known malignancy and associated metastatic adenopathy seen on the chest CTA. Additional small nodular density in the lateral aspect of the right upper lung zone with no corresponding abnormality on the previous CT. Possible small nodular density in the lateral aspect of the left upper lung zone in area of overlapping rib and scapula, not previously seen. Otherwise, clear  lungs. No pleural fluid. Thoracic spine degenerative changes. IMPRESSION: 1. No acute abnormality. 2. Ill-defined increased density at the medial aspect of the right upper lobe compatible with the patient's known malignancy and associated metastatic adenopathy seen on the previous chest CTA. 3. Possible interval small nodule in the lateral aspect of each upper lung zone, potentially representing small metastases. Electronically Signed   By: Claudie Revering M.D.   On: 12/12/2022 12:14     Medical Consultants:   None.   Subjective:    Thomas [name] is he feels better no new complaints.  Objective:    Vitals:   12/13/22 2256 12/13/22 2302 12/14/22 0354 12/14/22 0358  BP: (!) 169/68 (!) 157/68 (!) 176/72 (!) 156/64  Pulse: 98 96 91 87  Resp:   20   Temp: 98.6 F (37 C)  99.1 F (37.3 C)   TempSrc: Oral  Oral   SpO2:   94%   Weight:      Height:       SpO2: 94 % O2 Flow Rate (L/min): 2 L/min   Intake/Output Summary (Last 24 hours) at 12/14/2022 0742 Last data filed at 12/14/2022 0300 Gross per 24 hour  Intake 660 ml  Output --  Net 660 ml    Filed Weights   12/12/22 1836  Weight: 90.5 kg    Exam: General exam: In no acute distress. Respiratory system: Good air movement and clear to auscultation. Cardiovascular system: S1 & S2 heard, RRR. No JVD. Gastrointestinal system: Abdomen is nondistended, soft and nontender.  Extremities: No pedal edema. Skin: No rashes, lesions or ulcers Psychiatry: Judgement and insight appear normal. Mood & affect appropriate.   Data Reviewed:    Labs: Basic Metabolic Panel: Recent Labs  Lab 12/10/22 1022 12/12/22 1220 12/13/22 0610 12/14/22 0535  NA 136 135 137 139  K 4.1 3.7 3.8 3.7  CL 99 98 103 101  CO2 32 24 25 27   GLUCOSE 107* 95 86 91  BUN 19 40* 30* 14  CREATININE 1.24 2.23* 1.43* 1.13  CALCIUM 8.6* 8.4* 7.9* 8.3*    GFR Estimated Creatinine Clearance: 71 mL/min (by C-G formula based on SCr of 1.13 mg/dL). Liver  Function Tests: Recent Labs  Lab 12/10/22 1022 12/12/22 1220 12/13/22 0610 12/14/22 0535  AST 30 26 19 24   ALT 55* 43 29 29  ALKPHOS 83 74 56 73  BILITOT 0.5 0.8 0.6 0.7  PROT 6.6 6.6 5.1* 6.0*  ALBUMIN 3.5 3.3* 2.5* 3.0*    No results for input(s): "LIPASE", "AMYLASE" in the last 168 hours. No results for input(s): "AMMONIA" in the last 168 hours. Coagulation profile Recent Labs  Lab 12/12/22 1220  INR 1.2    COVID-19 Labs  No results for input(s): "DDIMER", "FERRITIN", "LDH", "CRP" in the last 72 hours.  Lab Results  Component Value Date   SARSCOV2NAA NEGATIVE 12/12/2022   [place] NEGATIVE 03/22/2020    CBC: Recent Labs  Lab 12/10/22 1022 12/12/22 1220 12/13/22 0610 12/14/22 0535  WBC 1.0* 4.4 5.3 9.4  NEUTROABS 0.6* 3.5 4.2 7.2  HGB 11.3* 10.8* 8.4* 10.1*  HCT 34.9* 34.0* 25.7* 32.2*  MCV 81.5 82.1 82.9 83.9  PLT 172 151 109* 133*    Cardiac Enzymes: No results for input(s): "CKTOTAL", "CKMB", "CKMBINDEX", "TROPONINI" in the last 168 hours. BNP (last 3 results) No results for input(s): "PROBNP" in the last 8760 hours. CBG: No results for input(s): "GLUCAP" in the last 168 hours. D-Dimer: No results for input(s): "DDIMER" in the last 72 hours. Hgb A1c: No results for input(s): "HGBA1C" in the last 72 hours. Lipid Profile: No results for input(s): "CHOL", "HDL", "LDLCALC", "TRIG", "CHOLHDL", "LDLDIRECT" in the last 72 hours. Thyroid function studies: No results for input(s): "TSH", "T4TOTAL", "T3FREE", "THYROIDAB" in the last 72 hours.  Invalid input(s): "FREET3" Anemia work up: No results for input(s): "VITAMINB12", "FOLATE", "FERRITIN", "TIBC", "IRON", "RETICCTPCT" in the last 72 hours. Sepsis Labs: Recent Labs  Lab 12/10/22 1022 12/12/22 1220 12/12/22 1532 12/12/22 1819 12/13/22 0610 12/14/22 0535  WBC 1.0* 4.4  --   --  5.3 9.4  LATICACIDVEN  --  1.0 4.8* 1.4  --   --     Microbiology Recent Results (from the past 240 hour(s))   Resp panel by RT-PCR (RSV, Flu A&B, Covid) Anterior Nasal Swab     Status: None   Collection Time: 12/12/22 11:40 AM   Specimen: Anterior Nasal Swab  Result Value Ref Range Status   SARS Coronavirus 2 by RT PCR NEGATIVE NEGATIVE Final    Comment: (NOTE) SARS-CoV-2 target nucleic acids are NOT DETECTED.  The SARS-CoV-2 RNA is generally detectable in upper respiratory specimens during the acute phase of infection. The lowest concentration of SARS-CoV-2 viral copies this assay can detect is 138 copies/mL. A negative result does not preclude SARS-Cov-2 infection and should not be used as the sole basis for treatment or other patient management decisions. A negative result may occur with  improper specimen collection/handling, submission of specimen other than nasopharyngeal swab, presence of viral mutation(s) within the areas targeted by this assay, and inadequate number of viral copies(<138 copies/mL). A negative result must be combined with clinical observations, patient history, and epidemiological information. The expected result is Negative.  Fact Sheet for Patients:  EntrepreneurPulse.com.au  Fact Sheet for Healthcare Providers:  IncredibleEmployment.be  This test is no t yet approved or cleared by the [place] FDA and  has been authorized for detection and/or diagnosis of SARS-CoV-2 by FDA under an Emergency Use Authorization (EUA). This EUA will remain  in effect (meaning this test can be used) for the duration of the COVID-19 declaration under Section 564(b)(1) of the Act, 21 U.S.C.section 360bbb-3(b)(1), unless the authorization is terminated  or revoked sooner.       Influenza A by PCR NEGATIVE NEGATIVE Final   Influenza B by PCR NEGATIVE NEGATIVE  Final    Comment: (NOTE) The Xpert Xpress SARS-CoV-2/FLU/RSV plus assay is intended as an aid in the diagnosis of influenza from Nasopharyngeal swab specimens and should not be used  as a sole basis for treatment. Nasal washings and aspirates are unacceptable for Xpert Xpress SARS-CoV-2/FLU/RSV testing.  Fact Sheet for Patients: EntrepreneurPulse.com.au  Fact Sheet for Healthcare Providers: IncredibleEmployment.be  This test is not yet approved or cleared by the [place] FDA and has been authorized for detection and/or diagnosis of SARS-CoV-2 by FDA under an Emergency Use Authorization (EUA). This EUA will remain in effect (meaning this test can be used) for the duration of the COVID-19 declaration under Section 564(b)(1) of the Act, 21 U.S.C. section 360bbb-3(b)(1), unless the authorization is terminated or revoked.     Resp Syncytial Virus by PCR NEGATIVE NEGATIVE Final    Comment: (NOTE) Fact Sheet for Patients: EntrepreneurPulse.com.au  Fact Sheet for Healthcare Providers: IncredibleEmployment.be  This test is not yet approved or cleared by the [place] FDA and has been authorized for detection and/or diagnosis of SARS-CoV-2 by FDA under an Emergency Use Authorization (EUA). This EUA will remain in effect (meaning this test can be used) for the duration of the COVID-19 declaration under Section 564(b)(1) of the Act, 21 U.S.C. section 360bbb-3(b)(1), unless the authorization is terminated or revoked.  Performed at [facility], [place] [address] [place], [place] [address]   Blood Culture (routine x 2)     Status: None (Preliminary result)   Collection Time: 12/12/22 12:20 PM   Specimen: Right Antecubital; Blood  Result Value Ref Range Status   Specimen Description   Final    RIGHT ANTECUBITAL BLOOD Performed at [facility], [place] [address] [place], [place] [address]    Special Requests   Final    BOTTLES DRAWN AEROBIC AND ANAEROBIC Blood Culture adequate volume Performed at [place] [address] [place], [place]  [address]    Culture   Final    NO GROWTH 2 DAYS Performed at [place] [address], [place], [place] [address]    Report Status PENDING  Incomplete  Blood Culture (routine x 2)     Status: None (Preliminary result)   Collection Time: 12/12/22 12:20 PM   Specimen: BLOOD RIGHT FOREARM  Result Value Ref Range Status   Specimen Description   Final    BLOOD RIGHT FOREARM Performed at [facility] [address], [place], [place] [address]    Special Requests   Final    BOTTLES DRAWN AEROBIC AND ANAEROBIC Blood Culture adequate volume Performed at [facility] [address], [place], [place] [address]    Culture   Final    NO GROWTH 2 DAYS Performed at [place] [address], [place], [place] [address]    Report Status PENDING  Incomplete     Medications:    allopurinol  300 mg Oral Daily   fentaNYL  1 patch Transdermal Q72H   gabapentin  900 mg Oral QHS   loratadine  10 mg Oral Daily   pantoprazole  40 mg Oral BID   Continuous Infusions:  cefTRIAXone (ROCEPHIN)  IV Stopped (12/13/22 0955)   metronidazole 500 mg (12/13/22 2109)      LOS: 2 days   Charlynne Cousins  [facility]  12/14/2022, 7:42 AM

## 2022-12-15 ENCOUNTER — Encounter: Payer: Self-pay | Admitting: Neurology

## 2022-12-15 ENCOUNTER — Other Ambulatory Visit (HOSPITAL_COMMUNITY): Payer: Self-pay

## 2022-12-15 ENCOUNTER — Inpatient Hospital Stay: Payer: Medicare Other | Admitting: Nurse Practitioner

## 2022-12-15 DIAGNOSIS — K5792 Diverticulitis of intestine, part unspecified, without perforation or abscess without bleeding: Secondary | ICD-10-CM | POA: Diagnosis not present

## 2022-12-15 DIAGNOSIS — A419 Sepsis, unspecified organism: Secondary | ICD-10-CM | POA: Diagnosis not present

## 2022-12-15 DIAGNOSIS — N179 Acute kidney failure, unspecified: Secondary | ICD-10-CM | POA: Diagnosis not present

## 2022-12-15 LAB — CBC WITH DIFFERENTIAL/PLATELET
Abs Immature Granulocytes: 0.09 10*3/uL — ABNORMAL HIGH (ref 0.00–0.07)
Basophils Absolute: 0 10*3/uL (ref 0.0–0.1)
Basophils Relative: 0 %
Eosinophils Absolute: 0.1 10*3/uL (ref 0.0–0.5)
Eosinophils Relative: 1 %
HCT: 30.4 % — ABNORMAL LOW (ref 39.0–52.0)
Hemoglobin: 9.7 g/dL — ABNORMAL LOW (ref 13.0–17.0)
Immature Granulocytes: 2 %
Lymphocytes Relative: 9 %
Lymphs Abs: 0.6 10*3/uL — ABNORMAL LOW (ref 0.7–4.0)
MCH: 26.6 pg (ref 26.0–34.0)
MCHC: 31.9 g/dL (ref 30.0–36.0)
MCV: 83.5 fL (ref 80.0–100.0)
Monocytes Absolute: 0.9 10*3/uL (ref 0.1–1.0)
Monocytes Relative: 15 %
Neutro Abs: 4.5 10*3/uL (ref 1.7–7.7)
Neutrophils Relative %: 73 %
Platelets: 138 10*3/uL — ABNORMAL LOW (ref 150–400)
RBC: 3.64 MIL/uL — ABNORMAL LOW (ref 4.22–5.81)
RDW: 16 % — ABNORMAL HIGH (ref 11.5–15.5)
WBC: 6.1 10*3/uL (ref 4.0–10.5)
nRBC: 0 % (ref 0.0–0.2)

## 2022-12-15 MED ORDER — AMOXICILLIN-POT CLAVULANATE 875-125 MG PO TABS
1.0000 | ORAL_TABLET | Freq: Two times a day (BID) | ORAL | 0 refills | Status: AC
  Filled 2022-12-15: qty 20, 10d supply, fill #0

## 2022-12-15 MED ORDER — IRBESARTAN 150 MG PO TABS
150.0000 mg | ORAL_TABLET | Freq: Every day | ORAL | Status: DC
  Administered 2022-12-15: 150 mg via ORAL
  Filled 2022-12-15: qty 1

## 2022-12-15 MED ORDER — AMOXICILLIN-POT CLAVULANATE 875-125 MG PO TABS
1.0000 | ORAL_TABLET | Freq: Two times a day (BID) | ORAL | Status: DC
  Administered 2022-12-15: 1 via ORAL
  Filled 2022-12-15: qty 1

## 2022-12-15 NOTE — Progress Notes (Signed)
  Transition of Care [facility]) Screening Note   Patient Details  Name: Thomas [name] Date of Birth: 01-19-1951   Transition of Care [facility]) CM/SW Contact:    Dessa Phi, RN Phone Number: 12/15/2022, 9:56 AM    Transition of Care Department [facility]) has reviewed patient and no TOC needs have been identified at this time. We will continue to monitor patient advancement through interdisciplinary progression rounds. If new patient transition needs arise, please place a TOC consult.

## 2022-12-15 NOTE — Progress Notes (Signed)
   12/15/22 0608  Vitals  Temp 99 F (37.2 C)  BP (!) 192/74  MAP (mmHg) 106  BP Location Right Arm  BP Method Automatic  Patient Position (if appropriate) Sitting  Pulse Rate 86  Pulse Rate Source Monitor  Resp 20  MEWS COLOR  MEWS Score Color Green  Oxygen Therapy  SpO2 93 %  O2 Device Room Air  MEWS Score  MEWS Temp 0  MEWS Systolic 0  MEWS Pulse 0  MEWS RR 0  MEWS LOC 0  MEWS Score 0   Pt' BP has been elevated. NT took the other arm too (180s per NT). RN notified to attending on-call and received care order to give scheduled Norvasc (@1000 ) early. Will recheck pt's BP before leaving.

## 2022-12-15 NOTE — Discharge Summary (Addendum)
Physician Discharge Summary  NIKIA MANGINO WEX:[id number] DOB: [birth year] DOA: 12/12/2022  PCP: Patient, No Pcp Per  Admit date: 12/12/2022 Discharge date: 12/15/2022  Admitted From: Home Disposition:  Home  Recommendations for Outpatient Follow-up:  Follow up with PCP in 1-2 weeks Please obtain BMP/CBC in one week   Home Health:no Equipment/Devices:None  Discharge Condition:Stable CODE STATUS:Full Diet recommendation: Heart Healthy  Brief/Interim Summary: 72 y.o. 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 Earlie Server was gotten radiation but no chemotherapy, who comes into the ED due to fever hypotension and was found to be septic, chest x-ray was unremarkable except for right upper lobe density, CT of the head showed no acute findings, CT of the chest shows a tumor in caseating the right upper lobe pulmonary artery which is severely narrow no significant progression of known metastatic disease.  CT abdomen and pelvis showed colonic diverticulosis with subtle pericolonic stranding of the proximal colon, probably acute diverticulitis   Discharge Diagnoses:  Principal Problem:   Sepsis due to undetermined organism [facility]) Active Problems:   Essential hypertension   Gout of multiple sites   Hyperlipidemia   Adenocarcinoma of right lung, stage 4 (HCC)   Normocytic anemia   AKI (acute kidney injury) ([place])   Mild protein malnutrition (HCC)  Severe Sepsis due to undetermined organism in the setting of acute diverticulitis: Started on Rocephin and Flagyl on admission.  Was also fluid resuscitated on admission. He was neutropenic she was Neupogen was given his white blood cell count on day of discharge is 9. He defervesced was able to  tolerate his diet. He was transition to oral Augmentin which she will continue for total of 10 days an outpatient.  Acute kidney injury: Significance inhibitor and sepsis. Resolved with full resuscitation and holding antihypertensive medication.  Adenocarcinoma of the right lung stage IV: With last chemotherapy about 2 weeks prior to admission he came in neutropenic Neupogen was given. His pain is controlled with fentanyl and hydromorphone will continue current treatment.  Normocytic anemia: He is currently asymptomatic his hemoglobin is 9.7 on day of discharge follow-up with Dr. Julien Nordmann as an outpatient in 2 weeks check CBC.  Essential hypertension: No change made to his medication.  Gout of multiple sites: Continue allopurinol.  Hyperlipidemia: Noted.  Mild protein caloric malnutrition: Setting of cancer continue protein supplementation.  Discharge Instructions  Discharge Instructions     Diet - low sodium heart healthy   Complete by: As directed    Increase activity slowly   Complete by: As directed       Allergies as of 12/15/2022       Reactions   Statins Other (See Comments)   Jaw tightness and severe muscle pain- Pravastatin         Medication List     TAKE these medications    acetaminophen 650 MG CR tablet Commonly known as: TYLENOL Take 1,300 mg by mouth every 8 (eight) hours.   allopurinol 300 MG tablet Commonly known as: ZYLOPRIM Take 300 mg by mouth in the morning.   amLODipine 5 MG tablet Commonly known as: NORVASC Take 1 tablet (5 mg total) by mouth daily. What changed: when to take this   amoxicillin-clavulanate 875-125 MG tablet Commonly known as: AUGMENTIN Take 1 tablet by mouth every 12 (twelve)  hours for 10 days.   Claritin 10 MG tablet Generic drug: loratadine Take 10 mg by mouth in the morning.   diphenoxylate-atropine 2.5-0.025 MG tablet Commonly known as: LOMOTIL Take 1 tablet by mouth every 4 (four) hours as needed for  diarrhea or loose stools. Take 2 tablets after the first stool, then take as directed.   fentaNYL 50 MCG/HR Commonly known as: Kidder 1 patch onto the skin every 3 (three) days.   folic acid 1 MG tablet Commonly known as: FOLVITE Take 1 tablet (1 mg total) by mouth daily.   gabapentin 300 MG capsule Commonly known as: NEURONTIN Take 600 mg by mouth every evening.   HYDROmorphone 8 MG tablet Commonly known as: Dilaudid Take 1 tablet (8 mg total) by mouth every 4 (four) hours as needed for severe pain. What changed: when to take this   Imodium A-D 2 MG tablet Generic drug: loperamide Take 2 mg by mouth every 4 (four) hours as needed for diarrhea or loose stools.   minoxidil 10 MG tablet Commonly known as: LONITEN Take 1-1.5 tablets (10-15 mg total) by mouth 2 (two) times daily. TAKE 1 TABLET IN THE MORNING AND TAKE 1/2 TABLET AT NIGHT What changed:  how much to take when to take this additional instructions   ondansetron 4 MG disintegrating tablet Commonly known as: ZOFRAN-ODT Take 1-2 tablets (4-8 mg total) by mouth every 6 (six) hours as needed for nausea or vomiting.   pantoprazole 40 MG tablet Commonly known as: PROTONIX Take 1 tablet (40 mg total) by mouth 2 (two) times daily. What changed: when to take this   polyethylene glycol 17 g packet Commonly known as: MIRALAX / GLYCOLAX Take 17 g by mouth 2 (two) times daily. What changed:  when to take this reasons to take this   PRESCRIPTION MEDICATION Apply 1 Application topically See admin instructions. Compounded pain cream ([place])- apply to the feet twice a day as needed for pain   PRESCRIPTION MEDICATION CPAP- At bedtime   prochlorperazine 10 MG tablet Commonly known as: COMPAZINE Take 1 tablet (10 mg total) by mouth every 6 (six) hours as needed for nausea or vomiting.   senna 8.6 MG Tabs tablet Commonly known as: SENOKOT Take 2 tablets (17.2 mg total) by mouth 2 (two) times  daily.   valsartan 320 MG tablet Commonly known as: DIOVAN TAKE 1 TABLET BY MOUTH EVERY DAY What changed: when to take this        Allergies  Allergen Reactions   Statins Other (See Comments)    Jaw tightness and severe muscle pain- Pravastatin     Consultations: None   Procedures/Studies: CT Angio Chest PE W and/or Wo Contrast  Result Date: 12/12/2022 CLINICAL DATA:  Pulmonary embolism (PE) suspected, high prob; RLQ abdominal pain n/v/d, abd pain RLQ, lung ca. History of lung cancer on chemotherapy EXAM: CT ANGIOGRAPHY CHEST CT ABDOMEN AND PELVIS WITH CONTRAST TECHNIQUE: Multidetector CT imaging of the chest was performed using the standard protocol during bolus administration of intravenous contrast. Multiplanar CT image reconstructions and MIPs were obtained to evaluate the vascular anatomy. Multidetector CT imaging of the abdomen and pelvis was performed using the standard protocol during bolus administration of intravenous contrast. RADIATION DOSE REDUCTION: This exam was performed according to the departmental dose-optimization program which includes automated exposure control, adjustment of the mA and/or kV according to patient size and/or use of iterative reconstruction technique. CONTRAST:  12mL OMNIPAQUE IOHEXOL 350 MG/ML SOLN COMPARISON:  CT  10/28/2022, PET-CT 11/20/2022 FINDINGS: CTA CHEST FINDINGS Cardiovascular: Suboptimal evaluation of the pulmonary arteries secondary to contrast bolus timing, motion artifact, as well as beam hardening artifact secondary to patient's arms positioned against the body. Within this limitation, there is no evidence of a large or central pulmonary embolism. There is tumor encasement of the right upper lobe pulmonary artery which is severely narrowed (series 8, image 79). Heart size is within normal limits. No pericardial effusion. Thoracic aorta is nonaneurysmal. Atherosclerotic calcifications of the aorta and coronary arteries. Mediastinum/Nodes:  Metastatic right paratracheal and right hilar lymphadenopathy, not appreciably changed in size from the recent PET-CT. No axillary or left hilar lymphadenopathy. Thyroid, trachea, and esophagus within normal limits. Lungs/Pleura: 1.6 cm pleural based pulmonary nodule in the medial right upper lobe, unchanged (series 7, image 55). Small right pleural effusion with areas of pleural metastatic disease at the lung bases. Left lung remains clear. No pneumothorax. Musculoskeletal: Erosive changes of the posterior right ninth rib, similar. No pathologic fracture. No new lytic or sclerotic bone lesions are identified. Review of the MIP images confirms the above findings. CT ABDOMEN and PELVIS FINDINGS Hepatobiliary: No new liver abnormality or evidence of hepatic metastatic disease. Moderately dilated gallbladder without hyperdense gallstone or pericholecystic inflammatory changes. No biliary dilatation. Pancreas: Unremarkable. No pancreatic ductal dilatation or surrounding inflammatory changes. Spleen: Normal in size without focal abnormality. Adrenals/Urinary Tract: No adrenal nodule. No solid renal mass. No stone or hydronephrosis. Urinary bladder is obscured by metal artifact from patient's hip prosthesis. Stomach/Bowel: Stomach within normal limits. No abnormally dilated loops of bowel. Colonic diverticulosis with subtle pericolonic stranding of the proximal sigmoid colon (series 3, image 79). No pericolonic fluid collection or extraluminal air. Vascular/Lymphatic: Aortic atherosclerosis. Nonenlarged celiac chain lymph nodes, noted to be hypermetabolic on PET-CT (series 3, image 32). No new or enlarging abdominopelvic lymph nodes. Reproductive: Prostate largely obscured by streak artifact. Other: No free fluid. No abdominopelvic fluid collection. No pneumoperitoneum. Tiny fat containing umbilical hernia. Musculoskeletal: Destructive mass of the posterior right iliac bone with extraosseous soft tissue component (series  3, image 65), not significantly changed. No new bone lesion. Review of the MIP images confirms the above findings. IMPRESSION: 1. Suboptimal evaluation of the pulmonary arteries. See above discussion. Within this limitation, there is no evidence of a large or central pulmonary embolism. There is tumor encasement of the right upper lobe pulmonary artery which is severely narrowed. 2. No significant interval progression of known metastatic lung cancer including right upper lobe lung lesion, mediastinal and right hilar lymphadenopathy, retroperitoneal lymph node involvement, as well as osseous metastatic involvement of the right ninth rib and right iliac bone. 3. Colonic diverticulosis with subtle pericolonic stranding of the proximal sigmoid colon, suggestive of mild acute uncomplicated diverticulitis. 4. Aortic atherosclerosis (ICD10-I70.0). Electronically Signed   By: Davina Poke D.O.   On: 12/12/2022 14:13   CT ABDOMEN PELVIS W CONTRAST  Result Date: 12/12/2022 CLINICAL DATA:  Pulmonary embolism (PE) suspected, high prob; RLQ abdominal pain n/v/d, abd pain RLQ, lung ca. History of lung cancer on chemotherapy EXAM: CT ANGIOGRAPHY CHEST CT ABDOMEN AND PELVIS WITH CONTRAST TECHNIQUE: Multidetector CT imaging of the chest was performed using the standard protocol during bolus administration of intravenous contrast. Multiplanar CT image reconstructions and MIPs were obtained to evaluate the vascular anatomy. Multidetector CT imaging of the abdomen and pelvis was performed using the standard protocol during bolus administration of intravenous contrast. RADIATION DOSE REDUCTION: This exam was performed according to the departmental dose-optimization program  which includes automated exposure control, adjustment of the mA and/or kV according to patient size and/or use of iterative reconstruction technique. CONTRAST:  29mL OMNIPAQUE IOHEXOL 350 MG/ML SOLN COMPARISON:  CT 10/28/2022, PET-CT 11/20/2022 FINDINGS: CTA  CHEST FINDINGS Cardiovascular: Suboptimal evaluation of the pulmonary arteries secondary to contrast bolus timing, motion artifact, as well as beam hardening artifact secondary to patient's arms positioned against the body. Within this limitation, there is no evidence of a large or central pulmonary embolism. There is tumor encasement of the right upper lobe pulmonary artery which is severely narrowed (series 8, image 79). Heart size is within normal limits. No pericardial effusion. Thoracic aorta is nonaneurysmal. Atherosclerotic calcifications of the aorta and coronary arteries. Mediastinum/Nodes: Metastatic right paratracheal and right hilar lymphadenopathy, not appreciably changed in size from the recent PET-CT. No axillary or left hilar lymphadenopathy. Thyroid, trachea, and esophagus within normal limits. Lungs/Pleura: 1.6 cm pleural based pulmonary nodule in the medial right upper lobe, unchanged (series 7, image 55). Small right pleural effusion with areas of pleural metastatic disease at the lung bases. Left lung remains clear. No pneumothorax. Musculoskeletal: Erosive changes of the posterior right ninth rib, similar. No pathologic fracture. No new lytic or sclerotic bone lesions are identified. Review of the MIP images confirms the above findings. CT ABDOMEN and PELVIS FINDINGS Hepatobiliary: No new liver abnormality or evidence of hepatic metastatic disease. Moderately dilated gallbladder without hyperdense gallstone or pericholecystic inflammatory changes. No biliary dilatation. Pancreas: Unremarkable. No pancreatic ductal dilatation or surrounding inflammatory changes. Spleen: Normal in size without focal abnormality. Adrenals/Urinary Tract: No adrenal nodule. No solid renal mass. No stone or hydronephrosis. Urinary bladder is obscured by metal artifact from patient's hip prosthesis. Stomach/Bowel: Stomach within normal limits. No abnormally dilated loops of bowel. Colonic diverticulosis with subtle  pericolonic stranding of the proximal sigmoid colon (series 3, image 79). No pericolonic fluid collection or extraluminal air. Vascular/Lymphatic: Aortic atherosclerosis. Nonenlarged celiac chain lymph nodes, noted to be hypermetabolic on PET-CT (series 3, image 32). No new or enlarging abdominopelvic lymph nodes. Reproductive: Prostate largely obscured by streak artifact. Other: No free fluid. No abdominopelvic fluid collection. No pneumoperitoneum. Tiny fat containing umbilical hernia. Musculoskeletal: Destructive mass of the posterior right iliac bone with extraosseous soft tissue component (series 3, image 65), not significantly changed. No new bone lesion. Review of the MIP images confirms the above findings. IMPRESSION: 1. Suboptimal evaluation of the pulmonary arteries. See above discussion. Within this limitation, there is no evidence of a large or central pulmonary embolism. There is tumor encasement of the right upper lobe pulmonary artery which is severely narrowed. 2. No significant interval progression of known metastatic lung cancer including right upper lobe lung lesion, mediastinal and right hilar lymphadenopathy, retroperitoneal lymph node involvement, as well as osseous metastatic involvement of the right ninth rib and right iliac bone. 3. Colonic diverticulosis with subtle pericolonic stranding of the proximal sigmoid colon, suggestive of mild acute uncomplicated diverticulitis. 4. Aortic atherosclerosis (ICD10-I70.0). Electronically Signed   By: Davina Poke D.O.   On: 12/12/2022 14:13   CT Head Wo Contrast  Result Date: 12/12/2022 CLINICAL DATA:  Headache. EXAM: CT HEAD WITHOUT CONTRAST TECHNIQUE: Contiguous axial images were obtained from the base of the skull through the vertex without intravenous contrast. RADIATION DOSE REDUCTION: This exam was performed according to the departmental dose-optimization program which includes automated exposure control, adjustment of the mA and/or kV  according to patient size and/or use of iterative reconstruction technique. COMPARISON:  MRI head 10/29/2022. FINDINGS:  Brain: No evidence of acute infarction, hemorrhage, hydrocephalus, extra-axial collection or mass lesion/mass effect. Patchy white matter hypodensities, nonspecific but compatible with chronic microvascular ischemic disease. Vascular: No hyperdense vessel identified. Skull: No acute fracture. Sinuses/Orbits: Largely clear sinuses.  No acute orbital findings. Other: No mastoid effusions. IMPRESSION: No evidence of acute intracranial abnormality. Electronically Signed   By: Margaretha Sheffield M.D.   On: 12/12/2022 14:02   DG Chest Port 1 View  Result Date: 12/12/2022 CLINICAL DATA:  Fever, hypotension and diarrhea. Started chemotherapy yesterday for stage IV lung cancer. EXAM: PORTABLE CHEST 1 VIEW COMPARISON:  03/22/2020.  Chest CTA dated 11/08/2022 FINDINGS: Normal sized heart. Mildly tortuous and mildly calcified thoracic aorta. Ill-defined increased density at the medial aspect of the right upper lobe compatible with the patient's known malignancy and associated metastatic adenopathy seen on the chest CTA. Additional small nodular density in the lateral aspect of the right upper lung zone with no corresponding abnormality on the previous CT. Possible small nodular density in the lateral aspect of the left upper lung zone in area of overlapping rib and scapula, not previously seen. Otherwise, clear lungs. No pleural fluid. Thoracic spine degenerative changes. IMPRESSION: 1. No acute abnormality. 2. Ill-defined increased density at the medial aspect of the right upper lobe compatible with the patient's known malignancy and associated metastatic adenopathy seen on the previous chest CTA. 3. Possible interval small nodule in the lateral aspect of each upper lung zone, potentially representing small metastases. Electronically Signed   By: Claudie Revering M.D.   On: 12/12/2022 12:14   NM PET Image  Initial (PI) Skull Base To Thigh (F-18 FDG)  Result Date: 11/21/2022 CLINICAL DATA:  Initial treatment strategy for non-small cell lung cancer. EXAM: NUCLEAR MEDICINE PET SKULL BASE TO THIGH TECHNIQUE: 10.13 mCi F-18 FDG was injected intravenously. Full-ring PET imaging was performed from the skull base to thigh after the radiotracer. CT data was obtained and used for attenuation correction and anatomic localization. Fasting blood glucose: 98 mg/dl COMPARISON:  CT scan 10/28/2022 FINDINGS: Mediastinal blood pool activity: SUV max 2.05 Liver activity: SUV max NA NECK: No hypermetabolic lymph nodes in the neck. Incidental CT findings: 12 mm low-attenuation lesion associated with the left thyroid lobe does not show any hypermetabolism and is likely benign. No further imaging evaluation or follow-up is necessary. CHEST: Medial right upper lobe pulmonary nodule measuring 14 mm demonstrates hypermetabolism with SUV max of 4.17. Extensive right-sided paratracheal and hilar necrotic adenopathy with SUV max of 14.84. Moderate-sized right pleural effusion with high attenuation material demonstrating hypermetabolism. This is likely pleural metastatic disease. The adjacent right ninth rib demonstrates cortical destruction of the deep cortex. SUV max is 6.12. Second hypermetabolic pleural nodule is noted more superiorly with SUV max of 4.42. No obvious pulmonary metastatic nodules. No chest wall mass is identified. Small, 6 mm left supraclavicular lymph node on image 70/3 has an SUV max of 2.26 and could be metastatic disease or reactive change. Incidental CT findings: Aortic and coronary artery calcifications. ABDOMEN/PELVIS: No evidence of hepatic or adrenal gland metastasis. Hypermetabolic lymph node between the celiac axis and the IVC measures 10 mm and has an SUV max of 5.03. Moderate scattered uptake in the colon stomach but no definite lesions. Incidental CT findings: Age advanced atherosclerotic calcifications involving  the aorta and iliac arteries but no aneurysm. Surgical changes noted in the right abdomen. Small periumbilical abdominal wall hernia containing fat. SKELETON: Large destructive metastatic bone lesion involving the right iliac bone demonstrates  hypermetabolism with SUV max of 9.76. Right eleventh rib metastasis versus is adjacent implant. Incidental CT findings: None. IMPRESSION: 1. Hypermetabolic right upper lobe pulmonary lesion and adjacent extensive necrotic hypermetabolic mediastinal and right hilar adenopathy. 2. Right-sided pleural metastatic lesions in malignant right pleural effusion. 3. Osseous metastatic disease. 4. Single hypermetabolic retroperitoneal lymph node is likely metastatic. No hepatic or adrenal gland metastasis. 5. Small hypermetabolic left supraclavicular node could be reactive or metastatic disease. 6. Age advanced vascular disease. Electronically Signed   By: Marijo Sanes M.D.   On: 11/21/2022 17:19     Subjective: No complaints  Discharge Exam: Vitals:   12/15/22 0608 12/15/22 0701  BP: (!) 192/74 (!) 177/72  Pulse: 86 85  Resp: 20   Temp: 99 F (37.2 C)   SpO2: 93%    Vitals:   12/14/22 2241 12/14/22 2244 12/15/22 0608 12/15/22 0701  BP: (!) 176/65 (!) 171/68 (!) 192/74 (!) 177/72  Pulse: 77 71 86 85  Resp:   20   Temp:   99 F (37.2 C)   TempSrc:      SpO2:   93%   Weight:      Height:        General: Pt is alert, awake, not in acute distress Cardiovascular: RRR, S1/S2 +, no rubs, no gallops Respiratory: CTA bilaterally, no wheezing, no rhonchi Abdominal: Soft, NT, ND, bowel sounds + Extremities: no edema, no cyanosis    The results of significant diagnostics from this hospitalization (including imaging, microbiology, ancillary and laboratory) are listed below for reference.     Microbiology: Recent Results (from the past 240 hour(s))  Resp panel by RT-PCR (RSV, Flu A&B, Covid) Anterior Nasal Swab     Status: None   Collection Time: 12/12/22  11:40 AM   Specimen: Anterior Nasal Swab  Result Value Ref Range Status   SARS Coronavirus 2 by RT PCR NEGATIVE NEGATIVE Final    Comment: (NOTE) SARS-CoV-2 target nucleic acids are NOT DETECTED.  The SARS-CoV-2 RNA is generally detectable in upper respiratory specimens during the acute phase of infection. The lowest concentration of SARS-CoV-2 viral copies this assay can detect is 138 copies/mL. A negative result does not preclude SARS-Cov-2 infection and should not be used as the sole basis for treatment or other patient management decisions. A negative result may occur with  improper specimen collection/handling, submission of specimen other than nasopharyngeal swab, presence of viral mutation(s) within the areas targeted by this assay, and inadequate number of viral copies(<138 copies/mL). A negative result must be combined with clinical observations, patient history, and epidemiological information. The expected result is Negative.  Fact Sheet for Patients:  EntrepreneurPulse.com.au  Fact Sheet for Healthcare Providers:  IncredibleEmployment.be  This test is no t yet approved or cleared by the [place] FDA and  has been authorized for detection and/or diagnosis of SARS-CoV-2 by FDA under an Emergency Use Authorization (EUA). This EUA will remain  in effect (meaning this test can be used) for the duration of the COVID-19 declaration under Section 564(b)(1) of the Act, 21 U.S.C.section 360bbb-3(b)(1), unless the authorization is terminated  or revoked sooner.       Influenza A by PCR NEGATIVE NEGATIVE Final   Influenza B by PCR NEGATIVE NEGATIVE Final    Comment: (NOTE) The Xpert Xpress SARS-CoV-2/FLU/RSV plus assay is intended as an aid in the diagnosis of influenza from Nasopharyngeal swab specimens and should not be used as a sole basis for treatment. Nasal washings and aspirates are unacceptable  for Xpert Xpress  SARS-CoV-2/FLU/RSV testing.  Fact Sheet for Patients: EntrepreneurPulse.com.au  Fact Sheet for Healthcare Providers: IncredibleEmployment.be  This test is not yet approved or cleared by the [place] FDA and has been authorized for detection and/or diagnosis of SARS-CoV-2 by FDA under an Emergency Use Authorization (EUA). This EUA will remain in effect (meaning this test can be used) for the duration of the COVID-19 declaration under Section 564(b)(1) of the Act, 21 U.S.C. section 360bbb-3(b)(1), unless the authorization is terminated or revoked.     Resp Syncytial Virus by PCR NEGATIVE NEGATIVE Final    Comment: (NOTE) Fact Sheet for Patients: EntrepreneurPulse.com.au  Fact Sheet for Healthcare Providers: IncredibleEmployment.be  This test is not yet approved or cleared by the [place] FDA and has been authorized for detection and/or diagnosis of SARS-CoV-2 by FDA under an Emergency Use Authorization (EUA). This EUA will remain in effect (meaning this test can be used) for the duration of the COVID-19 declaration under Section 564(b)(1) of the Act, 21 U.S.C. section 360bbb-3(b)(1), unless the authorization is terminated or revoked.  Performed at [facility], [place] [address] [place], [place] [address]   Blood Culture (routine x 2)     Status: None (Preliminary result)   Collection Time: 12/12/22 12:20 PM   Specimen: Right Antecubital; Blood  Result Value Ref Range Status   Specimen Description   Final    RIGHT ANTECUBITAL BLOOD Performed at [facility], [place] [address] [place], [place] [address]    Special Requests   Final    BOTTLES DRAWN AEROBIC AND ANAEROBIC Blood Culture adequate volume Performed at [place] [address], [place], [place] [address]    Culture   Final    NO GROWTH 3 DAYS Performed at [facility], [place]  [address], [place], [place] [address]    Report Status PENDING  Incomplete  Blood Culture (routine x 2)     Status: None (Preliminary result)   Collection Time: 12/12/22 12:20 PM   Specimen: BLOOD RIGHT FOREARM  Result Value Ref Range Status   Specimen Description   Final    BLOOD RIGHT FOREARM Performed at [facility] [address], [place], [place] [address]    Special Requests   Final    BOTTLES DRAWN AEROBIC AND ANAEROBIC Blood Culture adequate volume Performed at [facility] [address] [place], [place] [address]    Culture   Final    NO GROWTH 3 DAYS Performed at [facility], [place] [address] [place], [place] [address]    Report Status PENDING  Incomplete     Labs: BNP (last 3 results) Recent Labs    12/12/22 1220  BNP 99.3   Basic Metabolic Panel: Recent Labs  Lab 12/10/22 1022 12/12/22 1220 12/13/22 0610 12/14/22 0535  NA 136 135 137 139  K 4.1 3.7 3.8 3.7  CL 99 98 103 101  CO2 32 24 25 27   GLUCOSE 107* 95 86 91  BUN 19 40* 30* 14  CREATININE 1.24 2.23* 1.43* 1.13  CALCIUM 8.6* 8.4* 7.9* 8.3*   Liver Function Tests: Recent Labs  Lab 12/10/22 1022 12/12/22 1220 12/13/22 0610 12/14/22 0535  AST 30 26 19 24   ALT 55* 43 29 29  ALKPHOS 83 74 56 73  BILITOT 0.5 0.8 0.6 0.7  PROT 6.6 6.6 5.1* 6.0*  ALBUMIN 3.5 3.3* 2.5* 3.0*   No results for input(s): "LIPASE", "AMYLASE" in  the last 168 hours. No results for input(s): "AMMONIA" in the last 168 hours. CBC: Recent Labs  Lab 12/10/22 1022 12/12/22 1220 12/13/22 0610 12/14/22 0535 12/15/22 0452  WBC 1.0* 4.4 5.3 9.4 6.1  NEUTROABS 0.6* 3.5 4.2 7.2 4.5  HGB 11.3* 10.8* 8.4* 10.1* 9.7*  HCT 34.9* 34.0* 25.7* 32.2* 30.4*  MCV 81.5 82.1 82.9 83.9 83.5  PLT 172 151 109* 133* 138*   Cardiac Enzymes: No results for input(s): "CKTOTAL", "CKMB", "CKMBINDEX", "TROPONINI" in the last 168 hours. BNP: Invalid input(s): "POCBNP" CBG: No results for input(s):  "GLUCAP" in the last 168 hours. D-Dimer No results for input(s): "DDIMER" in the last 72 hours. Hgb A1c No results for input(s): "HGBA1C" in the last 72 hours. Lipid Profile No results for input(s): "CHOL", "HDL", "LDLCALC", "TRIG", "CHOLHDL", "LDLDIRECT" in the last 72 hours. Thyroid function studies No results for input(s): "TSH", "T4TOTAL", "T3FREE", "THYROIDAB" in the last 72 hours.  Invalid input(s): "FREET3" Anemia work up No results for input(s): "VITAMINB12", "FOLATE", "FERRITIN", "TIBC", "IRON", "RETICCTPCT" in the last 72 hours. Urinalysis    Component Value Date/Time   COLORURINE YELLOW 12/12/2022 1400   APPEARANCEUR CLEAR 12/12/2022 1400   LABSPEC >1.046 (H) 12/12/2022 1400   PHURINE 5.0 12/12/2022 1400   GLUCOSEU NEGATIVE 12/12/2022 1400   HGBUR NEGATIVE 12/12/2022 1400   BILIRUBINUR NEGATIVE 12/12/2022 1400   BILIRUBINUR neg 08/27/2015 1103   KETONESUR 20 (A) 12/12/2022 1400   PROTEINUR NEGATIVE 12/12/2022 1400   UROBILINOGEN 0.2 08/27/2015 1103   NITRITE NEGATIVE 12/12/2022 1400   LEUKOCYTESUR NEGATIVE 12/12/2022 1400   Sepsis Labs Recent Labs  Lab 12/12/22 1220 12/13/22 0610 12/14/22 0535 12/15/22 0452  WBC 4.4 5.3 9.4 6.1   Microbiology Recent Results (from the past 240 hour(s))  Resp panel by RT-PCR (RSV, Flu A&B, Covid) Anterior Nasal Swab     Status: None   Collection Time: 12/12/22 11:40 AM   Specimen: Anterior Nasal Swab  Result Value Ref Range Status   SARS Coronavirus 2 by RT PCR NEGATIVE NEGATIVE Final    Comment: (NOTE) SARS-CoV-2 target nucleic acids are NOT DETECTED.  The SARS-CoV-2 RNA is generally detectable in upper respiratory specimens during the acute phase of infection. The lowest concentration of SARS-CoV-2 viral copies this assay can detect is 138 copies/mL. A negative result does not preclude SARS-Cov-2 infection and should not be used as the sole basis for treatment or other patient management decisions. A negative result  may occur with  improper specimen collection/handling, submission of specimen other than nasopharyngeal swab, presence of viral mutation(s) within the areas targeted by this assay, and inadequate number of viral copies(<138 copies/mL). A negative result must be combined with clinical observations, patient history, and epidemiological information. The expected result is Negative.  Fact Sheet for Patients:  EntrepreneurPulse.com.au  Fact Sheet for Healthcare Providers:  IncredibleEmployment.be  This test is no t yet approved or cleared by the [place] FDA and  has been authorized for detection and/or diagnosis of SARS-CoV-2 by FDA under an Emergency Use Authorization (EUA). This EUA will remain  in effect (meaning this test can be used) for the duration of the COVID-19 declaration under Section 564(b)(1) of the Act, 21 U.S.C.section 360bbb-3(b)(1), unless the authorization is terminated  or revoked sooner.       Influenza A by PCR NEGATIVE NEGATIVE Final   Influenza B by PCR NEGATIVE NEGATIVE Final    Comment: (NOTE) The Xpert Xpress SARS-CoV-2/FLU/RSV plus assay is intended as an aid in the diagnosis of  influenza from Nasopharyngeal swab specimens and should not be used as a sole basis for treatment. Nasal washings and aspirates are unacceptable for Xpert Xpress SARS-CoV-2/FLU/RSV testing.  Fact Sheet for Patients: EntrepreneurPulse.com.au  Fact Sheet for Healthcare Providers: IncredibleEmployment.be  This test is not yet approved or cleared by the [place] FDA and has been authorized for detection and/or diagnosis of SARS-CoV-2 by FDA under an Emergency Use Authorization (EUA). This EUA will remain in effect (meaning this test can be used) for the duration of the COVID-19 declaration under Section 564(b)(1) of the Act, 21 U.S.C. section 360bbb-3(b)(1), unless the authorization is terminated  or revoked.     Resp Syncytial Virus by PCR NEGATIVE NEGATIVE Final    Comment: (NOTE) Fact Sheet for Patients: EntrepreneurPulse.com.au  Fact Sheet for Healthcare Providers: IncredibleEmployment.be  This test is not yet approved or cleared by the [place] FDA and has been authorized for detection and/or diagnosis of SARS-CoV-2 by FDA under an Emergency Use Authorization (EUA). This EUA will remain in effect (meaning this test can be used) for the duration of the COVID-19 declaration under Section 564(b)(1) of the Act, 21 U.S.C. section 360bbb-3(b)(1), unless the authorization is terminated or revoked.  Performed at [facility], [place] [address] [place], [place] [address]   Blood Culture (routine x 2)     Status: None (Preliminary result)   Collection Time: 12/12/22 12:20 PM   Specimen: Right Antecubital; Blood  Result Value Ref Range Status   Specimen Description   Final    RIGHT ANTECUBITAL BLOOD Performed at [facility], [place] [address] [place], [place] [address]    Special Requests   Final    BOTTLES DRAWN AEROBIC AND ANAEROBIC Blood Culture adequate volume Performed at [facility] [address], [place], [place] [address]    Culture   Final    NO GROWTH 3 DAYS Performed at [facility], [place] [address] [place], [place] [address]    Report Status PENDING  Incomplete  Blood Culture (routine x 2)     Status: None (Preliminary result)   Collection Time: 12/12/22 12:20 PM   Specimen: BLOOD RIGHT FOREARM  Result Value Ref Range Status   Specimen Description   Final    BLOOD RIGHT FOREARM Performed at [facility] [address] [place], [place] [address]    Special Requests   Final    BOTTLES DRAWN AEROBIC AND ANAEROBIC Blood Culture adequate volume Performed at [facility] [address], [place], [place] [address]    Culture    Final    NO GROWTH 3 DAYS Performed at [facility], [place] [address], [place], [place] [address]    Report Status PENDING  Incomplete     Time coordinating discharge: Over 30 minutes  SIGNED:   Charlynne Cousins, MD  [facility] 12/15/2022, 8:45 AM Pager   If 7PM-7AM, please contact night-coverage [email or web address] Password [email or web address]

## 2022-12-16 ENCOUNTER — Other Ambulatory Visit (HOSPITAL_COMMUNITY): Payer: Self-pay

## 2022-12-16 ENCOUNTER — Other Ambulatory Visit: Payer: Self-pay

## 2022-12-17 ENCOUNTER — Other Ambulatory Visit (HOSPITAL_COMMUNITY): Payer: Self-pay

## 2022-12-17 LAB — CULTURE, BLOOD (ROUTINE X 2)
Culture: NO GROWTH
Culture: NO GROWTH
Special Requests: ADEQUATE
Special Requests: ADEQUATE

## 2022-12-17 NOTE — Telephone Encounter (Signed)
Received a 90 day report for autopap 09/18/22 - 12/16/22. Report given to Dr Rexene Alberts for review and recommendations.

## 2022-12-17 NOTE — Telephone Encounter (Signed)
I reviewed patient's AutoPap compliance data for the past 90 days, compliance is excellent, average usage for days on treatment of 5 hours and 52 minutes, percent use days greater than 4 hours at 81.1%.  Average AHI 3.8/h, average leak on the low side.  No adjustments necessary from my end of things.

## 2022-12-18 ENCOUNTER — Inpatient Hospital Stay: Payer: Medicare Other | Attending: Internal Medicine

## 2022-12-18 DIAGNOSIS — M109 Gout, unspecified: Secondary | ICD-10-CM | POA: Insufficient documentation

## 2022-12-18 DIAGNOSIS — I10 Essential (primary) hypertension: Secondary | ICD-10-CM | POA: Insufficient documentation

## 2022-12-18 DIAGNOSIS — Z87442 Personal history of urinary calculi: Secondary | ICD-10-CM | POA: Insufficient documentation

## 2022-12-18 DIAGNOSIS — C3411 Malignant neoplasm of upper lobe, right bronchus or lung: Secondary | ICD-10-CM | POA: Insufficient documentation

## 2022-12-18 DIAGNOSIS — G4700 Insomnia, unspecified: Secondary | ICD-10-CM | POA: Diagnosis not present

## 2022-12-18 DIAGNOSIS — K219 Gastro-esophageal reflux disease without esophagitis: Secondary | ICD-10-CM | POA: Diagnosis not present

## 2022-12-18 DIAGNOSIS — G893 Neoplasm related pain (acute) (chronic): Secondary | ICD-10-CM | POA: Diagnosis not present

## 2022-12-18 DIAGNOSIS — Z923 Personal history of irradiation: Secondary | ICD-10-CM | POA: Diagnosis not present

## 2022-12-18 DIAGNOSIS — K429 Umbilical hernia without obstruction or gangrene: Secondary | ICD-10-CM | POA: Insufficient documentation

## 2022-12-18 DIAGNOSIS — C3491 Malignant neoplasm of unspecified part of right bronchus or lung: Secondary | ICD-10-CM

## 2022-12-18 DIAGNOSIS — Z8673 Personal history of transient ischemic attack (TIA), and cerebral infarction without residual deficits: Secondary | ICD-10-CM | POA: Diagnosis not present

## 2022-12-18 DIAGNOSIS — Z87891 Personal history of nicotine dependence: Secondary | ICD-10-CM | POA: Diagnosis not present

## 2022-12-18 DIAGNOSIS — E860 Dehydration: Secondary | ICD-10-CM | POA: Diagnosis not present

## 2022-12-18 DIAGNOSIS — E785 Hyperlipidemia, unspecified: Secondary | ICD-10-CM | POA: Diagnosis not present

## 2022-12-18 DIAGNOSIS — Z5111 Encounter for antineoplastic chemotherapy: Secondary | ICD-10-CM | POA: Insufficient documentation

## 2022-12-18 LAB — CBC WITH DIFFERENTIAL (CANCER CENTER ONLY)
Abs Immature Granulocytes: 0.23 10*3/uL — ABNORMAL HIGH (ref 0.00–0.07)
Basophils Absolute: 0 10*3/uL (ref 0.0–0.1)
Basophils Relative: 0 %
Eosinophils Absolute: 0 10*3/uL (ref 0.0–0.5)
Eosinophils Relative: 0 %
HCT: 32.2 % — ABNORMAL LOW (ref 39.0–52.0)
Hemoglobin: 10.4 g/dL — ABNORMAL LOW (ref 13.0–17.0)
Immature Granulocytes: 3 %
Lymphocytes Relative: 6 %
Lymphs Abs: 0.4 10*3/uL — ABNORMAL LOW (ref 0.7–4.0)
MCH: 26.7 pg (ref 26.0–34.0)
MCHC: 32.3 g/dL (ref 30.0–36.0)
MCV: 82.6 fL (ref 80.0–100.0)
Monocytes Absolute: 0.6 10*3/uL (ref 0.1–1.0)
Monocytes Relative: 8 %
Neutro Abs: 5.9 10*3/uL (ref 1.7–7.7)
Neutrophils Relative %: 83 %
Platelet Count: 208 10*3/uL (ref 150–400)
RBC: 3.9 MIL/uL — ABNORMAL LOW (ref 4.22–5.81)
RDW: 16.6 % — ABNORMAL HIGH (ref 11.5–15.5)
WBC Count: 7.1 10*3/uL (ref 4.0–10.5)
nRBC: 0 % (ref 0.0–0.2)

## 2022-12-18 LAB — CMP (CANCER CENTER ONLY)
ALT: 23 U/L (ref 0–44)
AST: 24 U/L (ref 15–41)
Albumin: 3.5 g/dL (ref 3.5–5.0)
Alkaline Phosphatase: 77 U/L (ref 38–126)
Anion gap: 8 (ref 5–15)
BUN: 13 mg/dL (ref 8–23)
CO2: 28 mmol/L (ref 22–32)
Calcium: 8.3 mg/dL — ABNORMAL LOW (ref 8.9–10.3)
Chloride: 101 mmol/L (ref 98–111)
Creatinine: 0.97 mg/dL (ref 0.61–1.24)
GFR, Estimated: >60 mL/min (ref >60)
Glucose, Bld: 112 mg/dL — ABNORMAL HIGH (ref 70–99)
Potassium: 3.6 mmol/L (ref 3.5–5.1)
Sodium: 137 mmol/L (ref 135–145)
Total Bilirubin: 0.4 mg/dL (ref 0.3–1.2)
Total Protein: 6.5 g/dL (ref 6.5–8.1)

## 2022-12-21 ENCOUNTER — Encounter: Payer: Self-pay | Admitting: Internal Medicine

## 2022-12-23 ENCOUNTER — Telehealth: Payer: Self-pay | Admitting: Internal Medicine

## 2022-12-23 ENCOUNTER — Telehealth: Payer: Self-pay | Admitting: Medical Oncology

## 2022-12-23 ENCOUNTER — Other Ambulatory Visit: Payer: Self-pay | Admitting: Medical Oncology

## 2022-12-23 DIAGNOSIS — C3491 Malignant neoplasm of unspecified part of right bronchus or lung: Secondary | ICD-10-CM

## 2022-12-23 NOTE — Telephone Encounter (Signed)
Called patient regarding upcoming February-March appointments, patient is notified.

## 2022-12-23 NOTE — Telephone Encounter (Signed)
I told him to keep lab and f/u appt Thursday.  He requests home health and aide-He said [employer] will cover these services. I told him to contact his insurance provider for a list of approved home health agencies. Will f/u on Thursday.

## 2022-12-23 NOTE — Telephone Encounter (Signed)
Dtr advocating for her father re home health referral. She said [facility] is in network with his insurance.   Home health referral sent to [facility].

## 2022-12-23 NOTE — Telephone Encounter (Signed)
-----   Message from Curt Bears, MD sent at 12/22/2022 12:57 PM EST ----- We definitely can delay his treatment.  He may need to come for repeat blood work at his scheduled appointment for discussion of the best next step in his treatment. Thank you ----- Message ----- From: Estella Husk, LPN Sent: 04/24/1593  70:76 AM EST To: Curt Bears, MD; Ardeen Garland, RN  Dr. Julien Nordmann,  This patient sent the following message on My Chart.  Please advise.  Having been hospitalized for the results of side effects from last chemotherapy and feeling the diverticulitis is not yet resolved by evidence of a continued fever over the last 5 days, twice exceeding the 100.5 limit(which I called in to the nurse line,) struggle with nausea, and only today have my first solid stools. My strength is way low, and am feeling tired most of the time.  Am I really ready for a second infusion this Thursday? Are there reasons why we shouldn't delay until I feel better? Frankly I'm not feeling ready and strong enough to do another round at this point. Can we please discuss?   Many thanks

## 2022-12-25 ENCOUNTER — Other Ambulatory Visit (HOSPITAL_COMMUNITY): Payer: Self-pay

## 2022-12-25 ENCOUNTER — Encounter: Payer: Self-pay | Admitting: Nurse Practitioner

## 2022-12-25 ENCOUNTER — Inpatient Hospital Stay: Payer: Medicare Other

## 2022-12-25 ENCOUNTER — Inpatient Hospital Stay (HOSPITAL_BASED_OUTPATIENT_CLINIC_OR_DEPARTMENT_OTHER): Payer: Medicare Other | Admitting: Nurse Practitioner

## 2022-12-25 ENCOUNTER — Telehealth: Payer: Self-pay | Admitting: *Deleted

## 2022-12-25 ENCOUNTER — Inpatient Hospital Stay (HOSPITAL_BASED_OUTPATIENT_CLINIC_OR_DEPARTMENT_OTHER): Payer: Medicare Other | Admitting: Internal Medicine

## 2022-12-25 VITALS — BP 135/65 | HR 120 | Temp 98.7°F | Resp 16 | Wt 192.2 lb

## 2022-12-25 DIAGNOSIS — R630 Anorexia: Secondary | ICD-10-CM

## 2022-12-25 DIAGNOSIS — C3491 Malignant neoplasm of unspecified part of right bronchus or lung: Secondary | ICD-10-CM

## 2022-12-25 DIAGNOSIS — R530 Neoplastic (malignant) related fatigue: Secondary | ICD-10-CM | POA: Diagnosis not present

## 2022-12-25 DIAGNOSIS — R110 Nausea: Secondary | ICD-10-CM

## 2022-12-25 DIAGNOSIS — C3490 Malignant neoplasm of unspecified part of unspecified bronchus or lung: Secondary | ICD-10-CM

## 2022-12-25 DIAGNOSIS — Z515 Encounter for palliative care: Secondary | ICD-10-CM | POA: Diagnosis not present

## 2022-12-25 DIAGNOSIS — C3411 Malignant neoplasm of upper lobe, right bronchus or lung: Secondary | ICD-10-CM | POA: Diagnosis not present

## 2022-12-25 DIAGNOSIS — E860 Dehydration: Secondary | ICD-10-CM

## 2022-12-25 DIAGNOSIS — G893 Neoplasm related pain (acute) (chronic): Secondary | ICD-10-CM

## 2022-12-25 DIAGNOSIS — M898X5 Other specified disorders of bone, thigh: Secondary | ICD-10-CM

## 2022-12-25 LAB — CMP (CANCER CENTER ONLY)
ALT: 19 U/L (ref 0–44)
AST: 22 U/L (ref 15–41)
Albumin: 3.4 g/dL — ABNORMAL LOW (ref 3.5–5.0)
Alkaline Phosphatase: 73 U/L (ref 38–126)
Anion gap: 8 (ref 5–15)
BUN: 19 mg/dL (ref 8–23)
CO2: 28 mmol/L (ref 22–32)
Calcium: 8.9 mg/dL (ref 8.9–10.3)
Chloride: 101 mmol/L (ref 98–111)
Creatinine: 1.19 mg/dL (ref 0.61–1.24)
GFR, Estimated: >60 mL/min (ref >60)
Glucose, Bld: 115 mg/dL — ABNORMAL HIGH (ref 70–99)
Potassium: 3.9 mmol/L (ref 3.5–5.1)
Sodium: 137 mmol/L (ref 135–145)
Total Bilirubin: 0.4 mg/dL (ref 0.3–1.2)
Total Protein: 6.1 g/dL — ABNORMAL LOW (ref 6.5–8.1)

## 2022-12-25 LAB — CBC WITH DIFFERENTIAL (CANCER CENTER ONLY)
Abs Immature Granulocytes: 0.04 10*3/uL (ref 0.00–0.07)
Basophils Absolute: 0 10*3/uL (ref 0.0–0.1)
Basophils Relative: 0 %
Eosinophils Absolute: 0 10*3/uL (ref 0.0–0.5)
Eosinophils Relative: 1 %
HCT: 30 % — ABNORMAL LOW (ref 39.0–52.0)
Hemoglobin: 9.6 g/dL — ABNORMAL LOW (ref 13.0–17.0)
Immature Granulocytes: 1 %
Lymphocytes Relative: 7 %
Lymphs Abs: 0.4 10*3/uL — ABNORMAL LOW (ref 0.7–4.0)
MCH: 26.8 pg (ref 26.0–34.0)
MCHC: 32 g/dL (ref 30.0–36.0)
MCV: 83.8 fL (ref 80.0–100.0)
Monocytes Absolute: 0.6 10*3/uL (ref 0.1–1.0)
Monocytes Relative: 10 %
Neutro Abs: 4.6 10*3/uL (ref 1.7–7.7)
Neutrophils Relative %: 81 %
Platelet Count: 370 10*3/uL (ref 150–400)
RBC: 3.58 MIL/uL — ABNORMAL LOW (ref 4.22–5.81)
RDW: 16.9 % — ABNORMAL HIGH (ref 11.5–15.5)
WBC Count: 5.7 10*3/uL (ref 4.0–10.5)
nRBC: 0 % (ref 0.0–0.2)

## 2022-12-25 MED ORDER — SODIUM CHLORIDE 0.9 % IV SOLN
150.0000 mg | Freq: Once | INTRAVENOUS | Status: AC
  Administered 2022-12-25: 150 mg via INTRAVENOUS
  Filled 2022-12-25: qty 150

## 2022-12-25 MED ORDER — HYDROMORPHONE HCL 8 MG PO TABS
8.0000 mg | ORAL_TABLET | ORAL | 0 refills | Status: DC | PRN
  Filled 2022-12-25: qty 90, 15d supply, fill #0

## 2022-12-25 MED ORDER — SODIUM CHLORIDE 0.9 % IV SOLN
10.0000 mg | Freq: Once | INTRAVENOUS | Status: AC
  Administered 2022-12-25: 10 mg via INTRAVENOUS
  Filled 2022-12-25: qty 10

## 2022-12-25 MED ORDER — SODIUM CHLORIDE 0.9 % IV SOLN
501.0000 mg | Freq: Once | INTRAVENOUS | Status: AC
  Administered 2022-12-25: 500 mg via INTRAVENOUS
  Filled 2022-12-25: qty 50

## 2022-12-25 MED ORDER — SODIUM CHLORIDE 0.9 % IV SOLN: Freq: Once | INTRAVENOUS | Status: AC

## 2022-12-25 MED ORDER — SODIUM CHLORIDE 0.9 % IV SOLN: INTRAVENOUS | Status: AC

## 2022-12-25 MED ORDER — PALONOSETRON HCL INJECTION 0.25 MG/5ML
0.2500 mg | Freq: Once | INTRAVENOUS | Status: AC
  Administered 2022-12-25: 0.25 mg via INTRAVENOUS
  Filled 2022-12-25: qty 5

## 2022-12-25 MED ORDER — LIDOCAINE-PRILOCAINE 2.5-2.5 % EX CREA: TOPICAL_CREAM | CUTANEOUS | 0 refills | Status: DC

## 2022-12-25 MED ORDER — SODIUM CHLORIDE 0.9 % IV SOLN
500.0000 mg/m2 | Freq: Once | INTRAVENOUS | Status: AC
  Administered 2022-12-25: 1100 mg via INTRAVENOUS
  Filled 2022-12-25: qty 40

## 2022-12-25 MED ORDER — METHYLPREDNISOLONE 4 MG PO TBPK: ORAL_TABLET | ORAL | 0 refills | Status: DC

## 2022-12-25 NOTE — Progress Notes (Signed)
[facility]  [phone] [phone]) [phone]   Name: Thomas [name] Date: 12/25/2022 MRN: [id number]  DOB: Aug 07, 1951  Patient Care Team: Patient, No Pcp Per as PCP - General ([place]) Park Liter, MD as PCP - Cardiology (Cardiology) Pickenpack-Cousar, Carlena Sax, NP as Nurse Practitioner (Nurse Practitioner)     INTERVAL HISTORY: Thomas [name] is a 72 y.o. male with oncologic medical history including right iliac lytic lesion, right upper lob nodule (pending pathology), right paratracheal nodule, and right hilar and mediastinal adenopathy. He is currently undergoing radiation to right iliac and right rib. Pending additional oncology interventions based on pathology report.  Palliative ask to see for symptom management and goals of care.   SOCIAL HISTORY:     reports that he quit smoking about 28 years ago. His smoking use included cigarettes. He has a 6.00 pack-year smoking history. He has never used smokeless tobacco. He reports that he does not currently use alcohol. He reports that he does not use drugs.  ADVANCE DIRECTIVES:    CODE STATUS:   PAST MEDICAL HISTORY: Past Medical History:  Diagnosis Date   Abdominal wall pain 04/02/2021   Acute ischemic stroke ([place]) 03/22/2020   Avulsed toenail, initial encounter 05/04/2018   Dyspnea on exertion 02/10/2020   ED (erectile dysfunction)    Epidermoid cyst of neck 10/16/2017   Essential hypertension 07/21/2017   GERD (gastroesophageal reflux disease)    Gout of multiple sites 07/21/2017   History of kidney stones    History of radiation therapy    Right Chest, Right Pelvis- 10/30/22-11/13/22- Sr. 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  MEDICATIONS:  Current Outpatient Medications  Medication Sig Dispense Refill   acetaminophen (TYLENOL) 650 MG CR tablet Take 1,300 mg by mouth every 8 (eight) hours.     allopurinol (ZYLOPRIM) 300 MG tablet Take 300 mg by mouth in the morning.     amLODipine (NORVASC) 5 MG tablet Take 1 tablet (5 mg total) by mouth daily. (Patient taking differently: Take 5 mg by mouth in the morning.) 190 tablet 3   amoxicillin-clavulanate (AUGMENTIN) 875-125 MG tablet Take 1 tablet by mouth every 12 (twelve) hours for 10 days. 20 tablet 0   CLARITIN 10 MG tablet Take 10 mg by mouth in the morning.     diphenoxylate-atropine (LOMOTIL) 2.5-0.025 MG tablet Take 1 tablet by mouth every 4 (four) hours as needed for diarrhea or loose stools. Take 2 tablets after the first stool, then take as directed. (Patient not taking: Reported on 12/12/2022) 60 tablet 0   fentaNYL (DURAGESIC) 50 MCG/HR Place 1 patch onto the skin every 3 days. 10 patch 0   folic acid (FOLVITE) 1 MG tablet Take 1 tablet (1 mg total) by mouth daily. 30 tablet 4   gabapentin (NEURONTIN) 300 MG capsule Take 600 mg by mouth  every evening.     HYDROmorphone (DILAUDID) 8 MG tablet Take 1 tablet (8 mg total) by mouth every 4 (four) hours as needed for severe pain. (Patient taking differently: Take 8 mg by mouth every 4 (four) hours.) 90 tablet 0   IMODIUM A-D 2 MG tablet Take 2 mg by mouth every 4 (four) hours as needed for diarrhea or loose stools.     minoxidil (LONITEN) 10 MG tablet Take 1-1.5 tablets (10-15 mg total) by mouth 2 (two) times daily. TAKE 1 TABLET IN THE MORNING AND TAKE 1/2 TABLET AT NIGHT  (Patient taking differently: Take 10 mg by mouth See admin instructions. Take 10 mg by mouth in the morning and evening) 135 tablet 1   ondansetron (ZOFRAN-ODT) 4 MG disintegrating tablet Take 1-2 tablets (4-8 mg total) by mouth every 6 (six) hours as needed for nausea or vomiting. 60 tablet 1   pantoprazole (PROTONIX) 40 MG tablet Take 1 tablet (40 mg total) by mouth 2 (two) times daily. (Patient taking differently: Take 40 mg by mouth 2 (two) times daily before a meal.) 60 tablet 2   polyethylene glycol (MIRALAX / GLYCOLAX) 17 g packet Take 17 g by mouth 2 (two) times daily. (Patient taking differently: Take 17 g by mouth 2 (two) times daily as needed for mild constipation.) 14 each 0   PRESCRIPTION MEDICATION Apply 1 Application topically See admin instructions. Compounded pain cream ([place])- apply to the feet twice a day as needed for pain     PRESCRIPTION MEDICATION CPAP- At bedtime     prochlorperazine (COMPAZINE) 10 MG tablet Take 1 tablet (10 mg total) by mouth every 6 (six) hours as needed for nausea or vomiting. 30 tablet 0   senna (SENOKOT) 8.6 MG TABS tablet Take 2 tablets (17.2 mg total) by mouth 2 (two) times daily. (Patient not taking: Reported on 12/12/2022) 120 tablet 6   valsartan (DIOVAN) 320 MG tablet TAKE 1 TABLET BY MOUTH EVERY DAY (Patient taking differently: Take 320 mg by mouth in the morning.) 90 tablet 3   No current facility-administered medications for this visit.    VITAL SIGNS: There were no vitals taken for this visit. There were no vitals filed for this visit.  Estimated body mass index is 27.83 kg/m as calculated from the following:   Height as of 12/12/22: 5\' 11"  (1.803 m).   Weight as of 12/12/22: 199 lb 8.3 oz (90.5 kg).   PERFORMANCE STATUS (ECOG) : 1 - Symptomatic but completely ambulatory  Assessment NAD, sitting in recliner position in chair RRR Normal breathing pattern AAO x3, mood appropriate   IMPRESSION:  Thomas [name] presents to  clinic today for symptom management follow-up. Wife is present. Patient is s/p recent hospitalization due to sepsis secondary to acute diverticulitis. Is recovering well. No acute distress noted. Ambulatory without assistive devices.   Thomas [name] reports appetite is slowly improving. Is taking zofran as needed for nausea. Per Dr. Laverle Hobby is being held today.   Occasional episodes of fatigue. Education provided on moving around every few hours and remaining as active as possible to support ongoing fatigue.   Neoplasm related pain  Thomas [name] is tolerating current pain regimen. Reports to Levada Dy, NP Student pain at worst is 8/10 and will decrease to 5/10.    We have reviewed current regimen: hydromorphone 8mg  every 4 hours as needed for breakthrough pain and fentanyl 50 mcg patch. Tolerating well. No adverse side effects. Pain is controlled when taking. He should also  be taking gabapentin 900 mg at bedtime. We previously attempted Cymbalta at lowest dose of 20 mg to further assist in pain management as well as mood. Unfortunately patient self-discontinued as he did not appreciate how it made him feel.  Detailed discussion with patient regarding frequency of prescription and prn usage. We discussed listening to his body and not focusing on the clock or timing to determine when he is due for medications. Emphasized hopes that his active treatment will also allow for better pain control and improvement. He and wife verbalized understanding.   He has been prescribed steroid pack per Oncology which will also provide additional support. No additional changes to be made at this time. I will be reluctant to make changes to fentanyl patch early on in treatment course as to not overdo medications. Hopeful with treatment symptoms will decrease allowing for necessary improvement.  We discussed managing all symptoms collectively.  We will continue to closely monitor pain and make adjustments as needed.  Again  hopeful that with ongoing treatment this will begin to improve as he is coming up on his second cycle.  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  Per reports from medical team previously he has a scheduled appointment for further consideration of nerve block.  I think this will be ideal in assisting his pain management and minimizing changes to oral medications given this is a chronic condition.  Thomas [name] also shares his struggle with insomnia over the past several days s/p treatment.  Advised hopefully this will resolve once symptoms began to resolve.  Will continue to closely monitor.  Patient has signed pain contract on file. Of note I have completed extensive pain review including past contractes and electronic documents from Dr. Wyline Mood ([facility]) and Dr. Warnell Forester [facility]). Thomas [name] has received nerve blocks in the past and was on a lengthy course of prednisone which he endorses some improvement for his pain at that time. 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   We will continue to closely monitor and make safe adjustments as needed.   I discussed the importance of continued conversation with family and their medical providers regarding overall plan of care and treatment options, ensuring decisions are within the context of the patients values and GOCs.  PLAN: Fentanyl 50 mcg patch.   Hydromorphone 8 mg as needed for breakthrough pain.  Gabapentin 900 mg at bedtime. Does not feel any relief when taking 300 mg twice daily in addition to night dose. Zofran 4-8 mg as needed for nausea   Education provided on pain regimen and frequency. We will continue to closely monitor and adjust medications as needed. I will plan to see patient back in 3-4 weeks in collaboration with his other oncology appointments.  We will plan to follow-up on Monday via phone for close symptom management.   Patient expressed understanding and  was in agreement with this plan. He also understands that He can call the clinic at any time with any questions, concerns, or complaints.      Any controlled substances utilized were prescribed in the context of palliative care. PDMP has been reviewed.    Time Total: 40 min   Visit consisted of counseling and education dealing with the complex and emotionally intense issues of symptom management and palliative care in the setting of serious and potentially life-threatening illness.Greater than 50%  of this time was spent counseling and coordinating care related to the above assessment  and plan.  Thomas [name], AGPCNP-BC  Palliative Medicine Team/[facility]

## 2022-12-25 NOTE — Telephone Encounter (Signed)
Signed referral faxed to [facility]

## 2022-12-25 NOTE — Progress Notes (Signed)
OK to treat with HR 120 per Dr Julien Nordmann

## 2022-12-25 NOTE — Progress Notes (Signed)
[place] Telephone:([phone]) [phone]   [phone]  OFFICE PROGRESS NOTE  Patient, No Pcp Per No address on file  DIAGNOSIS: Stage IV (T1b, N3, M1 C) non-small cell lung cancer, adenocarcinoma presented with right upper lobe lung nodule in addition to mediastinal and right hilar adenopathy as well as right-sided pleural metastatic disease with malignant right pleural effusion and multiple osseous metastatic lesions and single hypermetabolic retroperitoneal lymph node diagnosed in December 2023  Biomarker Findings Microsatellite status - MS-Stable Tumor Mutational Burden - 2 Muts/Mb Genomic Findings For a complete list of the genes assayed, please refer to the Appendix. KRAS D78E KEAP1 splice site [id number]>I MTAP loss CDKN2A/B CDKN2A loss, CDKN2B loss MDM4 S367L - subclonal? 7 Disease relevant genes with no reportable alterations: ALK, BRAF, EGFR, ERBB2, MET, RET, ROS1  PDL1 Expression: 0%  PRIOR THERAPY: Status post palliative radiotherapy to the metastatic disease in the right hip area under the care of Dr. Sondra Come  CURRENT THERAPY: Systemic chemotherapy with carboplatin for AUC of 5, Alimta 500 Mg/M2 and Keytruda 200 Mg IV every 3 weeks.  First dose December 03, 2022.  Stat post 1 cycle.  INTERVAL HISTORY: Thomas [name] 72 y.o. male returns to the clinic today for follow-up visit accompanied by his wife Thomas [name] and his daughter Thomas [name].  He is feeling a little bit better today but he has a rough time after the first cycle of his chemotherapy.  He started having more fatigue and weakness after the filgrastim injection followed by nausea and vomiting as well as diarrhea.  The patient was seen in the hospital for dehydration and persistent diarrhea and he had CT of the chest abdomen pelvis performed at that time that showed no evidence for pulmonary embolism and no evidence for interval progression of his disease but there was colonic diverticulosis with subtle  pericolonic stranding of the proximal sigmoid colon suggestive of mild acute uncomplicated diverticulitis.  He was treated with a course of antibiotics.  He is finishing this course today.  He denied having any current chest pain, shortness of breath and his diarrhea is better.  He has no chest pain, shortness of breath except with exertion.  He lost few pounds with the diarrhea.  He is here today for reevaluation before starting cycle #2.  He is interested in seeing a psychologist to help with his condition.  MEDICAL HISTORY: Past Medical History:  Diagnosis Date   Abdominal wall pain 04/02/2021   Acute ischemic stroke ([place]) 03/22/2020   Avulsed toenail, initial encounter 05/04/2018   Dyspnea on exertion 02/10/2020   ED (erectile dysfunction)    Epidermoid cyst of neck 10/16/2017   Essential hypertension 07/21/2017   GERD (gastroesophageal reflux disease)    Gout of multiple sites 07/21/2017   History of kidney stones    History of radiation therapy    Right Chest, Right Pelvis- 10/30/22-11/13/22- Sr. 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  MEDICATIONS:  Current Outpatient Medications  Medication Sig Dispense Refill   acetaminophen (TYLENOL) 650 MG CR tablet Take 1,300 mg by mouth every 8 (eight) hours.     allopurinol (ZYLOPRIM) 300 MG tablet Take 300 mg by mouth in the morning.     amLODipine (NORVASC) 5 MG tablet Take 1  tablet (5 mg total) by mouth daily. (Patient taking differently: Take 5 mg by mouth in the morning.) 190 tablet 3   amoxicillin-clavulanate (AUGMENTIN) 875-125 MG tablet Take 1 tablet by mouth every 12 (twelve) hours for 10 days. 20 tablet 0   CLARITIN 10 MG tablet Take 10 mg by mouth in the morning.     diphenoxylate-atropine (LOMOTIL) 2.5-0.025 MG tablet Take 1 tablet by mouth every 4 (four) hours as needed for diarrhea or loose stools. Take 2 tablets after the first stool, then take as directed. (Patient not taking: Reported on 12/12/2022) 60 tablet 0   fentaNYL (DURAGESIC) 50 MCG/HR Place 1 patch onto the skin every 3 days. 10 patch 0   folic acid (FOLVITE) 1 MG tablet Take 1 tablet (1 mg total) by mouth daily. 30 tablet 4   gabapentin (NEURONTIN) 300 MG capsule Take 600 mg by mouth every evening.     HYDROmorphone (DILAUDID) 8 MG tablet Take 1 tablet (8 mg total) by mouth every 4 (four) hours as needed for severe pain. (Patient taking differently: Take 8 mg by mouth every 4 (four) hours.) 90 tablet 0   IMODIUM A-D 2 MG tablet Take 2 mg by mouth every 4 (four) hours as needed for diarrhea or loose stools.     minoxidil (LONITEN) 10 MG tablet Take 1-1.5 tablets (10-15 mg total) by mouth 2 (two) times daily. TAKE 1 TABLET IN THE MORNING AND TAKE 1/2 TABLET AT NIGHT (Patient taking differently: Take 10 mg by mouth See admin instructions. Take 10 mg by mouth in the morning and evening) 135 tablet 1   ondansetron (ZOFRAN-ODT) 4 MG disintegrating tablet Take 1-2 tablets (4-8 mg total) by mouth every 6 (six) hours as needed for nausea or vomiting. 60 tablet 1   pantoprazole (PROTONIX) 40 MG tablet Take 1 tablet (40 mg total) by mouth 2 (two) times daily. (Patient taking differently: Take 40 mg by mouth 2 (two) times daily before a meal.) 60 tablet 2   polyethylene glycol (MIRALAX / GLYCOLAX) 17 g packet Take 17 g by mouth 2 (two) times daily. (Patient taking differently: Take 17 g by mouth 2 (two) times  daily as needed for mild constipation.) 14 each 0   PRESCRIPTION MEDICATION Apply 1 Application topically See admin instructions. Compounded pain cream ([place])- apply to the feet twice a day as needed for pain     PRESCRIPTION MEDICATION CPAP- At bedtime     prochlorperazine (COMPAZINE) 10 MG tablet Take 1 tablet (10 mg total) by mouth every 6 (six) hours as needed for nausea or vomiting. 30 tablet 0   senna (SENOKOT) 8.6 MG TABS tablet Take 2 tablets (17.2 mg total) by mouth 2 (two) times daily. (Patient not taking: Reported on 12/12/2022) 120 tablet 6   valsartan (DIOVAN) 320 MG tablet TAKE 1 TABLET BY MOUTH EVERY DAY (Patient taking differently: Take 320 mg by mouth in the morning.) 90 tablet 3   No current facility-administered medications for this visit.    SURGICAL HISTORY:  Past Surgical History:  Procedure Laterality Date   COLONOSCOPY WITH PROPOFOL  2017   CYSTOSCOPY/RETROGRADE/URETEROSCOPY/STONE  EXTRACTION WITH BASKET Left 04/16/2018   Procedure: CYSTOSCOPY/RETROGRADE/URETEROSCOPY/STONE EXTRACTION WITH BASKET/ HOLMIUM LASER LITHOTRIPSY/ STENT PLACEMENT;  Surgeon: Ardis Hughs, MD;  Location: [facility];  Service: Urology;  Laterality: Left;   HERNIA REPAIR Right 2022   inguinal   HOLMIUM LASER APPLICATION Left 41/66/0630   Procedure: HOLMIUM LASER APPLICATION;  Surgeon: Ardis Hughs, MD;  Location: [facility];  Service: Urology;  Laterality: Left;   TOTAL HIP ARTHROPLASTY Right 06/30/2022   Procedure: RIGHT TOTAL HIP ARTHROPLASTY ANTERIOR APPROACH;  Surgeon: Leandrew Koyanagi, MD;  Location: [facility];  Service: Orthopedics;  Laterality: Right;  3-C    REVIEW OF SYSTEMS:  Constitutional: positive for fatigue and weight loss Eyes: negative Ears, nose, mouth, throat, and face: negative Respiratory: positive for dyspnea on exertion Cardiovascular: negative Gastrointestinal: positive for  diarrhea Genitourinary:negative Integument/breast: negative Hematologic/lymphatic: negative Musculoskeletal:negative Neurological: negative Behavioral/Psych: positive for anxiety and depression Endocrine: negative Allergic/Immunologic: negative   PHYSICAL EXAMINATION: General appearance: alert, cooperative, fatigued, and no distress Head: Normocephalic, without obvious abnormality, atraumatic Neck: no adenopathy, no JVD, supple, symmetrical, trachea midline, and thyroid not enlarged, symmetric, no tenderness/mass/nodules Lymph nodes: Cervical, supraclavicular, and axillary nodes normal. Resp: clear to auscultation bilaterally Back: symmetric, no curvature. ROM normal. No CVA tenderness. Cardio: regular rate and rhythm, S1, S2 normal, no murmur, click, rub or gallop GI: soft, non-tender; bowel sounds normal; no masses,  no organomegaly Extremities: extremities normal, atraumatic, no cyanosis or edema Neurologic: Alert and oriented X 3, normal strength and tone. Normal symmetric reflexes. Normal coordination and gait  ECOG PERFORMANCE STATUS: 1 - Symptomatic but completely ambulatory  Blood pressure 135/65, pulse (!) 120, temperature 98.7 F (37.1 C), resp. rate 16, weight 192 lb 3.2 oz (87.2 kg), SpO2 99 %.  LABORATORY DATA: Lab Results  Component Value Date   WBC 5.7 12/25/2022   HGB 9.6 (L) 12/25/2022   HCT 30.0 (L) 12/25/2022   MCV 83.8 12/25/2022   PLT 370 12/25/2022      Chemistry      Component Value Date/Time   NA 137 12/18/2022 1329   NA 140 06/07/2021 0952   K 3.6 12/18/2022 1329   CL 101 12/18/2022 1329   CO2 28 12/18/2022 1329   BUN 13 12/18/2022 1329   BUN 17 06/07/2021 0952   CREATININE 0.97 12/18/2022 1329   CREATININE 0.92 11/14/2016 1652      Component Value Date/Time   CALCIUM 8.3 (L) 12/18/2022 1329   ALKPHOS 77 12/18/2022 1329   AST 24 12/18/2022 1329   ALT 23 12/18/2022 1329   BILITOT 0.4 12/18/2022 1329       RADIOGRAPHIC STUDIES: CT  Angio Chest PE W and/or Wo Contrast  Result Date: 12/12/2022 CLINICAL DATA:  Pulmonary embolism (PE) suspected, high prob; RLQ abdominal pain n/v/d, abd pain RLQ, lung ca. History of lung cancer on chemotherapy EXAM: CT ANGIOGRAPHY CHEST CT ABDOMEN AND PELVIS WITH CONTRAST TECHNIQUE: Multidetector CT imaging of the chest was performed using the standard protocol during bolus administration of intravenous contrast. Multiplanar CT image reconstructions and MIPs were obtained to evaluate the vascular anatomy. Multidetector CT imaging of the abdomen and pelvis was performed using the standard protocol during bolus administration of intravenous contrast. RADIATION DOSE REDUCTION: This exam was performed according to the departmental dose-optimization program which includes automated exposure control, adjustment of the mA and/or kV according to patient size and/or use of iterative reconstruction technique. CONTRAST:  21mL OMNIPAQUE IOHEXOL 350 MG/ML SOLN COMPARISON:  CT 10/28/2022,  PET-CT 11/20/2022 FINDINGS: CTA CHEST FINDINGS Cardiovascular: Suboptimal evaluation of the pulmonary arteries secondary to contrast bolus timing, motion artifact, as well as beam hardening artifact secondary to patient's arms positioned against the body. Within this limitation, there is no evidence of a large or central pulmonary embolism. There is tumor encasement of the right upper lobe pulmonary artery which is severely narrowed (series 8, image 79). Heart size is within normal limits. No pericardial effusion. Thoracic aorta is nonaneurysmal. Atherosclerotic calcifications of the aorta and coronary arteries. Mediastinum/Nodes: Metastatic right paratracheal and right hilar lymphadenopathy, not appreciably changed in size from the recent PET-CT. No axillary or left hilar lymphadenopathy. Thyroid, trachea, and esophagus within normal limits. Lungs/Pleura: 1.6 cm pleural based pulmonary nodule in the medial right upper lobe, unchanged (series  7, image 55). Small right pleural effusion with areas of pleural metastatic disease at the lung bases. Left lung remains clear. No pneumothorax. Musculoskeletal: Erosive changes of the posterior right ninth rib, similar. No pathologic fracture. No new lytic or sclerotic bone lesions are identified. Review of the MIP images confirms the above findings. CT ABDOMEN and PELVIS FINDINGS Hepatobiliary: No new liver abnormality or evidence of hepatic metastatic disease. Moderately dilated gallbladder without hyperdense gallstone or pericholecystic inflammatory changes. No biliary dilatation. Pancreas: Unremarkable. No pancreatic ductal dilatation or surrounding inflammatory changes. Spleen: Normal in size without focal abnormality. Adrenals/Urinary Tract: No adrenal nodule. No solid renal mass. No stone or hydronephrosis. Urinary bladder is obscured by metal artifact from patient's hip prosthesis. Stomach/Bowel: Stomach within normal limits. No abnormally dilated loops of bowel. Colonic diverticulosis with subtle pericolonic stranding of the proximal sigmoid colon (series 3, image 79). No pericolonic fluid collection or extraluminal air. Vascular/Lymphatic: Aortic atherosclerosis. Nonenlarged celiac chain lymph nodes, noted to be hypermetabolic on PET-CT (series 3, image 32). No new or enlarging abdominopelvic lymph nodes. Reproductive: Prostate largely obscured by streak artifact. Other: No free fluid. No abdominopelvic fluid collection. No pneumoperitoneum. Tiny fat containing umbilical hernia. Musculoskeletal: Destructive mass of the posterior right iliac bone with extraosseous soft tissue component (series 3, image 65), not significantly changed. No new bone lesion. Review of the MIP images confirms the above findings. IMPRESSION: 1. Suboptimal evaluation of the pulmonary arteries. See above discussion. Within this limitation, there is no evidence of a large or central pulmonary embolism. There is tumor encasement of  the right upper lobe pulmonary artery which is severely narrowed. 2. No significant interval progression of known metastatic lung cancer including right upper lobe lung lesion, mediastinal and right hilar lymphadenopathy, retroperitoneal lymph node involvement, as well as osseous metastatic involvement of the right ninth rib and right iliac bone. 3. Colonic diverticulosis with subtle pericolonic stranding of the proximal sigmoid colon, suggestive of mild acute uncomplicated diverticulitis. 4. Aortic atherosclerosis (ICD10-I70.0). Electronically Signed   By: Davina Poke D.O.   On: 12/12/2022 14:13   CT ABDOMEN PELVIS W CONTRAST  Result Date: 12/12/2022 CLINICAL DATA:  Pulmonary embolism (PE) suspected, high prob; RLQ abdominal pain n/v/d, abd pain RLQ, lung ca. History of lung cancer on chemotherapy EXAM: CT ANGIOGRAPHY CHEST CT ABDOMEN AND PELVIS WITH CONTRAST TECHNIQUE: Multidetector CT imaging of the chest was performed using the standard protocol during bolus administration of intravenous contrast. Multiplanar CT image reconstructions and MIPs were obtained to evaluate the vascular anatomy. Multidetector CT imaging of the abdomen and pelvis was performed using the standard protocol during bolus administration of intravenous contrast. RADIATION DOSE REDUCTION: This exam was performed according to the departmental dose-optimization program which  includes automated exposure control, adjustment of the mA and/or kV according to patient size and/or use of iterative reconstruction technique. CONTRAST:  55mL OMNIPAQUE IOHEXOL 350 MG/ML SOLN COMPARISON:  CT 10/28/2022, PET-CT 11/20/2022 FINDINGS: CTA CHEST FINDINGS Cardiovascular: Suboptimal evaluation of the pulmonary arteries secondary to contrast bolus timing, motion artifact, as well as beam hardening artifact secondary to patient's arms positioned against the body. Within this limitation, there is no evidence of a large or central pulmonary embolism. There  is tumor encasement of the right upper lobe pulmonary artery which is severely narrowed (series 8, image 79). Heart size is within normal limits. No pericardial effusion. Thoracic aorta is nonaneurysmal. Atherosclerotic calcifications of the aorta and coronary arteries. Mediastinum/Nodes: Metastatic right paratracheal and right hilar lymphadenopathy, not appreciably changed in size from the recent PET-CT. No axillary or left hilar lymphadenopathy. Thyroid, trachea, and esophagus within normal limits. Lungs/Pleura: 1.6 cm pleural based pulmonary nodule in the medial right upper lobe, unchanged (series 7, image 55). Small right pleural effusion with areas of pleural metastatic disease at the lung bases. Left lung remains clear. No pneumothorax. Musculoskeletal: Erosive changes of the posterior right ninth rib, similar. No pathologic fracture. No new lytic or sclerotic bone lesions are identified. Review of the MIP images confirms the above findings. CT ABDOMEN and PELVIS FINDINGS Hepatobiliary: No new liver abnormality or evidence of hepatic metastatic disease. Moderately dilated gallbladder without hyperdense gallstone or pericholecystic inflammatory changes. No biliary dilatation. Pancreas: Unremarkable. No pancreatic ductal dilatation or surrounding inflammatory changes. Spleen: Normal in size without focal abnormality. Adrenals/Urinary Tract: No adrenal nodule. No solid renal mass. No stone or hydronephrosis. Urinary bladder is obscured by metal artifact from patient's hip prosthesis. Stomach/Bowel: Stomach within normal limits. No abnormally dilated loops of bowel. Colonic diverticulosis with subtle pericolonic stranding of the proximal sigmoid colon (series 3, image 79). No pericolonic fluid collection or extraluminal air. Vascular/Lymphatic: Aortic atherosclerosis. Nonenlarged celiac chain lymph nodes, noted to be hypermetabolic on PET-CT (series 3, image 32). No new or enlarging abdominopelvic lymph nodes.  Reproductive: Prostate largely obscured by streak artifact. Other: No free fluid. No abdominopelvic fluid collection. No pneumoperitoneum. Tiny fat containing umbilical hernia. Musculoskeletal: Destructive mass of the posterior right iliac bone with extraosseous soft tissue component (series 3, image 65), not significantly changed. No new bone lesion. Review of the MIP images confirms the above findings. IMPRESSION: 1. Suboptimal evaluation of the pulmonary arteries. See above discussion. Within this limitation, there is no evidence of a large or central pulmonary embolism. There is tumor encasement of the right upper lobe pulmonary artery which is severely narrowed. 2. No significant interval progression of known metastatic lung cancer including right upper lobe lung lesion, mediastinal and right hilar lymphadenopathy, retroperitoneal lymph node involvement, as well as osseous metastatic involvement of the right ninth rib and right iliac bone. 3. Colonic diverticulosis with subtle pericolonic stranding of the proximal sigmoid colon, suggestive of mild acute uncomplicated diverticulitis. 4. Aortic atherosclerosis (ICD10-I70.0). Electronically Signed   By: Davina Poke D.O.   On: 12/12/2022 14:13   CT Head Wo Contrast  Result Date: 12/12/2022 CLINICAL DATA:  Headache. EXAM: CT HEAD WITHOUT CONTRAST TECHNIQUE: Contiguous axial images were obtained from the base of the skull through the vertex without intravenous contrast. RADIATION DOSE REDUCTION: This exam was performed according to the departmental dose-optimization program which includes automated exposure control, adjustment of the mA and/or kV according to patient size and/or use of iterative reconstruction technique. COMPARISON:  MRI head 10/29/2022. FINDINGS: Brain: No  evidence of acute infarction, hemorrhage, hydrocephalus, extra-axial collection or mass lesion/mass effect. Patchy white matter hypodensities, nonspecific but compatible with chronic  microvascular ischemic disease. Vascular: No hyperdense vessel identified. Skull: No acute fracture. Sinuses/Orbits: Largely clear sinuses.  No acute orbital findings. Other: No mastoid effusions. IMPRESSION: No evidence of acute intracranial abnormality. Electronically Signed   By: Margaretha Sheffield M.D.   On: 12/12/2022 14:02   DG Chest Port 1 View  Result Date: 12/12/2022 CLINICAL DATA:  Fever, hypotension and diarrhea. Started chemotherapy yesterday for stage IV lung cancer. EXAM: PORTABLE CHEST 1 VIEW COMPARISON:  03/22/2020.  Chest CTA dated 11/08/2022 FINDINGS: Normal sized heart. Mildly tortuous and mildly calcified thoracic aorta. Ill-defined increased density at the medial aspect of the right upper lobe compatible with the patient's known malignancy and associated metastatic adenopathy seen on the chest CTA. Additional small nodular density in the lateral aspect of the right upper lung zone with no corresponding abnormality on the previous CT. Possible small nodular density in the lateral aspect of the left upper lung zone in area of overlapping rib and scapula, not previously seen. Otherwise, clear lungs. No pleural fluid. Thoracic spine degenerative changes. IMPRESSION: 1. No acute abnormality. 2. Ill-defined increased density at the medial aspect of the right upper lobe compatible with the patient's known malignancy and associated metastatic adenopathy seen on the previous chest CTA. 3. Possible interval small nodule in the lateral aspect of each upper lung zone, potentially representing small metastases. Electronically Signed   By: Claudie Revering M.D.   On: 12/12/2022 12:14    ASSESSMENT AND PLAN: This is a very pleasant 71 years old white male with Stage IV (T1b, N3, M1 C) non-small cell lung cancer, adenocarcinoma presented with right upper lobe lung nodule in addition to mediastinal and right hilar adenopathy as well as right-sided pleural metastatic disease with malignant right pleural effusion  and multiple osseous metastatic lesions and single hypermetabolic retroperitoneal lymph node diagnosed in December 2023. Molecular studies by foundation 1 showed positive KRAS G12C mutation and negative PD-L1 expression The patient is currently undergoing systemic chemotherapy with carboplatin for AUC of 5, Alimta 500 Mg/M2 and Keytruda 200 Mg IV every 3 weeks status post 1 cycle.  His first cycle of the treatment was complicated with increasing fatigue and weakness as well as dehydration secondary to diarrhea from suspicious acute and complicated diverticulitis.  He was treated with a course of antibiotics and he is feeling better. I had a lengthy discussion with the patient and his family today about his current condition and treatment options. I gave the patient the option of proceeding with his treatment today but holding Keytruda during this cycle because of the recent diverticulitis versus delaying his treatment until improvement of his condition versus consideration of second line treatment with targeted therapy but the patient did not fail the first-line therapy with this regimen. After a lengthy discussion of all the options, the patient would like to proceed with his treatment today but will proceed with only carboplatin and Alimta.  We will resume his treatment with Keytruda starting from the next cycle if his condition is better. For the dehydration and tachycardia, I will arrange for the patient to receive 1 L of normal saline during his treatment today and I will also arrange for him to receive another liter of normal saline on Saturday (in 2 days). For the depression and coping issue, I will refer the patient to psychology for evaluation and management of his condition based on  his request. For the lack of appetite and fatigue, I will start the patient on Medrol Dosepak. The patient also mentioned that he is not currently happy with his primary care provider and I will refer him to one of the  [place] primary care provider for management of his condition. For the IV access, I will arrange for the patient to have a Port-A-Cath placed and I will call his pharmacy with prescription for Emla cream to be applied to the Port-A-Cath site before treatment. The patient will come back for follow-up visit in 3 weeks for evaluation before starting cycle #3. He was advised to call immediately if he has any other concerning symptoms in the interval. For the pain management he will continue with his current pain medication and he is followed by the palliative care team at the cancer center and he will need adjustment of his medication.   The patient voices understanding of current disease status and treatment options and is in agreement with the current care plan.  All questions were answered. The patient knows to call the clinic with any problems, questions or concerns. We can certainly see the patient much sooner if necessary.  The total time spent in the appointment was 55 minutes.  Disclaimer: This note was dictated with voice recognition software. Similar sounding words can inadvertently be transcribed and may not be corrected upon review.

## 2022-12-26 ENCOUNTER — Telehealth: Payer: Self-pay | Admitting: Internal Medicine

## 2022-12-26 ENCOUNTER — Telehealth: Payer: Self-pay

## 2022-12-26 ENCOUNTER — Encounter: Payer: Self-pay | Admitting: Internal Medicine

## 2022-12-26 NOTE — Telephone Encounter (Signed)
This nurse received a message from [place] with [facility].  She stated that she received a referral for this patient but unfortunately she could not accept it at this time due to lack of staff in the [place] area.  No further questions or concerns noted at this time.

## 2022-12-26 NOTE — Telephone Encounter (Signed)
Scheduled per 02/08 los, patient has been called and notified of upcoming appointments.

## 2022-12-27 ENCOUNTER — Other Ambulatory Visit: Payer: Self-pay

## 2022-12-27 ENCOUNTER — Inpatient Hospital Stay: Payer: Medicare Other

## 2022-12-27 VITALS — BP 146/59 | HR 95 | Temp 97.5°F | Resp 18

## 2022-12-27 DIAGNOSIS — C3411 Malignant neoplasm of upper lobe, right bronchus or lung: Secondary | ICD-10-CM | POA: Diagnosis not present

## 2022-12-27 DIAGNOSIS — E860 Dehydration: Secondary | ICD-10-CM

## 2022-12-27 DIAGNOSIS — C3491 Malignant neoplasm of unspecified part of right bronchus or lung: Secondary | ICD-10-CM

## 2022-12-27 DIAGNOSIS — D709 Neutropenia, unspecified: Secondary | ICD-10-CM

## 2022-12-27 MED ORDER — SODIUM CHLORIDE 0.9 % IV SOLN: Freq: Once | INTRAVENOUS | Status: AC

## 2022-12-27 MED ORDER — ONDANSETRON HCL 4 MG/2ML IJ SOLN: 8.0000 mg | Freq: Once | INTRAMUSCULAR | Status: DC

## 2022-12-27 NOTE — Progress Notes (Signed)
Patient reports diarrhea x15 last night. Patient states he is eating/drinking and that he has taken antidiarrheals with some relief. Following IVF patient states he feels much better and that stools are becoming "less watery." In basket sent regarding complaints to provider. Patient educated to go to ED for worsening symptoms. Patient verbalized understanding.

## 2022-12-27 NOTE — Patient Instructions (Signed)

## 2022-12-29 ENCOUNTER — Telehealth: Payer: Self-pay

## 2022-12-29 NOTE — Telephone Encounter (Signed)
RN left voicemail for patient regarding staff message from the RN caring for the patient on Saturday. This RN called to check in on patient to see how his symptoms are and if he feels the need to come in for IV fluids today. Encouraged patient to call [facility] back if he wishes to come in.

## 2022-12-30 ENCOUNTER — Other Ambulatory Visit: Payer: Self-pay | Admitting: Radiology

## 2022-12-30 NOTE — H&P (Incomplete)
Referring Physician(s): Mohamed,Mohamed  Supervising Physician: Sandi Mariscal  Patient Status:  [facility] OP  Chief Complaint: "I'm here for a port a cath"   Subjective: Pt known to IR team from right iliac bone lesion bx on 10/30/22. He is a 72 year old white male with Stage IV (T1b, N3, M1 C) non-small cell lung cancer, adenocarcinoma , who presented with right upper lobe lung nodule in addition to mediastinal and right hilar adenopathy as well as right-sided pleural metastatic disease with malignant right pleural effusion and multiple osseous metastatic lesions and single hypermetabolic retroperitoneal lymph node diagnosed in December 2023. He presents today for port a cath placement to assist with treatment. Additional med hx as below.     Past Medical History:  Diagnosis Date   Abdominal wall pain 04/02/2021   Acute ischemic stroke ([place]) 03/22/2020   Avulsed toenail, initial encounter 05/04/2018   Dyspnea on exertion 02/10/2020   ED (erectile dysfunction)    Epidermoid cyst of neck 10/16/2017   Essential hypertension 07/21/2017   GERD (gastroesophageal reflux disease)    Gout of multiple sites 07/21/2017   History of kidney stones    History of radiation therapy    Right Chest, Right Pelvis- 10/30/22-11/13/22- Sr. 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   Urgency of urination    URI (upper respiratory infection) 11/08/2014   Wears glasses    Past Surgical History:  Procedure Laterality Date   COLONOSCOPY WITH PROPOFOL  2017    CYSTOSCOPY/RETROGRADE/URETEROSCOPY/STONE EXTRACTION WITH BASKET Left 04/16/2018   Procedure: CYSTOSCOPY/RETROGRADE/URETEROSCOPY/STONE EXTRACTION WITH BASKET/ HOLMIUM LASER LITHOTRIPSY/ STENT PLACEMENT;  Surgeon: Ardis Hughs, MD;  Location: [facility];  Service: Urology;  Laterality: Left;   HERNIA REPAIR Right 2022   inguinal   HOLMIUM LASER APPLICATION Left 29/92/4268   Procedure: HOLMIUM LASER APPLICATION;  Surgeon: Ardis Hughs, MD;  Location: [facility];  Service: Urology;  Laterality: Left;   TOTAL HIP ARTHROPLASTY Right 06/30/2022   Procedure: RIGHT TOTAL HIP ARTHROPLASTY ANTERIOR APPROACH;  Surgeon: Leandrew Koyanagi, MD;  Location: [facility];  Service: Orthopedics;  Laterality: Right;  3-C      Allergies: Statins  Medications: Prior to Admission medications   Medication Sig Start Date End Date Taking? Authorizing Provider  acetaminophen (TYLENOL) 650 MG CR tablet Take 1,300 mg by mouth every 8 (eight) hours.    [provider]  allopurinol (ZYLOPRIM) 300 MG tablet Take 300 mg by mouth in the morning. 05/14/20   [provider]  amLODipine (NORVASC) 5 MG tablet Take 1 tablet (5 mg total) by mouth daily. Patient taking differently: Take 5 mg by mouth in the morning. 06/06/22   Richardson Dopp T, PA-C  CLARITIN 10 MG tablet Take 10 mg by mouth in the morning.    [provider]  diphenoxylate-atropine (LOMOTIL) 2.5-0.025 MG tablet Take 1 tablet by mouth every 4 (four) hours as needed for diarrhea or loose stools. Take 2 tablets after the first stool, then take as directed. Patient not taking: Reported  on 12/12/2022 12/12/22   Pickenpack-Cousar, Carlena Sax, NP  fentaNYL (DURAGESIC) 50 MCG/HR Place 1 patch onto the skin every 3 days. 12/12/22   Pickenpack-Cousar, Carlena Sax, NP  folic acid (FOLVITE) 1 MG tablet Take 1 tablet (1 mg total) by mouth daily. 11/26/22   Curt Bears, MD  gabapentin (NEURONTIN) 300 MG capsule Take 600 mg  by mouth every evening.    [provider]  HYDROmorphone (DILAUDID) 8 MG tablet Take 1 tablet (8 mg total) by mouth every 4 (four) hours as needed for severe pain. 12/25/22   Pickenpack-Cousar, Carlena Sax, NP  IMODIUM A-D 2 MG tablet Take 2 mg by mouth every 4 (four) hours as needed for diarrhea or loose stools.    [provider]  lidocaine-prilocaine (EMLA) cream Apply to the Port-A-Cath site 30-60 minutes before treatment 12/25/22   Curt Bears, MD  methylPREDNISolone (MEDROL DOSEPAK) 4 MG TBPK tablet Use as instructed 12/25/22   Curt Bears, MD  minoxidil (LONITEN) 10 MG tablet Take 1-1.5 tablets (10-15 mg total) by mouth 2 (two) times daily. TAKE 1 TABLET IN THE MORNING AND TAKE 1/2 TABLET AT NIGHT Patient taking differently: Take 10 mg by mouth See admin instructions. Take 10 mg by mouth in the morning and evening 09/12/22   Park Liter, MD  ondansetron (ZOFRAN-ODT) 4 MG disintegrating tablet Take 1-2 tablets (4-8 mg total) by mouth every 6 (six) hours as needed for nausea or vomiting. 11/12/22   Pickenpack-Cousar, Carlena Sax, NP  pantoprazole (PROTONIX) 40 MG tablet Take 1 tablet (40 mg total) by mouth 2 (two) times daily. Patient taking differently: Take 40 mg by mouth 2 (two) times daily before a meal. 11/26/22   Pickenpack-Cousar, Carlena Sax, NP  polyethylene glycol (MIRALAX / GLYCOLAX) 17 g packet Take 17 g by mouth 2 (two) times daily. Patient taking differently: Take 17 g by mouth 2 (two) times daily as needed for mild constipation. 11/02/22   Antonieta Pert, MD  PRESCRIPTION MEDICATION Apply 1 Application topically See admin instructions. Compounded pain cream ([place])- apply to the feet twice a day as needed for pain    [provider]  PRESCRIPTION MEDICATION CPAP- At bedtime    [provider]  prochlorperazine (COMPAZINE) 10 MG tablet Take 1 tablet (10 mg total) by mouth every 6 (six) hours as needed for nausea or vomiting. 12/03/22    Curt Bears, MD  senna (SENOKOT) 8.6 MG TABS tablet Take 2 tablets (17.2 mg total) by mouth 2 (two) times daily. Patient not taking: Reported on 12/12/2022 11/27/22   Pickenpack-Cousar, Carlena Sax, NP  valsartan (DIOVAN) 320 MG tablet TAKE 1 TABLET BY MOUTH EVERY DAY Patient taking differently: Take 320 mg by mouth in the morning. 08/20/22   Park Liter, MD     Vital Signs:  Code Status:    Physical Exam  Imaging: No results found.  Labs:  CBC: Recent Labs    12/14/22 0535 12/15/22 0452 12/18/22 1329 12/25/22 0821  WBC 9.4 6.1 7.1 5.7  HGB 10.1* 9.7* 10.4* 9.6*  HCT 32.2* 30.4* 32.2* 30.0*  PLT 133* 138* 208 370    COAGS: Recent Labs    12/12/22 1220  INR 1.2  APTT 35    BMP: Recent Labs    12/13/22 0610 12/14/22 0535 12/18/22 1329 12/25/22 0821  NA 137 139 137 137  K 3.8 3.7 3.6 3.9  CL 103 101 101 101  CO2 25 27 28 28   GLUCOSE 86 91 112* 115*  BUN 30* 14 13 19   CALCIUM 7.9* 8.3* 8.3* 8.9  CREATININE 1.43* 1.13 0.97 1.19  GFRNONAA 53* >60 >60 >60    LIVER FUNCTION TESTS: Recent Labs    12/13/22 0610 12/14/22 0535 12/18/22 1329 12/25/22 0821  BILITOT 0.6 0.7 0.4 0.4  AST 19 24 24 22   ALT 29 29 23 19   ALKPHOS 56 73 77 73  PROT 5.1* 6.0* 6.5 6.1*  ALBUMIN 2.5* 3.0* 3.5 3.4*    Assessment and Plan: Pt known to IR team from right iliac bone lesion bx on 10/30/22. He is a 72 year old white male with Stage IV (T1b, N3, M1 C) non-small cell lung cancer, adenocarcinoma , who presented with right upper lobe lung nodule in addition to mediastinal and right hilar adenopathy as well as right-sided pleural metastatic disease with malignant right pleural effusion and multiple osseous metastatic lesions and single hypermetabolic retroperitoneal lymph node diagnosed in December 2023. He presents today for port a cath placement to assist with treatment. Additional med hx includes prior CVA, ED, HTN, GERD, gout, renal stones, HLD, SMA stenosis. Risks  and benefits of image guided port-a-catheter placement was discussed with the patient including, but not limited to bleeding, infection, pneumothorax, or fibrin sheath development and need for additional procedures.  All of the patient's questions were answered, patient is agreeable to proceed. Consent signed and in chart.    Electronically Signed: D. Rowe Robert, PA-C 12/30/2022, 3:46 PM   I spent a total of 25 minutes at the the patient's bedside AND on the patient's hospital floor or unit, greater than 50% of which was counseling/coordinating care for port a cath placement

## 2022-12-31 ENCOUNTER — Telehealth: Payer: Self-pay

## 2022-12-31 ENCOUNTER — Encounter (HOSPITAL_COMMUNITY): Payer: Self-pay

## 2022-12-31 ENCOUNTER — Other Ambulatory Visit: Payer: Self-pay | Admitting: Medical Oncology

## 2022-12-31 ENCOUNTER — Other Ambulatory Visit: Payer: Self-pay | Admitting: Internal Medicine

## 2022-12-31 ENCOUNTER — Inpatient Hospital Stay (HOSPITAL_COMMUNITY): Admission: RE | Admit: 2022-12-31 | Payer: Medicare Other | Source: Ambulatory Visit

## 2022-12-31 ENCOUNTER — Telehealth: Payer: Self-pay | Admitting: Medical Oncology

## 2022-12-31 ENCOUNTER — Inpatient Hospital Stay: Payer: Medicare Other

## 2022-12-31 ENCOUNTER — Ambulatory Visit (HOSPITAL_COMMUNITY): Payer: Medicare Other

## 2022-12-31 VITALS — BP 145/59 | HR 105 | Temp 98.1°F | Resp 18

## 2022-12-31 DIAGNOSIS — Z5111 Encounter for antineoplastic chemotherapy: Secondary | ICD-10-CM

## 2022-12-31 DIAGNOSIS — R110 Nausea: Secondary | ICD-10-CM

## 2022-12-31 DIAGNOSIS — C3411 Malignant neoplasm of upper lobe, right bronchus or lung: Secondary | ICD-10-CM | POA: Diagnosis not present

## 2022-12-31 DIAGNOSIS — D709 Neutropenia, unspecified: Secondary | ICD-10-CM

## 2022-12-31 DIAGNOSIS — E860 Dehydration: Secondary | ICD-10-CM

## 2022-12-31 LAB — CMP (CANCER CENTER ONLY)
ALT: 19 U/L (ref 0–44)
AST: 21 U/L (ref 15–41)
Albumin: 3.4 g/dL — ABNORMAL LOW (ref 3.5–5.0)
Alkaline Phosphatase: 79 U/L (ref 38–126)
Anion gap: 11 (ref 5–15)
BUN: 20 mg/dL (ref 8–23)
CO2: 26 mmol/L (ref 22–32)
Calcium: 8.4 mg/dL — ABNORMAL LOW (ref 8.9–10.3)
Chloride: 96 mmol/L — ABNORMAL LOW (ref 98–111)
Creatinine: 0.97 mg/dL (ref 0.61–1.24)
GFR, Estimated: >60 mL/min (ref >60)
Glucose, Bld: 121 mg/dL — ABNORMAL HIGH (ref 70–99)
Potassium: 3.9 mmol/L (ref 3.5–5.1)
Sodium: 133 mmol/L — ABNORMAL LOW (ref 135–145)
Total Bilirubin: 0.6 mg/dL (ref 0.3–1.2)
Total Protein: 7 g/dL (ref 6.5–8.1)

## 2022-12-31 LAB — CBC WITH DIFFERENTIAL (CANCER CENTER ONLY)
Abs Immature Granulocytes: 0.01 10*3/uL (ref 0.00–0.07)
Basophils Absolute: 0 10*3/uL (ref 0.0–0.1)
Basophils Relative: 0 %
Eosinophils Absolute: 0 10*3/uL (ref 0.0–0.5)
Eosinophils Relative: 2 %
HCT: 29.5 % — ABNORMAL LOW (ref 39.0–52.0)
Hemoglobin: 9.6 g/dL — ABNORMAL LOW (ref 13.0–17.0)
Immature Granulocytes: 2 %
Lymphocytes Relative: 31 %
Lymphs Abs: 0.2 10*3/uL — ABNORMAL LOW (ref 0.7–4.0)
MCH: 26.5 pg (ref 26.0–34.0)
MCHC: 32.5 g/dL (ref 30.0–36.0)
MCV: 81.5 fL (ref 80.0–100.0)
Monocytes Absolute: 0 10*3/uL — ABNORMAL LOW (ref 0.1–1.0)
Monocytes Relative: 7 %
Neutro Abs: 0.3 10*3/uL — CL (ref 1.7–7.7)
Neutrophils Relative %: 58 %
Platelet Count: 295 10*3/uL (ref 150–400)
RBC: 3.62 MIL/uL — ABNORMAL LOW (ref 4.22–5.81)
RDW: 15.9 % — ABNORMAL HIGH (ref 11.5–15.5)
Smear Review: NORMAL
WBC Count: 0.6 10*3/uL — CL (ref 4.0–10.5)
nRBC: 0 % (ref 0.0–0.2)

## 2022-12-31 LAB — SAMPLE TO BLOOD BANK

## 2022-12-31 MED ORDER — FILGRASTIM-SNDZ 480 MCG/0.8ML IJ SOSY
480.0000 ug | PREFILLED_SYRINGE | Freq: Once | INTRAMUSCULAR | Status: AC
  Administered 2022-12-31: 480 ug via SUBCUTANEOUS
  Filled 2022-12-31: qty 0.8

## 2022-12-31 MED ORDER — SODIUM CHLORIDE 0.9 % IV SOLN: Freq: Once | INTRAVENOUS | Status: AC

## 2022-12-31 MED ORDER — ONDANSETRON HCL 4 MG/2ML IJ SOLN
8.0000 mg | Freq: Once | INTRAMUSCULAR | Status: AC
  Administered 2022-12-31: 8 mg via INTRAVENOUS
  Filled 2022-12-31: qty 4

## 2022-12-31 MED ORDER — SODIUM CHLORIDE 0.9 % IV SOLN: 8.0000 mg | Freq: Once | INTRAVENOUS | Status: DC

## 2022-12-31 NOTE — Telephone Encounter (Signed)
CRITICAL VALUE STICKER  CRITICAL VALUE:WBC=0.6 and ANC=0.4  RECEIVER (on-site recipient of call):Abbygale Lapid, RN  DATE & TIME NOTIFIED: 12/31/22 @ 1400  MESSENGER (representative from lab):  MD NOTIFIED: Mohamed  TIME OF NOTIFICATION: 1410 RESPONSE:  GCSF ordered.

## 2022-12-31 NOTE — Telephone Encounter (Signed)
CRITICAL VALUE STICKER  CRITICAL VALUE:  WBC 0.6            ANC 0.3  RECEIVER (on-site recipient of call): Stephan Nelis P. LPN  DATE & TIME NOTIFIED: 12/31/22 2:40 pm  MESSENGER (representative from lab): Rosann Auerbach   MD NOTIFIED: Dr. Julien Nordmann

## 2022-12-31 NOTE — Patient Instructions (Signed)
Filgrastim Injection What is this medication? FILGRASTIM (fil GRA stim) lowers the risk of infection in people who are receiving chemotherapy. It works by Building control surveyor make more white blood cells, which protects your body from infection. It may also be used to help people who have been exposed to high doses of radiation. It can be used to help prepare your body before a stem cell transplant. It works by helping your bone marrow make and release stem cells into the blood. This medicine may be used for other purposes; ask your health care provider or pharmacist if you have questions. COMMON BRAND NAME(S): Neupogen, Nivestym, Releuko, Zarxio What should I tell my care team before I take this medication? They need to know if you have any of these conditions: History of blood diseases, such as sickle cell anemia Kidney disease Recent or ongoing radiation An unusual or allergic reaction to filgrastim, pegfilgrastim, latex, rubber, other medications, foods, dyes, or preservatives Pregnant or trying to get pregnant Breast-feeding How should I use this medication? This medication is injected under the skin or into a vein. It is usually given by your care team in a hospital or clinic setting. It may be given at home. If you get this medication at home, you will be taught how to prepare and give it. Use exactly as directed. Take it as directed on the prescription label at the same time every day. Keep taking it unless your care team tells you to stop. It is important that you put your used needles and syringes in a special sharps container. Do not put them in a trash can. If you do not have a sharps container, call your pharmacist or care team to get one. This medication comes with INSTRUCTIONS FOR USE. Ask your pharmacist for directions on how to use this medication. Read the information carefully. Talk to your pharmacist or care team if you have questions. Talk to your care team about the use of this  medication in children. While it may be prescribed for children for selected conditions, precautions do apply. Overdosage: If you think you have taken too much of this medicine contact a poison control center or emergency room at once. NOTE: This medicine is only for you. Do not share this medicine with others. What if I miss a dose? It is important not to miss any doses. Talk to your care team about what to do if you miss a dose. What may interact with this medication? Medications that may cause a release of neutrophils, such as lithium This list may not describe all possible interactions. Give your health care provider a list of all the medicines, herbs, non-prescription drugs, or dietary supplements you use. Also tell them if you smoke, drink alcohol, or use illegal drugs. Some items may interact with your medicine. What should I watch for while using this medication? Your condition will be monitored carefully while you are receiving this medication. You may need bloodwork while taking this medication. Talk to your care team about your risk of cancer. You may be more at risk for certain types of cancer if you take this medication. What side effects may I notice from receiving this medication? Side effects that you should report to your care team as soon as possible: Allergic reactions--skin rash, itching, hives, swelling of the face, lips, tongue, or throat Capillary leak syndrome--stomach or muscle pain, unusual weakness or fatigue, feeling faint or lightheaded, decrease in the amount of urine, swelling of the ankles, hands, or  feet, trouble breathing High white blood cell level--fever, fatigue, trouble breathing, night sweats, change in vision, weight loss Inflammation of the aorta--fever, fatigue, back, chest, or stomach pain, severe headache Kidney injury (glomerulonephritis)--decrease in the amount of urine, red or dark brown urine, foamy or bubbly urine, swelling of the ankles, hands, or  feet Shortness of breath or trouble breathing Spleen injury--pain in upper left stomach or shoulder Unusual bruising or bleeding Side effects that usually do not require medical attention (report to your care team if they continue or are bothersome): Back pain Bone pain Fatigue Fever Headache Nausea This list may not describe all possible side effects. Call your doctor for medical advice about side effects. You may report side effects to FDA at 1-800-FDA-1088. Where should I keep my medication? Keep out of the reach of children and pets. Keep this medication in the original packaging until you are ready to take it. Protect from light. See product for storage information. Each product may have different instructions. Get rid of any unused medication after the expiration date. To get rid of medications that are no longer needed or have expired: Take the medication to a medications take-back program. Check with your pharmacy or law enforcement to find a location. If you cannot return the medication, ask your pharmacist or care team how to get rid of this medication safely. NOTE: This sheet is a summary. It may not cover all possible information. If you have questions about this medicine, talk to your doctor, pharmacist, or health care provider.  2023 [employer] (2022-02-11 00:00:00)

## 2022-12-31 NOTE — Telephone Encounter (Signed)
Appt  made for labs and IVF - "Nausea, lingering diarrhea, not eating, losing strength. " States he needs IVF. Per Dr. Julien Nordmann schedule for IVF and zofran. Pt notified.

## 2023-01-01 ENCOUNTER — Inpatient Hospital Stay: Payer: Medicare Other

## 2023-01-01 ENCOUNTER — Other Ambulatory Visit: Payer: Self-pay | Admitting: Medical Oncology

## 2023-01-01 ENCOUNTER — Other Ambulatory Visit: Payer: Self-pay | Admitting: Nurse Practitioner

## 2023-01-01 ENCOUNTER — Telehealth: Payer: Self-pay | Admitting: Medical Oncology

## 2023-01-01 ENCOUNTER — Other Ambulatory Visit: Payer: Medicare Other

## 2023-01-01 ENCOUNTER — Other Ambulatory Visit: Payer: Self-pay

## 2023-01-01 VITALS — BP 136/51 | HR 114 | Temp 98.9°F | Resp 18

## 2023-01-01 DIAGNOSIS — R110 Nausea: Secondary | ICD-10-CM

## 2023-01-01 DIAGNOSIS — D709 Neutropenia, unspecified: Secondary | ICD-10-CM

## 2023-01-01 DIAGNOSIS — E860 Dehydration: Secondary | ICD-10-CM

## 2023-01-01 DIAGNOSIS — C3411 Malignant neoplasm of upper lobe, right bronchus or lung: Secondary | ICD-10-CM | POA: Diagnosis not present

## 2023-01-01 DIAGNOSIS — G893 Neoplasm related pain (acute) (chronic): Secondary | ICD-10-CM

## 2023-01-01 DIAGNOSIS — M898X5 Other specified disorders of bone, thigh: Secondary | ICD-10-CM

## 2023-01-01 DIAGNOSIS — C3431 Malignant neoplasm of lower lobe, right bronchus or lung: Secondary | ICD-10-CM

## 2023-01-01 DIAGNOSIS — Z5111 Encounter for antineoplastic chemotherapy: Secondary | ICD-10-CM

## 2023-01-01 DIAGNOSIS — Z515 Encounter for palliative care: Secondary | ICD-10-CM

## 2023-01-01 DIAGNOSIS — C3490 Malignant neoplasm of unspecified part of unspecified bronchus or lung: Secondary | ICD-10-CM

## 2023-01-01 LAB — CBC WITH DIFFERENTIAL (CANCER CENTER ONLY)
Abs Immature Granulocytes: 0.09 10*3/uL — ABNORMAL HIGH (ref 0.00–0.07)
Basophils Absolute: 0 10*3/uL (ref 0.0–0.1)
Basophils Relative: 1 %
Eosinophils Absolute: 0 10*3/uL (ref 0.0–0.5)
Eosinophils Relative: 1 %
HCT: 28.5 % — ABNORMAL LOW (ref 39.0–52.0)
Hemoglobin: 9.5 g/dL — ABNORMAL LOW (ref 13.0–17.0)
Immature Granulocytes: 8 %
Lymphocytes Relative: 28 %
Lymphs Abs: 0.3 10*3/uL — ABNORMAL LOW (ref 0.7–4.0)
MCH: 27.1 pg (ref 26.0–34.0)
MCHC: 33.3 g/dL (ref 30.0–36.0)
MCV: 81.2 fL (ref 80.0–100.0)
Monocytes Absolute: 0.2 10*3/uL (ref 0.1–1.0)
Monocytes Relative: 13 %
Neutro Abs: 0.6 10*3/uL — ABNORMAL LOW (ref 1.7–7.7)
Neutrophils Relative %: 49 %
Platelet Count: 209 10*3/uL (ref 150–400)
RBC: 3.51 MIL/uL — ABNORMAL LOW (ref 4.22–5.81)
RDW: 15.8 % — ABNORMAL HIGH (ref 11.5–15.5)
Smear Review: NORMAL
WBC Count: 1.2 10*3/uL — ABNORMAL LOW (ref 4.0–10.5)
nRBC: 0 % (ref 0.0–0.2)

## 2023-01-01 LAB — CMP (CANCER CENTER ONLY)
ALT: 17 U/L (ref 0–44)
AST: 17 U/L (ref 15–41)
Albumin: 3.6 g/dL (ref 3.5–5.0)
Alkaline Phosphatase: 82 U/L (ref 38–126)
Anion gap: 10 (ref 5–15)
BUN: 20 mg/dL (ref 8–23)
CO2: 28 mmol/L (ref 22–32)
Calcium: 9 mg/dL (ref 8.9–10.3)
Chloride: 99 mmol/L (ref 98–111)
Creatinine: 0.91 mg/dL (ref 0.61–1.24)
GFR, Estimated: >60 mL/min (ref >60)
Glucose, Bld: 101 mg/dL — ABNORMAL HIGH (ref 70–99)
Potassium: 3.8 mmol/L (ref 3.5–5.1)
Sodium: 137 mmol/L (ref 135–145)
Total Bilirubin: 0.5 mg/dL (ref 0.3–1.2)
Total Protein: 6.3 g/dL — ABNORMAL LOW (ref 6.5–8.1)

## 2023-01-01 MED ORDER — SODIUM CHLORIDE 0.9 % IV SOLN
150.0000 mg | Freq: Once | INTRAVENOUS | Status: AC
  Administered 2023-01-01: 150 mg via INTRAVENOUS
  Filled 2023-01-01: qty 150

## 2023-01-01 MED ORDER — ONDANSETRON 8 MG PO TBDP: 8.0000 mg | ORAL_TABLET | Freq: Three times a day (TID) | ORAL | 2 refills | Status: DC | PRN

## 2023-01-01 MED ORDER — PROCHLORPERAZINE MALEATE 10 MG PO TABS: 10.0000 mg | ORAL_TABLET | Freq: Four times a day (QID) | ORAL | 0 refills | Status: DC | PRN

## 2023-01-01 MED ORDER — SODIUM CHLORIDE 0.9 % IV SOLN: Freq: Once | INTRAVENOUS | Status: AC

## 2023-01-01 MED ORDER — FILGRASTIM-SNDZ 480 MCG/0.8ML IJ SOSY
480.0000 ug | PREFILLED_SYRINGE | Freq: Once | INTRAMUSCULAR | Status: AC
  Administered 2023-01-01: 480 ug via SUBCUTANEOUS
  Filled 2023-01-01: qty 0.8

## 2023-01-01 NOTE — Telephone Encounter (Signed)
I called [facility].-Linda cannot locate a referral that was faxed ( with receipt) on 02/08. I refaxed it again with demographics and progress note. Vaughan Basta said she received the documents/. Dtr notified .

## 2023-01-01 NOTE — Progress Notes (Unsigned)
Pt messaged reporting he is still having nausea, and diarrhea. Per Lexine Baton, NP, order placed under signed and held for IVF, dex, and emend.

## 2023-01-02 ENCOUNTER — Inpatient Hospital Stay: Payer: Medicare Other

## 2023-01-02 ENCOUNTER — Other Ambulatory Visit: Payer: Self-pay

## 2023-01-02 VITALS — BP 138/58 | HR 102 | Temp 98.6°F | Resp 18

## 2023-01-02 DIAGNOSIS — D709 Neutropenia, unspecified: Secondary | ICD-10-CM

## 2023-01-02 DIAGNOSIS — E860 Dehydration: Secondary | ICD-10-CM

## 2023-01-02 DIAGNOSIS — C3411 Malignant neoplasm of upper lobe, right bronchus or lung: Secondary | ICD-10-CM | POA: Diagnosis not present

## 2023-01-02 MED ORDER — FILGRASTIM-SNDZ 480 MCG/0.8ML IJ SOSY
480.0000 ug | PREFILLED_SYRINGE | Freq: Once | INTRAMUSCULAR | Status: AC
  Administered 2023-01-02: 480 ug via SUBCUTANEOUS
  Filled 2023-01-02: qty 0.8

## 2023-01-05 ENCOUNTER — Ambulatory Visit (INDEPENDENT_AMBULATORY_CARE_PROVIDER_SITE_OTHER): Payer: Medicare Other | Admitting: Psychologist

## 2023-01-05 DIAGNOSIS — F4321 Adjustment disorder with depressed mood: Secondary | ICD-10-CM | POA: Diagnosis not present

## 2023-01-05 NOTE — Progress Notes (Signed)
                Zedric Deroy, PsyD 

## 2023-01-05 NOTE — Progress Notes (Signed)
[facility] Counsel[facility] Initial Adult Exam  Name: Thomas [name] Date: 01/05/2023 MRN: [id number] DOB: 09/09/1951 PCP: Patient, No Pcp Per  Time spent: 09:03 am to 09:51 am; total time: 48 minutes  This session was held via video webex teletherapy due to the coronavirus risk at this time. The patient consented to video teletherapy and was located at his home during this session. He is aware it is the responsibility of the patient to secure confidentiality on his end of the session. The provider was in a private home office for the duration of this session. Limits of confidentiality were discussed with the patient.   Guardian/Payee:  NA    Paperwork requested: No   Reason for Visit /Presenting Problem: Some mild depression secondary to metastatic cancer  Mental Status Exam: Appearance:   Well Groomed     Behavior:  Appropriate  Motor:  Normal  Speech/Language:   Clear and Coherent  Affect:  Appropriate  Mood:  normal  Thought process:  normal  Thought content:    WNL  Sensory/Perceptual disturbances:    WNL  Orientation:  oriented to person, place, and time/date  Attention:  Good  Concentration:  Good  Memory:  WNL  Fund of knowledge:   Good  Insight:    Good  Judgment:   Good  Impulse Control:  Good    Reported Symptoms:  The patient endorsed experiencing the following: feeling down, fatigue, sad, and low mood. He denied suicidal and homicidal ideation.   Risk Assessment: Danger to Self:  No Self-injurious Behavior: No Danger to Others: No Duty to Warn:no Physical Aggression / Violence:No  Access to Firearms a concern: No  Gang Involvement:No  Patient / guardian was educated about steps to take if suicide or homicide risk level increases between visits: n/a While future psychiatric events cannot be accurately predicted, the patient does not currently require acute inpatient psychiatric care and does not currently meet [facility] involuntary commitment  criteria.  Substance Abuse History: Current substance abuse: No     Past Psychiatric History:   No previous psychological problems have been observed Outpatient Providers:NA History of Psych Hospitalization: No  Psychological Testing:  NA    Abuse History:  Victim of: No.,  NA    Report needed: No. Victim of Neglect:No. Perpetrator of  NA   Witness / Exposure to Domestic Violence: No   Protective Services Involvement: No  Witness to [employer] Violence:  No   Family History:  Family History  Problem Relation Age of Onset   Heart disease Mother 36   Alcohol abuse Mother    Depression Mother    Hypertension Father    Sleep apnea Father    Cancer Sister        hodgkins, breast   Colon cancer Neg Hx    Pancreatic cancer Neg Hx    Rectal cancer Neg Hx    Stomach cancer Neg Hx    Esophageal cancer Neg Hx    Inflammatory bowel disease Neg Hx    Liver disease Neg Hx     Living situation: the patient lives with their family  Sexual Orientation: Straight  Relationship Status: married  Name of spouse / other:Thomas [name]. Married for 29 years If a parent, number of children / ages:Patient has a 36 year old daughter and a son.   Support Systems: spouse friends family  Financial Stress:  No   Income/Employment/Disability: Employment  Armed forces logistics/support/administrative officer: No   Educational History: Education: Scientist, product/process development:  Spiritual  Any cultural differences that may affect / interfere with treatment:  not applicable   Recreation/Hobbies: gardening  Stressors: Health problems    Strengths: Supportive Relationships, Family, and Friends  Barriers:  NA   Legal History: Pending legal issue / charges: The patient has no significant history of legal issues. History of legal issue / charges:  NA  Medical History/Surgical History: reviewed Past Medical History:  Diagnosis Date   Abdominal wall pain 04/02/2021   Acute ischemic stroke ([place]) 03/22/2020    Avulsed toenail, initial encounter 05/04/2018   Dyspnea on exertion 02/10/2020   ED (erectile dysfunction)    Epidermoid cyst of neck 10/16/2017   Essential hypertension 07/21/2017   GERD (gastroesophageal reflux disease)    Gout of multiple sites 07/21/2017   History of kidney stones    History of radiation therapy    Right Chest, Right Pelvis- 10/30/22-11/13/22- Sr. 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   Urgency of urination    URI (upper respiratory infection) 11/08/2014   Wears glasses     Past Surgical History:  Procedure Laterality Date   COLONOSCOPY WITH PROPOFOL  2017   CYSTOSCOPY/RETROGRADE/URETEROSCOPY/STONE EXTRACTION WITH BASKET Left 04/16/2018   Procedure: CYSTOSCOPY/RETROGRADE/URETEROSCOPY/STONE EXTRACTION WITH BASKET/ HOLMIUM LASER LITHOTRIPSY/ STENT PLACEMENT;  Surgeon: Ardis Hughs, MD;  [facility];  Service: Urology;  Laterality: Left;   HERNIA REPAIR Right 2022   inguinal   HOLMIUM LASER APPLICATION Left 81/82/9937   Procedure: HOLMIUM LASER APPLICATION;  Surgeon: Ardis Hughs, MD;  Location: [facility];  Service: Urology;  Laterality: Left;   TOTAL HIP ARTHROPLASTY Right 06/30/2022   Procedure: RIGHT TOTAL HIP ARTHROPLASTY ANTERIOR APPROACH;  Surgeon: Leandrew Koyanagi, MD;  Location: [place];  Service: Orthopedics;  Laterality: Right;  3-C    Medications: Current Outpatient Medications  Medication Sig Dispense Refill   acetaminophen (TYLENOL) 650 MG CR tablet Take 1,300 mg  by mouth every 8 (eight) hours.     allopurinol (ZYLOPRIM) 300 MG tablet Take 300 mg by mouth in the morning.     amLODipine (NORVASC) 5 MG tablet Take 1 tablet (5 mg total) by mouth daily. (Patient taking differently: Take 5 mg by mouth in the morning.) 190 tablet 3   CLARITIN 10 MG tablet Take 10 mg by mouth in the morning.     diphenoxylate-atropine (LOMOTIL) 2.5-0.025 MG tablet Take 1 tablet by mouth every 4 (four) hours as needed for diarrhea or loose stools. Take 2 tablets after the first stool, then take as directed. (Patient not taking: Reported on 12/12/2022) 60 tablet 0   fentaNYL (DURAGESIC) 50 MCG/HR Place 1 patch onto the skin every 3 days. 10 patch 0   folic acid (FOLVITE) 1 MG tablet Take 1 tablet (1 mg total) by mouth daily. 30 tablet 4   gabapentin (NEURONTIN) 300 MG capsule Take 600 mg by mouth every evening.     HYDROmorphone (DILAUDID) 8 MG tablet Take 1 tablet (8 mg total) by mouth every 4 (four) hours as needed for  severe pain. 90 tablet 0   IMODIUM A-D 2 MG tablet Take 2 mg by mouth every 4 (four) hours as needed for diarrhea or loose stools.     lidocaine-prilocaine (EMLA) cream Apply to the Port-A-Cath site 30-60 minutes before treatment 30 g 0   minoxidil (LONITEN) 10 MG tablet Take 1-1.5 tablets (10-15 mg total) by mouth 2 (two) times daily. TAKE 1 TABLET IN THE MORNING AND TAKE 1/2 TABLET AT NIGHT (Patient taking differently: Take 10 mg by mouth See admin instructions. Take 10 mg by mouth in the morning and evening) 135 tablet 1   ondansetron (ZOFRAN-ODT) 8 MG disintegrating tablet Take 1 tablet (8 mg total) by mouth every 8 (eight) hours as needed for nausea or vomiting. 30 tablet 2   pantoprazole (PROTONIX) 40 MG tablet Take 1 tablet (40 mg total) by mouth 2 (two) times daily. (Patient taking differently: Take 40 mg by mouth 2 (two) times daily before a meal.) 60 tablet 2   polyethylene glycol (MIRALAX / GLYCOLAX) 17 g packet Take 17 g by mouth 2 (two) times daily. (Patient  taking differently: Take 17 g by mouth 2 (two) times daily as needed for mild constipation.) 14 each 0   PRESCRIPTION MEDICATION Apply 1 Application topically See admin instructions. Compounded pain cream ([place])- apply to the feet twice a day as needed for pain     PRESCRIPTION MEDICATION CPAP- At bedtime     prochlorperazine (COMPAZINE) 10 MG tablet Take 1 tablet (10 mg total) by mouth every 6 (six) hours as needed for nausea or vomiting. 30 tablet 0   senna (SENOKOT) 8.6 MG TABS tablet Take 2 tablets (17.2 mg total) by mouth 2 (two) times daily. (Patient not taking: Reported on 12/12/2022) 120 tablet 6   valsartan (DIOVAN) 320 MG tablet TAKE 1 TABLET BY MOUTH EVERY DAY (Patient taking differently: Take 320 mg by mouth in the morning.) 90 tablet 3   No current facility-administered medications for this visit.    Allergies  Allergen Reactions   Statins Other (See Comments)    Jaw tightness and severe muscle pain- Pravastatin     Diagnoses:  F43.21 adjustment disorder with depressed mood  Plan of Care: The patient is a 72 year old Caucasian male who was referred due to experiencing an adjustment with a diagnosis of metastatic cancer. The patient lives at home with his wife, and a dog. The patient has a son and daughter. The patient meets criteria for a diagnosis of F43.21 adjustment disorder with depressed mood based off of the following:  feeling down, fatigue, sad, and low mood. He denied suicidal and homicidal ideation.   The patient wants to process thoughts and emotions. The patient wants to work through the concept of dying with dignity.   This psychologist makes the recommendation that the patient participate in therapy weekly.    Conception Chancy, PsyD

## 2023-01-07 ENCOUNTER — Other Ambulatory Visit (HOSPITAL_COMMUNITY): Payer: Self-pay

## 2023-01-07 ENCOUNTER — Other Ambulatory Visit: Payer: Self-pay

## 2023-01-07 DIAGNOSIS — G893 Neoplasm related pain (acute) (chronic): Secondary | ICD-10-CM

## 2023-01-07 DIAGNOSIS — M898X5 Other specified disorders of bone, thigh: Secondary | ICD-10-CM

## 2023-01-07 DIAGNOSIS — C3490 Malignant neoplasm of unspecified part of unspecified bronchus or lung: Secondary | ICD-10-CM

## 2023-01-07 DIAGNOSIS — G4733 Obstructive sleep apnea (adult) (pediatric): Secondary | ICD-10-CM | POA: Diagnosis not present

## 2023-01-07 DIAGNOSIS — Z515 Encounter for palliative care: Secondary | ICD-10-CM

## 2023-01-07 MED ORDER — HYDROMORPHONE HCL 8 MG PO TABS
8.0000 mg | ORAL_TABLET | ORAL | 0 refills | Status: DC | PRN
  Filled 2023-01-09: qty 90, 15d supply, fill #0

## 2023-01-08 ENCOUNTER — Inpatient Hospital Stay: Payer: Medicare Other

## 2023-01-08 ENCOUNTER — Encounter: Payer: Self-pay | Admitting: Internal Medicine

## 2023-01-08 ENCOUNTER — Other Ambulatory Visit: Payer: Self-pay

## 2023-01-08 ENCOUNTER — Other Ambulatory Visit (HOSPITAL_COMMUNITY): Payer: Self-pay

## 2023-01-08 ENCOUNTER — Other Ambulatory Visit: Payer: Medicare Other

## 2023-01-08 DIAGNOSIS — C3411 Malignant neoplasm of upper lobe, right bronchus or lung: Secondary | ICD-10-CM | POA: Diagnosis not present

## 2023-01-08 DIAGNOSIS — R110 Nausea: Secondary | ICD-10-CM

## 2023-01-08 DIAGNOSIS — Z5111 Encounter for antineoplastic chemotherapy: Secondary | ICD-10-CM

## 2023-01-08 LAB — CMP (CANCER CENTER ONLY)
ALT: 13 U/L (ref 0–44)
AST: 15 U/L (ref 15–41)
Albumin: 3.4 g/dL — ABNORMAL LOW (ref 3.5–5.0)
Alkaline Phosphatase: 97 U/L (ref 38–126)
Anion gap: 8 (ref 5–15)
BUN: 14 mg/dL (ref 8–23)
CO2: 29 mmol/L (ref 22–32)
Calcium: 7.9 mg/dL — ABNORMAL LOW (ref 8.9–10.3)
Chloride: 102 mmol/L (ref 98–111)
Creatinine: 0.87 mg/dL (ref 0.61–1.24)
GFR, Estimated: >60 mL/min (ref >60)
Glucose, Bld: 131 mg/dL — ABNORMAL HIGH (ref 70–99)
Potassium: 4.3 mmol/L (ref 3.5–5.1)
Sodium: 139 mmol/L (ref 135–145)
Total Bilirubin: 0.4 mg/dL (ref 0.3–1.2)
Total Protein: 6 g/dL — ABNORMAL LOW (ref 6.5–8.1)

## 2023-01-08 LAB — CBC WITH DIFFERENTIAL (CANCER CENTER ONLY)
Abs Immature Granulocytes: 0.04 10*3/uL (ref 0.00–0.07)
Basophils Absolute: 0 10*3/uL (ref 0.0–0.1)
Basophils Relative: 0 %
Eosinophils Absolute: 0 10*3/uL (ref 0.0–0.5)
Eosinophils Relative: 0 %
HCT: 27 % — ABNORMAL LOW (ref 39.0–52.0)
Hemoglobin: 8.8 g/dL — ABNORMAL LOW (ref 13.0–17.0)
Immature Granulocytes: 1 %
Lymphocytes Relative: 15 %
Lymphs Abs: 0.7 10*3/uL (ref 0.7–4.0)
MCH: 27.3 pg (ref 26.0–34.0)
MCHC: 32.6 g/dL (ref 30.0–36.0)
MCV: 83.9 fL (ref 80.0–100.0)
Monocytes Absolute: 0.5 10*3/uL (ref 0.1–1.0)
Monocytes Relative: 11 %
Neutro Abs: 3.4 10*3/uL (ref 1.7–7.7)
Neutrophils Relative %: 73 %
Platelet Count: 112 10*3/uL — ABNORMAL LOW (ref 150–400)
RBC: 3.22 MIL/uL — ABNORMAL LOW (ref 4.22–5.81)
RDW: 16.8 % — ABNORMAL HIGH (ref 11.5–15.5)
WBC Count: 4.6 10*3/uL (ref 4.0–10.5)
nRBC: 0 % (ref 0.0–0.2)

## 2023-01-09 ENCOUNTER — Other Ambulatory Visit (HOSPITAL_COMMUNITY): Payer: Self-pay

## 2023-01-09 ENCOUNTER — Telehealth: Payer: Self-pay | Admitting: *Deleted

## 2023-01-09 ENCOUNTER — Other Ambulatory Visit: Payer: Self-pay

## 2023-01-09 NOTE — Telephone Encounter (Signed)
Received PC from [facility] with [facility], she states they are unable to take this patient for care because they do not have enough staff at this time.  Patient informed, he will try to find another [place] that is covered by his insurance & will let us know if he needs another referral.

## 2023-01-09 NOTE — Telephone Encounter (Signed)
PC received from patient saying he has not spoken with anyone from [facility] about setting up his care.  This RN contacted Vaughan Basta at [facility] - referral paperwork has been faxed to them on 12/25/22 & again on 01/11/23 with fax confirmation received both times.  Vaughan Basta states she is trying to find the liaison for this case, will call back to this office.  Office number & patient's number given.

## 2023-01-12 ENCOUNTER — Other Ambulatory Visit: Payer: Self-pay | Admitting: Internal Medicine

## 2023-01-12 ENCOUNTER — Other Ambulatory Visit: Payer: Self-pay | Admitting: Physician Assistant

## 2023-01-12 DIAGNOSIS — C3491 Malignant neoplasm of unspecified part of right bronchus or lung: Secondary | ICD-10-CM

## 2023-01-12 DIAGNOSIS — G4733 Obstructive sleep apnea (adult) (pediatric): Secondary | ICD-10-CM | POA: Diagnosis not present

## 2023-01-12 NOTE — Progress Notes (Signed)
[facility] OFFICE PROGRESS NOTE  Patient, No Pcp Per No address on file  DIAGNOSIS: Stage IV (T1b, N3, M1 C) non-small cell lung cancer, adenocarcinoma presented with right upper lobe lung nodule in addition to mediastinal and right hilar adenopathy as well as right-sided pleural metastatic disease with malignant right pleural effusion and multiple osseous metastatic lesions and single hypermetabolic retroperitoneal lymph node diagnosed in December 2023   Biomarker Findings Microsatellite status - MS-Stable Tumor Mutational Burden - 2 Muts/Mb Genomic Findings For a complete list of the genes assayed, please refer to the Appendix. KRAS G12C KEAP1 splice site 640-1G>A MTAP loss CDKN2A/B CDKN2A loss, CDKN2B loss MDM4 S367L - subclonal? 7 Disease relevant genes with no reportable alterations: ALK, BRAF, EGFR, ERBB2, MET, RET, ROS1   PDL1 Expression: 0%  PRIOR THERAPY: Status post palliative radiotherapy to the metastatic disease in the right hip area under the care of Dr. Roselind Messier   CURRENT THERAPY:  Systemic chemotherapy with carboplatin for AUC of 5, Alimta 500 Mg/M2 and Keytruda 200 Mg IV every 3 weeks.  First dose December 03, 2022.  Stat post 2 cycles.  Rande Lawman was held for cycle #2 due to recent diverticulitis.  Starting from cycle #3 Dr. Arbutus Ped has reduce the dose of carboplatin to an AUC of 4 and Alimta to 400 mg/m.  Starting from cycle #3, he will receive Udenyca injections on day 3.  Rande Lawman will be resumed starting from cycle #3.  INTERVAL HISTORY: Thomas [name] 72 y.o. male returns the clinic today for a follow-up visit accompanied by his wife.  The patient has been having a challenging time with treatment.  He often experiences diarrhea and he had a case of mild diverticulitis following his first cycle of treatment.  Dr. Arbutus Ped had held Thomas [name] starting from cycle #2 due to the recent diverticulitis.  The patient also experiences neutropenia following treatment  which requires G-CSF injections.  Moving forward, I reached out to the prior authorization team and we will arrange for him to receive Udencyca injections on day 3.   Overall, the patient is feeling fair today.  The patient experiences fatigue, diarrhea, and weakness following chemo.  He uses the brat diet after chemo he lost approximately 10 pounds since last being seen.  He is trying to increase his protein intake and eat small frequent meals.  The patient mentions getting additional IV fluid in the interval since his last appointment helped him significantly.  After he experiences diarrhea, the patient then often experiences diarrhea.  He states that he has had very few normal bowel movements since starting treatment between intermittent constipation and diarrhea.  He has Imodium if needed.  He never picked up the prescription for Lomotil that was sent last month.  The patient also has had some nausea with 1 episode of vomiting.  He has a prescription for Compazine and Zofran.  He is not alternating these if needed.  At the patient's last appointment, Dr. Arbutus Ped gave him a Medrol Dosepak which significantly helped his pain.  He is wondering if he can be on steroids continuously as that helped a lot with his rib pain and the pain in his pelvis/legs from his metastatic disease.  Despite taking Dilaudid, gabapentin, and Lidoderm patches, he had not had significant relief in his cancer related pain.     Otherwise he denies any fever, chills, or night sweats.  He reports his baseline dyspnea on exertion.  Sometimes reports shortness of breath at night related to  pain in his ribs.  He denies any cough or hemoptysis.  He follows closely with palliative care regarding his pain for which he is currently on fentanyl patches and Dilaudid and gabapentin.  He is expected to follow-up with them in the infusion room today.  Denies any rashes or skin changes.  Denies any headache or visual changes.  He is here today for  evaluation repeat blood work before considering starting cycle #2.   MEDICAL HISTORY: Past Medical History:  Diagnosis Date   Abdominal wall pain 04/02/2021   Acute ischemic stroke (HCC) 03/22/2020   Avulsed toenail, initial encounter 05/04/2018   Dyspnea on exertion 02/10/2020   ED (erectile dysfunction)    Epidermoid cyst of neck 10/16/2017   Essential hypertension 07/21/2017   GERD (gastroesophageal reflux disease)    Gout of multiple sites 07/21/2017   History of kidney stones    History of radiation therapy    Right Chest, Right Pelvis- 10/30/22-11/13/22- Sr. 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  MEDICATIONS:  Current Outpatient Medications  Medication Sig Dispense Refill   FeFum-FePoly-FA-B Cmp-C-Biot (INTEGRA PLUS) CAPS Take 1 capsule by mouth every morning. 30 capsule 2   predniSONE (DELTASONE) 10 MG tablet Take 1 tablet daily in the AM 30 tablet 0   acetaminophen (TYLENOL) 650 MG CR tablet Take 1,300 mg by mouth every 8 (eight) hours.     allopurinol (ZYLOPRIM) 300 MG tablet Take 300 mg by mouth in the morning.     amLODipine (NORVASC) 5 MG tablet Take 1 tablet (5 mg total) by mouth daily. (Patient taking differently: Take 5 mg by mouth in the morning.) 190 tablet 3   CLARITIN 10 MG tablet Take 10 mg by mouth in the morning.     diphenoxylate-atropine (LOMOTIL) 2.5-0.025 MG tablet Take 1 tablet by mouth 4  (four) times daily as needed for diarrhea or loose stools. Take 2 tablets after the first stool, then take as directed. 60 tablet 0   fentaNYL (DURAGESIC) 50 MCG/HR Place 1 patch onto the skin every 3 days. 10 patch 0   folic acid (FOLVITE) 1 MG tablet Take 1 tablet (1 mg total) by mouth daily. 30 tablet 4   gabapentin (NEURONTIN) 300 MG capsule Take 600 mg by mouth every evening.     HYDROmorphone (DILAUDID) 8 MG tablet Take 1 tablet (8 mg total) by mouth every 4 (four) hours as needed for severe pain. 90 tablet 0   IMODIUM A-D 2 MG tablet Take 2 mg by mouth every 4 (four) hours as needed for diarrhea or loose stools.     lidocaine-prilocaine (EMLA) cream Apply to the Port-A-Cath site 30-60 minutes before treatment 30 g 0   minoxidil (LONITEN) 10 MG tablet Take 1-1.5 tablets (10-15 mg total) by mouth 2 (two) times daily. TAKE 1 TABLET IN THE MORNING AND TAKE 1/2 TABLET AT NIGHT (Patient taking differently:  Take 10 mg by mouth See admin instructions. Take 10 mg by mouth in the morning and evening) 135 tablet 1   ondansetron (ZOFRAN-ODT) 8 MG disintegrating tablet Take 1 tablet (8 mg total) by mouth every 8 (eight) hours as needed for nausea or vomiting. 30 tablet 2   pantoprazole (PROTONIX) 40 MG tablet Take 1 tablet (40 mg total) by mouth 2 (two) times daily. (Patient taking differently: Take 40 mg by mouth 2 (two) times daily before a meal.) 60 tablet 2   polyethylene glycol (MIRALAX / GLYCOLAX) 17 g packet Take 17 g by mouth 2 (two) times daily. (Patient taking differently: Take 17 g by mouth 2 (two) times daily as needed for mild constipation.) 14 each 0   PRESCRIPTION MEDICATION Apply 1 Application topically See admin instructions. Compounded pain cream ([facility])- apply to the feet twice a day as needed for pain     PRESCRIPTION MEDICATION CPAP- At bedtime     prochlorperazine (COMPAZINE) 10 MG tablet Take 1 tablet (10 mg total) by mouth every 6 (six) hours as needed for nausea or  vomiting. 30 tablet 0   senna (SENOKOT) 8.6 MG TABS tablet Take 2 tablets (17.2 mg total) by mouth 2 (two) times daily. (Patient not taking: Reported on 12/12/2022) 120 tablet 6   valsartan (DIOVAN) 320 MG tablet TAKE 1 TABLET BY MOUTH EVERY DAY (Patient taking differently: Take 320 mg by mouth in the morning.) 90 tablet 3   No current facility-administered medications for this visit.    SURGICAL HISTORY:  Past Surgical History:  Procedure Laterality Date   COLONOSCOPY WITH PROPOFOL  2017   CYSTOSCOPY/RETROGRADE/URETEROSCOPY/STONE EXTRACTION WITH BASKET Left 04/16/2018   Procedure: CYSTOSCOPY/RETROGRADE/URETEROSCOPY/STONE EXTRACTION WITH BASKET/ HOLMIUM LASER LITHOTRIPSY/ STENT PLACEMENT;  Surgeon: Crist Fat, MD;  Location: [facility];  Service: Urology;  Laterality: Left;   HERNIA REPAIR Right 2022   inguinal   HOLMIUM LASER APPLICATION Left 04/16/2018   Procedure: HOLMIUM LASER APPLICATION;  Surgeon: Crist Fat, MD;  Location: [facility];  Service: Urology;  Laterality: Left;   TOTAL HIP ARTHROPLASTY Right 06/30/2022   Procedure: RIGHT TOTAL HIP ARTHROPLASTY ANTERIOR APPROACH;  Surgeon: Tarry Kos, MD;  Location: [facility] OR;  Service: Orthopedics;  Laterality: Right;  3-C    REVIEW OF SYSTEMS:   Review of Systems  Constitutional: Positive for fatigue, generalized weakness, appetite change, and weight loss since last being seen.  Denies any fever or chills.   HENT: Negative for mouth sores, nosebleeds, sore throat and trouble swallowing.   Eyes: Negative for eye problems and icterus.  Respiratory: Positive for baseline dyspnea on exertion.  Negative for cough, hemoptysis, and wheezing.   Cardiovascular: Positive for right-sided rib pain.  Negative for  leg swelling.  Gastrointestinal: Positive for nausea following treatment and intermittent alternating diarrhea and constipation.  1 episode of vomiting in the interval.    Genitourinary:  Negative for bladder incontinence, difficulty urinating, dysuria, frequency and hematuria.   Musculoskeletal: Negative for back pain, gait problem, neck pain and neck stiffness.  Skin: Negative for itching and rash.  Neurological: Negative for dizziness, extremity weakness, gait problem, headaches, light-headedness and seizures.  Hematological: Negative for adenopathy. Does not bruise/bleed easily.  Psychiatric/Behavioral: Negative for confusion, depression and sleep disturbance. The patient is not nervous/anxious.     PHYSICAL EXAMINATION:  Blood pressure 131/65, pulse 90, temperature 98.1 F (36.7 C), temperature source Temporal, resp. rate 17, height 5\' 11"  (1.803 m), weight 182 lb  6.4 oz (82.7 kg), SpO2 97 %.  ECOG PERFORMANCE STATUS: 1  Physical Exam  Constitutional: Oriented to person, place, and time and well-developed, well-nourished, and in no distress.  HENT:  Head: Normocephalic and atraumatic.  Mouth/Throat: Oropharynx is clear and moist. No oropharyngeal exudate.  Eyes: Conjunctivae are normal. Right eye exhibits no discharge. Left eye exhibits no discharge. No scleral icterus.  Neck: Normal range of motion. Neck supple.  Cardiovascular: Normal rate, regular rhythm, normal heart sounds and intact distal pulses.   Pulmonary/Chest: Effort normal and breath sounds normal. No respiratory distress. No wheezes. No rales.  Abdominal: Soft. Bowel sounds are normal. Exhibits no distension and no mass. There is no tenderness.  Musculoskeletal: Normal range of motion. Exhibits no edema.  Lymphadenopathy:    No cervical adenopathy.  Neurological: Alert and oriented to person, place, and time. Exhibits normal muscle tone. Gait normal. Coordination normal.  Skin: Skin is warm and dry. No rash noted. Not diaphoretic. No erythema. No pallor.  Psychiatric: Mood, memory and judgment normal.  Vitals reviewed.  LABORATORY DATA: Lab Results  Component Value Date   WBC 6.1 01/14/2023   HGB  8.6 (L) 01/14/2023   HCT 26.9 (L) 01/14/2023   MCV 85.7 01/14/2023   PLT 382 01/14/2023      Chemistry      Component Value Date/Time   NA 137 01/14/2023 0749   NA 140 06/07/2021 0952   K 4.2 01/14/2023 0749   CL 101 01/14/2023 0749   CO2 28 01/14/2023 0749   BUN 10 01/14/2023 0749   BUN 17 06/07/2021 0952   CREATININE 0.88 01/14/2023 0749   CREATININE 0.92 11/14/2016 1652      Component Value Date/Time   CALCIUM 8.3 (L) 01/14/2023 0749   ALKPHOS 91 01/14/2023 0749   AST 15 01/14/2023 0749   ALT 11 01/14/2023 0749   BILITOT 0.4 01/14/2023 0749       RADIOGRAPHIC STUDIES:  No results found.   ASSESSMENT/PLAN:  This is a very pleasant 72 year old Caucasian male diagnosed with stage IV (T1b, N3, M1C) non-small cell lung cancer, adenocarcinoma. The patient presented with a right upper lobe lung nodule in addition to mediastinal and hilar adenopathy as well as right-sided pleural metastatic disease and malignant right pleural effusion and multiple osseous metastatic lesions and a single hypermetabolic retroperitoneal lymph node. He was diagnosed in December 2023.   The patient is positive for a K-ras G12 C mutation which can be used in the second line setting.  He is negative for PD-L1 expression.   The patient completed palliative radiation under the care of Dr. Roselind Messier to the right hip and chest?  He continues to have quite a bit of pain in this area.  He follows closely palliative care locally for which she is on fentanyl and Dilaudid.   For his systemic treatment, the patient is currently on carboplatin for AUC of 5, Alimta 500 mg/m, Keytruda 200 mg IV every 3 weeks.  The patient is status post 2 cycles .  Rande Lawman was held from cycle #2 due to diverticulitis. he has had a challenging time with treatment and he often has neutropenia following treatment.  Starting from cycle #3, we will add Udenyca to his care plan.  Also starting from cycle #3, Dr. Arbutus Ped will reduce the dose  of carboplatin AUC 4 and Alimta to 400 mg/m.  Dr. Arbutus Ped would like to add Keytruda back to the care plan starting from cycle #3.   The patient was seen  with Dr. Arbutus Ped today.  Labs were reviewed.  For the anemia, we will continue to watch his blood counts closely on a weekly basis.  I have added to the sample to blood bank in the event that he requires a blood transfusion.  Dr. Arbutus Ped recommends that the patient start taking iron supplement.    Discussed with the patient that we would not want to exceed 10 mg of prednisone while on Keytruda.  However the patient states that the steroids helped his cancer related pain the most and his nausea.  Therefore we will start the patient on a 10 mg dose of prednisone.  If the patient's symptoms are manage, Dr. Arbutus Ped encouraged him to use 5 mg instead.  We will arrange for IV fluid on 3/1, 3/4, and 3/6 for 1 L over 2 hours.  I also resent the prescription that was sent last month by palliative care for his Lomotil.  I also encouraged the patient to alternate his antiemetic with Zofran and Compazine if needed.  We reviewed taking Claritin due to possible arthralgias from the [facility].  I will arrange for restaging CT scan of the chest, abdomen, pelvis prior to his next appointment.  Will follow with palliative care today and follow recommendations given by nutrition for his weight loss.  He is going to try and use the brat diet following treatment due to diarrhea as well as Imodium and Lomotil.   Hopeful that the patient will have less adverse side effects with dose reduced treatment.  Discussed with the patient the rationale why we would advise against ibuprofen and other NSAIDs due to his chemotherapy with Alimta.  The patient was advised to call immediately if he has any concerning symptoms in the interval. The patient voices understanding of current disease status and treatment options and is in agreement with the current care plan. All  questions were answered. The patient knows to call the clinic with any problems, questions or concerns. We can certainly see the patient much sooner if necessary  Orders Placed This Encounter  Procedures   CT Chest W Contrast    Standing Status:   Future    Standing Expiration Date:   01/14/2024    Order Specific Question:   If indicated for the ordered procedure, I authorize the administration of contrast media per Radiology protocol    Answer:   Yes    Order Specific Question:   Does the patient have a contrast media/X-ray dye allergy?    Answer:   No    Order Specific Question:   Preferred imaging location?    Answer:   [facility]   CT Abdomen Pelvis W Contrast    Standing Status:   Future    Standing Expiration Date:   01/14/2024    Order Specific Question:   If indicated for the ordered procedure, I authorize the administration of contrast media per Radiology protocol    Answer:   Yes    Order Specific Question:   Does the patient have a contrast media/X-ray dye allergy?    Answer:   No    Order Specific Question:   Preferred imaging location?    Answer:   [facility]    Order Specific Question:   Is Oral Contrast requested for this exam?    Answer:   Yes, Per Radiology protocol   Sample to Blood Bank    Standing Status:   Standing    Number of Occurrences:   5  Standing Expiration Date:   01/15/2024     Thomas Abraham Zamarian Scarano, PA-C 01/14/23  ADDENDUM: Hematology/Oncology Attending: I had a face-to-face encounter with the patient today.  I reviewed his record, lab and recommended his care plan.  This is a very pleasant 72 years old white male with a stage IV non-small cell lung cancer, adenocarcinoma with positive KRAS G12C mutation and negative PD-L1 expression.  He is status post palliative radiotherapy to the metastatic disease in the right hip under the care of Dr. Roselind Messier. The patient is currently undergoing systemic chemotherapy with carboplatin,  Alimta and Keytruda.  He has been tolerating the treatment well but with increasing fatigue and weakness as well as pancytopenia.  He also had significant diarrhea and diverticulitis after cycle #1 and Keytruda was held for cycle #2. I recommended for the patient to proceed with cycle #3 today as planned with Udenyca support because of the chemotherapy-induced neutropenia. I will also reduce his dose of carboplatin to AUC of 4 and Alimta 400 Mg/M2 starting from cycle #3.  He will resume his treatment with Keytruda with cycle #3. We will see the patient back for follow-up visit in 3 weeks for evaluation with repeat CT scan of the chest, abdomen and pelvis for restaging of his disease. For the lack of appetite and dehydration, I will arrange for the patient to receive IV hydration 2 days after his treatment. The patient was advised to call immediately if he has any other concerning symptoms in the interval. The total time spent in the appointment was 30 minutes. Disclaimer: This note was dictated with voice recognition software. Similar sounding words can inadvertently be transcribed and may be missed upon review. Lajuana Matte, MD

## 2023-01-13 ENCOUNTER — Ambulatory Visit (INDEPENDENT_AMBULATORY_CARE_PROVIDER_SITE_OTHER): Payer: Medicare Other | Admitting: Psychologist

## 2023-01-13 DIAGNOSIS — F4321 Adjustment disorder with depressed mood: Secondary | ICD-10-CM | POA: Diagnosis not present

## 2023-01-13 DIAGNOSIS — G4733 Obstructive sleep apnea (adult) (pediatric): Secondary | ICD-10-CM | POA: Diagnosis not present

## 2023-01-13 MED FILL — Dexamethasone Sodium Phosphate Inj 100 MG/10ML: INTRAMUSCULAR | Qty: 1 | Status: AC

## 2023-01-13 MED FILL — Fosaprepitant Dimeglumine For IV Infusion 150 MG (Base Eq): INTRAVENOUS | Qty: 5 | Status: AC

## 2023-01-13 NOTE — Progress Notes (Signed)
[place] Counselor/Therapist Progress Note  Patient ID: Thomas [name], MRN: [id number],    Date: 01/13/2023  Time Spent: 12:02 pm to 12:42 pm; total time: 40 minutes   This session was held via video webex teletherapy due to the coronavirus risk at this time. The patient consented to video teletherapy and was located at his home during this session. He is aware it is the responsibility of the patient to secure confidentiality on his end of the session. The provider was in a private home office for the duration of this session. Limits of confidentiality were discussed with the patient.   Treatment Type: Individual Therapy  Reported Symptoms: Concern about dying with dignity   Mental Status Exam: Appearance:  Well Groomed     Behavior: Appropriate  Motor: Normal  Speech/Language:  Clear and Coherent  Affect: Appropriate  Mood: normal  Thought process: normal  Thought content:   WNL  Sensory/Perceptual disturbances:   WNL  Orientation: oriented to person, place, and time/date  Attention: Good  Concentration: Good  Memory: WNL  Fund of knowledge:  Good  Insight:   Good  Judgment:  Good  Impulse Control: Good   Risk Assessment: Danger to Self:  No Self-injurious Behavior: No Danger to Others: No Duty to Warn:no Physical Aggression / Violence:No  Access to Firearms a concern: No  Gang Involvement:No   Subjective: Beginning the session, patient described himself as okay. After reviewing the treatment plan, patient disclosed that he is preparing to go through his next round of chemotherapy. From there, he spent the session reflecting on the theme of dying with dignity. He processed thoughts and emotions. He was agreeable to following up. He denied suicidal and homicidal ideation.    Interventions:  Worked on developing a therapeutic relationship with the patient using active listening and reflective statements. Provided emotional support using empathy and  validation. Reviewed the treatment plan with the patient. Processed how patient is feeling about upcoming chemotherapy round of treatment. Validated emotions. Explored if patient had expressed concerns to the medical team. Identified goals for the session. Identified the theme of dying with dignity. Explored what this meant to the patient. Used socratic questions to assist the patient. Provided psychoeducation about pain management and how to talk to loved ones about dying. Used examples to assist the patient. Validated that talking about dying is hard. Processed thoughts and emotions. Provided empathic statements. Assessed for suicidal and homicidal ideation.   Homework: NA  Next Session: Emotional support and begin discussions of transitions of care  Diagnosis: F43.21 adjustment disorder with depressed mood  Plan:   Goals Work through the grieving process and face reality of own death Accept emotional support from others around them Live life to the fullest, event though time may be limited Become as knowledgeable about the medical condition  Reduce fear, anxiety about the health condition  Accept the illness Accept the role of psychological and behavioral factors  Stabilize anxiety level wile increasing ability to function Learn and implement coping skills that result in a reduction of anxiety  Alleviate depressive symptoms Recognize, accept, and cope with depressive feelings Develop healthy thinking patterns Develop healthy interpersonal relationships  Objectives target date for all objectives is 01/06/2024 Identify feelings associated with the illness Family members share with each other feelings Identify the losses or limitations that have been experienced Verbalize acceptance of the reality of the medical condition Commit to learning and implement a proactive approach to managing personal stresses Verbalize an understanding of  the medical condition Work with therapist to develop  a plan for coping with stress Learn and implement skills for managing stress Engage in social, productive activities that are possible Engage in faith based activities implement positive imagery Identify coping skills and sources of emotional support Patient's partner and family members verbalize their fears regarding severity of health condition Identify sources of emotional distress  Learning and implement calming skills to reduce overall anxiety Learn and implement problem solving strategies Identify and engage in pleasant activities Learning and implement personal and interpersonal skills to reduce anxiety and improve interpersonal relationships Learn to accept limitations in life and commit to tolerating, rather than avoiding, unpleasant emotions while accomplishing meaningful goals Identify major life conflicts from the past and present that form the basis for present anxiety Learn and implement behavioral strategies Verbalize an understanding and resolution of current interpersonal problems Learn and implement problem solving and decision making skills Learn and implement conflict resolution skills to resolve interpersonal problems Verbalize an understanding of healthy and unhealthy emotions verbalize insight into how past relationships may be influence current experiences with depression Use mindfulness and acceptance strategies and increase value based behavior  Increase hopeful statements about the future.   Interventions Teach about stress and ways to handle stress Assist the patient in developing a coping action plan for stressors Conduct skills based training for coping strategies Train problem focused skills Sort out what activities the individual can do Encourage patient to rely upon his/her spiritual faith Teach the patient to use guided imagery Probe and evaluate family's ability to provide emotional support Allow family to share their fears Assist the patient in  identifying, sorting through, and verbalizing the various feelings generated by his/her medical condition Meet with family members  Ask patient list out limitations  Use stress inoculation training  Use Acceptance and Commitment Therapy to help client accept uncomfortable realities in order to accomplish value-consistent goals Reinforce the client's insight into the role of his/her past emotional pain and present anxiety  Discuss examples demonstrating that unrealistic worry overestimates the probability of threats and underestimate patient's ability  Assist the patient in analyzing his or her worries Help patient understand that avoidance is reinforcing  Behavioral activation help the client explore the relationship, nature of the dispute,  Help the client develop new interpersonal skills and relationships Conduct Problem so living therapy Teach conflict resolution skills Use a process-experiential approach Conduct TLDP Conduct ACT  The patient and clinician reviewed the treatment plan on 01/13/2023. The patient approved of the treatment plan.   Conception Chancy, PsyD

## 2023-01-13 NOTE — Progress Notes (Signed)
                Rosalin Buster, PsyD 

## 2023-01-14 ENCOUNTER — Inpatient Hospital Stay (HOSPITAL_BASED_OUTPATIENT_CLINIC_OR_DEPARTMENT_OTHER): Payer: Medicare Other | Admitting: Physician Assistant

## 2023-01-14 ENCOUNTER — Other Ambulatory Visit: Payer: Medicare Other

## 2023-01-14 ENCOUNTER — Inpatient Hospital Stay: Payer: Medicare Other

## 2023-01-14 ENCOUNTER — Inpatient Hospital Stay (HOSPITAL_BASED_OUTPATIENT_CLINIC_OR_DEPARTMENT_OTHER): Payer: Medicare Other | Admitting: Nurse Practitioner

## 2023-01-14 ENCOUNTER — Encounter: Payer: Self-pay | Admitting: Internal Medicine

## 2023-01-14 ENCOUNTER — Encounter: Payer: Self-pay | Admitting: Nurse Practitioner

## 2023-01-14 ENCOUNTER — Other Ambulatory Visit (HOSPITAL_COMMUNITY): Payer: Self-pay

## 2023-01-14 ENCOUNTER — Telehealth: Payer: Self-pay | Admitting: *Deleted

## 2023-01-14 VITALS — BP 131/65 | HR 90 | Temp 98.1°F | Resp 17 | Ht 71.0 in | Wt 182.4 lb

## 2023-01-14 DIAGNOSIS — F419 Anxiety disorder, unspecified: Secondary | ICD-10-CM

## 2023-01-14 DIAGNOSIS — R530 Neoplastic (malignant) related fatigue: Secondary | ICD-10-CM

## 2023-01-14 DIAGNOSIS — C3411 Malignant neoplasm of upper lobe, right bronchus or lung: Secondary | ICD-10-CM | POA: Diagnosis not present

## 2023-01-14 DIAGNOSIS — C3491 Malignant neoplasm of unspecified part of right bronchus or lung: Secondary | ICD-10-CM

## 2023-01-14 DIAGNOSIS — R197 Diarrhea, unspecified: Secondary | ICD-10-CM | POA: Diagnosis not present

## 2023-01-14 DIAGNOSIS — R110 Nausea: Secondary | ICD-10-CM

## 2023-01-14 DIAGNOSIS — T451X5A Adverse effect of antineoplastic and immunosuppressive drugs, initial encounter: Secondary | ICD-10-CM

## 2023-01-14 DIAGNOSIS — G893 Neoplasm related pain (acute) (chronic): Secondary | ICD-10-CM

## 2023-01-14 DIAGNOSIS — R634 Abnormal weight loss: Secondary | ICD-10-CM

## 2023-01-14 DIAGNOSIS — F32A Depression, unspecified: Secondary | ICD-10-CM | POA: Diagnosis not present

## 2023-01-14 DIAGNOSIS — D6481 Anemia due to antineoplastic chemotherapy: Secondary | ICD-10-CM

## 2023-01-14 DIAGNOSIS — K5900 Constipation, unspecified: Secondary | ICD-10-CM

## 2023-01-14 DIAGNOSIS — M792 Neuralgia and neuritis, unspecified: Secondary | ICD-10-CM

## 2023-01-14 DIAGNOSIS — Z7189 Other specified counseling: Secondary | ICD-10-CM

## 2023-01-14 DIAGNOSIS — Z515 Encounter for palliative care: Secondary | ICD-10-CM

## 2023-01-14 DIAGNOSIS — G4709 Other insomnia: Secondary | ICD-10-CM

## 2023-01-14 LAB — CMP (CANCER CENTER ONLY)
ALT: 11 U/L (ref 0–44)
AST: 15 U/L (ref 15–41)
Albumin: 3.3 g/dL — ABNORMAL LOW (ref 3.5–5.0)
Alkaline Phosphatase: 91 U/L (ref 38–126)
Anion gap: 8 (ref 5–15)
BUN: 10 mg/dL (ref 8–23)
CO2: 28 mmol/L (ref 22–32)
Calcium: 8.3 mg/dL — ABNORMAL LOW (ref 8.9–10.3)
Chloride: 101 mmol/L (ref 98–111)
Creatinine: 0.88 mg/dL (ref 0.61–1.24)
GFR, Estimated: >60 mL/min (ref >60)
Glucose, Bld: 101 mg/dL — ABNORMAL HIGH (ref 70–99)
Potassium: 4.2 mmol/L (ref 3.5–5.1)
Sodium: 137 mmol/L (ref 135–145)
Total Bilirubin: 0.4 mg/dL (ref 0.3–1.2)
Total Protein: 5.7 g/dL — ABNORMAL LOW (ref 6.5–8.1)

## 2023-01-14 LAB — CBC WITH DIFFERENTIAL (CANCER CENTER ONLY)
Abs Immature Granulocytes: 0.03 10*3/uL (ref 0.00–0.07)
Basophils Absolute: 0 10*3/uL (ref 0.0–0.1)
Basophils Relative: 0 %
Eosinophils Absolute: 0 10*3/uL (ref 0.0–0.5)
Eosinophils Relative: 0 %
HCT: 26.9 % — ABNORMAL LOW (ref 39.0–52.0)
Hemoglobin: 8.6 g/dL — ABNORMAL LOW (ref 13.0–17.0)
Immature Granulocytes: 1 %
Lymphocytes Relative: 8 %
Lymphs Abs: 0.5 10*3/uL — ABNORMAL LOW (ref 0.7–4.0)
MCH: 27.4 pg (ref 26.0–34.0)
MCHC: 32 g/dL (ref 30.0–36.0)
MCV: 85.7 fL (ref 80.0–100.0)
Monocytes Absolute: 0.6 10*3/uL (ref 0.1–1.0)
Monocytes Relative: 10 %
Neutro Abs: 4.9 10*3/uL (ref 1.7–7.7)
Neutrophils Relative %: 81 %
Platelet Count: 382 10*3/uL (ref 150–400)
RBC: 3.14 MIL/uL — ABNORMAL LOW (ref 4.22–5.81)
RDW: 17.7 % — ABNORMAL HIGH (ref 11.5–15.5)
WBC Count: 6.1 10*3/uL (ref 4.0–10.5)
nRBC: 0 % (ref 0.0–0.2)

## 2023-01-14 LAB — SAMPLE TO BLOOD BANK

## 2023-01-14 LAB — TSH: TSH: 0.647 u[IU]/mL (ref 0.350–4.500)

## 2023-01-14 MED ORDER — INTEGRA PLUS PO CAPS
1.0000 | ORAL_CAPSULE | Freq: Every morning | ORAL | 2 refills | Status: DC
  Filled 2023-01-14: qty 30, 30d supply, fill #0

## 2023-01-14 MED ORDER — DIPHENOXYLATE-ATROPINE 2.5-0.025 MG PO TABS
1.0000 | ORAL_TABLET | Freq: Four times a day (QID) | ORAL | 0 refills | Status: DC | PRN
  Filled 2023-01-14: qty 60, 15d supply, fill #0

## 2023-01-14 MED ORDER — CYANOCOBALAMIN 1000 MCG/ML IJ SOLN
1000.0000 ug | Freq: Once | INTRAMUSCULAR | Status: AC
  Administered 2023-01-14: 1000 ug via INTRAMUSCULAR
  Filled 2023-01-14: qty 1

## 2023-01-14 MED ORDER — SODIUM CHLORIDE 0.9 % IV SOLN
200.0000 mg | Freq: Once | INTRAVENOUS | Status: AC
  Administered 2023-01-14: 200 mg via INTRAVENOUS
  Filled 2023-01-14: qty 8

## 2023-01-14 MED ORDER — SODIUM CHLORIDE 0.9 % IV SOLN
400.0000 mg/m2 | Freq: Once | INTRAVENOUS | Status: AC
  Administered 2023-01-14: 800 mg via INTRAVENOUS
  Filled 2023-01-14: qty 20

## 2023-01-14 MED ORDER — SODIUM CHLORIDE 0.9 % IV SOLN: Freq: Once | INTRAVENOUS | Status: AC

## 2023-01-14 MED ORDER — PALONOSETRON HCL INJECTION 0.25 MG/5ML
0.2500 mg | Freq: Once | INTRAVENOUS | Status: AC
  Administered 2023-01-14: 0.25 mg via INTRAVENOUS
  Filled 2023-01-14: qty 5

## 2023-01-14 MED ORDER — PREDNISONE 10 MG PO TABS
10.0000 mg | ORAL_TABLET | Freq: Every morning | ORAL | 0 refills | Status: DC
  Filled 2023-01-14: qty 30, 30d supply, fill #0

## 2023-01-14 MED ORDER — SODIUM CHLORIDE 0.9 % IV SOLN
10.0000 mg | Freq: Once | INTRAVENOUS | Status: AC
  Administered 2023-01-14: 10 mg via INTRAVENOUS
  Filled 2023-01-14: qty 10

## 2023-01-14 MED ORDER — SODIUM CHLORIDE 0.9 % IV SOLN
458.0000 mg | Freq: Once | INTRAVENOUS | Status: AC
  Administered 2023-01-14: 450 mg via INTRAVENOUS
  Filled 2023-01-14: qty 45

## 2023-01-14 MED ORDER — SODIUM CHLORIDE 0.9 % IV SOLN
150.0000 mg | Freq: Once | INTRAVENOUS | Status: AC
  Administered 2023-01-14: 150 mg via INTRAVENOUS
  Filled 2023-01-14: qty 150

## 2023-01-14 NOTE — Progress Notes (Signed)
Ok to adjust alimta dose based on today's weight per [place], [place]

## 2023-01-14 NOTE — Patient Instructions (Signed)
[facility]  Discharge Instructions: Thank you for choosing [facility] to provide your oncology and hematology care.   If you have a lab appointment with the [facility], please go directly to the [facility] and check in at the registration area.   Wear comfortable clothing and clothing appropriate for easy access to any Portacath or PICC line.   We strive to give you quality time with your provider. You may need to reschedule your appointment if you arrive late (15 or more minutes).  Arriving late affects you and other patients whose appointments are after yours.  Also, if you miss three or more appointments without notifying the office, you may be dismissed from the clinic at the provider's discretion.      For prescription refill requests, have your pharmacy contact our office and allow 72 hours for refills to be completed.    Today you received the following chemotherapy and/or immunotherapy agents keytruda, alimta, carboplatin      To help prevent nausea and vomiting after your treatment, we encourage you to take your nausea medication as directed.  BELOW ARE SYMPTOMS THAT SHOULD BE REPORTED IMMEDIATELY: *FEVER GREATER THAN 100.4 F (38 C) OR HIGHER *CHILLS OR SWEATING *NAUSEA AND VOMITING THAT IS NOT CONTROLLED WITH YOUR NAUSEA MEDICATION *UNUSUAL SHORTNESS OF BREATH *UNUSUAL BRUISING OR BLEEDING *URINARY PROBLEMS (pain or burning when urinating, or frequent urination) *BOWEL PROBLEMS (unusual diarrhea, constipation, pain near the anus) TENDERNESS IN MOUTH AND THROAT WITH OR WITHOUT PRESENCE OF ULCERS (sore throat, sores in mouth, or a toothache) UNUSUAL RASH, SWELLING OR PAIN  UNUSUAL VAGINAL DISCHARGE OR ITCHING   Items with * indicate a potential emergency and should be followed up as soon as possible or go to the Emergency Department if any problems should occur.  Please show the CHEMOTHERAPY ALERT CARD or IMMUNOTHERAPY  ALERT CARD at check-in to the Emergency Department and triage nurse.  Should you have questions after your visit or need to cancel or reschedule your appointment, please contact [facility]  Dept: [phone]  and follow the prompts.  Office hours are 8:00 a.m. to 4:30 p.m. Monday - Friday. Please note that voicemails left after 4:00 p.m. may not be returned until the following business day.  We are closed weekends and major holidays. You have access to a nurse at all times for urgent questions. Please call the main number to the clinic Dept: [phone] and follow the prompts.   For any non-urgent questions, you may also contact your provider using MyChart. We now offer e-Visits for anyone 29 and older to request care online for non-urgent symptoms. For details visit [email or web address].   Also download the MyChart app! Go to the app store, search "MyChart", open the app, select [facility], and log in with your MyChart username and password.

## 2023-01-14 NOTE — Telephone Encounter (Signed)
Pt has appointment tomorrow at 1315 mychart VV. For cpap follow up.

## 2023-01-14 NOTE — Progress Notes (Signed)
Patient seen by PA today  Vitals are within treatment parameters.  Labs reviewed: and are within treatment parameters.  Per physician team, patient is ready for treatment and there are NO modifications to the treatment plan.

## 2023-01-14 NOTE — Progress Notes (Signed)
Thomas [name]  [phone]) [phone] [phone]) [phone]   Name: Thomas [name] Date: 01/14/2023 MRN: [id number]  DOB: Jun 14, 1951  Patient Care Team: Patient, No Pcp Per as PCP - General ([facility]) Park Liter, MD as PCP - Cardiology (Cardiology) Pickenpack-Cousar, Carlena Sax, NP as Nurse Practitioner (Nurse Practitioner)     INTERVAL HISTORY: Thomas [name] is a 72 y.o. male with oncologic medical history including right iliac lytic lesion, right upper lob nodule, right paratracheal nodule, and right hilar and mediastinal adenopathy. He is currently receiving Keytruda. Palliative asked to see for symptom management and goals of care.   SOCIAL HISTORY:     reports that he quit smoking about 28 years ago. His smoking use included cigarettes. He has a 6.00 pack-year smoking history. He has never used smokeless tobacco. He reports that he does not currently use alcohol. He reports that he does not use drugs.  ADVANCE DIRECTIVES: none   CODE STATUS: Full Code  PAST MEDICAL HISTORY: Past Medical History:  Diagnosis Date   Abdominal wall pain 04/02/2021   Acute ischemic stroke ([facility]) 03/22/2020   Avulsed toenail, initial encounter 05/04/2018   Dyspnea on exertion 02/10/2020   ED (erectile dysfunction)    Epidermoid cyst of neck 10/16/2017   Essential hypertension 07/21/2017   GERD (gastroesophageal reflux disease)    Gout of multiple sites 07/21/2017   History of kidney stones    History of radiation therapy    Right Chest, Right Pelvis- 10/30/22-11/13/22- Sr. 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  MEDICATIONS:  Current Outpatient Medications  Medication Sig Dispense Refill   acetaminophen (TYLENOL) 650 MG CR tablet Take 1,300 mg by mouth every 8 (eight) hours.     allopurinol (ZYLOPRIM) 300 MG tablet Take 300 mg by mouth in the morning.     amLODipine (NORVASC) 5 MG tablet Take 1 tablet (5 mg total) by mouth daily. (Patient taking differently: Take 5 mg by mouth in the morning.) 190 tablet 3   CLARITIN 10 MG tablet Take 10 mg by mouth in the morning.     diphenoxylate-atropine (LOMOTIL) 2.5-0.025 MG tablet Take 1 tablet by mouth every 4 (four) hours as needed for diarrhea or loose stools. Take 2 tablets after the first stool, then take as directed. (Patient not taking: Reported on 12/12/2022) 60 tablet 0   fentaNYL (DURAGESIC) 50 MCG/HR Place 1 patch onto the skin every 3 days. 10 patch 0   folic acid (FOLVITE) 1 MG tablet Take 1 tablet (1 mg total) by mouth daily. 30 tablet 4   gabapentin (NEURONTIN) 300 MG capsule Take 600 mg by mouth every evening.     HYDROmorphone (DILAUDID) 8 MG tablet Take 1 tablet (8 mg total) by mouth every 4 (four) hours as needed for severe pain. 90 tablet 0   IMODIUM A-D 2 MG tablet  Take 2 mg by mouth every 4 (four) hours as needed for diarrhea or loose stools.     lidocaine-prilocaine (EMLA) cream Apply to the Port-A-Cath site 30-60 minutes before treatment 30 g 0   minoxidil (LONITEN) 10 MG tablet Take 1-1.5 tablets (10-15 mg total) by mouth 2 (two) times daily. TAKE 1 TABLET IN THE MORNING AND TAKE 1/2 TABLET AT NIGHT (Patient taking differently: Take 10 mg by mouth See admin instructions. Take 10 mg by mouth in the morning and evening) 135 tablet 1   ondansetron (ZOFRAN-ODT) 8 MG disintegrating tablet Take 1 tablet (8  mg total) by mouth every 8 (eight) hours as needed for nausea or vomiting. 30 tablet 2   pantoprazole (PROTONIX) 40 MG tablet Take 1 tablet (40 mg total) by mouth 2 (two) times daily. (Patient taking differently: Take 40 mg by mouth 2 (two) times daily before a meal.) 60 tablet 2   polyethylene glycol (MIRALAX / GLYCOLAX) 17 g packet Take 17 g by mouth 2 (two) times daily. (Patient taking differently: Take 17 g by mouth 2 (two) times daily as needed for mild constipation.) 14 each 0   PRESCRIPTION MEDICATION Apply 1 Application topically See admin instructions. Compounded pain cream ([facility])- apply to the feet twice a day as needed for pain     PRESCRIPTION MEDICATION CPAP- At bedtime     prochlorperazine (COMPAZINE) 10 MG tablet Take 1 tablet (10 mg total) by mouth every 6 (six) hours as needed for nausea or vomiting. 30 tablet 0   senna (SENOKOT) 8.6 MG TABS tablet Take 2 tablets (17.2 mg total) by mouth 2 (two) times daily. (Patient not taking: Reported on 12/12/2022) 120 tablet 6   valsartan (DIOVAN) 320 MG tablet TAKE 1 TABLET BY MOUTH EVERY DAY (Patient taking differently: Take 320 mg by mouth in the morning.) 90 tablet 3   No current facility-administered medications for this visit.    VITAL SIGNS: There were no vitals taken for this visit. There were no vitals filed for this visit.  Estimated body mass index is 26.81 kg/m as calculated from the following:   Height as of 12/12/22: '5\' 11"'$  (1.803 m).   Weight as of 12/25/22: 192 lb 3.2 oz (87.2 kg).   PERFORMANCE STATUS (ECOG) : 1 - Symptomatic but completely ambulatory  Assessment NAD, lying in hospital bed in infusion area RRR Normal breathing pattern AAO x3, mood appropriate   IMPRESSION: I saw patient during his infusion today. Present for visit are patient and his wife. Patient is alert, oriented x 4, and appropriate. He is somewhat resigned as compared with previous visit. He fatigues easily during conversation and  frequently defers to his wife for help.  Neoplasm related pain  Thomas [name] reports consistently experiencing moderate to severe pain. He describes the pain to his right abdomen and chest as aching and muscular in nature. Since the tapering off of his steroid, patient reports that pain has intensified. Reports at its worst, pain is 9 out of 10 and keeps him awake. Shares one night this past week he had contemplated going to the hospital due to the severity.  Steroid was restarted today by Oncology at a lower maintenance dose to help with pain management.  Thomas [name] confirms taking Gabapentin 600-'900mg'$  PO at hs. Discussed synergistic effects of Gabapentin and patient agreeable to start taking '900mg'$  on a consistent basis. Discussed daytime dosing. He will start taking '300mg'$  in the morning for additional support as he endorses increased neuropathic discomfort  in hands and feet. Patient reports that Gabapentin makes him sleepy and he would not have ordinarily wanted to take during the day due to trying to continue work. Reports that he is now on leave to better focus on his health and feels it will now be okay to take during the daytime. Also discussed with use of prednisone may not notice increased drowsiness.   Begin Gabapentin '300mg'$  PO q am. Reinforce use of Gabapentin '900mg'$  PO q hs.  No changes made to Fentanyl patch or PRN Dilaudid at this time. We will continue to closely monitor and make safe adjustments as needed.   2.   Nausea Thomas [name] endorses nausea that makes it difficult to eat. His preference is for Zofran and has not consistently utilized Compazine. Education provided regarding what alternation of the two medications could look like - that he does not have to sacrifice taking the Zofran in order to take the Compazine.  Patient agrees to try alternating the Zofran and Compazine for nausea relief.  3.  Decreased appetite/weight loss Discussed Thomas [name] decline in appetite and he  expresses concern for his weight loss. Wife states that there are many items (ie supplements) at home for patient to use but he has not tried many of them. Educated regarding the benefits of supplement drinks.   Education provided on the use of lemon juice, lemon-flavored candies, slices, and ginger to curb nausea and stimulate appetite. Lemon candies brought to bedside for patient to try.  4.  Diarrhea/constipation Patient reports fluctuating between diarrhea and constipation. Diarrhea most frequently occurs after receiving his treatment. Constipation occurs after having taken antidiarrheal for a few days.  Reassurance and instructions provided to patient on use of Imodium and Lomotil for episodes of diarrhea. Instructed that patient begin taking Lomotil after 3 loose stools (if Imodium has not worked). Advised to stop taking antidiarrheals when diarrhea has ended and allow a "rest" day in-between before taking Metamucil for constipation.  5.    Goals of care/coping Thomas [name] reports that his two visits with Dr. Binnie Kand were very helpful. Would like to continue these end-of-life conversations with palliative team with social work involvement as indicated. Thomas [name] is aware that visits can be conducted alone or with family if he is desiring for them to receive emotional support. Patient expresses concern for his wife. States, "I want to outline a path from here to death..I don't want there to be questions about what to do... or decisions to be made under duress."    Palliative to arrange visits to help patient work through grief and articulate wishes regarding end-of-life.   PLAN: Fentanyl 50 mcg patch.   Hydromorphone 8 mg as needed for breakthrough pain.  Support daily low-dose steroid as per oncology.  Begin Gabapentin 300 mg in am. Reinforce use of Gabapentin 900 mg at bedtime.  Education provided on pain regimen and frequency. Alternate PRN Zofran '4mg'$  and Compazine '10mg'$  for  management of nausea. Use Imodium, Lomotil, and Metamucil effectively to manage bowel patterns. We will continue to closely monitor and adjust medications as needed. Arrange visit within the next 1-2 weeks to specifically address end-of-life wishes and provide emotional support. Plan to follow-up within next week regarding pain and nausea management.  Patient expressed understanding and was in agreement with this plan. He also understands that He can call the clinic at any time with any questions, concerns, or complaints.      Any controlled substances utilized were prescribed in the context  of palliative care. PDMP has been reviewed.   Time Total: 45 min   I assessed patient with Levada Dy, NP Student. Agree with above findings.   Visit consisted of counseling and education dealing with the complex and emotionally intense issues of symptom management and palliative care in the setting of serious and potentially life-threatening illness.Greater than 50%  of this time was spent counseling and coordinating care related to the above assessment and plan.  Signed by: Levada Dy [place] RN  Alda Lea, AGPCNP-BC  Palliative Medicine Team/[facility]

## 2023-01-15 ENCOUNTER — Encounter: Payer: Self-pay | Admitting: Internal Medicine

## 2023-01-15 ENCOUNTER — Telehealth: Payer: Self-pay | Admitting: Neurology

## 2023-01-15 ENCOUNTER — Other Ambulatory Visit (HOSPITAL_COMMUNITY): Payer: Self-pay

## 2023-01-15 ENCOUNTER — Telehealth (INDEPENDENT_AMBULATORY_CARE_PROVIDER_SITE_OTHER): Payer: Medicare Other | Admitting: Neurology

## 2023-01-15 DIAGNOSIS — G4733 Obstructive sleep apnea (adult) (pediatric): Secondary | ICD-10-CM | POA: Diagnosis not present

## 2023-01-15 DIAGNOSIS — Z789 Other specified health status: Secondary | ICD-10-CM | POA: Diagnosis not present

## 2023-01-15 NOTE — Telephone Encounter (Signed)
Sent mychart msg informing pt of follow up appointment made with Janett Billow per Dr. Rexene Alberts

## 2023-01-15 NOTE — Progress Notes (Signed)
Interim history:   Mr. Hamper is a 72 year old right-handed gentleman with an underlying medical history of hypertension, hyperlipidemia, left thalamic stroke, gout, kidney stones, squamous cell skin cancer, chronic pain, and borderline obesity, who presents for a MyChart Video visit for follow-up consultation of his obstructive sleep apnea, on AutoPap therapy.  The patient is unaccompanied today. He is located at his home, using his phone for this visit.  I am located in my office at [facility] using my laptop computer.  I last saw him on 01/09/2022, at which time he reported doing well with his AutoPap machine.  He was compliant with it and apnea was under good control.  He was advised to continue with AutoPap therapy at the current settings and follow-up routinely in 1 year.  Today, 01/15/2023: I reviewed his AutoPap compliance data from 12/13/2022 through 01/11/2023, which is a total of 30 days, during which time he used his machine 22 days with percent use days greater than 4 hours at 33.3% only, indicating suboptimal compliance, average usage for days on treatment of 3 hours and 36 minutes only.  Residual AHI mildly elevated at 9.3/h, average pressure for the 95th percentile at 7.9 cm with a range of 6 to 12 cm, leak on the low side. He reports doing fairly.  He has had quite a bit of weight loss in the wake of his cancer diagnosis, he was diagnosed with stage IV lung cancer and is currently undergoing chemotherapy.  Within the past year he has lost about 50 pounds.  He has had difficulty with mask fit.  He purchased 2 other masks on his own including a chinstrap to see if he can get his mass to stay put a little better.  Sometimes the pressure feels too high.  He is trying the best he can to use his AutoPap and is motivated to continue with treatment.  Chemotherapy has resulted in nausea and diarrhea.  He does need to sleep more upright because of chest congestion.  He does wake up  with dry mouth.   The patient's allergies, current medications, family history, past medical history, past social history, past surgical history and problem list were reviewed and updated as appropriate.    Previously:   I saw him on 06/11/2021 for re-evaluation of his obstructive sleep apnea concerns.  We proceeded with a home sleep test on 07/29/2021 which indicated severe obstructive sleep apnea with an AHI of 35.3/h, O2 nadir 76% with mild to moderate snoring detected.  He was advised to start AutoPap therapy. Set up was 10/15/2021.  He has a Personnel officer.  I reviewed his autoPAP compliance data from 10/15/2021 through 11/13/2021, which is a total of 30 days, during which time he used his machine every night with percent use days greater than 4 hours at 100%, indicating superb compliance with an average usage out of 7 hours and 5 minutes, residual AHI at goal at 1.5/h, leak variable, average leak at 15.5 L/min, no significant central events, average pressure for the 95th percentile at 7.8 cm with a range of 6 to 12 cm.  I reviewed his most recent month as well from 12/09/2021 through 01/07/2022, he was at 100% compliant with an average usage of 6 hours and 51 minutes, average pressure for the 95th percentile right at 8 cm, average leak 12.1 L/min, average AHI 1.9/h.      I first met him at the request of Frann Rider, NP and Dr. Leonie Man on 05/28/2020 at  which time he reported snoring and witnessed apneas per wife's report.  He was advised to proceed with a sleep study.  He did not pursue testing at the time.    05/28/20: (He) reports snoring and witnessed apneas per wife's report.  He has had difficulty control hypertension for a few years.  He also suffers from chronic gout and intermittent flareup of his gout.  His father had sleep apnea and used a CPAP machine in his 59s.  I reviewed your office note from 04/24/2020.  His Epworth sleepiness score is 3 out of 24 today, fatigue severity score is 27 out of  63. Patient lives with his wife, he works as Engineer, site for a private school.  He smoked off and on in his 37s, quit 35 years ago.  He has 2 grown children, 63 year old son is in college, he has a 60 year old as well.  He has nocturia about 2-3 times per average night, denies recurrent morning headaches.  He has a bedtime of around 10, rise time around 5.  He drinks caffeine in the form of coffee, 2 cups/day on average in the mornings, no alcohol in the recent past.  He is still in physical therapy.  His strength is fairly good on the right side, still has some sensory symptoms particularly in the right leg. He is a side sleeper.  They do have a TV in the bedroom but he does not have to watch it at night, likes to read before falling asleep.  They have a dog in the household.    His Past Medical History Is Significant For: Past Medical History:  Diagnosis Date   Abdominal wall pain 04/02/2021   Acute ischemic stroke ([place]) 03/22/2020   Avulsed toenail, initial encounter 05/04/2018   Dyspnea on exertion 02/10/2020   ED (erectile dysfunction)    Epidermoid cyst of neck 10/16/2017   Essential hypertension 07/21/2017   GERD (gastroesophageal reflux disease)    Gout of multiple sites 07/21/2017   History of kidney stones    History of radiation therapy    Right Chest, Right Pelvis- 10/30/22-11/13/22- Sr. 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   Urgency of urination    URI (upper respiratory infection) 11/08/2014   Wears glasses     His Past Surgical  History Is Significant For: Past Surgical History:  Procedure Laterality Date   COLONOSCOPY WITH PROPOFOL  2017   CYSTOSCOPY/RETROGRADE/URETEROSCOPY/STONE EXTRACTION WITH BASKET Left 04/16/2018   Procedure: CYSTOSCOPY/RETROGRADE/URETEROSCOPY/STONE EXTRACTION WITH BASKET/ HOLMIUM LASER LITHOTRIPSY/ STENT PLACEMENT;  Surgeon: Ardis Hughs, MD;  Location: [facility];  Service: Urology;  Laterality: Left;   HERNIA REPAIR Right 2022   inguinal   HOLMIUM LASER APPLICATION Left [id number]   Procedure: HOLMIUM LASER APPLICATION;  Surgeon: Ardis Hughs, MD;  Location: [facility];  Service: Urology;  Laterality: Left;   TOTAL HIP ARTHROPLASTY Right 06/30/2022   Procedure: RIGHT TOTAL HIP ARTHROPLASTY ANTERIOR APPROACH;  Surgeon: Leandrew Koyanagi, MD;  Location: [place];  Service: Orthopedics;  Laterality: Right;  3-C    His Family History Is Significant For: Family History  Problem Relation Age of Onset   Heart disease Mother 1   Alcohol abuse Mother    Depression Mother    Hypertension Father    Sleep apnea Father    Cancer Sister        hodgkins, breast   Colon cancer Neg Hx    Pancreatic cancer Neg Hx    Rectal cancer Neg Hx    Stomach cancer Neg Hx    Esophageal cancer Neg Hx    Inflammatory bowel disease Neg Hx    Liver disease Neg Hx     His Social History Is Significant For: Social History   Socioeconomic History   Marital status: Married    Spouse name: [name]   Number of children: 2   Years of education: Not on file   Highest education level: Not on file  Occupational History   Occupation: Pharmacist, hospital  Tobacco Use   Smoking status: Former    Packs/day: 0.30    Years: 20.00    Total pack years: 6.00    Types: Cigarettes    Quit date: 10/17/1994    Years since quitting: 28.2   Smokeless tobacco: Never  Vaping Use   Vaping Use: Not on file  Substance and Sexual Activity   Alcohol use: Not Currently   Drug use: No   Sexual  activity: Yes  Other Topics Concern   Not on file  Social History Narrative   Exercise 1 hour a day ---3-4 days a week   Social Determinants of Health   Financial Resource Strain: Not on file  Food Insecurity: No Food Insecurity (12/12/2022)   Hunger Vital Sign    Worried About Running Out of Food in the Last Year: Never true    Ran Out of Food in the Last Year: Never true  Transportation Needs: No Transportation Needs (12/12/2022)   PRAPARE - Hydrologist (Medical): No    Lack of Transportation (Non-Medical): No  Physical Activity: Not on file  Stress: Not on file  Social Connections: Not on file    His Allergies Are:  Allergies  Allergen Reactions   Statins Other (See Comments)    Jaw tightness and severe muscle pain- Pravastatin   :   His Current Medications Are:  Outpatient Encounter Medications as of 01/15/2023  Medication Sig   acetaminophen (TYLENOL) 650 MG CR tablet Take 1,300 mg by mouth every 8 (eight) hours.   allopurinol (ZYLOPRIM) 300 MG tablet Take 300 mg by mouth in the morning.   amLODipine (NORVASC) 5 MG tablet Take 1 tablet (5 mg total) by mouth daily. (Patient taking differently: Take 5 mg by mouth in the morning.)   CLARITIN 10 MG tablet Take 10 mg by mouth in the morning.   diphenoxylate-atropine (LOMOTIL) 2.5-0.025 MG tablet Take 1 tablet by mouth 4 (four) times daily as needed for diarrhea or loose stools. Take 2 tablets after the first stool, then take as directed.   FeFum-FePoly-FA-B Cmp-C-Biot (INTEGRA PLUS) CAPS Take 1 capsule by mouth every morning.   fentaNYL (DURAGESIC) 50 MCG/HR Place 1 patch onto the skin every 3 days.   folic acid (FOLVITE) 1 MG tablet Take 1 tablet (1 mg total) by mouth daily.   gabapentin (NEURONTIN) 300 MG capsule Take 600 mg by mouth every evening.   HYDROmorphone (DILAUDID) 8 MG tablet Take 1 tablet (8 mg total) by mouth every 4 (four) hours as  needed for severe pain.   IMODIUM A-D 2 MG tablet  Take 2 mg by mouth every 4 (four) hours as needed for diarrhea or loose stools.   lidocaine-prilocaine (EMLA) cream Apply to the Port-A-Cath site 30-60 minutes before treatment   minoxidil (LONITEN) 10 MG tablet Take 1-1.5 tablets (10-15 mg total) by mouth 2 (two) times daily. TAKE 1 TABLET IN THE MORNING AND TAKE 1/2 TABLET AT NIGHT (Patient taking differently: Take 10 mg by mouth See admin instructions. Take 10 mg by mouth in the morning and evening)   ondansetron (ZOFRAN-ODT) 8 MG disintegrating tablet Take 1 tablet (8 mg total) by mouth every 8 (eight) hours as needed for nausea or vomiting.   pantoprazole (PROTONIX) 40 MG tablet Take 1 tablet (40 mg total) by mouth 2 (two) times daily. (Patient taking differently: Take 40 mg by mouth 2 (two) times daily before a meal.)   polyethylene glycol (MIRALAX / GLYCOLAX) 17 g packet Take 17 g by mouth 2 (two) times daily. (Patient taking differently: Take 17 g by mouth 2 (two) times daily as needed for mild constipation.)   predniSONE (DELTASONE) 10 MG tablet Take 1 tablet (10 mg total) by mouth every morning.   PRESCRIPTION MEDICATION Apply 1 Application topically See admin instructions. Compounded pain cream ([place])- apply to the feet twice a day as needed for pain   PRESCRIPTION MEDICATION CPAP- At bedtime   prochlorperazine (COMPAZINE) 10 MG tablet Take 1 tablet (10 mg total) by mouth every 6 (six) hours as needed for nausea or vomiting.   senna (SENOKOT) 8.6 MG TABS tablet Take 2 tablets (17.2 mg total) by mouth 2 (two) times daily. (Patient not taking: Reported on 12/12/2022)   valsartan (DIOVAN) 320 MG tablet TAKE 1 TABLET BY MOUTH EVERY DAY (Patient taking differently: Take 320 mg by mouth in the morning.)   No facility-administered encounter medications on file as of 01/15/2023.  :  Review of Systems:  Out of a complete 14 point review of systems, all are reviewed and negative with the exception of these symptoms as listed  below:  Virtual Visit via Video Note on 01/15/2023:   I connected with Wayne Both on 01/15/23 at  1:15 PM EST by a video enabled telemedicine application and verified that I am speaking with the correct person using two identifiers.   I discussed the limitations of evaluation and management by telemedicine and the availability of in person appointments. The patient expressed understanding and agreed to proceed.  History of Present Illness:  See above.   Observations/Objective: Pleasant, conversant, no acute distress.  Has lost weight which is also visible in his facial structure.  Corrective eyeglasses in place, normal upper body movements, able to speak in full sentences.  Assessment and Plan: In summary, LACARLOS RESPASS is a very pleasant 72 year old male with an underlying medical history of hypertension, hyperlipidemia, left thalamic stroke, gout, kidney stones, squamous cell skin cancer, chronic pain, weight loss, recent diagnosis of metastatic lung cancer, who presents for virtual visit follow-up of his obstructive sleep apnea.  He has had significant weight loss in the past year.  He was diagnosed with severe obstructive sleep apnea with a home sleep test on 07/29/2021 which showed an AHI of 35.3/h, O2 nadir was 76%, with mild to moderate snoring detected.  He has been on autoPap since 10/15/2021.  He is currently not fully compliant with treatment due to several issues including difficulty with chest congestion, having to sleep more  upright, mask fit issues.  We talked about these challenges, and he is encouraged to do the best he can.  I will reduce his pressure from currently 6 to 12 cm to 5 to 9 cm to see if his air leak issues and mouth dryness improves a little bit.  He is advised to follow-up in a virtual visit with Frann Rider, NP in about 6 months.  He is currently undergoing chemotherapy.  I answered all his questions today and he was in agreement with our plan.   Follow Up  Instructions:     I discussed the assessment and treatment plan with the patient. The patient was provided an opportunity to ask questions and all were answered. The patient agreed with the plan and demonstrated an understanding of the instructions.   The patient was advised to call back or seek an in-person evaluation if the symptoms worsen or if the condition fails to improve as anticipated.    I provided 20 minutes of non-face-to-face time during this encounter.   Star Age, MD

## 2023-01-15 NOTE — Patient Instructions (Addendum)
Continue using your AutoPap as best as possible.  Follow up with NP in 6 mon, we can do a video visit again.  I will reduce your pressure settings to 5 to 9 cm from currently 6 to 12 cm.

## 2023-01-16 ENCOUNTER — Inpatient Hospital Stay: Payer: Medicare Other | Attending: Internal Medicine

## 2023-01-16 ENCOUNTER — Inpatient Hospital Stay: Payer: Medicare Other

## 2023-01-16 ENCOUNTER — Inpatient Hospital Stay: Payer: Medicare Other | Admitting: Licensed Clinical Social Worker

## 2023-01-16 ENCOUNTER — Ambulatory Visit: Payer: Medicare Other

## 2023-01-16 ENCOUNTER — Telehealth: Payer: Self-pay | Admitting: Internal Medicine

## 2023-01-16 ENCOUNTER — Telehealth: Payer: Self-pay | Admitting: *Deleted

## 2023-01-16 VITALS — BP 160/60 | HR 89 | Temp 98.1°F | Resp 18

## 2023-01-16 DIAGNOSIS — Z8673 Personal history of transient ischemic attack (TIA), and cerebral infarction without residual deficits: Secondary | ICD-10-CM | POA: Diagnosis not present

## 2023-01-16 DIAGNOSIS — Z79899 Other long term (current) drug therapy: Secondary | ICD-10-CM | POA: Diagnosis not present

## 2023-01-16 DIAGNOSIS — R601 Generalized edema: Secondary | ICD-10-CM | POA: Insufficient documentation

## 2023-01-16 DIAGNOSIS — G893 Neoplasm related pain (acute) (chronic): Secondary | ICD-10-CM | POA: Insufficient documentation

## 2023-01-16 DIAGNOSIS — R110 Nausea: Secondary | ICD-10-CM | POA: Insufficient documentation

## 2023-01-16 DIAGNOSIS — J90 Pleural effusion, not elsewhere classified: Secondary | ICD-10-CM | POA: Insufficient documentation

## 2023-01-16 DIAGNOSIS — E785 Hyperlipidemia, unspecified: Secondary | ICD-10-CM | POA: Diagnosis not present

## 2023-01-16 DIAGNOSIS — Z923 Personal history of irradiation: Secondary | ICD-10-CM | POA: Insufficient documentation

## 2023-01-16 DIAGNOSIS — C3411 Malignant neoplasm of upper lobe, right bronchus or lung: Secondary | ICD-10-CM | POA: Insufficient documentation

## 2023-01-16 DIAGNOSIS — K649 Unspecified hemorrhoids: Secondary | ICD-10-CM | POA: Insufficient documentation

## 2023-01-16 DIAGNOSIS — C3491 Malignant neoplasm of unspecified part of right bronchus or lung: Secondary | ICD-10-CM

## 2023-01-16 DIAGNOSIS — Z87442 Personal history of urinary calculi: Secondary | ICD-10-CM | POA: Insufficient documentation

## 2023-01-16 DIAGNOSIS — K5900 Constipation, unspecified: Secondary | ICD-10-CM | POA: Insufficient documentation

## 2023-01-16 DIAGNOSIS — C7970 Secondary malignant neoplasm of unspecified adrenal gland: Secondary | ICD-10-CM | POA: Diagnosis not present

## 2023-01-16 DIAGNOSIS — Z87891 Personal history of nicotine dependence: Secondary | ICD-10-CM | POA: Diagnosis not present

## 2023-01-16 DIAGNOSIS — Z7952 Long term (current) use of systemic steroids: Secondary | ICD-10-CM | POA: Insufficient documentation

## 2023-01-16 DIAGNOSIS — N281 Cyst of kidney, acquired: Secondary | ICD-10-CM | POA: Insufficient documentation

## 2023-01-16 DIAGNOSIS — K219 Gastro-esophageal reflux disease without esophagitis: Secondary | ICD-10-CM | POA: Insufficient documentation

## 2023-01-16 DIAGNOSIS — C782 Secondary malignant neoplasm of pleura: Secondary | ICD-10-CM | POA: Insufficient documentation

## 2023-01-16 DIAGNOSIS — I10 Essential (primary) hypertension: Secondary | ICD-10-CM | POA: Insufficient documentation

## 2023-01-16 LAB — T4: T4, Total: 7.8 ug/dL (ref 4.5–12.0)

## 2023-01-16 MED ORDER — SODIUM CHLORIDE 0.9 % IV SOLN: Freq: Once | INTRAVENOUS | Status: AC

## 2023-01-16 MED ORDER — PEGFILGRASTIM-CBQV 6 MG/0.6ML ~~LOC~~ SOSY
6.0000 mg | PREFILLED_SYRINGE | Freq: Once | SUBCUTANEOUS | Status: AC
  Administered 2023-01-16: 6 mg via SUBCUTANEOUS
  Filled 2023-01-16: qty 0.6

## 2023-01-16 NOTE — Telephone Encounter (Signed)
Autopap pressure change order sent to [facility].

## 2023-01-16 NOTE — Telephone Encounter (Signed)
Contacted patient to scheduled appointments. Patient is aware of appointments that are scheduled.   

## 2023-01-16 NOTE — Telephone Encounter (Signed)
[facility] confirmed receipt of order.

## 2023-01-16 NOTE — Patient Instructions (Signed)

## 2023-01-16 NOTE — Progress Notes (Signed)
[place] CSW Progress Note  Clinical Education officer, museum  received a referral from palliative care to reach out to pt regarding counseling as Dr. Michail Sermon will no longer be seeing pts.  CSW reached out to pt by phone.  Pt was on his way to an appointment, but receptive to setting up a time to meet.  CSW emailed contact information to pt to book an appointment once he has had the opportunity to look at his schedule.        Henriette Combs, LCSW

## 2023-01-19 ENCOUNTER — Encounter: Payer: Self-pay | Admitting: Internal Medicine

## 2023-01-19 ENCOUNTER — Telehealth: Payer: Self-pay | Admitting: *Deleted

## 2023-01-19 ENCOUNTER — Inpatient Hospital Stay: Payer: Medicare Other

## 2023-01-19 DIAGNOSIS — C3491 Malignant neoplasm of unspecified part of right bronchus or lung: Secondary | ICD-10-CM

## 2023-01-19 DIAGNOSIS — C3411 Malignant neoplasm of upper lobe, right bronchus or lung: Secondary | ICD-10-CM | POA: Diagnosis not present

## 2023-01-19 MED ORDER — SODIUM CHLORIDE 0.9 % IV SOLN: Freq: Once | INTRAVENOUS | Status: AC

## 2023-01-19 NOTE — Patient Instructions (Signed)

## 2023-01-19 NOTE — Telephone Encounter (Signed)
[employer] paperwork completed by this nurse at this time.  Folder placed in designated mail bin for collaborative pick up for provider review, signature, and return.

## 2023-01-20 ENCOUNTER — Other Ambulatory Visit: Payer: Self-pay | Admitting: Nurse Practitioner

## 2023-01-20 ENCOUNTER — Other Ambulatory Visit (HOSPITAL_COMMUNITY): Payer: Self-pay

## 2023-01-20 DIAGNOSIS — Z515 Encounter for palliative care: Secondary | ICD-10-CM

## 2023-01-20 DIAGNOSIS — C3490 Malignant neoplasm of unspecified part of unspecified bronchus or lung: Secondary | ICD-10-CM

## 2023-01-20 DIAGNOSIS — M898X5 Other specified disorders of bone, thigh: Secondary | ICD-10-CM

## 2023-01-20 DIAGNOSIS — G893 Neoplasm related pain (acute) (chronic): Secondary | ICD-10-CM

## 2023-01-20 MED ORDER — HYDROMORPHONE HCL 8 MG PO TABS
8.0000 mg | ORAL_TABLET | ORAL | 0 refills | Status: DC | PRN
  Filled 2023-01-22 – 2023-01-23 (×2): qty 90, 15d supply, fill #0

## 2023-01-20 MED ORDER — FENTANYL 50 MCG/HR TD PT72
1.0000 | MEDICATED_PATCH | TRANSDERMAL | 0 refills | Status: DC
  Filled 2023-01-20: qty 10, 30d supply, fill #0

## 2023-01-21 ENCOUNTER — Other Ambulatory Visit: Payer: Medicare Other

## 2023-01-21 ENCOUNTER — Inpatient Hospital Stay: Payer: Medicare Other

## 2023-01-21 ENCOUNTER — Encounter: Payer: Self-pay | Admitting: Nurse Practitioner

## 2023-01-21 ENCOUNTER — Inpatient Hospital Stay (HOSPITAL_BASED_OUTPATIENT_CLINIC_OR_DEPARTMENT_OTHER): Payer: Medicare Other | Admitting: Nurse Practitioner

## 2023-01-21 DIAGNOSIS — G893 Neoplasm related pain (acute) (chronic): Secondary | ICD-10-CM

## 2023-01-21 DIAGNOSIS — R530 Neoplastic (malignant) related fatigue: Secondary | ICD-10-CM

## 2023-01-21 DIAGNOSIS — Z515 Encounter for palliative care: Secondary | ICD-10-CM

## 2023-01-21 DIAGNOSIS — K5900 Constipation, unspecified: Secondary | ICD-10-CM

## 2023-01-21 DIAGNOSIS — C3491 Malignant neoplasm of unspecified part of right bronchus or lung: Secondary | ICD-10-CM

## 2023-01-21 DIAGNOSIS — R110 Nausea: Secondary | ICD-10-CM

## 2023-01-21 DIAGNOSIS — C3411 Malignant neoplasm of upper lobe, right bronchus or lung: Secondary | ICD-10-CM | POA: Diagnosis not present

## 2023-01-21 DIAGNOSIS — Z5111 Encounter for antineoplastic chemotherapy: Secondary | ICD-10-CM

## 2023-01-21 LAB — CMP (CANCER CENTER ONLY)
ALT: 14 U/L (ref 0–44)
AST: 27 U/L (ref 15–41)
Albumin: 3.6 g/dL (ref 3.5–5.0)
Alkaline Phosphatase: 193 U/L — ABNORMAL HIGH (ref 38–126)
Anion gap: 10 (ref 5–15)
BUN: 11 mg/dL (ref 8–23)
CO2: 29 mmol/L (ref 22–32)
Calcium: 8.6 mg/dL — ABNORMAL LOW (ref 8.9–10.3)
Chloride: 99 mmol/L (ref 98–111)
Creatinine: 0.94 mg/dL (ref 0.61–1.24)
GFR, Estimated: >60 mL/min (ref >60)
Glucose, Bld: 158 mg/dL — ABNORMAL HIGH (ref 70–99)
Potassium: 3.9 mmol/L (ref 3.5–5.1)
Sodium: 138 mmol/L (ref 135–145)
Total Bilirubin: 0.4 mg/dL (ref 0.3–1.2)
Total Protein: 6.4 g/dL — ABNORMAL LOW (ref 6.5–8.1)

## 2023-01-21 LAB — CBC WITH DIFFERENTIAL (CANCER CENTER ONLY)
Abs Immature Granulocytes: 1.04 10*3/uL — ABNORMAL HIGH (ref 0.00–0.07)
Basophils Absolute: 0 10*3/uL (ref 0.0–0.1)
Basophils Relative: 0 %
Eosinophils Absolute: 0 10*3/uL (ref 0.0–0.5)
Eosinophils Relative: 0 %
HCT: 27.4 % — ABNORMAL LOW (ref 39.0–52.0)
Hemoglobin: 9 g/dL — ABNORMAL LOW (ref 13.0–17.0)
Immature Granulocytes: 4 %
Lymphocytes Relative: 3 %
Lymphs Abs: 0.6 10*3/uL — ABNORMAL LOW (ref 0.7–4.0)
MCH: 28 pg (ref 26.0–34.0)
MCHC: 32.8 g/dL (ref 30.0–36.0)
MCV: 85.4 fL (ref 80.0–100.0)
Monocytes Absolute: 1.9 10*3/uL — ABNORMAL HIGH (ref 0.1–1.0)
Monocytes Relative: 8 %
Neutro Abs: 20.7 10*3/uL — ABNORMAL HIGH (ref 1.7–7.7)
Neutrophils Relative %: 85 %
Platelet Count: 290 10*3/uL (ref 150–400)
RBC: 3.21 MIL/uL — ABNORMAL LOW (ref 4.22–5.81)
RDW: 18.3 % — ABNORMAL HIGH (ref 11.5–15.5)
Smear Review: NORMAL
WBC Count: 24.2 10*3/uL — ABNORMAL HIGH (ref 4.0–10.5)
nRBC: 0.1 % (ref 0.0–0.2)

## 2023-01-21 LAB — SAMPLE TO BLOOD BANK

## 2023-01-21 MED ORDER — SODIUM CHLORIDE 0.9 % IV SOLN: Freq: Once | INTRAVENOUS | Status: AC

## 2023-01-21 NOTE — Progress Notes (Signed)
[facility]  [phone] [phone]   Name: Thomas [name] Date: 01/21/2023 MRN: [id number]  DOB: 09/14/1951  Patient Care Team: Patient, No Pcp Per as PCP - General ([place]) Park Liter, MD as PCP - Cardiology (Cardiology) Pickenpack-Cousar, Carlena Sax, Thomas as Nurse Practitioner (Nurse Practitioner)     INTERVAL HISTORY: Thomas [name] is a 72 y.o. male with oncologic medical history including right iliac lytic lesion, right upper lob nodule, right paratracheal nodule, and right hilar and mediastinal adenopathy. He is currently receiving Keytruda. Palliative asked to see for symptom management and goals of care.   SOCIAL HISTORY:     reports that he quit smoking about 28 years ago. His smoking use included cigarettes. He has a 6.00 pack-year smoking history. He has never used smokeless tobacco. He reports that he does not currently use alcohol. He reports that he does not use drugs.  ADVANCE DIRECTIVES: none   CODE STATUS: Full Code  PAST MEDICAL HISTORY: Past Medical History:  Diagnosis Date   Abdominal wall pain 04/02/2021   Acute ischemic stroke ([place]) 03/22/2020   Avulsed toenail, initial encounter 05/04/2018   Dyspnea on exertion 02/10/2020   ED (erectile dysfunction)    Epidermoid cyst of neck 10/16/2017   Essential hypertension 07/21/2017   GERD (gastroesophageal reflux disease)    Gout of multiple sites 07/21/2017   History of kidney stones    History of radiation therapy    Right Chest, Right Pelvis- 10/30/22-11/13/22- Sr. 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  MEDICATIONS:  Current Outpatient Medications  Medication Sig Dispense Refill   acetaminophen (TYLENOL) 650 MG CR tablet Take 1,300 mg by mouth every 8 (eight) hours.     allopurinol (ZYLOPRIM) 300 MG tablet Take 300 mg by mouth in the morning.     amLODipine (NORVASC) 5 MG tablet Take 1 tablet (5 mg total) by mouth daily. (Patient taking differently: Take 5 mg by mouth in the morning.) 190 tablet 3   CLARITIN 10 MG tablet Take 10 mg by mouth in the morning.     diphenoxylate-atropine (LOMOTIL) 2.5-0.025 MG tablet Take 1 tablet by mouth 4 (four) times daily as needed for diarrhea or loose stools. Take 2 tablets after the first stool, then take as directed. 60 tablet 0   FeFum-FePoly-FA-B Cmp-C-Biot (INTEGRA PLUS) CAPS Take 1 capsule by mouth every morning. 30 capsule 2   fentaNYL (DURAGESIC) 50 MCG/HR Place 1 patch onto the skin every 3 days. 10 patch 0   folic acid (FOLVITE) 1 MG tablet Take 1 tablet (1 mg total) by mouth daily. 30 tablet 4   gabapentin (NEURONTIN) 300 MG capsule Take 600 mg by mouth every evening.     [START ON 01/22/2023] HYDROmorphone (DILAUDID) 8 MG tablet Take 1 tablet (8 mg total) by mouth every 4 (four) hours as  needed for severe pain. 90 tablet 0   IMODIUM A-D 2 MG tablet Take 2 mg by mouth every 4 (four) hours as needed for diarrhea or loose stools.     lidocaine-prilocaine (EMLA) cream Apply to the Port-A-Cath site 30-60 minutes before treatment 30 g 0   minoxidil (LONITEN) 10 MG tablet Take 1-1.5 tablets (10-15 mg total) by mouth 2 (two) times daily. TAKE 1 TABLET IN THE MORNING AND TAKE 1/2 TABLET AT NIGHT (Patient taking differently: Take 10 mg by mouth See admin instructions. Take 10 mg by mouth in the morning and evening) 135 tablet 1    ondansetron (ZOFRAN-ODT) 8 MG disintegrating tablet Take 1 tablet (8 mg total) by mouth every 8 (eight) hours as needed for nausea or vomiting. 30 tablet 2   pantoprazole (PROTONIX) 40 MG tablet Take 1 tablet (40 mg total) by mouth 2 (two) times daily. (Patient taking differently: Take 40 mg by mouth 2 (two) times daily before a meal.) 60 tablet 2   polyethylene glycol (MIRALAX / GLYCOLAX) 17 g packet Take 17 g by mouth 2 (two) times daily. (Patient taking differently: Take 17 g by mouth 2 (two) times daily as needed for mild constipation.) 14 each 0   predniSONE (DELTASONE) 10 MG tablet Take 1 tablet (10 mg total) by mouth every morning. 30 tablet 0   PRESCRIPTION MEDICATION Apply 1 Application topically See admin instructions. Compounded pain cream ([place])- apply to the feet twice a day as needed for pain     PRESCRIPTION MEDICATION CPAP- At bedtime     prochlorperazine (COMPAZINE) 10 MG tablet Take 1 tablet (10 mg total) by mouth every 6 (six) hours as needed for nausea or vomiting. 30 tablet 0   senna (SENOKOT) 8.6 MG TABS tablet Take 2 tablets (17.2 mg total) by mouth 2 (two) times daily. (Patient not taking: Reported on 12/12/2022) 120 tablet 6   valsartan (DIOVAN) 320 MG tablet TAKE 1 TABLET BY MOUTH EVERY DAY (Patient taking differently: Take 320 mg by mouth in the morning.) 90 tablet 3   No current facility-administered medications for this visit.    VITAL SIGNS: There were no vitals taken for this visit. There were no vitals filed for this visit.  Estimated body mass index is 24.7 kg/m as calculated from the following:   Height as of 01/14/23: '5\' 11"'$  (1.803 m).   Weight as of 01/19/23: 177 lb 1.6 oz (80.3 kg).   PERFORMANCE STATUS (ECOG) : 1 - Symptomatic but completely ambulatory  Assessment NAD, lying in hospital bed in infusion area RRR Normal breathing pattern AAO x3, mood appropriate   IMPRESSION: Patient resting with eyes closed and is easily awakened.  Visit conducted in infusion area with daughter Thomas [name] present for a portion of the visit. Thomas [name] reports that he is most concerned about his constipation.  Constipation Thomas [name] chief concern at this visit is constipation. Pattern has been 1-1.5 days between each bowel movement but no more than 2 days. When patient goes more than 24 hours, he reports having discomfort. Reviewed patient's medications and confirmed that he is taking Senna rather than Senna-S. Reports that he just increased dosing to 2 tablets in the morning and 2 tablets at night. Encouraged patient to continue this regimen - but with Senna-S which is a bit stronger than plain Senna. Thomas [name] asks if he should enlist something even stronger and is advised against that for now, given recent history of diarrhea. Will wait to see how  this change impacts bowel pattern.  Reports new-onset hemorrhoids. Education provided regarding Preparation-H and its administration to ease discomfort and reduce swelling.   Neoplasm related pain  Thomas [name] consistently taking Gabapentin 300 mg in the morning and 900 mg at bedtime. He reports that tingling is much improved - especially during the daytime. Does still struggle with more discomfort from tingling at night. Low-dose steroid daily as per oncology. Patient continues to utilize Fentanyl patch and PRN Dilaudid. Will continue to monitor and adjust medications as needed to manage this symptom.   3.   Nausea Nausea is currently managed with the alternation of Zofran and Compazine.   4.    Goals of care/coping 01/14/23-Thomas [name] reports that his two visits with Dr. Binnie Kand were very helpful. Would like to continue these end-of-life conversations with palliative team with social work involvement as indicated. Thomas [name] is aware that visits can be conducted alone or with family if he is desiring for them to receive emotional support. Patient expresses concern for his wife.  States, "I want to outline a path from here to death..I don't want there to be questions about what to do... or decisions to be made under duress."    Patient confirms that he received contact information for [facility] social worker, Manuela Schwartz, and states he will schedule an appointment.  PLAN: Begin Senna-S 2 tabs BID in lieu of plain Senna. Will continue to monitor and adjust cautiously given recent history of diarrhea. Continue Fentanyl 50 mcg patch.   Continue Hydromorphone 8 mg as needed for breakthrough pain.  Continue daily low-dose steroid as per oncology.  Continue Gabapentin 300 mg in am and 900 mg at hs. Continue alternation of PRN Zofran '4mg'$  and Compazine '10mg'$  for management of nausea. Encourage contact with social work for emotional supports and end-of-life planning. We will continue to closely monitor and adjust medications as needed. Palliative follow-up within next 1-2 weeks, coordinated with oncology appointments/treatments.  Patient expressed understanding and was in agreement with this plan. He also understands that He can call the clinic at any time with any questions, concerns, or complaints.      Signed by: Moss Mc RN  Any controlled substances utilized were prescribed in the context of palliative care. PDMP has been reviewed.   Time Total: 30 min   I assessed patient with Thomas [name], Thomas [name]. Agree with above findings.   Visit consisted of counseling and education dealing with the complex and emotionally intense issues of symptom management and palliative care in the setting of serious and potentially life-threatening illness.Greater than 50%  of this time was spent counseling and coordinating care related to the above assessment and plan.  Alda Lea, AGPCNP-BC  Palliative Medicine Team/[place] [facility]

## 2023-01-21 NOTE — Patient Instructions (Signed)

## 2023-01-22 ENCOUNTER — Other Ambulatory Visit (HOSPITAL_COMMUNITY): Payer: Self-pay

## 2023-01-23 ENCOUNTER — Other Ambulatory Visit (HOSPITAL_COMMUNITY): Payer: Self-pay

## 2023-01-26 ENCOUNTER — Ambulatory Visit (INDEPENDENT_AMBULATORY_CARE_PROVIDER_SITE_OTHER): Payer: Medicare Other | Admitting: Psychologist

## 2023-01-26 DIAGNOSIS — F4321 Adjustment disorder with depressed mood: Secondary | ICD-10-CM

## 2023-01-26 NOTE — Progress Notes (Signed)
[facility] Counselor/Therapist Progress Note  Patient ID: Thomas [name], MRN: [id number],    Date: 01/26/2023  Time Spent: 08:10 am to 08:48 am; total time: 38 minutes   This session was held via video webex teletherapy due to the coronavirus risk at this time. The patient consented to video teletherapy and was located at his home during this session. He is aware it is the responsibility of the patient to secure confidentiality on his end of the session. The provider was in a private home office for the duration of this session. Limits of confidentiality were discussed with the patient.   Treatment Type: Individual Therapy  Reported Symptoms: Talking about roles in dying and bucket list  Mental Status Exam: Appearance:  Well Groomed     Behavior: Appropriate  Motor: Normal  Speech/Language:  Clear and Coherent  Affect: Appropriate  Mood: normal  Thought process: normal  Thought content:   WNL  Sensory/Perceptual disturbances:   WNL  Orientation: oriented to person, place, and time/date  Attention: Good  Concentration: Good  Memory: WNL  Fund of knowledge:  Good  Insight:   Good  Judgment:  Good  Impulse Control: Good   Risk Assessment: Danger to Self:  No Self-injurious Behavior: No Danger to Others: No Duty to Warn:no Physical Aggression / Violence:No  Access to Firearms a concern: No  Gang Involvement:No   Subjective: Beginning the session, patient described himself as doing well. Elaborating, patient stated that he would like to transition care to the social work at the oncology center with this clinician leaving. From there, he spent time talking about different roles in the family related to dying. He then spent time reflecting on possibly creating a bucket list. He ended the session by reflecting on the idea of sharing messages that he would like to pass along to future generations to come. He processed thoughts and emotions. He denied suicidal and  homicidal ideation.    Interventions:  Worked on developing a therapeutic relationship with the patient using active listening and reflective statements. Provided emotional support using empathy and validation. Reviewed the treatment plan with the patient. Praised the patient for doing well and explored what has assisted the patient in doing well. Reflected on the most recent round of cancer treatment. Identified goals for the session. Used socratic questions to assist the patient gain insight into self. Explored whether or not spouse would benefit from seeing her own counseling. Provided psychoeducation about completing a consent of release form for social work. Explored the idea of creating a bucket list. Provided psychoeducation about legacy videos. Processed thoughts and emotions. Provided empathic statements. Assessed for suicidal and homicidal ideation.   Homework: NA  Next Session: NA. 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 adjustment disorder with depressed mood  Plan:   Goals Work through the grieving process and face reality of own death Accept emotional support from others around them Live life to the fullest, event though time may be limited Become as knowledgeable about the medical condition  Reduce fear, anxiety about the health condition  Accept the illness Accept the role of psychological and behavioral factors  Stabilize anxiety level wile increasing ability to function Learn and implement coping skills that result in a reduction of anxiety  Alleviate depressive symptoms Recognize, accept, and cope with depressive feelings Develop healthy thinking patterns Develop healthy interpersonal relationships  Objectives target date for all objectives is 01/06/2024 Identify feelings associated with  the illness Family members share with each other feelings Identify the losses or limitations that have been  experienced Verbalize acceptance of the reality of the medical condition Commit to learning and implement a proactive approach to managing personal stresses Verbalize an understanding of the medical condition Work with therapist to develop a plan for coping with stress Learn and implement skills for managing stress Engage in social, productive activities that are possible Engage in faith based activities implement positive imagery Identify coping skills and sources of emotional support Patient's partner and family members verbalize their fears regarding severity of health condition Identify sources of emotional distress  Learning and implement calming skills to reduce overall anxiety Learn and implement problem solving strategies Identify and engage in pleasant activities Learning and implement personal and interpersonal skills to reduce anxiety and improve interpersonal relationships Learn to accept limitations in life and commit to tolerating, rather than avoiding, unpleasant emotions while accomplishing meaningful goals Identify major life conflicts from the past and present that form the basis for present anxiety Learn and implement behavioral strategies Verbalize an understanding and resolution of current interpersonal problems Learn and implement problem solving and decision making skills Learn and implement conflict resolution skills to resolve interpersonal problems Verbalize an understanding of healthy and unhealthy emotions verbalize insight into how past relationships may be influence current experiences with depression Use mindfulness and acceptance strategies and increase value based behavior  Increase hopeful statements about the future.   Interventions Teach about stress and ways to handle stress Assist the patient in developing a coping action plan for stressors Conduct skills based training for coping strategies Train problem focused skills Sort out what activities the  individual can do Encourage patient to rely upon his/her spiritual faith Teach the patient to use guided imagery Probe and evaluate family's ability to provide emotional support Allow family to share their fears Assist the patient in identifying, sorting through, and verbalizing the various feelings generated by his/her medical condition Meet with family members  Ask patient list out limitations  Use stress inoculation training  Use Acceptance and Commitment Therapy to help client accept uncomfortable realities in order to accomplish value-consistent goals Reinforce the client's insight into the role of his/her past emotional pain and present anxiety  Discuss examples demonstrating that unrealistic worry overestimates the probability of threats and underestimate patient's ability  Assist the patient in analyzing his or her worries Help patient understand that avoidance is reinforcing  Behavioral activation help the client explore the relationship, nature of the dispute,  Help the client develop new interpersonal skills and relationships Conduct Problem so living therapy Teach conflict resolution skills Use a process-experiential approach Conduct TLDP Conduct ACT  The patient and clinician reviewed the treatment plan on 01/13/2023. The patient approved of the treatment plan.   Conception Chancy, PsyD

## 2023-01-27 ENCOUNTER — Other Ambulatory Visit: Payer: Self-pay | Admitting: Nurse Practitioner

## 2023-01-27 DIAGNOSIS — Z515 Encounter for palliative care: Secondary | ICD-10-CM

## 2023-01-27 DIAGNOSIS — M792 Neuralgia and neuritis, unspecified: Secondary | ICD-10-CM

## 2023-01-27 DIAGNOSIS — C3491 Malignant neoplasm of unspecified part of right bronchus or lung: Secondary | ICD-10-CM

## 2023-01-27 MED ORDER — GABAPENTIN 300 MG PO CAPS: ORAL_CAPSULE | ORAL | 2 refills | Status: DC

## 2023-01-27 NOTE — Telephone Encounter (Signed)
Late entry for 01/21/2023. This nurse received [employer] paperwork signed by provider.  Awaiting signed [facility] ROI.   ROI.  ROI not received in forms pick-up bin.  JAISHAWN BUFFONE has left for the day.  Awaiting next scheduled appointment 01/28/2023 for ROI and patient completion of policy owner information for return. 01/26/2022: Today, collaborative nurse inquired about "form, possible return to patient who has to meet a two-week deadline." This nurse returned form with provider section 3 completed and signed, then returned to patient e-mail [email or web address] for patient to complete process.  Copy to registration ABC-file for patient Pick-up with ROI to sign.  Copy to H.I.M. bin designated for items to be scanned awaiting ROI completes process.  No further activity required or performed by this nurse.

## 2023-01-28 ENCOUNTER — Other Ambulatory Visit: Payer: Medicare Other

## 2023-01-28 ENCOUNTER — Encounter: Payer: Self-pay | Admitting: Internal Medicine

## 2023-01-28 ENCOUNTER — Inpatient Hospital Stay: Payer: Medicare Other

## 2023-01-28 DIAGNOSIS — Z5111 Encounter for antineoplastic chemotherapy: Secondary | ICD-10-CM

## 2023-01-28 DIAGNOSIS — C3491 Malignant neoplasm of unspecified part of right bronchus or lung: Secondary | ICD-10-CM

## 2023-01-28 DIAGNOSIS — R110 Nausea: Secondary | ICD-10-CM

## 2023-01-28 DIAGNOSIS — C3411 Malignant neoplasm of upper lobe, right bronchus or lung: Secondary | ICD-10-CM | POA: Diagnosis not present

## 2023-01-28 LAB — CMP (CANCER CENTER ONLY)
ALT: 13 U/L (ref 0–44)
AST: 17 U/L (ref 15–41)
Albumin: 3.5 g/dL (ref 3.5–5.0)
Alkaline Phosphatase: 117 U/L (ref 38–126)
Anion gap: 8 (ref 5–15)
BUN: 14 mg/dL (ref 8–23)
CO2: 29 mmol/L (ref 22–32)
Calcium: 8.7 mg/dL — ABNORMAL LOW (ref 8.9–10.3)
Chloride: 103 mmol/L (ref 98–111)
Creatinine: 0.82 mg/dL (ref 0.61–1.24)
GFR, Estimated: >60 mL/min (ref >60)
Glucose, Bld: 132 mg/dL — ABNORMAL HIGH (ref 70–99)
Potassium: 4.7 mmol/L (ref 3.5–5.1)
Sodium: 140 mmol/L (ref 135–145)
Total Bilirubin: 0.3 mg/dL (ref 0.3–1.2)
Total Protein: 6.3 g/dL — ABNORMAL LOW (ref 6.5–8.1)

## 2023-01-28 LAB — CBC WITH DIFFERENTIAL (CANCER CENTER ONLY)
Abs Immature Granulocytes: 0.16 10*3/uL — ABNORMAL HIGH (ref 0.00–0.07)
Basophils Absolute: 0 10*3/uL (ref 0.0–0.1)
Basophils Relative: 0 %
Eosinophils Absolute: 0 10*3/uL (ref 0.0–0.5)
Eosinophils Relative: 0 %
HCT: 28.9 % — ABNORMAL LOW (ref 39.0–52.0)
Hemoglobin: 9 g/dL — ABNORMAL LOW (ref 13.0–17.0)
Immature Granulocytes: 1 %
Lymphocytes Relative: 3 %
Lymphs Abs: 0.5 10*3/uL — ABNORMAL LOW (ref 0.7–4.0)
MCH: 28.4 pg (ref 26.0–34.0)
MCHC: 31.1 g/dL (ref 30.0–36.0)
MCV: 91.2 fL (ref 80.0–100.0)
Monocytes Absolute: 0.5 10*3/uL (ref 0.1–1.0)
Monocytes Relative: 3 %
Neutro Abs: 15 10*3/uL — ABNORMAL HIGH (ref 1.7–7.7)
Neutrophils Relative %: 93 %
Platelet Count: 188 10*3/uL (ref 150–400)
RBC: 3.17 MIL/uL — ABNORMAL LOW (ref 4.22–5.81)
RDW: 21 % — ABNORMAL HIGH (ref 11.5–15.5)
WBC Count: 16.2 10*3/uL — ABNORMAL HIGH (ref 4.0–10.5)
nRBC: 0 % (ref 0.0–0.2)

## 2023-01-28 LAB — SAMPLE TO BLOOD BANK

## 2023-01-28 NOTE — Telephone Encounter (Addendum)
Staff message received from collaborative nurse of facsimile from [place] requesting Physician Attending Statement successfully faxed 01/27/2023.  E-mail transmission completed to [email or web address] per [employer] [id number].    Noted new fax cover sheet includes Subject: [id number] which was not provided with paperwork e-mailed to [email or web address] by Wayne Both.  Re-faxed request adding above claim or policy number to facilitate [employer] process.  Again, ROI again remains in ABC-folder of registration area.

## 2023-01-30 NOTE — Progress Notes (Deleted)
[place]  [phone] [phone]   Name: Thomas [name] Date: 01/30/2023 MRN: [id number]  DOB: [birth year]  Patient Care Team: Patient, No Pcp Per as PCP - General ([place]) Park Liter, MD as PCP - Cardiology (Cardiology) Pickenpack-Cousar, Carlena Sax, NP as Nurse Practitioner (Nurse Practitioner)     INTERVAL HISTORY: Thomas [name] is a 72 y.o. male with oncologic medical history including right iliac lytic lesion, right upper lob nodule, right paratracheal nodule, and right hilar and mediastinal adenopathy. He is currently receiving Keytruda. Palliative asked to see for symptom management and goals of care.   SOCIAL HISTORY:     reports that he quit smoking about 28 years ago. His smoking use included cigarettes. He has a 6.00 pack-year smoking history. He has never used smokeless tobacco. He reports that he does not currently use alcohol. He reports that he does not use drugs.  ADVANCE DIRECTIVES: none   CODE STATUS: Full Code  PAST MEDICAL HISTORY: Past Medical History:  Diagnosis Date   Abdominal wall pain 04/02/2021   Acute ischemic stroke ([place]) 03/22/2020   Avulsed toenail, initial encounter 05/04/2018   Dyspnea on exertion 02/10/2020   ED (erectile dysfunction)    Epidermoid cyst of neck 10/16/2017   Essential hypertension 07/21/2017   GERD (gastroesophageal reflux disease)    Gout of multiple sites 07/21/2017   History of kidney stones    History of radiation therapy    Right Chest, Right Pelvis- 10/30/22-11/13/22- Sr. 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  MEDICATIONS:  Current Outpatient Medications  Medication Sig Dispense Refill   acetaminophen (TYLENOL) 650 MG CR tablet Take 1,300 mg by mouth every 8 (eight) hours.     allopurinol (ZYLOPRIM) 300 MG tablet Take 300 mg by mouth in the morning.     amLODipine (NORVASC) 5 MG tablet Take 1 tablet (5 mg total) by mouth daily. (Patient taking differently: Take 5 mg by mouth in the morning.) 190 tablet 3   CLARITIN 10 MG tablet Take 10 mg by mouth in the morning.     diphenoxylate-atropine (LOMOTIL) 2.5-0.025 MG tablet Take 1 tablet by mouth 4 (four) times daily as needed for diarrhea or loose stools. Take 2 tablets after the first stool, then take as directed. 60 tablet 0   FeFum-FePoly-FA-B Cmp-C-Biot (INTEGRA PLUS) CAPS Take 1 capsule by mouth every morning. 30 capsule 2   fentaNYL (DURAGESIC) 50 MCG/HR Place 1 patch onto the skin every 3 days. 10 patch 0   folic acid (FOLVITE) 1 MG tablet Take 1 tablet (1 mg total) by mouth daily. 30 tablet 4   gabapentin (NEURONTIN) 300 MG capsule Take 2 capsules (600mg ) daily and 3 capsules (900mg )  at bedtime 90 capsule 2   HYDROmorphone (DILAUDID) 8 MG tablet Take 1 tablet (8 mg total) by mouth every 4 (  four) hours as needed for severe pain. 90 tablet 0   IMODIUM A-D 2 MG tablet Take 2 mg by mouth every 4 (four) hours as needed for diarrhea or loose stools.     lidocaine-prilocaine (EMLA) cream Apply to the Port-A-Cath site 30-60 minutes before treatment 30 g 0   minoxidil (LONITEN) 10 MG tablet Take 1-1.5 tablets (10-15 mg total) by mouth 2 (two) times daily. TAKE 1 TABLET IN THE MORNING AND TAKE 1/2 TABLET AT NIGHT (Patient taking differently: Take 10 mg by mouth See admin instructions. Take 10 mg by mouth in the  morning and evening) 135 tablet 1   ondansetron (ZOFRAN-ODT) 8 MG disintegrating tablet Take 1 tablet (8 mg total) by mouth every 8 (eight) hours as needed for nausea or vomiting. 30 tablet 2   pantoprazole (PROTONIX) 40 MG tablet Take 1 tablet (40 mg total) by mouth 2 (two) times daily. (Patient taking differently: Take 40 mg by mouth 2 (two) times daily before a meal.) 60 tablet 2   polyethylene glycol (MIRALAX / GLYCOLAX) 17 g packet Take 17 g by mouth 2 (two) times daily. (Patient taking differently: Take 17 g by mouth 2 (two) times daily as needed for mild constipation.) 14 each 0   predniSONE (DELTASONE) 10 MG tablet Take 1 tablet (10 mg total) by mouth every morning. 30 tablet 0   PRESCRIPTION MEDICATION Apply 1 Application topically See admin instructions. Compounded pain cream ([place])- apply to the feet twice a day as needed for pain     PRESCRIPTION MEDICATION CPAP- At bedtime     prochlorperazine (COMPAZINE) 10 MG tablet Take 1 tablet (10 mg total) by mouth every 6 (six) hours as needed for nausea or vomiting. 30 tablet 0   senna (SENOKOT) 8.6 MG TABS tablet Take 2 tablets (17.2 mg total) by mouth 2 (two) times daily. (Patient not taking: Reported on 12/12/2022) 120 tablet 6   valsartan (DIOVAN) 320 MG tablet TAKE 1 TABLET BY MOUTH EVERY DAY (Patient taking differently: Take 320 mg by mouth in the morning.) 90 tablet 3   No current facility-administered medications for this visit.    VITAL SIGNS: There were no vitals taken for this visit. There were no vitals filed for this visit.  Estimated body mass index is 24.7 kg/m as calculated from the following:   Height as of 01/14/23: 5\' 11"  (1.803 m).   Weight as of 01/19/23: 177 lb 1.6 oz (80.3 kg).   PERFORMANCE STATUS (ECOG) : 1 - Symptomatic but completely ambulatory  Assessment NAD, lying in hospital bed in infusion area RRR Normal breathing pattern AAO x3, mood appropriate    IMPRESSION:   Constipation    Neoplasm related pain  [name] consistently taking Gabapentin 300 mg in the morning and 900 mg at bedtime. He reports that tingling is much improved - especially during the daytime. Does still struggle with more discomfort from tingling at night. Low-dose steroid daily as per oncology. Patient continues to utilize Fentanyl patch and PRN Dilaudid. Will continue to monitor and adjust medications as needed to manage this symptom.   3.   Nausea Nausea is currently managed with the alternation of Zofran and Compazine.   4.    Goals of care  01/14/23-Mr. Salib reports that his two visits with Dr. Binnie Kand were very helpful. Would like to continue these end-of-life conversations with palliative team with social work involvement as indicated. Mr. Hartong is aware that visits can be conducted alone or with family  if he is desiring for them to receive emotional support. Patient expresses concern for his wife. States, "I want to outline a path from here to death..I don't want there to be questions about what to do... or decisions to be made under duress."    Patient confirms that he received contact information for [facility] social worker, Manuela Schwartz, and states he will schedule an appointment.  PLAN: Begin Senna-S 2 tabs BID in lieu of plain Senna. Will continue to monitor and adjust cautiously given recent history of diarrhea. Continue Fentanyl 50 mcg patch.   Continue Hydromorphone 8 mg as needed for breakthrough pain.  Continue daily low-dose steroid as per oncology.  Continue Gabapentin 300 mg in am and 900 mg at hs. Continue alternation of PRN Zofran 4mg  and Compazine 10mg  for management of nausea. Encourage contact with social work for emotional supports and end-of-life planning. We will continue to closely monitor and adjust medications as needed. Palliative follow-up within next 1-2 weeks, coordinated with oncology  appointments/treatments.  Patient expressed understanding and was in agreement with this plan. He also understands that He can call the clinic at any time with any questions, concerns, or complaints.   Any controlled substances utilized were prescribed in the context of palliative care. PDMP has been reviewed.   Time Total: 30 min   Alda Lea, AGPCNP-BC  Palliative Medicine Team/[place]

## 2023-02-02 ENCOUNTER — Other Ambulatory Visit: Payer: Self-pay

## 2023-02-02 ENCOUNTER — Ambulatory Visit (HOSPITAL_COMMUNITY)
Admission: RE | Admit: 2023-02-02 | Discharge: 2023-02-02 | Disposition: A | Discharge status: Home / Self Care | Payer: Medicare Other | Source: Ambulatory Visit | Attending: Physician Assistant | Admitting: Physician Assistant

## 2023-02-02 ENCOUNTER — Other Ambulatory Visit: Payer: Self-pay | Admitting: Physician Assistant

## 2023-02-02 DIAGNOSIS — Z515 Encounter for palliative care: Secondary | ICD-10-CM

## 2023-02-02 DIAGNOSIS — G893 Neoplasm related pain (acute) (chronic): Secondary | ICD-10-CM

## 2023-02-02 DIAGNOSIS — C3490 Malignant neoplasm of unspecified part of unspecified bronchus or lung: Secondary | ICD-10-CM

## 2023-02-02 DIAGNOSIS — J90 Pleural effusion, not elsewhere classified: Secondary | ICD-10-CM | POA: Diagnosis not present

## 2023-02-02 DIAGNOSIS — M898X5 Other specified disorders of bone, thigh: Secondary | ICD-10-CM

## 2023-02-02 DIAGNOSIS — R110 Nausea: Secondary | ICD-10-CM

## 2023-02-02 DIAGNOSIS — C3491 Malignant neoplasm of unspecified part of right bronchus or lung: Secondary | ICD-10-CM | POA: Insufficient documentation

## 2023-02-02 DIAGNOSIS — K7689 Other specified diseases of liver: Secondary | ICD-10-CM | POA: Diagnosis not present

## 2023-02-02 DIAGNOSIS — N281 Cyst of kidney, acquired: Secondary | ICD-10-CM | POA: Diagnosis not present

## 2023-02-02 DIAGNOSIS — J9811 Atelectasis: Secondary | ICD-10-CM | POA: Diagnosis not present

## 2023-02-02 MED ORDER — SODIUM CHLORIDE (PF) 0.9 % IJ SOLN
INTRAMUSCULAR | Status: AC
  Filled 2023-02-02: qty 50

## 2023-02-02 MED ORDER — IOHEXOL 300 MG/ML  SOLN
100.0000 mL | Freq: Once | INTRAMUSCULAR | Status: AC | PRN
  Administered 2023-02-02: 100 mL via INTRAVENOUS

## 2023-02-02 MED ORDER — ONDANSETRON 8 MG PO TBDP: 8.0000 mg | ORAL_TABLET | Freq: Three times a day (TID) | ORAL | 2 refills | Status: DC | PRN

## 2023-02-04 ENCOUNTER — Inpatient Hospital Stay: Payer: Medicare Other | Admitting: Licensed Clinical Social Worker

## 2023-02-04 DIAGNOSIS — C3491 Malignant neoplasm of unspecified part of right bronchus or lung: Secondary | ICD-10-CM

## 2023-02-04 MED FILL — Fosaprepitant Dimeglumine For IV Infusion 150 MG (Base Eq): INTRAVENOUS | Qty: 5 | Status: AC

## 2023-02-04 MED FILL — Dexamethasone Sodium Phosphate Inj 100 MG/10ML: INTRAMUSCULAR | Qty: 1 | Status: AC

## 2023-02-04 NOTE — Progress Notes (Signed)
[place] CSW Counseling Note  Patient was referred by medical provider. Treatment type: Individual  Presenting Concerns: Patient and/or family reports the following symptoms/concerns: anxiety Duration of problem: less than 6 months months; Severity of problem: moderate   Orientation:oriented to person, place, time/date, situation, day of week, month of year, and year.   Affect: Appropriate Risk of harm to self or others: No plan to harm self or others  Patient and/or Family's Strengths/Protective Factors: Social connections, Social and Emotional competence, Concrete supports in place (healthy food, safe environments, etc.), and Sense of purposeAbility for insight  Average or above average intelligence  Capable of independent living  Engineer, drilling fund of knowledge  Supportive family/friends      Goals Addressed: Patient will:  Reduce symptoms of: anxiety Increase knowledge and/or ability of: coping skills  Increase healthy adjustment to current life circumstances   Progress towards Goals: Initial   Interventions: Interventions utilized:  Solution Focused, Strength-based, and Supportive      Assessment: Patient currently experiencing anxiety regarding the uncertainty of the rate at which his disease will progress and planning future treatment to ensure he continues to have quality of life while at the same time plans a "death with dignity".  CSW explored appropriate questions to ask at his next appointment as he recently had scans and has not tolerated his current regiment well.  Family discussions and considerations for future planning explored.  Supportive counseling provided.  Pt's son will graduate from college at the beginning of May and will also compete in a [employer] competition.  Pt's priority is to be physically present for those important events.  CSW explored appropriate questions to ask his providers to ensure pt is able to honor  the things that are most important to him for the remainder of his life.      Plan: Follow up with CSW: Pt will contact CSW after his f/u w/ Dr. Julien Nordmann. Behavioral recommendations:  Referral(s):        Henriette Combs, LCSW

## 2023-02-05 ENCOUNTER — Emergency Department (HOSPITAL_COMMUNITY)
Admission: EM | Admit: 2023-02-05 | Discharge: 2023-02-05 | Disposition: A | Discharge status: Home / Self Care | Payer: Medicare Other | Source: Home / Self Care | Attending: Emergency Medicine | Admitting: Emergency Medicine

## 2023-02-05 ENCOUNTER — Inpatient Hospital Stay (HOSPITAL_BASED_OUTPATIENT_CLINIC_OR_DEPARTMENT_OTHER): Payer: Medicare Other | Admitting: Internal Medicine

## 2023-02-05 ENCOUNTER — Inpatient Hospital Stay: Payer: Medicare Other

## 2023-02-05 ENCOUNTER — Other Ambulatory Visit (HOSPITAL_COMMUNITY): Payer: Self-pay

## 2023-02-05 ENCOUNTER — Encounter: Payer: Self-pay | Admitting: Internal Medicine

## 2023-02-05 ENCOUNTER — Other Ambulatory Visit: Payer: Medicare Other

## 2023-02-05 ENCOUNTER — Other Ambulatory Visit: Payer: Self-pay

## 2023-02-05 ENCOUNTER — Emergency Department (HOSPITAL_COMMUNITY): Payer: Medicare Other

## 2023-02-05 ENCOUNTER — Telehealth: Payer: Self-pay | Admitting: Pharmacist

## 2023-02-05 ENCOUNTER — Telehealth: Payer: Self-pay | Admitting: Pharmacy Technician

## 2023-02-05 ENCOUNTER — Encounter: Payer: Self-pay | Admitting: Nutrition

## 2023-02-05 ENCOUNTER — Inpatient Hospital Stay: Payer: Medicare Other | Admitting: Nutrition

## 2023-02-05 ENCOUNTER — Inpatient Hospital Stay: Payer: Medicare Other | Admitting: Nurse Practitioner

## 2023-02-05 DIAGNOSIS — R591 Generalized enlarged lymph nodes: Secondary | ICD-10-CM | POA: Diagnosis not present

## 2023-02-05 DIAGNOSIS — J90 Pleural effusion, not elsewhere classified: Secondary | ICD-10-CM | POA: Diagnosis not present

## 2023-02-05 DIAGNOSIS — Z66 Do not resuscitate: Secondary | ICD-10-CM | POA: Diagnosis not present

## 2023-02-05 DIAGNOSIS — Z6824 Body mass index (BMI) 24.0-24.9, adult: Secondary | ICD-10-CM | POA: Diagnosis not present

## 2023-02-05 DIAGNOSIS — E440 Moderate protein-calorie malnutrition: Secondary | ICD-10-CM | POA: Diagnosis present

## 2023-02-05 DIAGNOSIS — R10819 Abdominal tenderness, unspecified site: Secondary | ICD-10-CM | POA: Insufficient documentation

## 2023-02-05 DIAGNOSIS — C3491 Malignant neoplasm of unspecified part of right bronchus or lung: Secondary | ICD-10-CM

## 2023-02-05 DIAGNOSIS — Z7982 Long term (current) use of aspirin: Secondary | ICD-10-CM | POA: Diagnosis not present

## 2023-02-05 DIAGNOSIS — R072 Precordial pain: Secondary | ICD-10-CM

## 2023-02-05 DIAGNOSIS — K219 Gastro-esophageal reflux disease without esophagitis: Secondary | ICD-10-CM | POA: Diagnosis present

## 2023-02-05 DIAGNOSIS — C799 Secondary malignant neoplasm of unspecified site: Secondary | ICD-10-CM | POA: Diagnosis not present

## 2023-02-05 DIAGNOSIS — R918 Other nonspecific abnormal finding of lung field: Secondary | ICD-10-CM | POA: Diagnosis not present

## 2023-02-05 DIAGNOSIS — I10 Essential (primary) hypertension: Secondary | ICD-10-CM | POA: Insufficient documentation

## 2023-02-05 DIAGNOSIS — Z79899 Other long term (current) drug therapy: Secondary | ICD-10-CM | POA: Diagnosis not present

## 2023-02-05 DIAGNOSIS — R9431 Abnormal electrocardiogram [ECG] [EKG]: Secondary | ICD-10-CM | POA: Diagnosis not present

## 2023-02-05 DIAGNOSIS — C3490 Malignant neoplasm of unspecified part of unspecified bronchus or lung: Secondary | ICD-10-CM | POA: Insufficient documentation

## 2023-02-05 DIAGNOSIS — Z7189 Other specified counseling: Secondary | ICD-10-CM | POA: Diagnosis not present

## 2023-02-05 DIAGNOSIS — G629 Polyneuropathy, unspecified: Secondary | ICD-10-CM | POA: Diagnosis present

## 2023-02-05 DIAGNOSIS — I2510 Atherosclerotic heart disease of native coronary artery without angina pectoris: Secondary | ICD-10-CM | POA: Diagnosis present

## 2023-02-05 DIAGNOSIS — Z923 Personal history of irradiation: Secondary | ICD-10-CM | POA: Diagnosis not present

## 2023-02-05 DIAGNOSIS — I25119 Atherosclerotic heart disease of native coronary artery with unspecified angina pectoris: Secondary | ICD-10-CM | POA: Diagnosis not present

## 2023-02-05 DIAGNOSIS — Z96641 Presence of right artificial hip joint: Secondary | ICD-10-CM | POA: Insufficient documentation

## 2023-02-05 DIAGNOSIS — C7951 Secondary malignant neoplasm of bone: Secondary | ICD-10-CM | POA: Diagnosis present

## 2023-02-05 DIAGNOSIS — J9811 Atelectasis: Secondary | ICD-10-CM | POA: Diagnosis not present

## 2023-02-05 DIAGNOSIS — J189 Pneumonia, unspecified organism: Secondary | ICD-10-CM | POA: Diagnosis not present

## 2023-02-05 DIAGNOSIS — R7989 Other specified abnormal findings of blood chemistry: Secondary | ICD-10-CM | POA: Diagnosis not present

## 2023-02-05 DIAGNOSIS — Z7902 Long term (current) use of antithrombotics/antiplatelets: Secondary | ICD-10-CM | POA: Diagnosis not present

## 2023-02-05 DIAGNOSIS — I2119 ST elevation (STEMI) myocardial infarction involving other coronary artery of inferior wall: Secondary | ICD-10-CM | POA: Diagnosis not present

## 2023-02-05 DIAGNOSIS — I2109 ST elevation (STEMI) myocardial infarction involving other coronary artery of anterior wall: Secondary | ICD-10-CM | POA: Diagnosis not present

## 2023-02-05 DIAGNOSIS — G893 Neoplasm related pain (acute) (chronic): Secondary | ICD-10-CM | POA: Diagnosis present

## 2023-02-05 DIAGNOSIS — Z711 Person with feared health complaint in whom no diagnosis is made: Secondary | ICD-10-CM | POA: Diagnosis not present

## 2023-02-05 DIAGNOSIS — I213 ST elevation (STEMI) myocardial infarction of unspecified site: Secondary | ICD-10-CM | POA: Insufficient documentation

## 2023-02-05 DIAGNOSIS — E785 Hyperlipidemia, unspecified: Secondary | ICD-10-CM | POA: Diagnosis present

## 2023-02-05 DIAGNOSIS — Z515 Encounter for palliative care: Secondary | ICD-10-CM | POA: Diagnosis not present

## 2023-02-05 DIAGNOSIS — I6930 Unspecified sequelae of cerebral infarction: Secondary | ICD-10-CM | POA: Diagnosis not present

## 2023-02-05 DIAGNOSIS — I219 Acute myocardial infarction, unspecified: Secondary | ICD-10-CM | POA: Diagnosis not present

## 2023-02-05 DIAGNOSIS — R0781 Pleurodynia: Secondary | ICD-10-CM | POA: Diagnosis not present

## 2023-02-05 DIAGNOSIS — Z789 Other specified health status: Secondary | ICD-10-CM | POA: Diagnosis not present

## 2023-02-05 DIAGNOSIS — I309 Acute pericarditis, unspecified: Secondary | ICD-10-CM | POA: Diagnosis present

## 2023-02-05 DIAGNOSIS — R52 Pain, unspecified: Secondary | ICD-10-CM | POA: Diagnosis not present

## 2023-02-05 DIAGNOSIS — R079 Chest pain, unspecified: Secondary | ICD-10-CM | POA: Diagnosis not present

## 2023-02-05 DIAGNOSIS — Z87891 Personal history of nicotine dependence: Secondary | ICD-10-CM | POA: Insufficient documentation

## 2023-02-05 DIAGNOSIS — J181 Lobar pneumonia, unspecified organism: Secondary | ICD-10-CM | POA: Diagnosis not present

## 2023-02-05 DIAGNOSIS — J910 Malignant pleural effusion: Secondary | ICD-10-CM | POA: Diagnosis present

## 2023-02-05 LAB — CBC WITH DIFFERENTIAL/PLATELET
Abs Immature Granulocytes: 0.13 10*3/uL — ABNORMAL HIGH (ref 0.00–0.07)
Basophils Absolute: 0 10*3/uL (ref 0.0–0.1)
Basophils Relative: 0 %
Eosinophils Absolute: 0 10*3/uL (ref 0.0–0.5)
Eosinophils Relative: 0 %
HCT: 31.7 % — ABNORMAL LOW (ref 39.0–52.0)
Hemoglobin: 9.9 g/dL — ABNORMAL LOW (ref 13.0–17.0)
Immature Granulocytes: 1 %
Lymphocytes Relative: 2 %
Lymphs Abs: 0.4 10*3/uL — ABNORMAL LOW (ref 0.7–4.0)
MCH: 28.6 pg (ref 26.0–34.0)
MCHC: 31.2 g/dL (ref 30.0–36.0)
MCV: 91.6 fL (ref 80.0–100.0)
Monocytes Absolute: 1.1 10*3/uL — ABNORMAL HIGH (ref 0.1–1.0)
Monocytes Relative: 6 %
Neutro Abs: 18.7 10*3/uL — ABNORMAL HIGH (ref 1.7–7.7)
Neutrophils Relative %: 91 %
Platelets: 337 10*3/uL (ref 150–400)
RBC: 3.46 MIL/uL — ABNORMAL LOW (ref 4.22–5.81)
RDW: 20.3 % — ABNORMAL HIGH (ref 11.5–15.5)
WBC: 20.4 10*3/uL — ABNORMAL HIGH (ref 4.0–10.5)
nRBC: 0 % (ref 0.0–0.2)

## 2023-02-05 LAB — COMPREHENSIVE METABOLIC PANEL
ALT: 55 U/L — ABNORMAL HIGH (ref 0–44)
AST: 246 U/L — ABNORMAL HIGH (ref 15–41)
Albumin: 3.5 g/dL (ref 3.5–5.0)
Alkaline Phosphatase: 91 U/L (ref 38–126)
Anion gap: 11 (ref 5–15)
BUN: 15 mg/dL (ref 8–23)
CO2: 27 mmol/L (ref 22–32)
Calcium: 9.1 mg/dL (ref 8.9–10.3)
Chloride: 93 mmol/L — ABNORMAL LOW (ref 98–111)
Creatinine, Ser: 0.84 mg/dL (ref 0.61–1.24)
GFR, Estimated: >60 mL/min (ref >60)
Glucose, Bld: 127 mg/dL — ABNORMAL HIGH (ref 70–99)
Potassium: 4.3 mmol/L (ref 3.5–5.1)
Sodium: 131 mmol/L — ABNORMAL LOW (ref 135–145)
Total Bilirubin: 0.6 mg/dL (ref 0.3–1.2)
Total Protein: 7 g/dL (ref 6.5–8.1)

## 2023-02-05 LAB — SAMPLE TO BLOOD BANK

## 2023-02-05 LAB — CMP (CANCER CENTER ONLY)
ALT: 47 U/L — ABNORMAL HIGH (ref 0–44)
AST: 221 U/L (ref 15–41)
Albumin: 3.6 g/dL (ref 3.5–5.0)
Alkaline Phosphatase: 89 U/L (ref 38–126)
Anion gap: 11 (ref 5–15)
BUN: 16 mg/dL (ref 8–23)
CO2: 28 mmol/L (ref 22–32)
Calcium: 9 mg/dL (ref 8.9–10.3)
Chloride: 94 mmol/L — ABNORMAL LOW (ref 98–111)
Creatinine: 0.79 mg/dL (ref 0.61–1.24)
GFR, Estimated: >60 mL/min (ref >60)
Glucose, Bld: 100 mg/dL — ABNORMAL HIGH (ref 70–99)
Potassium: 3.4 mmol/L — ABNORMAL LOW (ref 3.5–5.1)
Sodium: 133 mmol/L — ABNORMAL LOW (ref 135–145)
Total Bilirubin: 0.4 mg/dL (ref 0.3–1.2)
Total Protein: 6.5 g/dL (ref 6.5–8.1)

## 2023-02-05 LAB — CBC WITH DIFFERENTIAL (CANCER CENTER ONLY)
Abs Immature Granulocytes: 0.08 10*3/uL — ABNORMAL HIGH (ref 0.00–0.07)
Basophils Absolute: 0 10*3/uL (ref 0.0–0.1)
Basophils Relative: 0 %
Eosinophils Absolute: 0 10*3/uL (ref 0.0–0.5)
Eosinophils Relative: 0 %
HCT: 28.4 % — ABNORMAL LOW (ref 39.0–52.0)
Hemoglobin: 8.9 g/dL — ABNORMAL LOW (ref 13.0–17.0)
Immature Granulocytes: 1 %
Lymphocytes Relative: 4 %
Lymphs Abs: 0.7 10*3/uL (ref 0.7–4.0)
MCH: 28.5 pg (ref 26.0–34.0)
MCHC: 31.3 g/dL (ref 30.0–36.0)
MCV: 91 fL (ref 80.0–100.0)
Monocytes Absolute: 1.3 10*3/uL — ABNORMAL HIGH (ref 0.1–1.0)
Monocytes Relative: 8 %
Neutro Abs: 13.8 10*3/uL — ABNORMAL HIGH (ref 1.7–7.7)
Neutrophils Relative %: 87 %
Platelet Count: 321 10*3/uL (ref 150–400)
RBC: 3.12 MIL/uL — ABNORMAL LOW (ref 4.22–5.81)
RDW: 20 % — ABNORMAL HIGH (ref 11.5–15.5)
WBC Count: 15.9 10*3/uL — ABNORMAL HIGH (ref 4.0–10.5)
nRBC: 0 % (ref 0.0–0.2)

## 2023-02-05 LAB — ECHOCARDIOGRAM LIMITED
Height: 71 in
S' Lateral: 3 cm
Weight: 2786.61 oz

## 2023-02-05 LAB — MAGNESIUM: Magnesium: 1.8 mg/dL (ref 1.7–2.4)

## 2023-02-05 LAB — TROPONIN I (HIGH SENSITIVITY): Troponin I (High Sensitivity): >24000 ng/L (ref <18)

## 2023-02-05 LAB — CBG MONITORING, ED: Glucose-Capillary: 103 mg/dL — ABNORMAL HIGH (ref 70–99)

## 2023-02-05 MED ORDER — HYDROMORPHONE HCL 2 MG PO TABS
12.0000 mg | ORAL_TABLET | Freq: Once | ORAL | Status: AC
  Administered 2023-02-05: 12 mg via ORAL
  Filled 2023-02-05: qty 6

## 2023-02-05 MED ORDER — ASPIRIN 81 MG PO CHEW: 81.0000 mg | CHEWABLE_TABLET | Freq: Every day | ORAL | 0 refills | Status: DC

## 2023-02-05 MED ORDER — ACETAMINOPHEN 500 MG PO TABS
1000.0000 mg | ORAL_TABLET | Freq: Once | ORAL | Status: AC
  Administered 2023-02-05: 1000 mg via ORAL
  Filled 2023-02-05: qty 2

## 2023-02-05 MED ORDER — KRAZATI 200 MG PO TABS
600.0000 mg | ORAL_TABLET | Freq: Two times a day (BID) | ORAL | 2 refills | Status: DC
  Filled 2023-02-05: qty 180, 30d supply, fill #0

## 2023-02-05 MED ORDER — ASPIRIN 81 MG PO CHEW
324.0000 mg | CHEWABLE_TABLET | Freq: Once | ORAL | Status: DC
  Filled 2023-02-05: qty 4

## 2023-02-05 MED ORDER — CLOPIDOGREL BISULFATE 75 MG PO TABS: 75.0000 mg | ORAL_TABLET | Freq: Every day | ORAL | 0 refills | Status: DC

## 2023-02-05 MED ORDER — METOPROLOL SUCCINATE ER 25 MG PO TB24: 25.0000 mg | ORAL_TABLET | Freq: Two times a day (BID) | ORAL | 0 refills | Status: DC

## 2023-02-05 MED ORDER — PREDNISONE 20 MG PO TABS
10.0000 mg | ORAL_TABLET | Freq: Every morning | ORAL | 0 refills | Status: DC
  Filled 2023-02-05: qty 30, 60d supply, fill #0

## 2023-02-05 NOTE — Progress Notes (Signed)
Pt  transported to ED via wheelchair for abnormal EKG.

## 2023-02-05 NOTE — Discharge Instructions (Addendum)
As discussed, we have adjusted your medication regimen, and it is important that you monitor your condition carefully, while taking it easy for the next few days.  Do not hesitate to return here.  Otherwise follow-up with your physician.

## 2023-02-05 NOTE — Consult Note (Signed)
CARDIOLOGY CONSULT NOTE  Patient ID: Thomas [name] MRN: [id number] DOB/AGE: 04/28/1951 72 y.o.  Admit date: 02/05/2023 Referring Physician: Zacarias Pontes  ER Reason for Consultation:  Chest pain, abnormal EKG  HPI:   72 y.o. Caucasian male  with stage IV (T1b, N3, M1 C) non-small cell lung cancer, adenocarcinoma, with multiple mets, now with acute myocardial infarction.  Today is Thomas [name] 72th birthday. Patient was at Dr. Lew Dawes office. He has had disease progression in spite of systemic chemotherapy with  carboplatin for AUC of 5, Alimta 500 Mg/M2 and Keytruda 200 Mg IV every 3 weeks status post 3 cycles. Today, they discussed about option of palliative care and hospice versus consideration of treatment with targeted therapy for the positive KRAS G12C mutation with Krazati (Adagrasib) 600 mg p.o. twice daily. Patient and family chose the latter. As normal Qtc is a pre -requisite for starting on this therapy, and EKG was obtained that showed inferior ST elevation. He was thus sent to the ER.  On my interview, patient is resting in hospital bed. It is very difficult to differentiate from various pain symptoms he has had ever since having metastatic cancer. He is on fentanyl patches and oral dilaudid for pain control, which also may be masking his MI pain symptoms. However, after specific questions, it does appear that patient had a relatively new left sided chest pain, radiating to his left arm starting 02/04/2023 AM and lasted for several hours, improved with pain medications. Patient attributed this pain to his metastases. At present, he does not have any chest pain. He does have other musculoskeletal pains eg in his buttocks. He denies any change from his baseline mild dyspnea. He also denies orthopnea, PND, leg edema symptoms.   Please see below re: EKG, echocardiogram findings.     for discussion regarding   Past Medical History:  Diagnosis Date   Abdominal wall pain 04/02/2021    Acute ischemic stroke ([place]) 03/22/2020   Avulsed toenail, initial encounter 05/04/2018   Dyspnea on exertion 02/10/2020   ED (erectile dysfunction)    Epidermoid cyst of neck 10/16/2017   Essential hypertension 07/21/2017   GERD (gastroesophageal reflux disease)    Gout of multiple sites 07/21/2017   History of kidney stones    History of radiation therapy    Right Chest, Right Pelvis- 10/30/22-11/13/22- Sr. 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   Urgency of urination    URI (upper respiratory infection) 11/08/2014   Wears glasses      Past Surgical History:  Procedure Laterality Date   COLONOSCOPY WITH PROPOFOL  2017   CYSTOSCOPY/RETROGRADE/URETEROSCOPY/STONE EXTRACTION WITH BASKET Left 04/16/2018   Procedure: CYSTOSCOPY/RETROGRADE/URETEROSCOPY/STONE EXTRACTION WITH BASKET/ HOLMIUM LASER LITHOTRIPSY/ STENT PLACEMENT;  Surgeon: Ardis Hughs, MD;  [facility];  Service: Urology;  Laterality: Left;   HERNIA REPAIR Right 2022   inguinal   HOLMIUM LASER APPLICATION Left 11/25/3233   Procedure: HOLMIUM LASER APPLICATION;  Surgeon: Ardis Hughs, MD;  [facility];  Service: Urology;  Laterality:  Left;   TOTAL HIP ARTHROPLASTY Right 06/30/2022   Procedure: RIGHT TOTAL HIP ARTHROPLASTY ANTERIOR APPROACH;  Surgeon: Leandrew Koyanagi, MD;  Location: [facility];  Service: Orthopedics;  Laterality: Right;  3-C      Family History  Problem Relation Age of Onset   Heart disease Mother 29   Alcohol abuse  Mother    Depression Mother    Hypertension Father    Sleep apnea Father    Cancer Sister        hodgkins, breast   Colon cancer Neg Hx    Pancreatic cancer Neg Hx    Rectal cancer Neg Hx    Stomach cancer Neg Hx    Esophageal cancer Neg Hx    Inflammatory bowel disease Neg Hx    Liver disease Neg Hx      Social History: Social History   Socioeconomic History   Marital status: Married    Spouse name: [name]   Number of children: 2   Years of education: Not on file   Highest education level: Not on file  Occupational History   Occupation: Pharmacist, hospital  Tobacco Use   Smoking status: Former    Packs/day: 0.30    Years: 20.00    Additional pack years: 0.00    Total pack years: 6.00    Types: Cigarettes    Quit date: 10/17/1994    Years since quitting: 28.3   Smokeless tobacco: Never  Vaping Use   Vaping Use: Not on file  Substance and Sexual Activity   Alcohol use: Not Currently   Drug use: No   Sexual activity: Yes  Other Topics Concern   Not on file  Social History Narrative   Exercise 1 hour a day ---3-4 days a week   Social Determinants of Health   Financial Resource Strain: Not on file  Food Insecurity: No Food Insecurity (12/12/2022)   Hunger Vital Sign    Worried About Running Out of Food in the Last Year: Never true    Dawn in the Last Year: Never true  Transportation Needs: No Transportation Needs (12/12/2022)   PRAPARE - Hydrologist (Medical): No    Lack of Transportation (Non-Medical): No  Physical Activity: Not on file  Stress: Not on file  Social Connections: Not on file  Intimate Partner Violence: Not At Risk (12/12/2022)   Humiliation, Afraid, Rape, and Kick questionnaire    Fear of Current or Ex-Partner: No    Emotionally Abused: No    Physically Abused: No    Sexually Abused: No     (Not in a hospital admission)   Review of Systems  Constitutional: Positive for malaise/fatigue.  Cardiovascular:   Negative for dyspnea on exertion, leg swelling, palpitations and syncope.  Musculoskeletal:  Positive for back pain.      Physical Exam: Physical Exam Vitals and nursing note reviewed.  Constitutional:      General: He is not in acute distress. Neck:     Vascular: No JVD.  Cardiovascular:     Rate and Rhythm: Regular rhythm. Tachycardia present.     Heart sounds: Normal heart sounds. No murmur heard. Pulmonary:     Effort: Pulmonary effort is normal.     Breath sounds: Normal breath sounds. No wheezing or rales.  Musculoskeletal:     Right lower leg: No edema.     Left lower leg: No edema.        Imaging/tests reviewed and independently interpreted:  Lab Results: CBC, BMP, trop HS  Cardiac Studies:  Telemetry 02/05/2023: Sinus tachycardia, occasional PVC, no other arrhythmia  EKG 02/05/2023: Serial EKGs showing sinus rhythm/tachcyardia with inferior STEMI, no deep Q waves,   Echocardiogram 02/05/2023:  1. Left ventricular ejection fraction, by estimation, is 55 to 60%. The  left ventricle has normal function. The left ventricle has no regional  wall motion abnormalities. Left ventricular diastolic function could not  be evaluated.   2. Right ventricular systolic function is normal. The right ventricular  size is normal.   3. The mitral valve is grossly normal. Trivial mitral valve  regurgitation.    Assessment & Recommendations:   72 y.o. Caucasian male  with stage IV (T1b, N3, M1 C) non-small cell lung cancer, adenocarcinoma, with multiple mets, now with acute myocardial infarction.  STEMI: Likely late presentation with onset of symptoms roughly 24 hours ago. At present, patient is chest pain free, although pain could potentially be masked by pain medications. In spite of duration of onset of symptoms, and trop HS of >24000, remarkably his echocardiogram showed normal wall motion, LVEF, RVEF. My suspicion is that he likely occluded a relatively small RCA, that  is being compensated by collaterals, at least at rest. Per my discussion with the patient as well as Dr. Earlie Server, his prognosis is 3 months without successful chemotherapy with Alfred Levins (Adagrasib), and no more than 12-15 months even with successful chemotherapy with Alfred Levins (Adagrasib).  Given overall late presentation, current absence of chest pain and surprisingly preserved function at leas at rest, and IV lung cancer, I do not think benefits of PCI outweigh the risks. I would also be concerned with risk of bleeding with his metastatic cancer, and then risk of acute stent thrombosis if he were to interrupt DAPT in case of any cancer related bleeding post PCI. Patient understands the risk of complication from MI, including but not limited to recurrent MI, heart failure, arrhythmia, mechanical complications. Fortunately, at least at this time, patient does not have any evidence of heart failure.   At this point, patient is quite clear that he would not like to proceed with any invasive treatment for his MI. I do support his decision. Patient also unequivocally expressed wishes not to be resuscitated in case of a cardiac arrest. I have also discussed this with is wife, ER physician Dr. Vanita Panda, and patient's oncologist Dt. Mohammed.   I recommend no physical exertion at least for next one week. We could treat his MI medically with DAPT with Aspirin, plavix, metoprolol tartrate 25 mg bid, and continue patients baseline PCSK9 inhibitor therapy given his statin intolerance. I have discussed with Dr. Earlie Server re: likely need for increasing his current pain regimen to assist with MI related pain control.  Having an MI would not preclude him from chemotherapy with Alfred Levins Dulcy Fanny), as per my discussion with Dr. Earlie Server. Patient and family (wife [name] and daughter [name]) were pleased to know this.  I will arrange outpatient follow up.   Finally, in absence of invasive therapy and overall goals of care  being conservative with MI treatment, we all mutually agreed on letting the patient go home with above medical therapy and enjoy his birthday-maybe with a piece of cake and glass of wine.   All questions answered to the best of my ability.  Time spent: 120 min  Discussed interpretation of tests and management recommendations with the primary team     Nigel Mormon, MD Pager: [phone] Office: [phone]

## 2023-02-05 NOTE — Progress Notes (Signed)
DISCONTINUE ON PATHWAY REGIMEN - Non-Small Cell Lung     A cycle is every 21 days:     Pembrolizumab      Pemetrexed      Carboplatin   **Always confirm dose/schedule in your pharmacy ordering system**  REASON: Disease Progression PRIOR TREATMENT: [id number]: Pembrolizumab 200 mg + Pemetrexed 500 mg/m2 + Carboplatin AUC=5 q21 Days x 4 Cycles TREATMENT RESPONSE: Progressive Disease (PD)  START OFF PATHWAY REGIMEN - Non-Small Cell Lung   [id number]:Adagrasib 600 mg PO BID D1-28 q28 Days:   A cycle is every 28 days:     Adagrasib   **Always confirm dose/schedule in your pharmacy ordering system**  Patient Characteristics: Stage IV Metastatic, Nonsquamous, Molecular Analysis Completed, Molecular Alteration Present and Eligible for Molecular Targeted Therapy, Second Line - Molecular Targeted Therapy, KRAS G12C Mutation Positive Therapeutic Status: Stage IV Metastatic Histology: Nonsquamous Cell Broad Molecular Profiling Status: Molecular Analysis Completed Molecular Analysis Results: Alteration Present and Eligible for Molecular Targeted Therapy Molecular Alteration Present: KRAS G12C Mutation Positive Molecular Targeted Line of Therapy: [facility] visitor Targeted Therapy Intent of Therapy: Non-Curative / Palliative Intent, Discussed with Patient

## 2023-02-05 NOTE — Telephone Encounter (Signed)
Oral Oncology Patient Advocate Encounter  After completing a benefits investigation, prior authorization for Alfred Levins is not required at this time through [facility].  Patient's copay is $2,567.01.     Lady Deutscher, CPhT-Adv Oncology Pharmacy Patient [place] Direct Number: [phone]  Fax: [phone]

## 2023-02-05 NOTE — ED Provider Notes (Signed)
[facility] Provider Note   CSN: [id number] Arrival date & time: 02/05/23  1055     History  Chief Complaint  Patient presents with   Abnormal ECG    Thomas [name] is a 72 y.o. male.  HPI Patient presents with cancer center with concern for abnormal EKG and chest pain that occurred yesterday.  Patient has metastatic non-small cell lung cancer.  CT scan earlier this week showed progression of disease and he is in the process of stopping chemotherapy, starting oral regimen.  He notes he has ongoing abdominal pain that is essentially unchanged, but yesterday he had pain across the superior anterior thorax, brief.  He may have had 1 earlier episode today, currently no chest pain, no dyspnea. Today he went for follow-up from his CT scan earlier this week, was sent here due to abnormal EKG.    Home Medications Prior to Admission medications   Medication Sig Start Date End Date Taking? Authorizing Provider  acetaminophen (TYLENOL) 650 MG CR tablet Take 1,300 mg by mouth every 8 (eight) hours.    [provider]  adagrasib (KRAZATI) 200 MG tablet Take 3 tablets (600 mg total) by mouth 2 (two) times daily. 02/05/23   Curt Bears, MD  allopurinol (ZYLOPRIM) 300 MG tablet Take 300 mg by mouth in the morning. 05/14/20   [provider]  amLODipine (NORVASC) 5 MG tablet Take 1 tablet (5 mg total) by mouth daily. Patient taking differently: Take 5 mg by mouth in the morning. 06/06/22   Richardson Dopp T, PA-C  CLARITIN 10 MG tablet Take 10 mg by mouth in the morning.    [provider]  diphenoxylate-atropine (LOMOTIL) 2.5-0.025 MG tablet Take 1 tablet by mouth 4 (four) times daily as needed for diarrhea or loose stools. Take 2 tablets after the first stool, then take as directed. 01/14/23   Heilingoetter, Cassandra L, PA-C  FeFum-FePoly-FA-B Cmp-C-Biot (INTEGRA PLUS) CAPS Take 1 capsule by mouth every morning. 01/14/23    Heilingoetter, Cassandra L, PA-C  fentaNYL (DURAGESIC) 50 MCG/HR Place 1 patch onto the skin every 3 days. 01/20/23   Pickenpack-Cousar, Carlena Sax, NP  folic acid (FOLVITE) 1 MG tablet Take 1 tablet (1 mg total) by mouth daily. 11/26/22   Curt Bears, MD  gabapentin (NEURONTIN) 300 MG capsule Take 2 capsules (600mg ) daily and 3 capsules (900mg )  at bedtime 01/27/23   Pickenpack-Cousar, Carlena Sax, NP  HYDROmorphone (DILAUDID) 8 MG tablet Take 1 tablet (8 mg total) by mouth every 4 (four) hours as needed for severe pain. 01/22/23   Pickenpack-Cousar, Carlena Sax, NP  IMODIUM A-D 2 MG tablet Take 2 mg by mouth every 4 (four) hours as needed for diarrhea or loose stools.    [provider]  lidocaine-prilocaine (EMLA) cream Apply to the Port-A-Cath site 30-60 minutes before treatment 12/25/22   Curt Bears, MD  minoxidil (LONITEN) 10 MG tablet Take 1-1.5 tablets (10-15 mg total) by mouth 2 (two) times daily. TAKE 1 TABLET IN THE MORNING AND TAKE 1/2 TABLET AT NIGHT Patient taking differently: Take 10 mg by mouth See admin instructions. Take 10 mg by mouth in the morning and evening 09/12/22   Park Liter, MD  ondansetron (ZOFRAN-ODT) 8 MG disintegrating tablet Take 1 tablet (8 mg total) by mouth every 8 (eight) hours as needed for nausea or vomiting. 02/02/23   Pickenpack-Cousar, Carlena Sax, NP  pantoprazole (PROTONIX) 40 MG tablet Take 1 tablet (40 mg total) by  mouth 2 (two) times daily. Patient taking differently: Take 40 mg by mouth 2 (two) times daily before a meal. 11/26/22   Pickenpack-Cousar, Carlena Sax, NP  polyethylene glycol (MIRALAX / GLYCOLAX) 17 g packet Take 17 g by mouth 2 (two) times daily. Patient taking differently: Take 17 g by mouth 2 (two) times daily as needed for mild constipation. 11/02/22   Antonieta Pert, MD  predniSONE (DELTASONE) 20 MG tablet Take 0.5 tablets (10 mg total) by mouth every morning. 02/05/23   Curt Bears, MD  PRESCRIPTION MEDICATION Apply 1 Application  topically See admin instructions. Compounded pain cream ([place])- apply to the feet twice a day as needed for pain    [provider]  PRESCRIPTION MEDICATION CPAP- At bedtime    [provider]  prochlorperazine (COMPAZINE) 10 MG tablet Take 1 tablet (10 mg total) by mouth every 6 (six) hours as needed for nausea or vomiting. 01/01/23   Pickenpack-Cousar, Carlena Sax, NP  senna (SENOKOT) 8.6 MG TABS tablet Take 2 tablets (17.2 mg total) by mouth 2 (two) times daily. Patient not taking: Reported on 12/12/2022 11/27/22   Pickenpack-Cousar, Carlena Sax, NP  valsartan (DIOVAN) 320 MG tablet TAKE 1 TABLET BY MOUTH EVERY DAY Patient taking differently: Take 320 mg by mouth in the morning. 08/20/22   Park Liter, MD      Allergies    Statins    Review of Systems   Review of Systems  All other systems reviewed and are negative.   Physical Exam Updated Vital Signs BP 134/70 (BP Location: Left Arm)   Pulse 87   Temp (!) 97 F (36.1 C) (Oral)   Resp (!) 21   Ht 5\' 11"  (1.803 m)   Wt 79 kg   SpO2 98%   BMI 24.29 kg/m  Physical Exam Vitals and nursing note reviewed.  Constitutional:      General: He is not in acute distress.    Appearance: He is well-developed.  HENT:     Head: Normocephalic and atraumatic.  Eyes:     Conjunctiva/sclera: Conjunctivae normal.  Cardiovascular:     Rate and Rhythm: Normal rate and regular rhythm.  Pulmonary:     Effort: Pulmonary effort is normal. No respiratory distress.     Breath sounds: No stridor.  Abdominal:     General: There is no distension.     Tenderness: There is abdominal tenderness. There is guarding.  Skin:    General: Skin is warm and dry.  Neurological:     Mental Status: He is alert and oriented to person, place, and time.     ED Results / Procedures / Treatments   Labs (all labs ordered are listed, but only abnormal results are displayed) Labs Reviewed  COMPREHENSIVE METABOLIC PANEL   MAGNESIUM  CBC WITH DIFFERENTIAL/PLATELET  CBG MONITORING, ED  TROPONIN I (HIGH SENSITIVITY)    EKG None  Radiology No results found.  Procedures Procedures    Medications Ordered in ED Medications  aspirin chewable tablet 324 mg (has no administration in time range)    ED Course/ Medical Decision Making/ A&P                             Medical Decision Making Patient with multiple medical issues most prominently metastatic non-small cell lung cancer, not responding to chemotherapy presents after episode of chest pain that occurred yesterday with abnormal EKG today.  Patient placed on continuous  cardiac monitor and pulse oximetry. Cardiac 85 sinus normal Pulse ox 100% room air normal I reviewed the patient's ECG from our cancer center and it is abnormal compared to prior.  Patient is currently asymptomatic in terms of thoracic complaints, will have labs x-ray aspirin. Differential includes ACS, pneumonia, pneumothorax, progression of disease  Amount and/or Complexity of Data Reviewed Independent Historian: spouse External Data Reviewed: notes.    Details: Cancer center notes from today reviewed and CT scan from this week as well as CT PE study from earlier this year included below. Labs: ordered. Decision-making details documented in ED Course. Radiology: ordered and independent interpretation performed. Decision-making details documented in ED Course. ECG/medicine tests: ordered and independent interpretation performed. Decision-making details documented in ED Course.  Risk OTC drugs. Prescription drug management. Decision regarding hospitalization.   (Oncology)DIAGNOSIS: Stage IV (T1b, N3, M1 C) non-small cell lung cancer, adenocarcinoma presented with right upper lobe lung nodule in addition to mediastinal and right hilar adenopathy as well as right-sided pleural metastatic disease with malignant right pleural effusion and multiple osseous metastatic lesions and  single hypermetabolic retroperitoneal lymph node diagnosed in December 2023  CTA PE 12/12/22 IMPRESSION: 1. Suboptimal evaluation of the pulmonary arteries. See above discussion. Within this limitation, there is no evidence of a large or central pulmonary embolism. There is tumor encasement of the right upper lobe pulmonary artery which is severely narrowed. 2. No significant interval progression of known metastatic lung cancer including right upper lobe lung lesion, mediastinal and right hilar lymphadenopathy, retroperitoneal lymph node involvement, as well as osseous metastatic involvement of the right ninth rib and right iliac bone. 3. Colonic diverticulosis with subtle pericolonic stranding of the proximal sigmoid colon, suggestive of mild acute uncomplicated diverticulitis. 4. Aortic atherosclerosis (ICD10-I70.0).     Electronically Signed   By: Davina Poke D.O.   On: 12/12/2022 14:13   CT 02/03/23 IMPRESSION: Overall progression of disease.   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 In addition there is one area which appears to extend posteriorly through the chest wall and involve the right ninth rib.   Increased lymph nodes identified in the mediastinum, retrocrural, upper abdomen and left side thoracic inlet.   Increasing left-sided adrenal metastases.   Increasing lytic bone metastases.   Once again there is a dilated gallbladder. If there is concern of gallbladder pathology additional workup with ultrasound as clinically appropriate.   Solitary loop of small bowel which is mildly dilated in the left midabdomen without a focal transition. Nonspecific.   Findings will be called to the ordering service by the Radiology physician assistant team     Electronically Signed   By: Jill Side M.D.   On: 02/03/2023 10:40  11:37 AM I discussed the patient's EKG, at bedside, with Dr. Virgina Jock.  Patient confirms no chest pain  since about midnight last night, 11 hours ago.  Some suspicion patient patient have completed MRI, Dr. Melissa Montane will see the patient, echocardiogram, ordered, patient has no current chest pain.  2:16 PM I discussed the patient's presentation at bedside with the cardiologist, and the patient's wife.  Subsequently a conference call with the patient's oncologist and cardiologist, to discuss appropriate medications.  In essence, this patient has a strong preference to go home and given his progressive malignancy, this will be accommodated.  This adult male with progressive metastatic non-small cell lung cancer presents after chest pain yesterday, with abnormal EKG today, and findings on EKG, labs consistent with acute MI likely  to completion yesterday.  Patient had stat echocardiogram in the ED, conversations as above with cardiology and oncology, pain management with Dilaudid.  Given the patient's context we discussed options at length, and the patient will have initiation of antiplatelet agents, increased pain medication regimen, beta-blocker regimen, but will follow-up as an outpatient rather than be admitted for invasive testing.        Final Clinical Impression(s) / ED Diagnoses Final diagnoses:  Acute ST elevation myocardial infarction (STEMI), unspecified artery ([place])    CRITICAL CARE Performed by: Carmin Muskrat Total critical care time: 45 minutes Critical care time was exclusive of separately billable procedures and treating other patients. Critical care was necessary to treat or prevent imminent or life-threatening deterioration. Critical care was time spent personally by me on the following activities: development of treatment plan with patient and/or surrogate as well as nursing, discussions with consultants, evaluation of patient's response to treatment, examination of patient, obtaining history from patient or surrogate, ordering and performing treatments and interventions, ordering  and review of laboratory studies, ordering and review of radiographic studies, pulse oximetry and re-evaluation of patient's condition.    Carmin Muskrat, MD 02/05/23 [phone]

## 2023-02-05 NOTE — Progress Notes (Signed)
  Echocardiogram 2D Echocardiogram has been performed.  Thomas [name] 02/05/2023, 12:15 PM

## 2023-02-05 NOTE — Progress Notes (Signed)
Unable to complete nutrition consult secondary to patient transferred to emergency department.

## 2023-02-05 NOTE — ED Triage Notes (Signed)
Patient transported from Cancer center for abnormal EKG. States he took dilaudid at 0900 for chest pain.  119/62 100% RA 97.4

## 2023-02-05 NOTE — Telephone Encounter (Signed)
Oral Chemotherapy Pharmacist Encounter   I met with patient, patient's wife, and patient's daughter for overview of new oral chemotherapy medication: Alfred Levins (adagrasib) for the treatment of metastatic non-small cell lung cancer, KRAS G12C-mutated, planned duration until disease progression or unacceptable drug toxicity.  Counseled patient on administration, dosing, side effects, monitoring, drug-food interactions, safe handling, storage, and disposal.   CBC w/ Diff and CMP from 02/05/23 assessed - noted patient with AST of 221 U/L - per package insert, no baseline hepatic dose adjustments are required for [place]. Patient will have baseline EKG obtained 02/05/23 in clinic.   Patient will take Krazati 200 mg tablets, 3 tablets (600 mg total) by mouth 2 (two) times daily. Recommended patient take Alfred Levins with food to decrease GI upset.  Patient knows to avoid grapefruit and grapefruit juice while on Krazati.   Start date: pending manufacturer approval process   Side effects include but are not limited to: diarrhea, nausea/vomiting, hepatotoxicity, edema, fatigue, decrease in blood counts. Also reviewed rare but serious side effect of Qtc prolongation has been reported with medication. Nausea: Patient has anti-emetic on hand and knows to take it if nausea develops. We discussed use of prochlorperazine instead of ondansetron, since ondansetron can increase risk of Qtc prolongation. Patient expresses understanding.  We also discussed use of prochlorperazine 30-60 minutes prior to Krazati dosing to help prevent N/V.  Diarrhea: Patient has Imodium on hand at home and will alert the office of 4 or more loose stools above baseline.    Reviewed with patient importance of keeping a medication schedule and plan for any missed doses.  Medication list reviewed for patient, and the following DDIs were identified with Krazati:  Category D drug-drug interaction between [place] and Fentanyl - Krazati may increase  serum concentrations of Fentanyl leading to increased risk of oversedation, and possible respiratory distress. Confirmed that patient has Naloxone on hand at home for use if required. Per patient he has been stable on Fentanyl for a few months. Category D drug-drug interaction between [place] and Ondansetron due to risk of Qtc prolongation. Patient knows to avoid ondansetron if able while on Krazati and use prochlorperazine PRN for N/V instead. Category C drug-drug interaction between [place] and Amlodipine - Krazati may increase serum concentrations of amlodipine leading to increased risk of side effects including decreased BP, dizziness, etc. Patient BP in clinic on 02/05/23 is 119/62 mmHg. Patient states he has BP cuff at home to check BP and he will also reach out to PCP regarding amlodipine.   After discussion with patient no patient barriers to medication adherence identified.    All questions answered.  Mr. and Ms. Gobble voiced understanding and appreciation.   Medication education handout given to patient. Patient knows to call the office with questions or concerns. Oral Chemotherapy Clinic phone number provided to patient.  Leron Croak, PharmD, BCPS, [facility] Clinical Pharmacist [facility] [place] [phone] 02/05/2023 10:52 AM

## 2023-02-05 NOTE — Progress Notes (Signed)
[place] [phone]   [phone]  OFFICE PROGRESS NOTE  Michael Boston, MD [place] [address]  DIAGNOSIS: Stage IV (T1b, N3, M1 C) non-small cell lung cancer, adenocarcinoma presented with right upper lobe lung nodule in addition to mediastinal and right hilar adenopathy as well as right-sided pleural metastatic disease with malignant right pleural effusion and multiple osseous metastatic lesions and single hypermetabolic retroperitoneal lymph node diagnosed in December 2023  Biomarker Findings Microsatellite status - MS-Stable Tumor Mutational Burden - 2 Muts/Mb Genomic Findings For a complete list of the genes assayed, please refer to the Appendix. KRAS [id number] KEAP1 splice site [id number] MTAP loss CDKN2A/B CDKN2A loss, CDKN2B loss MDM4 S367L - subclonal? 7 Disease relevant genes with no reportable alterations: ALK, BRAF, EGFR, ERBB2, MET, RET, ROS1  PDL1 Expression: 0%  PRIOR THERAPY:  1) Status post palliative radiotherapy to the metastatic disease in the right hip area under the care of Dr. Sondra Come. 2) Systemic chemotherapy with carboplatin for AUC of 5, Alimta 500 Mg/M2 and Keytruda 200 Mg IV every 3 weeks.  First dose December 03, 2022.  Stat post 3 cycles.  This was discontinued secondary to disease progression.  CURRENT THERAPY: Krazati (Adagrasib) 600 mg p.o. twice daily.  Expected to start in the next few days.  INTERVAL HISTORY: Thomas [name] 72 y.o. male returns to the clinic today for follow-up visit accompanied by his wife [name] and his daughter [name].  The patient has been complaining of increasing fatigue and weakness as well as pain in the neck area as well as shoulders bilaterally.  He also has more pain on the right side of the chest and the right hip area.  He is currently on several pain medications including fentanyl patch 50 mcg/hour every 3 days as well as Dilaudid 8 mg p.o. every 4 hours in addition to  gabapentin.  His pain is not well-controlled.  He is also on prednisone 10 mg p.o. daily.  He tolerated the last cycle of his systemic chemotherapy fairly well except for the fatigue.  He has no current cough or hemoptysis but continues to have shortness of breath.  He has no nausea, vomiting, diarrhea but has constipation and he is trying Senokot as well as MiraLAX with no good results.  He has no fever or chills.  He lost few pounds since his last visit.  He was here today for evaluation and discussion of his recent scan results and treatment options.   MEDICAL HISTORY: Past Medical History:  Diagnosis Date   Abdominal wall pain 04/02/2021   Acute ischemic stroke ([place]) 03/22/2020   Avulsed toenail, initial encounter 05/04/2018   Dyspnea on exertion 02/10/2020   ED (erectile dysfunction)    Epidermoid cyst of neck 10/16/2017   Essential hypertension 07/21/2017   GERD (gastroesophageal reflux disease)    Gout of multiple sites 07/21/2017   History of kidney stones    History of radiation therapy    Right Chest, Right Pelvis- 10/30/22-11/13/22- Sr. 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  MEDICATIONS:  Current Outpatient Medications  Medication Sig Dispense Refill   acetaminophen (TYLENOL) 650 MG CR tablet Take 1,300 mg by mouth every 8 (eight) hours.     allopurinol (ZYLOPRIM) 300 MG tablet Take 300 mg by mouth in the morning.      amLODipine (NORVASC) 5 MG tablet Take 1 tablet (5 mg total) by mouth daily. (Patient taking differently: Take 5 mg by mouth in the morning.) 190 tablet 3   CLARITIN 10 MG tablet Take 10 mg by mouth in the morning.     diphenoxylate-atropine (LOMOTIL) 2.5-0.025 MG tablet Take 1 tablet by mouth 4 (four) times daily as needed for diarrhea or loose stools. Take 2 tablets after the first stool, then take as directed. 60 tablet 0   FeFum-FePoly-FA-B Cmp-C-Biot (INTEGRA PLUS) CAPS Take 1 capsule by mouth every morning. 30 capsule 2   fentaNYL (DURAGESIC) 50 MCG/HR Place 1 patch onto the skin every 3 days. 10 patch 0   folic acid (FOLVITE) 1 MG tablet Take 1 tablet (1 mg total) by mouth daily. 30 tablet 4   gabapentin (NEURONTIN) 300 MG capsule Take 2 capsules (600mg ) daily and 3 capsules (900mg )  at bedtime 90 capsule 2   HYDROmorphone (DILAUDID) 8 MG tablet Take 1 tablet (8 mg total) by mouth every 4 (four) hours as needed for severe pain. 90 tablet 0   IMODIUM A-D 2 MG tablet Take 2 mg by mouth every 4 (four) hours as needed for diarrhea or loose stools.     lidocaine-prilocaine (EMLA) cream Apply to the Port-A-Cath site 30-60 minutes before treatment 30 g 0   minoxidil (LONITEN) 10 MG tablet Take 1-1.5 tablets (10-15 mg total) by mouth 2 (two) times daily. TAKE 1 TABLET IN THE MORNING AND TAKE 1/2 TABLET AT NIGHT (Patient taking differently: Take 10 mg by mouth See admin instructions. Take 10 mg by mouth in the morning and evening) 135 tablet 1   ondansetron (ZOFRAN-ODT) 8 MG disintegrating tablet Take 1 tablet (8 mg total) by mouth every 8 (eight) hours as needed for nausea or vomiting. 30 tablet 2   pantoprazole (PROTONIX) 40 MG tablet Take 1 tablet (40 mg total) by mouth 2 (two) times daily. (Patient taking differently: Take 40 mg by mouth 2 (two) times daily before a meal.) 60 tablet 2   polyethylene glycol (MIRALAX / GLYCOLAX) 17 g packet Take 17 g by mouth 2 (two) times daily. (Patient taking  differently: Take 17 g by mouth 2 (two) times daily as needed for mild constipation.) 14 each 0   predniSONE (DELTASONE) 10 MG tablet Take 1 tablet (10 mg total) by mouth every morning. 30 tablet 0   PRESCRIPTION MEDICATION Apply 1 Application topically See admin instructions. Compounded pain cream ([place])- apply to the feet twice a day as needed for pain     PRESCRIPTION MEDICATION CPAP- At bedtime     prochlorperazine (COMPAZINE) 10 MG tablet Take 1 tablet (10 mg total) by mouth every 6 (six) hours as needed for nausea or vomiting. 30 tablet 0   senna (SENOKOT) 8.6 MG TABS tablet Take 2 tablets (17.2 mg total) by mouth 2 (two) times daily. (Patient not taking: Reported on 12/12/2022) 120 tablet 6   valsartan (DIOVAN) 320 MG tablet TAKE 1 TABLET BY MOUTH EVERY  DAY (Patient taking differently: Take 320 mg by mouth in the morning.) 90 tablet 3   No current facility-administered medications for this visit.    SURGICAL HISTORY:  Past Surgical History:  Procedure Laterality Date   COLONOSCOPY WITH PROPOFOL  2017   CYSTOSCOPY/RETROGRADE/URETEROSCOPY/STONE EXTRACTION WITH BASKET Left 04/16/2018   Procedure: CYSTOSCOPY/RETROGRADE/URETEROSCOPY/STONE EXTRACTION WITH BASKET/ HOLMIUM LASER LITHOTRIPSY/ STENT PLACEMENT;  Surgeon: Ardis Hughs, MD;  Location[facility];  Service: Urology;  Laterality: Left;   HERNIA REPAIR Right 2022   inguinal   HOLMIUM LASER APPLICATION Left [id number]   Procedure: HOLMIUM LASER APPLICATION;  Surgeon: Ardis Hughs, MD;  Location: [facility];  Service: Urology;  Laterality: Left;   TOTAL HIP ARTHROPLASTY Right 06/30/2022   Procedure: RIGHT TOTAL HIP ARTHROPLASTY ANTERIOR APPROACH;  Surgeon: Leandrew Koyanagi, MD;  Location: [place];  Service: Orthopedics;  Laterality: Right;  3-C    REVIEW OF SYSTEMS:  Constitutional: positive for anorexia, fatigue, and weight loss Eyes: negative Ears, nose, mouth, throat, and  face: negative Respiratory: positive for dyspnea on exertion and pleurisy/chest pain Cardiovascular: negative Gastrointestinal: positive for constipation Genitourinary:negative Integument/breast: negative Hematologic/lymphatic: negative Musculoskeletal:positive for bone pain Neurological: negative Behavioral/Psych: positive for anxiety and depression Endocrine: negative Allergic/Immunologic: negative   PHYSICAL EXAMINATION: General appearance: alert, cooperative, fatigued, and no distress Head: Normocephalic, without obvious abnormality, atraumatic Neck: no adenopathy, no JVD, supple, symmetrical, trachea midline, and thyroid not enlarged, symmetric, no tenderness/mass/nodules Lymph nodes: Cervical, supraclavicular, and axillary nodes normal. Resp: clear to auscultation bilaterally Back: symmetric, no curvature. ROM normal. No CVA tenderness. Cardio: regular rate and rhythm, S1, S2 normal, no murmur, click, rub or gallop GI: soft, non-tender; bowel sounds normal; no masses,  no organomegaly Extremities: extremities normal, atraumatic, no cyanosis or edema Neurologic: Alert and oriented X 3, normal strength and tone. Normal symmetric reflexes. Normal coordination and gait  ECOG PERFORMANCE STATUS: 1 - Symptomatic but completely ambulatory  Blood pressure 119/62, pulse 96, temperature (!) 97.4 F (36.3 C), temperature source Oral, resp. rate 18, weight 173 lb 8 oz (78.7 kg), SpO2 100 %.  LABORATORY DATA: Lab Results  Component Value Date   WBC 15.9 (H) 02/05/2023   HGB 8.9 (L) 02/05/2023   HCT 28.4 (L) 02/05/2023   MCV 91.0 02/05/2023   PLT 321 02/05/2023      Chemistry      Component Value Date/Time   NA 140 01/28/2023 1321   NA 140 06/07/2021 0952   K 4.7 01/28/2023 1321   CL 103 01/28/2023 1321   CO2 29 01/28/2023 1321   BUN 14 01/28/2023 1321   BUN 17 06/07/2021 0952   CREATININE 0.82 01/28/2023 1321   CREATININE 0.92 11/14/2016 1652      Component Value  Date/Time   CALCIUM 8.7 (L) 01/28/2023 1321   ALKPHOS 117 01/28/2023 1321   AST 17 01/28/2023 1321   ALT 13 01/28/2023 1321   BILITOT 0.3 01/28/2023 1321       RADIOGRAPHIC STUDIES: CT Chest W Contrast  Result Date: 02/03/2023 CLINICAL DATA:  Staging lung cancer on chemotherapy. * Tracking Code: BO * EXAM: CT CHEST, ABDOMEN, AND PELVIS WITH CONTRAST TECHNIQUE: Multidetector CT imaging of the chest, abdomen and pelvis was performed following the standard protocol during bolus administration of intravenous contrast. RADIATION DOSE REDUCTION: This exam was performed according to the departmental dose-optimization program which includes automated exposure control, adjustment of the mA and/or kV according to patient size and/or use of iterative reconstruction technique. CONTRAST:  16mL OMNIPAQUE IOHEXOL 300 MG/ML  SOLN COMPARISON:  CT angiogram chest and abdomen pelvis CT 12/12/2022. Older exams as well FINDINGS: CT CHEST FINDINGS Cardiovascular: Small pericardial effusion is increased from previous. The heart is nonenlarged. Coronary artery calcifications are seen. The thoracic aorta has some scattered partially calcified atherosclerotic plaque. Once again there is severe narrowing and poor enhancement of the right upper lobe pulmonary artery in the mediastinum and hilum consistent with encasing soft tissue mass. There is also narrowing of the SVC mildly by mediastinal mass lesions. Again this was seen previously. Mediastinum/Nodes: Several abnormal lymph nodes are seen. There is a new left supraclavicular node at the thoracic inlet the level of the left thyroid lobe measuring 16 by 12 mm on series 2 image 3. No specific other abnormal node enlargement in the axillary region. Bilateral abnormal hilar nodes are identified. On the left there is an increasing node measuring 2.4 x 1.6 cm. Previously there is abnormal tissue in this location measuring 1.6 by 1.1 cm. Confluence abnormal soft tissue seen in the  right side of the mediastinum. Area measured lower right paratracheal, precarinal on series 2, image 22 today measures 4.2 x 3.5 cm and previously 4.1 by 2.9 cm. Soft tissue nodule anterior to the SVC brachiocephalic vein junction on series 2, image 19 today measures 19 by 18 mm and previously would have measured 2.1 x 2.1 cm. There is also an abnormal lymph node along the internal mammary chain which is increased. Today on series 2, image 25 this measures 17 x 14 mm and previously 13 x 10 mm. There is also increasing nodes along the right anterior cardiophrenic space on image 47 and 48 of series 2. Normal caliber thoracic esophagus. Lungs/Pleura: Left lung is grossly clear. No consolidation, pneumothorax or effusion. Only minimal dependent atelectasis. There is increasing now moderate right pleural effusion with compressive atelectasis in the right lower lobe and some punctate calcifications. There is some interstitial thickening along the right upper lobe decreasing ground-glass. There are developing pleural nodules identified. Example medial right lower lobe on series 2, image 46 measuring 3.0 by 2.0 cm. Areas of pleural thickening posteriorly on image 35 of series 2. Additional thickening more caudal on series 2, image 50. Musculoskeletal: There is also associated involvement of the posterior aspect of the right ninth rib. Likely related to direct invasion by the pleural soft tissue extending into the chest wall on series 2, image 37. There is also a healing fracture of the posterior aspect of the right eleventh rib CT ABDOMEN PELVIS FINDINGS Hepatobiliary: Small hepatic cysts are identified. Focal fat deposition seen in the liver adjacent to the falciform ligament. Patent portal vein. The gallbladder is somewhat distended but unchanged from previous. Pancreas: Unremarkable. No pancreatic ductal dilatation or surrounding inflammatory changes. Spleen: Normal in size without focal abnormality. Adrenals/Urinary  Tract: Right adrenal gland is preserved. There are 2 left adrenal nodules which have increased in size. For example the more superior focus which previously measured 11 x 9 mm, today measures 18 by 13 mm. The other lesions also larger more caudal. Bosniak 1 bilateral renal cysts are stable. No specific follow-up. The ureters have normal course and caliber extending down to the bladder. Preserved contours of the urinary bladder. Stomach/Bowel: The large bowel has a normal course and caliber with diffuse colonic stool. Left-sided colonic diverticula. Normal appendix. The stomach and small bowel are nondilated except for a loop of small bowel in the midabdomen which has diameter of 3.3 cm. This  has a is mildly dilated. No abrupt transition there is small bowel stool appearance. This could be related to slow transit. Vascular/Lymphatic: Diffuse irregular atherosclerotic plaque along the aorta and iliac vessels with areas of stenosis. Preserved IVC. There is new abnormal lymph node seen towards the gastrohepatic ligament on series 2, image 56 measuring 15 by 12 mm today and previously less than 5 mm in short axis. Abnormal nodes seen retrocrural as well as posterior to the intrahepatic IVC on image 53. Reproductive: Prostate is unremarkable. Other: Mild anasarca.  No ascites. 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 There are destructive lytic bone lesions once again identified along the pelvis including the left iliac bone on image 95 and posterior medial right iliac bone such as image 91. These are increasing in size and extent. This there are also lesions developing along the lumbar spine. IMPRESSION: Overall progression of disease. 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 In addition there is one area which appears to extend posteriorly through the chest wall and involve the right ninth rib. Increased lymph nodes identified in the mediastinum,  retrocrural, upper abdomen and left side thoracic inlet. 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 Once again there is a dilated gallbladder. If there is concern of gallbladder pathology additional workup with ultrasound as clinically appropriate. Solitary loop of small bowel which is mildly dilated in the left midabdomen without a focal transition. Nonspecific. Findings will be called to the ordering service by the Radiology physician assistant team Electronically Signed   By: Jill Side M.D.   On: 02/03/2023 10:40   CT Abdomen Pelvis W Contrast  Result Date: 02/03/2023 CLINICAL DATA:  Staging lung cancer on chemotherapy. * Tracking Code: BO * EXAM: CT CHEST, ABDOMEN, AND PELVIS WITH CONTRAST TECHNIQUE: Multidetector CT imaging of the chest, abdomen and pelvis was performed following the standard protocol during bolus administration of intravenous contrast. RADIATION DOSE REDUCTION: This exam was performed according to the departmental dose-optimization program which includes automated exposure control, adjustment of the mA and/or kV according to patient size and/or use of iterative reconstruction technique. CONTRAST:  123mL OMNIPAQUE IOHEXOL 300 MG/ML  SOLN COMPARISON:  CT angiogram chest and abdomen pelvis CT 12/12/2022. Older exams as well FINDINGS: CT CHEST FINDINGS Cardiovascular: Small pericardial effusion is increased from previous. The heart is nonenlarged. Coronary artery calcifications are seen. The thoracic aorta has some scattered partially calcified atherosclerotic plaque. Once again there is severe narrowing and poor enhancement of the right upper lobe pulmonary artery in the mediastinum and hilum consistent with encasing soft tissue mass. There is also narrowing of the SVC mildly by mediastinal mass lesions. Again this was seen previously. Mediastinum/Nodes: Several abnormal lymph nodes are seen. There is a new left supraclavicular node at the thoracic inlet  the level of the left thyroid lobe measuring 16 by 12 mm on series 2 image 3. No specific other abnormal node enlargement in the axillary region. Bilateral abnormal hilar nodes are identified. On the left there is an increasing node measuring 2.4 x 1.6 cm. Previously there is abnormal tissue in this location measuring 1.6 by 1.1 cm. Confluence abnormal soft tissue seen in the right side of the mediastinum. Area measured lower right paratracheal, precarinal on series 2, image 22 today measures 4.2 x 3.5 cm and previously 4.1 by 2.9 cm. Soft tissue nodule anterior to the SVC brachiocephalic vein junction on series 2, image 19 today measures 19 by 18 mm and previously would  have measured 2.1 x 2.1 cm. There is also an abnormal lymph node along the internal mammary chain which is increased. Today on series 2, image 25 this measures 17 x 14 mm and previously 13 x 10 mm. There is also increasing nodes along the right anterior cardiophrenic space on image 47 and 48 of series 2. Normal caliber thoracic esophagus. Lungs/Pleura: Left lung is grossly clear. No consolidation, pneumothorax or effusion. Only minimal dependent atelectasis. There is increasing now moderate right pleural effusion with compressive atelectasis in the right lower lobe and some punctate calcifications. There is some interstitial thickening along the right upper lobe decreasing ground-glass. There are developing pleural nodules identified. Example medial right lower lobe on series 2, image 46 measuring 3.0 by 2.0 cm. Areas of pleural thickening posteriorly on image 35 of series 2. Additional thickening more caudal on series 2, image 50. Musculoskeletal: There is also associated involvement of the posterior aspect of the right ninth rib. Likely related to direct invasion by the pleural soft tissue extending into the chest wall on series 2, image 37. There is also a healing fracture of the posterior aspect of the right eleventh rib CT ABDOMEN PELVIS  FINDINGS Hepatobiliary: Small hepatic cysts are identified. Focal fat deposition seen in the liver adjacent to the falciform ligament. Patent portal vein. The gallbladder is somewhat distended but unchanged from previous. Pancreas: Unremarkable. No pancreatic ductal dilatation or surrounding inflammatory changes. Spleen: Normal in size without focal abnormality. Adrenals/Urinary Tract: Right adrenal gland is preserved. There are 2 left adrenal nodules which have increased in size. For example the more superior focus which previously measured 11 x 9 mm, today measures 18 by 13 mm. The other lesions also larger more caudal. Bosniak 1 bilateral renal cysts are stable. No specific follow-up. The ureters have normal course and caliber extending down to the bladder. Preserved contours of the urinary bladder. Stomach/Bowel: The large bowel has a normal course and caliber with diffuse colonic stool. Left-sided colonic diverticula. Normal appendix. The stomach and small bowel are nondilated except for a loop of small bowel in the midabdomen which has diameter of 3.3 cm. This has a is mildly dilated. No abrupt transition there is small bowel stool appearance. This could be related to slow transit. Vascular/Lymphatic: Diffuse irregular atherosclerotic plaque along the aorta and iliac vessels with areas of stenosis. Preserved IVC. There is new abnormal lymph node seen towards the gastrohepatic ligament on series 2, image 56 measuring 15 by 12 mm today and previously less than 5 mm in short axis. Abnormal nodes seen retrocrural as well as posterior to the intrahepatic IVC on image 53. Reproductive: Prostate is unremarkable. Other: Mild anasarca.  No ascites. 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 There are destructive lytic bone lesions once again identified along the pelvis including the left iliac bone on image 95 and posterior medial right iliac bone such as image 91. These are  increasing in size and extent. This there are also lesions developing along the lumbar spine. IMPRESSION: Overall progression of disease. 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 In addition there is one area which appears to extend posteriorly through the chest wall and involve the right ninth rib. Increased lymph nodes identified in the mediastinum, retrocrural, upper abdomen and left side thoracic inlet. 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 Once again there is a dilated gallbladder. If there is concern of gallbladder pathology additional workup with ultrasound as clinically appropriate. Solitary loop  of small bowel which is mildly dilated in the left midabdomen without a focal transition. Nonspecific. Findings will be called to the ordering service by the Radiology physician assistant team Electronically Signed   By: Jill Side M.D.   On: 02/03/2023 10:40    ASSESSMENT AND PLAN: This is a very pleasant 72 years old white male with Stage IV (T1b, N3, M1 C) non-small cell lung cancer, adenocarcinoma presented with right upper lobe lung nodule in addition to mediastinal and right hilar adenopathy as well as right-sided pleural metastatic disease with malignant right pleural effusion and multiple osseous metastatic lesions and single hypermetabolic retroperitoneal lymph node diagnosed in December 2023. Molecular studies by foundation 1 showed positive KRAS G12C mutation and negative PD-L1 expression The patient underwent systemic chemotherapy with carboplatin for AUC of 5, Alimta 500 Mg/M2 and Keytruda 200 Mg IV every 3 weeks status post 3 cycles.  His treatment was discontinued today secondary to disease progression. The patient had repeat CT scan of the chest, abdomen and pelvis performed recently.  I personally and independently reviewed the scan images and discussed the result and showed the images to the patient and his family  today. Unfortunately his scan showed clear evidence for disease progression with development of moderate right pleural effusion and pleural metastasis in addition to extension to posterior chest wall and involvement of the right ninth rib with increased lymphadenopathy in the mediastinum, retrocrural, upper abdomen and left side of the thoracic inlet with increasing left-sided adrenal metastasis and increasing bone metastasis. I had a lengthy discussion with the patient and his family about his current disease status and treatment options.  I gave the patient the option of palliative care and hospice versus consideration of treatment with targeted therapy for the positive KRAS G12C mutation with Krazati (Adagrasib) 600 mg p.o. twice daily.  The patient and his family are interested in the treatment with [place] (Adagrasib) and I discussed with him the adverse effect of this treatment and he will also meet with the pharmacist for oral oncolytic for further education and also to help the patient with the approval of his medications. During his routine evaluation before starting Alfred Levins (Adagrasib), the patient had EKG that unfortunately showed evidence for acute myocardial infarction with a STEMI.  The quality of the EKG was not great but we will send him to the emergency department for further evaluation and to rule out acute myocardial infarction. For pain management, he will continue with the current pain medication with fentanyl patch as well as Dilaudid and gabapentin.  He is followed by the palliative care team and his dose will be adjusted for his pain level. I will increase his dose of prednisone to 20 mg p.o. daily as the patient has been feeling better on prednisone and he require higher dose if possible. I will see him back for follow-up visit in around 2-3 weeks for evaluation and management of any adverse effect of his targeted therapy. The patient was advised to call immediately if he has any  other concerning symptoms in the interval. The patient voices understanding of current disease status and treatment options and is in agreement with the current care plan.  All questions were answered. The patient knows to call the clinic with any problems, questions or concerns. We can certainly see the patient much sooner if necessary.  The total time spent in the appointment was 55 minutes.  Disclaimer: This note was dictated with voice recognition software. Similar sounding words can inadvertently be  transcribed and may not be corrected upon review.

## 2023-02-06 ENCOUNTER — Inpatient Hospital Stay (HOSPITAL_COMMUNITY): Payer: Medicare Other

## 2023-02-06 ENCOUNTER — Other Ambulatory Visit: Payer: Medicare Other

## 2023-02-06 ENCOUNTER — Inpatient Hospital Stay (HOSPITAL_COMMUNITY)
Admission: EM | Admit: 2023-02-06 | Discharge: 2023-02-16 | DRG: 951 | Disposition: E | Payer: Medicare Other | Attending: Internal Medicine | Admitting: Internal Medicine

## 2023-02-06 ENCOUNTER — Encounter: Payer: Self-pay | Admitting: Cardiology

## 2023-02-06 ENCOUNTER — Encounter (HOSPITAL_COMMUNITY): Payer: Self-pay

## 2023-02-06 ENCOUNTER — Telehealth: Payer: Self-pay | Admitting: Internal Medicine

## 2023-02-06 ENCOUNTER — Emergency Department (HOSPITAL_COMMUNITY): Payer: Medicare Other

## 2023-02-06 ENCOUNTER — Other Ambulatory Visit: Payer: Self-pay

## 2023-02-06 ENCOUNTER — Telehealth: Payer: Self-pay

## 2023-02-06 ENCOUNTER — Encounter (HOSPITAL_COMMUNITY): Admission: EM | Disposition: E | Payer: Self-pay | Source: Home / Self Care | Attending: Internal Medicine

## 2023-02-06 DIAGNOSIS — Z79899 Other long term (current) drug therapy: Secondary | ICD-10-CM

## 2023-02-06 DIAGNOSIS — R451 Restlessness and agitation: Secondary | ICD-10-CM | POA: Diagnosis not present

## 2023-02-06 DIAGNOSIS — E785 Hyperlipidemia, unspecified: Secondary | ICD-10-CM | POA: Diagnosis present

## 2023-02-06 DIAGNOSIS — I25119 Atherosclerotic heart disease of native coronary artery with unspecified angina pectoris: Secondary | ICD-10-CM | POA: Diagnosis not present

## 2023-02-06 DIAGNOSIS — Z9221 Personal history of antineoplastic chemotherapy: Secondary | ICD-10-CM

## 2023-02-06 DIAGNOSIS — K219 Gastro-esophageal reflux disease without esophagitis: Secondary | ICD-10-CM | POA: Diagnosis present

## 2023-02-06 DIAGNOSIS — Z789 Other specified health status: Secondary | ICD-10-CM

## 2023-02-06 DIAGNOSIS — E440 Moderate protein-calorie malnutrition: Secondary | ICD-10-CM | POA: Diagnosis present

## 2023-02-06 DIAGNOSIS — J181 Lobar pneumonia, unspecified organism: Secondary | ICD-10-CM | POA: Diagnosis present

## 2023-02-06 DIAGNOSIS — I213 ST elevation (STEMI) myocardial infarction of unspecified site: Secondary | ICD-10-CM | POA: Diagnosis not present

## 2023-02-06 DIAGNOSIS — F419 Anxiety disorder, unspecified: Secondary | ICD-10-CM | POA: Diagnosis not present

## 2023-02-06 DIAGNOSIS — G629 Polyneuropathy, unspecified: Secondary | ICD-10-CM

## 2023-02-06 DIAGNOSIS — Z7902 Long term (current) use of antithrombotics/antiplatelets: Secondary | ICD-10-CM | POA: Diagnosis not present

## 2023-02-06 DIAGNOSIS — Z818 Family history of other mental and behavioral disorders: Secondary | ICD-10-CM

## 2023-02-06 DIAGNOSIS — I959 Hypotension, unspecified: Secondary | ICD-10-CM | POA: Diagnosis not present

## 2023-02-06 DIAGNOSIS — I10 Essential (primary) hypertension: Secondary | ICD-10-CM | POA: Diagnosis present

## 2023-02-06 DIAGNOSIS — Z7982 Long term (current) use of aspirin: Secondary | ICD-10-CM

## 2023-02-06 DIAGNOSIS — Z515 Encounter for palliative care: Secondary | ICD-10-CM | POA: Diagnosis not present

## 2023-02-06 DIAGNOSIS — C7951 Secondary malignant neoplasm of bone: Secondary | ICD-10-CM | POA: Diagnosis present

## 2023-02-06 DIAGNOSIS — I2109 ST elevation (STEMI) myocardial infarction involving other coronary artery of anterior wall: Secondary | ICD-10-CM | POA: Diagnosis present

## 2023-02-06 DIAGNOSIS — I2119 ST elevation (STEMI) myocardial infarction involving other coronary artery of inferior wall: Principal | ICD-10-CM

## 2023-02-06 DIAGNOSIS — I6930 Unspecified sequelae of cerebral infarction: Secondary | ICD-10-CM

## 2023-02-06 DIAGNOSIS — Z87891 Personal history of nicotine dependence: Secondary | ICD-10-CM | POA: Diagnosis not present

## 2023-02-06 DIAGNOSIS — Z711 Person with feared health complaint in whom no diagnosis is made: Secondary | ICD-10-CM | POA: Diagnosis not present

## 2023-02-06 DIAGNOSIS — R52 Pain, unspecified: Secondary | ICD-10-CM | POA: Diagnosis present

## 2023-02-06 DIAGNOSIS — Z923 Personal history of irradiation: Secondary | ICD-10-CM | POA: Diagnosis not present

## 2023-02-06 DIAGNOSIS — Z6824 Body mass index (BMI) 24.0-24.9, adult: Secondary | ICD-10-CM | POA: Diagnosis not present

## 2023-02-06 DIAGNOSIS — Z7189 Other specified counseling: Secondary | ICD-10-CM

## 2023-02-06 DIAGNOSIS — C3491 Malignant neoplasm of unspecified part of right bronchus or lung: Secondary | ICD-10-CM | POA: Diagnosis present

## 2023-02-06 DIAGNOSIS — Z96641 Presence of right artificial hip joint: Secondary | ICD-10-CM | POA: Diagnosis present

## 2023-02-06 DIAGNOSIS — Z807 Family history of other malignant neoplasms of lymphoid, hematopoietic and related tissues: Secondary | ICD-10-CM

## 2023-02-06 DIAGNOSIS — I309 Acute pericarditis, unspecified: Secondary | ICD-10-CM | POA: Diagnosis present

## 2023-02-06 DIAGNOSIS — J90 Pleural effusion, not elsewhere classified: Secondary | ICD-10-CM | POA: Diagnosis present

## 2023-02-06 DIAGNOSIS — Z66 Do not resuscitate: Secondary | ICD-10-CM | POA: Diagnosis not present

## 2023-02-06 DIAGNOSIS — Z87442 Personal history of urinary calculi: Secondary | ICD-10-CM

## 2023-02-06 DIAGNOSIS — J910 Malignant pleural effusion: Secondary | ICD-10-CM | POA: Diagnosis present

## 2023-02-06 DIAGNOSIS — C799 Secondary malignant neoplasm of unspecified site: Secondary | ICD-10-CM | POA: Diagnosis present

## 2023-02-06 DIAGNOSIS — Z811 Family history of alcohol abuse and dependence: Secondary | ICD-10-CM

## 2023-02-06 DIAGNOSIS — I2510 Atherosclerotic heart disease of native coronary artery without angina pectoris: Secondary | ICD-10-CM | POA: Diagnosis present

## 2023-02-06 DIAGNOSIS — E441 Mild protein-calorie malnutrition: Secondary | ICD-10-CM | POA: Diagnosis present

## 2023-02-06 DIAGNOSIS — G893 Neoplasm related pain (acute) (chronic): Secondary | ICD-10-CM | POA: Diagnosis present

## 2023-02-06 DIAGNOSIS — Z888 Allergy status to other drugs, medicaments and biological substances status: Secondary | ICD-10-CM

## 2023-02-06 DIAGNOSIS — Z8249 Family history of ischemic heart disease and other diseases of the circulatory system: Secondary | ICD-10-CM

## 2023-02-06 LAB — CBC WITH DIFFERENTIAL/PLATELET
Abs Immature Granulocytes: 0.14 10*3/uL — ABNORMAL HIGH (ref 0.00–0.07)
Basophils Absolute: 0 10*3/uL (ref 0.0–0.1)
Basophils Relative: 0 %
Eosinophils Absolute: 0 10*3/uL (ref 0.0–0.5)
Eosinophils Relative: 0 %
HCT: 29.7 % — ABNORMAL LOW (ref 39.0–52.0)
Hemoglobin: 9.2 g/dL — ABNORMAL LOW (ref 13.0–17.0)
Immature Granulocytes: 1 %
Lymphocytes Relative: 3 %
Lymphs Abs: 0.6 10*3/uL — ABNORMAL LOW (ref 0.7–4.0)
MCH: 28.8 pg (ref 26.0–34.0)
MCHC: 31 g/dL (ref 30.0–36.0)
MCV: 93.1 fL (ref 80.0–100.0)
Monocytes Absolute: 1.6 10*3/uL — ABNORMAL HIGH (ref 0.1–1.0)
Monocytes Relative: 9 %
Neutro Abs: 16.5 10*3/uL — ABNORMAL HIGH (ref 1.7–7.7)
Neutrophils Relative %: 87 %
Platelets: 342 10*3/uL (ref 150–400)
RBC: 3.19 MIL/uL — ABNORMAL LOW (ref 4.22–5.81)
RDW: 20.1 % — ABNORMAL HIGH (ref 11.5–15.5)
WBC: 18.9 10*3/uL — ABNORMAL HIGH (ref 4.0–10.5)
nRBC: 0 % (ref 0.0–0.2)

## 2023-02-06 LAB — PROTIME-INR
INR: 1.1 (ref 0.8–1.2)
Prothrombin Time: 14.2 seconds (ref 11.4–15.2)

## 2023-02-06 LAB — BRAIN NATRIURETIC PEPTIDE: B Natriuretic Peptide: 348.7 pg/mL — ABNORMAL HIGH (ref 0.0–100.0)

## 2023-02-06 LAB — COMPREHENSIVE METABOLIC PANEL
ALT: 49 U/L — ABNORMAL HIGH (ref 0–44)
AST: 153 U/L — ABNORMAL HIGH (ref 15–41)
Albumin: 2.6 g/dL — ABNORMAL LOW (ref 3.5–5.0)
Alkaline Phosphatase: 84 U/L (ref 38–126)
Anion gap: 15 (ref 5–15)
BUN: 16 mg/dL (ref 8–23)
CO2: 26 mmol/L (ref 22–32)
Calcium: 8.9 mg/dL (ref 8.9–10.3)
Chloride: 91 mmol/L — ABNORMAL LOW (ref 98–111)
Creatinine, Ser: 1.26 mg/dL — ABNORMAL HIGH (ref 0.61–1.24)
GFR, Estimated: >60 mL/min (ref >60)
Glucose, Bld: 126 mg/dL — ABNORMAL HIGH (ref 70–99)
Potassium: 4.9 mmol/L (ref 3.5–5.1)
Sodium: 132 mmol/L — ABNORMAL LOW (ref 135–145)
Total Bilirubin: 0.6 mg/dL (ref 0.3–1.2)
Total Protein: 6 g/dL — ABNORMAL LOW (ref 6.5–8.1)

## 2023-02-06 LAB — D-DIMER, QUANTITATIVE: D-Dimer, Quant: 1.32 ug/mL-FEU — ABNORMAL HIGH (ref 0.00–0.50)

## 2023-02-06 LAB — TROPONIN I (HIGH SENSITIVITY): Troponin I (High Sensitivity): >24000 ng/L (ref <18)

## 2023-02-06 SURGERY — LEFT HEART CATH AND CORONARY ANGIOGRAPHY
Anesthesia: LOCAL

## 2023-02-06 MED ORDER — PROCHLORPERAZINE MALEATE 5 MG PO TABS: 10.0000 mg | ORAL_TABLET | Freq: Four times a day (QID) | ORAL | Status: DC | PRN

## 2023-02-06 MED ORDER — TRAZODONE HCL 50 MG PO TABS: 25.0000 mg | ORAL_TABLET | Freq: Every evening | ORAL | Status: DC | PRN

## 2023-02-06 MED ORDER — POLYETHYLENE GLYCOL 3350 17 G PO PACK
17.0000 g | PACK | Freq: Every day | ORAL | Status: DC
  Administered 2023-02-06: 17 g via ORAL
  Filled 2023-02-06: qty 1

## 2023-02-06 MED ORDER — GABAPENTIN 300 MG PO CAPS
900.0000 mg | ORAL_CAPSULE | Freq: Every day | ORAL | Status: DC
  Administered 2023-02-06: 900 mg via ORAL
  Filled 2023-02-06: qty 3

## 2023-02-06 MED ORDER — FENTANYL 75 MCG/HR TD PT72
1.0000 | MEDICATED_PATCH | TRANSDERMAL | Status: DC
  Administered 2023-02-06: 1 via TRANSDERMAL
  Filled 2023-02-06: qty 1

## 2023-02-06 MED ORDER — DIPHENHYDRAMINE HCL 12.5 MG/5ML PO ELIX: 12.5000 mg | ORAL_SOLUTION | Freq: Four times a day (QID) | ORAL | Status: DC | PRN

## 2023-02-06 MED ORDER — SODIUM CHLORIDE 0.9 % IV SOLN: INTRAVENOUS | Status: DC

## 2023-02-06 MED ORDER — CLOPIDOGREL BISULFATE 75 MG PO TABS
75.0000 mg | ORAL_TABLET | Freq: Every day | ORAL | Status: DC
  Filled 2023-02-06: qty 1

## 2023-02-06 MED ORDER — DIPHENHYDRAMINE HCL 50 MG/ML IJ SOLN: 12.5000 mg | Freq: Four times a day (QID) | INTRAMUSCULAR | Status: DC | PRN

## 2023-02-06 MED ORDER — ALLOPURINOL 100 MG PO TABS
300.0000 mg | ORAL_TABLET | Freq: Every morning | ORAL | Status: DC
  Filled 2023-02-06 (×2): qty 3

## 2023-02-06 MED ORDER — AMLODIPINE BESYLATE 5 MG PO TABS: 5.0000 mg | ORAL_TABLET | Freq: Every morning | ORAL | Status: DC

## 2023-02-06 MED ORDER — FE FUM-VIT C-VIT B12-FA 460-60-0.01-1 MG PO CAPS
1.0000 | ORAL_CAPSULE | Freq: Every day | ORAL | Status: DC
  Filled 2023-02-06: qty 1

## 2023-02-06 MED ORDER — AZITHROMYCIN 250 MG PO TABS
500.0000 mg | ORAL_TABLET | Freq: Every day | ORAL | Status: DC
  Administered 2023-02-06: 500 mg via ORAL
  Filled 2023-02-06 (×2): qty 2

## 2023-02-06 MED ORDER — ENSURE ENLIVE PO LIQD
237.0000 mL | Freq: Three times a day (TID) | ORAL | Status: DC
  Administered 2023-02-06: 237 mL via ORAL

## 2023-02-06 MED ORDER — NALOXONE HCL 0.4 MG/ML IJ SOLN: 0.4000 mg | INTRAMUSCULAR | Status: DC | PRN

## 2023-02-06 MED ORDER — IOHEXOL 350 MG/ML SOLN
50.0000 mL | Freq: Once | INTRAVENOUS | Status: AC | PRN
  Administered 2023-02-06: 50 mL via INTRAVENOUS

## 2023-02-06 MED ORDER — MINOXIDIL 10 MG PO TABS
10.0000 mg | ORAL_TABLET | Freq: Two times a day (BID) | ORAL | Status: DC
  Filled 2023-02-06 (×2): qty 1

## 2023-02-06 MED ORDER — INTEGRA PLUS PO CAPS: 1.0000 | ORAL_CAPSULE | Freq: Every morning | ORAL | Status: DC

## 2023-02-06 MED ORDER — FOLIC ACID 1 MG PO TABS
1.0000 mg | ORAL_TABLET | Freq: Every day | ORAL | Status: DC
  Filled 2023-02-06: qty 1

## 2023-02-06 MED ORDER — SODIUM CHLORIDE 0.9 % IV SOLN
2.0000 g | INTRAVENOUS | Status: DC
  Administered 2023-02-06: 2 g via INTRAVENOUS
  Filled 2023-02-06: qty 20

## 2023-02-06 MED ORDER — METHYLPREDNISOLONE SODIUM SUCC 40 MG IJ SOLR: 40.0000 mg | Freq: Once | INTRAMUSCULAR | Status: DC

## 2023-02-06 MED ORDER — SENNA 8.6 MG PO TABS
1.0000 | ORAL_TABLET | Freq: Two times a day (BID) | ORAL | Status: DC
  Administered 2023-02-06: 8.6 mg via ORAL
  Filled 2023-02-06 (×3): qty 1

## 2023-02-06 MED ORDER — MAGNESIUM CITRATE PO SOLN: 1.0000 | Freq: Every day | ORAL | Status: DC | PRN

## 2023-02-06 MED ORDER — SODIUM CHLORIDE 0.9 % IV BOLUS
500.0000 mL | Freq: Once | INTRAVENOUS | Status: AC
  Administered 2023-02-06: 500 mL via INTRAVENOUS

## 2023-02-06 MED ORDER — SODIUM CHLORIDE 0.9% FLUSH: 9.0000 mL | INTRAVENOUS | Status: DC | PRN

## 2023-02-06 MED ORDER — GABAPENTIN 300 MG PO CAPS
600.0000 mg | ORAL_CAPSULE | Freq: Every day | ORAL | Status: DC
  Filled 2023-02-06: qty 2

## 2023-02-06 MED ORDER — ACETAMINOPHEN 500 MG PO TABS
1000.0000 mg | ORAL_TABLET | Freq: Three times a day (TID) | ORAL | Status: DC
  Administered 2023-02-06 (×2): 1000 mg via ORAL
  Filled 2023-02-06 (×2): qty 2

## 2023-02-06 MED ORDER — METOPROLOL SUCCINATE ER 25 MG PO TB24: 25.0000 mg | ORAL_TABLET | Freq: Two times a day (BID) | ORAL | Status: DC

## 2023-02-06 MED ORDER — ONDANSETRON HCL 4 MG/2ML IJ SOLN
4.0000 mg | Freq: Four times a day (QID) | INTRAMUSCULAR | Status: DC | PRN
  Administered 2023-02-07: 4 mg via INTRAVENOUS
  Filled 2023-02-06: qty 2

## 2023-02-06 MED ORDER — HYDROMORPHONE HCL 1 MG/ML IJ SOLN
2.0000 mg | Freq: Once | INTRAMUSCULAR | Status: AC
  Administered 2023-02-06: 2 mg via INTRAVENOUS
  Filled 2023-02-06: qty 2

## 2023-02-06 MED ORDER — ADAGRASIB 200 MG PO TABS: 600.0000 mg | ORAL_TABLET | Freq: Two times a day (BID) | ORAL | Status: DC

## 2023-02-06 MED ORDER — SODIUM CHLORIDE 0.9 % IV SOLN: 500.0000 mg | INTRAVENOUS | Status: DC

## 2023-02-06 MED ORDER — IRBESARTAN 300 MG PO TABS: 300.0000 mg | ORAL_TABLET | Freq: Every day | ORAL | Status: DC

## 2023-02-06 MED ORDER — ONDANSETRON 4 MG PO TBDP: 8.0000 mg | ORAL_TABLET | Freq: Three times a day (TID) | ORAL | Status: DC | PRN

## 2023-02-06 MED ORDER — ASPIRIN 325 MG PO TABS
325.0000 mg | ORAL_TABLET | Freq: Every day | ORAL | Status: DC
  Administered 2023-02-06: 325 mg via ORAL
  Filled 2023-02-06 (×2): qty 1

## 2023-02-06 MED ORDER — ENOXAPARIN SODIUM 40 MG/0.4ML IJ SOSY
40.0000 mg | PREFILLED_SYRINGE | INTRAMUSCULAR | Status: DC
  Administered 2023-02-06: 40 mg via SUBCUTANEOUS
  Filled 2023-02-06: qty 0.4

## 2023-02-06 MED ORDER — LORATADINE 10 MG PO TABS
10.0000 mg | ORAL_TABLET | Freq: Every morning | ORAL | Status: DC
  Filled 2023-02-06: qty 1

## 2023-02-06 MED ORDER — HYDROMORPHONE 1 MG/ML IV SOLN
INTRAVENOUS | Status: DC
  Administered 2023-02-06 (×2): 30 mg via INTRAVENOUS
  Administered 2023-02-07: 1.8 mg via INTRAVENOUS
  Filled 2023-02-06: qty 30

## 2023-02-06 MED ORDER — METHYLPREDNISOLONE SODIUM SUCC 125 MG IJ SOLR
125.0000 mg | Freq: Once | INTRAMUSCULAR | Status: AC
  Administered 2023-02-06: 125 mg via INTRAVENOUS
  Filled 2023-02-06: qty 2

## 2023-02-06 MED ORDER — METHYLPREDNISOLONE SODIUM SUCC 125 MG IJ SOLR
80.0000 mg | INTRAMUSCULAR | Status: DC
  Administered 2023-02-07: 80 mg via INTRAVENOUS
  Filled 2023-02-06: qty 2

## 2023-02-06 MED ORDER — PANTOPRAZOLE SODIUM 40 MG PO TBEC
40.0000 mg | DELAYED_RELEASE_TABLET | Freq: Two times a day (BID) | ORAL | Status: DC
  Filled 2023-02-06: qty 1

## 2023-02-06 NOTE — Consult Note (Cosign Needed Addendum)
Consultation Note Date: 01/17/2023   [name]  DOB: [birth year]  MRN: [id number]  Age / Sex: 72 y.o., male  PCP: Michael Boston, MD Referring Physician: Neena Rhymes, MD  Reason for Consultation: Pain control  HPI/Patient Profile: 72 y.o. 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 Recent imaging revealed progressive disease with lytic lesion to 9th rib and progressive lymphadenopathy. Dr. Earlie Server saw Thomas [name] 02/05/23 at which time his IV chemo was stopped and he was to start on [place] pending financial assistance. Patient presented to ED on 02/05/23 from home with chest pain - work up revealed STEMI and after discussions with Cardiology patient opted to discharge home to enjoy his birthday with family and not pursue aggressive interventions. Patient returned to ED on 02/13/2023 from home with continued complaints of chest pain not managed by home regimen. Patient was admitted on 02/05/2023 with chest pain due to CAD, pneumonia, adenocarcinoma of right lung, intractable pain, pleural effusions.   Of note, patient is followed by outpatient Palliative Care at [facility] for symptom management - most recent appointment 01/21/23. Prior to hospitalization, patient's pain control regimen included: scheduled tylenol, 75 mcg fentanyl patch, low dose prednisone, 8mg  PO dilaudid PRN, gabapentin.  Clinical Assessment and Goals of Care:  Received updates from Barrie Folk NP at [facility].  I have reviewed medical records including EPIC notes, labs, and imaging. Received report from primary RN - no acute concerns.  RN reports patient is only on oxygen for comfort; room air saturations were adequate.  Went to visit patient at bedside - wife/[name], daughter, and son in law present. Patient was lying in bed awake, alert, oriented, and able to  participate in conversation. No signs or non-verbal gestures of pain or discomfort noted. No respiratory distress, increased work of breathing, or secretions noted. Patient reports current pain 6/10 compared to admission 10/10. He is on 4L O2 [place].  Met with patient and his family  to discuss diagnosis, prognosis, GOC, EOL wishes, disposition, and options.  I re-introduced Palliative Medicine as specialized medical care for people living with serious illness. It focuses on providing relief from the symptoms and stress of a serious illness. The goal is to improve quality of life for both the patient and the family.  We discussed patient's current illness and what it means in the larger context of patient's on-going co-morbidities. Patient and his family have a clear understanding of his current acute medical situation. Patient expresses his goals for admission are pain control, he does not desire extensive work up for other acute issues.   Thomas [name] unfortunately reports he did not enjoy his birthday yesterday due to pain.  Patient reports his pain was adequately managed at home prior to acute events which lead to his ED visits. We reviewed in detail his elevated pain is likely multifactorial from post-MI inflammation, infection, pleural effusion in context of progressive cancer. Reviewed plan of care in detail as well as medication adjustments aimed at pain control: antibiotics, IV steroids, PCA pump. PCA use education provided. Thomas [name] presses PCA button once after education, which makes him drowsy. He reports his pain is well managed at this time. Discussed will continue this regimen in conjunction with home medications: fentanyl patch, scheduled tylenol, gabapentin. Reviewed with patient and family that at this time it is difficult to know if home medications need to be adjusted as pain may improve with treatment of  acute inflammation, infection, and possible thoracentesis. PMT will monitor symptoms  over the coming days and make recommendations pending clinical course - patient and family are agreeable and express appreciation.  Patient raises questions about constipation regarding increased opioid use. We reviewed his history of diarrhea. His last BM was this morning 3/22. Patient is already on senna and miralax - explained we will continue this regimen for now as to try to prevent diarrhea, but can certainly make adjustments if needed.   Time was spent reviewing other active medications per patient/family's request, including Lovenox and Claritin.  Patient tells me "the big thing this morning was breathing." He feels oxygen provided to him has significantly helped him breath easier. He does not wear oxygen at home; however, sometimes feels as if it could benefit from it.   Patient and family express their goal is for him to discharge home with hospice support. Patient wants to "go home and manage pain there." Provided education and counseling at length on the philosophy and benefits of hospice care. Discussed that it offers a holistic approach to care in the setting of end-stage illness, and is about supporting the patient where they are allowing nature to take it's course. Discussed the hospice team includes RNs, physicians, social workers, and chaplains. They can provide personal care, support for the family, and help keep patient out of the hospital as well as assist with DME needs for home hospice. Patient and family are agreeable for hospice referral prior to discharge - they would like oxygen for home use, no other DME needs at this time.  Discussed with patient/family the importance of continued conversation with each other and the medical providers regarding overall plan of care and treatment options, ensuring decisions are within the context of the patient's values and GOCs.    Questions and concerns were addressed. The patient/family was encouraged to call with questions and/or concerns.  PMT card was provided.   Primary Decision Maker: PATIENT    SUMMARY OF RECOMMENDATIONS   Continue current treatment for goal of improved pain control. Patient does not wish for extensive work up for other acute issues Continue DNR/DNI as previously documented Patient's pain was adequately managed at the time of this consult. Continue dilaudid PCA, IV steroids, and antibiotics in conjunction with home regimen of fentanyl patch, gabapentin, scheduled tylenol. If pleural effusion worsens, patient is open to thoracentesis. It is difficult to know if home medications need to be adjusted as pain may improve with treatment of acute inflammation, infection, and possible thoracentesis. PMT will monitor symptoms and make recommendations pending clinical course TOC consulted for: home hospice referral; patient/family requesting [facility]. Needs home oxygen for comfort PMT will continue to follow and support holistically   Code Status/Advance Care Planning: DNR  Palliative Prophylaxis:  Aspiration, Bowel Regimen, Frequent Pain Assessment, Oral Care, and Turn Reposition  Additional Recommendations (Limitations, Scope, Preferences): Full Scope Treatment, No Artificial Feeding, No Surgical Procedures, and No Tracheostomy  Psycho-social/Spiritual:  Desire for further Chaplaincy support:no Created space and opportunity for patient and family to express thoughts and feelings regarding patient's current medical situation.  Emotional support and therapeutic listening provided.  Prognosis:  < 6 months  Discharge Planning: Home with Home Health      Primary Diagnoses: Present on Admission:  Chest pain due to coronary artery disease ([place])  Essential hypertension  Adenocarcinoma of right lung, stage 4 (HCC)  Mild protein malnutrition (HCC)  Intractable pain  Lobar pneumonia ([place])   I have reviewed the  medical record, interviewed the patient and family, and examined the patient. The following  aspects are pertinent.  Past Medical History:  Diagnosis Date   Abdominal wall pain 04/02/2021   Acute ischemic stroke ([place]) 03/22/2020   Avulsed toenail, initial encounter 05/04/2018   Dyspnea on exertion 02/10/2020   ED (erectile dysfunction)    Epidermoid cyst of neck 10/16/2017   Essential hypertension 07/21/2017   GERD (gastroesophageal reflux disease)    Gout of multiple sites 07/21/2017   History of kidney stones    History of radiation therapy    Right Chest, Right Pelvis- 10/30/22-11/13/22- Sr. 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   Urgency of urination    URI (upper respiratory infection) 11/08/2014   Wears glasses    Social History   Socioeconomic History   Marital status: Married    Spouse name: [name]   Number of children: 2   Years of education: Not on file   Highest education level: Not on file  Occupational History   Occupation: Pharmacist, hospital  Tobacco Use   Smoking status: Former    Packs/day: 0.30    Years: 20.00    Additional pack years: 0.00    Total pack years: 6.00    Types: Cigarettes    Quit date: 10/17/1994    Years since quitting: 28.3   Smokeless tobacco: Never  Vaping Use   Vaping Use: Not on file  Substance and Sexual Activity   Alcohol use: Not Currently   Drug use: No   Sexual activity: Yes  Other Topics Concern   Not on file  Social History Narrative   Exercise 1 hour a day ---3-4 days a week   Social Determinants of Health   Financial Resource Strain: Not on file  Food Insecurity: No Food Insecurity  (12/12/2022)   Hunger Vital Sign    Worried About Running Out of Food in the Last Year: Never true    Ran Out of Food in the Last Year: Never true  Transportation Needs: No Transportation Needs (12/12/2022)   PRAPARE - Hydrologist (Medical): No    Lack of Transportation (Non-Medical): No  Physical Activity: Not on file  Stress: Not on file  Social Connections: Not on file   Family History  Problem Relation Age of Onset   Heart disease Mother 83   Alcohol abuse Mother    Depression Mother    Hypertension Father    Sleep apnea Father    Cancer Sister        hodgkins, breast   Colon cancer Neg Hx    Pancreatic cancer Neg Hx    Rectal cancer Neg Hx    Stomach cancer Neg Hx    Esophageal cancer Neg Hx    Inflammatory bowel disease Neg Hx    Liver disease Neg Hx    Scheduled Meds:  acetaminophen  1,000 mg Oral Q8H   allopurinol  300 mg Oral q AM   [START ON 02/07/2023] amLODipine  5 mg Oral q AM  aspirin  325 mg Oral Daily   azithromycin  500 mg Oral Daily   [START ON 02/07/2023] clopidogrel  75 mg Oral Daily   enoxaparin (LOVENOX) injection  40 mg Subcutaneous Q24H   [START ON 02/07/2023] Fe Fum-Vit C-Vit B12-FA  1 capsule Oral QPC breakfast   fentaNYL  1 patch Transdermal Q72H   [START ON A999333 folic acid  1 mg Oral Daily   [START ON 02/07/2023] gabapentin  600 mg Oral Daily   gabapentin  900 mg Oral QHS   HYDROmorphone   Intravenous Q4H   [START ON 02/07/2023] irbesartan  300 mg Oral Daily   [START ON 02/07/2023] loratadine  10 mg Oral q AM   [START ON 02/07/2023] methylPREDNISolone (SOLU-MEDROL) injection  80 mg Intravenous Q24H   [START ON 02/07/2023] metoprolol succinate  25 mg Oral BID   minoxidil  10 mg Oral BID   pantoprazole  40 mg Oral BID AC   polyethylene glycol  17 g Oral Daily   senna  1 tablet Oral BID   Continuous Infusions:  sodium chloride 50 mL/hr at 02/07/2023 1314   cefTRIAXone (ROCEPHIN)  IV     PRN Meds:.diphenhydrAMINE  **OR** diphenhydrAMINE, magnesium citrate, naloxone **AND** sodium chloride flush, ondansetron (ZOFRAN) IV, prochlorperazine, traZODone Medications Prior to Admission:  Prior to Admission medications   Medication Sig Start Date End Date Taking? Authorizing Provider  fentaNYL (DURAGESIC) 12 MCG/HR Place 2 patches onto the skin every 3 (three) days. Use with 50 mcg patch for total of 75 mcg every 3 days   Yes [provider]  fentaNYL (DURAGESIC) 50 MCG/HR Place 1 patch onto the skin every 3 days. 01/20/23  Yes Pickenpack-Cousar, Carlena Sax, NP  acetaminophen (TYLENOL) 650 MG CR tablet Take 1,300 mg by mouth every 8 (eight) hours.    [provider]  adagrasib (KRAZATI) 200 MG tablet Take 3 tablets (600 mg total) by mouth 2 (two) times daily. 02/05/23   Curt Bears, MD  allopurinol (ZYLOPRIM) 300 MG tablet Take 300 mg by mouth in the morning. 05/14/20   [provider]  amLODipine (NORVASC) 5 MG tablet Take 1 tablet (5 mg total) by mouth daily. Patient taking differently: Take 5 mg by mouth in the morning. 06/06/22   Richardson Dopp T, PA-C  aspirin 81 MG chewable tablet Chew 1 tablet (81 mg total) by mouth daily. 02/05/23 03/07/23  Carmin Muskrat, MD  CLARITIN 10 MG tablet Take 10 mg by mouth in the morning.    [provider]  clopidogrel (PLAVIX) 75 MG tablet Take 1 tablet (75 mg total) by mouth daily. 02/05/23   Carmin Muskrat, MD  diphenoxylate-atropine (LOMOTIL) 2.5-0.025 MG tablet Take 1 tablet by mouth 4 (four) times daily as needed for diarrhea or loose stools. Take 2 tablets after the first stool, then take as directed. 01/14/23   Heilingoetter, Cassandra L, PA-C  FeFum-FePoly-FA-B Cmp-C-Biot (INTEGRA PLUS) CAPS Take 1 capsule by mouth every morning. 01/14/23   Heilingoetter, Cassandra L, PA-C  folic acid (FOLVITE) 1 MG tablet Take 1 tablet (1 mg total) by mouth daily. 11/26/22   Curt Bears, MD  gabapentin (NEURONTIN) 300 MG capsule Take 2 capsules (600mg )  daily and 3 capsules (900mg )  at bedtime 01/27/23   Pickenpack-Cousar, Carlena Sax, NP  HYDROmorphone (DILAUDID) 8 MG tablet Take 1 tablet (8 mg total) by mouth every 4 (four) hours as needed for severe pain. 01/22/23   Pickenpack-Cousar, Carlena Sax, NP  IMODIUM A-D 2 MG tablet Take 2 mg by  mouth every 4 (four) hours as needed for diarrhea or loose stools.    [provider]  lidocaine-prilocaine (EMLA) cream Apply to the Port-A-Cath site 30-60 minutes before treatment 12/25/22   Curt Bears, MD  metoprolol succinate (TOPROL-XL) 25 MG 24 hr tablet Take 1 tablet (25 mg total) by mouth in the morning and at bedtime. 02/05/23 03/07/23  Carmin Muskrat, MD  minoxidil (LONITEN) 10 MG tablet Take 1-1.5 tablets (10-15 mg total) by mouth 2 (two) times daily. TAKE 1 TABLET IN THE MORNING AND TAKE 1/2 TABLET AT NIGHT Patient taking differently: Take 10 mg by mouth See admin instructions. Take 10 mg by mouth in the morning and evening 09/12/22   Park Liter, MD  ondansetron (ZOFRAN-ODT) 8 MG disintegrating tablet Take 1 tablet (8 mg total) by mouth every 8 (eight) hours as needed for nausea or vomiting. 02/02/23   Pickenpack-Cousar, Carlena Sax, NP  pantoprazole (PROTONIX) 40 MG tablet Take 1 tablet (40 mg total) by mouth 2 (two) times daily. Patient taking differently: Take 40 mg by mouth 2 (two) times daily before a meal. 11/26/22   Pickenpack-Cousar, Carlena Sax, NP  polyethylene glycol (MIRALAX / GLYCOLAX) 17 g packet Take 17 g by mouth 2 (two) times daily. Patient taking differently: Take 17 g by mouth 2 (two) times daily as needed for mild constipation. 11/02/22   Antonieta Pert, MD  predniSONE (DELTASONE) 20 MG tablet Take 0.5 tablets (10 mg total) by mouth every morning. 02/05/23   Curt Bears, MD  PRESCRIPTION MEDICATION Apply 1 Application topically See admin instructions. Compounded pain cream ([place])- apply to the feet twice a day as needed for pain    [provider]   PRESCRIPTION MEDICATION CPAP- At bedtime    [provider]  prochlorperazine (COMPAZINE) 10 MG tablet Take 1 tablet (10 mg total) by mouth every 6 (six) hours as needed for nausea or vomiting. 01/01/23   Pickenpack-Cousar, Carlena Sax, NP  senna (SENOKOT) 8.6 MG TABS tablet Take 2 tablets (17.2 mg total) by mouth 2 (two) times daily. Patient not taking: Reported on 12/12/2022 11/27/22   Pickenpack-Cousar, Carlena Sax, NP  valsartan (DIOVAN) 320 MG tablet TAKE 1 TABLET BY MOUTH EVERY DAY Patient taking differently: Take 320 mg by mouth in the morning. 08/20/22   Park Liter, MD   Allergies  Allergen Reactions   Statins Other (See Comments)    Jaw tightness and severe muscle pain- Pravastatin    Review of Systems  Constitutional:  Positive for fatigue.  Respiratory:  Positive for shortness of breath.   Gastrointestinal:  Negative for nausea and vomiting.  Neurological:  Positive for weakness.  All other systems reviewed and are negative.   Physical Exam Vitals and nursing note reviewed.  Constitutional:      General: He is not in acute distress.    Appearance: He is ill-appearing.  Pulmonary:     Effort: No respiratory distress.  Skin:    General: Skin is warm and dry.  Neurological:     Mental Status: He is alert and oriented to person, place, and time.  Psychiatric:        Attention and Perception: Attention normal.        Behavior: Behavior is cooperative.        Cognition and Memory: Cognition and memory normal.     Vital Signs: BP 139/76   Pulse (!) 137   Temp 99 F (37.2 C) (Oral)   Resp 19   SpO2  100%  Pain Scale: 0-10 POSS *See Group Information*: 1-Acceptable,Awake and alert Pain Score: 8    SpO2: SpO2: 100 % O2 Device:SpO2: 100 % O2 Flow Rate: .O2 Flow Rate (L/min): 4.5 L/min  IO: Intake/output summary: No intake or output data in the 24 hours ending 02/10/2023 1544  LBM:   Baseline Weight:   Most recent weight:       Palliative  Assessment/Data: PPS 50-60%     Time In: 1545 Time Out: 1715 Time Total: 90 minutes  Greater than 50%  of this time was spent counseling and coordinating care related to the above assessment and plan.  Signed by: Lin Landsman, NP   Please contact Palliative Medicine Team phone at [phone] for questions and concerns.  For individual provider: See Amion  *Portions of this note are a verbal dictation therefore any spelling and/or grammatical errors are due to the "[place]" system interpretation.

## 2023-02-06 NOTE — Assessment & Plan Note (Signed)
Patient with Stage IV NSSC lung/adenocarcinoma. Followed by Dr. Julien Nordmann with last visit 02/05/23.Has failed chemotherapy with recent CT revealing progressive disease including lytic lesin 9th rib, progressive lymphadenopathy. All chemo stopped but was to start [place] but awaiting financial application for assistance. Has seen palliative care as outpatient for symptom management. He has been on duragesic patch recently increased to 75 mcq, low dose steroids, dilaudid 4mg  increased to 8 mg prn. He has had relatively good pain control until this admission.  Plan Pain management- see below  Palliative care consult  Establish with hospice for home care  Start [place] once finanacial barriers are dealt with.

## 2023-02-06 NOTE — Telephone Encounter (Signed)
Called for TOC, left VM to call back.

## 2023-02-06 NOTE — Consult Note (Signed)
CARDIOLOGY CONSULT NOTE  Patient ID: Thomas [name] MRN: [id number] DOB/AGE: 70-Jun-1952 70 y.o.  Admit date: 01/20/2023 Referring Physician: [facility] Reason for Consultation:  Chest pain  HPI:   72 y.o. Caucasian male  with stage IV (T1b, N3, M1 C) non-small cell lung cancer, adenocarcinoma, with multiple mets, now with recent myocardial infarction.   Please refer to my detailed note from 02/05/2023. Patient went home yesterday and celebrated his birthday with family. However, he had right sided pleuritic chest pain, radiating to right shoulder, also with some shortness of breath. EMS were called who activated STEMI code. However, reviewing my prior note, this was appropriately canceled by on call internationalist.  SBP reportedly 79 at some pint per EMS, but is back to >100 mmHg. Patient is fairly comfortable, but certainly seeks better pain control.   Past Medical History:  Diagnosis Date   Abdominal wall pain 04/02/2021   Acute ischemic stroke ([place]) 03/22/2020   Avulsed toenail, initial encounter 05/04/2018   Dyspnea on exertion 02/10/2020   ED (erectile dysfunction)    Epidermoid cyst of neck 10/16/2017   Essential hypertension 07/21/2017   GERD (gastroesophageal reflux disease)    Gout of multiple sites 07/21/2017   History of kidney stones    History of radiation therapy    Right Chest, Right Pelvis- 10/30/22-11/13/22- Sr. 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   Urgency of urination    URI (upper respiratory infection) 11/08/2014   Wears  glasses      Past Surgical History:  Procedure Laterality Date   COLONOSCOPY WITH PROPOFOL  2017   CYSTOSCOPY/RETROGRADE/URETEROSCOPY/STONE EXTRACTION WITH BASKET Left 04/16/2018   Procedure: CYSTOSCOPY/RETROGRADE/URETEROSCOPY/STONE EXTRACTION WITH BASKET/ HOLMIUM LASER LITHOTRIPSY/ STENT PLACEMENT;  Surgeon: Ardis Hughs, MD;  Location: [facility];  Service: Urology;  Laterality: Left;   HERNIA REPAIR Right 2022   inguinal   HOLMIUM LASER APPLICATION Left [id number]   Procedure: HOLMIUM LASER APPLICATION;  Surgeon: Ardis Hughs, MD;  Location: [facility];  Service: Urology;  Laterality: Left;   TOTAL HIP ARTHROPLASTY Right 06/30/2022   Procedure: RIGHT TOTAL HIP ARTHROPLASTY ANTERIOR APPROACH;  Surgeon: Leandrew Koyanagi, MD;  Location: [place];  Service: Orthopedics;  Laterality: Right;  3-C      Family History  Problem Relation Age of Onset   Heart disease Mother 25   Alcohol abuse Mother    Depression Mother    Hypertension Father    Sleep apnea Father    Cancer Sister        hodgkins, breast   Colon cancer Neg Hx    Pancreatic cancer Neg Hx    Rectal cancer Neg Hx    Stomach cancer Neg Hx    Esophageal cancer Neg Hx    Inflammatory bowel disease Neg Hx    Liver disease Neg Hx      Social History: Social History   Socioeconomic History   Marital status: Married    Spouse name: [name]   Number of children: 2   Years  of education: Not on file   Highest education level: Not on file  Occupational History   Occupation: teacher  Tobacco Use   Smoking status: Former    Packs/day: 0.30    Years: 20.00    Additional pack years: 0.00    Total pack years: 6.00    Types: Cigarettes    Quit date: 10/17/1994    Years since quitting: 28.3   Smokeless tobacco: Never  Vaping Use   Vaping Use: Not on file  Substance and Sexual Activity   Alcohol use: Not Currently   Drug use: No   Sexual activity: Yes  Other Topics Concern   Not  on file  Social History Narrative   Exercise 1 hour a day ---3-4 days a week   Social Determinants of Health   Financial Resource Strain: Not on file  Food Insecurity: No Food Insecurity (12/12/2022)   Hunger Vital Sign    Worried About Running Out of Food in the Last Year: Never true    Ran Out of Food in the Last Year: Never true  Transportation Needs: No Transportation Needs (12/12/2022)   PRAPARE - Hydrologist (Medical): No    Lack of Transportation (Non-Medical): No  Physical Activity: Not on file  Stress: Not on file  Social Connections: Not on file  Intimate Partner Violence: Not At Risk (12/12/2022)   Humiliation, Afraid, Rape, and Kick questionnaire    Fear of Current or Ex-Partner: No    Emotionally Abused: No    Physically Abused: No    Sexually Abused: No     (Not in a hospital admission)   Review of Systems  Cardiovascular:  Positive for chest pain (Pleuritic). Negative for dyspnea on exertion, leg swelling, palpitations and syncope.  Respiratory:  Positive for shortness of breath.       Physical Exam: Physical Exam Vitals and nursing note reviewed.  Constitutional:      General: He is not in acute distress. Neck:     Vascular: No JVD.  Cardiovascular:     Rate and Rhythm: Normal rate and regular rhythm.     Heart sounds: Normal heart sounds. No murmur heard. Pulmonary:     Effort: Pulmonary effort is normal.     Breath sounds: Normal breath sounds. No wheezing or rales.  Musculoskeletal:     Right lower leg: No edema.     Left lower leg: No edema.        Imaging/tests reviewed and independently interpreted: Lab Results: Labs from 02/05/2023 reviewed  Cardiac Studies:  Telemetry 01/25/2023: Sinus rhythm, frequent PACs  EKG 02/07/2023: Normal sinus rhythm with frequent PAC Inferior infarct, acute (RCA) Probable RV involvement, suggest recording right precordial leads >>> Acute MI <<< Similar to ECG  yesterday  Echocardiogram 02/05/2023::  1. Left ventricular ejection fraction, by estimation, is 55 to 60%. The  left ventricle has normal function. The left ventricle has no regional  wall motion abnormalities. Left ventricular diastolic function could not  be evaluated.   2. Right ventricular systolic function is normal. The right ventricular  size is normal.   3. The mitral valve is grossly normal. Trivial mitral valve  regurgitation.    Assessment & Recommendations:  72 y.o. Caucasian male  with stage IV (T1b, N3, M1 C) non-small cell lung cancer, adenocarcinoma, with multiple mets, now with recent myocardial infarction.   Patient possibly has post MI pericarditis. Low suspicion for mechanical complication. No AV block.  I discussed with  patient, wife [name], and ER physician Dr. Truett Mainland. I confirmed the goals of care with the patient which are unequivocally "pain control" I do not think there is any role for any invasive management. Consider admitting for pain control or discharge home with updated pain regimen.   Discussed interpretation of tests and management recommendations with the primary team     Nigel Mormon, MD Pager: [phone] Office: [phone]

## 2023-02-06 NOTE — ED Provider Notes (Signed)
[facility] Provider Note  CSN: [id number] Arrival date & time: 01/31/2023 [id number]  Chief Complaint(s) Chest Pain  HPI Thomas [name] is a 72 y.o. male history of prior stroke, GERD, metastatic non-small cell lung cancer presenting to the emergency department with chest pain.  Patient reports that he is having chest pain starting this morning, similar to the chest pain he had 2 days ago.  Notably, yesterday was evaluated in the emergency department for STEMI, concern for late presentation, given prognosis and echocardiogram performed yesterday, benefits of PCI were thought to be less than potential risks including need for DAPT.  Patient also declined any treatment.  He presents today because he is having progressive pain.  He reports his pain is uncontrolled on his home fentanyl patches and oral Dilaudid.  He denies any other symptoms such as shortness of breath, diaphoresis, nausea or vomiting.  He also reports other chronic cancer pain including abdominal pain.  Patient was activated as a STEMI per EMS but STEMI alert was canceled given evaluation yesterday.   Past Medical History Past Medical History:  Diagnosis Date   Abdominal wall pain 04/02/2021   Acute ischemic stroke ([place]) 03/22/2020   Avulsed toenail, initial encounter 05/04/2018   Dyspnea on exertion 02/10/2020   ED (erectile dysfunction)    Epidermoid cyst of neck 10/16/2017   Essential hypertension 07/21/2017   GERD (gastroesophageal reflux disease)    Gout of multiple sites 07/21/2017   History of kidney stones    History of radiation therapy    Right Chest, Right Pelvis- 10/30/22-11/13/22- Sr. 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   Urgency of urination    URI (upper respiratory infection) 11/08/2014   Wears glasses    Patient Active Problem List   Diagnosis Date Noted   Chest pain due to coronary artery disease ([place]) 01/27/2023   Precordial pain 02/05/2023   Diarrhea 01/14/2023   Cancer related pain 01/14/2023   Weight loss 01/14/2023   Anemia due to antineoplastic chemotherapy 01/14/2023   Dehydration 12/25/2022   Sepsis due to undetermined organism ([place]) 12/12/2022   Normocytic anemia 12/12/2022   AKI (acute kidney injury) ([place]) 12/12/2022   Mild protein malnutrition ([place]) 12/12/2022   Neutropenia ([place]) 12/10/2022   Adenocarcinoma of right lung, stage 4 ([place]) 11/26/2022   Lytic bone lesion of hip 10/28/2022   Penetrating atherosclerotic ulcer of aorta ([place]) 10/28/2022   Intractable pain 10/27/2022   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   Preoperative cardiovascular examination 06/05/2022   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   Primary osteoarthritis of left hip 03/19/2022   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   Chronic gouty arthritis 04/23/2021   Primary osteoarthritis 04/23/2021   Abdominal wall pain 04/02/2021   Dysphagia 04/02/2021   Right flank pain 04/02/2021   Wears glasses    Left ureteral calculus    Hypertension    Hyperlipidemia    History of squamous cell carcinoma excision    Chronic gout    Right sided  abdominal pain 01/14/2021   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/24/2020   Acute ischemic stroke ([place]) 03/22/2020   It band syndrome, left 03/19/2020   Metatarsalgia of both feet 03/19/2020   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 Authorizing Provider  acetaminophen (TYLENOL) 650 MG CR tablet Take 1,300 mg by mouth every 8 (eight) hours.    [provider]  adagrasib (KRAZATI) 200 MG tablet Take 3 tablets (600 mg total) by mouth 2 (two) times daily. 02/05/23   Curt Bears, MD  allopurinol (ZYLOPRIM) 300 MG tablet Take 300 mg by mouth in the morning. 05/14/20   [provider]  amLODipine (NORVASC) 5 MG tablet Take 1 tablet (5 mg total) by mouth daily. Patient taking differently: Take 5 mg by mouth in the morning. 06/06/22   Richardson Dopp T, PA-C  aspirin 81 MG chewable tablet Chew 1 tablet (81 mg total) by mouth daily. 02/05/23 03/07/23  Carmin Muskrat, MD  CLARITIN 10 MG tablet Take 10 mg by mouth in the morning.    [provider]  clopidogrel (PLAVIX) 75 MG tablet Take 1 tablet (75 mg total) by mouth daily. 02/05/23   Carmin Muskrat, MD  diphenoxylate-atropine (LOMOTIL) 2.5-0.025 MG tablet Take 1 tablet by mouth 4 (four) times daily as needed for diarrhea or loose stools. Take 2 tablets after the first stool, then take as directed. 01/14/23   Heilingoetter, Cassandra L, PA-C  FeFum-FePoly-FA-B Cmp-C-Biot (INTEGRA PLUS) CAPS Take 1 capsule by mouth every morning. 01/14/23   Heilingoetter, Cassandra L, PA-C  fentaNYL (DURAGESIC) 50 MCG/HR Place 1 patch onto the skin every 3 days. 01/20/23   Pickenpack-Cousar, Carlena Sax, NP  folic acid (FOLVITE) 1 MG tablet Take 1 tablet (1 mg total) by mouth daily. 11/26/22   Curt Bears, MD  gabapentin (NEURONTIN) 300 MG capsule Take 2 capsules (600mg ) daily and 3 capsules (900mg )  at bedtime 01/27/23   Pickenpack-Cousar, Carlena Sax, NP  HYDROmorphone (DILAUDID) 8 MG tablet Take 1 tablet (8 mg total) by mouth every 4 (four) hours as needed for severe pain. 01/22/23   Pickenpack-Cousar, Carlena Sax, NP  IMODIUM A-D 2 MG tablet Take 2 mg by mouth every 4 (four) hours as needed for  diarrhea or loose stools.    [provider]  lidocaine-prilocaine (EMLA) cream Apply to the Port-A-Cath site 30-60 minutes before treatment 12/25/22   Curt Bears, MD  metoprolol succinate (TOPROL-XL) 25 MG 24 hr tablet Take 1 tablet (25 mg total) by mouth in the morning and at bedtime. 02/05/23 03/07/23  Carmin Muskrat, MD  minoxidil (LONITEN) 10 MG tablet Take 1-1.5 tablets (10-15 mg total) by mouth 2 (two) times daily. TAKE 1 TABLET IN THE MORNING AND TAKE 1/2 TABLET AT NIGHT Patient taking differently: Take 10 mg by mouth See admin instructions. Take 10 mg by mouth in the morning and evening 09/12/22   Park Liter, MD  ondansetron (ZOFRAN-ODT) 8 MG disintegrating tablet Take 1 tablet (8 mg total) by mouth every 8 (eight) hours as needed for nausea or vomiting. 02/02/23   Pickenpack-Cousar, Carlena Sax, NP  pantoprazole (PROTONIX) 40 MG tablet Take 1 tablet (40 mg total) by mouth 2 (two) times daily. Patient taking differently: Take 40 mg by mouth 2 (two) times daily before a meal. 11/26/22   Pickenpack-Cousar, [employer]  N, NP  polyethylene glycol (MIRALAX / GLYCOLAX) 17 g packet Take 17 g by mouth 2 (two) times daily. Patient taking differently: Take 17 g by mouth 2 (two) times daily as needed for mild constipation. 11/02/22   Antonieta Pert, MD  predniSONE (DELTASONE) 20 MG tablet Take 0.5 tablets (10 mg total) by mouth every morning. 02/05/23   Curt Bears, MD  PRESCRIPTION MEDICATION Apply 1 Application topically See admin instructions. Compounded pain cream ([place])- apply to the feet twice a day as needed for pain    [provider]  PRESCRIPTION MEDICATION CPAP- At bedtime    [provider]  prochlorperazine (COMPAZINE) 10 MG tablet Take 1 tablet (10 mg total) by mouth every 6 (six) hours as needed for nausea or vomiting. 01/01/23   Pickenpack-Cousar, Carlena Sax, NP  senna (SENOKOT) 8.6 MG TABS tablet Take 2 tablets (17.2 mg total) by mouth 2 (two)  times daily. Patient not taking: Reported on 12/12/2022 11/27/22   Pickenpack-Cousar, Carlena Sax, NP  valsartan (DIOVAN) 320 MG tablet TAKE 1 TABLET BY MOUTH EVERY DAY Patient taking differently: Take 320 mg by mouth in the morning. 08/20/22   Park Liter, MD                                                                                                                                    Past Surgical History Past Surgical History:  Procedure Laterality Date   COLONOSCOPY WITH PROPOFOL  2017   CYSTOSCOPY/RETROGRADE/URETEROSCOPY/STONE EXTRACTION WITH BASKET Left 04/16/2018   Procedure: CYSTOSCOPY/RETROGRADE/URETEROSCOPY/STONE EXTRACTION WITH BASKET/ HOLMIUM LASER LITHOTRIPSY/ STENT PLACEMENT;  Surgeon: Ardis Hughs, MD;  Location: [facility];  Service: Urology;  Laterality: Left;   HERNIA REPAIR Right 2022   inguinal   HOLMIUM LASER APPLICATION Left [id number]   Procedure: HOLMIUM LASER APPLICATION;  Surgeon: Ardis Hughs, MD;  Location[facility];  Service: Urology;  Laterality: Left;   TOTAL HIP ARTHROPLASTY Right 06/30/2022   Procedure: RIGHT TOTAL HIP ARTHROPLASTY ANTERIOR APPROACH;  Surgeon: Leandrew Koyanagi, MD;  Location: [facility];  Service: Orthopedics;  Laterality: Right;  3-C   Family History Family History  Problem Relation Age of Onset   Heart disease Mother 58   Alcohol abuse Mother    Depression Mother    Hypertension Father    Sleep apnea Father    Cancer Sister        hodgkins, breast   Colon cancer Neg Hx    Pancreatic cancer Neg Hx    Rectal cancer Neg Hx    Stomach cancer Neg Hx    Esophageal cancer Neg Hx    Inflammatory bowel disease Neg Hx    Liver disease Neg Hx     Social History Social History   Tobacco Use   Smoking status: Former    Packs/day: 0.30    Years: 20.00    Additional pack years:  0.00    Total pack years: 6.00    Types: Cigarettes    Quit date: 10/17/1994    Years since quitting: 28.3    Smokeless tobacco: Never  Substance Use Topics   Alcohol use: Not Currently   Drug use: No   Allergies Statins  Review of Systems Review of Systems  All other systems reviewed and are negative.   Physical Exam Vital Signs  I have reviewed the triage vital signs BP (!) 91/47   Pulse 84   Temp 98.2 F (36.8 C) (Oral)   Resp 19   SpO2 96%  Physical Exam Vitals and nursing note reviewed.  Constitutional:      General: He is not in acute distress.    Appearance: Normal appearance.  HENT:     Mouth/Throat:     Mouth: Mucous membranes are moist.  Eyes:     Conjunctiva/sclera: Conjunctivae normal.  Cardiovascular:     Rate and Rhythm: Normal rate and regular rhythm.  Pulmonary:     Effort: Pulmonary effort is normal. No respiratory distress.     Breath sounds: Normal breath sounds.  Abdominal:     General: Abdomen is flat.     Palpations: Abdomen is soft.     Tenderness: There is no abdominal tenderness.  Musculoskeletal:     Right lower leg: No edema.     Left lower leg: No edema.  Skin:    General: Skin is warm and dry.     Capillary Refill: Capillary refill takes less than 2 seconds.  Neurological:     Mental Status: He is alert and oriented to person, place, and time. Mental status is at baseline.  Psychiatric:        Mood and Affect: Mood normal.        Behavior: Behavior normal.     ED Results and Treatments Labs (all labs ordered are listed, but only abnormal results are displayed) Labs Reviewed  COMPREHENSIVE METABOLIC PANEL  CBC WITH DIFFERENTIAL/PLATELET  PROTIME-INR  BRAIN NATRIURETIC PEPTIDE  TROPONIN I (HIGH SENSITIVITY)                                                                                                                          Radiology ECHOCARDIOGRAM LIMITED  Result Date: 02/05/2023    ECHOCARDIOGRAM LIMITED REPORT   Patient Name:   WINSOR ESS Date of Exam: 02/05/2023 Medical Rec #:  [id number]      Height:       71.0 in  Accession #:    [id number]     Weight:       174.2 lb Date of Birth:  1951/02/07      BSA:          1.988 m Patient Age:    80 years       BP:           116/64 mmHg Patient Gender: M  HR:           127 bpm. Exam Location:  Inpatient Procedure: Limited Echo and Limited Color Doppler STAT ECHO Indications:     Chest Pain  History:         Patient has prior history of Echocardiogram examinations, most                  recent 03/23/2020. Stroke, Signs/Symptoms:Dyspnea and Chest Pain;                  Risk Factors:Hypertension and Dyslipidemia. Cancer, hx of                  radiation.  Sonographer:     Eartha Inch Referring Phys:  [id number] [place] J PATWARDHAN Diagnosing Phys: Vernell Leep MD IMPRESSIONS  1. Left ventricular ejection fraction, by estimation, is 55 to 60%. The left ventricle has normal function. The left ventricle has no regional wall motion abnormalities. Left ventricular diastolic function could not be evaluated.  2. Right ventricular systolic function is normal. The right ventricular size is normal.  3. The mitral valve is grossly normal. Trivial mitral valve regurgitation. FINDINGS  Left Ventricle: Left ventricular ejection fraction, by estimation, is 55 to 60%. The left ventricle has normal function. The left ventricle has no regional wall motion abnormalities. The left ventricular internal cavity size was normal in size. There is  no left ventricular hypertrophy. Left ventricular diastolic function could not be evaluated. Right Ventricle: The right ventricular size is normal. No increase in right ventricular wall thickness. Right ventricular systolic function is normal. Pericardium: There is no evidence of pericardial effusion. Mitral Valve: The mitral valve is grossly normal. Trivial mitral valve regurgitation. Tricuspid Valve: The tricuspid valve is normal in structure. Tricuspid valve regurgitation is not demonstrated. No evidence of tricuspid stenosis. Aorta: The aortic root is  normal in size and structure. Additional Comments: Color Doppler performed.  LEFT VENTRICLE PLAX 2D LVIDd:         4.30 cm LVIDs:         3.00 cm LV PW:         0.90 cm LV IVS:        1.00 cm LVOT diam:     2.20 cm LVOT Area:     3.80 cm  LEFT ATRIUM         Index LA diam:    3.20 cm 1.61 cm/m   AORTA Ao Root diam: 3.20 cm  SHUNTS Systemic Diam: 2.20 cm Vernell Leep MD Electronically signed by Vernell Leep MD Signature Date/Time: 02/05/2023/12:29:53 PM    Final     Pertinent labs & imaging results that were available during my care of the patient were reviewed by me and considered in my medical decision making (see MDM for details).  Medications Ordered in ED Medications  enoxaparin (LOVENOX) injection 40 mg (has no administration in time range)  0.9 %  sodium chloride infusion (has no administration in time range)  traZODone (DESYREL) tablet 25 mg (has no administration in time range)  senna (SENOKOT) tablet 8.6 mg (has no administration in time range)  naloxone (NARCAN) injection 0.4 mg (has no administration in time range)    And  sodium chloride flush (NS) 0.9 % injection 9 mL (has no administration in time range)  ondansetron (ZOFRAN) injection 4 mg (has no administration in time range)  diphenhydrAMINE (BENADRYL) injection 12.5 mg (has no administration in time range)    Or  diphenhydrAMINE (BENADRYL) 12.5  MG/5ML elixir 12.5 mg (has no administration in time range)  HYDROmorphone (DILAUDID) 1 mg/mL PCA injection (has no administration in time range)  methylPREDNISolone sodium succinate (SOLU-MEDROL) 125 mg/2 mL injection 125 mg (has no administration in time range)  methylPREDNISolone sodium succinate (SOLU-MEDROL) 40 mg/mL injection 40 mg (has no administration in time range)  acetaminophen (TYLENOL) CR tablet 1,300 mg (has no administration in time range)  allopurinol (ZYLOPRIM) tablet 300 mg (has no administration in time range)  adagrasib (KRAZATI) tablet 600 mg (has no  administration in time range)  amLODipine (NORVASC) tablet 5 mg (has no administration in time range)  metoprolol succinate (TOPROL-XL) 24 hr tablet 25 mg (has no administration in time range)  minoxidil (LONITEN) tablet 10 mg (has no administration in time range)  prochlorperazine (COMPAZINE) tablet 10 mg (has no administration in time range)  ondansetron (ZOFRAN-ODT) disintegrating tablet 8 mg (has no administration in time range)  pantoprazole (PROTONIX) EC tablet 40 mg (has no administration in time range)  clopidogrel (PLAVIX) tablet 75 mg (has no administration in time range)  Integra Plus CAPS 1 capsule (has no administration in time range)  folic acid (FOLVITE) tablet 1 mg (has no administration in time range)  gabapentin (NEURONTIN) capsule 300 mg (has no administration in time range)  loratadine (CLARITIN) tablet 10 mg (has no administration in time range)  irbesartan (AVAPRO) tablet 300 mg (has no administration in time range)  aspirin tablet 325 mg (has no administration in time range)  fentaNYL (DURAGESIC) 75 MCG/HR 1 patch (has no administration in time range)  HYDROmorphone (DILAUDID) injection 2 mg (2 mg Intravenous Given 01/23/2023 1051)                                                                                                                                     Procedures .Critical Care  Performed by: Cristie Hem, MD Authorized by: Cristie Hem, MD   Critical care provider statement:    Critical care time (minutes):  30   Critical care was necessary to treat or prevent imminent or life-threatening deterioration of the following conditions:  Cardiac failure   Critical care was time spent personally by me on the following activities:  Development of treatment plan with patient or surrogate, discussions with consultants, evaluation of patient's response to treatment, examination of patient, ordering and review of laboratory studies, pulse oximetry,  re-evaluation of patient's condition, review of old charts, ordering and performing treatments and interventions and ordering and review of radiographic studies   Care discussed with: admitting provider     (including critical care time)  Medical Decision Making / ED Course   MDM:  72 year old male presenting to the emergency department with chest pain.  Patient well-appearing, EKG does show inferior STEMI.  Patient was evaluated yesterday by Dr. Virgina Jock, who evaluated the patient again at bedside on arrival.  Differential includes ongoing ischemia, post MI pericarditis.  No  evidence of acute CHF to suggest mechanical complication of MI.  Less likely pneumothorax, pneumonia, pulmonary embolism.  No tachycardia or hypoxia to suggest acute pulmonary embolism.  Given STEMI on EKG, clear alternative cause for his pain.  Will treat pain.  Will consult palliative care.  Discussed CODE STATUS with the patient and his wife.  Patient would not like to be kept alive on any artificial ventilation such as endotracheal tube.  He would also not like any chest compressions were he to not have a pulse.  CODE STATUS changed in chart.  Will consult hospitalist for admission.  Clinical Course as of 02/02/2023 1129  Fri Feb 06, 2023  1122 Patient admitted by Adella Hare MD hospitalist. Patient had labs yesterday. Labs re-ordered, pending [WS]    Clinical Course User Index [WS] Cristie Hem, MD     Additional history obtained: -Additional history obtained from family and ems -External records from outside source obtained and reviewed including: Chart review including previous notes, labs, imaging, consultation notes including cardiology and ER notes from yesterday   Lab Tests: -I ordered, reviewed, and interpreted labs.   The pertinent results include:   Labs Reviewed  COMPREHENSIVE METABOLIC PANEL  CBC WITH DIFFERENTIAL/PLATELET  PROTIME-INR  BRAIN NATRIURETIC PEPTIDE  TROPONIN I (HIGH  SENSITIVITY)      EKG  Inferior stemi  Medicines ordered and prescription drug management: Meds ordered this encounter  Medications   HYDROmorphone (DILAUDID) injection 2 mg   enoxaparin (LOVENOX) injection 40 mg   0.9 %  sodium chloride infusion   traZODone (DESYREL) tablet 25 mg   senna (SENOKOT) tablet 8.6 mg   AND Linked Order Group    naloxone (NARCAN) injection 0.4 mg    sodium chloride flush (NS) 0.9 % injection 9 mL   ondansetron (ZOFRAN) injection 4 mg   OR Linked Order Group    diphenhydrAMINE (BENADRYL) injection 12.5 mg    diphenhydrAMINE (BENADRYL) 12.5 MG/5ML elixir 12.5 mg   HYDROmorphone (DILAUDID) 1 mg/mL PCA injection    Order Specific Question:   Dose:    Answer:   Dilaudid Full-Dose    Order Specific Question:   Loading Dose (in mg):    Answer:   0.5    Order Specific Question:   May repeat load q 10 minutes for 3 doses total:    Answer:   Yes    Order Specific Question:   PCA Patient Bolus Dose (in mg)    Answer:   0.3    Order Specific Question:   Lockout Interval (in minutes):    Answer:   8    Order Specific Question:   One Hour Dose Limit (in mg)    Answer:   1.25 mg    Order Specific Question:   Basal Infusion Rate    Answer:   No   methylPREDNISolone sodium succinate (SOLU-MEDROL) 125 mg/2 mL injection 125 mg    IV methylprednisolone will be converted to either a q12h or q24h frequency with the same total daily dose (TDD).  Ordered Dose: 1 to 125 mg TDD; convert to: TDD q24h.  Ordered Dose: 126 to 250 mg TDD; convert to: TDD div q12h.  Ordered Dose: >250 mg TDD; DAW.   methylPREDNISolone sodium succinate (SOLU-MEDROL) 40 mg/mL injection 40 mg    IV methylprednisolone will be converted to either a q12h or q24h frequency with the same total daily dose (TDD).  Ordered Dose: 1 to 125 mg TDD; convert to: TDD q24h.  Ordered Dose:  126 to 250 mg TDD; convert to: TDD div q12h.  Ordered Dose: >250 mg TDD; DAW.   acetaminophen (TYLENOL) CR tablet 1,300  mg   allopurinol (ZYLOPRIM) tablet 300 mg   adagrasib (KRAZATI) tablet 600 mg   amLODipine (NORVASC) tablet 5 mg   metoprolol succinate (TOPROL-XL) 24 hr tablet 25 mg   minoxidil (LONITEN) tablet 10 mg    OP [id number] 1 TABLET IN THE MORNING AND TAKE 1/2 TABLET AT NIGHT Patient taking differently: Take 10 mg by mouth in the morning and evening     prochlorperazine (COMPAZINE) tablet 10 mg   ondansetron (ZOFRAN-ODT) disintegrating tablet 8 mg   pantoprazole (PROTONIX) EC tablet 40 mg   clopidogrel (PLAVIX) tablet 75 mg   Integra Plus CAPS 1 capsule   folic acid (FOLVITE) tablet 1 mg   gabapentin (NEURONTIN) capsule 300 mg    Take 2 capsules (600mg ) daily and 3 capsules (900mg )  at bedtime     loratadine (CLARITIN) tablet 10 mg   irbesartan (AVAPRO) tablet 300 mg   aspirin tablet 325 mg   fentaNYL (DURAGESIC) 75 MCG/HR 1 patch    -I have reviewed the patients home medicines and have made adjustments as needed   Consultations Obtained: I requested consultation with the cardiologist,  and discussed lab and imaging findings as well as pertinent plan - they recommend: pain control   Cardiac Monitoring: The patient was maintained on a cardiac monitor.  I personally viewed and interpreted the cardiac monitored which showed an underlying rhythm of: NSR  Social Determinants of Health:  Diagnosis or treatment significantly limited by social determinants of health: former smoker   Reevaluation: After the interventions noted above, I reevaluated the patient and found that their symptoms have improved  Co morbidities that complicate the patient evaluation  Past Medical History:  Diagnosis Date   Abdominal wall pain 04/02/2021   Acute ischemic stroke ([place]) 03/22/2020   Avulsed toenail, initial encounter 05/04/2018   Dyspnea on exertion 02/10/2020   ED (erectile dysfunction)    Epidermoid cyst of neck 10/16/2017   Essential hypertension 07/21/2017   GERD (gastroesophageal reflux  disease)    Gout of multiple sites 07/21/2017   History of kidney stones    History of radiation therapy    Right Chest, Right Pelvis- 10/30/22-11/13/22- Sr. 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    Final Clinical Impression(s) / ED Diagnoses Final diagnoses:  ST elevation myocardial infarction (STEMI) of inferior wall ([place])  Uncontrolled pain  Metastatic malignant neoplasm, unspecified site [facility])     This chart was dictated using voice recognition software.  Despite best efforts to proofread,  errors can occur which can change the documentation meaning.    Cristie Hem, MD 01/18/2023 [phone]

## 2023-02-06 NOTE — ED Notes (Signed)
Cardiologist and EDP at bedside  

## 2023-02-06 NOTE — Subjective & Objective (Signed)
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 He has undergone aggressive therapy directed by Dr. Earlie Server. Recent imaging revealed progressive disease with lytic lesion to 9th rib and progressive lymphadenopathy. Dr. Earlie Server saw Thomas [name] 02/05/23 at which time his IV chemo was stopped and he was to start on Krazati. EKG revealed acute inferior STEMI and patient was sent to ED. Cardiology, Dr. 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 Echo revealed preserved EF and no wall motion abnormality. He is not a candidate for invasive intervention, I.e. Stenting, and was put to DAPT therapy. Since he was pain free he was felt stable to be sent home.  On the day of admission the patient had the onset of severe right sided chest pain uncontrolled by his home regimen of fentanyl patch and prn dilaudid. He presented to [facility] for evaluation.

## 2023-02-06 NOTE — Telephone Encounter (Signed)
Called patient to do a TOC but patient mention he was going back to the ER.

## 2023-02-06 NOTE — ED Notes (Signed)
Fall risk precautions placed. Patient refusing socks at this time. Per admitting hospitalist patient does not need telemetry monitor at this time.

## 2023-02-06 NOTE — Assessment & Plan Note (Addendum)
Patient in cancer clinic 02/05/23 had EKG revealing anterior acute MI although he did not report typical anginal chest pain. He was referred to ED and was seen by Cardiology, Dr. Virgina Jock. Patient not considered a candidate for invasive intervention and was advised to treat with DAPT-ASA/Plavix and to continue BB with metoprolol 25 mg BID. He was pain free and was discharged to home. Today, 01/28/2023 he had severe recurrent right chest pain and returned to [name]. 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 Dr. Virgina Jock had no new recommendations. He confirmed with the patient and wife to not do any invasive treatment. On PE no rub or clinical findings of pericarditis  Plan Admit to med-surg floor - with no intervention planned telemetry is not productive and patient prefers to not be on telemetry Continue present regimen of DAPT, cardiac meds  Pain management, including anti-inflammatory meds.

## 2023-02-06 NOTE — Assessment & Plan Note (Signed)
Poor PO intake lately and he has been losing some weight.  Plan RD consult

## 2023-02-06 NOTE — Telephone Encounter (Signed)
Scheduled per 03/21 los, patient has been called and notified. 

## 2023-02-06 NOTE — Assessment & Plan Note (Signed)
Long standing problem dating back to when he had a stroke.  Plan Continue gabapentin

## 2023-02-06 NOTE — H&P (Signed)
History and Physical    Thomas [name] [id number] DOB: [birth year] DOA: 01/16/2023  DOS: the patient was seen and examined on 01/31/2023  PCP:  [place], MD   Patient coming from: Home  I have personally briefly reviewed patient's old medical records in [place]  Mr. Amenta, a 72 y/o with multiple co-morbidities was diagnosed in Jan /23 with NSCCa lung. He has undergone aggressive therapy directed by Dr. Earlie Server. Recent imaging revealed progressive disease with lytic lesion to 9th rib and progressive lymphadenopathy. Dr. Earlie Server saw Mr. Toben 02/05/23 at which time his IV chemo was stopped and he was to start on Krazati. EKG revealed acute inferior STEMI and patient was sent to ED. Cardiology, Dr. 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 Echo revealed preserved EF and no wall motion abnormality. He is not a candidate for invasive intervention, I.e. Stenting, and was put to DAPT therapy. Since he was pain free he was felt stable to be sent home.  On the day of admission the patient had the onset of severe right sided chest pain uncontrolled by his home regimen of fentanyl patch and prn dilaudid. He presented to [facility].    ED Course: T 98.2 109/73 HR 88 RR 16. Patient was in severe pain. He was seen in reconsultation by Dr. Ann Maki with no change in recommendations. Lab: Na 131, glucose 127, AST 46, ALT 55, Troponin >24,400, WBC 20.4 with 91/2/6, Hgb 9.9, Plt 337. CXR compared to 02/05/23 film revealed increased pleural effusion and infiltrates RML. RLL. TRH called to admit the patient for pain control and management of a multi-factorial illness.   Review of Systems:  Review of Systems  Constitutional:  Positive for weight loss. Negative for chills and fever.  HENT: Negative.    Eyes: Negative.   Respiratory:  Positive for shortness of breath. Negative for cough, sputum production and wheezing.    Cardiovascular:  Positive for chest pain. Negative for palpitations, orthopnea and leg swelling.  Gastrointestinal: Negative.   Genitourinary:  Positive for dysuria and frequency.  Musculoskeletal: Negative.   Skin: Negative.   Neurological: Negative.   Endo/Heme/Allergies: Negative.   Psychiatric/Behavioral: Negative.      Past Medical History:  Diagnosis Date   Abdominal wall pain 04/02/2021   Acute ischemic stroke ([place]) 03/22/2020   Avulsed toenail, initial encounter 05/04/2018   Dyspnea on exertion 02/10/2020   ED (erectile dysfunction)    Epidermoid cyst of neck 10/16/2017   Essential hypertension 07/21/2017   GERD (gastroesophageal reflux disease)    Gout of multiple sites 07/21/2017   History of kidney stones    History of radiation therapy    Right Chest, Right Pelvis- 10/30/22-11/13/22- Sr. 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   Urgency of urination    URI (upper respiratory infection) 11/08/2014   Wears glasses     Past Surgical History:  Procedure Laterality Date   COLONOSCOPY WITH PROPOFOL  2017   CYSTOSCOPY/RETROGRADE/URETEROSCOPY/STONE EXTRACTION WITH BASKET Left 04/16/2018  Procedure: CYSTOSCOPY/RETROGRADE/URETEROSCOPY/STONE EXTRACTION WITH BASKET/ HOLMIUM LASER LITHOTRIPSY/ STENT PLACEMENT;  Surgeon: Ardis Hughs, MD;  Location: [facility];  Service: Urology;  Laterality: Left;   HERNIA REPAIR Right 2022   inguinal   HOLMIUM LASER APPLICATION Left [id number]   Procedure: HOLMIUM LASER APPLICATION;   Surgeon: Ardis Hughs, MD;  Location: [facility];  Service: Urology;  Laterality: Left;   TOTAL HIP ARTHROPLASTY Right 06/30/2022   Procedure: RIGHT TOTAL HIP ARTHROPLASTY ANTERIOR APPROACH;  Surgeon: Leandrew Koyanagi, MD;  Location: [place];  Service: Orthopedics;  Laterality: Right;  3-C    Soc Hx - married. He has a son 33 who is graduating college in May and attending graduation is a major goal for Mr. Muccio, a daughter 58 who lives in [place]. 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    reports that he quit smoking about 28 years ago. His smoking use included cigarettes. He has a 6.00 pack-year smoking history. He has never used smokeless tobacco. He reports that he does not currently use alcohol. He reports that he does not use drugs.  Allergies  Allergen Reactions   Statins Other (See Comments)    Jaw tightness and severe muscle pain- Pravastatin     Family History  Problem Relation Age of Onset   Heart disease Mother 62   Alcohol abuse Mother    Depression Mother    Hypertension Father    Sleep apnea Father    Cancer Sister        hodgkins, breast   Colon cancer Neg Hx    Pancreatic cancer Neg Hx    Rectal cancer Neg Hx    Stomach cancer Neg Hx    Esophageal cancer Neg Hx    Inflammatory bowel disease Neg Hx    Liver disease Neg Hx     Prior to Admission medications   Medication Sig Start Date End Date Taking? Authorizing Provider  acetaminophen (TYLENOL) 650 MG CR tablet Take 1,300 mg by mouth every 8 (eight) hours.    [provider]  adagrasib (KRAZATI) 200 MG tablet Take 3 tablets (600 mg total) by mouth 2 (two) times daily. 02/05/23   Curt Bears, MD  allopurinol (ZYLOPRIM) 300 MG tablet Take 300 mg by mouth in the morning. 05/14/20   [provider]  amLODipine (NORVASC) 5 MG tablet Take 1 tablet (5 mg total) by mouth daily. Patient taking differently: Take 5 mg by mouth in the morning. 06/06/22    Richardson Dopp T, PA-C  aspirin 81 MG chewable tablet Chew 1 tablet (81 mg total) by mouth daily. 02/05/23 03/07/23  Carmin Muskrat, MD  CLARITIN 10 MG tablet Take 10 mg by mouth in the morning.    [provider]  clopidogrel (PLAVIX) 75 MG tablet Take 1 tablet (75 mg total) by mouth daily. 02/05/23   Carmin Muskrat, MD  diphenoxylate-atropine (LOMOTIL) 2.5-0.025 MG tablet Take 1 tablet by mouth 4 (four) times daily as needed for diarrhea or loose stools. Take 2 tablets after the first stool, then take as directed. 01/14/23   Heilingoetter, Cassandra L, PA-C  FeFum-FePoly-FA-B Cmp-C-Biot (INTEGRA PLUS) CAPS Take 1 capsule by mouth every morning. 01/14/23   Heilingoetter, Cassandra L, PA-C  fentaNYL (DURAGESIC) 50 MCG/HR Place 1 patch onto the skin every 3 days. 01/20/23   Pickenpack-Cousar, Carlena Sax, NP  folic acid (FOLVITE) 1 MG tablet Take 1 tablet (1 mg total) by mouth daily. 11/26/22   Mohamed,  Julien Nordmann, MD  gabapentin (NEURONTIN) 300 MG capsule Take 2 capsules (600mg ) daily and 3 capsules (900mg )  at bedtime 01/27/23   Pickenpack-Cousar, Carlena Sax, NP  HYDROmorphone (DILAUDID) 8 MG tablet Take 1 tablet (8 mg total) by mouth every 4 (four) hours as needed for severe pain. 01/22/23   Pickenpack-Cousar, Carlena Sax, NP  IMODIUM A-D 2 MG tablet Take 2 mg by mouth every 4 (four) hours as needed for diarrhea or loose stools.    [provider]  lidocaine-prilocaine (EMLA) cream Apply to the Port-A-Cath site 30-60 minutes before treatment 12/25/22   Curt Bears, MD  metoprolol succinate (TOPROL-XL) 25 MG 24 hr tablet Take 1 tablet (25 mg total) by mouth in the morning and at bedtime. 02/05/23 03/07/23  Carmin Muskrat, MD  minoxidil (LONITEN) 10 MG tablet Take 1-1.5 tablets (10-15 mg total) by mouth 2 (two) times daily. TAKE 1 TABLET IN THE MORNING AND TAKE 1/2 TABLET AT NIGHT Patient taking differently: Take 10 mg by mouth See admin instructions. Take 10 mg by mouth in the morning and evening  09/12/22   Park Liter, MD  ondansetron (ZOFRAN-ODT) 8 MG disintegrating tablet Take 1 tablet (8 mg total) by mouth every 8 (eight) hours as needed for nausea or vomiting. 02/02/23   Pickenpack-Cousar, Carlena Sax, NP  pantoprazole (PROTONIX) 40 MG tablet Take 1 tablet (40 mg total) by mouth 2 (two) times daily. Patient taking differently: Take 40 mg by mouth 2 (two) times daily before a meal. 11/26/22   Pickenpack-Cousar, Carlena Sax, NP  polyethylene glycol (MIRALAX / GLYCOLAX) 17 g packet Take 17 g by mouth 2 (two) times daily. Patient taking differently: Take 17 g by mouth 2 (two) times daily as needed for mild constipation. 11/02/22   Antonieta Pert, MD  predniSONE (DELTASONE) 20 MG tablet Take 0.5 tablets (10 mg total) by mouth every morning. 02/05/23   Curt Bears, MD  PRESCRIPTION MEDICATION Apply 1 Application topically See admin instructions. Compounded pain cream ([place])- apply to the feet twice a day as needed for pain    [provider]  PRESCRIPTION MEDICATION CPAP- At bedtime    [provider]  prochlorperazine (COMPAZINE) 10 MG tablet Take 1 tablet (10 mg total) by mouth every 6 (six) hours as needed for nausea or vomiting. 01/01/23   Pickenpack-Cousar, Carlena Sax, NP  senna (SENOKOT) 8.6 MG TABS tablet Take 2 tablets (17.2 mg total) by mouth 2 (two) times daily. Patient not taking: Reported on 12/12/2022 11/27/22   Pickenpack-Cousar, Carlena Sax, NP  valsartan (DIOVAN) 320 MG tablet TAKE 1 TABLET BY MOUTH EVERY DAY Patient taking differently: Take 320 mg by mouth in the morning. 08/20/22   Park Liter, MD    Physical Exam: Vitals:   02/05/2023 1115 02/10/2023 1130 01/24/2023 1145 01/22/2023 1305  BP: (!) 97/58 (!) 121/55 (!) 109/44 139/76  Pulse: (!) 45 90 75 (!) 137  Resp:      Temp:    99 F (37.2 C)  TempSrc:    Oral  SpO2: 91% 100% 100%     Physical Exam Vitals and nursing note reviewed.  Constitutional:      General: He is in acute  distress.     Appearance: He is normal weight. He is ill-appearing. He is not diaphoretic.  HENT:     Head: Normocephalic and atraumatic.  Eyes:     Extraocular Movements: Extraocular movements intact.     Pupils: Pupils are equal, round, and reactive to light.  Neck:     Thyroid: Thyromegaly present.     Vascular: No hepatojugular reflux or JVD.     Trachea: No tracheal deviation.  Cardiovascular:     Rate and Rhythm: Normal rate and regular rhythm.     Pulses:          Carotid pulses are 2+ on the right side and 2+ on the left side.      Radial pulses are 2+ on the right side and 2+ on the left side.       Dorsalis pedis pulses are 2+ on the right side and 2+ on the left side.       Posterior tibial pulses are 2+ on the right side and 2+ on the left side.     Heart sounds:     No systolic murmur is present.     No diastolic murmur is present.     No gallop.  Pulmonary:     Effort: Pulmonary effort is normal. No tachypnea or accessory muscle usage.     Breath sounds: Examination of the right-lower field reveals decreased breath sounds. Decreased breath sounds present. No wheezing, rhonchi or rales.     Comments: SOB at rest Chest:     Chest wall: Tenderness present. No mass or deformity.     Comments: Mild tenderness to palpation right chest anterior axillary line Abdominal:     General: Bowel sounds are normal.     Palpations: Abdomen is soft. There is no hepatomegaly or mass.     Tenderness: There is no abdominal tenderness. There is no guarding.  Musculoskeletal:        General: Normal range of motion.     Cervical back: Normal range of motion and neck supple.     Right lower leg: No edema.     Left lower leg: No edema.  Skin:    General: Skin is warm and dry.     Coloration: Skin is not cyanotic.     Findings: No ecchymosis.  Neurological:     General: No focal deficit present.     Mental Status: He is alert and oriented to person, place, and time.  Psychiatric:         Mood and Affect: Mood is anxious.      Labs on Admission: I have personally reviewed following labs and imaging studies  CBC: Recent Labs  Lab 02/05/23 0834 02/05/23 1110 01/16/2023 1011  WBC 15.9* 20.4* 18.9*  NEUTROABS 13.8* 18.7* 16.5*  HGB 8.9* 9.9* 9.2*  HCT 28.4* 31.7* 29.7*  MCV 91.0 91.6 93.1  PLT 321 337 [id number]   Basic Metabolic Panel: Recent Labs  Lab 02/05/23 0834 02/05/23 1110  NA 133* 131*  K 3.4* 4.3  CL 94* 93*  CO2 28 27  GLUCOSE 100* 127*  BUN 16 15  CREATININE 0.79 0.84  CALCIUM 9.0 9.1  MG  --  1.8   GFR: Estimated Creatinine Clearance: 87.2 mL/min (by C-G formula based on SCr of 0.84 mg/dL). Liver Function Tests: Recent Labs  Lab 02/05/23 0834 02/05/23 1110  AST 221* 246*  ALT 47* 55*  ALKPHOS 89 91  BILITOT 0.4 0.6  PROT 6.5 7.0  ALBUMIN 3.6 3.5   No results for input(s): "LIPASE", "AMYLASE" in the last 168 hours. No results for input(s): "AMMONIA" in the last 168 hours. Coagulation Profile: Recent Labs  Lab 01/16/2023 1011  INR 1.1   Cardiac Enzymes: No results for input(s): "CKTOTAL", "CKMB", "CKMBINDEX", "TROPONINI" in the last 168  hours. BNP (last 3 results) No results for input(s): "PROBNP" in the last 8760 hours. HbA1C: No results for input(s): "HGBA1C" in the last 72 hours. CBG: Recent Labs  Lab 02/05/23 1120  GLUCAP 103*   Lipid Profile: No results for input(s): "CHOL", "HDL", "LDLCALC", "TRIG", "CHOLHDL", "LDLDIRECT" in the last 72 hours. Thyroid Function Tests: No results for input(s): "TSH", "T4TOTAL", "FREET4", "T3FREE", "THYROIDAB" in the last 72 hours. Anemia Panel: No results for input(s): "VITAMINB12", "FOLATE", "FERRITIN", "TIBC", "IRON", "RETICCTPCT" in the last 72 hours. Urine analysis:    Component Value Date/Time   COLORURINE YELLOW 12/12/2022 1400   APPEARANCEUR CLEAR 12/12/2022 1400   LABSPEC >1.046 (H) 12/12/2022 1400   PHURINE 5.0 12/12/2022 1400   GLUCOSEU NEGATIVE 12/12/2022 1400   HGBUR  NEGATIVE 12/12/2022 1400   BILIRUBINUR NEGATIVE 12/12/2022 1400   BILIRUBINUR neg 08/27/2015 1103   KETONESUR 20 (A) 12/12/2022 1400   PROTEINUR NEGATIVE 12/12/2022 1400   UROBILINOGEN 0.2 08/27/2015 1103   NITRITE NEGATIVE 12/12/2022 1400   LEUKOCYTESUR NEGATIVE 12/12/2022 1400    Radiological Exams on Admission: I have personally reviewed images DG CHEST PORT 1 VIEW  Result Date: 01/30/2023 CLINICAL DATA:  Chest pain, pleural effusion EXAM: PORTABLE CHEST 1 VIEW COMPARISON:  Previous studies including the examination done on 02/05/2023 FINDINGS: Cardiac size is within normal limits. There is interval worsening of infiltrate in right mid and right lower lung fields. Right hemidiaphragm is obscured. There is blunting of right lateral CP angle. Prominence of right paratracheal soft tissues has not changed. Left lung is clear. There is no pneumothorax. IMPRESSION: There is interval worsening of infiltrates in right lung suggesting worsening of atelectasis/pneumonia. Part of the increased density in the right lower lung field most likely is due to increasing right pleural effusion. Electronically Signed   By: Elmer Picker M.D.   On: 01/31/2023 12:51   ECHOCARDIOGRAM LIMITED  Result Date: 02/05/2023    ECHOCARDIOGRAM LIMITED REPORT   Patient Name:   DEQUARIUS PLACZEK Date of Exam: 02/05/2023 Medical Rec #:  [id number]      Height:       71.0 in Accession #:    [id number]     Weight:       174.2 lb Date of Birth:  1951/03/05      BSA:          1.988 m Patient Age:    30 years       BP:           116/64 mmHg Patient Gender: M              HR:           127 bpm. Exam Location:  Inpatient Procedure: Limited Echo and Limited Color Doppler STAT ECHO Indications:     Chest Pain  History:         Patient has prior history of Echocardiogram examinations, most                  recent 03/23/2020. Stroke, Signs/Symptoms:Dyspnea and Chest Pain;                  Risk Factors:Hypertension and Dyslipidemia. Cancer, hx  of                  radiation.  Sonographer:     Eartha Inch Referring Phys:  [id number] [place] J PATWARDHAN Diagnosing Phys: Vernell Leep MD IMPRESSIONS  1. Left ventricular ejection fraction, by estimation, is 55 to 60%. The  left ventricle has normal function. The left ventricle has no regional wall motion abnormalities. Left ventricular diastolic function could not be evaluated.  2. Right ventricular systolic function is normal. The right ventricular size is normal.  3. The mitral valve is grossly normal. Trivial mitral valve regurgitation. FINDINGS  Left Ventricle: Left ventricular ejection fraction, by estimation, is 55 to 60%. The left ventricle has normal function. The left ventricle has no regional wall motion abnormalities. The left ventricular internal cavity size was normal in size. There is  no left ventricular hypertrophy. Left ventricular diastolic function could not be evaluated. Right Ventricle: The right ventricular size is normal. No increase in right ventricular wall thickness. Right ventricular systolic function is normal. Pericardium: There is no evidence of pericardial effusion. Mitral Valve: The mitral valve is grossly normal. Trivial mitral valve regurgitation. Tricuspid Valve: The tricuspid valve is normal in structure. Tricuspid valve regurgitation is not demonstrated. No evidence of tricuspid stenosis. Aorta: The aortic root is normal in size and structure. Additional Comments: Color Doppler performed.  LEFT VENTRICLE PLAX 2D LVIDd:         4.30 cm LVIDs:         3.00 cm LV PW:         0.90 cm LV IVS:        1.00 cm LVOT diam:     2.20 cm LVOT Area:     3.80 cm  LEFT ATRIUM         Index LA diam:    3.20 cm 1.61 cm/m   AORTA Ao Root diam: 3.20 cm  SHUNTS Systemic Diam: 2.20 cm Vernell Leep MD Electronically signed by Vernell Leep MD Signature Date/Time: 02/05/2023/12:29:53 PM    Final    DG Chest Portable 1 View  Result Date: 02/05/2023 CLINICAL DATA:  Chest pain.  EXAM: PORTABLE CHEST 1 VIEW COMPARISON:  Chest CT 02/02/2023. FINDINGS: Small right pleural effusion with adjacent right basilar atelectasis. Unchanged right-sided mediastinal and hilar lymphadenopathy. The left lung is clear. Stable cardiac and mediastinal contours. No pneumothorax. IMPRESSION: 1. Small right pleural effusion with adjacent right basilar atelectasis. 2. Unchanged right-sided mediastinal and hilar lymphadenopathy. Electronically Signed   By: Emmit Alexanders M.D.   On: 02/05/2023 11:44    EKG: I have personally reviewed EKG: sinus trhythm, acute ST elevation inferior leads - acute inferior infarction  Assessment/Plan Principal Problem:   Chest pain due to coronary artery disease ([place]) Active Problems:   Intractable pain   Adenocarcinoma of right lung, stage 4 (HCC)   Lobar pneumonia (HCC)   Essential hypertension   Demyelinating neuropathy   Mild protein malnutrition (HCC)   Pleural effusion on right    Assessment and Plan: * Chest pain due to coronary artery disease ([place]) Patient in cancer clinic 02/05/23 had EKG revealing anterior acute MI although he did not report typical anginal chest pain. He was referred to ED and was seen by Cardiology, Dr. Virgina Jock. Patient not considered a candidate for invasive intervention and was advised to treat with DAPT-ASA/Plavix and to continue BB with metoprolol 25 mg BID. He was pain free and was discharged to home. Today, 01/31/2023 he had severe recurrent right chest pain and returned to [name]. 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 Dr. Virgina Jock had no new recommendations. He confirmed with the patient and wife to not do any invasive treatment. On PE no rub or clinical findings of pericarditis  Plan Admit to med-surg floor - with no intervention  planned telemetry is not productive and patient prefers to not be on telemetry Continue present regimen of DAPT, cardiac meds  Pain management, including  anti-inflammatory meds.   Lobar pneumonia ([place]) At presentation patient c/o right chest pain. He denies fever or cough. Leukocytosis present on admission labs to 20K with left shift. CXR 01/30/2023 reveals new RML., RLL infiltrates and increased effusion  Plan Oxygen at 2L for comfort  Abx - Rocephin and Azithromycin  Adenocarcinoma of right lung, stage 4 ([place]) Patient with Stage IV NSSC lung/adenocarcinoma. Followed by Dr. Julien Nordmann with last visit 02/05/23.Has failed chemotherapy with recent CT revealing progressive disease including lytic lesin 9th rib, progressive lymphadenopathy. All chemo stopped but was to start [place] but awaiting financial application for assistance. Has seen palliative care as outpatient for symptom management. He has been on duragesic patch recently increased to 75 mcq, low dose steroids, dilaudid 4mg  increased to 8 mg prn. He has had relatively good pain control until this admission.  Plan Pain management- see below  Palliative care consult  Establish with hospice for home care  Start Krazati once finanacial barriers are dealt with.  Intractable pain >>ASSESSMENT AND PLAN FOR PAIN MANAGEMENT WRITTEN ON 02/13/2023  1:11 PM BY Taras Rask E, MD  Patient with multi-factorial origin of pain including OA, lytic bone lesions (9th rib), cardiac related inflammation. His usual home regimen of duragesic patch, prn dilaudid, gabapentin has not been providing control and in the ED he was in considerable pain. Discussed the multiple factors with the patient and his wife along with appropriate goal setting in regard to control vs relief. With progressive malignancy, right sided chest pain concern for pulmonary infarct related to possible PE.  Plan D-Dimer - if positive CTA Continue duragesic patch 75 mcg q 72 hrs  PCA dilaudid pump  Steroids for inflammatory pain - d/c prednison, solumedrol 125 mg IV followed after 8 hrs by solumedrol 40 mg bid.  Continue scheduled  APAP  Pleural effusion on right Patient with right sided pleural effusion 02/04/21. Patient with increased SOB today and CXR with increased pleural effusion  Plan Oxygen support  Treatment for PNA as above  F/u CXR 02/07/23 - consider thoracentesis for continued increase in effusion.   Mild protein malnutrition (HCC) Poor PO intake lately and he has been losing some weight.  Plan RD consult  Demyelinating neuropathy Long standing problem dating back to when he had a stroke.  Plan Continue gabapentin  Essential hypertension Patient has had long-standing hypertension. At admission was well controlled, despite pain  Plan Continue home regimen       DVT prophylaxis: Lovenox Code Status: DNR/DNI(Do NOT Intubate) Family Communication: have spoken with wife who was present during interview and exam. She has been updated with new findings. She agrees with Tx plan and code status. She very much wants to take him home with hospice  Disposition Plan: home with hospice when stable for discharge  Consults called: Cardiology - Dr. Ann Maki; Oncology - Dr. Earlie Server  Admission status: Inpatient, Med-Surg   Adella Hare, MD [facility] 01/17/2023, 1:36 PM

## 2023-02-06 NOTE — Discharge Instructions (Signed)

## 2023-02-06 NOTE — ED Notes (Signed)
Pt felt short of breath and wanted RN to place oxygen. He was place on 4Liters of oxygen.

## 2023-02-06 NOTE — Assessment & Plan Note (Addendum)
>>  ASSESSMENT AND PLAN FOR PAIN MANAGEMENT WRITTEN ON 01/18/2023  1:11 PM BY Marcas Bowsher E, MD  Patient with multi-factorial origin of pain including OA, lytic bone lesions (9th rib), cardiac related inflammation. His usual home regimen of duragesic patch, prn dilaudid, gabapentin has not been providing control and in the ED he was in considerable pain. Discussed the multiple factors with the patient and his wife along with appropriate goal setting in regard to control vs relief. With progressive malignancy, right sided chest pain concern for pulmonary infarct related to possible PE.  Plan D-Dimer - if positive CTA Continue duragesic patch 75 mcg q 72 hrs  PCA dilaudid pump  Steroids for inflammatory pain - d/c prednison, solumedrol 125 mg IV followed after 8 hrs by solumedrol 40 mg bid.  Continue scheduled APAP

## 2023-02-06 NOTE — ED Notes (Signed)
ED TO INPATIENT HANDOFF REPORT  ED Nurse Name and Phone #: [phone]  S Name/Age/Gender Thomas [name] 72 y.o. male Room/Bed: 021C/021C  Code Status   Code Status: DNR  Home/SNF/Other Home Patient oriented to: self, place, time, and situation Is this baseline? Yes   Triage Complete: Triage complete  Chief Complaint Chest pain due to coronary artery disease [facility]) [I25.119]  Triage Note Pt BIBGEMS from home as an activated STEMI, CP started Wednesday. Bilateral CP today. Went upstairs to cath lab and sent back down to ER for evaluation.  324 ASA 0.4 mg sublingual Nitro  18 LAC  Hx stg 4 lung cancer  106/50 79/34   Allergies Allergies  Allergen Reactions   Statins Other (See Comments)    Jaw tightness and severe muscle pain- Pravastatin     Level of Care/Admitting Diagnosis ED Disposition     ED Disposition  Admit   Condition  --   Comment  Hospital Area: [place] [[id number]]  Level of Care: Med-Surg [16]  May admit patient to [facility] or [facility] if equivalent level of care is available:: No  Covid Evaluation: Asymptomatic - no recent exposure (last 10 days) testing not required  Diagnosis: Chest pain due to coronary artery disease [facility]) [id number]  Admitting Physician: Neena Rhymes [[id number]]  Attending Physician: Neena Rhymes [id number]  Certification:: I certify this patient will need inpatient services for at least 2 midnights  Estimated Length of Stay: 4          B Medical/Surgery History Past Medical History:  Diagnosis Date   Abdominal wall pain 04/02/2021   Acute ischemic stroke ([employer]) 03/22/2020   Avulsed toenail, initial encounter 05/04/2018   Dyspnea on exertion 02/10/2020   ED (erectile dysfunction)    Epidermoid cyst of neck 10/16/2017   Essential hypertension 07/21/2017   GERD (gastroesophageal reflux disease)    Gout of multiple sites 07/21/2017   History of kidney stones    History of radiation therapy     Right Chest, Right Pelvis- 10/30/22-11/13/22- Sr. 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   Urgency of urination    URI (upper respiratory infection) 11/08/2014   Wears glasses    Past Surgical History:  Procedure Laterality Date   COLONOSCOPY WITH PROPOFOL  2017   CYSTOSCOPY/RETROGRADE/URETEROSCOPY/STONE EXTRACTION WITH BASKET Left 04/16/2018   Procedure: CYSTOSCOPY/RETROGRADE/URETEROSCOPY/STONE EXTRACTION WITH BASKET/ HOLMIUM LASER LITHOTRIPSY/ STENT PLACEMENT;  Surgeon: Ardis Hughs, MD;  Location: [facility];  Service: Urology;  Laterality: Left;   HERNIA REPAIR Right 2022   inguinal   HOLMIUM LASER APPLICATION Left [id number]   Procedure: HOLMIUM LASER APPLICATION;  Surgeon: Ardis Hughs, MD;  Location: [facility];  Service: Urology;  Laterality: Left;   TOTAL HIP ARTHROPLASTY Right 06/30/2022   Procedure: RIGHT TOTAL HIP ARTHROPLASTY ANTERIOR APPROACH;  Surgeon: Leandrew Koyanagi, MD;  Location: [place];  Service: Orthopedics;  Laterality: Right;  3-C     A IV Location/Drains/Wounds Patient Lines/Drains/Airways Status     Active  Line/Drains/Airways     Name Placement date Placement time Site Days   Peripheral IV 01/27/2023 20 G Anterior;Distal;Left;Upper Arm 01/28/2023  1102  Arm  less than 1   Ureteral Drain/Stent Left ureter 6 Fr. 04/16/18  1345  Left ureter  1757            Intake/Output Last 24 hours No intake or output data in the 24 hours ending 02/01/2023 1130  Labs/Imaging Results for orders  placed or performed during the hospital encounter of 02/05/23 (from the past 48 hour(s))  Troponin I (High Sensitivity)     Status: Abnormal   Collection Time: 02/05/23 11:10 AM  Result Value Ref Range   Troponin I (High Sensitivity) >24,000 (HH) <18 ng/L    Comment: CRITICAL RESULT CALLED TO, READ BACK BY AND VERIFIED WITH BRUNSON,T. RN AT [id number] 02/05/23 MULLINS,T (NOTE) Elevated high sensitivity troponin I (hsTnI) values and significant  changes across serial measurements may suggest ACS but many other  chronic and acute conditions are known to elevate hsTnI results.  Refer to the "Links" section for chest pain algorithms and additional  guidance. Performed at [facility], [place] [address] [place], [place] [address]   Comprehensive metabolic panel     Status: Abnormal   Collection Time: 02/05/23 11:10 AM  Result Value Ref Range   Sodium 131 (L) 135 - 145 mmol/L   Potassium 4.3 3.5 - 5.1 mmol/L   Chloride 93 (L) 98 - 111 mmol/L   CO2 27 22 - 32 mmol/L   Glucose, Bld 127 (H) 70 - 99 mg/dL    Comment: Glucose reference range applies only to samples taken after fasting for at least 8 hours.   BUN 15 8 - 23 mg/dL   Creatinine, Ser 0.84 0.61 - 1.24 mg/dL   Calcium 9.1 8.9 - 10.3 mg/dL   Total Protein 7.0 6.5 - 8.1 g/dL   Albumin 3.5 3.5 - 5.0 g/dL   AST 246 (H) 15 - 41 U/L   ALT 55 (H) 0 - 44 U/L   Alkaline Phosphatase 91 38 - 126 U/L   Total Bilirubin 0.6 0.3 - 1.2 mg/dL   GFR, Estimated >60 >60 mL/min    Comment: (NOTE) Calculated using the CKD-EPI Creatinine Equation (2021)    Anion gap 11 5 - 15    Comment: Performed at [facility], [place] [address], [place], [place] [address]  Magnesium     Status: None   Collection Time: 02/05/23 11:10 AM  Result Value Ref Range   Magnesium 1.8 1.7 - 2.4 mg/dL    Comment: Performed at [facility], [place] [address] [place], [place] [address]  CBC with Differential/Platelet      Status: Abnormal   Collection Time: 02/05/23 11:10 AM  Result Value Ref Range   WBC 20.4 (H) 4.0 - 10.5 K/uL   RBC 3.46 (L) 4.22 - 5.81 MIL/uL   Hemoglobin 9.9 (L) 13.0 - 17.0 g/dL   HCT 31.7 (L) 39.0 - 52.0 %   MCV 91.6 80.0 - 100.0 fL   MCH 28.6 26.0 - 34.0 pg   MCHC 31.2 30.0 - 36.0 g/dL   RDW 20.3 (H) 11.5 - 15.5 %   Platelets 337 150 - 400 K/uL   nRBC 0.0 0.0 - 0.2 %   Neutrophils Relative % 91 %   Neutro Abs 18.7 (H) 1.7 - 7.7 K/uL   Lymphocytes Relative 2 %   Lymphs Abs 0.4 (L) 0.7 - 4.0 K/uL  Monocytes Relative 6 %   Monocytes Absolute 1.1 (H) 0.1 - 1.0 K/uL   Eosinophils Relative 0 %   Eosinophils Absolute 0.0 0.0 - 0.5 K/uL   Basophils Relative 0 %   Basophils Absolute 0.0 0.0 - 0.1 K/uL   Immature Granulocytes 1 %   Abs Immature Granulocytes 0.13 (H) 0.00 - 0.07 K/uL    Comment: Performed at [facility], [place] [address] [place],  [address]  CBG monitoring, ED     Status: Abnormal   Collection Time: 02/05/23 11:20 AM  Result Value Ref Range   Glucose-Capillary 103 (H) 70 - 99 mg/dL    Comment: Glucose reference range applies only to samples taken after fasting for at least 8 hours.   ECHOCARDIOGRAM LIMITED  Result Date: 02/05/2023    ECHOCARDIOGRAM LIMITED REPORT   Patient Name:   Thomas [name] Date of Exam: 02/05/2023 Medical Rec #:  [id number]      Height:       71.0 in Accession #:    [id number]     Weight:       174.2 lb Date of Birth:  02-27-1951      BSA:          1.988 m Patient Age:    8 years       BP:           116/64 mmHg Patient Gender: M              HR:           127 bpm. Exam Location:  Inpatient Procedure: Limited Echo and Limited Color Doppler STAT ECHO Indications:     Chest Pain  History:         Patient has prior history of Echocardiogram examinations, most                  recent 03/23/2020. Stroke, Signs/Symptoms:Dyspnea and Chest Pain;                  Risk Factors:Hypertension and Dyslipidemia. Cancer, hx of                   radiation.  Sonographer:     Eartha Inch Referring Phys:  [id number] [place] J PATWARDHAN Diagnosing Phys: Vernell Leep MD IMPRESSIONS  1. Left ventricular ejection fraction, by estimation, is 55 to 60%. The left ventricle has normal function. The left ventricle has no regional wall motion abnormalities. Left ventricular diastolic function could not be evaluated.  2. Right ventricular systolic function is normal. The right ventricular size is normal.  3. The mitral valve is grossly normal. Trivial mitral valve regurgitation. FINDINGS  Left Ventricle: Left ventricular ejection fraction, by estimation, is 55 to 60%. The left ventricle has normal function. The left ventricle has no regional wall motion abnormalities. The left ventricular internal cavity size was normal in size. There is  no left ventricular hypertrophy. Left ventricular diastolic function could not be evaluated. Right Ventricle: The right ventricular size is normal. No increase in right ventricular wall thickness. Right ventricular systolic function is normal. Pericardium: There is no evidence of pericardial effusion. Mitral Valve: The mitral valve is grossly normal. Trivial mitral valve regurgitation. Tricuspid Valve: The tricuspid valve is normal in structure. Tricuspid valve regurgitation is not demonstrated. No evidence of tricuspid stenosis. Aorta: The aortic root is normal in size and structure. Additional Comments: Color Doppler performed.  LEFT VENTRICLE PLAX 2D LVIDd:  4.30 cm LVIDs:         3.00 cm LV PW:         0.90 cm LV IVS:        1.00 cm LVOT diam:     2.20 cm LVOT Area:     3.80 cm  LEFT ATRIUM         Index LA diam:    3.20 cm 1.61 cm/m   AORTA Ao Root diam: 3.20 cm  SHUNTS Systemic Diam: 2.20 cm Vernell Leep MD Electronically signed by Vernell Leep MD Signature Date/Time: 02/05/2023/12:29:53 PM    Final    DG Chest Portable 1 View  Result Date: 02/05/2023 CLINICAL DATA:  Chest pain. EXAM: PORTABLE CHEST 1  VIEW COMPARISON:  Chest CT 02/02/2023. FINDINGS: Small right pleural effusion with adjacent right basilar atelectasis. Unchanged right-sided mediastinal and hilar lymphadenopathy. The left lung is clear. Stable cardiac and mediastinal contours. No pneumothorax. IMPRESSION: 1. Small right pleural effusion with adjacent right basilar atelectasis. 2. Unchanged right-sided mediastinal and hilar lymphadenopathy. Electronically Signed   By: Emmit Alexanders M.D.   On: 02/05/2023 11:44    Pending Labs Unresulted Labs (From admission, onward)     Start     Ordered   02/13/23 0500  Creatinine, serum  (enoxaparin (LOVENOX)    CrCl >/= 30 ml/min)  Weekly,   R     Comments: while on enoxaparin therapy    02/02/2023 1120   01/23/2023 1012  Protime-INR  Once,   STAT        01/16/2023 1012   02/13/2023 1012  Brain natriuretic peptide  Once,   URGENT        02/11/2023 1012   01/22/2023 1011  Comprehensive metabolic panel  Once,   STAT        01/28/2023 1012   02/12/2023 1011  CBC with Differential  Once,   STAT        02/11/2023 1012            Vitals/Pain Today's Vitals   01/16/2023 0953 02/12/2023 0955 01/29/2023 1045  BP:  109/73 (!) 91/47  Pulse:  88 84  Resp:  16 19  Temp:  98.2 F (36.8 C)   TempSrc:  Oral   SpO2:  96% 96%  PainSc: 9       Isolation Precautions No active isolations  Medications Medications  enoxaparin (LOVENOX) injection 40 mg (has no administration in time range)  0.9 %  sodium chloride infusion (has no administration in time range)  traZODone (DESYREL) tablet 25 mg (has no administration in time range)  senna (SENOKOT) tablet 8.6 mg (has no administration in time range)  naloxone (NARCAN) injection 0.4 mg (has no administration in time range)    And  sodium chloride flush (NS) 0.9 % injection 9 mL (has no administration in time range)  ondansetron (ZOFRAN) injection 4 mg (has no administration in time range)  diphenhydrAMINE (BENADRYL) injection 12.5 mg (has no administration in  time range)    Or  diphenhydrAMINE (BENADRYL) 12.5 MG/5ML elixir 12.5 mg (has no administration in time range)  HYDROmorphone (DILAUDID) 1 mg/mL PCA injection (has no administration in time range)  methylPREDNISolone sodium succinate (SOLU-MEDROL) 125 mg/2 mL injection 125 mg (has no administration in time range)  methylPREDNISolone sodium succinate (SOLU-MEDROL) 40 mg/mL injection 40 mg (has no administration in time range)  acetaminophen (TYLENOL) CR tablet 1,300 mg (has no administration in time range)  allopurinol (ZYLOPRIM) tablet 300 mg (has no administration in  time range)  adagrasib (KRAZATI) tablet 600 mg (has no administration in time range)  amLODipine (NORVASC) tablet 5 mg (has no administration in time range)  metoprolol succinate (TOPROL-XL) 24 hr tablet 25 mg (has no administration in time range)  minoxidil (LONITEN) tablet 10 mg (has no administration in time range)  prochlorperazine (COMPAZINE) tablet 10 mg (has no administration in time range)  ondansetron (ZOFRAN-ODT) disintegrating tablet 8 mg (has no administration in time range)  pantoprazole (PROTONIX) EC tablet 40 mg (has no administration in time range)  clopidogrel (PLAVIX) tablet 75 mg (has no administration in time range)  Integra Plus CAPS 1 capsule (has no administration in time range)  folic acid (FOLVITE) tablet 1 mg (has no administration in time range)  gabapentin (NEURONTIN) capsule 300 mg (has no administration in time range)  loratadine (CLARITIN) tablet 10 mg (has no administration in time range)  irbesartan (AVAPRO) tablet 300 mg (has no administration in time range)  aspirin tablet 325 mg (has no administration in time range)  fentaNYL (DURAGESIC) 75 MCG/HR 1 patch (has no administration in time range)  HYDROmorphone (DILAUDID) injection 2 mg (2 mg Intravenous Given 02/13/2023 1051)    Mobility walks     Focused Assessments Cardiac Assessment Handoff:  Cardiac Rhythm: Normal sinus rhythm No  results found for: "CKTOTAL", "CKMB", "CKMBINDEX", "TROPONINI" No results found for: "DDIMER" Does the Patient currently have chest pain? Yes    R Recommendations: See Admitting Provider Note  Report given to:   Additional Notes:

## 2023-02-06 NOTE — Assessment & Plan Note (Signed)
Patient with right sided pleural effusion 02/04/21. Patient with increased SOB today and CXR with increased pleural effusion  Plan Oxygen support  Treatment for PNA as above  F/u CXR 02/07/23 - consider thoracentesis for continued increase in effusion.

## 2023-02-06 NOTE — Progress Notes (Signed)
Initial Nutrition Assessment  DOCUMENTATION CODES:   Non-severe (moderate) malnutrition in context of chronic illness  INTERVENTION:   Ensure Enlive po TID, each supplement provides 350 kcal and 20 grams of protein. Magic cup BID with meals, each supplement provides 290 kcal and 9 grams of protein Encourage good PO intake  Family allowed to bring pt in food or snacks as pleased High Calorie, High Protein Nutrition therapy added to AVS Recommend follow-up with RD at [facility] Consider starting appetite stimulant   NUTRITION DIAGNOSIS:   Moderate Malnutrition related to chronic illness as evidenced by percent weight loss, moderate fat depletion, moderate muscle depletion.  GOAL:   Patient will meet greater than or equal to 90% of their needs  MONITOR:   PO intake, Supplement acceptance, Weight trends, Labs  REASON FOR ASSESSMENT:   Consult Poor PO  ASSESSMENT:   72 y.o. male presented to the ED with R sided chest pain. Pt presented to hte ED from the cancer center 3/21, revealing an anterior acute MI and was discharged home. PMH includes HTN, HLD, gout, and stage IV NSCLC with mets. Pt admitted with chest pain and possible post-MI inflammation pericarditis and PNA.   Met with pt and family at bedside. Pt reports that he has had a very poor appetite and eating very little. Shares that he was drinking 1 CorePower protein shake per day. Family explained that he would eat about half of a normal person portion at meal at meal time. Shares that he has been dealing with constipation, no reports of nausea or vomiting. Family hopefull for pts appetite to improve since finishing chemo and starting a new treatment. Pt agreeable to Ensure. RD recommend asking oncology team for an appetite stimulant if desired.  Pt endorses weight loss, reports UBW in the 200# range and now down to the 170# range. Per EMR, pt has had a 24.5% weight loss within 7 months, this is clinically significant for  timeframe. Based on pt weight loss and physical exam, pt meets criteria for malnutrition.  Wt Readings from Last 20 Encounters:  02/05/23 79 kg  02/05/23 78.7 kg  01/19/23 80.3 kg  01/14/23 82.7 kg  12/25/22 87.2 kg  12/12/22 90.5 kg  12/10/22 90.5 kg  12/10/22 90.4 kg  12/03/22 90.5 kg  11/26/22 90.8 kg  11/12/22 90.4 kg  11/07/22 90.7 kg  11/03/22 89.1 kg  11/02/22 85.5 kg  09/24/22 92.5 kg  09/10/22 95.3 kg  08/21/22 96.6 kg  06/30/22 104.3 kg  06/19/22 103.2 kg  06/06/22 103.2 kg   RD to add handout to AVS. Also encouraged pt follow-up with dietitian at the cancer center at next visit.   Medications reviewed and include: Zithromax, Trigels-F Forte, Folic acid, Solu-medrol, Protonix, Miralax, Senna, IV antibiotics  Labs reviewed: Sodium 132, Potassium 4.9,    NUTRITION - FOCUSED PHYSICAL EXAM:  Flowsheet Row Most Recent Value  Orbital Region Moderate depletion  Upper Arm Region No depletion  Thoracic and Lumbar Region Mild depletion  Buccal Region Moderate depletion  Temple Region Moderate depletion  Clavicle Bone Region Moderate depletion  Clavicle and Acromion Bone Region Mild depletion  Scapular Bone Region Mild depletion  Dorsal Hand No depletion  Patellar Region Moderate depletion  Anterior Thigh Region Moderate depletion  Posterior Calf Region Moderate depletion  Edema (RD Assessment) None  Hair Reviewed  Eyes Reviewed  Mouth Reviewed  Skin Reviewed  Nails Reviewed   Diet Order:   Diet Order  Diet regular Room service appropriate? Yes; Fluid consistency: Thin  Diet effective now                  EDUCATION NEEDS:   Education needs have been addressed  Skin:  Skin Assessment: Reviewed RN Assessment  Last BM:  Unknown  Height:  Ht Readings from Last 1 Encounters:  02/05/23 5\' 11"  (1.803 m)   Weight:  Wt Readings from Last 1 Encounters:  02/05/23 79 kg   Ideal Body Weight:  78.2 kg  BMI:  There is no height or weight on  file to calculate BMI.  Estimated Nutritional Needs:  Kcal:  2200-2400 Protein:  110-130 grams Fluid:  >/= 2 L   Hermina Barters RD, LDN Clinical Dietitian See [facility] for contact information.

## 2023-02-06 NOTE — Assessment & Plan Note (Signed)
Patient has had long-standing hypertension. At admission was well controlled, despite pain  Plan Continue home regimen

## 2023-02-06 NOTE — Assessment & Plan Note (Signed)
At presentation patient c/o right chest pain. He denies fever or cough. Leukocytosis present on admission labs to 20K with left shift. CXR 02/05/2023 reveals new RML., RLL infiltrates and increased effusion  Plan Oxygen at 2L for comfort  Abx - Rocephin and Azithromycin

## 2023-02-06 NOTE — Assessment & Plan Note (Signed)
Patient with multi-factorial origin of pain including OA, lytic bone lesions (9th rib), cardiac related inflammation. His usual home regimen of duragesic patch, prn dilaudid, gabapentin has not been providing control and in the ED he was in considerable pain. Discussed the multiple factors with the patient and his wife along with appropriate goal setting in regard to control vs relief.   Plan Continue duragesic patch 75 mcg q 72 hrs  PCA dilaudid pump  Steroids for inflammatory pain - d/c prednison, solumedrol 125 mg IV followed after 8 hrs by solumedrol 40 mg bid.  Continue scheduled APAP

## 2023-02-06 NOTE — ED Triage Notes (Signed)
Pt BIBGEMS from home as an activated STEMI, CP started Wednesday. Bilateral CP today. Went upstairs to cath lab and sent back down to ER for evaluation.  324 ASA 0.4 mg sublingual Nitro  18 LAC  Hx stg 4 lung cancer  106/50 79/34

## 2023-02-06 NOTE — Plan of Care (Signed)

## 2023-02-07 ENCOUNTER — Inpatient Hospital Stay: Payer: Medicare Other

## 2023-02-07 ENCOUNTER — Inpatient Hospital Stay (HOSPITAL_COMMUNITY): Payer: Medicare Other

## 2023-02-07 DIAGNOSIS — C799 Secondary malignant neoplasm of unspecified site: Secondary | ICD-10-CM | POA: Diagnosis not present

## 2023-02-07 DIAGNOSIS — I25119 Atherosclerotic heart disease of native coronary artery with unspecified angina pectoris: Secondary | ICD-10-CM | POA: Diagnosis not present

## 2023-02-07 DIAGNOSIS — E440 Moderate protein-calorie malnutrition: Secondary | ICD-10-CM

## 2023-02-07 DIAGNOSIS — R52 Pain, unspecified: Secondary | ICD-10-CM | POA: Diagnosis not present

## 2023-02-07 DIAGNOSIS — I2119 ST elevation (STEMI) myocardial infarction involving other coronary artery of inferior wall: Secondary | ICD-10-CM | POA: Diagnosis not present

## 2023-02-07 MED ORDER — METHYLPREDNISOLONE SODIUM SUCC 40 MG IJ SOLR
40.0000 mg | Freq: Two times a day (BID) | INTRAMUSCULAR | Status: DC
  Filled 2023-02-07: qty 1

## 2023-02-07 MED ORDER — HYDROMORPHONE BOLUS VIA INFUSION: 0.5000 mg | INTRAVENOUS | Status: DC | PRN

## 2023-02-07 MED ORDER — LORAZEPAM 2 MG/ML PO CONC
1.0000 mg | ORAL | Status: DC | PRN
  Administered 2023-02-08: 1 mg via SUBLINGUAL
  Filled 2023-02-07 (×2): qty 1

## 2023-02-07 MED ORDER — GLYCOPYRROLATE 0.2 MG/ML IJ SOLN: 0.2000 mg | INTRAMUSCULAR | Status: DC | PRN

## 2023-02-07 MED ORDER — LORAZEPAM 2 MG/ML PO CONC: 1.0000 mg | ORAL | Status: DC | PRN

## 2023-02-07 MED ORDER — ACETAMINOPHEN 325 MG PO TABS: 650.0000 mg | ORAL_TABLET | Freq: Four times a day (QID) | ORAL | Status: DC | PRN

## 2023-02-07 MED ORDER — DIPHENHYDRAMINE HCL 50 MG/ML IJ SOLN: 12.5000 mg | INTRAMUSCULAR | Status: DC | PRN

## 2023-02-07 MED ORDER — LORAZEPAM 1 MG PO TABS: 1.0000 mg | ORAL_TABLET | ORAL | Status: DC | PRN

## 2023-02-07 MED ORDER — HALOPERIDOL 0.5 MG PO TABS: 0.5000 mg | ORAL_TABLET | ORAL | Status: DC | PRN

## 2023-02-07 MED ORDER — HYDROMORPHONE BOLUS VIA INFUSION
0.5000 mg | INTRAVENOUS | Status: DC | PRN
  Administered 2023-02-07: 0.5 mg via INTRAVENOUS
  Administered 2023-02-08: 1 mg via INTRAVENOUS

## 2023-02-07 MED ORDER — HALOPERIDOL LACTATE 2 MG/ML PO CONC: 0.5000 mg | ORAL | Status: DC | PRN

## 2023-02-07 MED ORDER — SODIUM CHLORIDE 0.9 % IV BOLUS: 500.0000 mL | Freq: Once | INTRAVENOUS | Status: DC

## 2023-02-07 MED ORDER — HALOPERIDOL LACTATE 2 MG/ML PO CONC: 2.0000 mg | Freq: Four times a day (QID) | ORAL | Status: DC | PRN

## 2023-02-07 MED ORDER — HALOPERIDOL 1 MG PO TABS: 2.0000 mg | ORAL_TABLET | Freq: Four times a day (QID) | ORAL | Status: DC | PRN

## 2023-02-07 MED ORDER — GLYCOPYRROLATE 1 MG PO TABS: 1.0000 mg | ORAL_TABLET | ORAL | Status: DC | PRN

## 2023-02-07 MED ORDER — ALBUMIN HUMAN 5 % IV SOLN
25.0000 g | Freq: Once | INTRAVENOUS | Status: AC
  Administered 2023-02-07: 25 g via INTRAVENOUS
  Filled 2023-02-07 (×2): qty 500

## 2023-02-07 MED ORDER — BIOTENE DRY MOUTH MT LIQD: 15.0000 mL | OROMUCOSAL | Status: DC | PRN

## 2023-02-07 MED ORDER — ORAL CARE MOUTH RINSE: 15.0000 mL | OROMUCOSAL | Status: DC | PRN

## 2023-02-07 MED ORDER — HALOPERIDOL LACTATE 5 MG/ML IJ SOLN
2.0000 mg | Freq: Four times a day (QID) | INTRAMUSCULAR | Status: DC | PRN
  Administered 2023-02-08: 2 mg via INTRAVENOUS
  Filled 2023-02-07: qty 1

## 2023-02-07 MED ORDER — SODIUM CHLORIDE 0.9 % IV BOLUS
250.0000 mL | Freq: Once | INTRAVENOUS | Status: AC
  Administered 2023-02-07: 250 mL via INTRAVENOUS

## 2023-02-07 MED ORDER — ACETAMINOPHEN 650 MG RE SUPP: 650.0000 mg | Freq: Four times a day (QID) | RECTAL | Status: DC | PRN

## 2023-02-07 MED ORDER — HYDROMORPHONE HCL-NACL 50-0.9 MG/50ML-% IV SOLN
0.5000 mg/h | INTRAVENOUS | Status: DC
  Administered 2023-02-07: 0.5 mg/h via INTRAVENOUS
  Filled 2023-02-07 (×2): qty 50

## 2023-02-07 MED ORDER — POLYVINYL ALCOHOL 1.4 % OP SOLN: 1.0000 [drp] | Freq: Four times a day (QID) | OPHTHALMIC | Status: DC | PRN

## 2023-02-07 MED ORDER — ORAL CARE MOUTH RINSE
15.0000 mL | OROMUCOSAL | Status: DC
  Administered 2023-02-08 (×2): 15 mL via OROMUCOSAL

## 2023-02-07 MED ORDER — HALOPERIDOL LACTATE 5 MG/ML IJ SOLN: 0.5000 mg | INTRAMUSCULAR | Status: DC | PRN

## 2023-02-07 MED ORDER — LORAZEPAM 2 MG/ML IJ SOLN: 1.0000 mg | INTRAMUSCULAR | Status: DC | PRN

## 2023-02-07 NOTE — Progress Notes (Signed)
Patient on PCA Dilaudid Patient had a low pulse between 39-43 Charge nurse  notified. On-call provider Abigail Butts NP, and rapid response informed-new order for EKG. Rapid response assessed patient ,EKG done. On -call provider asked to  contact family if possible  as nurse made  an attempt to call, but  no response. The family came and at bedside, Wife spoke with  [place] Patient sleeping comfortable,and monitoring in progress.

## 2023-02-07 NOTE — Progress Notes (Signed)
      CROSS COVER NOTE   Spoke with patient's wife, Thomas [name], over the phone regarding patient's goals of care.  Wife states that her priority as well as the patient's is pain control, with hopes of going home on hospice.  Wife confirms that patient did not wish to have extensive workup for other acute issues, nor did he want aggressive measures.    Tonight, family was informed regarding hypotensive episodes and current treatment, i.e. fluid and IV albumin.  Wife would like to continue current treatment and monitoring.  Currently declined further measures for BP treatment including pressors, invasive lines. Priority for family is stabilizing pt so he can go home with hospice care.    Nursing staff reports pt is responding to family and does not appear to be in acute distress.   Thomas Rover, DNP, [place]

## 2023-02-07 NOTE — Significant Event (Signed)
Rapid Response Evaluation - Not rapid response event  Reason for Call : Bradycardia (known) Initial Focused Assessment:  Notified by the nursing staff of pt with bradycardic rhythm in 39-41 range. EKG shows junctional rhythm with STE in inferior leads which is consistent with earlier findings yesterday. Pt is not a candidate for intervention and focus is comfort care with pain control. Pt is in and out of consciousness. He did endorse CP but could not describe his pain. Pt is not in distress. He is resting comfortably when sleeping. Skin is pale, cool and diaphoretic. Pt is on dilaudid PCA.  Unclear if it would be appropriate to transfer pt to PCU for care since care being provided is appropriate for goals of care. Nursing staff is contacting the patient's wife to inform her of the current situation.      Interventions:  -EKG         MD Notified: Per primary RN Call Time: 0239 Arrival Time: [id number] End Time: [place]  Madelynn Done, RN

## 2023-02-07 NOTE — Progress Notes (Signed)
   02/15/2023 2044  Provider Notification  Provider Name/Title A Chavez,NP  Date Provider Notified 02/05/2023  Time Provider Notified 2044  Method of Notification Page (secure chat)  Notification Reason Critical Result  Test performed and critical result EKG- (critical test result STEMI-Sinus rhythm with First degree AV block)  Date Critical Result Received 02/05/2023  Time Critical Result Received 2044  Provider response See new orders  Date of Provider Response 02/05/2023  Time of Provider Response 2055

## 2023-02-07 NOTE — Progress Notes (Signed)
Anticipate in-hospital death

## 2023-02-07 NOTE — Progress Notes (Signed)
Daily Progress Note   [name]       Date: 02/07/2023 DOB: 1951/04/18  Age: 72 y.o. MRN#: [id number] Attending Physician: Karmen Bongo, MD Primary Care Physician: Thomas Boston, MD Admit Date: 02/07/2023  Reason for Consultation/Follow-up: Establishing goals of care  Subjective: I have reviewed medical records including EPIC notes, MAR, and labs. Received report from primary RN -no acute concerns.  RN reports patient has been oriented only to self, experienced hypotension and bradycardia last night, vomited this morning, has been mainly asleep.  Went to visit patient at bedside -several family members present.  Met with wife outside patient's room -emotional support provided.  Reviewed overnight events -Thomas [name] is understandably tearful.  She has a clear understanding of patient's current acute medical situation.   We talked about transition to comfort measures in house and what that would entail inclusive of medications to control pain, dyspnea, agitation, nausea, and itching. We discussed stopping all unnecessary measures such as blood draws, needle sticks, oxygen, antibiotics, CBGs/insulin, cardiac monitoring, IVF, and frequent vital signs. Education provided that other non-pharmacological interventions would be utilized for holistic support and comfort such as spiritual support if requested, repositioning, music therapy, offering comfort feeds, and/or therapeutic listening.  Thomas [name] is clear that the goal is to focus on patient's comfort and quality of life, she does not want him in pain or to be suffering.  We discussed options of patient remaining in-house for end-of-life care versus discharge home with hospice versus residential hospice -recommendation given for patient to  remain in-house for end-of-life care as there would be high concern patient's symptoms would not be managed at home and he is likely not stable for transfer at this time.  She is agreeable for patient to remain in-house under comfort measures, and even prefers this.  Therapeutic listening and emotional support provided to [facility] as she expresses joy that patient has had some wakeful moments this afternoon with family present.  [place] end-of-life visitation policy reviewed.  Went back to patient's room for physical assessment -patient was awake, alert, able to participate in simple conversation. No signs or non-verbal gestures of pain or discomfort noted. No respiratory distress, increased work of breathing, or secretions noted.  He denies pain.  He he was on the phone with family member saying goodbyes as I  enter the room.  Patient falls asleep during my visit.  He is on 4 L O2 nasal cannula.   Length of Stay: 1  Current Medications: Scheduled Meds:   acetaminophen  1,000 mg Oral Q8H   clopidogrel  75 mg Oral Daily   Fe Fum-Vit C-Vit B12-FA  1 capsule Oral QPC breakfast   fentaNYL  1 patch Transdermal Q72H    Continuous Infusions:  sodium chloride 50 mL/hr at 02/07/23 0158   HYDROmorphone      PRN Meds: acetaminophen **OR** acetaminophen, antiseptic oral rinse, diphenhydrAMINE, glycopyrrolate **OR** glycopyrrolate **OR** glycopyrrolate, haloperidol **OR** haloperidol **OR** haloperidol lactate, HYDROmorphone, LORazepam **OR** LORazepam **OR** LORazepam, polyvinyl alcohol  Physical Exam Vitals and nursing note reviewed.  Constitutional:      General: He is not in acute distress.    Appearance: He is ill-appearing.  Pulmonary:     Effort: No respiratory distress.  Skin:    General: Skin is warm and dry.  Neurological:     Mental Status: He is lethargic.     Motor: Weakness present.  Psychiatric:        Attention and Perception: Attention normal.        Behavior: Behavior is  cooperative.        Cognition and Memory: Cognition and memory normal.             Vital Signs: BP (!) 118/41 (BP Location: Right Arm)   Pulse (!) 43   Temp 97.7 F (36.5 C) (Oral)   Resp 19   SpO2 96%  SpO2: SpO2: 96 % O2 Device: O2 Device: Nasal Cannula O2 Flow Rate: O2 Flow Rate (L/min): 4.5 L/min  Intake/output summary:  Intake/Output Summary (Last 24 hours) at 02/07/2023 1129 Last data filed at 02/07/2023 0900 Gross per 24 hour  Intake 80 ml  Output --  Net 80 ml   LBM: Last BM Date : 02/05/23 Baseline Weight:   Most recent weight:         Palliative Assessment/Data: PPS 10-20%      Patient Active Problem List   Diagnosis Date Noted   Malnutrition of moderate degree 02/07/2023   Chest pain due to coronary artery disease ([place]) 01/22/2023   Pleural effusion on right 01/31/2023   Lobar pneumonia ([place]) 01/31/2023   Precordial pain 02/05/2023   Diarrhea 01/14/2023   Cancer related pain 01/14/2023   Weight loss 01/14/2023   Anemia due to antineoplastic chemotherapy 01/14/2023   Dehydration 12/25/2022   Sepsis due to undetermined organism ([place]) 12/12/2022   Normocytic anemia 12/12/2022   AKI (acute kidney injury) ([place]) 12/12/2022   Mild protein malnutrition ([place]) 12/12/2022   Neutropenia ([place]) 12/10/2022   Adenocarcinoma of right lung, stage 4 ([place]) 11/26/2022   Lytic bone lesion of hip 10/28/2022   Penetrating atherosclerotic ulcer of aorta ([place]) 10/28/2022   Intractable pain 10/27/2022   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   Preoperative cardiovascular examination 06/05/2022   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   Primary osteoarthritis of left hip 03/19/2022   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   Chronic gouty arthritis 04/23/2021   Primary osteoarthritis 04/23/2021   Abdominal wall pain 04/02/2021   Dysphagia 04/02/2021   Right flank pain 04/02/2021   Wears glasses    Left ureteral calculus    Hypertension     Hyperlipidemia    History of squamous cell carcinoma excision    Chronic gout    Right sided abdominal pain 01/14/2021   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 Recent imaging revealed progressive disease with lytic lesion to 9th rib and progressive lymphadenopathy. Dr. Earlie Server saw Mr. Nylen 02/05/23 at which time his IV chemo was stopped and he was to start on [place] pending financial assistance. Patient presented to ED on 02/05/23 from home with chest pain - work up revealed STEMI and after discussions with Cardiology patient opted to discharge home to enjoy his birthday with family and not pursue aggressive interventions. Patient returned to ED on 01/16/2023 from home with continued complaints of chest pain not managed by home regimen. Patient was admitted on 01/24/2023 with chest pain due to CAD, pneumonia, adenocarcinoma of right lung, intractable pain, pleural effusions.    Of note, patient is followed by outpatient Palliative Care at [facility] for symptom management - most recent appointment 01/21/23. Prior to hospitalization, patient's pain control regimen included: scheduled tylenol, 75 mcg fentanyl patch, low dose prednisone, 8mg  PO dilaudid PRN, gabapentin.  Assessment: Principal Problem:   Chest pain due to coronary artery disease (HCC) Active Problems:   Essential hypertension   Demyelinating neuropathy   Intractable pain   Adenocarcinoma of right lung, stage 4 (HCC)   Mild protein malnutrition (HCC)   Pleural effusion on  right   Lobar pneumonia (HCC)   Malnutrition of moderate degree   Terminal care  Recommendations/Plan: Continue full comfort measures Continue DNR/DNI as previously documented Anticipate hospital death - patient is likely not stable for transfer to hospice facility and symptoms would likely not be managed well at home Added orders for EOL symptom management and to reflect full comfort measures, as well as discontinued orders that were not focused on comfort Unrestricted visitation orders were placed per current [place] EOL visitation policy  Nursing to provide frequent assessments and administer PRN medications as clinically necessary to ensure EOL comfort PMT will continue to follow and support holistically  Symptom Management Continuous Dilaudid infusion; bolus doses for breakthrough symptoms Continue Solu-Medrol Continue fentanyl patch Tylenol PRN pain/fever Biotin twice daily Benadryl PRN itching Robinul PRN secretions Haldol PRN agitation/delirium Ativan PRN anxiety/seizure/sleep/distress Zofran PRN nausea/vomiting Liquifilm Tears PRN dry eye   Goals of Care and Additional Recommendations: Limitations on Scope of Treatment: Full Comfort Care  Code Status:    Code Status Orders  (From admission, onward)           Start     Ordered   02/07/23 1119  Do not attempt resuscitation (DNR)  Continuous       Question Answer Comment  If patient has no pulse and is not breathing Do Not Attempt Resuscitation   If patient has a pulse and/or is breathing: Medical Treatment Goals COMFORT MEASURES: Keep clean/warm/dry, use medication by any route; positioning, wound care and other measures to relieve pain/suffering; use oxygen, suction/manual treatment of airway obstruction for comfort; do not transfer unless for comfort needs.   Consent: Discussion documented in EHR or advanced directives reviewed      02/07/23 1120           Code Status History  Date Active Date  Inactive Code Status Order ID Comments User Context   02/05/2023 1933 02/07/2023 1120 DNR [id number]  Neena Rhymes, MD Inpatient   01/25/2023 1120 02/03/2023 1933 DNR [id number]  Neena Rhymes, MD ED   01/28/2023 1018 01/26/2023 1120 DNR [id number]  Cristie Hem, MD ED   12/12/2022 1500 12/15/2022 1644 Full Code [id number]  Reubin Milan, MD ED   10/27/2022 2236 11/02/2022 1834 Full Code [id number]  Clance Boll, MD Inpatient   06/30/2022 1107 07/01/2022 1612 Full Code [id number]  Leandrew Koyanagi, MD Inpatient   03/22/2020 2039 03/23/2020 2037 Full Code [id number]  Modena Jansky, MD Inpatient       Prognosis:  Hours - Days  Discharge Planning: Anticipated Hospital Death  Care plan was discussed with primary RN, patient's family, Dr. Lorin Mercy  Thank you for allowing the Palliative Medicine Team to assist in the care of this patient.   Total Time 70 minutes Prolonged Time Billed  yes       Greater than 50%  of this time was spent counseling and coordinating care related to the above assessment and plan.  Lin Landsman, NP  Please contact Palliative Medicine Team phone at [phone] for questions and concerns.   *Portions of this note are a verbal dictation therefore any spelling and/or grammatical errors are due to the "[employer]" system interpretation.

## 2023-02-07 NOTE — Progress Notes (Signed)
Progress Note   Patient: Thomas [name] [id number] DOB: 1951/09/19 DOA: 01/19/2023     1 DOS: the patient was seen and examined on 02/07/2023   Brief hospital course: Patient with h/o NSCLC diagnosed in 11/2021 with progressive metastatic disease despite aggressive therapy.  He was stopped on chemo on 02/05/23 by Dr Julien Nordmann and the plan was to start [facility]. However, he had a STEMI requiring admission on 3/21.  Dr. Virgina Jock suspected small [name] and the patient was not a candidate for invasive treatment and was started on DAPT and discharged.  He returned on 3/22 with severe chest pain. No new recommendations made by cardiology. Palliative care following, priority for patient and family is pain management and to go home with hospice care. Patient is currently on PCA Dilaudid with low BP. gave fluid, albumin with some improvement. Spoke with wife last night and she declined aggressive measures (invasive line, pressors), further work up....but wife also did not want to make him comfort care at this time.   Assessment and Plan: * Chest pain due to coronary artery disease ([place]) -Patient in cancer clinic 02/05/23 had EKG revealing anterior acute MI although he did not report typical anginal chest pain.  -He was referred to ED and was seen by Cardiology, Dr. Virgina Jock.  -Patient not considered a candidate for invasive intervention and was advised to treat with DAPT-ASA/Plavix and to continue BB with metoprolol 25 mg BID.  -He was pain free and was discharged to home.  -On 01/24/2023 he had severe recurrent right chest pain and returned to [name].  -Per cardiology there is a concern for possible post-MI inflammation pericarditis.  -Dr. Virgina Jock had no new recommendations.  -He confirmed with the patient and wife to not do any invasive treatment. O -He has markedly increased HS troponin -This appears to be a terminal STEMI in the setting of metastatic NSCLC -Based on discussion with wife and  children, and with assistance from palliative care, he will be transitioned to comfort measures only. -Patient is unstable for transfer to residential hospice -Comfort care order set utilized -Pain control with dilaudid drip  Adenocarcinoma of right lung, stage 4 (HCC) -Patient with Stage IV NSSC lung/adenocarcinoma.  -Followed by Dr. Julien Nordmann with last visit 02/05/23. -Has failed chemotherapy with recent CT revealing progressive disease including lytic lesin 9th rib, progressive lymphadenopathy.  -All chemo stopped but was to start [place] but awaiting financial application for assistance.  -Has seen palliative care as outpatient for symptom management.  -He has been on duragesic patch recently increased to 75 mcq, low dose steroids, dilaudid 4mg  increased to 8 mg prn.  -He has had relatively good pain control until this admission. -Based on this terminal condition in conjunction with ACS, the patient is no longer a candidate for alternative treatments and will transition to comfort care -Anticipate in-hospital death -Will add Dr. Julien Nordmann to the treatment team to make him aware  Intractable pain -Patient with multi-factorial origin of pain including OA, lytic bone lesions (9th rib), cardiac related inflammation.  -His usual home regimen of duragesic patch, prn dilaudid, gabapentin has not been providing control and in the ED he was in considerable pain.  -Discussed the multiple factors with the patient and his wife along with appropriate goal setting in regard to control vs relief.  -On admission, he was continued on duragesic patch 75 mcg q 72 hrs -He was also given a PCA dilaudid pump -He was started on steroids for inflammatory pain - d/c prednisone, solumedrol  125 mg IV followed after 8 hrs by solumedrol 40 mg bid. -Given his progressive disease, he is actively dying -His family has been assisting with the PCA -Will transition to Dilaudid continuous infusion (see above)  Lobar  pneumonia ([place]) -At presentation patient c/o right chest pain.  -He denies fever or cough.  -Leukocytosis present on admission labs to 20K with left shift.  -CXR 02/01/2023 revealed new RML., RLL infiltrates and increased effusion -Started on Oxygen at 2L for comfort -Given Rocephin and Azithromycin -Based on terminal condition, antibiotics are being stopped at this time  Pleural effusion on right -Patient with right sided pleural effusion 02/04/21.  -Patient with increased SOB today and CXR with increased pleural effusion -Plan on admission was for Oxygen support and treatment for PNA -F/u CXR 02/07/23 was stable  -Patient is transitioning to comfort measures and this will not be further followed -Likely malignant effusion  Moderate protein-calorie malnutrition ([place]) -Poor PO intake lately and he has been losing some weight. 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 -No longer needs gabapentin  Essential hypertension -Patient has had long-standing hypertension -Home meds of valsartan, amlodipine, and Toprol XL are now held given expected disease trajectory     Subjective: Family is at the bedside. They are devastated that his heart has been unable to endure the cancer situation but understand.  While they hoped to return home with hospice, they are accepting of the idea that he will likely have an in-hospital death (transport appears to be too difficult).  He has been sleeping throughout the morning and appears to have pain upon awakening so they have been pushing his  PCA for him.  They are not ready to stop treatment but are open to palliative and comfort is their first priority.  Physical Exam: Vitals:   02/07/23 0621 02/07/23 0800 02/07/23 0830 02/07/23 1205  BP: (!) 83/45  (!) 118/41 (!) 89/68  Pulse: (!) 41  (!) 43 60  Resp: 18 17 19 17   Temp: 97.8 F (36.6 C)  97.7 F (36.5 C) (!) 97.4 F (36.3 C)  TempSrc: Oral  Oral Oral  SpO2: 97% 95% 96% 95%   General:  Appears pale, critically ill but in NAD Eyes:   normal lids, closed throughout ENT:  grossly normal hearing, lips & tongue Neck:  no LAD, masses or thyromegaly Cardiovascular:  RR with mild bradycardia, no m/r/g. No LE edema.  Respiratory:   CTA bilaterally with no wheezes/rales/rhonchi.  Normal respiratory effort. Abdomen:  soft, NT, ND Skin:  no rash or induration seen on limited exam Musculoskeletal:  no bony abnormality Psychiatric: obtunded, responds only to pain currently Neurologic:  unable to perform   Radiological Exams on Admission: Independently reviewed - see discussion in A/P where applicable  DG CHEST PORT 1 VIEW  Result Date: 02/07/2023 CLINICAL DATA:  Pneumonia. EXAM: PORTABLE CHEST 1 VIEW COMPARISON:  February 06, 2023. FINDINGS: Stable cardiomediastinal silhouette. Left lung is clear. Stable right basilar atelectasis or pneumonia is noted with associated pleural effusion. Bony thorax is unremarkable. IMPRESSION: Stable right basilar atelectasis or pneumonia is noted with associated pleural effusion. Electronically Signed   By: Bobbe Medico.D.  On: 02/07/2023 10:35   CT Angio Chest Pulmonary Embolism (PE) W or WO Contrast  Result Date: 01/26/2023 CLINICAL DATA:  Pulmonary embolus suspected with high probability. Cancer patient with chest pain and shortness of breath. D-dimer 1.32. EXAM: CT ANGIOGRAPHY CHEST WITH CONTRAST TECHNIQUE: Multidetector CT imaging of the chest was performed using the standard protocol during bolus administration of intravenous contrast.  Multiplanar CT image reconstructions and MIPs were obtained to evaluate the vascular anatomy. RADIATION DOSE REDUCTION: This exam was performed according to the departmental dose-optimization program which includes automated exposure control, adjustment of the mA and/or kV according to patient size and/or use of iterative reconstruction technique. CONTRAST:  51mL OMNIPAQUE IOHEXOL 350 MG/ML SOLN COMPARISON:  02/02/2023 FINDINGS: Cardiovascular: There is good opacification of the central and segmental pulmonary arteries. No focal filling defects. No evidence of significant pulmonary embolus. There is however somewhat decreased perfusion overall to the right upper lung caused by extrinsic compression of the right upper lung pulmonary arteries caused by enlarged lymph nodes. Mild cardiac enlargement with right heart predominance. Reflux of contrast material into the hepatic veins suggest right heart failure. No pericardial effusions. Normal caliber thoracic aorta with scattered aortic calcification. Mediastinum/Nodes: Esophagus is decompressed. Prominent mediastinal and right hilar lymphadenopathy with largest right paratracheal nodes measuring up to about 3.5 cm short axis dimension. Lymphadenopathy in the anterior mediastinum. Less prominent left hilar lymphadenopathy. Lungs/Pleura: Large right pleural effusion with atelectasis in the right base, likely compressive. Right hilar mass causes extrinsic compression of the right mainstem bronchus. Scattered pulmonary nodules including a right upper lobe nodule at 5 mm diameter and a left lower lobe nodule at 8 mm diameter, likely metastatic. These are not changed since prior study. Left lung is clear, allowing for motion artifact. Upper Abdomen: Low-attenuation lesion in the dome of the liver measuring 1.5 cm diameter, not well characterized on this examination but appears cystic on prior study. No change. Lymphadenopathy in the right cardiophrenic angle, likely  metastatic. Musculoskeletal: Degenerative changes in the spine. Lucent lesion with some bone destruction involving the right posterior tenth rib, likely metastatic. Review of the MIP images confirms the above findings. IMPRESSION: 1. No evidence of significant pulmonary embolus. 2. Decreased perfusion to the right upper lung caused by extrinsic compression of the right upper lung pulmonary artery by the right hilar mass/lymphadenopathy. 3. Right hilar mass with hilar and mediastinal lymphadenopathy consistent with metastatic disease. 4. Right lung pulmonary nodules, likely metastatic. 5. Destructive lesion in the right tenth rib, likely metastatic. 6. Moderate right pleural effusion with basilar atelectasis. 7. Cardiac enlargement with evidence of right heart failure. Electronically Signed   By: Lucienne Capers M.D.   On: 01/27/2023 23:45   DG CHEST PORT 1 VIEW  Result Date: 01/18/2023 CLINICAL DATA:  Chest pain, pleural effusion EXAM: PORTABLE CHEST 1 VIEW COMPARISON:  Previous studies including the examination done on 02/05/2023 FINDINGS: Cardiac size is within normal limits. There is interval worsening of infiltrate in right mid and right lower lung fields. Right hemidiaphragm is obscured. There is blunting of right lateral CP angle. Prominence of right paratracheal soft tissues has not changed. Left lung is clear. There is no pneumothorax. IMPRESSION: There is interval worsening of infiltrates in right lung suggesting worsening of atelectasis/pneumonia. Part of the increased density in the right lower lung field most likely is due to increasing right pleural effusion. Electronically Signed   By: Elmer Picker M.D.   On: 01/17/2023 12:51    EKG: Independently reviewed.  3/22 at 0957 - NSR with rate 98; STEMI with acute inferior infarct, similar to 3/21 3/22 at 2034 - NSR with rate 62; more ectopy, 1st degree AV block, STEMI with acute inferior infarct, somewhat improved    Labs on Admission: I  have personally reviewed the available labs and imaging studies at the time of the admission.  Pertinent labs:    Glucose 126 BUN 16/Creatinine 1.26/GFR >60; 16/0.79/>60 on 3/21 BNP 348.7 HS troponin >24,000 WBC 18.9 Hgb 9.2  Family Communication: Wife and adult children were present throughout evaluation  Disposition: Status is: Inpatient Remains inpatient appropriate because: terminal condition, unstable for transfer  Planned Discharge Destination:  anticipate in-hospital death    Time spent: 50 minutes  Author: Karmen Bongo, MD 02/07/2023 3:21 PM  For on call review [email or web address].

## 2023-02-07 NOTE — Progress Notes (Signed)
Family arrived, per wife, they have spoken to Raenette Rover NP on phone. Assistance offered. Patient responding to family.

## 2023-02-07 NOTE — Progress Notes (Addendum)
   02/07/23 0026  Assess: MEWS Score  Temp (!) 97.5 F (36.4 C)  BP (!) 95/35  MAP (mmHg) (!) 54  Pulse Rate (!) 45  Resp 18  SpO2 100 %  O2 Device Nasal Cannula  Assess: MEWS Score  MEWS Temp 0  MEWS Systolic 1  MEWS Pulse 1  MEWS RR 0  MEWS LOC 0  MEWS Score 2  MEWS Score Color Yellow  Assess: if the MEWS score is Yellow or Red  Were vital signs taken at a resting state? Yes  Focused Assessment No change from prior assessment  Does the patient meet 2 or more of the SIRS criteria? No  MEWS guidelines implemented  Yes, yellow  Treat  MEWS Interventions Considered administering scheduled or prn medications/treatments as ordered  Take Vital Signs  Increase Vital Sign Frequency  Yellow: Q2hr x1, continue Q4hrs until patient remains green for 12hrs  Escalate  MEWS: Escalate Yellow: Discuss with charge nurse and consider notifying provider and/or RRT  Notify: Charge Nurse/RN  Name of Charge Nurse/RN Notified [place]  Provider Notification  Provider Name/Title A chavez,NP  Date Provider Notified 02/07/23  Time Provider Notified 0032  Method of Notification Page (SECURE CHAT)  Notification Reason Other (Comment) (Yellow MEWS,low BP)  Provider response See new orders  Date of Provider Response 02/07/23  Time of Provider Response 0032  Assess: SIRS CRITERIA  SIRS Temperature  0  SIRS Pulse 0  SIRS Respirations  0  SIRS WBC 0  SIRS Score Sum  0

## 2023-02-07 NOTE — Progress Notes (Signed)
   02/07/23 0400  Provider Notification  Provider Name/Title A Chavez,NP  Date Provider Notified 02/07/23  Time Provider Notified 0230  Method of Notification Page (secure chat)  Notification Reason Change in status (LOW  HR)  Provider response Other (Comment)  Date of Provider Response 02/07/23  Time of Provider Response 0247  Rapid Response Notification  Name of Rapid Response RN Notified Dave,RN  Date Rapid Response Notified 02/07/23  Time Rapid Response Notified 0255

## 2023-02-07 NOTE — Significant Event (Addendum)
Initiated rapid response, spoke to Thomas [name]. Heart rate ranging from 38-42, blood pressure is at 92/41 post bolus of 500 mls NS, patient respiration fluctuating between 10-16, he is on PCA. Albumin running at the moment.

## 2023-02-07 NOTE — Hospital Course (Signed)
Patient with h/o NSCLC diagnosed in 11/2021 with progressive metastatic disease despite aggressive therapy.  He was stopped on chemo on 02/05/23 by Dr Julien Nordmann and the plan was to start [facility]. However, he had a STEMI requiring admission on 3/21.  Dr. Virgina Jock suspected small [name] and the patient was not a candidate for invasive treatment and was started on DAPT and discharged.  He returned on 3/22 with severe chest pain. No new recommendations made by cardiology. Palliative care following, priority for patient and family is pain management and to go home with hospice care. Patient is currently on PCA Dilaudid with low BP. gave fluid, albumin with some improvement. Spoke with wife last night and she declined aggressive measures (invasive line, pressors), further work up....but wife also did not want to make him comfort care at this time.

## 2023-02-08 DIAGNOSIS — C799 Secondary malignant neoplasm of unspecified site: Secondary | ICD-10-CM | POA: Diagnosis not present

## 2023-02-08 DIAGNOSIS — I213 ST elevation (STEMI) myocardial infarction of unspecified site: Secondary | ICD-10-CM | POA: Diagnosis not present

## 2023-02-08 DIAGNOSIS — I2119 ST elevation (STEMI) myocardial infarction involving other coronary artery of inferior wall: Secondary | ICD-10-CM

## 2023-02-08 DIAGNOSIS — Z515 Encounter for palliative care: Secondary | ICD-10-CM | POA: Diagnosis not present

## 2023-02-08 DIAGNOSIS — R52 Pain, unspecified: Secondary | ICD-10-CM

## 2023-02-08 MED ORDER — LORAZEPAM BOLUS VIA INFUSION: 0.5000 mg | INTRAVENOUS | Status: DC | PRN

## 2023-02-08 MED ORDER — LORAZEPAM 2 MG/ML IJ SOLN: 1.0000 mg/h | INTRAVENOUS | Status: DC

## 2023-02-08 MED ORDER — MIDAZOLAM BOLUS VIA INFUSION: 1.0000 mg | INTRAVENOUS | Status: DC | PRN

## 2023-02-08 MED ORDER — MIDAZOLAM-SODIUM CHLORIDE 100-0.9 MG/100ML-% IV SOLN: 2.0000 mg/h | INTRAVENOUS | Status: DC

## 2023-02-09 ENCOUNTER — Other Ambulatory Visit (HOSPITAL_COMMUNITY): Payer: Self-pay

## 2023-02-11 DIAGNOSIS — G4733 Obstructive sleep apnea (adult) (pediatric): Secondary | ICD-10-CM | POA: Diagnosis not present

## 2023-02-12 ENCOUNTER — Other Ambulatory Visit: Payer: Medicare Other

## 2023-02-12 ENCOUNTER — Inpatient Hospital Stay: Payer: Medicare Other

## 2023-02-16 ENCOUNTER — Ambulatory Visit: Payer: Medicare Other | Admitting: Cardiology

## 2023-02-16 NOTE — Progress Notes (Signed)
Daily Progress Note   [name]       Date: 2023/02/22 DOB: 13-Sep-1951  Age: 72 y.o. MRN#: [id number] Attending Physician: Karmen Bongo, MD Primary Care Physician: Michael Boston, MD Admit Date: 01/21/2023  Reason for Consultation/Follow-up: Establishing goals of care  Subjective: Medical records reviewed including progress notes, labs, imaging.  Patient assessed at the bedside.  He is restless, lethargic, and agitated.  There are several family members present visiting.   Per family, patient has been agitated since about 3:30 in the morning and as needed medications such as Ativan and Haldol have only provided about 15 minutes of relief.  We discussed the option of administering an Ativan infusion for relief of agitation and anxiety.  Discussed that patient would need another IV for this.  Patient's family are agreeable if this will assist him in feeling more calm and comfortable.  Discussed with RN.  Discussed with pharmacist who recommended midazolam given Ativan shortage.  Questions and concerns addressed.  PMT will continue to follow and support.  Length of Stay: 2  Physical Exam Vitals and nursing note reviewed.  Constitutional:      Appearance: He is ill-appearing.  Pulmonary:     Effort: No respiratory distress.  Skin:    General: Skin is warm and dry.  Neurological:     Mental Status: He is lethargic.     Motor: Weakness present.  Psychiatric:        Behavior: Behavior is agitated.            Vital Signs: BP (!) 91/52 (BP Location: Right Arm)   Pulse (!) 40   Temp 97.8 F (36.6 C) (Oral)   Resp 17   SpO2 94%  SpO2: SpO2: 94 % O2 Device: O2 Device: Nasal Cannula O2 Flow Rate: O2 Flow Rate (L/min): 4.5 L/min       Palliative Assessment/Data: PPS  10-20%   Palliative Care Assessment & Plan   Patient Profile: 72 y.o. 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 Recent imaging revealed progressive disease with lytic lesion to 9th rib and progressive lymphadenopathy. Dr. Earlie Server saw Thomas [name] 02/05/23 at which time his IV chemo was stopped and he was to start on [place] pending financial assistance. Patient presented  to ED on 02/05/23 from home with chest pain - work up revealed STEMI and after discussions with Cardiology patient opted to discharge home to enjoy his birthday with family and not pursue aggressive interventions. Patient returned to ED on 02/11/2023 from home with continued complaints of chest pain not managed by home regimen. Patient was admitted on 01/26/2023 with chest pain due to CAD, pneumonia, adenocarcinoma of right lung, intractable pain, pleural effusions.    Of note, patient is followed by outpatient Palliative Care at [facility] for symptom management - most recent appointment 01/21/23. Prior to hospitalization, patient's pain control regimen included: scheduled tylenol, 75 mcg fentanyl patch, low dose prednisone, 8mg  PO dilaudid PRN, gabapentin.  Assessment: Principal Problem:   Chest pain due to coronary artery disease ([place]) Active Problems:   Essential hypertension   Demyelinating neuropathy   Intractable pain   Adenocarcinoma of right lung, stage 4 (HCC)   Moderate protein-calorie malnutrition (HCC)   Pleural effusion on right   Lobar pneumonia (HCC)   Terminal care  Recommendations/Plan: Continue DNR/DNI Continue comfort measures with adjustments to medications as below Psychosocial and emotional support provided PMT will continue to follow and support holistically  Symptom Management Ordered midazolam infusion with as needed boluses for anxiety/agitation Continue Dilaudid infusion; bolus doses for breakthrough symptoms Continue  Solu-Medrol Continue fentanyl patch Tylenol PRN pain/fever Biotin twice daily Benadryl PRN itching Robinul PRN secretions Haldol PRN agitation/delirium Ativan PRN anxiety/seizure/sleep/distress Zofran PRN nausea/vomiting Liquifilm Tears PRN dry eye  Prognosis:  Hours - Days  Discharge Planning: Anticipated Hospital Death  Care plan was discussed with primary RN, patient's family   MDM: High   Terryann Verbeek Gregary Signs Palliative Medicine Team Team phone # [phone]  Thank you for allowing the Palliative Medicine Team to assist in the care of this patient. Please utilize secure chat with additional questions, if there is no response within 30 minutes please call the above phone number.  Palliative Medicine Team providers are available by phone from 7am to 7pm daily and can be reached through the team cell phone.  Should this patient require assistance outside of these hours, please call the patient's attending physician.  Portions of this note are a verbal dictation therefore any spelling and/or grammatical errors are due to the "[employer]" system interpretation.

## 2023-02-16 NOTE — Progress Notes (Signed)
There was consult for new PIV access, but patient and patient's family refused to put in the new PIV access. Current PIV access was okay only interrupted flow when bend arm. Family requested increased dose of pain medication drip. Informed patient's RN Israa. HS [employer]

## 2023-02-16 NOTE — Progress Notes (Signed)
CHART NOTE I saw the patient today for a social visit.  His wife [name] and daughter [name] were available at the bedside.  He was admitted to the hospital with intractable pain.  Unfortunately his condition has deteriorated significantly in the last few days after he was found on imaging studies to have evidence for disease progression and then EKG showed acute myocardial infarction with STEMI and significantly elevated troponin level.  He was not a candidate for cardiac intervention.  He was supposed to start treatment with Alfred Levins (Adagrasib) for his progressive lung cancer but unfortunately he is not a candidate for this treatment anymore with the significant decline in his condition. I agree with the comfort care at this point.  The patient and his family are in agreement with this plan.  He was seen by the palliative care team and I am sure they will provide him with the best care to keep him comfortable during this time of end-of-life care. I offered the his wife and daughter my support and they know to reach out to me at any time after the can help in any way. Thank you so much for taking good care of Mr. Cassani.  Please call if you have any questions.

## 2023-02-16 NOTE — Progress Notes (Signed)
Pt getting agitated during the night and this morning. Family by the bedside ativan given.

## 2023-02-16 NOTE — Death Summary Note (Signed)
DEATH SUMMARY   Patient Details  Name: Thomas [name] MRN: [id number] DOB: 1951/05/22 [id number], Thomas Sans, MD Admission/Discharge Information   Admit Date:  2023-02-15  Date of Death: Date of Death: 2023/02/17  Time of Death: Time of Death: 02-23-34  Length of Stay: 2   Principle Cause of death: ST-Elevation Myocardial Infarction  Hospital Diagnoses: Principal Problem:   STEMI (ST elevation myocardial infarction) ([place]) Active Problems:   Adenocarcinoma of right lung, stage 4 (HCC)   Intractable pain   Lobar pneumonia ([place])   Pleural effusion on right   Hospital Course: Patient with h/o NSCLC diagnosed in 11/2021 with progressive metastatic disease despite aggressive therapy.  He was stopped on chemo on 02/05/23 by Dr Julien Nordmann and the plan was to start [facility]. However, he had a STEMI requiring admission on 3/21.  Dr. Virgina Jock suspected small [name] and the patient was not a candidate for invasive treatment and was started on DAPT and discharged.  He returned on 02-15-2023 with severe chest pain. No new recommendations made by cardiology. Palliative care following, priority for patient and family is pain management and initially hoped to go home with hospice care. Patient was started on Dilaudid PCA on admission and transitioned to comfort only with Dilaudid infusion on 3/23 with anticipation of in-hospital death.  Palliative care consulted.  Assessment and Plan: * STEMI (ST elevation myocardial infarction) ([place]) -Patient in cancer clinic 02/05/23 had EKG revealing anterior acute MI although he did not report typical anginal chest pain.  -He was referred to ED and was seen by Cardiology, Dr. Virgina Jock.  -Patient not considered a candidate for invasive intervention and was advised to treat with DAPT-ASA/Plavix and to continue BB with metoprolol 25 mg BID.  -He was pain free and was discharged to home.  -On 02-15-23 he had severe recurrent right chest pain and returned to [name].  -Per  cardiology, possible post-MI inflammation pericarditis.  -Dr. Virgina Jock had no new recommendations.  -He confirmed with the patient and wife to not do any invasive treatment.  -Markedly increased HS troponin -Unfortunately, this was a terminal STEMI in the setting of metastatic NSCLC -Based on discussion with wife and children, and with assistance from palliative care, he was transitioned to comfort measures only. -Patient was unstable for transfer to residential hospice -Comfort care order set utilized -Pain control with dilaudid drip -Patient died about 24 hours later with family at the bedside; they report that he was comfortable and are appreciative of the excellent care that he received  Adenocarcinoma of right lung, stage 4 ([place]) -Patient with Stage IV NSSC lung/adenocarcinoma.  -Followed by Dr. Julien Nordmann with last visit 02/05/23. -He failed chemotherapy with recent CT revealing progressive disease including lytic lesin 9th rib, progressive lymphadenopathy.  -All chemo stopped but was to start [place] but awaiting financial application for assistance.  -Has seen palliative care as outpatient for symptom management.  -He has been on duragesic patch recently increased to 75 mcq, low dose steroids, dilaudid 4mg  increased to 8 mg prn.  -He has had relatively good pain control until this admission. -Based on this terminal condition in conjunction with ACS, the patient was no longer a candidate for alternative treatments and transitioned to comfort care -Patient died peacefully about 24 hours after his transition to comfort measures -Will add Dr. Julien Nordmann to the treatment team to make him aware  Intractable pain -Patient with multi-factorial origin of pain including OA, lytic bone lesions (9th rib), cardiac related inflammation.  -His usual home  regimen of duragesic patch, prn dilaudid, gabapentin has not been providing control and in the ED he was in considerable pain.  -On admission, he was  continued on duragesic patch 75 mcg q 72 hrs -He was also given a PCA dilaudid pump -He was started on steroids for inflammatory pain - d/c prednisone, solumedrol 125 mg IV followed after 8 hrs by solumedrol 40 mg bid. -Transitioned to Dilaudid continuous infusion  Lobar pneumonia ([place]) -At presentation patient c/o right chest pain.  -No fever or cough.  -Leukocytosis present on admission labs to 20K with left shift.  -CXR 02/05/2023 revealed new RML., RLL infiltrates and increased effusion -Started on Oxygen at 2L for comfort -Given Rocephin and Azithromycin -Based on terminal condition, antibiotics stopped   Pleural effusion on right -Patient with right sided pleural effusion 02/04/21.  -Patient with increased SOB on admission and CXR with increased pleural effusion -Likely malignant effusion  Moderate protein-calorie malnutrition (HCC)-resolved as of March 07, 2023 -Poor PO intake lately and he has been losing some weight. 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 -No longer needs gabapentin  Essential hypertension-resolved as of 03-07-23 -Patient has had long-standing hypertension -Home meds of valsartan, amlodipine, and Toprol XL are now held given expected disease trajectory    Procedures: None  Consultations: Palliative care; oncology  The results of significant diagnostics from this hospitalization (including imaging, microbiology, ancillary and laboratory) are listed below for reference.   Significant Diagnostic Studies: DG CHEST  PORT 1 VIEW  Result Date: 02/07/2023 CLINICAL DATA:  Pneumonia. EXAM: PORTABLE CHEST 1 VIEW COMPARISON:  February 06, 2023. FINDINGS: Stable cardiomediastinal silhouette. Left lung is clear. Stable right basilar atelectasis or pneumonia is noted with associated pleural effusion. Bony thorax is unremarkable. IMPRESSION: Stable right basilar atelectasis or pneumonia is noted with associated pleural effusion. Electronically Signed   By: Marijo Conception M.D.   On: 02/07/2023 10:35   CT Angio Chest Pulmonary Embolism (PE) W or WO Contrast  Result Date: 02/07/2023 CLINICAL DATA:  Pulmonary embolus suspected with high probability. Cancer patient with chest pain and shortness of breath. D-dimer 1.32. EXAM: CT ANGIOGRAPHY CHEST WITH CONTRAST TECHNIQUE: Multidetector CT imaging of the chest was performed using the standard protocol during bolus administration of intravenous contrast. Multiplanar CT image reconstructions and MIPs were obtained to evaluate the vascular anatomy. RADIATION DOSE REDUCTION: This exam was performed according to the departmental dose-optimization program which includes automated exposure control, adjustment of the mA and/or kV according to patient size and/or use of iterative reconstruction technique. CONTRAST:  40mL OMNIPAQUE IOHEXOL 350 MG/ML SOLN COMPARISON:  02/02/2023 FINDINGS: Cardiovascular: There is good opacification of the central and segmental pulmonary arteries. No focal filling defects. No evidence of significant pulmonary embolus. There is however somewhat decreased perfusion overall to the right upper lung caused by extrinsic compression of the right upper lung pulmonary arteries caused by enlarged lymph nodes. Mild cardiac enlargement with right heart predominance. Reflux of contrast material into the hepatic veins suggest right heart failure. No pericardial effusions. Normal caliber thoracic aorta with scattered aortic calcification. Mediastinum/Nodes: Esophagus is decompressed.  Prominent mediastinal and right hilar lymphadenopathy with largest right paratracheal nodes measuring up  to about 3.5 cm short axis dimension. Lymphadenopathy in the anterior mediastinum. Less prominent left hilar lymphadenopathy. Lungs/Pleura: Large right pleural effusion with atelectasis in the right base, likely compressive. Right hilar mass causes extrinsic compression of the right mainstem bronchus. Scattered pulmonary nodules including a right upper lobe nodule at 5 mm diameter and a left lower lobe nodule at 8 mm diameter, likely metastatic. These are not changed since prior study. Left lung is clear, allowing for motion artifact. Upper Abdomen: Low-attenuation lesion in the dome of the liver measuring 1.5 cm diameter, not well characterized on this examination but appears cystic on prior study. No change. Lymphadenopathy in the right cardiophrenic angle, likely metastatic. Musculoskeletal: Degenerative changes in the spine. Lucent lesion with some bone destruction involving the right posterior tenth rib, likely metastatic. Review of the MIP images confirms the above findings. IMPRESSION: 1. No evidence of significant pulmonary embolus. 2. Decreased perfusion to the right upper lung caused by extrinsic compression of the right upper lung pulmonary artery by the right hilar mass/lymphadenopathy. 3. Right hilar mass with hilar and mediastinal lymphadenopathy consistent with metastatic disease. 4. Right lung pulmonary nodules, likely metastatic. 5. Destructive lesion in the right tenth rib, likely metastatic. 6. Moderate right pleural effusion with basilar atelectasis. 7. Cardiac enlargement with evidence of right heart failure. Electronically Signed   By: Lucienne Capers M.D.   On: 02/10/2023 23:45   DG CHEST PORT 1 VIEW  Result Date: 01/29/2023 CLINICAL DATA:  Chest pain, pleural effusion EXAM: PORTABLE CHEST 1 VIEW COMPARISON:  Previous studies including the examination done on 02/05/2023 FINDINGS:  Cardiac size is within normal limits. There is interval worsening of infiltrate in right mid and right lower lung fields. Right hemidiaphragm is obscured. There is blunting of right lateral CP angle. Prominence of right paratracheal soft tissues has not changed. Left lung is clear. There is no pneumothorax. IMPRESSION: There is interval worsening of infiltrates in right lung suggesting worsening of atelectasis/pneumonia. Part of the increased density in the right lower lung field most likely is due to increasing right pleural effusion. Electronically Signed   By: Elmer Picker M.D.   On: 02/03/2023 12:51   ECHOCARDIOGRAM LIMITED  Result Date: 02/05/2023    ECHOCARDIOGRAM LIMITED REPORT   Patient Name:   Thomas [name] Date of Exam: 02/05/2023 Medical Rec #:  [id number]      Height:       71.0 in Accession #:    [id number]     Weight:       174.2 lb Date of Birth:  October 13, 1951      BSA:          1.988 m Patient Age:    72 years       BP:           116/64 mmHg Patient Gender: M              HR:           127 bpm. Exam Location:  Inpatient Procedure: Limited Echo and Limited Color Doppler STAT ECHO Indications:     Chest Pain  History:         Patient has prior history of Echocardiogram examinations, most                  recent 03/23/2020. Stroke, Signs/Symptoms:Dyspnea and Chest Pain;                  Risk Factors:Hypertension and Dyslipidemia. Cancer, hx of  radiation.  Sonographer:     Eartha Inch Referring Phys:  [id number] [place] J PATWARDHAN Diagnosing Phys: Vernell Leep MD IMPRESSIONS  1. Left ventricular ejection fraction, by estimation, is 55 to 60%. The left ventricle has normal function. The left ventricle has no regional wall motion abnormalities. Left ventricular diastolic function could not be evaluated.  2. Right ventricular systolic function is normal. The right ventricular size is normal.  3. The mitral valve is grossly normal. Trivial mitral valve regurgitation. FINDINGS   Left Ventricle: Left ventricular ejection fraction, by estimation, is 55 to 60%. The left ventricle has normal function. The left ventricle has no regional wall motion abnormalities. The left ventricular internal cavity size was normal in size. There is  no left ventricular hypertrophy. Left ventricular diastolic function could not be evaluated. Right Ventricle: The right ventricular size is normal. No increase in right ventricular wall thickness. Right ventricular systolic function is normal. Pericardium: There is no evidence of pericardial effusion. Mitral Valve: The mitral valve is grossly normal. Trivial mitral valve regurgitation. Tricuspid Valve: The tricuspid valve is normal in structure. Tricuspid valve regurgitation is not demonstrated. No evidence of tricuspid stenosis. Aorta: The aortic root is normal in size and structure. Additional Comments: Color Doppler performed.  LEFT VENTRICLE PLAX 2D LVIDd:         4.30 cm LVIDs:         3.00 cm LV PW:         0.90 cm LV IVS:        1.00 cm LVOT diam:     2.20 cm LVOT Area:     3.80 cm  LEFT ATRIUM         Index LA diam:    3.20 cm 1.61 cm/m   AORTA Ao Root diam: 3.20 cm  SHUNTS Systemic Diam: 2.20 cm Vernell Leep MD Electronically signed by Vernell Leep MD Signature Date/Time: 02/05/2023/12:29:53 PM    Final    DG Chest Portable 1 View  Result Date: 02/05/2023 CLINICAL DATA:  Chest pain. EXAM: PORTABLE CHEST 1 VIEW COMPARISON:  Chest CT 02/02/2023. FINDINGS: Small right pleural effusion with adjacent right basilar atelectasis. Unchanged right-sided mediastinal and hilar lymphadenopathy. The left lung is clear. Stable cardiac and mediastinal contours. No pneumothorax. IMPRESSION: 1. Small right pleural effusion with adjacent right basilar atelectasis. 2. Unchanged right-sided mediastinal and hilar lymphadenopathy. Electronically Signed   By: Emmit Alexanders M.D.   On: 02/05/2023 11:44   CT Chest W Contrast  Result Date: 02/03/2023 CLINICAL  DATA:  Staging lung cancer on chemotherapy. * Tracking Code: BO * EXAM: CT CHEST, ABDOMEN, AND PELVIS WITH CONTRAST TECHNIQUE: Multidetector CT imaging of the chest, abdomen and pelvis was performed following the standard protocol during bolus administration of intravenous contrast. RADIATION DOSE REDUCTION: This exam was performed according to the departmental dose-optimization program which includes automated exposure control, adjustment of the mA and/or kV according to patient size and/or use of iterative reconstruction technique. CONTRAST:  121mL OMNIPAQUE IOHEXOL 300 MG/ML  SOLN COMPARISON:  CT angiogram chest and abdomen pelvis CT 12/12/2022. Older exams as well FINDINGS: CT CHEST FINDINGS Cardiovascular: Small pericardial effusion is increased from previous. The heart is nonenlarged. Coronary artery calcifications are seen. The thoracic aorta has some scattered partially calcified atherosclerotic plaque. Once again there is severe narrowing and poor enhancement of the right upper lobe pulmonary artery in the mediastinum and hilum consistent with encasing soft tissue mass. There is also narrowing of the SVC mildly by mediastinal mass lesions.  Again this was seen previously. Mediastinum/Nodes: Several abnormal lymph nodes are seen. There is a new left supraclavicular node at the thoracic inlet the level of the left thyroid lobe measuring 16 by 12 mm on series 2 image 3. No specific other abnormal node enlargement in the axillary region. Bilateral abnormal hilar nodes are identified. On the left there is an increasing node measuring 2.4 x 1.6 cm. Previously there is abnormal tissue in this location measuring 1.6 by 1.1 cm. Confluence abnormal soft tissue seen in the right side of the mediastinum. Area measured lower right paratracheal, precarinal on series 2, image 22 today measures 4.2 x 3.5 cm and previously 4.1 by 2.9 cm. Soft tissue nodule anterior to the SVC brachiocephalic vein junction on series 2, image  19 today measures 19 by 18 mm and previously would have measured 2.1 x 2.1 cm. There is also an abnormal lymph node along the internal mammary chain which is increased. Today on series 2, image 25 this measures 17 x 14 mm and previously 13 x 10 mm. There is also increasing nodes along the right anterior cardiophrenic space on image 47 and 48 of series 2. Normal caliber thoracic esophagus. Lungs/Pleura: Left lung is grossly clear. No consolidation, pneumothorax or effusion. Only minimal dependent atelectasis. There is increasing now moderate right pleural effusion with compressive atelectasis in the right lower lobe and some punctate calcifications. There is some interstitial thickening along the right upper lobe decreasing ground-glass. There are developing pleural nodules identified. Example medial right lower lobe on series 2, image 46 measuring 3.0 by 2.0 cm. Areas of pleural thickening posteriorly on image 35 of series 2. Additional thickening more caudal on series 2, image 50. Musculoskeletal: There is also associated involvement of the posterior aspect of the right ninth rib. Likely related to direct invasion by the pleural soft tissue extending into the chest wall on series 2, image 37. There is also a healing fracture of the posterior aspect of the right eleventh rib CT ABDOMEN PELVIS FINDINGS Hepatobiliary: Small hepatic cysts are identified. Focal fat deposition seen in the liver adjacent to the falciform ligament. Patent portal vein. The gallbladder is somewhat distended but unchanged from previous. Pancreas: Unremarkable. No pancreatic ductal dilatation or surrounding inflammatory changes. Spleen: Normal in size without focal abnormality. Adrenals/Urinary Tract: Right adrenal gland is preserved. There are 2 left adrenal nodules which have increased in size. For example the more superior focus which previously measured 11 x 9 mm, today measures 18 by 13 mm. The other lesions also larger more caudal.  Bosniak 1 bilateral renal cysts are stable. No specific follow-up. The ureters have normal course and caliber extending down to the bladder. Preserved contours of the urinary bladder. Stomach/Bowel: The large bowel has a normal course and caliber with diffuse colonic stool. Left-sided colonic diverticula. Normal appendix. The stomach and small bowel are nondilated except for a loop of small bowel in the midabdomen which has diameter of 3.3 cm. This has a is mildly dilated. No abrupt transition there is small bowel stool appearance. This could be related to slow transit. Vascular/Lymphatic: Diffuse irregular atherosclerotic plaque along the aorta and iliac vessels with areas of stenosis. Preserved IVC. There is new abnormal lymph node seen towards the gastrohepatic ligament on series 2, image 56 measuring 15 by 12 mm today and previously less than 5 mm in short axis. Abnormal nodes seen retrocrural as well as posterior to the intrahepatic IVC on image 53. Reproductive: Prostate is unremarkable. Other: Mild anasarca.  No ascites. 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 There are destructive lytic bone lesions once again identified along the pelvis including the left iliac bone on image 95 and posterior medial right iliac bone such as image 91. These are increasing in size and extent. This there are also lesions developing along the lumbar spine. IMPRESSION: Overall progression of disease. 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 In addition there is one area which appears to extend posteriorly through the chest wall and involve the right ninth rib. Increased lymph nodes identified in the mediastinum, retrocrural, upper abdomen and left side thoracic inlet. 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 Once again there is a dilated gallbladder. If there is concern of gallbladder pathology additional workup with  ultrasound as clinically appropriate. Solitary loop of small bowel which is mildly dilated in the left midabdomen without a focal transition. Nonspecific. Findings will be called to the ordering service by the Radiology physician assistant team Electronically Signed   By: Jill Side M.D.   On: 02/03/2023 10:40   CT Abdomen Pelvis W Contrast  Result Date: 02/03/2023 CLINICAL DATA:  Staging lung cancer on chemotherapy. * Tracking Code: BO * EXAM: CT CHEST, ABDOMEN, AND PELVIS WITH CONTRAST TECHNIQUE: Multidetector CT imaging of the chest, abdomen and pelvis was performed following the standard protocol during bolus administration of intravenous contrast. RADIATION DOSE REDUCTION: This exam was performed according to the departmental dose-optimization program which includes automated exposure control, adjustment of the mA and/or kV according to patient size and/or use of iterative reconstruction technique. CONTRAST:  123mL OMNIPAQUE IOHEXOL 300 MG/ML  SOLN COMPARISON:  CT angiogram chest and abdomen pelvis CT 12/12/2022. Older exams as well FINDINGS: CT CHEST FINDINGS Cardiovascular: Small pericardial effusion is increased from previous. The heart is nonenlarged. Coronary artery calcifications are seen. The thoracic aorta has some scattered partially calcified atherosclerotic plaque. Once again there is severe narrowing and poor enhancement of the right upper lobe pulmonary artery in the mediastinum and hilum consistent with encasing soft tissue mass. There is also narrowing of the SVC mildly by mediastinal mass lesions. Again this was seen previously. Mediastinum/Nodes: Several abnormal lymph nodes are seen. There is a new left supraclavicular node at the thoracic inlet the level of the left thyroid lobe measuring 16 by 12 mm on series 2 image 3. No specific other abnormal node enlargement in the axillary region. Bilateral abnormal hilar nodes are identified. On the left there is an increasing node measuring 2.4  x 1.6 cm. Previously there is abnormal tissue in this location measuring 1.6 by 1.1 cm. Confluence abnormal soft tissue seen in the right side of the mediastinum. Area measured lower right paratracheal, precarinal on series 2, image 22 today measures 4.2 x 3.5 cm and previously 4.1 by 2.9 cm. Soft tissue nodule anterior to the SVC brachiocephalic vein junction on series 2, image 19 today measures 19 by 18 mm and previously would have measured 2.1 x 2.1 cm. There is also an abnormal lymph node along the internal mammary chain which is increased. Today on series 2, image 25 this measures 17 x 14 mm and previously 13 x 10 mm. There is also increasing nodes along the right anterior cardiophrenic space on image 47 and 48 of series 2. Normal caliber thoracic esophagus. Lungs/Pleura: Left lung is grossly clear. No consolidation, pneumothorax or effusion. Only minimal dependent atelectasis. There is increasing now moderate right pleural effusion with compressive atelectasis in the right lower  lobe and some punctate calcifications. There is some interstitial thickening along the right upper lobe decreasing ground-glass. There are developing pleural nodules identified. Example medial right lower lobe on series 2, image 46 measuring 3.0 by 2.0 cm. Areas of pleural thickening posteriorly on image 35 of series 2. Additional thickening more caudal on series 2, image 50. Musculoskeletal: There is also associated involvement of the posterior aspect of the right ninth rib. Likely related to direct invasion by the pleural soft tissue extending into the chest wall on series 2, image 37. There is also a healing fracture of the posterior aspect of the right eleventh rib CT ABDOMEN PELVIS FINDINGS Hepatobiliary: Small hepatic cysts are identified. Focal fat deposition seen in the liver adjacent to the falciform ligament. Patent portal vein. The gallbladder is somewhat distended but unchanged from previous. Pancreas: Unremarkable. No  pancreatic ductal dilatation or surrounding inflammatory changes. Spleen: Normal in size without focal abnormality. Adrenals/Urinary Tract: Right adrenal gland is preserved. There are 2 left adrenal nodules which have increased in size. For example the more superior focus which previously measured 11 x 9 mm, today measures 18 by 13 mm. The other lesions also larger more caudal. Bosniak 1 bilateral renal cysts are stable. No specific follow-up. The ureters have normal course and caliber extending down to the bladder. Preserved contours of the urinary bladder. Stomach/Bowel: The large bowel has a normal course and caliber with diffuse colonic stool. Left-sided colonic diverticula. Normal appendix. The stomach and small bowel are nondilated except for a loop of small bowel in the midabdomen which has diameter of 3.3 cm. This has a is mildly dilated. No abrupt transition there is small bowel stool appearance. This could be related to slow transit. Vascular/Lymphatic: Diffuse irregular atherosclerotic plaque along the aorta and iliac vessels with areas of stenosis. Preserved IVC. There is new abnormal lymph node seen towards the gastrohepatic ligament on series 2, image 56 measuring 15 by 12 mm today and previously less than 5 mm in short axis. Abnormal nodes seen retrocrural as well as posterior to the intrahepatic IVC on image 53. Reproductive: Prostate is unremarkable. Other: Mild anasarca.  No ascites. 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 There are destructive lytic bone lesions once again identified along the pelvis including the left iliac bone on image 95 and posterior medial right iliac bone such as image 91. These are increasing in size and extent. This there are also lesions developing along the lumbar spine. IMPRESSION: Overall progression of disease. 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 In addition there is one area which appears  to extend posteriorly through the chest wall and involve the right ninth rib. Increased lymph nodes identified in the mediastinum, retrocrural, upper abdomen and left side thoracic inlet. 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 Once again there is a dilated gallbladder. If there is concern of gallbladder pathology additional workup with ultrasound as clinically appropriate. Solitary loop of small bowel which is mildly dilated in the left midabdomen without a focal transition. Nonspecific. Findings will be called to the ordering service by the Radiology physician assistant team Electronically Signed   By: Jill Side M.D.   On: 02/03/2023 10:40    Microbiology: No results found for this or any previous visit (from the past 240 hour(s)).  Time spent: 30 minutes  Signed: Karmen Bongo, MD 02-17-2023

## 2023-02-16 NOTE — Progress Notes (Signed)
27 mL of dilaudid wasted with Prentiss Bells, RN

## 2023-02-16 NOTE — Progress Notes (Signed)
Pt passed away at [place] verified and called time of death. Dr. Lorin Mercy also messaged and notified.

## 2023-02-16 NOTE — Progress Notes (Addendum)
Progress Note   Patient: Thomas [name] [id number] DOB: 10-28-1951 DOA: 02/15/2023     2 DOS: the patient was seen and examined on 02/27/23   Brief hospital course: Patient with h/o NSCLC diagnosed in 11/2021 with progressive metastatic disease despite aggressive therapy.  He was stopped on chemo on 02/05/23 by Dr Julien Nordmann and the plan was to start [name]. However, he had a STEMI requiring admission on 3/21.  Dr. Virgina Jock suspected small [name] and the patient was not a candidate for invasive treatment and was started on DAPT and discharged.  He returned on 3/22 with severe chest pain. No new recommendations made by cardiology. Palliative care following, priority for patient and family is pain management and initially hoped to go home with hospice care. Patient was started on Dilaudid PCA on admission and transitioned to comfort only with Dilaudid infusion on 3/23 with anticipation of in-hospital death.  Palliative care consulted.  Assessment and Plan: * Chest pain due to coronary artery disease ([name]) -Patient in cancer clinic 02/05/23 had EKG revealing anterior acute MI although he did not report typical anginal chest pain.  -He was referred to ED and was seen by Cardiology, Dr. Virgina Jock.  -Patient not considered a candidate for invasive intervention and was advised to treat with DAPT-ASA/Plavix and to continue BB with metoprolol 25 mg BID.  -He was pain free and was discharged to home.  -On 02/05/2023 he had severe recurrent right chest pain and returned to [name].  -Per cardiology there is a concern for possible post-MI inflammation pericarditis.  -Dr. Virgina Jock had no new recommendations.  -He confirmed with the patient and wife to not do any invasive treatment.  -He has markedly increased HS troponin -This appears to be a terminal STEMI in the setting of metastatic NSCLC -Based on discussion with wife and children, and with assistance from palliative care, he was transitioned to  comfort measures only. -Patient is unstable for transfer to residential hospice -Comfort care order set utilized -Pain control with dilaudid drip  Adenocarcinoma of right lung, stage 4 (HCC) -Patient with Stage IV NSSC lung/adenocarcinoma.  -Followed by Dr. Julien Nordmann with last visit 02/05/23. -Has failed chemotherapy with recent CT revealing progressive disease including lytic lesin 9th rib, progressive lymphadenopathy.  -All chemo stopped but was to start [name] but awaiting financial application for assistance.  -Has seen palliative care as outpatient for symptom management.  -He has been on duragesic patch recently increased to 75 mcq, low dose steroids, dilaudid 4mg  increased to 8 mg prn.  -He has had relatively good pain control until this admission. -Based on this terminal condition in conjunction with ACS, the patient is no longer a candidate for alternative treatments and transitioned to comfort care -Anticipate in-hospital death -Will add Dr. Julien Nordmann to the treatment team to make him aware  Intractable pain -Patient with multi-factorial origin of pain including OA, lytic bone lesions (9th rib), cardiac related inflammation.  -His usual home regimen of duragesic patch, prn dilaudid, gabapentin has not been providing control and in the ED he was in considerable pain.  -On admission, he was continued on duragesic patch 75 mcg q 72 hrs -He was also given a PCA dilaudid pump -He was started on steroids for inflammatory pain - d/c prednisone, solumedrol 125 mg IV followed after 8 hrs by solumedrol 40 mg bid. -Given his progressive disease, he is actively dying -Transitioned to Dilaudid continuous infusion (see above)  Lobar pneumonia ([name]) -At presentation patient c/o right chest pain.  -He  denies fever or cough.  -Leukocytosis present on admission labs to 20K with left shift.  -CXR 01/22/2023 revealed new RML., RLL infiltrates and increased effusion -Started on Oxygen at 2L for  comfort -Given Rocephin and Azithromycin -Based on terminal condition, antibiotics stopped   Pleural effusion on right -Patient with right sided pleural effusion 02/04/21.  -Patient with increased SOB on admission and CXR with increased pleural effusion -Plan on admission was for Oxygen support and treatment for PNA -F/u CXR 02/07/23 was stable  -Patient transitioned to comfort measures and this will not be further followed -Likely malignant effusion  Moderate protein-calorie malnutrition ([name]) -Poor PO intake lately and he has been losing some weight. 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 -No longer needs gabapentin  Essential hypertension -Patient has had long-standing hypertension -Home meds of valsartan, amlodipine, and Toprol XL are now held given expected disease trajectory     Subjective: His family was concerned this AM about his agitation - taking off [name], trying to get out of bed.  Overall, his pain is controlled.  Physical Exam: Vitals:   02/07/23 1205 02/07/23 2014 02/07/23 2037 02-20-2023 1231  BP: (!) 89/68  (!) 91/52   Pulse: 60  (!) 40   Resp: 17     Temp: (!) 97.4 F (36.3 C)  97.8 F (36.6 C)   TempSrc: Oral  Oral   SpO2: 95% 95% 94%   Weight:    79 kg  Height:    5\' 11"  (1.803 m)   General:  Appears pale, critically ill but in NAD, mildly agitated Eyes:   normal lids, clear conjunctivae when lids are open ENT:  grossly normal hearing, lips & tongue Neck:  no LAD, masses or thyromegaly Cardiovascular:  RRR, no m/r/g. No LE edema.   Respiratory:   CTA bilaterally with no wheezes/rales/rhonchi.  Normal respiratory effort. Abdomen:  soft, NT, ND Skin:  no rash or induration seen on limited exam Musculoskeletal:  no bony abnormality Psychiatric: awake, responds to some questions and commands, mildly agitated Neurologic:  unable to perform successfully   Radiological Exams on Admission: Independently reviewed - see discussion in A/P where applicable  DG CHEST PORT 1 VIEW  Result Date: 02/07/2023 CLINICAL DATA:  Pneumonia. EXAM: PORTABLE CHEST 1 VIEW COMPARISON:  February 06, 2023. FINDINGS: Stable cardiomediastinal silhouette. Left lung is clear. Stable right basilar atelectasis or pneumonia is noted with associated pleural effusion. Bony thorax is unremarkable. IMPRESSION: Stable right basilar atelectasis or pneumonia is noted with associated pleural effusion. Electronically Signed   By: Marijo Conception M.D.   On: 02/07/2023 10:35   CT Angio Chest Pulmonary Embolism (PE) W or WO Contrast  Result Date: 01/30/2023 CLINICAL DATA:  Pulmonary embolus suspected with high probability. Cancer patient with chest pain and shortness of breath. D-dimer 1.32. EXAM: CT ANGIOGRAPHY CHEST WITH CONTRAST TECHNIQUE: Multidetector CT imaging of the chest was performed using the standard protocol during bolus administration of intravenous contrast. Multiplanar CT image reconstructions and MIPs were obtained to evaluate the vascular anatomy. RADIATION DOSE REDUCTION: This exam was performed according to the departmental dose-optimization program which includes automated exposure control, adjustment of the mA and/or kV  according to patient size and/or use of iterative reconstruction technique. CONTRAST:  67mL OMNIPAQUE IOHEXOL 350 MG/ML SOLN COMPARISON:  02/02/2023 FINDINGS: Cardiovascular: There is good opacification of the central and segmental pulmonary arteries. No focal filling defects. No evidence of significant pulmonary embolus. There is however  somewhat decreased perfusion overall to the right upper lung caused by extrinsic compression of the right upper lung pulmonary arteries caused by enlarged lymph nodes. Mild cardiac enlargement with right heart predominance. Reflux of contrast material into the hepatic veins suggest right heart failure. No pericardial effusions. Normal caliber thoracic aorta with scattered aortic calcification. Mediastinum/Nodes: Esophagus is decompressed. Prominent mediastinal and right hilar lymphadenopathy with largest right paratracheal nodes measuring up to about 3.5 cm short axis dimension. Lymphadenopathy in the anterior mediastinum. Less prominent left hilar lymphadenopathy. Lungs/Pleura: Large right pleural effusion with atelectasis in the right base, likely compressive. Right hilar mass causes extrinsic compression of the right mainstem bronchus. Scattered pulmonary nodules including a right upper lobe nodule at 5 mm diameter and a left lower lobe nodule at 8 mm diameter, likely metastatic. These are not changed since prior study. Left lung is clear, allowing for motion artifact. Upper Abdomen: Low-attenuation lesion in the dome of the liver measuring 1.5 cm diameter, not well characterized on this examination but appears cystic on prior study. No change. Lymphadenopathy in the right cardiophrenic angle, likely metastatic. Musculoskeletal: Degenerative changes in the spine. Lucent lesion with some bone destruction involving the right posterior tenth rib, likely metastatic. Review of the MIP images confirms the above findings. IMPRESSION: 1. No evidence of significant pulmonary embolus. 2. Decreased perfusion to the right upper lung caused by extrinsic compression of the right upper lung pulmonary artery by the right hilar mass/lymphadenopathy. 3. Right hilar mass with hilar and mediastinal lymphadenopathy consistent with metastatic disease. 4. Right lung pulmonary nodules, likely metastatic. 5. Destructive lesion in the  right tenth rib, likely metastatic. 6. Moderate right pleural effusion with basilar atelectasis. 7. Cardiac enlargement with evidence of right heart failure. Electronically Signed   By: Lucienne Capers M.D.   On: 02/05/2023 23:45    EKG: none since 3/22   Labs on Admission: I have personally reviewed the available labs and imaging studies at the time of the admission.  Pertinent labs:    None  Family Communication: Wife and daughter were present throughout  Disposition: Status is: Inpatient Remains inpatient appropriate because: actively dying  Planned Discharge Destination: anticipate In-hospital death    Time spent: 35 minutes  Author: Karmen Bongo, MD 02-09-23 1:02 PM  For on call review [email or web address].

## 2023-02-16 NOTE — Death Summary Note (Signed)
DEATH SUMMARY   Patient Details  Name: Thomas [name] MRN: [id number] DOB: Jul 06, 1951 [id number], [name], MD Admission/Discharge Information   Admit Date:  02-27-2023  Date of Death: Date of Death: Mar 01, 2023  Time of Death: Time of Death: Mar 07, 1234  Length of Stay: 2   Principle Cause of death: Acute ST Elevation Myocardial Infarction in the setting of Stage IV Non-Small Cell Lung Cancer  Hospital Diagnoses: Principal Problem:   STEMI (ST elevation myocardial infarction) ([place]) Active Problems:   Adenocarcinoma of right lung, stage 4 (HCC)   Intractable pain   Lobar pneumonia ([place])   Pleural effusion on right   Hospital Course: Patient with h/o NSCLC diagnosed in 11/2021 with progressive metastatic disease despite aggressive therapy.  He was stopped on chemo on 02/05/23 by Dr Julien Nordmann and the plan was to start [facility]. However, he had a STEMI requiring admission on 3/21.  Dr. Virgina Jock suspected small [name] and the patient was not a candidate for invasive treatment and was started on DAPT and discharged.  He returned on 02/27/2023 with severe chest pain. No new recommendations made by cardiology. Palliative care following, priority for patient and family is pain management and initially hoped to go home with hospice care. Patient was started on Dilaudid PCA on admission and transitioned to comfort only with Dilaudid infusion on 3/23 with anticipation of in-hospital death.  Palliative care consulted.  Assessment and Plan: * STEMI (ST elevation myocardial infarction) ([place]) -Patient in cancer clinic 02/05/23 had EKG revealing anterior acute MI although he did not report typical anginal chest pain.  -He was referred to ED and was seen by Cardiology, Dr. Virgina Jock.  -Patient not considered a candidate for invasive intervention and was advised to treat with DAPT-ASA/Plavix and to continue BB with metoprolol 25 mg BID.  -He was pain free and was discharged to home.  -On 02-27-2023 he had severe  recurrent right chest pain and returned to [name].  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  -Dr. Virgina Jock had no new recommendations.  -He confirmed with the patient and wife to not do any invasive treatment.  -Markedly increased HS troponin -Unfortunately, this was a terminal STEMI in the setting of metastatic NSCLC -Based on discussion with wife and children, and with assistance from palliative care, he was transitioned to comfort measures only. -Patient was unstable for transfer to residential hospice -Comfort care order set utilized -Pain control with dilaudid drip -Patient died about 24 hours later with family at the bedside; they report that he was comfortable and are appreciative of the excellent care that he received  Adenocarcinoma of right lung, stage 4 ([place]) -Patient with Stage IV NSSC lung/adenocarcinoma.  -Followed by Dr. Julien Nordmann with last visit 02/05/23. -He failed chemotherapy with recent CT revealing progressive disease including lytic lesin 9th rib, progressive lymphadenopathy.  -All chemo stopped but was to start [place] but awaiting financial application for assistance.  -Has seen palliative care as outpatient for symptom management.  -He has been on duragesic patch recently increased to 75 mcq, low dose steroids, dilaudid 4mg  increased to 8 mg prn.  -He has had relatively good pain control until this admission. -Based on this terminal condition in conjunction with ACS, the patient was no longer a candidate for alternative treatments and transitioned to comfort care -Patient died peacefully about 24 hours after his transition to comfort measures -Will add Dr. 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  -His usual home regimen of duragesic patch, prn dilaudid, gabapentin has not been providing control and in the  ED he was in considerable pain.  -On admission, he was continued on duragesic patch 75 mcg q 72 hrs -He was also given a PCA dilaudid pump -He was started on steroids for inflammatory pain - d/c prednisone, solumedrol 125 mg IV followed after 8 hrs by solumedrol 40 mg bid. -Transitioned to Dilaudid continuous infusion  Lobar pneumonia ([place]) -At presentation patient c/o right chest pain.  -No fever or cough.  -Leukocytosis present on admission labs to 20K with left shift.  -CXR 02/03/2023 revealed new RML., RLL infiltrates and increased effusion -Started on Oxygen at 2L for comfort -Given Rocephin and Azithromycin -Based on terminal condition, antibiotics stopped   Pleural effusion on right -Patient with right sided pleural effusion 02/04/21.  -Patient with increased SOB on admission and CXR with increased pleural effusion -Likely malignant effusion  Moderate protein-calorie malnutrition (HCC)-resolved as of 02/16/23 -Poor PO intake lately and he has been losing some weight. 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 -No longer needs gabapentin  Essential hypertension-resolved as of 2023-02-16 -Patient has had long-standing hypertension -Home meds of valsartan, amlodipine, and Toprol XL are now held given expected disease trajectory     Procedures: None  Consultations: Palliative care, cardiology  The results of significant diagnostics from this hospitalization (including imaging, microbiology, ancillary and laboratory) are listed below for  reference.   Significant Diagnostic Studies: DG CHEST PORT 1 VIEW  Result Date: 02/07/2023 CLINICAL DATA:  Pneumonia. EXAM: PORTABLE CHEST 1 VIEW COMPARISON:  February 06, 2023. FINDINGS: Stable cardiomediastinal silhouette. Left lung is clear. Stable right basilar atelectasis or pneumonia is noted with associated pleural effusion. Bony thorax is unremarkable. IMPRESSION: Stable right basilar atelectasis or pneumonia is noted with associated pleural effusion. Electronically Signed   By: Marijo Conception M.D.   On: 02/07/2023 10:35   CT Angio Chest Pulmonary Embolism (PE) W or WO Contrast  Result Date: 01/26/2023 CLINICAL DATA:  Pulmonary embolus suspected with high probability. Cancer patient with chest pain and shortness of breath. D-dimer 1.32. EXAM: CT ANGIOGRAPHY CHEST WITH CONTRAST TECHNIQUE: Multidetector CT imaging of the chest was performed using the standard protocol during bolus administration of intravenous contrast. Multiplanar CT image reconstructions and MIPs were obtained to evaluate the vascular anatomy. RADIATION DOSE REDUCTION: This exam was performed according to the departmental dose-optimization program which includes automated exposure control, adjustment of the mA and/or kV according to patient size and/or use of iterative reconstruction technique. CONTRAST:  58mL OMNIPAQUE IOHEXOL 350 MG/ML SOLN COMPARISON:  02/02/2023 FINDINGS: Cardiovascular: There is good opacification of the central and segmental pulmonary arteries. No focal filling defects. No evidence of significant pulmonary embolus. There is however somewhat decreased perfusion overall to the right upper lung caused by extrinsic compression of the right upper lung pulmonary arteries caused by enlarged lymph nodes. Mild cardiac enlargement with right heart predominance. Reflux of contrast material into the hepatic veins suggest right heart failure. No pericardial effusions. Normal caliber thoracic aorta with scattered aortic  calcification. Mediastinum/Nodes: Esophagus is decompressed.  Prominent mediastinal and right hilar lymphadenopathy with largest right paratracheal nodes measuring up to about 3.5 cm short axis dimension. Lymphadenopathy in the anterior mediastinum. Less prominent left hilar lymphadenopathy. Lungs/Pleura: Large right pleural effusion with atelectasis in the right base, likely compressive. Right hilar mass causes extrinsic compression of the right mainstem bronchus. Scattered pulmonary nodules including a right upper lobe nodule at 5 mm diameter and a left lower lobe nodule at 8 mm diameter, likely metastatic. These are not changed since prior study. Left lung is clear, allowing for motion artifact. Upper Abdomen: Low-attenuation lesion in the dome of the liver measuring 1.5 cm diameter, not well characterized on this examination but appears cystic on prior study. No change. Lymphadenopathy in the right cardiophrenic angle, likely metastatic. Musculoskeletal: Degenerative changes in the spine. Lucent lesion with some bone destruction involving the right posterior tenth rib, likely metastatic. Review of the MIP images confirms the above findings. IMPRESSION: 1. No evidence of significant pulmonary embolus. 2. Decreased perfusion to the right upper lung caused by extrinsic compression of the right upper lung pulmonary artery by the right hilar mass/lymphadenopathy. 3. Right hilar mass with hilar and mediastinal lymphadenopathy consistent with metastatic disease. 4. Right lung pulmonary nodules, likely metastatic. 5. Destructive lesion in the right tenth rib, likely metastatic. 6. Moderate right pleural effusion with basilar atelectasis. 7. Cardiac enlargement with evidence of right heart failure. Electronically Signed   By: Lucienne Capers M.D.   On: 02/07/2023 23:45   DG CHEST PORT 1 VIEW  Result Date: 02/14/2023 CLINICAL DATA:  Chest pain, pleural effusion EXAM: PORTABLE CHEST 1 VIEW COMPARISON:  Previous studies  including the examination done on 02/05/2023 FINDINGS: Cardiac size is within normal limits. There is interval worsening of infiltrate in right mid and right lower lung fields. Right hemidiaphragm is obscured. There is blunting of right lateral CP angle. Prominence of right paratracheal soft tissues has not changed. Left lung is clear. There is no pneumothorax. IMPRESSION: There is interval worsening of infiltrates in right lung suggesting worsening of atelectasis/pneumonia. Part of the increased density in the right lower lung field most likely is due to increasing right pleural effusion. Electronically Signed   By: Elmer Picker M.D.   On: 02/05/2023 12:51   ECHOCARDIOGRAM LIMITED  Result Date: 02/05/2023    ECHOCARDIOGRAM LIMITED REPORT   Patient Name:   RONDRICK SCHERGER Date of Exam: 02/05/2023 Medical Rec #:  [id number]      Height:       71.0 in Accession #:    [id number]     Weight:       174.2 lb Date of Birth:  02-Dec-1950      BSA:          1.988 m Patient Age:    72 years       BP:           116/64 mmHg Patient Gender: M              HR:           127 bpm. Exam Location:  Inpatient Procedure: Limited Echo and Limited Color Doppler STAT ECHO Indications:     Chest Pain  History:         Patient has prior history of Echocardiogram examinations, most                  recent 03/23/2020. Stroke, Signs/Symptoms:Dyspnea and Chest Pain;  Risk Factors:Hypertension and Dyslipidemia. Cancer, hx of                  radiation.  Sonographer:     Eartha Inch Referring Phys:  [id number] [place] J PATWARDHAN Diagnosing Phys: Vernell Leep MD IMPRESSIONS  1. Left ventricular ejection fraction, by estimation, is 55 to 60%. The left ventricle has normal function. The left ventricle has no regional wall motion abnormalities. Left ventricular diastolic function could not be evaluated.  2. Right ventricular systolic function is normal. The right ventricular size is normal.  3. The mitral valve is grossly  normal. Trivial mitral valve regurgitation. FINDINGS  Left Ventricle: Left ventricular ejection fraction, by estimation, is 55 to 60%. The left ventricle has normal function. The left ventricle has no regional wall motion abnormalities. The left ventricular internal cavity size was normal in size. There is  no left ventricular hypertrophy. Left ventricular diastolic function could not be evaluated. Right Ventricle: The right ventricular size is normal. No increase in right ventricular wall thickness. Right ventricular systolic function is normal. Pericardium: There is no evidence of pericardial effusion. Mitral Valve: The mitral valve is grossly normal. Trivial mitral valve regurgitation. Tricuspid Valve: The tricuspid valve is normal in structure. Tricuspid valve regurgitation is not demonstrated. No evidence of tricuspid stenosis. Aorta: The aortic root is normal in size and structure. Additional Comments: Color Doppler performed.  LEFT VENTRICLE PLAX 2D LVIDd:         4.30 cm LVIDs:         3.00 cm LV PW:         0.90 cm LV IVS:        1.00 cm LVOT diam:     2.20 cm LVOT Area:     3.80 cm  LEFT ATRIUM         Index LA diam:    3.20 cm 1.61 cm/m   AORTA Ao Root diam: 3.20 cm  SHUNTS Systemic Diam: 2.20 cm Vernell Leep MD Electronically signed by Vernell Leep MD Signature Date/Time: 02/05/2023/12:29:53 PM    Final    DG Chest Portable 1 View  Result Date: 02/05/2023 CLINICAL DATA:  Chest pain. EXAM: PORTABLE CHEST 1 VIEW COMPARISON:  Chest CT 02/02/2023. FINDINGS: Small right pleural effusion with adjacent right basilar atelectasis. Unchanged right-sided mediastinal and hilar lymphadenopathy. The left lung is clear. Stable cardiac and mediastinal contours. No pneumothorax. IMPRESSION: 1. Small right pleural effusion with adjacent right basilar atelectasis. 2. Unchanged right-sided mediastinal and hilar lymphadenopathy. Electronically Signed   By: Emmit Alexanders M.D.   On: 02/05/2023 11:44   CT  Chest W Contrast  Result Date: 02/03/2023 CLINICAL DATA:  Staging lung cancer on chemotherapy. * Tracking Code: BO * EXAM: CT CHEST, ABDOMEN, AND PELVIS WITH CONTRAST TECHNIQUE: Multidetector CT imaging of the chest, abdomen and pelvis was performed following the standard protocol during bolus administration of intravenous contrast. RADIATION DOSE REDUCTION: This exam was performed according to the departmental dose-optimization program which includes automated exposure control, adjustment of the mA and/or kV according to patient size and/or use of iterative reconstruction technique. CONTRAST:  124mL OMNIPAQUE IOHEXOL 300 MG/ML  SOLN COMPARISON:  CT angiogram chest and abdomen pelvis CT 12/12/2022. Older exams as well FINDINGS: CT CHEST FINDINGS Cardiovascular: Small pericardial effusion is increased from previous. The heart is nonenlarged. Coronary artery calcifications are seen. The thoracic aorta has some scattered partially calcified atherosclerotic plaque. Once again there is severe narrowing and poor enhancement of the right upper lobe pulmonary  artery in the mediastinum and hilum consistent with encasing soft tissue mass. There is also narrowing of the SVC mildly by mediastinal mass lesions. Again this was seen previously. Mediastinum/Nodes: Several abnormal lymph nodes are seen. There is a new left supraclavicular node at the thoracic inlet the level of the left thyroid lobe measuring 16 by 12 mm on series 2 image 3. No specific other abnormal node enlargement in the axillary region. Bilateral abnormal hilar nodes are identified. On the left there is an increasing node measuring 2.4 x 1.6 cm. Previously there is abnormal tissue in this location measuring 1.6 by 1.1 cm. Confluence abnormal soft tissue seen in the right side of the mediastinum. Area measured lower right paratracheal, precarinal on series 2, image 22 today measures 4.2 x 3.5 cm and previously 4.1 by 2.9 cm. Soft tissue nodule anterior to the  SVC brachiocephalic vein junction on series 2, image 19 today measures 19 by 18 mm and previously would have measured 2.1 x 2.1 cm. There is also an abnormal lymph node along the internal mammary chain which is increased. Today on series 2, image 25 this measures 17 x 14 mm and previously 13 x 10 mm. There is also increasing nodes along the right anterior cardiophrenic space on image 47 and 48 of series 2. Normal caliber thoracic esophagus. Lungs/Pleura: Left lung is grossly clear. No consolidation, pneumothorax or effusion. Only minimal dependent atelectasis. There is increasing now moderate right pleural effusion with compressive atelectasis in the right lower lobe and some punctate calcifications. There is some interstitial thickening along the right upper lobe decreasing ground-glass. There are developing pleural nodules identified. Example medial right lower lobe on series 2, image 46 measuring 3.0 by 2.0 cm. Areas of pleural thickening posteriorly on image 35 of series 2. Additional thickening more caudal on series 2, image 50. Musculoskeletal: There is also associated involvement of the posterior aspect of the right ninth rib. Likely related to direct invasion by the pleural soft tissue extending into the chest wall on series 2, image 37. There is also a healing fracture of the posterior aspect of the right eleventh rib CT ABDOMEN PELVIS FINDINGS Hepatobiliary: Small hepatic cysts are identified. Focal fat deposition seen in the liver adjacent to the falciform ligament. Patent portal vein. The gallbladder is somewhat distended but unchanged from previous. Pancreas: Unremarkable. No pancreatic ductal dilatation or surrounding inflammatory changes. Spleen: Normal in size without focal abnormality. Adrenals/Urinary Tract: Right adrenal gland is preserved. There are 2 left adrenal nodules which have increased in size. For example the more superior focus which previously measured 11 x 9 mm, today measures 18 by 13  mm. The other lesions also larger more caudal. Bosniak 1 bilateral renal cysts are stable. No specific follow-up. The ureters have normal course and caliber extending down to the bladder. Preserved contours of the urinary bladder. Stomach/Bowel: The large bowel has a normal course and caliber with diffuse colonic stool. Left-sided colonic diverticula. Normal appendix. The stomach and small bowel are nondilated except for a loop of small bowel in the midabdomen which has diameter of 3.3 cm. This has a is mildly dilated. No abrupt transition there is small bowel stool appearance. This could be related to slow transit. Vascular/Lymphatic: Diffuse irregular atherosclerotic plaque along the aorta and iliac vessels with areas of stenosis. Preserved IVC. There is new abnormal lymph node seen towards the gastrohepatic ligament on series 2, image 56 measuring 15 by 12 mm today and previously less than 5 mm in  short axis. Abnormal nodes seen retrocrural as well as posterior to the intrahepatic IVC on image 53. Reproductive: Prostate is unremarkable. Other: Mild anasarca.  No ascites. 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 There are destructive lytic bone lesions once again identified along the pelvis including the left iliac bone on image 95 and posterior medial right iliac bone such as image 91. These are increasing in size and extent. This there are also lesions developing along the lumbar spine. IMPRESSION: Overall progression of disease. 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 In addition there is one area which appears to extend posteriorly through the chest wall and involve the right ninth rib. Increased lymph nodes identified in the mediastinum, retrocrural, upper abdomen and left side thoracic inlet. 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 Once again there is a dilated gallbladder. If there is concern of  gallbladder pathology additional workup with ultrasound as clinically appropriate. Solitary loop of small bowel which is mildly dilated in the left midabdomen without a focal transition. Nonspecific. Findings will be called to the ordering service by the Radiology physician assistant team Electronically Signed   By: Jill Side M.D.   On: 02/03/2023 10:40   CT Abdomen Pelvis W Contrast  Result Date: 02/03/2023 CLINICAL DATA:  Staging lung cancer on chemotherapy. * Tracking Code: BO * EXAM: CT CHEST, ABDOMEN, AND PELVIS WITH CONTRAST TECHNIQUE: Multidetector CT imaging of the chest, abdomen and pelvis was performed following the standard protocol during bolus administration of intravenous contrast. RADIATION DOSE REDUCTION: This exam was performed according to the departmental dose-optimization program which includes automated exposure control, adjustment of the mA and/or kV according to patient size and/or use of iterative reconstruction technique. CONTRAST:  170mL OMNIPAQUE IOHEXOL 300 MG/ML  SOLN COMPARISON:  CT angiogram chest and abdomen pelvis CT 12/12/2022. Older exams as well FINDINGS: CT CHEST FINDINGS Cardiovascular: Small pericardial effusion is increased from previous. The heart is nonenlarged. Coronary artery calcifications are seen. The thoracic aorta has some scattered partially calcified atherosclerotic plaque. Once again there is severe narrowing and poor enhancement of the right upper lobe pulmonary artery in the mediastinum and hilum consistent with encasing soft tissue mass. There is also narrowing of the SVC mildly by mediastinal mass lesions. Again this was seen previously. Mediastinum/Nodes: Several abnormal lymph nodes are seen. There is a new left supraclavicular node at the thoracic inlet the level of the left thyroid lobe measuring 16 by 12 mm on series 2 image 3. No specific other abnormal node enlargement in the axillary region. Bilateral abnormal hilar nodes are identified. On the  left there is an increasing node measuring 2.4 x 1.6 cm. Previously there is abnormal tissue in this location measuring 1.6 by 1.1 cm. Confluence abnormal soft tissue seen in the right side of the mediastinum. Area measured lower right paratracheal, precarinal on series 2, image 22 today measures 4.2 x 3.5 cm and previously 4.1 by 2.9 cm. Soft tissue nodule anterior to the SVC brachiocephalic vein junction on series 2, image 19 today measures 19 by 18 mm and previously would have measured 2.1 x 2.1 cm. There is also an abnormal lymph node along the internal mammary chain which is increased. Today on series 2, image 25 this measures 17 x 14 mm and previously 13 x 10 mm. There is also increasing nodes along the right anterior cardiophrenic space on image 47 and 48 of series 2. Normal caliber thoracic esophagus. Lungs/Pleura: Left lung is grossly  clear. No consolidation, pneumothorax or effusion. Only minimal dependent atelectasis. There is increasing now moderate right pleural effusion with compressive atelectasis in the right lower lobe and some punctate calcifications. There is some interstitial thickening along the right upper lobe decreasing ground-glass. There are developing pleural nodules identified. Example medial right lower lobe on series 2, image 46 measuring 3.0 by 2.0 cm. Areas of pleural thickening posteriorly on image 35 of series 2. Additional thickening more caudal on series 2, image 50. Musculoskeletal: There is also associated involvement of the posterior aspect of the right ninth rib. Likely related to direct invasion by the pleural soft tissue extending into the chest wall on series 2, image 37. There is also a healing fracture of the posterior aspect of the right eleventh rib CT ABDOMEN PELVIS FINDINGS Hepatobiliary: Small hepatic cysts are identified. Focal fat deposition seen in the liver adjacent to the falciform ligament. Patent portal vein. The gallbladder is somewhat distended but unchanged  from previous. Pancreas: Unremarkable. No pancreatic ductal dilatation or surrounding inflammatory changes. Spleen: Normal in size without focal abnormality. Adrenals/Urinary Tract: Right adrenal gland is preserved. There are 2 left adrenal nodules which have increased in size. For example the more superior focus which previously measured 11 x 9 mm, today measures 18 by 13 mm. The other lesions also larger more caudal. Bosniak 1 bilateral renal cysts are stable. No specific follow-up. The ureters have normal course and caliber extending down to the bladder. Preserved contours of the urinary bladder. Stomach/Bowel: The large bowel has a normal course and caliber with diffuse colonic stool. Left-sided colonic diverticula. Normal appendix. The stomach and small bowel are nondilated except for a loop of small bowel in the midabdomen which has diameter of 3.3 cm. This has a is mildly dilated. No abrupt transition there is small bowel stool appearance. This could be related to slow transit. Vascular/Lymphatic: Diffuse irregular atherosclerotic plaque along the aorta and iliac vessels with areas of stenosis. Preserved IVC. There is new abnormal lymph node seen towards the gastrohepatic ligament on series 2, image 56 measuring 15 by 12 mm today and previously less than 5 mm in short axis. Abnormal nodes seen retrocrural as well as posterior to the intrahepatic IVC on image 53. Reproductive: Prostate is unremarkable. Other: Mild anasarca.  No ascites. 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 There are destructive lytic bone lesions once again identified along the pelvis including the left iliac bone on image 95 and posterior medial right iliac bone such as image 91. These are increasing in size and extent. This there are also lesions developing along the lumbar spine. IMPRESSION: Overall progression of disease. 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 In  addition there is one area which appears to extend posteriorly through the chest wall and involve the right ninth rib. Increased lymph nodes identified in the mediastinum, retrocrural, upper abdomen and left side thoracic inlet. 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 Once again there is a dilated gallbladder. If there is concern of gallbladder pathology additional workup with ultrasound as clinically appropriate. Solitary loop of small bowel which is mildly dilated in the left midabdomen without a focal transition. Nonspecific. Findings will be called to the ordering service by the Radiology physician assistant team Electronically Signed   By: Jill Side M.D.   On: 02/03/2023 10:40    Microbiology: No results found for this or any previous visit (from the past 240 hour(s)).  Time spent: <30 minutes  Signed: Karmen Bongo, MD 2023/03/10

## 2023-02-16 DEATH — deceased

## 2023-02-19 ENCOUNTER — Ambulatory Visit: Payer: Medicare Other | Admitting: Internal Medicine

## 2023-02-19 ENCOUNTER — Other Ambulatory Visit: Payer: Medicare Other

## 2023-02-23 ENCOUNTER — Ambulatory Visit: Payer: Medicare Other | Admitting: Cardiology

## 2023-02-26 ENCOUNTER — Inpatient Hospital Stay: Payer: Medicare Other

## 2023-02-26 ENCOUNTER — Inpatient Hospital Stay: Payer: Medicare Other | Admitting: Internal Medicine

## 2023-02-28 ENCOUNTER — Other Ambulatory Visit: Payer: Self-pay | Admitting: Internal Medicine

## 2023-03-19 ENCOUNTER — Ambulatory Visit: Payer: Medicare Other | Admitting: Physician Assistant

## 2023-03-19 ENCOUNTER — Ambulatory Visit: Payer: Medicare Other

## 2023-03-19 ENCOUNTER — Other Ambulatory Visit: Payer: Medicare Other

## 2023-03-19 ENCOUNTER — Other Ambulatory Visit: Payer: Self-pay | Admitting: Cardiology

## 2023-07-07 ENCOUNTER — Telehealth: Payer: Medicare Other | Admitting: Adult Health
